# Patient Record
Sex: Male | Born: 1935 | Race: White | Hispanic: No | Marital: Married | Smoking: Former smoker
Health system: Southern US, Community
[De-identification: ages and names within clinical notes are randomized; demographics above are authoritative.]

## PROBLEM LIST (undated history)

## (undated) DIAGNOSIS — K56609 Unspecified intestinal obstruction, unspecified as to partial versus complete obstruction: Secondary | ICD-10-CM

## (undated) DIAGNOSIS — IMO0001 Reserved for inherently not codable concepts without codable children: Secondary | ICD-10-CM

## (undated) DIAGNOSIS — I10 Essential (primary) hypertension: Secondary | ICD-10-CM

## (undated) DIAGNOSIS — I2510 Atherosclerotic heart disease of native coronary artery without angina pectoris: Secondary | ICD-10-CM

## (undated) DIAGNOSIS — H9190 Unspecified hearing loss, unspecified ear: Secondary | ICD-10-CM

## (undated) DIAGNOSIS — Z87442 Personal history of urinary calculi: Secondary | ICD-10-CM

## (undated) DIAGNOSIS — M1990 Unspecified osteoarthritis, unspecified site: Secondary | ICD-10-CM

## (undated) DIAGNOSIS — I4891 Unspecified atrial fibrillation: Secondary | ICD-10-CM

## (undated) DIAGNOSIS — H269 Unspecified cataract: Secondary | ICD-10-CM

## (undated) DIAGNOSIS — C4490 Unspecified malignant neoplasm of skin, unspecified: Secondary | ICD-10-CM

## (undated) DIAGNOSIS — Z86010 Personal history of colonic polyps: Secondary | ICD-10-CM

## (undated) DIAGNOSIS — E785 Hyperlipidemia, unspecified: Secondary | ICD-10-CM

## (undated) DIAGNOSIS — E119 Type 2 diabetes mellitus without complications: Secondary | ICD-10-CM

## (undated) HISTORY — DX: Atherosclerotic heart disease of native coronary artery without angina pectoris: I25.10

## (undated) HISTORY — DX: Unspecified cataract: H26.9

## (undated) HISTORY — PX: BACK SURGERY: SHX140

## (undated) HISTORY — DX: Type 2 diabetes mellitus without complications: E11.9

## (undated) HISTORY — DX: Personal history of urinary calculi: Z87.442

## (undated) HISTORY — PX: COLONOSCOPY: SHX174

## (undated) HISTORY — DX: Unspecified hearing loss, unspecified ear: H91.90

## (undated) HISTORY — DX: Unspecified atrial fibrillation: I48.91

## (undated) HISTORY — DX: Personal history of colonic polyps: Z86.010

## (undated) HISTORY — DX: Hyperlipidemia, unspecified: E78.5

## (undated) HISTORY — DX: Essential (primary) hypertension: I10

## (undated) HISTORY — DX: Unspecified osteoarthritis, unspecified site: M19.90

## (undated) HISTORY — DX: Unspecified malignant neoplasm of skin, unspecified: C44.90

## (undated) HISTORY — PX: TOTAL HIP ARTHROPLASTY: SHX124

---

## 1979-11-17 HISTORY — PX: TYMPANIC MEMBRANE REPAIR: SHX294

## 1997-11-05 ENCOUNTER — Encounter: Payer: Self-pay | Admitting: Internal Medicine

## 1998-06-18 ENCOUNTER — Emergency Department (HOSPITAL_COMMUNITY): Admission: EM | Admit: 1998-06-18 | Discharge: 1998-06-18 | Payer: Self-pay | Admitting: *Deleted

## 1999-02-05 ENCOUNTER — Emergency Department (HOSPITAL_COMMUNITY): Admission: EM | Admit: 1999-02-05 | Discharge: 1999-02-05 | Payer: Self-pay | Admitting: *Deleted

## 1999-02-05 ENCOUNTER — Encounter: Payer: Self-pay | Admitting: *Deleted

## 1999-02-25 ENCOUNTER — Emergency Department (HOSPITAL_COMMUNITY): Admission: EM | Admit: 1999-02-25 | Discharge: 1999-02-25 | Payer: Self-pay | Admitting: Emergency Medicine

## 1999-02-28 ENCOUNTER — Inpatient Hospital Stay (HOSPITAL_COMMUNITY): Admission: RE | Admit: 1999-02-28 | Discharge: 1999-03-01 | Payer: Self-pay | Admitting: Urology

## 1999-03-01 ENCOUNTER — Encounter: Payer: Self-pay | Admitting: Urology

## 1999-11-03 ENCOUNTER — Encounter: Admission: RE | Admit: 1999-11-03 | Discharge: 2000-02-01 | Payer: Self-pay | Admitting: Internal Medicine

## 2000-07-14 ENCOUNTER — Encounter: Payer: Self-pay | Admitting: Internal Medicine

## 2000-07-15 ENCOUNTER — Other Ambulatory Visit: Admission: RE | Admit: 2000-07-15 | Discharge: 2000-07-15 | Payer: Self-pay | Admitting: Gastroenterology

## 2002-06-20 ENCOUNTER — Encounter: Admission: RE | Admit: 2002-06-20 | Discharge: 2002-06-20 | Payer: Self-pay | Admitting: Internal Medicine

## 2002-06-20 ENCOUNTER — Encounter: Payer: Self-pay | Admitting: Internal Medicine

## 2002-07-18 ENCOUNTER — Encounter: Payer: Self-pay | Admitting: Neurosurgery

## 2002-07-20 ENCOUNTER — Encounter: Payer: Self-pay | Admitting: Neurosurgery

## 2002-07-20 ENCOUNTER — Ambulatory Visit (HOSPITAL_COMMUNITY): Admission: RE | Admit: 2002-07-20 | Discharge: 2002-07-21 | Payer: Self-pay | Admitting: Neurosurgery

## 2004-06-10 ENCOUNTER — Encounter: Admission: RE | Admit: 2004-06-10 | Discharge: 2004-06-10 | Payer: Self-pay | Admitting: Orthopedic Surgery

## 2004-07-09 ENCOUNTER — Encounter: Payer: Self-pay | Admitting: Gastroenterology

## 2004-07-09 LAB — HM COLONOSCOPY

## 2004-10-20 ENCOUNTER — Ambulatory Visit: Payer: Self-pay | Admitting: Internal Medicine

## 2004-12-22 ENCOUNTER — Ambulatory Visit: Payer: Self-pay | Admitting: Internal Medicine

## 2005-07-02 ENCOUNTER — Ambulatory Visit: Payer: Self-pay | Admitting: Internal Medicine

## 2006-02-23 ENCOUNTER — Emergency Department (HOSPITAL_COMMUNITY): Admission: EM | Admit: 2006-02-23 | Discharge: 2006-02-23 | Payer: Self-pay | Admitting: Emergency Medicine

## 2006-03-17 ENCOUNTER — Encounter: Payer: Self-pay | Admitting: Urology

## 2006-04-30 ENCOUNTER — Ambulatory Visit: Payer: Self-pay | Admitting: Internal Medicine

## 2006-09-30 ENCOUNTER — Ambulatory Visit: Payer: Self-pay | Admitting: Internal Medicine

## 2006-11-05 ENCOUNTER — Ambulatory Visit: Payer: Self-pay | Admitting: Internal Medicine

## 2006-11-05 LAB — CONVERTED CEMR LAB
ALT: 20 units/L (ref 0–40)
AST: 21 units/L (ref 0–37)
Albumin: 3.9 g/dL (ref 3.5–5.2)
Alkaline Phosphatase: 46 units/L (ref 39–117)
BUN: 16 mg/dL (ref 6–23)
Basophils Absolute: 0 10*3/uL (ref 0.0–0.1)
Basophils Relative: 0.5 % (ref 0.0–1.0)
Bilirubin, Direct: 0.1 mg/dL (ref 0.0–0.3)
CO2: 27 meq/L (ref 19–32)
Calcium: 9.4 mg/dL (ref 8.4–10.5)
Chloride: 104 meq/L (ref 96–112)
Chol/HDL Ratio, serum: 3.6
Cholesterol: 144 mg/dL (ref 0–200)
Creatinine, Ser: 1 mg/dL (ref 0.4–1.5)
Eosinophil percent: 2.3 % (ref 0.0–5.0)
GFR calc non Af Amer: 79 mL/min
Glomerular Filtration Rate, Af Am: 95 mL/min/{1.73_m2}
Glucose, Bld: 120 mg/dL — ABNORMAL HIGH (ref 70–99)
HCT: 42.6 % (ref 39.0–52.0)
HDL: 40.1 mg/dL (ref >39.0)
Hemoglobin: 14.2 g/dL (ref 13.0–17.0)
Hgb A1c MFr Bld: 6.1 % — ABNORMAL HIGH (ref 4.6–6.0)
LDL Cholesterol: 88 mg/dL (ref 0–99)
Lymphocytes Relative: 33.2 % (ref 12.0–46.0)
MCHC: 33.3 g/dL (ref 30.0–36.0)
MCV: 95.1 fL (ref 78.0–100.0)
Monocytes Absolute: 0.5 10*3/uL (ref 0.2–0.7)
Monocytes Relative: 10.3 % (ref 3.0–11.0)
Neutro Abs: 2.7 10*3/uL (ref 1.4–7.7)
Neutrophils Relative %: 53.7 % (ref 43.0–77.0)
PSA: 1.17 ng/mL (ref 0.10–4.00)
Platelets: 294 10*3/uL (ref 150–400)
Potassium: 4 meq/L (ref 3.5–5.1)
RBC: 4.48 M/uL (ref 4.22–5.81)
RDW: 11.6 % (ref 11.5–14.6)
Sodium: 138 meq/L (ref 135–145)
TSH: 0.8 microintl units/mL (ref 0.35–5.50)
Total Bilirubin: 0.9 mg/dL (ref 0.3–1.2)
Total Protein: 6.9 g/dL (ref 6.0–8.3)
Triglyceride fasting, serum: 78 mg/dL (ref 0–149)
VLDL: 16 mg/dL (ref 0–40)
WBC: 5 10*3/uL (ref 4.5–10.5)

## 2007-04-15 ENCOUNTER — Inpatient Hospital Stay (HOSPITAL_COMMUNITY): Admission: RE | Admit: 2007-04-15 | Discharge: 2007-04-19 | Payer: Self-pay | Admitting: Orthopedic Surgery

## 2007-04-26 DIAGNOSIS — I10 Essential (primary) hypertension: Secondary | ICD-10-CM

## 2007-04-26 DIAGNOSIS — Z87442 Personal history of urinary calculi: Secondary | ICD-10-CM

## 2007-04-26 DIAGNOSIS — Z860100 Personal history of colon polyps, unspecified: Secondary | ICD-10-CM

## 2007-04-26 DIAGNOSIS — E119 Type 2 diabetes mellitus without complications: Secondary | ICD-10-CM

## 2007-04-26 HISTORY — DX: Personal history of colon polyps, unspecified: Z86.0100

## 2007-04-26 HISTORY — DX: Personal history of urinary calculi: Z87.442

## 2007-04-26 HISTORY — DX: Type 2 diabetes mellitus without complications: E11.9

## 2007-04-26 HISTORY — DX: Personal history of colonic polyps: Z86.010

## 2007-04-26 HISTORY — DX: Essential (primary) hypertension: I10

## 2007-05-12 ENCOUNTER — Ambulatory Visit: Payer: Self-pay | Admitting: Internal Medicine

## 2007-06-30 ENCOUNTER — Ambulatory Visit: Payer: Self-pay | Admitting: Internal Medicine

## 2007-07-01 LAB — CONVERTED CEMR LAB
BUN: 16 mg/dL (ref 6–23)
CO2: 29 meq/L (ref 19–32)
Calcium: 9.4 mg/dL (ref 8.4–10.5)
Chloride: 107 meq/L (ref 96–112)
Creatinine, Ser: 0.9 mg/dL (ref 0.4–1.5)
GFR calc Af Amer: 107 mL/min
GFR calc non Af Amer: 89 mL/min
Glucose, Bld: 120 mg/dL — ABNORMAL HIGH (ref 70–99)
Hgb A1c MFr Bld: 5.9 % (ref 4.6–6.0)
Potassium: 4.4 meq/L (ref 3.5–5.1)
Sodium: 141 meq/L (ref 135–145)

## 2007-09-14 ENCOUNTER — Ambulatory Visit: Payer: Self-pay | Admitting: Internal Medicine

## 2008-07-09 ENCOUNTER — Encounter: Payer: Self-pay | Admitting: Internal Medicine

## 2008-12-18 ENCOUNTER — Ambulatory Visit: Payer: Self-pay | Admitting: Internal Medicine

## 2008-12-18 DIAGNOSIS — H9190 Unspecified hearing loss, unspecified ear: Secondary | ICD-10-CM

## 2008-12-18 HISTORY — DX: Unspecified hearing loss, unspecified ear: H91.90

## 2008-12-19 LAB — CONVERTED CEMR LAB
ALT: 26 units/L (ref 0–53)
Alkaline Phosphatase: 47 units/L (ref 39–117)
Basophils Absolute: 0 10*3/uL (ref 0.0–0.1)
Basophils Relative: 0.6 % (ref 0.0–3.0)
Bilirubin, Direct: 0.1 mg/dL (ref 0.0–0.3)
Calcium: 9.3 mg/dL (ref 8.4–10.5)
Creatinine, Ser: 0.9 mg/dL (ref 0.4–1.5)
Eosinophils Absolute: 0.1 10*3/uL (ref 0.0–0.7)
Eosinophils Relative: 2.1 % (ref 0.0–5.0)
HCT: 43.2 % (ref 39.0–52.0)
HDL: 33.5 mg/dL — ABNORMAL LOW (ref >39.0)
LDL Cholesterol: 91 mg/dL (ref 0–99)
MCHC: 34.5 g/dL (ref 30.0–36.0)
MCV: 94.5 fL (ref 78.0–100.0)
Monocytes Absolute: 0.8 10*3/uL (ref 0.1–1.0)
Neutrophils Relative %: 57.8 % (ref 43.0–77.0)
Platelets: 275 10*3/uL (ref 150–400)
Sodium: 141 meq/L (ref 135–145)
Total Protein: 7.3 g/dL (ref 6.0–8.3)
Triglycerides: 120 mg/dL (ref 0–149)

## 2008-12-31 ENCOUNTER — Encounter: Payer: Self-pay | Admitting: Internal Medicine

## 2009-05-16 ENCOUNTER — Encounter (INDEPENDENT_AMBULATORY_CARE_PROVIDER_SITE_OTHER): Payer: Self-pay | Admitting: *Deleted

## 2009-06-13 ENCOUNTER — Ambulatory Visit: Payer: Self-pay | Admitting: Internal Medicine

## 2009-06-13 LAB — CONVERTED CEMR LAB
ALT: 21 units/L (ref 0–53)
AST: 22 units/L (ref 0–37)
Albumin: 3.8 g/dL (ref 3.5–5.2)
Alkaline Phosphatase: 42 units/L (ref 39–117)
Bilirubin, Direct: 0.2 mg/dL (ref 0.0–0.3)
Total Bilirubin: 0.9 mg/dL (ref 0.3–1.2)
Total CHOL/HDL Ratio: 4
Total Protein: 6.9 g/dL (ref 6.0–8.3)
Triglycerides: 90 mg/dL (ref 0.0–149.0)
VLDL: 18 mg/dL (ref 0.0–40.0)

## 2009-06-24 ENCOUNTER — Ambulatory Visit: Payer: Self-pay | Admitting: Gastroenterology

## 2009-07-01 ENCOUNTER — Ambulatory Visit: Payer: Self-pay | Admitting: Internal Medicine

## 2009-07-16 ENCOUNTER — Ambulatory Visit: Payer: Self-pay | Admitting: Gastroenterology

## 2009-07-16 ENCOUNTER — Encounter: Payer: Self-pay | Admitting: Gastroenterology

## 2009-07-18 ENCOUNTER — Encounter: Payer: Self-pay | Admitting: Gastroenterology

## 2009-12-25 ENCOUNTER — Ambulatory Visit: Payer: Self-pay | Admitting: Internal Medicine

## 2009-12-25 LAB — CONVERTED CEMR LAB
Albumin: 4 g/dL (ref 3.5–5.2)
Alkaline Phosphatase: 51 units/L (ref 39–117)
BUN: 18 mg/dL (ref 6–23)
CO2: 30 meq/L (ref 19–32)
Calcium: 9.3 mg/dL (ref 8.4–10.5)
Creatinine, Ser: 1 mg/dL (ref 0.4–1.5)
GFR calc non Af Amer: 77.87 mL/min (ref >60)
HDL: 45 mg/dL (ref >39.00)
Sodium: 140 meq/L (ref 135–145)
Total Bilirubin: 0.5 mg/dL (ref 0.3–1.2)
Total CHOL/HDL Ratio: 3

## 2010-01-07 ENCOUNTER — Ambulatory Visit: Payer: Self-pay | Admitting: Internal Medicine

## 2010-01-07 DIAGNOSIS — C4490 Unspecified malignant neoplasm of skin, unspecified: Secondary | ICD-10-CM | POA: Insufficient documentation

## 2010-01-07 DIAGNOSIS — D485 Neoplasm of uncertain behavior of skin: Secondary | ICD-10-CM | POA: Insufficient documentation

## 2010-01-07 HISTORY — DX: Unspecified malignant neoplasm of skin, unspecified: C44.90

## 2010-06-16 LAB — HM DIABETES FOOT EXAM: HM Diabetic Foot Exam: NORMAL

## 2010-07-01 ENCOUNTER — Ambulatory Visit: Payer: Self-pay | Admitting: Internal Medicine

## 2010-07-01 LAB — CONVERTED CEMR LAB
ALT: 22 units/L (ref 0–53)
Albumin: 4 g/dL (ref 3.5–5.2)
Alkaline Phosphatase: 43 units/L (ref 39–117)
CO2: 28 meq/L (ref 19–32)
Chloride: 107 meq/L (ref 96–112)
Cholesterol: 140 mg/dL (ref 0–200)
Creatinine, Ser: 0.9 mg/dL (ref 0.4–1.5)
Potassium: 4.2 meq/L (ref 3.5–5.1)
Total CHOL/HDL Ratio: 4

## 2010-07-08 ENCOUNTER — Ambulatory Visit: Payer: Self-pay | Admitting: Internal Medicine

## 2010-09-19 ENCOUNTER — Encounter: Payer: Self-pay | Admitting: Internal Medicine
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
## 2010-12-18 NOTE — Letter (Signed)
Summary: [facility]  [facility]   Imported By: Sherian Rein 10/15/2010 11:39:18  _____________________________________________________________________  External Attachment:    Type:   Image     Comment:   External Document
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
## 2010-12-26 ENCOUNTER — Other Ambulatory Visit (INDEPENDENT_AMBULATORY_CARE_PROVIDER_SITE_OTHER): Payer: Medicare Other | Admitting: Internal Medicine

## 2010-12-26 DIAGNOSIS — Z0000 Encounter for general adult medical examination without abnormal findings: Secondary | ICD-10-CM

## 2010-12-26 DIAGNOSIS — Z125 Encounter for screening for malignant neoplasm of prostate: Secondary | ICD-10-CM

## 2010-12-26 DIAGNOSIS — E119 Type 2 diabetes mellitus without complications: Secondary | ICD-10-CM

## 2010-12-26 DIAGNOSIS — I10 Essential (primary) hypertension: Secondary | ICD-10-CM

## 2010-12-26 LAB — CBC WITH DIFFERENTIAL/PLATELET
Eosinophils Absolute: 0.1 10*3/uL (ref 0.0–0.7)
HCT: 42.5 % (ref 39.0–52.0)
Hemoglobin: 14.8 g/dL (ref 13.0–17.0)
Lymphs Abs: 2 10*3/uL (ref 0.7–4.0)
MCHC: 34.7 g/dL (ref 30.0–36.0)
Monocytes Absolute: 0.5 10*3/uL (ref 0.1–1.0)
Monocytes Relative: 8.8 % (ref 3.0–12.0)
Neutro Abs: 3.3 10*3/uL (ref 1.4–7.7)
Neutrophils Relative %: 54.8 % (ref 43.0–77.0)
Platelets: 244 10*3/uL (ref 150.0–400.0)
RBC: 4.59 Mil/uL (ref 4.22–5.81)
RDW: 12.9 % (ref 11.5–14.6)

## 2010-12-26 LAB — POCT URINALYSIS DIPSTICK
Leukocytes, UA: NEGATIVE
Nitrite, UA: NEGATIVE
pH, UA: 6.5

## 2010-12-26 LAB — TSH: TSH: 1.32 u[IU]/mL (ref 0.35–5.50)

## 2010-12-26 LAB — HEPATIC FUNCTION PANEL
AST: 20 U/L (ref 0–37)
Alkaline Phosphatase: 42 U/L (ref 39–117)
Bilirubin, Direct: 0.1 mg/dL (ref 0.0–0.3)
Total Bilirubin: 0.7 mg/dL (ref 0.3–1.2)
Total Protein: 6.6 g/dL (ref 6.0–8.3)

## 2010-12-26 LAB — LIPID PANEL
HDL: 42 mg/dL (ref >39.00)
VLDL: 19.2 mg/dL (ref 0.0–40.0)

## 2010-12-26 LAB — BASIC METABOLIC PANEL
Calcium: 9.2 mg/dL (ref 8.4–10.5)
Creatinine, Ser: 0.9 mg/dL (ref 0.4–1.5)
GFR: 87.69 mL/min (ref >60.00)
Potassium: 4.5 mEq/L (ref 3.5–5.1)

## 2011-01-01 ENCOUNTER — Encounter: Payer: Self-pay | Admitting: Internal Medicine

## 2011-01-02 ENCOUNTER — Encounter: Payer: Self-pay | Admitting: Internal Medicine

## 2011-01-02 ENCOUNTER — Ambulatory Visit (INDEPENDENT_AMBULATORY_CARE_PROVIDER_SITE_OTHER): Payer: Medicare Other | Admitting: Internal Medicine

## 2011-01-02 DIAGNOSIS — I10 Essential (primary) hypertension: Secondary | ICD-10-CM

## 2011-01-02 DIAGNOSIS — E119 Type 2 diabetes mellitus without complications: Secondary | ICD-10-CM

## 2011-01-02 DIAGNOSIS — Z0000 Encounter for general adult medical examination without abnormal findings: Secondary | ICD-10-CM

## 2011-01-02 DIAGNOSIS — R079 Chest pain, unspecified: Secondary | ICD-10-CM | POA: Insufficient documentation

## 2011-01-02 MED ORDER — LISINOPRIL 20 MG PO TABS: 20.0000 mg | ORAL_TABLET | Freq: Every day | ORAL | Status: DC

## 2011-01-02 NOTE — Assessment & Plan Note (Signed)
Well controlled Continue same meds 

## 2011-01-02 NOTE — Progress Notes (Signed)
  Subjective:    Patient ID: [name], male    DOB: Jul 23, 1935, 76 y.o.   MRN: [id number]  HPI  cpx  Past Medical History  Diagnosis Date  . CARCINOMA, SKIN, SQUAMOUS CELL 01/07/2010  . COLONIC POLYPS, HX OF 04/26/2007  . DIABETES MELLITUS, TYPE II 04/26/2007  . HYPERTENSION 04/26/2007  . NEPHROLITHIASIS, HX OF 04/26/2007  . Unspecified hearing loss 12/18/2008   Past Surgical History  Procedure Date  . Tympanic membrane repair   . Back surgery   . Total hip arthroplasty     left on 04/15/07    reports that he quit smoking about 30 years ago. He does not have any smokeless tobacco history on file. He reports that he does not drink alcohol. His drug history not on file. family history includes Breast cancer in his sister; Cancer in his brother and sister; Heart disease in his brother and father; and Heart failure in his mother.      Review of Systems   patient denies chest pain, shortness of breath, orthopnea. Denies lower extremity edema, abdominal pain, change in appetite, change in bowel movements. Patient denies rashes, musculoskeletal complaints. No other specific complaints in a complete review of systems.      Objective:   Physical Exam  Well-developed male in no acute distress. HEENT exam atraumatic, normocephalic, extraocular muscles are intact. Conjunctivae are pink without exudate. Neck is supple without lymphadenopathy, thyromegaly, jugular venous distention. Chest is clear to auscultation without increased work of breathing. Cardiac exam S1-S2 are regular. The PMI is normal. No significant murmurs or gallops. Abdominal exam active bowel sounds, soft, nontender. No abdominal bruits. Extremities no clubbing cyanosis or edema. Peripheral pulses are normal without bruits. Neurologic exam alert and oriented without any motor or sensory deficits. Rectal exam normal tone prostate normal size without masses or asymmetry.        Assessment & Plan:

## 2011-01-02 NOTE — Assessment & Plan Note (Addendum)
New problem. Patient reports exertional chest discomfort with dyspnea. This is been ongoing for several months might be slightly worsening. He has multiple risk factors for coronary disease including age, hypertension, diet-controlled diabetes. He clearly needs further evaluation. We'll set up for a stress Myoview.  recent extensive evaluation.

## 2011-01-02 NOTE — Assessment & Plan Note (Signed)
Reasonable control on no meds No need for treatment at this time

## 2011-01-07 ENCOUNTER — Telehealth (INDEPENDENT_AMBULATORY_CARE_PROVIDER_SITE_OTHER): Payer: Self-pay | Admitting: Radiology

## 2011-01-08 ENCOUNTER — Encounter: Payer: Self-pay | Admitting: Cardiology

## 2011-01-08 ENCOUNTER — Encounter (HOSPITAL_COMMUNITY): Payer: Medicare Other

## 2011-01-08 ENCOUNTER — Ambulatory Visit (HOSPITAL_COMMUNITY): Payer: Medicare Other | Attending: Internal Medicine

## 2011-01-08 DIAGNOSIS — I4949 Other premature depolarization: Secondary | ICD-10-CM

## 2011-01-08 DIAGNOSIS — R079 Chest pain, unspecified: Secondary | ICD-10-CM | POA: Insufficient documentation

## 2011-01-08 DIAGNOSIS — R0989 Other specified symptoms and signs involving the circulatory and respiratory systems: Secondary | ICD-10-CM

## 2011-01-08 DIAGNOSIS — I491 Atrial premature depolarization: Secondary | ICD-10-CM

## 2011-01-09 ENCOUNTER — Institutional Professional Consult (permissible substitution) (INDEPENDENT_AMBULATORY_CARE_PROVIDER_SITE_OTHER): Payer: Medicare Other | Admitting: Internal Medicine

## 2011-01-09 ENCOUNTER — Encounter: Payer: Self-pay | Admitting: Internal Medicine

## 2011-01-09 ENCOUNTER — Other Ambulatory Visit: Payer: Self-pay | Admitting: Internal Medicine

## 2011-01-09 DIAGNOSIS — R079 Chest pain, unspecified: Secondary | ICD-10-CM

## 2011-01-09 DIAGNOSIS — R0789 Other chest pain: Secondary | ICD-10-CM

## 2011-01-09 LAB — PROTIME-INR
INR: 1 ratio (ref 0.8–1.0)
Prothrombin Time: 11.2 s (ref 10.2–12.4)

## 2011-01-09 LAB — CBC WITH DIFFERENTIAL/PLATELET
Basophils Absolute: 0 10*3/uL (ref 0.0–0.1)
Basophils Relative: 0.3 % (ref 0.0–3.0)
Eosinophils Absolute: 0.1 10*3/uL (ref 0.0–0.7)
Eosinophils Relative: 1.2 % (ref 0.0–5.0)
Lymphs Abs: 1.9 10*3/uL (ref 0.7–4.0)
MCHC: 35.1 g/dL (ref 30.0–36.0)
Monocytes Relative: 7.8 % (ref 3.0–12.0)
Neutrophils Relative %: 73.1 % (ref 43.0–77.0)
RDW: 12.9 % (ref 11.5–14.6)

## 2011-01-09 LAB — BASIC METABOLIC PANEL: Sodium: 139 mEq/L (ref 135–145)

## 2011-01-12 ENCOUNTER — Inpatient Hospital Stay (HOSPITAL_COMMUNITY): Payer: Medicare Other

## 2011-01-12 ENCOUNTER — Encounter: Payer: Self-pay | Admitting: Cardiovascular Disease

## 2011-01-12 ENCOUNTER — Inpatient Hospital Stay (HOSPITAL_COMMUNITY)
Admission: RE | Admit: 2011-01-12 | Discharge: 2011-01-19 | DRG: 234 | Disposition: A | Discharge status: Home / Self Care | Payer: Medicare Other | Source: Ambulatory Visit | Attending: Thoracic Surgery (Cardiothoracic Vascular Surgery) | Admitting: Thoracic Surgery (Cardiothoracic Vascular Surgery)

## 2011-01-12 DIAGNOSIS — I2510 Atherosclerotic heart disease of native coronary artery without angina pectoris: Secondary | ICD-10-CM

## 2011-01-12 DIAGNOSIS — D62 Acute posthemorrhagic anemia: Secondary | ICD-10-CM | POA: Diagnosis not present

## 2011-01-12 DIAGNOSIS — Z860100 Personal history of colon polyps, unspecified: Secondary | ICD-10-CM

## 2011-01-12 DIAGNOSIS — Z96649 Presence of unspecified artificial hip joint: Secondary | ICD-10-CM

## 2011-01-12 DIAGNOSIS — I4891 Unspecified atrial fibrillation: Secondary | ICD-10-CM | POA: Diagnosis not present

## 2011-01-12 DIAGNOSIS — Z01810 Encounter for preprocedural cardiovascular examination: Secondary | ICD-10-CM

## 2011-01-12 DIAGNOSIS — Z87891 Personal history of nicotine dependence: Secondary | ICD-10-CM

## 2011-01-12 DIAGNOSIS — E875 Hyperkalemia: Secondary | ICD-10-CM | POA: Diagnosis not present

## 2011-01-12 DIAGNOSIS — R197 Diarrhea, unspecified: Secondary | ICD-10-CM | POA: Diagnosis not present

## 2011-01-12 DIAGNOSIS — K5900 Constipation, unspecified: Secondary | ICD-10-CM | POA: Diagnosis not present

## 2011-01-12 DIAGNOSIS — I2582 Chronic total occlusion of coronary artery: Secondary | ICD-10-CM | POA: Diagnosis present

## 2011-01-12 DIAGNOSIS — K560 Paralytic ileus: Secondary | ICD-10-CM | POA: Diagnosis not present

## 2011-01-12 DIAGNOSIS — E119 Type 2 diabetes mellitus without complications: Secondary | ICD-10-CM | POA: Diagnosis present

## 2011-01-12 DIAGNOSIS — J9383 Other pneumothorax: Secondary | ICD-10-CM | POA: Diagnosis not present

## 2011-01-12 HISTORY — PX: CORONARY ARTERY BYPASS GRAFT: SHX141

## 2011-01-12 LAB — COMPREHENSIVE METABOLIC PANEL
BUN: 13 mg/dL (ref 6–23)
CO2: 27 mEq/L (ref 19–32)
Calcium: 8.8 mg/dL (ref 8.4–10.5)
Chloride: 104 mEq/L (ref 96–112)
Creatinine, Ser: 0.91 mg/dL (ref 0.4–1.5)
GFR calc non Af Amer: >60 mL/min (ref >60)
Glucose, Bld: 134 mg/dL — ABNORMAL HIGH (ref 70–99)
Total Bilirubin: 0.7 mg/dL (ref 0.3–1.2)

## 2011-01-12 LAB — URINALYSIS, ROUTINE W REFLEX MICROSCOPIC
Bilirubin Urine: NEGATIVE
Ketones, ur: NEGATIVE mg/dL
Nitrite: NEGATIVE
Protein, ur: NEGATIVE mg/dL
Urobilinogen, UA: 0.2 mg/dL (ref 0.0–1.0)
pH: 6.5 (ref 5.0–8.0)

## 2011-01-12 LAB — POCT I-STAT 3, ART BLOOD GAS (G3+)
Acid-base deficit: 1 mmol/L (ref 0.0–2.0)
O2 Saturation: 100 %
O2 Saturation: 95 %
O2 Saturation: 98 %
Patient temperature: 35.4
TCO2: 23 mmol/L (ref 0–100)
TCO2: 23 mmol/L (ref 0–100)
pCO2 arterial: 39.6 mmHg (ref 35.0–45.0)
pCO2 arterial: 38.4 mmHg (ref 35.0–45.0)
pO2, Arterial: 106 mmHg — ABNORMAL HIGH (ref 80.0–100.0)

## 2011-01-12 LAB — SURGICAL PCR SCREEN
MRSA, PCR: NEGATIVE
Staphylococcus aureus: NEGATIVE

## 2011-01-12 LAB — BLOOD GAS, ARTERIAL
O2 Saturation: 97 %
TCO2: 23.8 mmol/L (ref 0–100)
pCO2 arterial: 34.4 mmHg — ABNORMAL LOW (ref 35.0–45.0)
pH, Arterial: 7.436 (ref 7.350–7.450)
pO2, Arterial: 87.7 mmHg (ref 80.0–100.0)

## 2011-01-12 LAB — CBC
HCT: 40.3 % (ref 39.0–52.0)
MCH: 31.4 pg (ref 26.0–34.0)
MCH: 30.8 pg (ref 26.0–34.0)
MCHC: 35.2 g/dL (ref 30.0–36.0)
MCV: 89.2 fL (ref 78.0–100.0)
MCV: 88 fL (ref 78.0–100.0)
Platelets: 153 10*3/uL (ref 150–400)
RBC: 4.52 MIL/uL (ref 4.22–5.81)
RDW: 12.4 % (ref 11.5–15.5)

## 2011-01-12 LAB — LIPID PANEL
Triglycerides: 134 mg/dL (ref <150)
VLDL: 27 mg/dL (ref 0–40)

## 2011-01-12 LAB — HEMOGLOBIN A1C: Hgb A1c MFr Bld: 7.1 % — ABNORMAL HIGH (ref <5.7)

## 2011-01-12 LAB — HEMOGLOBIN AND HEMATOCRIT, BLOOD
HCT: 25.6 % — ABNORMAL LOW (ref 39.0–52.0)
Hemoglobin: 9.1 g/dL — ABNORMAL LOW (ref 13.0–17.0)

## 2011-01-12 LAB — PROTIME-INR: Prothrombin Time: 12.9 seconds (ref 11.6–15.2)

## 2011-01-12 LAB — POCT I-STAT 4, (NA,K, GLUC, HGB,HCT)
Glucose, Bld: 104 mg/dL — ABNORMAL HIGH (ref 70–99)
Glucose, Bld: 122 mg/dL — ABNORMAL HIGH (ref 70–99)
HCT: 26 % — ABNORMAL LOW (ref 39.0–52.0)
HCT: 26 % — ABNORMAL LOW (ref 39.0–52.0)
Hemoglobin: 12.9 g/dL — ABNORMAL LOW (ref 13.0–17.0)
Hemoglobin: 8.8 g/dL — ABNORMAL LOW (ref 13.0–17.0)
Potassium: 4.2 mEq/L (ref 3.5–5.1)
Potassium: 3.7 mEq/L (ref 3.5–5.1)
Sodium: 137 mEq/L (ref 135–145)
Sodium: 135 mEq/L (ref 135–145)
Sodium: 143 mEq/L (ref 135–145)

## 2011-01-12 LAB — GLUCOSE, CAPILLARY: Glucose-Capillary: 139 mg/dL — ABNORMAL HIGH (ref 70–99)

## 2011-01-12 LAB — ABO/RH: ABO/RH(D): A NEG

## 2011-01-12 LAB — APTT: aPTT: 31 seconds (ref 24–37)

## 2011-01-13 ENCOUNTER — Inpatient Hospital Stay (HOSPITAL_COMMUNITY): Payer: Medicare Other

## 2011-01-13 LAB — GLUCOSE, CAPILLARY
Glucose-Capillary: 144 mg/dL — ABNORMAL HIGH (ref 70–99)
Glucose-Capillary: 209 mg/dL — ABNORMAL HIGH (ref 70–99)
Glucose-Capillary: 187 mg/dL — ABNORMAL HIGH (ref 70–99)

## 2011-01-13 LAB — CBC
HCT: 32.1 % — ABNORMAL LOW (ref 39.0–52.0)
Hemoglobin: 11.4 g/dL — ABNORMAL LOW (ref 13.0–17.0)
MCH: 30.3 pg (ref 26.0–34.0)
MCHC: 35.4 g/dL (ref 30.0–36.0)
MCHC: 34.3 g/dL (ref 30.0–36.0)
MCV: 88.4 fL (ref 78.0–100.0)
Platelets: 176 10*3/uL (ref 150–400)
RDW: 12.3 % (ref 11.5–15.5)
RDW: 12.6 % (ref 11.5–15.5)

## 2011-01-13 LAB — BASIC METABOLIC PANEL
Calcium: 8.3 mg/dL — ABNORMAL LOW (ref 8.4–10.5)
GFR calc Af Amer: >60 mL/min (ref >60)
GFR calc non Af Amer: >60 mL/min (ref >60)
Potassium: 4.1 mEq/L (ref 3.5–5.1)
Sodium: 136 mEq/L (ref 135–145)

## 2011-01-13 LAB — CREATININE, SERUM: GFR calc non Af Amer: >60 mL/min (ref >60)

## 2011-01-13 NOTE — Progress Notes (Signed)
Summary: nuc pre-procedure  Phone Note Outgoing Call   Call placed by: Harlow Asa CNMT Call placed to: Patient Reason for Call: Confirm/change Appt Summary of Call: Left message with information on Myoview Information Sheet (see scanned document for details).      Nuclear Med Background Indications for Stress Test: Evaluation for Ischemia     Symptoms: Chest Pain with Exertion, DOE    Nuclear Pre-Procedure Cardiac Risk Factors: Family History - CAD, History of Smoking, Hypertension, NIDDM Height (in): 67 Tech Comments: Diet controlled

## 2011-01-13 NOTE — Assessment & Plan Note (Signed)
Summary: myoview/dx.chest pain/dr.swords per terri [id number]  Nuclear Med Background Indications for Stress Test: Evaluation for Ischemia     Symptoms: Chest Pain with Exertion, Chest Pressure with Exertion, DOE, Palpitations, SOB    Nuclear Pre-Procedure Cardiac Risk Factors: Family History - CAD, History of Smoking, Hypertension, NIDDM Caffeine/Decaff Intake: None NPO After: 7:30 AM Lungs: clear IV 0.9% NS with Angio Cath: 18g     IV Site: R Antecubital IV Started by: Stanton Kidney, EMT-P Chest Size (in) 46     Height (in): 67 Weight (lb): 222 BMI: 34.90 Tech Comments: The patient struggled to get through 3 minutes on the treadmill. He experienced chest pressure/pain with sob, and EKG changes.  Nuclear Med Study 1 or 2 day study:  1 day     Stress Test Type:  Stress Reading MD:  Willa Rough, MD     Referring MD:  B.Swords Resting Radionuclide:  Technetium 72m Tetrofosmin     Resting Radionuclide Dose:  10.8 mCi  Stress Radionuclide:  Technetium 4m Tetrofosmin     Stress Radionuclide Dose:  33 mCi   Stress Protocol Exercise Time (min):  3:00 min     Max HR:  136 bpm     Predicted Max HR:  147 bpm  Max Systolic BP: 151 mm Hg     Percent Max HR:  92.52 %     METS: 4.6 Rate Pressure Product:  14782    Stress Test Technologist:  Milana Na, EMT-P     Nuclear Technologist:  Domenic Polite, CNMT  Rest Procedure  Myocardial perfusion imaging was performed at rest 45 minutes following the intravenous administration of Technetium 20m Tetrofosmin.  Stress Procedure  The patient exercised for 3:00. The patient stopped due to fatigue, sob, and chest pain/pressure.  There were + significant ST-T wave changes and occ pvcs/pacs.  Technetium 30m Tetrofosmin was injected at peak exercise and myocardial perfusion imaging was performed after a brief delay.  QPS Raw Data Images:  Normal; no motion artifact; normal heart/lung ratio. Stress Images:  Mild decreased activity at the  apical cap. Rest Images:  Normal homogeneous uptake in all areas of the myocardium. Subtraction (SDS):  Reversibility at the apical cap Transient Ischemic Dilatation:  1.12  (Normal <1.22)  Lung/Heart Ratio:  .13  (Normal <0.45)  Quantitative Gated Spect Images QGS EDV:  102 ml QGS ESV:  50 ml QGS EF:  51 % QGS cine images:  Mild hypokinesis at the apex  Findings Abnormal      Overall Impression  Exercise Capacity: Limited BP Response: Normal blood pressure response. Clinical Symptoms: Chest pressure ECG Impression: Nonspecific ST changes with stress Overall Impression Comments: The patient exercised only 3 minutes. He had chest pressure and SOB. There were nonspecific ST changes. There is a small reversible defect at the aex. My impression is that this probably does represent some ischemia.

## 2011-01-13 NOTE — Letter (Signed)
Summary: Cardiac Catheterization Instructions- Main Lab  [facility], Main Office  [address]   [place], [place] [address]   Phone: [phone]  Fax: [phone]     01/09/2011 MRN: [id number]  [facility] [address] [place], [place]  [address]  [place]  Dear Mr. Parson,   You are scheduled for Cardiac Catheterization on               with Dr.            .  Please arrive at the [facility] at 530      a.m.Marland Kitchen on the day of your procedure.  1. DIET     _x___ Nothing to eat or drink after midnight except your medications with a sip of water.   3. MAKE SURE YOU TAKE YOUR ASPIRIN.  4. _____ DO NOT TAKE these medications before your procedure:         ________________________________________________________________________________      __x__ YOU MAY TAKE ALL of your remaining medications with a small amount of water.      ____ START NEW medications:     ________________________________________________________________________________      ____ Eilene Ghazi instructions:     ________________________________________________________________________________  5. Plan for one night stay - bring personal belongings (i.e. toothpaste, toothbrush, etc.)  6. Bring a current list of your medications and current insurance cards.  7. Must have a responsible person to drive you home.   8. Someone must be with yu for the first 24 hours after you arrive home.  9. Please wear clothes that are easy to get on and off and wear slip-on shoes.  *Special note: Every effort is made to have your procedure done on time.  Occasionally there are emergencies that present themselves at the hospital that may cause delays.  Please be patient if a delay does occur.  If you have any questions after you get home, please call the office at the number listed above.  Layne Benton, RN, BSN

## 2011-01-13 NOTE — Consult Note (Signed)
NAMESEAMUS, [name]           ACCOUNT NO.:  [id number]  MEDICAL RECORD NO.:  [id number]           PATIENT TYPE:  O  LOCATION:  [facility]                         FACILITY:  [facility]  PHYSICIAN:  Salvatore Decent. Cornelius Moras, M.D. DATE OF BIRTH:  10-20-1935  DATE OF CONSULTATION:  01/12/2011 DATE OF DISCHARGE:                                CONSULTATION   REQUESTING PHYSICIAN:  Verne Carrow, MD  PRIMARY CARDIOLOGIST:  Pricilla Riffle, MD, [facility]  PRIMARY CARE PHYSICIAN:  Valetta Mole. Swords, MD  REASON FOR CONSULTATION:  Critical left main coronary artery disease.  HISTORY OF PRESENT ILLNESS:  [name] is a 76 year old gentleman from [place] with no previous history of coronary artery disease, but risk factors notable for history of hypertension and a remote history of tobacco use.  The patient describes a 1-year history of progressive symptoms of exertional angina.  He denies any rest pain other than occasional rare episodes of very transient mild discomfort.  Symptoms are characterized as tightness across his chest that radiates to the neck and is associated with shortness of breath.  Symptoms are always relieved within a minute or two of rest.  He was referred for a stress test that was notable in that the patient could only walk 3 minutes before he developed chest pain.  There were no EKG changes, but Myoview scan revealed apical ischemia.  The patient was brought in for elective cardiac catheterization this morning by Dr. Clifton James.  Catheterization demonstrates critical subtotal occlusion of the left main coronary artery.  There was normal left ventricular systolic function with mild hypokinesis and ejection fraction estimated at 45%.  Cardiothoracic Surgical consultation was requested.  REVIEW OF SYSTEMS:  GENERAL:  The patient reports normal appetite.  He has not been gaining nor losing weight recently.  CARDIAC:  The patient describes classical symptoms of exertional angina  as noted.  The patient denies nocturnal angina.  The patient denies resting shortness of breath, PND, orthopnea, or lower extremity edema.  The patient has not had tachy palpitations or syncope.  RESPIRATORY:  Notable only for intermittent cough that is occasionally productive of a small amount of whitish phlegm.  The patient denies any purulent sputum production or hemoptysis.  GASTROINTESTINAL:  Notable for occasional symptoms of reflux and mild difficulty swallowing.  The patient has never had any severe difficulty swallowing.  He reports normal bowel function and denies hematochezia, hematemesis, melena.  MUSCULOSKELETAL:  Notable for mild arthritis and arthralgias that do not seem to slow him down much. NEUROLOGIC:  Negative.  The patient denies symptoms suggestive of previous TIA or stroke.  GENITOURINARY:  Negative.  HEENT:  Negative. INFECTIOUS:  Negative.  PAST MEDICAL HISTORY: 1. Hypertension. 2. Type 2 diabetes mellitus, diet controlled. 3. Nephrolithiasis. 4. Benign colon polyps.  PAST SURGICAL HISTORY: 1. Left total hip replacement. 2. Lumbar laminectomy and diskectomy. 3. Repair of ruptured tympanic membrane in the distant past.  FAMILY HISTORY:  Noncontributory.  Notable for the absence of premature coronary artery disease.  SOCIAL HISTORY:  The patient is married and lives with his wife here in [place].  He is retired.  He has a remote  history of tobacco use, but he quit smoking 30 years ago.  He does not use alcohol.  MEDICATIONS PRIOR TO ADMISSION: 1. Aspirin 81 mg daily. 2. Lisinopril 20 mg daily. 3. Triamcinolone acetonide cream apply to affected areas as needed.  DRUG ALLERGIES:  None known.  PHYSICAL EXAMINATION:  GENERAL:  The patient is well-appearing, moderately obese male, who appears his stated age in no acute distress. HEENT:  Unrevealing. NECK:  Supple.  There is no cervical nor supraclavicular lymphadenopathy.  There is no jugular venous  distention.  No carotid bruits are noted. CHEST:  Auscultation of the chest demonstrates clear breath sounds that are symmetrical anteriorly.  No wheezes, rales, or rhonchi are noted. CARDIOVASCULAR:  Regular rate and rhythm.  No murmurs, rubs, or gallops are noted.  ABDOMEN:  Soft, nondistended, nontender.  Bowel sounds present. EXTREMITIES:  Warm and well perfused.  There is no lower extremity edema.  Distal pulses are palpable, but slightly diminished in the posterior tibial position.  There is no sign of venous insufficiency. RECTAL AND GU:  Both deferred. NEUROLOGIC:  Grossly nonfocal.  DIAGNOSTIC TESTS:  Cardiac catheterization performed by Dr. Clifton James earlier today is reviewed.  This demonstrates subtotal occlusion of the left main coronary artery, which is heavily calcified.  There is right dominant coronary circulation.  There is no significant disease in the right coronary circulation.  There is mild left ventricular dysfunction with ejection fraction estimated at 45%.  IMPRESSION:  Critical left main disease with stable symptoms of exertional angina.  PLAN:  We have planned to proceed directly with coronary artery bypassgrafting later today.  I have discussed the indications, risks, and potential benefits of surgery with [name] and his family.  They understand and accept all associated risks of surgery including but not limited to risk of death, stroke, myocardial infarction, congestive heart failure, respiratory failure, renal failure, pneumonia, bleeding requiring blood transfusion, arrhythmia, infection, late recurrence of coronary artery disease.  All their questions have been addressed.     Salvatore Decent. Cornelius Moras, M.D.     CHO/MEDQ  D:  01/12/2011  T:  01/12/2011  Job:  [id number]  cc:   Pricilla Riffle, MD, [facility] Verne Carrow, MD Valetta Mole. Swords, MD  Electronically Signed by Tressie Stalker M.D. on 01/13/2011 06:46:17 AM

## 2011-01-13 NOTE — Op Note (Signed)
[name], [name]           ACCOUNT NO.:  [id number]  MEDICAL RECORD NO.:  [id number]           PATIENT TYPE:  I  LOCATION:  2308                         FACILITY:  [facility]  PHYSICIAN:  Salvatore Decent. Cornelius Moras, M.D. DATE OF BIRTH:  1935-07-20  DATE OF PROCEDURE:  01/12/2011 DATE OF DISCHARGE:                              OPERATIVE REPORT   PREOPERATIVE DIAGNOSIS:  Critical left main coronary artery disease.  POSTOPERATIVE DIAGNOSIS:  Critical left main coronary artery disease.  PROCEDURES:  Median sternotomy for coronary artery bypass grafting x2 (left internal mammary artery to distal left anterior descending coronary artery, saphenous vein graft to left circumflex coronary artery, and saphenous vein harvest from right thigh).  SURGEON:  Salvatore Decent. Cornelius Moras, MD  ASSISTANT:  Stephanie Acre Dasovich, PA  ANESTHESIA:  Burna Forts, MD  BRIEF CLINICAL NOTE:  The patient is a 76 year old gentleman with no previous history of coronary artery disease, but risk factors notable for history of hypertension and borderline type 2 diabetes mellitus. The patient presents with exertional angina.  Cardiac catheterization demonstrates critical subtotal occlusion of the left main coronary artery with mild left ventricular dysfunction.  A full consultation note has been dictated previously.  The patient is brought directly to the operating room for urgent surgical revascularization based upon critical coronary anatomy.  The patient and his family understand and accept all associated risks of surgery and desired to proceed as described.  OPERATIVE FINDINGS: 1. Mild left ventricular dysfunction with ejection fraction estimated     at 50%. 2. Good-quality left internal mammary artery and saphenous vein     conduit for grafting. 3. Small-caliber, but good-quality target vessels for grafting.  OPERATIVE PROCEDURE IN DETAIL:  The patient was brought to the operating room on the above-mentioned date  and placed in the supine position on the operating table.  General endotracheal anesthesia was induced uneventfully under the care and direction of Dr. Sharee Holster. Central monitoring was established with placement of a Swan-Ganz catheter through the right internal jugular approach.  A radial arterial line was placed.  Intravenous antibiotics were administered.  Foley catheter was placed.  Baseline transesophageal echocardiogram was performed by Dr. Jacklynn Bue.  This demonstrates mild left ventricular dysfunction with no other significant findings.  The patient's chest, abdomen, both groins, and both lower extremities were prepared and draped in a sterile manner.  Median sternotomy incision was performed and the left internal mammary artery was dissected from the chest wall and prepared for bypass grafting.  The left internal mammary artery is good-quality conduit. Simultaneously saphenous vein was obtained to the patient's right thigh using endoscopic vein harvest technique through a small incision made just above the right knee.  The saphenous vein is good-quality conduit. After the saphenous vein has been removed, the small incisions in the right extremity are closed with absorbable suture.  Following systemic heparinization, the left internal mammary artery was transected distally and noted to have excellent flow.  The pericardium was opened.  The ascending aorta was normal in appearance.  The ascending aorta and the right atrium were cannulated for cardiopulmonary bypass.  Cardiopulmonary bypass was begun and the surface  of the heart was inspected.  Distal target vessels were selected for coronary bypass grafting.  A cardioplegic cannula was placed directly in the ascending aorta.  The patient was allowed to cool passively to 32 degrees systemic temperature.  The aortic crossclamp was applied and cold blood cardioplegia was delivered in antegrade fashion through the aortic root.   Iced saline slush was applied for topical hypothermia.  The initial cardioplegic arrest and myocardial cooling were notably excellent.  Repeat doses of cardioplegia were administered intermittently throughout the crossclamp portion of the operation through the aortic root and down the subsequently placed vein graft to maintain completely flat electrocardiogram and septal myocardial temperature below 15 degrees centigrade.  The following distal coronary anastomoses were performed: 1. The obtuse marginal branch of the left circumflex coronary artery     is grafted with a saphenous vein graft in end-to-side fashion.     This vessel measured 1.5 mm in diameter and is a good-quality     target vessel for grafting. 2. The distal left anterior descending coronary artery is grafted with     a left internal mammary artery in an end-to-side fashion.  This     vessel measured 1.7 mm in diameter and is a good-quality target     vessel for grafting.  The single proximal saphenous vein anastomoses was performed directly to the ascending aorta prior to removal of the aortic crossclamp.  The left ventricular septal temperature rises rapidly with reperfusion of the left internal mammary artery graft.  The aortic crossclamp was removed after a total crossclamp time of 38 minutes.  The heart defibrillated and normal sinus rhythm resumed.  All proximal and distal coronary anastomoses were inspected for hemostasis and appropriate graft orientation.  Epicardial pacing wires were fixed to the right ventricular free wall and to the right atrial appendage.  The patient was rewarmed to 37 degrees centigrade temperature.  The patient was weaned from cardiopulmonary bypass without difficulty.  The patient's rhythm at separation from bypass is normal sinus rhythm.  No inotropic support was required.  Total cardiopulmonary bypass time of the operation was 52 minutes.  The venous and arterial cannulae are removed  uneventfully.  Protamine was administered to reverse the anticoagulation.  The mediastinum and the left pleural space were irrigated with saline solution.  Meticulous surgical hemostasis was ascertained.  The mediastinum and the left pleural space were drained with three chest tubes placed through separate stab incisions inferiorly.  The pericardium and soft tissues anterior to the aorta were reapproximated loosely.  The sternum was closed using double-strength sternal wire.  The soft tissues anterior to the sternum were closed in multiple layers and the skin was closed with running subcuticular skin closure.  The patient tolerated the procedure well and was transported to surgical intensive care unit in stable condition.  There are no intraoperative complications.  All sponge, instrument, and needle counts verified as correct at completion of the operation.  No blood products were administered.     Salvatore Decent. Cornelius Moras, M.D.     CHO/MEDQ  D:  01/12/2011  T:  01/13/2011  Job:  [id number]  cc:   Verne Carrow, MD Pricilla Riffle, MD, [facility] Bruce H. Swords, MD  Electronically Signed by Tressie Stalker M.D. on 01/13/2011 06:46:19 AM
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
## 2011-01-14 ENCOUNTER — Inpatient Hospital Stay (HOSPITAL_COMMUNITY): Payer: Medicare Other

## 2011-01-14 LAB — GLUCOSE, CAPILLARY
Glucose-Capillary: 190 mg/dL — ABNORMAL HIGH (ref 70–99)
Glucose-Capillary: 204 mg/dL — ABNORMAL HIGH (ref 70–99)

## 2011-01-14 LAB — BASIC METABOLIC PANEL
Chloride: 102 mEq/L (ref 96–112)
Creatinine, Ser: 1.11 mg/dL (ref 0.4–1.5)
GFR calc Af Amer: >60 mL/min (ref >60)
Potassium: 3.9 mEq/L (ref 3.5–5.1)
Sodium: 132 mEq/L — ABNORMAL LOW (ref 135–145)

## 2011-01-14 LAB — CBC
Hemoglobin: 10.8 g/dL — ABNORMAL LOW (ref 13.0–17.0)
MCV: 88.1 fL (ref 78.0–100.0)
Platelets: 179 10*3/uL (ref 150–400)
RBC: 3.6 MIL/uL — ABNORMAL LOW (ref 4.22–5.81)
WBC: 12.2 10*3/uL — ABNORMAL HIGH (ref 4.0–10.5)

## 2011-01-14 NOTE — Procedures (Signed)
[name], [name]           ACCOUNT NO.:  [id number]  MEDICAL RECORD NO.:  [id number]           PATIENT TYPE:  I  LOCATION:  2308                         FACILITY:  [facility]  PHYSICIAN:  Verne Carrow, MDDATE OF BIRTH:  [birth year]  DATE OF PROCEDURE:  01/12/2011 DATE OF DISCHARGE:                           CARDIAC CATHETERIZATION   PRIMARY CARE PHYSICIAN:  Valetta Mole. Swords, MD.  PRIMARY CARDIOLOGIST:  Pricilla Riffle, MD, [facility].  PROCEDURE PERFORMED: 1. Left heart catheterization. 2. Selective coronary angiography. 3. Left ventricular angiogram.  OPERATOR:  Verne Carrow, MD.  INDICATIONS:  Chest pain in a 76 year old Caucasian male with a history of hypertension and diet-controlled diabetes mellitus, who recently underwent an outpatient stress Myoview, which showed apical ischemia. The patient had chest pain with exercise.  He was seen by Dr. Tenny Craw in the office and was referred for a diagnostic left heart catheterization.  DETAILS OF PROCEDURE:  The patient was brought into the main cardiac catheterization laboratory as an outpatient.  An Freida Busman test was performed on the right wrist and was positive.  The right wrist was prepped and draped in sterile fashion.  A 1% lidocaine was used for local anesthesia.  A 5-French sheath was inserted into the right radial artery without difficulty.  A 3 mg of verapamil was given through the sheath after insertion, 5000 units of intravenous heparin was given after sheath insertion.  Standard diagnostic catheters were used to perform selective coronary angiography.  A pigtail catheter was used to perform a left ventricular angiogram.  The patient tolerated the diagnostic portion of procedure without any complications.  The sheath was removed from the right radial artery and Terumo hemostasis band was applied over the arteriotomy site.  The patient was taken to the recovery area in stable condition.  HEMODYNAMIC FINDINGS:   Central aortic pressure 109/51.  Left ventricular pressure 107/6.  Left ventricular end-diastolic pressure 9.  ANGIOGRAPHIC FINDINGS: 1. The left main coronary artery had a distal 99% stenosis with     calcification. 2. The left anterior descending is a large-caliber vessel that courses     to the apex and wraps around the apex.  There are 2 small-to-     moderate sized diagonal branches that had plaque disease.  The     ostium of the left anterior descending artery has a 99% calcified     stenosis just after the takeoff from the left main artery.  The     proximal vessel has a tubular 60% stenosis. 3. Circumflex artery has an ostial 99% stenosis.  This area is heavily     calcified.  This system is mainly composed of an obtuse marginal     branch which has a 50% mid stenosis. 4. The right coronary artery is a large dominant vessel with a 30%     calcified mid stenosis. 5. Left ventricular angiogram was performed in the RAO projection     showed ejection fraction of 45% with hypokinesis of the apex.  IMPRESSION: 1. Left main disease. 2. Severe double-vessel coronary artery disease involving the ostium     of the circumflex artery and ostium of the  left anterior descending     artery. 3. Mild left ventricular systolic dysfunction.  RECOMMENDATIONS:  This patient has disease that will be best approached with surgical revascularization.  We will admit him to the CCU and start a heparin drip 6 hours after sheath pull.  We will continue aspirin 325 mg once daily.  We will place a consultation to Cardiothoracic Surgery and get their opinion in regards to possible coronary artery bypass grafting surgery.  We will also start the patient on high-dose statin and a low dose of beta-blocker.     Verne Carrow, MD     CM/MEDQ  D:  01/12/2011  T:  01/12/2011  Job:  [id number]  cc:   Pricilla Riffle, MD, [facility] Bruce H. Swords, MD  Electronically Signed by Verne Carrow MD on  01/14/2011 10:58:02 AM

## 2011-01-14 NOTE — Op Note (Signed)
NAMETOBEN, ACUNA           ACCOUNT NO.:  [id number]  MEDICAL RECORD NO.:  [id number]           PATIENT TYPE:  I  LOCATION:  2016                         FACILITY:  [facility]  PHYSICIAN:  Burna Forts, M.D.DATE OF BIRTH:  05/29/1935  DATE OF PROCEDURE:  01/13/2011 DATE OF DISCHARGE:                              OPERATIVE REPORT   INDICATION FOR PROCEDURE:  Mr. Heckmann is a 76 year old gentleman, who was recently in the cardiac cath area this morning whereupon it was discovered he had severe three-vessel coronary artery disease and felt by his surgeon, Dr. Tressie Stalker that it was of a degree that will necessitate emergency coronary artery bypass grafting.  Subsequently, he was moved to the OR holding area, where under local anesthesia sedation, the pulmonary artery lines and radial arterial lines were put in place. He was then taken to OR for routine induction of general anesthesia after which the TEE probe is prepared and passed oropharyngeally into the stomach then slightly withdrawn for imaging of the cardiac structures.  PRE-CARDIOPULMONARY BYPASS TEE EXAM:  Left ventricle:  A mid view of the short-axis view of the left ventricular chamber shows moderate LVH. There is overall diminution of the left ventricular contractile pattern at the level of a mid papillary muscle short-axis view.  Short-axis and long-axis views are obtained with a suggestion of hypokinesis in the anterior wall and the apical wall.  The mid esophageal long-axis anterior wall view is also obtained which again shows a fairly stunned somewhat hypokinetic anterior wall going all way down into the apical area of the heart.  This is most pronounced in this long-axis view. Papillary muscles were well outlined.  No other masses were appreciated.  Mitral valve:  Mitral valve area is seen in a four-chamber view.  Mitral valve leaflets are mildly thickened, but appeared to be overall normal in their  function and coaptation.  Color Doppler reveals trivial to mild mitral regurgitant flow across this mitral valve apparatus.  There is no obstruction to the diastolic inflow.  The pulse, blood pressures did suggest there was increased left ventricular end-diastolic pressures were appreciated.  Right ventricle:  Tricuspid valve and right atrium are normal chambers and structures.  Left atrium.  This is a normal left atrial chamber.  The interatrial septum is interrogated.  The patient is placed on cardiopulmonary bypass.  Coronary artery bypass grafting was carried out.  The patient was separated and prepared for separation from cardiopulmonary bypass with the initial attempt.  POSTCARDIOPULMONARY BYPASS TEE EXAM:  (Directed exam.)  Left ventricle:  In the early bypass, there appears to be improved overall contractile pattern now seen in a short-axis view of this left ventricular chamber.  Multiple views are carried out again both short- axis and long-axis as well as the mid esophageal long-axis which illustrates as anterior wall.  The anterior wall does appear to be improved in the post bypass.  Though there does appear to be remnant hypocontractility in the apical area of the heart.  The rest of the cardiac examination was unchanged.  The mitral valve remained trivial.  Regurgitant flow remained trivial and the rest of the  cardiac examination as mentioned was as previously described.  The patient was then returned to the Cardiac Intensive Care Unit in stable condition.          ______________________________ Burna Forts, M.D.     JTM/MEDQ  D:  01/13/2011  T:  01/14/2011  Job:  [id number]  Electronically Signed by Ester Rink M.D. on 01/14/2011 06:02:43 AM

## 2011-01-15 ENCOUNTER — Inpatient Hospital Stay (HOSPITAL_COMMUNITY): Payer: Medicare Other

## 2011-01-15 LAB — PROTIME-INR
INR: 1.13 (ref 0.00–1.49)
Prothrombin Time: 14.7 seconds (ref 11.6–15.2)

## 2011-01-15 LAB — GLUCOSE, CAPILLARY: Glucose-Capillary: 174 mg/dL — ABNORMAL HIGH (ref 70–99)

## 2011-01-15 LAB — BASIC METABOLIC PANEL
CO2: 26 mEq/L (ref 19–32)
Calcium: 8.4 mg/dL (ref 8.4–10.5)
Creatinine, Ser: 0.9 mg/dL (ref 0.4–1.5)
GFR calc Af Amer: >60 mL/min (ref >60)
Glucose, Bld: 171 mg/dL — ABNORMAL HIGH (ref 70–99)

## 2011-01-16 ENCOUNTER — Inpatient Hospital Stay (HOSPITAL_COMMUNITY): Payer: Medicare Other

## 2011-01-16 LAB — COMPREHENSIVE METABOLIC PANEL
Albumin: 2.8 g/dL — ABNORMAL LOW (ref 3.5–5.2)
Alkaline Phosphatase: 47 U/L (ref 39–117)
BUN: 19 mg/dL (ref 6–23)
CO2: 27 mEq/L (ref 19–32)
Chloride: 98 mEq/L (ref 96–112)
Creatinine, Ser: 1.04 mg/dL (ref 0.4–1.5)
GFR calc non Af Amer: >60 mL/min (ref >60)
Glucose, Bld: 154 mg/dL — ABNORMAL HIGH (ref 70–99)
Potassium: 3.7 mEq/L (ref 3.5–5.1)
Total Bilirubin: 0.7 mg/dL (ref 0.3–1.2)

## 2011-01-16 LAB — CBC
HCT: 34 % — ABNORMAL LOW (ref 39.0–52.0)
MCH: 30.9 pg (ref 26.0–34.0)
MCHC: 34.7 g/dL (ref 30.0–36.0)
MCV: 89 fL (ref 78.0–100.0)
Platelets: 245 10*3/uL (ref 150–400)
RDW: 12.8 % (ref 11.5–15.5)

## 2011-01-16 LAB — CROSSMATCH
ABO/RH(D): A NEG
Antibody Screen: NEGATIVE
Unit division: 0

## 2011-01-16 LAB — GLUCOSE, CAPILLARY
Glucose-Capillary: 147 mg/dL — ABNORMAL HIGH (ref 70–99)
Glucose-Capillary: 146 mg/dL — ABNORMAL HIGH (ref 70–99)

## 2011-01-16 LAB — LIPASE, BLOOD: Lipase: 21 U/L (ref 11–59)

## 2011-01-17 LAB — PROTIME-INR
INR: 1.17 (ref 0.00–1.49)
Prothrombin Time: 15.1 seconds (ref 11.6–15.2)

## 2011-01-17 LAB — BASIC METABOLIC PANEL
BUN: 22 mg/dL (ref 6–23)
Creatinine, Ser: 1.03 mg/dL (ref 0.4–1.5)
GFR calc Af Amer: >60 mL/min (ref >60)
GFR calc non Af Amer: >60 mL/min (ref >60)
Potassium: 3.3 mEq/L — ABNORMAL LOW (ref 3.5–5.1)

## 2011-01-17 LAB — CBC
Hemoglobin: 11.6 g/dL — ABNORMAL LOW (ref 13.0–17.0)
MCHC: 34.4 g/dL (ref 30.0–36.0)
WBC: 10.8 10*3/uL — ABNORMAL HIGH (ref 4.0–10.5)

## 2011-01-17 LAB — URINALYSIS, ROUTINE W REFLEX MICROSCOPIC
Bilirubin Urine: NEGATIVE
Ketones, ur: NEGATIVE mg/dL
Nitrite: NEGATIVE
Specific Gravity, Urine: 1.021 (ref 1.005–1.030)
Urobilinogen, UA: 0.2 mg/dL (ref 0.0–1.0)

## 2011-01-17 LAB — GLUCOSE, CAPILLARY: Glucose-Capillary: 143 mg/dL — ABNORMAL HIGH (ref 70–99)

## 2011-01-18 LAB — BASIC METABOLIC PANEL
CO2: 28 mEq/L (ref 19–32)
Chloride: 102 mEq/L (ref 96–112)
GFR calc Af Amer: >60 mL/min (ref >60)
Sodium: 141 mEq/L (ref 135–145)

## 2011-01-19 LAB — BASIC METABOLIC PANEL
CO2: 29 mEq/L (ref 19–32)
Chloride: 105 mEq/L (ref 96–112)
Glucose, Bld: 150 mg/dL — ABNORMAL HIGH (ref 70–99)
Potassium: 4.1 mEq/L (ref 3.5–5.1)
Sodium: 141 mEq/L (ref 135–145)

## 2011-01-19 LAB — GLUCOSE, CAPILLARY: Glucose-Capillary: 152 mg/dL — ABNORMAL HIGH (ref 70–99)

## 2011-01-19 LAB — URINE CULTURE: Colony Count: 15000

## 2011-01-20 ENCOUNTER — Encounter: Payer: Self-pay | Admitting: Internal Medicine

## 2011-01-20 DIAGNOSIS — I4891 Unspecified atrial fibrillation: Secondary | ICD-10-CM

## 2011-01-21 ENCOUNTER — Encounter: Payer: Self-pay | Admitting: Internal Medicine

## 2011-01-21 ENCOUNTER — Encounter (INDEPENDENT_AMBULATORY_CARE_PROVIDER_SITE_OTHER): Payer: Medicare Other

## 2011-01-21 DIAGNOSIS — Z7901 Long term (current) use of anticoagulants: Secondary | ICD-10-CM

## 2011-01-21 DIAGNOSIS — I4891 Unspecified atrial fibrillation: Secondary | ICD-10-CM

## 2011-01-21 LAB — CONVERTED CEMR LAB: POC INR: 1.4

## 2011-01-22 NOTE — Discharge Summary (Addendum)
  [name], BARTNICK           ACCOUNT NO.:  [id number]  MEDICAL RECORD NO.:  [id number]           PATIENT TYPE:  LOCATION:                                 FACILITY:  PHYSICIAN:  Salvatore Decent. Cornelius Moras, M.D. DATE OF BIRTH:  Mar 07, 1935  DATE OF ADMISSION: DATE OF DISCHARGE:                              DISCHARGE SUMMARY   ADDENDUM  The patient was dictated in anticipation, discharged to home over the weekend.  On January 17, 2011, postop day #5, the patient was noted to be back in atrial fibrillation with heart rate in the 120s.  He was continued on his p.o. Lopressor, amiodarone, and Coumadin and given an extra bolus of IV amiodarone.  The patient was able to convert back to normal sinus rhythm.  The patient currently has maintained normal sinus rhythm since then.  During this time, blood pressure monitored and has remained stable.  On the morning of January 17, 2011, the patient's potassium was noted to be low at 3.3, this was replaced and following day was stable at 3.6.  The patient was continued on his Coumadin and daily PT/INR levels were obtained.  Coumadin was adjusted appropriately and most recent INR was 1.26 on postop day #6.  The patient's ileus continued to improve.  He was tolerating regular diet well.  No nausea or vomiting noted.  His abdominal distention had improved.  Abdomen was soft with good bowel sounds by postop day #6.  Bowels were functioning properly.  He did have a temperature on January 17, 2011, of 100.2 in the a.m.  Urinalysis was ordered and noted to be negative.  This was monitored.  The patient since that time has remained afebrile.  The patient was continued on diuretics for volume overload.  He continued to ambulate well with assistance.  All incisions noted to be clean, dry, and intact and healing well.  The patient is tentatively ready for discharge to home in the a.m. postop day #7, January 19, 2011, pending, he remained stable.  Please see dictated  discharge summary for followup appointments and discharge instructions as well as discharge medications.     Sol Blazing, PA   ______________________________ Salvatore Decent. Cornelius Moras, M.D.    KMD/MEDQ  D:  01/18/2011  T:  01/18/2011  Job:  [id number]  cc:   Pricilla Riffle, MD, [facility] Corinna L. Lendell Caprice, MD  Electronically Signed by Cameron Proud PA on 01/20/2011 12:44:26 PM Electronically Signed by Tressie Stalker M.D. on 01/21/2011 03:37:05 PM Electronically Signed by Tressie Stalker M.D. on 02/02/2011 12:51:56 PM

## 2011-01-27 NOTE — Consult Note (Signed)
Summary: [facility] Consultation Report  [facility] Consultation Report   Imported By: Earl Many 01/23/2011 10:23:43  _____________________________________________________________________  External Attachment:    Type:   Image     Comment:   External Document

## 2011-01-27 NOTE — Medication Information (Addendum)
Summary: new pt coumadin check per pt's dc papers/mt  Anticoagulant Therapy  Managed by: Windell Hummingbird, RN Referring MD: Dietrich Pates Supervising MD: Graciela Husbands MD, Viviann Spare Indication 1: Atrial Fibrillation INR POC 1.4 INR RANGE 2.0-3.0  Dietary changes: no    Health status changes: no    Bleeding/hemorrhagic complications: no    Recent/future hospitalizations: no    Any changes in medication regimen? no    Recent/future dental: no  Any missed doses?: no       Is patient compliant with meds? yes       Allergies: No Known Drug Allergies  Anticoagulation Management History:      The patient comes in today for his initial visit for anticoagulation therapy.  Positive risk factors for bleeding include an age of 74 years or older and presence of serious comorbidities.  The bleeding index is 'intermediate risk'.  Positive CHADS2 values include History of HTN and History of Diabetes.  Negative CHADS2 values include Age > 74 years old.  His last INR was 1.0 ratio.  Anticoagulation responsible provider: Graciela Husbands MD, Viviann Spare.  INR POC: 1.4.  Cuvette Lot#: [id number].  Exp: 11/2011.    Anticoagulation Management Assessment/Plan:      The next INR is due 01/28/2011.  Results were reviewed/authorized by Windell Hummingbird, RN.  He was notified by Windell Hummingbird, RN.         Current Anticoagulation Instructions: INR 1.4 Take 1 1/2 tablets today and tomorrow.  Then, take 1 tablet every day.   Recheck in 1 week.

## 2011-01-28 ENCOUNTER — Encounter: Payer: Self-pay | Admitting: Cardiology

## 2011-01-28 ENCOUNTER — Encounter (INDEPENDENT_AMBULATORY_CARE_PROVIDER_SITE_OTHER): Payer: Medicare Other

## 2011-01-28 DIAGNOSIS — I4891 Unspecified atrial fibrillation: Secondary | ICD-10-CM

## 2011-01-28 DIAGNOSIS — Z7901 Long term (current) use of anticoagulants: Secondary | ICD-10-CM
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
## 2011-01-30 ENCOUNTER — Other Ambulatory Visit: Payer: Self-pay | Admitting: Thoracic Surgery (Cardiothoracic Vascular Surgery)

## 2011-01-30 DIAGNOSIS — I2510 Atherosclerotic heart disease of native coronary artery without angina pectoris: Secondary | ICD-10-CM

## 2011-02-02 ENCOUNTER — Ambulatory Visit
Admission: RE | Admit: 2011-02-02 | Discharge: 2011-02-02 | Disposition: A | Discharge status: Home / Self Care | Payer: Medicare Other | Source: Ambulatory Visit | Attending: Thoracic Surgery (Cardiothoracic Vascular Surgery) | Admitting: Thoracic Surgery (Cardiothoracic Vascular Surgery)

## 2011-02-02 ENCOUNTER — Encounter (INDEPENDENT_AMBULATORY_CARE_PROVIDER_SITE_OTHER): Payer: Self-pay

## 2011-02-02 DIAGNOSIS — I2510 Atherosclerotic heart disease of native coronary artery without angina pectoris: Secondary | ICD-10-CM

## 2011-02-03 NOTE — Medication Information (Signed)
Summary: rov/pc  Anticoagulant Therapy  Managed by: Bethena Midget, RN, BSN Referring MD: Dietrich Pates Supervising MD: Riley Kill MD, Maisie Fus Indication 1: Atrial Fibrillation Lab Used: [facility] [place] Site: [address] INR POC 1.7 INR RANGE 2.0-3.0  Dietary changes: no    Health status changes: no    Bleeding/hemorrhagic complications: no    Recent/future hospitalizations: no    Any changes in medication regimen? no    Recent/future dental: no  Any missed doses?: no       Is patient compliant with meds? yes       Allergies: No Known Drug Allergies  Anticoagulation Management History:      The patient is taking warfarin and comes in today for a routine follow up visit.  Positive risk factors for bleeding include an age of 83 years or older and presence of serious comorbidities.  The bleeding index is 'intermediate risk'.  Positive CHADS2 values include History of HTN and History of Diabetes.  Negative CHADS2 values include Age > 56 years old.  His last INR was 1.0 ratio.  Anticoagulation responsible provider: Riley Kill MD, Maisie Fus.  INR POC: 1.7.  Cuvette Lot#: [id number].  Exp: 01/2012.    Anticoagulation Management Assessment/Plan:      The next INR is due 02/04/2011.  Anticoagulation instructions were given to patient.  Results were reviewed/authorized by Bethena Midget, RN, BSN.  He was notified by Bethena Midget, RN, BSN.         Prior Anticoagulation Instructions: INR 1.4 Take 1 1/2 tablets today and tomorrow.  Then, take 1 tablet every day.   Recheck in 1 week.  Current Anticoagulation Instructions: INR 1.7 Change dose to 1  tablet everyday except 1.5 tablets on Mondays, Wednesdays and Fridays. Recheck in one week.

## 2011-02-03 NOTE — Assessment & Plan Note (Signed)
OFFICE VISIT  [name], [name] DOB:  1935-11-14                                        February 02, 2011 CHART #:  [id number]  HISTORY OF PRESENT ILLNESS:  The patient comes in today for a 3-week postoperative followup.  He is status post coronary artery bypass grafting x2 on January 12, 2011.  His postoperative course was complicated by atrial fibrillation for which he was treated with amiodarone, Lopressor, and Coumadin.  He also had a postoperative ileus which had resolved at the time of his discharge.  He was ultimately able to be discharged home on January 19, 2011, in good condition.  Since his discharge home, he has continued to progress well.  He is slowly improving with mobility and denies any dizziness or shortness of breath with ambulation.  He still having some soreness in his chest, but he is down to taking the pain medication only at night.  He had his blood work checked at the [facility] on Friday and his INR at that time was 1.7.  They currently have him on 7.5 mg of Coumadin on Monday, Wednesday, and Friday and 5 mg on Tuesday, Thursday, Saturday and Sunday.  He is scheduled to see Dr. Tenny Craw in 1 week.  He overall feels he is progressing well.  His only complaint today is some lower extremity edema and a slight cough and shortness of breath.  He did go home on 5 days of Lasix but has completed his course at this point.  His weight has come down some, but he still feels like his lower extremities are more swollen than usual.  PHYSICAL EXAMINATION:  Vital Signs:  Blood pressure is 153/83, pulse is 79, respirations 20, O2 sat 96% on room air.  Chest:  His sternal incision and chest tube sites have healed well.  His sternum is stable to palpation.  Heart:  Regular rate and rhythm without murmurs, rubs or gallops.  Lungs:  Clear but slightly diminished in the bases, left greater than right.  Extremities:  His right leg GVH site has  healed well.  There is a small area just proximal to the incision itself which is firm to touch but nontender and nonerythematous which is likely to be a small hematoma.  Lower extremities:  Show some mild edema bilaterally.  LABORATORY DATA:  On chest x-ray from today, he has a slight increase in the left pleural effusion.  Otherwise stable.  ASSESSMENT/PLAN:  The patient is overall progressing well status post coronary artery bypass grafting x2.  I think he will probably require another week of diuresis.  I have given him a prescription for Lasix 40 mg daily and potassium 20 mEq daily times 1 week each.  He has also requested a prescription for oxycodone IR #30 as he still requires some at night to help him sleep.  He is maintaining sinus rhythm and is being followed by [facility].  He will see Dr. Tenny Craw next week and hopefully at that time his amiodarone could be decreased.  His blood pressure is a little elevated today that he did not take his blood pressure medications this morning.  He states that at home it has not been running this time.  We will let cardiology address this issue, the visit and hopefully will not require any further titration of  his blood pressure medications.  From surgical standpoint, at this point he can begin to increase his activity and began to drive short distances during the day provided he is not taking the pain medication.  If he continues to remain edematous or short of breath, we may need to see him back in a week or so with a repeat chest x-ray just to follow up.  Otherwise we will plan on seeing him back as needed.  Coral Ceo, P.A.  GC/MEDQ  D:  02/02/2011  T:  02/03/2011  Job:  [id number]  cc:   Dr. Tenny Craw

## 2011-02-04 ENCOUNTER — Ambulatory Visit (INDEPENDENT_AMBULATORY_CARE_PROVIDER_SITE_OTHER): Payer: Medicare Other | Admitting: *Deleted

## 2011-02-04 DIAGNOSIS — I4891 Unspecified atrial fibrillation: Secondary | ICD-10-CM

## 2011-02-04 NOTE — Patient Instructions (Signed)
INR 2.1 Continue taking 1 tablet daily, except take 1 1/2 tablets on Mondays, Wednesdays, and Fridays. Recheck in 4 weeks.

## 2011-02-09 ENCOUNTER — Ambulatory Visit (INDEPENDENT_AMBULATORY_CARE_PROVIDER_SITE_OTHER): Payer: Medicare Other | Admitting: Internal Medicine

## 2011-02-09 ENCOUNTER — Encounter: Payer: Self-pay | Admitting: Internal Medicine

## 2011-02-09 DIAGNOSIS — I2510 Atherosclerotic heart disease of native coronary artery without angina pectoris: Secondary | ICD-10-CM

## 2011-02-09 DIAGNOSIS — E785 Hyperlipidemia, unspecified: Secondary | ICD-10-CM

## 2011-02-09 DIAGNOSIS — I10 Essential (primary) hypertension: Secondary | ICD-10-CM

## 2011-02-09 DIAGNOSIS — I4891 Unspecified atrial fibrillation: Secondary | ICD-10-CM

## 2011-02-09 NOTE — Patient Instructions (Signed)
Your physician has recommended you make the following change in your medication:  Decrease Amiodarone to 1 time per day Your physician recommends that you schedule a follow-up appointment in June 2012 with Dr.Ross

## 2011-02-09 NOTE — Assessment & Plan Note (Signed)
Recovering well.  Continue on same medicines.

## 2011-02-09 NOTE — Assessment & Plan Note (Signed)
I would keep on this.  Will need to review lipids.

## 2011-02-09 NOTE — Assessment & Plan Note (Signed)
BP is adequately controlled. 

## 2011-02-09 NOTE — Assessment & Plan Note (Signed)
Decrease Amio to 200 mg per day.  Continue coumadin.  Will probably d/c in next 2 months.

## 2011-02-09 NOTE — Progress Notes (Signed)
HPI Patient is a 76 year old who I saw in clinic a few wks ago for an abnormal myoview.  He went on to have L heart cath and was found to have severe L main disease. He underwent CABG (LIMA to LAD and SVG to OM.)  RCA was without significant disease.  Post op he had atrial fibrillation  He was placed on Amio and COumadin SInce D/C he has done well.  He saw C. Cornelius Moras last wk.  Denies fever.  Breathing is better.  Still some soreness in chest wall.  Does note some numbness in bilateral pinky fingers and problems holding things.  No Known Allergies  Current Outpatient Prescriptions  Medication Sig Dispense Refill  . amiodarone (PACERONE) 200 MG tablet 200 mg 2 (two) times daily.       Marland Kitchen aspirin 325 MG tablet Take 325 mg by mouth daily.        . metoprolol tartrate (LOPRESSOR) 25 MG tablet 1 tablet Twice daily.      Marland Kitchen oxyCODONE (OXY IR/ROXICODONE) 5 MG immediate release tablet 1 tablet Every 6 hours as needed.      . simvastatin (ZOCOR) 20 MG tablet 1 tablet Daily.      Marland Kitchen triamcinolone (KENALOG) 0.5 % cream Apply topically 2 (two) times daily.        Marland Kitchen warfarin (COUMADIN) 2.5 MG tablet Take by mouth as directed.        Marland Kitchen DISCONTD: aspirin 81 MG tablet Take 325 mg by mouth daily.       Marland Kitchen DISCONTD: lisinopril (PRINIVIL,ZESTRIL) 20 MG tablet Take 1 tablet (20 mg total) by mouth daily.  90 tablet  3    Past Medical History  Diagnosis Date  . CARCINOMA, SKIN, SQUAMOUS CELL 01/07/2010  . COLONIC POLYPS, HX OF 04/26/2007  . DIABETES MELLITUS, TYPE II 04/26/2007  . HYPERTENSION 04/26/2007  . NEPHROLITHIASIS, HX OF 04/26/2007  . Unspecified hearing loss 12/18/2008    Past Surgical History  Procedure Date  . Tympanic membrane repair   . Back surgery   . Total hip arthroplasty     left on 04/15/07    Family History  Problem Relation Age of Onset  . Heart failure Mother   . Heart disease Father   . Cancer Sister   . Cancer Brother   . Heart disease Brother   . Breast cancer Sister     History     Social History  . Marital Status: Married    Spouse Name: N/A    Number of Children: N/A  . Years of Education: N/A   Occupational History  . Not on file.   Social History Main Topics  . Smoking status: Former Smoker    Quit date: 01/02/1981  . Smokeless tobacco: Not on file  . Alcohol Use: No  . Drug Use: Not on file  . Sexually Active: Not on file   Other Topics Concern  . Not on file   Social History Narrative  . No narrative on file    Review of Systems:  All systems reviewed.  They are negative to the above problem except as previously stated.  Vital Signs: BP 136/72  Pulse 65  Resp 18  Ht 5\' 9"  (1.753 m)  Wt 215 lb (97.523 kg)  BMI 31.75 kg/m2  Physical Exam  HEENT:  Normocephalic, atraumatic. EOMI, PERRLA.  Neck: JVP is normal. No thyromegaly. No bruits.  Lungs: clear to auscultation. No rales no wheezes.  Heart: Regular rate and rhythm.  Normal S1, S2. No S3.   No significant murmurs. PMI not displaced.  Abdomen:  Supple, nontender. Normal bowel sounds. No masses. No hepatomegaly.  Pacing wire sites:  Covered with bandaids.  When removied covered with ointment.  Sl. Oozing at ends.  No evid of infection.  Extremities:   Good distal pulses throughout. No lower extremity edema.  Musculoskeletal :moving all extremities.  Neuro:   alert and oriented x3.  CN II-XII grossly intact.  Strength in hands normal.  Pinprick decreased in pinky fingers bilaterally.  EKG:  NSR.  65 bpm.  First degree AV block.  T wave inversion II, III, AVF   Assessment and Plan:

## 2011-02-09 NOTE — Assessment & Plan Note (Signed)
Patient is s/p CABG.  Doing well.  I think the hand numbness, mild weakness is due to a neuropraxia.  I would follow.   Keep on same regimen except for deceasing amiodarone Have instructed him to let pacing sites dry, keep open or use loose gauze Encouraged participation in cardiac rehab.

## 2011-02-10 NOTE — Discharge Summary (Signed)
  [name], [name]           ACCOUNT NO.:  [id number]  MEDICAL RECORD NO.:  [id number]           PATIENT TYPE:  I  LOCATION:  2024                         FACILITY:  [facility]  PHYSICIAN:  Salvatore Decent. Cornelius Moras, M.D. DATE OF BIRTH:  29-Apr-1935  DATE OF ADMISSION:  01/12/2011 DATE OF DISCHARGE:  01/19/2011                              DISCHARGE SUMMARY   ADDENDUM  BRIEF HOSPITAL COURSE STAY:  The patient has remained afebrile, vital signs stable, and has remained in sinus rhythm.  There has been no change in his physical exam.  His INR, however, is slightly decreased from 1.26 to 1.12 today.  As a result, we will increase his Coumadin to 5 mg p.o. every evening or as directed by Dr. Tenny Craw' office.  Dr. Cornelius Moras has seen and evaluated the patient, and he felt the patient is surgically stable for discharge today.  The only changes to his medications: 1. As previously stated, Coumadin 5 mg p.o. every evening. 2. Mucinex 600 mg p.o. two times daily for cough. 3. Enteric-coated aspirin 81 mg p.o. daily.     Doree Fudge, PA   ______________________________ Salvatore Decent Cornelius Moras, M.D.    DZ/MEDQ  D:  01/19/2011  T:  01/20/2011  Job:  [id number]  cc:   Dr. Margurite Auerbach H. Cornelius Moras, M.D.  Electronically Signed by Doree Fudge PA on 02/09/2011 11:52:59 AM Electronically Signed by Tressie Stalker M.D. on 02/10/2011 01:56:38 PM

## 2011-02-16 ENCOUNTER — Other Ambulatory Visit: Payer: Self-pay | Admitting: Internal Medicine

## 2011-02-26 ENCOUNTER — Telehealth: Payer: Self-pay | Admitting: Internal Medicine

## 2011-02-26 NOTE — Telephone Encounter (Signed)
Pt calls today with chest soreness from coughing and sneezing.  He thinks it may be the pollen. However, he is calling requesting percocet as he had CABG in February. Advised him to take plain tylenol.  He is on Coumadin.  Also for the sneezing and coughing: plain Claritin ,loratadine, benadryl, or zyrtec.  Pt said he does not hurt from the surgery ( He said Dr. Barry Dienes has released him), his chest is sore from coughing & sneezing. He will try the above recommended OTC.  I will forward this to Dr. Waldon Merl RN

## 2011-02-27 ENCOUNTER — Telehealth: Payer: Self-pay | Admitting: *Deleted

## 2011-02-27 NOTE — Telephone Encounter (Signed)
Should try plain tylenol.  Can give very limited Percocet to help if interfering with sleep. Twenty pills only.  No rifills.

## 2011-02-27 NOTE — Telephone Encounter (Signed)
Pt calling stating is having a lot of coughing and sneezing and would like something for pain associated with this--had CABG in February and chest is sore--per dr Tenny Craw pt may have vicodin--20 tabs with no refills--called in to [facility] on [address]--nt

## 2011-03-04 ENCOUNTER — Ambulatory Visit (INDEPENDENT_AMBULATORY_CARE_PROVIDER_SITE_OTHER): Payer: Medicare Other | Admitting: *Deleted

## 2011-03-04 DIAGNOSIS — I4891 Unspecified atrial fibrillation: Secondary | ICD-10-CM

## 2011-03-04 LAB — POCT INR: INR: 3.3

## 2011-03-04 NOTE — Telephone Encounter (Signed)
LMOM for call back to see if he still needs pain medication.

## 2011-03-06 NOTE — Telephone Encounter (Signed)
Dr.Ross has advised me that she saw this patient on Monday ( 4/16 ) when he came in with his wife for her visit and she gave him a short supply of Percocet.

## 2011-03-11 ENCOUNTER — Other Ambulatory Visit: Payer: Self-pay | Admitting: *Deleted

## 2011-03-11 ENCOUNTER — Telehealth: Payer: Self-pay | Admitting: Internal Medicine

## 2011-03-11 DIAGNOSIS — E782 Mixed hyperlipidemia: Secondary | ICD-10-CM

## 2011-03-11 MED ORDER — SIMVASTATIN 20 MG PO TABS: 20.0000 mg | ORAL_TABLET | Freq: Every day | ORAL | Status: DC

## 2011-03-11 NOTE — Telephone Encounter (Signed)
Pt states he had heart surgery in feb.  Pt states since then he has a cough and congestion chest soreness. Pt would like to speak with a nurse.

## 2011-03-11 NOTE — Telephone Encounter (Signed)
Called patient back. He reports that he has had a cough with yellow sputum production for at least 1 week  and feeling worse today. Advised him to reschedule his cardiac rehab orientation session for tomorrow and to call his primary care doctor on 4/26 to be seen and evaluated ASAP. Patient verbalized understanding.

## 2011-03-12 ENCOUNTER — Encounter: Payer: Self-pay | Admitting: Internal Medicine

## 2011-03-12 ENCOUNTER — Ambulatory Visit (INDEPENDENT_AMBULATORY_CARE_PROVIDER_SITE_OTHER)
Admission: RE | Admit: 2011-03-12 | Discharge: 2011-03-12 | Disposition: A | Discharge status: Home / Self Care | Payer: Medicare Other | Source: Ambulatory Visit | Attending: Internal Medicine | Admitting: Internal Medicine

## 2011-03-12 ENCOUNTER — Ambulatory Visit (INDEPENDENT_AMBULATORY_CARE_PROVIDER_SITE_OTHER): Payer: Medicare Other | Admitting: Internal Medicine

## 2011-03-12 VITALS — BP 120/78 | HR 78 | Wt 219.0 lb

## 2011-03-12 DIAGNOSIS — R079 Chest pain, unspecified: Secondary | ICD-10-CM

## 2011-03-12 DIAGNOSIS — R05 Cough: Secondary | ICD-10-CM

## 2011-03-12 DIAGNOSIS — R059 Cough, unspecified: Secondary | ICD-10-CM | POA: Insufficient documentation

## 2011-03-12 DIAGNOSIS — R0789 Other chest pain: Secondary | ICD-10-CM | POA: Insufficient documentation

## 2011-03-12 MED ORDER — DOXYCYCLINE HYCLATE 100 MG PO TABS: 100.0000 mg | ORAL_TABLET | Freq: Two times a day (BID) | ORAL | Status: AC

## 2011-03-12 MED ORDER — HYDROCODONE-ACETAMINOPHEN 5-500 MG PO TABS: 1.0000 | ORAL_TABLET | ORAL | Status: DC | PRN

## 2011-03-12 NOTE — Progress Notes (Signed)
  Subjective:    Patient ID: [name], male    DOB: 1935/08/05, 76 y.o.   MRN: [id number]  HPI Pt presents to clinic for evaluation of cough. Recently underwent two-vessel CABG variant of February. States since the time of the operation has had increase in cough especially in the morning. Cough is productive for yellow sputum without hemoptysis. Notes intermittent  chest congestion. Does have continued incisional pain at the sternum. Was taking Lortab but ran out of medication. States the Lortab also helped his cough. Denied associated constipation. Review chest x-ray dated March 16 demonstrating small effusions only. No fevers or chills. No other exacerbating or alleviating factors.  Reviewed past medical history, medications and allergies.    Review of Systems see history of present illness     Objective:   Physical Exam  Physical Exam  [nursing notereviewed. Constitutional:  appears well-developed and well-nourished. No distress.  HENT:  Head: Normocephalic and atraumatic.  Right Ear: Tympanic membrane, external ear and ear canal normal.  Left Ear: Tympanic membrane, external ear and ear canal normal.  Nose: Nose normal.  Mouth/Throat: Oropharynx is clear and moist. No oropharyngeal exudate.  Eyes: Conjunctivae are normal. No scleral icterus.  Neck: Neck supple.  Cardiovascular: Irregularly irregular..  Exam reveals no gallop and no friction rub.   No murmur heard. Pulmonary/Chest: Effort normal and breath sounds normal. No respiratory distress.  no wheezes.  no rales.  Lymphadenopathy:     no cervical adenopathy.  Neurological:  alert.  Skin: Skin is warm and dry.  not diaphoretic. Sternal scar noted. Well-healed. No erythema tenderness or drainage. There is associated tenderness along the sternal bone and ribs. No bony abnormality.         Assessment & Plan:

## 2011-03-12 NOTE — Assessment & Plan Note (Signed)
Status post coronary artery bypass surgery recently. Incision appears well-healed. Refill Lortab . Caution regarding possible sedating effect, constipation addiction and/or tolerance.

## 2011-03-12 NOTE — Assessment & Plan Note (Signed)
Concern for possible infectious etiology. Begin antibiotics with doxycycline x10 days. Notify Coumadin clinic of antibiotic use. Obtain chest x-ray rule out infiltrate.Followup if no improvement or worsening.

## 2011-03-13 ENCOUNTER — Telehealth: Payer: Self-pay

## 2011-03-13 NOTE — Telephone Encounter (Signed)
Message copied by Kyung Rudd on Fri Mar 13, 2011  2:13 PM ------      Message from: Letitia Libra, Maisie Fus      Created: Thu Mar 12, 2011  5:04 PM       cxr shows no obvious pneumonia. Actually cxr looks better than last one. Take abx as prescribed

## 2011-03-13 NOTE — Telephone Encounter (Signed)
Pt aware.

## 2011-03-16 ENCOUNTER — Ambulatory Visit (HOSPITAL_COMMUNITY): Payer: Medicare Other

## 2011-03-17 ENCOUNTER — Ambulatory Visit (INDEPENDENT_AMBULATORY_CARE_PROVIDER_SITE_OTHER): Payer: Medicare Other | Admitting: *Deleted

## 2011-03-17 DIAGNOSIS — I4891 Unspecified atrial fibrillation: Secondary | ICD-10-CM

## 2011-03-17 LAB — POCT INR: INR: 3.6

## 2011-03-18 ENCOUNTER — Ambulatory Visit (HOSPITAL_COMMUNITY): Payer: Medicare Other

## 2011-03-20 ENCOUNTER — Telehealth: Payer: Self-pay | Admitting: Internal Medicine

## 2011-03-20 ENCOUNTER — Ambulatory Visit (HOSPITAL_COMMUNITY): Payer: Medicare Other

## 2011-03-20 NOTE — Telephone Encounter (Signed)
LOV,12 faxed to [facility]  @  [phone] 03/20/11/km °

## 2011-03-23 ENCOUNTER — Encounter (HOSPITAL_COMMUNITY): Payer: Medicare Other | Attending: Internal Medicine

## 2011-03-23 ENCOUNTER — Other Ambulatory Visit: Payer: Self-pay | Admitting: Internal Medicine

## 2011-03-23 ENCOUNTER — Telehealth: Payer: Self-pay | Admitting: Internal Medicine

## 2011-03-23 ENCOUNTER — Ambulatory Visit (HOSPITAL_COMMUNITY): Payer: Medicare Other

## 2011-03-23 DIAGNOSIS — Z951 Presence of aortocoronary bypass graft: Secondary | ICD-10-CM | POA: Insufficient documentation

## 2011-03-23 DIAGNOSIS — Z5189 Encounter for other specified aftercare: Secondary | ICD-10-CM | POA: Insufficient documentation

## 2011-03-23 DIAGNOSIS — E119 Type 2 diabetes mellitus without complications: Secondary | ICD-10-CM | POA: Insufficient documentation

## 2011-03-23 DIAGNOSIS — I2582 Chronic total occlusion of coronary artery: Secondary | ICD-10-CM | POA: Insufficient documentation

## 2011-03-23 DIAGNOSIS — Z96649 Presence of unspecified artificial hip joint: Secondary | ICD-10-CM | POA: Insufficient documentation

## 2011-03-23 DIAGNOSIS — I4891 Unspecified atrial fibrillation: Secondary | ICD-10-CM | POA: Insufficient documentation

## 2011-03-23 DIAGNOSIS — Z87891 Personal history of nicotine dependence: Secondary | ICD-10-CM | POA: Insufficient documentation

## 2011-03-23 DIAGNOSIS — I2510 Atherosclerotic heart disease of native coronary artery without angina pectoris: Secondary | ICD-10-CM | POA: Insufficient documentation

## 2011-03-23 NOTE — Telephone Encounter (Signed)
Pt needs metoprolol to be called [facility] # [phone]

## 2011-03-24 ENCOUNTER — Other Ambulatory Visit: Payer: Self-pay | Admitting: *Deleted

## 2011-03-24 MED ORDER — METOPROLOL TARTRATE 25 MG PO TABS: 25.0000 mg | ORAL_TABLET | Freq: Two times a day (BID) | ORAL | Status: DC

## 2011-03-25 ENCOUNTER — Ambulatory Visit (HOSPITAL_COMMUNITY): Payer: Medicare Other

## 2011-03-25 ENCOUNTER — Encounter (HOSPITAL_COMMUNITY): Payer: Medicare Other

## 2011-03-25 ENCOUNTER — Encounter: Payer: Medicare Other | Admitting: *Deleted

## 2011-03-27 ENCOUNTER — Ambulatory Visit (HOSPITAL_COMMUNITY): Payer: Medicare Other

## 2011-03-27 ENCOUNTER — Encounter (HOSPITAL_COMMUNITY): Payer: Medicare Other

## 2011-03-27 ENCOUNTER — Ambulatory Visit (INDEPENDENT_AMBULATORY_CARE_PROVIDER_SITE_OTHER): Payer: Medicare Other | Admitting: *Deleted

## 2011-03-27 DIAGNOSIS — I4891 Unspecified atrial fibrillation: Secondary | ICD-10-CM

## 2011-03-30 ENCOUNTER — Encounter (HOSPITAL_COMMUNITY): Payer: Medicare Other

## 2011-03-30 ENCOUNTER — Ambulatory Visit (HOSPITAL_COMMUNITY): Payer: Medicare Other

## 2011-03-31 NOTE — Discharge Summary (Signed)
[name], [name]           ACCOUNT NO.:  [id number]   MEDICAL RECORD NO.:  [id number]          PATIENT TYPE:  INP   LOCATION:  1615                         FACILITY:  [facility]   PHYSICIAN:  Almedia Balls. Ranell Patrick, M.D. DATE OF BIRTH:  1935/10/10   DATE OF ADMISSION:  04/15/2007  DATE OF DISCHARGE:  04/19/2007                               DISCHARGE SUMMARY   ADMISSION DIAGNOSES:  1. Left hip end-stage osteoarthritis.  2. History of hypertension.   DISCHARGE DIAGNOSES:  1. Left hip end-stage osteoarthritis status post total hip      arthroplasty.  2. History of hypertension.   BRIEF HISTORY:  The patient is a 76 year old male with worsening left  hip pain that has been refractory to even conservative treatment.  Patient has elected to have left total hip arthroplasty by Dr. Malon Kindle.   PROCEDURE:  The patient had a left total hip arthroplasty on Apr 15, 2007, by Dr. Malon Kindle, assistant was Standley Dakins PA-C.   ESTIMATED BLOOD LOSS:  500 mL.   FLUID REPLACEMENT:  2300 mL, no complications.   Perioperative antibiotics were given.   HOSPITAL COURSE:  The patient was admitted on Apr 15, 2007, for the  above-stated procedure after which he tolerated well.  He was  transferred to the post-anesthesia care unit.  After adequate time  there, he was transferred up to [facility].  On postop day #1, the patient  complained of moderate pain to the left hip and inability to rest.  His  wound was healing well.  On postop day #2, the patient states he was  still unable to rest from the previous night.  Was able to work with  physical therapy quite well, but still had some moderate pain in that  left hip, but was neurovascularly intact.  No signs of erythema or  drainage from the left hip incision.  On postop day #3 and #4, the  patient was able to continue with physical therapy work with them very  well and was able to meet all of his goals before discharge from  the  hospital.   The patient's labs were all within acceptable normal limits before  discharge from the hospital with only a mild drop in his H&H, down to  9.8 and 28.3 on postop day #3, but patient was asymptomatic.   DISCHARGE PLAN:  The patient will be discharged home on April 19, 2007.   CONDITION ON DISCHARGE:  Stable.   DISCHARGE DIET:  Regular.   ALLERGIES:  The patient has no known drug allergies.   DISCHARGE MEDICATIONS:  1. Lisinopril 20 mg p.o. daily.  2. Robaxin 500 mg p.o. q.6h.  3. Percocet 5/325 1-2 tablets q.4-6h. p.r.n. pain.  4. Ferrous sulfate 325 mg p.o. t.i.d.   FOLLOWUP:  The patient will follow back up with Dr. Malon Kindle in 7-  10 days.      Thomas B. Durwin Nora, P.A.      Almedia Balls. Ranell Patrick, M.D.  Electronically Signed    TBD/MEDQ  D:  04/19/2007  T:  04/19/2007  Job:  [id number]

## 2011-03-31 NOTE — H&P (Signed)
[name], [name]           ACCOUNT NO.:  [id number]   MEDICAL RECORD NO.:  [id number]          PATIENT TYPE:  INP   LOCATION:  NA                           FACILITY:  [facility]   PHYSICIAN:  Almedia Balls. Ranell Patrick, M.D. DATE OF BIRTH:  04/05/1935   DATE OF ADMISSION:  DATE OF DISCHARGE:                              HISTORY & PHYSICAL   CHIEF COMPLAINTS:  Left hip pain.   HISTORY OF PRESENT ILLNESS:  [age]-year-old male with worsening left  hip pain that is refractory to conservative treatment.  The patient has  elected to have a left total hip arthroplasty by Dr. Malon Kindle.   PAST MEDICAL HISTORY:  For hypertension and orthopnea only.   FAMILY MEDICAL HISTORY:  Cancer.   SOCIAL HISTORY:  He is a patient Dr. Cato Mulligan.  He is married and retired,  does not smoke or use alcohol.   DRUG ALLERGIES:  1. Lisinopril 20 mg p.o. daily.  2. Aspirin 81 mg p.o. daily.   REVIEW OF SYSTEMS:  Pain with ambulation and some decreased sensation  over the lateral aspect of bilateral calves that has been going on for  over 20 years.   PHYSICAL EXAM:  VITAL SIGNS:  Pulse 80, respirations 18, blood pressure  138/73.  GENERAL APPEARANCE:  The patient is a 72-year healthy-appearing male in  no acute distress, pleasant mood and affect, alert and oriented x3.  NECK:  Full range of motion without any difficulty.  No tenderness to  palpation.  HEENT:  Cranial nerves II-XII are grossly intact.  CHEST:  Active breath sounds bilaterally.  No wheezes, rhonchi or rales.  HEART:  Shows regular rate and rhythm, no murmur.  ABDOMEN:  Nontender and nondistended with active bowel sounds.  EXTREMITIES:  Shows moderate tenderness to the left hip with internal  and external rotation.  He also has a very antalgic gait.  NEUROVASCULAR:  He is intact distally and capillary refill is less than  2 seconds.   Radiographs show left hip osteoarthritis, which is end-stage.   IMPRESSION:  End-stage osteoarthritis,  left hip.   PLAN OF ACTION:  Left total hip arthroplasty by Dr. Malon Kindle.      Thomas B. Durwin Nora, P.A.      Almedia Balls. Ranell Patrick, M.D.  Electronically Signed    TBD/MEDQ  D:  04/12/2007  T:  04/13/2007  Job:  [id number]

## 2011-03-31 NOTE — Op Note (Signed)
[name], [name]           ACCOUNT NO.:  [id number]   MEDICAL RECORD NO.:  [id number]          PATIENT TYPE:  INP   LOCATION:  1615                         [facility]ACILITY:  [facility]   PHYSICIAN:  Almedia Balls. Ranell Patrick, M.D. DATE OF BIRTH:  Dec 15, 1934   DATE OF PROCEDURE:  04/15/2007  DATE OF DISCHARGE:                               OPERATIVE REPORT   PREOPERATIVE DIAGNOSIS:  Left hip end-stage osteoarthritis.   POSTOPERATIVE DIAGNOSIS:  Left hip end-stage osteoarthritis.   PROCEDURE PERFORMED:  Left total hip replacement using DePuy SROM  prosthesis with metal-on-metal articulation.   ATTENDING SURGEON:  Malon Kindle, M.D.   ASSISTANT:  Modesto Charon, P.A.C.   General anesthesia was used.   ESTIMATED BLOOD LOSS:  500 mL.   FLUID REPLACEMENT:  2300 mL Crystalloid.   URINE OUTPUT:  300 mL.   INSTRUMENT COUNT:  Correct.   There were no complications.   Preoperative antibiotics given.   INDICATIONS:  The patient is a 76 year old male with a history of  worsening left hip pain and groin pain.  The patient has been evaluated  thoroughly by orthopedics and noted to have end-stage osteoarthritis of  his left hip.  Due to progressive pain and limitations in his function,  the patient now presents for total hip arthroplasty to eliminate pain  and restore function.  Informed consent is obtained.   DESCRIPTION OF PROCEDURE:  After an adequate level of anesthesia  achieved, the patient positioned in the right lateral decubitus position  with the left hip up.  After sterile prep and drape of the left hip, we  entered the hip through a posterior Kocher Langenbach incision.  Dissection was carried sharply down through the subcutaneous tissues.  The tensor fascia lata was identified and split as well as the gluteus  maximus.  We then identified gluteus medius, retracted that with the  Hibbs retractor, divided the piriformis tendon as well as the remainder  of the short external  rotators.  With those divided we could easily  expose the hip and dislocate it.  Once we did that, we made our neck cut  using a trial prosthesis, referencing off the greater trochanteric  height as well as the center of the femoral head.  We then went ahead  and made our neck cut with the oscillating saw.  We then retracted the  femur anteriorly, placed retractors around the acetabulum, removed all  soft tissue from the acetabulum, also removed some obvious osteophytes  posteriorly and superiorly and anteriorly.  We next found the true floor  of the acetabulum, curetting out the fovea and then went ahead and  reamed, first medializing to stay down on the teardrop and then  sequentially reaming up to a size 53 diameter reamer which can  accommodate a 54 cup.  Felt like due to where we were with bone  posteriorly in the posterior column and the posterior wall that we  should not go any further with our reaming and a 53 was as far as we  could get.  We then impacted the 54 cup, as a pinnacle cup, with a 36  inner diameter and a sector cup with 3 holes into the appropriate  position.  We were happy with the horizontal coverage in anteversion,  checking that with the Charnley guide.  Once we had done that, we went  ahead and reapproached the femur, initially reaming and removing all  lateral sided bone under the greater trochanter.  We reamed up to a size  15.5 to accept a 20 x 15 stem and we then milled the metaphysis, which  was up to a size F, and then went up to a size large for the sleeve  size.  Once we had the milling in the proximal femur complete we  impacted the trial sleeve, which was a 20S large sleeve, and that fit  the metaphysis nicely with about 20 degrees of retroversion present.  Next, we went ahead and placed our trial prosthesis with a 36+0 ball,  reduced the hip, we were happy with range of motion.  There was some  mild instability noted with forward flexion to 90 degrees  and a little  bit of adduction and internal rotation.  That is when we had removed all  excess bone anteriorly to decompress the anterior area.  We did not feel  it necessary to remove the capsule and it did not appear to be  significantly thickened or pathologic.  Once we trialed, we trialed in  with 20 degrees more of anteversion on the femoral side and noted there  would be excellent stability.  We then went ahead and placed our real  metal-on-metal liner, which was 54 outer diameter, 36 inner diameter  after placing a manhole cover, and we placed 2 screws into the ileum as  well.  We then went ahead and placed our real 20 x 28F large sleeve and  impacted that into place.  Once we did that we went ahead and trialed  again with our 20 x 15 36+8 stem with 20 degrees of anteversion and our  36+0 head ball.  We felt like there was still a little bit of leverage  going on, thus we elected to proceed with a 36+3, give [place] a little bit  more offset, a little more length, and that gave [place] excellent stability.  Thus, we removed the trial implants, inserted the 20 x 15 36+8 stem with  20 degrees of anteversion and trialed again with a +3 head, which was  perfect.  We were happy with our soft tissue balance and our length and  our stability.  Next, we went ahead and impacted the 36+0 head ball on  the trunnion and reduced the hip.  We inspected the socket one more time  to make sure there were no loose debris in the joint and then we  repaired the piriformis back to gluteus medius and also had repaired the  capsule to the gluteus minus with some Ethibond suture.  At this stage,  after thoroughly irrigating we noted there to be minimal bleeding and  this closed without a drain.  We closed the tensor fascia lata with  interrupted #1 Vicryl suture, figure-of-eight followed by #2-0 Vicryl  subcutaneous and #4-0 running Monocryl for skin.  Steri-Strips were applied followed by a sterile dressing.   Patient tolerated surgery well.      Almedia Balls. Ranell Patrick, M.D.  Electronically Signed     SRN/MEDQ  D:  04/15/2007  T:  04/15/2007  Job:  [id number]

## 2011-04-01 ENCOUNTER — Encounter (HOSPITAL_COMMUNITY): Payer: Medicare Other

## 2011-04-01 ENCOUNTER — Ambulatory Visit (HOSPITAL_COMMUNITY): Payer: Medicare Other

## 2011-04-03 ENCOUNTER — Ambulatory Visit (HOSPITAL_COMMUNITY): Payer: Medicare Other

## 2011-04-03 ENCOUNTER — Encounter (HOSPITAL_COMMUNITY): Payer: Medicare Other

## 2011-04-03 NOTE — Op Note (Signed)
NAME:  QUILL, GRINDER                     ACCOUNT NO.:  [id number]   MEDICAL RECORD NO.:  [id number]                   PATIENT TYPE:  INP   LOCATION:  3172                                 FACILITY:  [facility]   PHYSICIAN:  Danae Orleans. Venetia Maxon, M.D.               DATE OF BIRTH:  Jun 23, 1935   DATE OF PROCEDURE:  07/20/2002  DATE OF DISCHARGE:                                 OPERATIVE REPORT   PREOPERATIVE DIAGNOSIS:  Herniated lumbar disk, spondylosis, degenerative  disk disease, and radiculopathy, L5-S1 left.   POSTOPERATIVE DIAGNOSIS:  Herniated lumbar disk, spondylosis, degenerative  disk disease, and radiculopathy, L5-S1 left.   PROCEDURE:  Hemisemilaminotomy, L5-S1 left, with microdiskectomy and  microdissection.   SURGEON:  Danae Orleans. Venetia Maxon, M.D.   ASSISTANT:  Stefani Dama, M.D.   ANESTHESIA:  General endotracheal anesthesia.   ESTIMATED BLOOD LOSS:  Minimal.   COMPLICATIONS:  None.   DISPOSITION:  To recovery.   INDICATIONS:  The patient is a 76 year old man with a herniated lumbar disk  at L5-S1 on the left.  He had a left S1 radiculopathy, which was refractory  to conservative treatment.  It was elected to take him to surgery for  microdiskectomy.   DESCRIPTION OF PROCEDURE:  The patient was brought to the operating room.  Following the satisfactory and uncomplicated induction of general  endotracheal anesthesia and placement of intravenous lines, the patient was  placed in a prone position on the Wilson frame.  His low back was then  shaved, prepped, and draped in the usual sterile fashion.  The area of  planned incision was infiltrated with 0.25% Marcaine and 0.5% lidocaine and  1:200,000 epinephrine.  Incision was made in the midline overlying the L5-S1  interspace, carried through subcutaneous fat to the lumbodorsal fascia,  which was incised on the left side of midline.  Subperiosteal dissection was  performed, exposing the L5-S1 interspace.  Intraoperative  x-ray confirmed  the correct level.  Using the Anspach drill and the AM8 equivalent bur, a  hemisemilaminotomy of L5 was performed, and this was completed with Kerrison  rongeurs and a foraminotomy was performed overlying the S1 nerve root.  The  ligamentum flavum was removed in piecemeal fashion, and the common dural  tube and S1 nerve root were identified and decompressed.  The microscope was  then brought into the field and using microscopic visualization, the lateral  recess was decompressed and the S1 nerve root and common dural tube were  retracted medially, exposing a knuckle of herniated disk material which was  thinly contained by overlying ligamentous tissue, which was directly beneath  the S1 nerve root.  The fragments of disk material were removed.  They  extended into the interspace, and it was consequently elected to further  open the annulus and remove the additional herniated disk material.  The end  plates were stripped of residual disk material, as were the lateral recess  and more medial extent of the interspace.  The interspace was irrigated with  bacitracin and saline.  There was no evidence of residual loose disk  material.  The hemostasis was then assured with Gelfoam soaked in thrombin.  A coronary dilator was easily inserted both under the medial aspect of the  canal and out the neural foramen without any encumbrance.  The microscope  was then taken out of the field.  The wound was copiously irrigated with  bacitracin and saline.  The lumbodorsal fascia was closed with 0 Vicryl  suture, subcutaneous tissue was reapproximated with 2-0 Vicryl interrupted  inverted suture, skin edges reapproximated with interrupted 3-0 Vicryl  subcuticular stitches.  The wound was dressed with Dermabond.  The patient  was extubated in the operating room and taken to the recovery room in stable  and satisfactory condition, having tolerated his operation well.  Counts  were correct at  the end of the case.                                                Danae Orleans. Venetia Maxon, M.D.    JDS/MEDQ  D:  07/20/2002  T:  07/20/2002  Job:  [id number]

## 2011-04-06 ENCOUNTER — Ambulatory Visit (HOSPITAL_COMMUNITY): Payer: Medicare Other

## 2011-04-06 ENCOUNTER — Encounter (HOSPITAL_COMMUNITY): Payer: Medicare Other

## 2011-04-08 ENCOUNTER — Encounter (HOSPITAL_COMMUNITY): Payer: Medicare Other

## 2011-04-08 ENCOUNTER — Ambulatory Visit (HOSPITAL_COMMUNITY): Payer: Medicare Other

## 2011-04-10 ENCOUNTER — Encounter (HOSPITAL_COMMUNITY): Payer: Medicare Other

## 2011-04-10 ENCOUNTER — Ambulatory Visit (HOSPITAL_COMMUNITY): Payer: Medicare Other

## 2011-04-13 ENCOUNTER — Ambulatory Visit (HOSPITAL_COMMUNITY): Payer: Medicare Other

## 2011-04-13 ENCOUNTER — Encounter (HOSPITAL_COMMUNITY): Payer: Medicare Other

## 2011-04-15 ENCOUNTER — Ambulatory Visit (HOSPITAL_COMMUNITY): Payer: Medicare Other

## 2011-04-15 ENCOUNTER — Encounter (HOSPITAL_COMMUNITY): Payer: Medicare Other

## 2011-04-17 ENCOUNTER — Ambulatory Visit (INDEPENDENT_AMBULATORY_CARE_PROVIDER_SITE_OTHER): Payer: Medicare Other | Admitting: *Deleted

## 2011-04-17 ENCOUNTER — Encounter (HOSPITAL_COMMUNITY): Payer: Medicare Other | Attending: Internal Medicine

## 2011-04-17 ENCOUNTER — Ambulatory Visit (HOSPITAL_COMMUNITY): Payer: Medicare Other

## 2011-04-17 DIAGNOSIS — I4891 Unspecified atrial fibrillation: Secondary | ICD-10-CM

## 2011-04-17 DIAGNOSIS — Z96649 Presence of unspecified artificial hip joint: Secondary | ICD-10-CM | POA: Insufficient documentation

## 2011-04-17 DIAGNOSIS — I2510 Atherosclerotic heart disease of native coronary artery without angina pectoris: Secondary | ICD-10-CM | POA: Insufficient documentation

## 2011-04-17 DIAGNOSIS — I2582 Chronic total occlusion of coronary artery: Secondary | ICD-10-CM | POA: Insufficient documentation

## 2011-04-17 DIAGNOSIS — Z5189 Encounter for other specified aftercare: Secondary | ICD-10-CM | POA: Insufficient documentation

## 2011-04-17 DIAGNOSIS — E119 Type 2 diabetes mellitus without complications: Secondary | ICD-10-CM | POA: Insufficient documentation

## 2011-04-17 DIAGNOSIS — Z951 Presence of aortocoronary bypass graft: Secondary | ICD-10-CM | POA: Insufficient documentation

## 2011-04-17 DIAGNOSIS — Z87891 Personal history of nicotine dependence: Secondary | ICD-10-CM | POA: Insufficient documentation

## 2011-04-17 LAB — POCT INR: INR: 2.4

## 2011-04-20 ENCOUNTER — Encounter (HOSPITAL_COMMUNITY): Payer: Medicare Other

## 2011-04-20 ENCOUNTER — Ambulatory Visit (HOSPITAL_COMMUNITY): Payer: Medicare Other

## 2011-04-22 ENCOUNTER — Encounter (HOSPITAL_COMMUNITY): Payer: Medicare Other

## 2011-04-22 ENCOUNTER — Ambulatory Visit (HOSPITAL_COMMUNITY): Payer: Medicare Other

## 2011-04-24 ENCOUNTER — Ambulatory Visit (HOSPITAL_COMMUNITY): Payer: Medicare Other

## 2011-04-24 ENCOUNTER — Encounter (HOSPITAL_COMMUNITY): Payer: Medicare Other

## 2011-04-27 ENCOUNTER — Encounter (HOSPITAL_COMMUNITY): Payer: Medicare Other

## 2011-04-27 ENCOUNTER — Ambulatory Visit (HOSPITAL_COMMUNITY): Payer: Medicare Other

## 2011-04-28 ENCOUNTER — Telehealth: Payer: Self-pay | Admitting: Internal Medicine

## 2011-04-28 NOTE — Telephone Encounter (Signed)
Amiodarone 200 mg refill uses [facility] [address], prescribed by dr Barry Dienes originally, pt wants dr Tenny Craw to take over refills

## 2011-04-29 ENCOUNTER — Ambulatory Visit (HOSPITAL_COMMUNITY): Payer: Medicare Other

## 2011-04-29 ENCOUNTER — Encounter (HOSPITAL_COMMUNITY): Payer: Medicare Other

## 2011-04-30 MED ORDER — AMIODARONE HCL 200 MG PO TABS: 200.0000 mg | ORAL_TABLET | Freq: Every day | ORAL | Status: DC

## 2011-04-30 NOTE — Telephone Encounter (Signed)
Last note states Your physician has recommended you make the following change in your medication:  Decrease Amiodarone to 1 time per day will refill this medication 1 year supply

## 2011-05-01 ENCOUNTER — Ambulatory Visit (HOSPITAL_COMMUNITY): Payer: Medicare Other

## 2011-05-01 ENCOUNTER — Encounter (HOSPITAL_COMMUNITY): Payer: Medicare Other

## 2011-05-04 ENCOUNTER — Ambulatory Visit (HOSPITAL_COMMUNITY): Payer: Medicare Other

## 2011-05-04 ENCOUNTER — Telehealth: Payer: Self-pay | Admitting: Internal Medicine

## 2011-05-04 ENCOUNTER — Encounter (HOSPITAL_COMMUNITY): Payer: Medicare Other

## 2011-05-04 MED ORDER — WARFARIN SODIUM 5 MG PO TABS: ORAL_TABLET | ORAL | Status: DC

## 2011-05-04 NOTE — Telephone Encounter (Signed)
[facility] [address] [phone].

## 2011-05-06 ENCOUNTER — Encounter (HOSPITAL_COMMUNITY): Payer: Medicare Other

## 2011-05-06 ENCOUNTER — Ambulatory Visit (HOSPITAL_COMMUNITY): Payer: Medicare Other

## 2011-05-07 ENCOUNTER — Encounter: Payer: Self-pay | Admitting: Internal Medicine

## 2011-05-07 ENCOUNTER — Ambulatory Visit (INDEPENDENT_AMBULATORY_CARE_PROVIDER_SITE_OTHER): Payer: Medicare Other | Admitting: Internal Medicine

## 2011-05-07 VITALS — BP 132/80 | HR 67 | Resp 18 | Ht 68.0 in | Wt 224.0 lb

## 2011-05-07 DIAGNOSIS — I10 Essential (primary) hypertension: Secondary | ICD-10-CM

## 2011-05-07 DIAGNOSIS — I2510 Atherosclerotic heart disease of native coronary artery without angina pectoris: Secondary | ICD-10-CM

## 2011-05-07 DIAGNOSIS — E785 Hyperlipidemia, unspecified: Secondary | ICD-10-CM

## 2011-05-07 DIAGNOSIS — I4891 Unspecified atrial fibrillation: Secondary | ICD-10-CM

## 2011-05-07 NOTE — Assessment & Plan Note (Signed)
Remains in SR.  I would stop Amiodarone.  In two months  Check EKG;  If no recurrance recomm D/C coumadin  Resume full dose ECASA.

## 2011-05-07 NOTE — Patient Instructions (Signed)
STOP AMIODARONE  Fasting Lipid and Ast on day of next Coumadin Clinic visit.  Nurse visit in 8 weeks for EKG Dx atrial fib  Your physician wants you to follow-up in: November 2012 with Dr.Ross  You will receive a reminder letter in the mail two months in advance. If you don't receive a letter, please call our office to schedule the follow-up appointment.

## 2011-05-07 NOTE — Progress Notes (Signed)
HPI Patient is a 76 year old who I saw in clinic a few wks ago for an abnormal myoview. He went on to have L heart cath and was found to have severe L main disease.  He underwent CABG (LIMA to LAD and SVG to OM.) RCA was without significant disease. Post op he had atrial fibrillation He was placed on Amio and COumadin  I saw him in clinic in March. Since seen he has done well.  Chest wall pain is improving signif  Still has some numbness in his finger tips that he had since surgery (felt neuropraxia)  It is improving. No palpitatitons Wants to sto[ rehab as he has to help out with his wife who just had back surgery. No Known Allergies  Current Outpatient Prescriptions  Medication Sig Dispense Refill  . amiodarone (PACERONE) 200 MG tablet Take 1 tablet (200 mg total) by mouth daily.  30 tablet  12  . aspirin 325 MG tablet Take 325 mg by mouth daily.        . metoprolol tartrate (LOPRESSOR) 25 MG tablet Take 1 tablet (25 mg total) by mouth 2 (two) times daily.  60 tablet  10  . simvastatin (ZOCOR) 20 MG tablet Take 1 tablet (20 mg total) by mouth at bedtime.  30 tablet  6  . warfarin (COUMADIN) 5 MG tablet Take as directed by Anticoagulation clinic  40 tablet  3  . DISCONTD: HYDROcodone-acetaminophen (VICODIN) 5-500 MG per tablet Take 1 tablet by mouth every 4 (four) hours as needed for pain.  30 tablet  0  . DISCONTD: lisinopril (PRINIVIL,ZESTRIL) 20 MG tablet TAKE 1 TABLET BY MOUTH ONCE A DAY OR AS DIRECTED  90 tablet  1  . DISCONTD: triamcinolone (KENALOG) 0.5 % cream Apply topically 2 (two) times daily.          Past Medical History  Diagnosis Date  . CARCINOMA, SKIN, SQUAMOUS CELL 01/07/2010  . COLONIC POLYPS, HX OF 04/26/2007  . DIABETES MELLITUS, TYPE II 04/26/2007  . HYPERTENSION 04/26/2007  . NEPHROLITHIASIS, HX OF 04/26/2007  . Unspecified hearing loss 12/18/2008  . Coronary artery disease   . Hyperlipidemia   . Atrial fibrillation     Past Surgical History  Procedure Date  .  Tympanic membrane repair   . Back surgery   . Total hip arthroplasty     left on 04/15/07  . Coronary artery bypass graft     Family History  Problem Relation Age of Onset  . Heart failure Mother   . Heart disease Father   . Cancer Sister   . Cancer Brother   . Heart disease Brother   . Breast cancer Sister     History   Social History  . Marital Status: Married    Spouse Name: N/A    Number of Children: N/A  . Years of Education: N/A   Occupational History  . Not on file.   Social History Main Topics  . Smoking status: Former Smoker    Quit date: 01/02/1981  . Smokeless tobacco: Not on file  . Alcohol Use: No  . Drug Use: Not on file  . Sexually Active: Not on file   Other Topics Concern  . Not on file   Social History Narrative  . No narrative on file    Review of Systems:  All systems reviewed.  They are negative to the above problem except as previously stated.  Vital Signs: BP 132/80  Pulse 67  Resp 18  Ht 5\' 8"  (1.727 m)  Wt 224 lb (101.606 kg)  BMI 34.06 kg/m2  Physical Exam Patient is in NAD.  HEENT:  Normocephalic, atraumatic. EOMI, PERRLA.  Neck: JVP is normal. No thyromegaly. No bruits.  Lungs: clear to auscultation. No rales no wheezes.  Heart: Regular rate and rhythm. Normal S1, S2. No S3.   No significant murmurs. PMI not displaced.  Abdomen:  Supple, nontender. Normal bowel sounds. No masses. No hepatomegaly.  Extremities:   Good distal pulses throughout. No lower extremity edema.  Musculoskeletal :moving all extremities.  Neuro:   alert and oriented x3.  CN II-XII grossly int EKG:  Nsr.  62 bpm.  First degree Av block Assessment and Plan:

## 2011-05-07 NOTE — Assessment & Plan Note (Signed)
Will need to be followed. 

## 2011-05-07 NOTE — Assessment & Plan Note (Signed)
Good control

## 2011-05-07 NOTE — Assessment & Plan Note (Signed)
Doing very well post cabg.  Continue.meds.

## 2011-05-08 ENCOUNTER — Encounter (HOSPITAL_COMMUNITY): Payer: Medicare Other

## 2011-05-08 ENCOUNTER — Ambulatory Visit (HOSPITAL_COMMUNITY): Payer: Medicare Other

## 2011-05-11 ENCOUNTER — Ambulatory Visit (HOSPITAL_COMMUNITY): Payer: Medicare Other

## 2011-05-11 ENCOUNTER — Encounter (HOSPITAL_COMMUNITY): Payer: Medicare Other

## 2011-05-13 ENCOUNTER — Encounter (HOSPITAL_COMMUNITY): Payer: Medicare Other

## 2011-05-13 ENCOUNTER — Ambulatory Visit (HOSPITAL_COMMUNITY): Payer: Medicare Other

## 2011-05-15 ENCOUNTER — Encounter (HOSPITAL_COMMUNITY): Payer: Medicare Other

## 2011-05-15 ENCOUNTER — Encounter: Payer: Medicare Other | Admitting: *Deleted

## 2011-05-15 ENCOUNTER — Ambulatory Visit (HOSPITAL_COMMUNITY): Payer: Medicare Other

## 2011-05-15 ENCOUNTER — Other Ambulatory Visit (INDEPENDENT_AMBULATORY_CARE_PROVIDER_SITE_OTHER): Payer: Medicare Other | Admitting: *Deleted

## 2011-05-15 ENCOUNTER — Ambulatory Visit (INDEPENDENT_AMBULATORY_CARE_PROVIDER_SITE_OTHER): Payer: Medicare Other | Admitting: *Deleted

## 2011-05-15 DIAGNOSIS — I4891 Unspecified atrial fibrillation: Secondary | ICD-10-CM

## 2011-05-15 DIAGNOSIS — I10 Essential (primary) hypertension: Secondary | ICD-10-CM

## 2011-05-15 LAB — LIPID PANEL
HDL: 49.1 mg/dL (ref >39.00)
Total CHOL/HDL Ratio: 3
Triglycerides: 72 mg/dL (ref 0.0–149.0)
VLDL: 14.4 mg/dL (ref 0.0–40.0)

## 2011-05-15 LAB — AST: AST: 27 U/L (ref 0–37)

## 2011-05-18 ENCOUNTER — Encounter (HOSPITAL_COMMUNITY): Payer: Medicare Other

## 2011-05-18 ENCOUNTER — Ambulatory Visit (HOSPITAL_COMMUNITY): Payer: Medicare Other

## 2011-05-20 ENCOUNTER — Encounter (HOSPITAL_COMMUNITY): Payer: Medicare Other

## 2011-05-20 ENCOUNTER — Ambulatory Visit (HOSPITAL_COMMUNITY): Payer: Medicare Other

## 2011-05-22 ENCOUNTER — Encounter (HOSPITAL_COMMUNITY): Payer: Medicare Other

## 2011-05-22 ENCOUNTER — Ambulatory Visit (HOSPITAL_COMMUNITY): Payer: Medicare Other

## 2011-05-25 ENCOUNTER — Ambulatory Visit (HOSPITAL_COMMUNITY): Payer: Medicare Other

## 2011-05-25 ENCOUNTER — Encounter (HOSPITAL_COMMUNITY): Payer: Medicare Other

## 2011-05-27 ENCOUNTER — Encounter (HOSPITAL_COMMUNITY): Payer: Medicare Other

## 2011-05-27 ENCOUNTER — Ambulatory Visit (HOSPITAL_COMMUNITY): Payer: Medicare Other

## 2011-05-29 ENCOUNTER — Ambulatory Visit (INDEPENDENT_AMBULATORY_CARE_PROVIDER_SITE_OTHER): Payer: Medicare Other | Admitting: *Deleted

## 2011-05-29 ENCOUNTER — Encounter (HOSPITAL_COMMUNITY): Payer: Medicare Other

## 2011-05-29 ENCOUNTER — Ambulatory Visit (HOSPITAL_COMMUNITY): Payer: Medicare Other

## 2011-05-29 DIAGNOSIS — I4891 Unspecified atrial fibrillation: Secondary | ICD-10-CM

## 2011-06-01 ENCOUNTER — Ambulatory Visit (HOSPITAL_COMMUNITY): Payer: Medicare Other

## 2011-06-01 ENCOUNTER — Encounter (HOSPITAL_COMMUNITY): Payer: Medicare Other

## 2011-06-03 ENCOUNTER — Encounter (HOSPITAL_COMMUNITY): Payer: Medicare Other

## 2011-06-03 ENCOUNTER — Ambulatory Visit (HOSPITAL_COMMUNITY): Payer: Medicare Other

## 2011-06-05 ENCOUNTER — Encounter (HOSPITAL_COMMUNITY): Payer: Medicare Other

## 2011-06-05 ENCOUNTER — Ambulatory Visit (HOSPITAL_COMMUNITY): Payer: Medicare Other

## 2011-06-08 ENCOUNTER — Encounter (HOSPITAL_COMMUNITY): Payer: Medicare Other

## 2011-06-08 ENCOUNTER — Ambulatory Visit (HOSPITAL_COMMUNITY): Payer: Medicare Other

## 2011-06-10 ENCOUNTER — Ambulatory Visit (HOSPITAL_COMMUNITY): Payer: Medicare Other

## 2011-06-10 ENCOUNTER — Encounter (HOSPITAL_COMMUNITY): Payer: Medicare Other

## 2011-06-12 ENCOUNTER — Ambulatory Visit (HOSPITAL_COMMUNITY): Payer: Medicare Other

## 2011-06-12 ENCOUNTER — Encounter (HOSPITAL_COMMUNITY): Payer: Medicare Other

## 2011-06-15 ENCOUNTER — Encounter (HOSPITAL_COMMUNITY): Payer: Medicare Other

## 2011-06-15 ENCOUNTER — Ambulatory Visit (HOSPITAL_COMMUNITY): Payer: Medicare Other

## 2011-06-17 ENCOUNTER — Encounter (HOSPITAL_COMMUNITY): Payer: Medicare Other

## 2011-06-17 ENCOUNTER — Ambulatory Visit (HOSPITAL_COMMUNITY): Payer: Medicare Other

## 2011-06-18 ENCOUNTER — Ambulatory Visit (INDEPENDENT_AMBULATORY_CARE_PROVIDER_SITE_OTHER): Payer: Medicare Other | Admitting: *Deleted

## 2011-06-18 DIAGNOSIS — I4891 Unspecified atrial fibrillation: Secondary | ICD-10-CM

## 2011-06-19 ENCOUNTER — Encounter: Payer: Medicare Other | Admitting: *Deleted

## 2011-06-19 ENCOUNTER — Ambulatory Visit (HOSPITAL_COMMUNITY): Payer: Medicare Other

## 2011-06-19 ENCOUNTER — Encounter (HOSPITAL_COMMUNITY): Payer: Medicare Other

## 2011-06-22 ENCOUNTER — Ambulatory Visit (HOSPITAL_COMMUNITY): Payer: Medicare Other

## 2011-06-22 ENCOUNTER — Encounter (HOSPITAL_COMMUNITY): Payer: Medicare Other

## 2011-06-24 ENCOUNTER — Encounter (HOSPITAL_COMMUNITY): Payer: Medicare Other

## 2011-06-24 ENCOUNTER — Ambulatory Visit (HOSPITAL_COMMUNITY): Payer: Medicare Other

## 2011-06-26 ENCOUNTER — Ambulatory Visit (HOSPITAL_COMMUNITY): Payer: Medicare Other

## 2011-06-26 ENCOUNTER — Encounter (HOSPITAL_COMMUNITY): Payer: Medicare Other

## 2011-06-30 ENCOUNTER — Ambulatory Visit (INDEPENDENT_AMBULATORY_CARE_PROVIDER_SITE_OTHER): Payer: Medicare Other

## 2011-06-30 DIAGNOSIS — I4891 Unspecified atrial fibrillation: Secondary | ICD-10-CM

## 2011-07-03 ENCOUNTER — Ambulatory Visit: Payer: Medicare Other | Admitting: Internal Medicine

## 2011-07-07 ENCOUNTER — Encounter: Payer: Self-pay | Admitting: Cardiology

## 2011-07-13 ENCOUNTER — Ambulatory Visit (INDEPENDENT_AMBULATORY_CARE_PROVIDER_SITE_OTHER): Payer: Medicare Other | Admitting: Internal Medicine

## 2011-07-13 ENCOUNTER — Encounter: Payer: Self-pay | Admitting: Internal Medicine

## 2011-07-13 DIAGNOSIS — E119 Type 2 diabetes mellitus without complications: Secondary | ICD-10-CM

## 2011-07-13 DIAGNOSIS — E785 Hyperlipidemia, unspecified: Secondary | ICD-10-CM

## 2011-07-13 DIAGNOSIS — I4891 Unspecified atrial fibrillation: Secondary | ICD-10-CM

## 2011-07-13 LAB — HEMOGLOBIN A1C: Hgb A1c MFr Bld: 6.9 % — ABNORMAL HIGH (ref 4.6–6.5)

## 2011-07-13 LAB — HEPATIC FUNCTION PANEL
AST: 24 U/L (ref 0–37)
Albumin: 4.1 g/dL (ref 3.5–5.2)
Alkaline Phosphatase: 46 U/L (ref 39–117)
Total Protein: 7.2 g/dL (ref 6.0–8.3)

## 2011-07-13 LAB — BASIC METABOLIC PANEL
BUN: 20 mg/dL (ref 6–23)
CO2: 26 mEq/L (ref 19–32)
Calcium: 9.3 mg/dL (ref 8.4–10.5)
GFR: 77.54 mL/min (ref >60.00)
Glucose, Bld: 140 mg/dL — ABNORMAL HIGH (ref 70–99)
Potassium: 4.7 mEq/L (ref 3.5–5.1)
Sodium: 142 mEq/L (ref 135–145)

## 2011-07-13 LAB — LIPID PANEL: Cholesterol: 103 mg/dL (ref 0–200)

## 2011-07-13 NOTE — Patient Instructions (Signed)
Call your insurance company and see if they will cover shingles vaccine. If they will, call us and we will give it to you  

## 2011-07-13 NOTE — Progress Notes (Signed)
  Subjective:    Patient ID: [name], male    DOB: 01/10/1935, 76 y.o.   MRN: [id number]  HPI   patient comes in for followup of multiple medical problems including type 2 diabetes, hyperlipidemia, hypertension. The patient does not check blood sugar or blood pressure at home. The patetient does not follow an exercise or diet program. The patient denies any polyuria, polydipsia.  In the past the patient has gone to diabetic treatment center. The patient is tolerating medications  Without difficulty. The patient does admit to medication compliance.   No home CBGs  CAD---no sxs  Past Medical History  Diagnosis Date  . CARCINOMA, SKIN, SQUAMOUS CELL 01/07/2010  . COLONIC POLYPS, HX OF 04/26/2007  . DIABETES MELLITUS, TYPE II 04/26/2007  . HYPERTENSION 04/26/2007  . NEPHROLITHIASIS, HX OF 04/26/2007  . Unspecified hearing loss 12/18/2008  . Coronary artery disease   . Hyperlipidemia   . Atrial fibrillation    Past Surgical History  Procedure Date  . Tympanic membrane repair   . Back surgery   . Total hip arthroplasty     left on 04/15/07  . Coronary artery bypass graft     reports that he quit smoking about 30 years ago. He does not have any smokeless tobacco history on file. He reports that he does not drink alcohol. His drug history not on file. family history includes Breast cancer in his sister; Cancer in his brother and sister; Heart disease in his brother and father; and Heart failure in his mother. No Known Allergies    Review of Systems  patient denies chest pain, shortness of breath, orthopnea. Denies lower extremity edema, abdominal pain, change in appetite, change in bowel movements. Patient denies rashes, musculoskeletal complaints. No other specific complaints in a complete review of systems.      Objective:   Physical Exam   well-developed well-nourished male in no acute distress. HEENT exam atraumatic, normocephalic, neck supple without jugular venous  distention. Chest clear to auscultation cardiac exam S1-S2 are regular. Abdominal exam overweight with bowel sounds, soft and nontender. Extremities no edema. Neurologic exam is alert with a normal gait.       Assessment & Plan:

## 2011-07-13 NOTE — Assessment & Plan Note (Signed)
Monthly protimes Followed by cardiology His understanding is that Afib started after surgery. No known recurrence. May be able to dc warfarin

## 2011-07-13 NOTE — Assessment & Plan Note (Signed)
Will check labs today

## 2011-07-13 NOTE — Assessment & Plan Note (Signed)
Lab Results  Component Value Date   HGBA1C  Value: 7.1 (NOTE)                                                                       According to the ADA Clinical Practice Recommendations for 2011, when HbA1c is used as a screening test:   >=6.5%   Diagnostic of Diabetes Mellitus           (if abnormal result  is confirmed)  5.7-6.4%   Increased risk of developing Diabetes Mellitus  References:Diagnosis and Classification of Diabetes Mellitus,Diabetes Care,2011,34(Suppl 1):S62-S69 and Standards of Medical Care in         Diabetes - 2011,Diabetes Care,2011,34  (Suppl 1):S11-S61.* 01/12/2011   CREATININE 1.14 01/19/2011   CHOL 125 05/15/2011   HDL 49.10 05/15/2011   TRIG 72.0 05/15/2011    Last eye exam and foot exam:    Component Value Date/Time   HMDIABFOOTEX normal 06/16/2010    Assessment: Diabetes control: controlled Progress toward goals: at goal Barriers to meeting goals: no barriers identified  Plan: Diabetes treatment: diet and exercise Refer to: none Instruction/counseling given: reminded to get eye exam, discussed foot care, discussed the need for weight loss and discussed diet

## 2011-07-23 ENCOUNTER — Ambulatory Visit (INDEPENDENT_AMBULATORY_CARE_PROVIDER_SITE_OTHER): Payer: Medicare Other | Admitting: *Deleted

## 2011-07-23 DIAGNOSIS — I4891 Unspecified atrial fibrillation: Secondary | ICD-10-CM

## 2011-07-23 LAB — POCT INR: INR: 1.9

## 2011-07-24 ENCOUNTER — Ambulatory Visit (INDEPENDENT_AMBULATORY_CARE_PROVIDER_SITE_OTHER): Payer: Medicare Other | Admitting: Internal Medicine

## 2011-07-24 DIAGNOSIS — Z23 Encounter for immunization: Secondary | ICD-10-CM

## 2011-07-24 DIAGNOSIS — Z2911 Encounter for prophylactic immunotherapy for respiratory syncytial virus (RSV): Secondary | ICD-10-CM

## 2011-07-30 ENCOUNTER — Other Ambulatory Visit: Payer: Self-pay | Admitting: Internal Medicine

## 2011-08-20 ENCOUNTER — Telehealth: Payer: Self-pay | Admitting: Pharmacist

## 2011-08-20 ENCOUNTER — Ambulatory Visit (INDEPENDENT_AMBULATORY_CARE_PROVIDER_SITE_OTHER): Payer: Medicare Other | Admitting: *Deleted

## 2011-08-20 DIAGNOSIS — I4891 Unspecified atrial fibrillation: Secondary | ICD-10-CM

## 2011-08-20 LAB — POCT INR: INR: 2

## 2011-08-20 NOTE — Telephone Encounter (Signed)
Message copied by Velda Shell on Thu Aug 20, 2011  4:36 PM ------      Message from: Dietrich Pates V      Created: Thu Aug 20, 2011  1:38 PM       Yes.  Thank you.      ----- Message -----         From: Mariane Masters, PHARMD         Sent: 08/20/2011  10:54 AM           To: Pricilla Riffle, MD            Pt having hip injection on 10/11 by Dr. Ethelene Hal.  Needs to hold Coumadin starting 10/5.  Is this okay?  Thanks, Kennon Rounds

## 2011-08-20 NOTE — Telephone Encounter (Signed)
Pt aware okay to hold Coumadin.   

## 2011-09-03 ENCOUNTER — Ambulatory Visit (INDEPENDENT_AMBULATORY_CARE_PROVIDER_SITE_OTHER): Payer: Medicare Other | Admitting: *Deleted

## 2011-09-03 DIAGNOSIS — I4891 Unspecified atrial fibrillation: Secondary | ICD-10-CM

## 2011-09-03 LAB — PROTIME-INR
INR: 1.3
Prothrombin Time: 16.3 — ABNORMAL HIGH
Prothrombin Time: 17.6 — ABNORMAL HIGH

## 2011-09-03 LAB — CBC
HCT: 28.3 — ABNORMAL LOW
MCHC: 34.7
MCV: 90.9
Platelets: 203
Platelets: 216
RDW: 12.6
RDW: 12.7

## 2011-09-03 LAB — BASIC METABOLIC PANEL
BUN: 7
BUN: 8
CO2: 26
Calcium: 8.3 — ABNORMAL LOW
Chloride: 106
Creatinine, Ser: 0.75
GFR calc non Af Amer: >60
Glucose, Bld: 122 — ABNORMAL HIGH

## 2011-09-03 LAB — POCT INR: INR: 1.3

## 2011-09-10 ENCOUNTER — Ambulatory Visit (INDEPENDENT_AMBULATORY_CARE_PROVIDER_SITE_OTHER): Payer: Medicare Other | Admitting: *Deleted

## 2011-09-10 DIAGNOSIS — I4891 Unspecified atrial fibrillation: Secondary | ICD-10-CM

## 2011-10-07 ENCOUNTER — Ambulatory Visit (INDEPENDENT_AMBULATORY_CARE_PROVIDER_SITE_OTHER): Payer: Medicare Other | Admitting: *Deleted

## 2011-10-07 DIAGNOSIS — I4891 Unspecified atrial fibrillation: Secondary | ICD-10-CM

## 2011-10-28 ENCOUNTER — Telehealth: Payer: Self-pay | Admitting: Internal Medicine

## 2011-10-28 NOTE — Telephone Encounter (Signed)
Simvastatin 20mg  refill needed, uses [facility] [phone]

## 2011-10-29 MED ORDER — SIMVASTATIN 20 MG PO TABS: 20.0000 mg | ORAL_TABLET | Freq: Every day | ORAL | Status: DC

## 2011-11-02 ENCOUNTER — Telehealth: Payer: Self-pay | Admitting: Internal Medicine

## 2011-11-02 NOTE — Telephone Encounter (Signed)
Pt dropped off insurance papers last week and was calling to check the status of paper work. Please contact pt.

## 2011-11-03 NOTE — Telephone Encounter (Signed)
done

## 2011-11-05 ENCOUNTER — Ambulatory Visit (INDEPENDENT_AMBULATORY_CARE_PROVIDER_SITE_OTHER): Payer: Medicare Other | Admitting: *Deleted

## 2011-11-05 DIAGNOSIS — I4891 Unspecified atrial fibrillation: Secondary | ICD-10-CM

## 2011-11-05 LAB — POCT INR: INR: 2.1

## 2011-11-17 LAB — HM DIABETES EYE EXAM

## 2011-11-20 ENCOUNTER — Other Ambulatory Visit: Payer: Self-pay | Admitting: Internal Medicine

## 2011-12-17 ENCOUNTER — Ambulatory Visit (INDEPENDENT_AMBULATORY_CARE_PROVIDER_SITE_OTHER): Payer: Medicare Other

## 2011-12-17 DIAGNOSIS — I4891 Unspecified atrial fibrillation: Secondary | ICD-10-CM

## 2012-01-07 ENCOUNTER — Other Ambulatory Visit: Payer: Self-pay | Admitting: *Deleted

## 2012-01-07 ENCOUNTER — Ambulatory Visit (INDEPENDENT_AMBULATORY_CARE_PROVIDER_SITE_OTHER): Payer: Medicare Other | Admitting: *Deleted

## 2012-01-07 DIAGNOSIS — I4891 Unspecified atrial fibrillation: Secondary | ICD-10-CM

## 2012-01-07 DIAGNOSIS — Z7901 Long term (current) use of anticoagulants: Secondary | ICD-10-CM

## 2012-01-07 LAB — POCT INR: INR: 2

## 2012-02-04 ENCOUNTER — Ambulatory Visit (INDEPENDENT_AMBULATORY_CARE_PROVIDER_SITE_OTHER): Payer: Medicare Other | Admitting: *Deleted

## 2012-02-04 DIAGNOSIS — I4891 Unspecified atrial fibrillation: Secondary | ICD-10-CM

## 2012-02-04 DIAGNOSIS — Z7901 Long term (current) use of anticoagulants: Secondary | ICD-10-CM

## 2012-02-04 LAB — POCT INR: INR: 2.1

## 2012-02-22 ENCOUNTER — Other Ambulatory Visit: Payer: Self-pay

## 2012-02-22 MED ORDER — METOPROLOL TARTRATE 25 MG PO TABS: 25.0000 mg | ORAL_TABLET | Freq: Two times a day (BID) | ORAL | Status: DC

## 2012-02-23 ENCOUNTER — Other Ambulatory Visit: Payer: Self-pay

## 2012-02-23 MED ORDER — METOPROLOL TARTRATE 25 MG PO TABS: 25.0000 mg | ORAL_TABLET | Freq: Two times a day (BID) | ORAL | Status: DC

## 2012-02-23 NOTE — Telephone Encounter (Signed)
.   Requested Prescriptions   Signed Prescriptions Disp Refills  . metoprolol tartrate (LOPRESSOR) 25 MG tablet 30 tablet 1    Sig: Take 1 tablet (25 mg total) by mouth 2 (two) times daily.    Authorizing Provider: Pricilla Riffle    Ordering User: Lacie Scotts

## 2012-03-02 ENCOUNTER — Other Ambulatory Visit: Payer: Self-pay | Admitting: Internal Medicine

## 2012-03-02 MED ORDER — METOPROLOL TARTRATE 25 MG PO TABS: 25.0000 mg | ORAL_TABLET | Freq: Two times a day (BID) | ORAL | Status: DC

## 2012-03-03 ENCOUNTER — Ambulatory Visit (INDEPENDENT_AMBULATORY_CARE_PROVIDER_SITE_OTHER): Payer: Medicare Other | Admitting: *Deleted

## 2012-03-03 DIAGNOSIS — Z7901 Long term (current) use of anticoagulants: Secondary | ICD-10-CM

## 2012-03-03 DIAGNOSIS — I4891 Unspecified atrial fibrillation: Secondary | ICD-10-CM

## 2012-03-03 LAB — POCT INR: INR: 2.3

## 2012-03-17 ENCOUNTER — Ambulatory Visit: Payer: Medicare Other | Admitting: Internal Medicine

## 2012-03-17 ENCOUNTER — Ambulatory Visit (INDEPENDENT_AMBULATORY_CARE_PROVIDER_SITE_OTHER): Payer: Medicare Other | Admitting: Physician Assistant

## 2012-03-17 ENCOUNTER — Encounter: Payer: Self-pay | Admitting: Physician Assistant

## 2012-03-17 VITALS — BP 136/70 | HR 75 | Ht 69.0 in | Wt 226.0 lb

## 2012-03-17 DIAGNOSIS — I10 Essential (primary) hypertension: Secondary | ICD-10-CM

## 2012-03-17 DIAGNOSIS — E785 Hyperlipidemia, unspecified: Secondary | ICD-10-CM

## 2012-03-17 DIAGNOSIS — I4891 Unspecified atrial fibrillation: Secondary | ICD-10-CM

## 2012-03-17 DIAGNOSIS — R0602 Shortness of breath: Secondary | ICD-10-CM

## 2012-03-17 DIAGNOSIS — I2510 Atherosclerotic heart disease of native coronary artery without angina pectoris: Secondary | ICD-10-CM

## 2012-03-17 LAB — BRAIN NATRIURETIC PEPTIDE: Pro B Natriuretic peptide (BNP): 85 pg/mL (ref 0.0–100.0)

## 2012-03-17 NOTE — Patient Instructions (Signed)
Your physician has requested that you have an echocardiogram ON 03/22/12 @ 11:30 AM. Echocardiography is a painless test that uses sound waves to create images of your heart. It provides your doctor with information about the size and shape of your heart and how well your heart's chambers and valves are working. This procedure takes approximately one hour. There are no restrictions for this procedure.  Your physician recommends that you return for lab work in: TODAY BMET, BNP  STOP COUMADIN AS OF TODAY 03/17/12 PER SCOTT WEAVER, [facility]  Your physician wants you to follow-up in: 6 MONTHS WITH DR. Tenny Craw. You will receive a reminder letter in the mail two months in advance. If you don't receive a letter, please call our office to schedule the follow-up appointment.

## 2012-03-17 NOTE — Progress Notes (Signed)
[address] [place], [place]  [address] Phone: [phone] Fax:  [phone]  Date:  03/17/2012   Name:  [name]       DOB:  [birth year] MRN:  [id number]  PCP:  Judie Petit, MD, MD  Primary Cardiologist:  Dr. Dietrich Pates  Primary Electrophysiologist:  None    History of Present Illness: [name] is a 77 y.o. male who returns for follow up.    He has a history of CAD, status post CABG 01/2011, diabetes, hypertension, nephrolithiasis, hyperlipidemia, postoperative atrial fibrillation.  LHC 01/12/11 prior to his CABG demonstrated left main and severe double vessel CAD.  EF 45%.  He had no significant disease in the RCA.  Grafts at the time of his CABG: LIMA-distal LAD, SVG-circumflex.  He remained on Coumadin and amiodarone for some time postoperatively b/c of postoperative atrial fibrillation.  Last seen by Dr. Tenny Craw 04/2011.  Doing well.  Notes dyspnea with moderate exertion.  Probably class 2.  No orthopnea, PND or edema.  No chest pain.  No syncope.  No palps.  Still on coumadin.  Now off amiodarone.    Past Medical History  Diagnosis Date  . CARCINOMA, SKIN, SQUAMOUS CELL 01/07/2010  . COLONIC POLYPS, HX OF 04/26/2007  . DIABETES MELLITUS, TYPE II 04/26/2007  . HYPERTENSION 04/26/2007  . NEPHROLITHIASIS, HX OF 04/26/2007  . Unspecified hearing loss 12/18/2008  . Coronary artery disease     s/p CABG 3/12: L-LAD, S-CFX (Dr. Cornelius Moras); EF 45% at cath prior to CABG  . Hyperlipidemia   . Atrial fibrillation     post op; amiodarone and coumadin continued for 3 mos post op    Current Outpatient Prescriptions  Medication Sig Dispense Refill  . amiodarone (PACERONE) 200 MG tablet TAKE 1 TABLET BY MOUTH TWICE A DAY WITH MEALS  60 tablet  6  . aspirin 325 MG tablet Take 325 mg by mouth daily.        . metoprolol tartrate (LOPRESSOR) 25 MG tablet Take 1 tablet (25 mg total) by mouth 2 (two) times daily.  60 tablet  1  . simvastatin (ZOCOR) 20 MG  tablet Take 1 tablet (20 mg total) by mouth at bedtime.  30 tablet  6  . warfarin (COUMADIN) 5 MG tablet TAKE 1 TABLET BY MOUTH EVERY EVENING  60 tablet  2    Allergies: No Known Allergies  History  Substance Use Topics  . Smoking status: Former Smoker    Quit date: 01/02/1981  . Smokeless tobacco: Not on file  . Alcohol Use: No     ROS:  Please see the history of present illness.    All other systems reviewed and negative.   PHYSICAL EXAM: VS:  BP 136/70  Pulse 75  Ht 5\' 9"  (1.753 m)  Wt 226 lb (102.513 kg)  BMI 33.37 kg/m2 Well nourished, well developed, in no acute distress HEENT: normal Neck: no JVD Cardiac:  normal S1, S2; RRR; no murmur Lungs:  Dry crackles at bases bilaterally, no wheezing, rhonchi or rales Abd: soft, nontender, no hepatomegaly Ext: trace to 1+ bilateral LE edema Skin: warm and dry Neuro:  CNs 2-12 intact, no focal abnormalities noted  EKG:  NSR, HR 75, septal Q waves, 1st degree AVB (210 ms), no change from prior   ASSESSMENT AND PLAN:  1. Coronary Artery Disease, s/p CABG 01/2101 Stable. No angina.  Continue ASA 325 mg QD.  Follow up with Dr. Dietrich Pates in 6  mos.   2. Post-Operative Atrial Fibrillation  In NSR.  Now off Amiodarone for 11 mos.  D/w Dr. Tenny Craw.  Ok to stop coumadin.  Continue ASA.  3. Hypertension Controlled.  Continue current therapy.   4. Hyperlipidemia Lab Results  Component Value Date   CHOL 103 07/13/2011   HDL 49.70 07/13/2011   LDLCALC 37 07/13/2011   TRIG 81.0 07/13/2011   CHOLHDL 2 07/13/2011    Lab Results  Component Value Date   ALT 20 07/13/2011   AST 24 07/13/2011   ALKPHOS 46 07/13/2011   BILITOT 0.7 07/13/2011    Controlled.  Continue current therapy.   5. Dyspnea  Chronic.  He did have a low EF prior to CABG and LE edema on exam.  Check bmet and bnp.  Check echo to follow up on EF. If BNP up, start low dose Lasix and follow up in one month.  If EF still down, will need to try to start ACE, etc.      Signed, Tereso Newcomer, PA-C  2:14 PM 03/17/2012

## 2012-03-18 ENCOUNTER — Telehealth: Payer: Self-pay | Admitting: *Deleted

## 2012-03-18 LAB — BASIC METABOLIC PANEL
Chloride: 104 mEq/L (ref 96–112)
Creatinine, Ser: 1.5 mg/dL (ref 0.4–1.5)
GFR: 49.23 mL/min — ABNORMAL LOW (ref >60.00)

## 2012-03-18 NOTE — Telephone Encounter (Signed)
Message copied by Tarri Fuller on Fri Mar 18, 2012  4:51 PM ------      Message from: [place], [place] T      Created: Fri Mar 18, 2012  4:35 PM       Please notify patient that the lab results are ok.      Tereso Newcomer, PA-C  4:35 PM 03/18/2012

## 2012-03-18 NOTE — Telephone Encounter (Signed)
wife notified of normal labs for pt, said she will tell pt

## 2012-03-22 ENCOUNTER — Telehealth: Payer: Self-pay | Admitting: *Deleted

## 2012-03-22 ENCOUNTER — Ambulatory Visit (HOSPITAL_COMMUNITY): Payer: Medicare Other | Attending: Cardiology

## 2012-03-22 ENCOUNTER — Other Ambulatory Visit: Payer: Self-pay

## 2012-03-22 DIAGNOSIS — I10 Essential (primary) hypertension: Secondary | ICD-10-CM | POA: Insufficient documentation

## 2012-03-22 DIAGNOSIS — I4891 Unspecified atrial fibrillation: Secondary | ICD-10-CM | POA: Insufficient documentation

## 2012-03-22 DIAGNOSIS — I079 Rheumatic tricuspid valve disease, unspecified: Secondary | ICD-10-CM | POA: Insufficient documentation

## 2012-03-22 DIAGNOSIS — R0602 Shortness of breath: Secondary | ICD-10-CM

## 2012-03-22 DIAGNOSIS — E785 Hyperlipidemia, unspecified: Secondary | ICD-10-CM | POA: Insufficient documentation

## 2012-03-22 DIAGNOSIS — I2510 Atherosclerotic heart disease of native coronary artery without angina pectoris: Secondary | ICD-10-CM | POA: Insufficient documentation

## 2012-03-22 DIAGNOSIS — E119 Type 2 diabetes mellitus without complications: Secondary | ICD-10-CM | POA: Insufficient documentation

## 2012-03-22 NOTE — Telephone Encounter (Signed)
Message copied by Tarri Fuller on Tue Mar 22, 2012  4:20 PM ------      Message from: [place], [place] T      Created: Tue Mar 22, 2012  4:14 PM       EF 50%.      Continue current rx.      Tereso Newcomer, PA-C  4:14 PM 03/22/2012

## 2012-03-22 NOTE — Telephone Encounter (Signed)
s/w pt's wife and she is aware of echo results today, said she will tell pt echo good

## 2012-03-23 ENCOUNTER — Encounter (HOSPITAL_COMMUNITY): Payer: Self-pay | Admitting: Physician Assistant

## 2012-04-25 ENCOUNTER — Other Ambulatory Visit: Payer: Self-pay

## 2012-04-25 MED ORDER — METOPROLOL TARTRATE 25 MG PO TABS: 25.0000 mg | ORAL_TABLET | Freq: Two times a day (BID) | ORAL | Status: DC

## 2012-06-07 ENCOUNTER — Ambulatory Visit: Payer: Self-pay | Admitting: Pharmacist

## 2012-06-07 DIAGNOSIS — I4891 Unspecified atrial fibrillation: Secondary | ICD-10-CM

## 2012-06-27 ENCOUNTER — Ambulatory Visit (INDEPENDENT_AMBULATORY_CARE_PROVIDER_SITE_OTHER): Payer: Medicare Other | Admitting: Internal Medicine

## 2012-06-27 ENCOUNTER — Encounter: Payer: Self-pay | Admitting: Internal Medicine

## 2012-06-27 VITALS — BP 138/80 | Temp 98.5°F | Wt 234.0 lb

## 2012-06-27 DIAGNOSIS — E785 Hyperlipidemia, unspecified: Secondary | ICD-10-CM

## 2012-06-27 DIAGNOSIS — I10 Essential (primary) hypertension: Secondary | ICD-10-CM

## 2012-06-27 DIAGNOSIS — I2510 Atherosclerotic heart disease of native coronary artery without angina pectoris: Secondary | ICD-10-CM

## 2012-06-27 DIAGNOSIS — E119 Type 2 diabetes mellitus without complications: Secondary | ICD-10-CM

## 2012-06-27 LAB — LIPID PANEL
HDL: 49.9 mg/dL (ref >39.00)
LDL Cholesterol: 43 mg/dL (ref 0–99)
VLDL: 13 mg/dL (ref 0.0–40.0)

## 2012-06-27 LAB — CBC WITH DIFFERENTIAL/PLATELET
Basophils Relative: 0.7 % (ref 0.0–3.0)
Eosinophils Relative: 3.4 % (ref 0.0–5.0)
HCT: 42.2 % (ref 39.0–52.0)
Hemoglobin: 14.1 g/dL (ref 13.0–17.0)
Lymphocytes Relative: 26.6 % (ref 12.0–46.0)
Lymphs Abs: 1.5 10*3/uL (ref 0.7–4.0)
Monocytes Relative: 11.2 % (ref 3.0–12.0)
Neutro Abs: 3.2 10*3/uL (ref 1.4–7.7)
RBC: 4.5 Mil/uL (ref 4.22–5.81)
WBC: 5.6 10*3/uL (ref 4.5–10.5)

## 2012-06-27 LAB — HEPATIC FUNCTION PANEL
Albumin: 3.9 g/dL (ref 3.5–5.2)
Alkaline Phosphatase: 40 U/L (ref 39–117)
Total Protein: 6.6 g/dL (ref 6.0–8.3)

## 2012-06-27 LAB — BASIC METABOLIC PANEL
BUN: 18 mg/dL (ref 6–23)
Calcium: 8.9 mg/dL (ref 8.4–10.5)
Creatinine, Ser: 1 mg/dL (ref 0.4–1.5)
GFR: 77.34 mL/min (ref >60.00)
Glucose, Bld: 153 mg/dL — ABNORMAL HIGH (ref 70–99)
Sodium: 139 mEq/L (ref 135–145)

## 2012-06-27 LAB — TSH: TSH: 1.12 u[IU]/mL (ref 0.35–5.50)

## 2012-06-27 NOTE — Assessment & Plan Note (Signed)
On meds  recheck today

## 2012-06-27 NOTE — Progress Notes (Signed)
Patient ID: SAXTON CHAIN, male   DOB: [birth year], 77 y.o.   MRN: [id number]  patient comes in for followup of multiple medical problems including type 2 diabetes, hyperlipidemia, hypertension. The patient does not check blood sugar or blood pressure at home. The patetient does not follow an exercise or diet program. The patient denies any polyuria, polydipsia.  In the past the patient has gone to diabetic treatment center. The patient is tolerating medications  Without difficulty. The patient does admit to medication compliance (not on any DM meds).   Also has noted progressive LE edema- he has seen CV- had Echo.  Past Medical History  Diagnosis Date  . CARCINOMA, SKIN, SQUAMOUS CELL 01/07/2010  . COLONIC POLYPS, HX OF 04/26/2007  . DIABETES MELLITUS, TYPE II 04/26/2007  . HYPERTENSION 04/26/2007  . NEPHROLITHIASIS, HX OF 04/26/2007  . Unspecified hearing loss 12/18/2008  . Coronary artery disease     s/p CABG 3/12: L-LAD, S-CFX (Dr. Cornelius Moras); EF 45% at cath prior to CABG  . Hyperlipidemia   . Atrial fibrillation     post op; amiodarone and coumadin continued for 3 mos post op    History   Social History  . Marital Status: Married    Spouse Name: N/A    Number of Children: N/A  . Years of Education: N/A   Occupational History  . Not on file.   Social History Main Topics  . Smoking status: Former Smoker    Quit date: 01/02/1981  . Smokeless tobacco: Never Used  . Alcohol Use: No  . Drug Use: Not on file  . Sexually Active: Not on file   Other Topics Concern  . Not on file   Social History Narrative  . No narrative on file    Past Surgical History  Procedure Date  . Tympanic membrane repair   . Back surgery   . Total hip arthroplasty     left on 04/15/07  . Coronary artery bypass graft     Family History  Problem Relation Age of Onset  . Heart failure Mother   . Heart disease Father   . Cancer Sister   . Cancer Brother   . Heart disease Brother   . Breast cancer  Sister     No Known Allergies  Current Outpatient Prescriptions on File Prior to Visit  Medication Sig Dispense Refill  . aspirin 325 MG tablet Take 325 mg by mouth daily.        . metoprolol tartrate (LOPRESSOR) 25 MG tablet Take 1 tablet (25 mg total) by mouth 2 (two) times daily.  60 tablet  1  . simvastatin (ZOCOR) 20 MG tablet Take 1 tablet (20 mg total) by mouth at bedtime.  30 tablet  6     patient denies chest pain, shortness of breath, orthopnea. Denies lower extremity edema, abdominal pain, change in appetite, change in bowel movements. Patient denies rashes, musculoskeletal complaints. No other specific complaints in a complete review of systems.   BP 152/90  Temp 98.5 F (36.9 C) (Oral)  Wt 234 lb (106.142 kg)  well-developed well-nourished male in no acute distress. HEENT exam atraumatic, normocephalic, neck supple without jugular venous distention. Chest clear to auscultation cardiac exam S1-S2 are regular. Abdominal exam overweight with bowel sounds, soft and nontender. Extremities 1+ edema. Neurologic exam is alert with a normal gait.

## 2012-06-27 NOTE — Assessment & Plan Note (Signed)
Fair control He needs to lose weight. Initial goal weight of less than 200 pounds.

## 2012-06-27 NOTE — Assessment & Plan Note (Signed)
No sxs Has bilateral LE edema- ? Cause.  He is on [facility]-- prescribed medication 2 weeks ago. He had significant swelling prior to that

## 2012-06-27 NOTE — Assessment & Plan Note (Signed)
Needs f/u labs- check today 

## 2012-07-04 ENCOUNTER — Other Ambulatory Visit: Payer: Self-pay | Admitting: *Deleted

## 2012-07-04 MED ORDER — METOPROLOL TARTRATE 25 MG PO TABS: 25.0000 mg | ORAL_TABLET | Freq: Two times a day (BID) | ORAL | Status: DC

## 2012-07-06 ENCOUNTER — Telehealth: Payer: Self-pay | Admitting: Internal Medicine

## 2012-07-06 NOTE — Telephone Encounter (Signed)
Patient would like a call back with advise concerning the swelling in his legs. Please advise.

## 2012-07-07 NOTE — Telephone Encounter (Signed)
Pt was in on 8/12 to see Dr Cato Mulligan, labs were normal.  Pt wants to know what to do about the edema

## 2012-07-08 NOTE — Telephone Encounter (Signed)
Pt aware, order faxed to [facility].

## 2012-07-08 NOTE — Telephone Encounter (Signed)
Most likely cause is "leaky" veins Trial compression stockings- knee high, 20 mmHg

## 2012-08-09 ENCOUNTER — Other Ambulatory Visit: Payer: Self-pay | Admitting: Internal Medicine

## 2012-09-16 ENCOUNTER — Ambulatory Visit (INDEPENDENT_AMBULATORY_CARE_PROVIDER_SITE_OTHER): Payer: Medicare Other | Admitting: Internal Medicine

## 2012-09-16 ENCOUNTER — Encounter: Payer: Self-pay | Admitting: Internal Medicine

## 2012-09-16 VITALS — BP 136/76 | HR 64 | Ht 68.0 in | Wt 235.0 lb

## 2012-09-16 DIAGNOSIS — I4891 Unspecified atrial fibrillation: Secondary | ICD-10-CM

## 2012-09-16 NOTE — Progress Notes (Signed)
HPI He has a history of CAD, status post CABG 01/2011, diabetes, hypertension, nephrolithiasis, hyperlipidemia, postoperative atrial fibrillation. LHC 01/12/11 prior to his CABG demonstrated left main and severe double vessel CAD. EF 45%. He had no significant disease in the RCA. Grafts at the time of his CABG: LIMA-distal LAD, SVG-circumflex. He remained on Coumadin and amiodarone for some time postoperatively b/c of postoperative atrial fibrillation. Last seen by S weaver in May 2013 SInce seen he denies CP  No dizziness.  Does note some fatigue.  Says he does get SOB with exertion but that it is unchanged from when he was last here  No Known Allergies  Current Outpatient Prescriptions  Medication Sig Dispense Refill  . aspirin 325 MG tablet Take 325 mg by mouth daily.        . metoprolol tartrate (LOPRESSOR) 25 MG tablet Take 1 tablet (25 mg total) by mouth 2 (two) times daily.  60 tablet  3  . simvastatin (ZOCOR) 20 MG tablet Take 1 tablet (20 mg total) by mouth at bedtime.  30 tablet  6  . DISCONTD: simvastatin (ZOCOR) 20 MG tablet TAKE 1 TABLET BY MOUTH EVERY DAY  30 tablet  2    Past Medical History  Diagnosis Date  . CARCINOMA, SKIN, SQUAMOUS CELL 01/07/2010  . COLONIC POLYPS, HX OF 04/26/2007  . DIABETES MELLITUS, TYPE II 04/26/2007  . HYPERTENSION 04/26/2007  . NEPHROLITHIASIS, HX OF 04/26/2007  . Unspecified hearing loss 12/18/2008  . Coronary artery disease     s/p CABG 3/12: L-LAD, S-CFX (Dr. Cornelius Moras); EF 45% at cath prior to CABG  . Hyperlipidemia   . Atrial fibrillation     post op; amiodarone and coumadin continued for 3 mos post op    Past Surgical History  Procedure Date  . Tympanic membrane repair   . Back surgery   . Total hip arthroplasty     left on 04/15/07  . Coronary artery bypass graft     Family History  Problem Relation Age of Onset  . Heart failure Mother   . Heart disease Father   . Cancer Sister   . Cancer Brother   . Heart disease Brother   . Breast  cancer Sister     History   Social History  . Marital Status: Married    Spouse Name: N/A    Number of Children: N/A  . Years of Education: N/A   Occupational History  . Not on file.   Social History Main Topics  . Smoking status: Former Smoker    Quit date: 01/02/1981  . Smokeless tobacco: Never Used  . Alcohol Use: No  . Drug Use: Not on file  . Sexually Active: Not on file   Other Topics Concern  . Not on file   Social History Narrative  . No narrative on file    Review of Systems:  All systems reviewed.  They are negative to the above problem except as previously stated.  Vital Signs: BP 136/76  Pulse 64  Ht 5\' 8"  (1.727 m)  Wt 235 lb (106.595 kg)  BMI 35.73 kg/m2  Physical Exam Patient is in NAD HEENT:  Normocephalic, atraumatic. EOMI, PERRLA.  Neck: JVP is normal.  No bruits.  Lungs: clear to auscultation. No rales no wheezes.  Heart: Regular rate and rhythm. Normal S1, S2. No S3.   No significant murmurs. PMI not displaced.  Abdomen:  Supple, nontender. Normal bowel sounds. No masses. No hepatomegaly.  Extremities:   Good distal  pulses throughout. No lower extremity edema.  Musculoskeletal :moving all extremities.  Neuro:   alert and oriented x3.  CN II-XII grossly intact.  EKG:  SR  62  First degree AV block.  IWMI.  Anterior MI Assessment and Plan: .1.  CAD  No symptoms of angina.  Keep on same regimen.  HIs fatigue may be from b blockade.  I recomm that he take metoprolol 1x per day to see if he sees a difference in energy level.  If not, resume bid.  2.  HL  Keep on statin.  LDL in Aug was 43, HDL 50.  Stay active.

## 2012-09-16 NOTE — Patient Instructions (Signed)
Your physician wants you to follow-up in: 9 months You will receive a reminder letter in the mail two months in advance. If you don't receive a letter, please call our office to schedule the follow-up appointment.  

## 2012-10-26 ENCOUNTER — Other Ambulatory Visit: Payer: Self-pay | Admitting: *Deleted

## 2012-10-26 MED ORDER — METOPROLOL TARTRATE 25 MG PO TABS: 25.0000 mg | ORAL_TABLET | Freq: Two times a day (BID) | ORAL | Status: DC

## 2012-10-27 ENCOUNTER — Other Ambulatory Visit: Payer: Self-pay

## 2012-10-27 MED ORDER — METOPROLOL TARTRATE 25 MG PO TABS: ORAL_TABLET | ORAL | Status: DC

## 2012-11-17 ENCOUNTER — Other Ambulatory Visit: Payer: Self-pay | Admitting: Internal Medicine

## 2012-12-28 ENCOUNTER — Ambulatory Visit: Payer: Medicare Other | Admitting: Internal Medicine

## 2013-01-03 ENCOUNTER — Ambulatory Visit (INDEPENDENT_AMBULATORY_CARE_PROVIDER_SITE_OTHER): Payer: Medicare Other | Admitting: Internal Medicine

## 2013-01-03 ENCOUNTER — Encounter: Payer: Self-pay | Admitting: Internal Medicine

## 2013-01-03 VITALS — BP 144/82 | HR 84 | Temp 98.4°F | Wt 233.0 lb

## 2013-01-03 DIAGNOSIS — E119 Type 2 diabetes mellitus without complications: Secondary | ICD-10-CM

## 2013-01-03 DIAGNOSIS — R5381 Other malaise: Secondary | ICD-10-CM

## 2013-01-03 DIAGNOSIS — I10 Essential (primary) hypertension: Secondary | ICD-10-CM

## 2013-01-03 DIAGNOSIS — E785 Hyperlipidemia, unspecified: Secondary | ICD-10-CM

## 2013-01-03 DIAGNOSIS — I2510 Atherosclerotic heart disease of native coronary artery without angina pectoris: Secondary | ICD-10-CM

## 2013-01-03 DIAGNOSIS — R5383 Other fatigue: Secondary | ICD-10-CM

## 2013-01-03 LAB — CBC WITH DIFFERENTIAL/PLATELET
Basophils Absolute: 0 10*3/uL (ref 0.0–0.1)
Basophils Relative: 0.5 % (ref 0.0–3.0)
Eosinophils Absolute: 0.2 10*3/uL (ref 0.0–0.7)
Hemoglobin: 13.6 g/dL (ref 13.0–17.0)
Lymphocytes Relative: 30.1 % (ref 12.0–46.0)
MCHC: 34.6 g/dL (ref 30.0–36.0)
MCV: 89.5 fl (ref 78.0–100.0)
Monocytes Absolute: 0.7 10*3/uL (ref 0.1–1.0)
Neutro Abs: 2.9 10*3/uL (ref 1.4–7.7)
Neutrophils Relative %: 53.3 % (ref 43.0–77.0)
RDW: 12.4 % (ref 11.5–14.6)

## 2013-01-03 LAB — BASIC METABOLIC PANEL
CO2: 27 mEq/L (ref 19–32)
Calcium: 9.5 mg/dL (ref 8.4–10.5)
Creatinine, Ser: 0.9 mg/dL (ref 0.4–1.5)
GFR: 86.11 mL/min (ref >60.00)
Glucose, Bld: 267 mg/dL — ABNORMAL HIGH (ref 70–99)
Sodium: 139 mEq/L (ref 135–145)

## 2013-01-03 LAB — LIPID PANEL
Cholesterol: 77 mg/dL (ref 0–200)
LDL Cholesterol: 24 mg/dL (ref 0–99)
Triglycerides: 93 mg/dL (ref 0.0–149.0)
VLDL: 18.6 mg/dL (ref 0.0–40.0)

## 2013-01-03 LAB — HEPATIC FUNCTION PANEL
Alkaline Phosphatase: 46 U/L (ref 39–117)
Bilirubin, Direct: 0.1 mg/dL (ref 0.0–0.3)
Total Bilirubin: 0.7 mg/dL (ref 0.3–1.2)

## 2013-01-03 LAB — HEMOGLOBIN A1C: Hgb A1c MFr Bld: 9.5 % — ABNORMAL HIGH (ref 4.6–6.5)

## 2013-01-03 NOTE — Progress Notes (Signed)
Patient ID: [name], male   DOB: 12-07-1934, 78 y.o.   MRN: [id number]  patient comes in for followup of multiple medical problems including type 2 diabetes, hyperlipidemia, hypertension. The patient does not check blood sugar or blood pressure at home. The patetient does not follow an exercise or diet program. The patient denies any polyuria, polydipsia.  In the past the patient has gone to diabetic treatment center. The patient is tolerating medications  Without difficulty. The patient does admit to medication compliance (has not required any DM meds)  He admits to increased fatigue- Denies CP, PND.  Past Medical History  Diagnosis Date  . CARCINOMA, SKIN, SQUAMOUS CELL 01/07/2010  . COLONIC POLYPS, HX OF 04/26/2007  . DIABETES MELLITUS, TYPE II 04/26/2007  . HYPERTENSION 04/26/2007  . NEPHROLITHIASIS, HX OF 04/26/2007  . Unspecified hearing loss 12/18/2008  . Coronary artery disease     s/p CABG 3/12: L-LAD, S-CFX (Dr. Cornelius Moras); EF 45% at cath prior to CABG  . Hyperlipidemia   . Atrial fibrillation     post op; amiodarone and coumadin continued for 3 mos post op    History   Social History  . Marital Status: Married    Spouse Name: N/A    Number of Children: N/A  . Years of Education: N/A   Occupational History  . Not on file.   Social History Main Topics  . Smoking status: Former Smoker    Quit date: 01/02/1981  . Smokeless tobacco: Never Used  . Alcohol Use: No  . Drug Use: Not on file  . Sexually Active: Not on file   Other Topics Concern  . Not on file   Social History Narrative  . No narrative on file    Past Surgical History  Procedure Laterality Date  . Tympanic membrane repair    . Back surgery    . Total hip arthroplasty      left on 04/15/07  . Coronary artery bypass graft      Family History  Problem Relation Age of Onset  . Heart failure Mother   . Heart disease Father   . Cancer Sister   . Cancer Brother   . Heart disease Brother   .  Breast cancer Sister     No Known Allergies  Current Outpatient Prescriptions on File Prior to Visit  Medication Sig Dispense Refill  . aspirin 325 MG tablet Take 325 mg by mouth daily.        . simvastatin (ZOCOR) 20 MG tablet Take 1 tablet (20 mg total) by mouth at bedtime.  30 tablet  6   No current facility-administered medications on file prior to visit.     patient denies chest pain, shortness of breath, orthopnea. Denies lower extremity edema, abdominal pain, change in appetite, change in bowel movements. Patient denies rashes, musculoskeletal complaints. No other specific complaints in a complete review of systems.   BP 144/82  Pulse 84  Temp(Src) 98.4 F (36.9 C) (Oral)  Wt 233 lb (105.688 kg)  BMI 35.44 kg/m2  Well-developed well-nourished male in no acute distress. HEENT exam atraumatic, normocephalic, extraocular muscles are intact. Neck is supple. No jugular venous distention no thyromegaly. Chest clear to auscultation without increased work of breathing. Cardiac exam S1 and S2 are regular. Abdominal exam active bowel sounds, soft, nontender. Extremities no edema. Neurologic exam she is alert without any motor sensory deficits. Gait is normal.

## 2013-01-04 NOTE — Assessment & Plan Note (Signed)
Check lipid panel and continue meds

## 2013-01-04 NOTE — Assessment & Plan Note (Signed)
BP Readings from Last 3 Encounters:  01/03/13 144/82  09/16/12 136/76  06/27/12 138/80   Well continue to follow

## 2013-01-04 NOTE — Assessment & Plan Note (Signed)
i'm concerned about worsening dm as cause of his sxs Check labs today

## 2013-01-05 ENCOUNTER — Other Ambulatory Visit: Payer: Self-pay | Admitting: *Deleted

## 2013-01-05 MED ORDER — METFORMIN HCL 500 MG PO TABS: 500.0000 mg | ORAL_TABLET | Freq: Two times a day (BID) | ORAL | Status: DC

## 2013-01-30 ENCOUNTER — Encounter: Payer: Medicare Other | Attending: Internal Medicine | Admitting: *Deleted

## 2013-01-30 ENCOUNTER — Encounter: Payer: Self-pay | Admitting: *Deleted

## 2013-01-30 VITALS — Ht 69.0 in | Wt 229.5 lb

## 2013-01-30 DIAGNOSIS — E119 Type 2 diabetes mellitus without complications: Secondary | ICD-10-CM

## 2013-01-30 DIAGNOSIS — Z713 Dietary counseling and surveillance: Secondary | ICD-10-CM | POA: Insufficient documentation

## 2013-01-30 NOTE — Patient Instructions (Signed)
Plan:  Aim for 3 Carb Choices per meal (45 grams) +/- 1 either way  Aim for 0-2 Carbs per snack if hungry  Consider reading food labels for Total Carbohydrate of foods Consider  increasing your activity level by walking or consider swimming for 15-30 minutes daily as tolerated Consider asking MD for Rx for meter and strips that your insurance company covers so they will cover cost better Consider checking BG at alternate times per day as directed by MD and keep log record at least 1 week before MD appointments Consider taking medication Metformin as directed by MD

## 2013-01-30 NOTE — Progress Notes (Signed)
  Medical Nutrition Therapy:  Appt start time: 0930 end time:  1030.  Assessment:  Primary concerns today: patient here for diabetes education. Lives with disabled wife and daughter, he shops for food and most of meal preparation. Retired from [employer]. In better weather, enjoys yard work. Cares for his wife who is disabled from strokes, heart disease and fibromyalgia and seizures. He states he attended diabetes class at [facility] when he was first diagnosed in 2001 and he verbalizes understanding of much of what was taught at that time. He states he has gotten lazy with his activity level and eating more fried foods which he has since stopped. Weight loss of 4 pounds since 01/13/2013 nonted  MEDICATIONS: see list   DIETARY INTAKE:  Usual eating pattern includes 3 meals and 2-3 snacks per day.  Everyday foods include good variety of all food groups.  Avoided foods include fried foods, regular sodas now.    24-hr recall:  B ( AM): unsweetened cereal with fresh fruit and 2% milk OR boiled eggs x 2 occasionally with toast with butter and jelly, coffee with creamer Snk ( AM): maybe a banana   L ( PM): salad OR left overs, diet Cheerwine Snk ( PM): tries to have fresh fruit D ( PM): meat, starch and vegetable type meal, diet Cheerwine Snk ( PM): tries not to, or maybe a piece of cheese, cookies during the night Beverages: diet Cheerwine, coffee  Usual physical activity: limited lately due to bad weather. He does walk the mall in the AM with a friend. Interested in checking with [employer]  Estimated energy needs: 1500 calories 170 g carbohydrates 112 g protein 42 g fat  Progress Towards Goal(s):  In progress.   Nutritional Diagnosis:  NB-1.1 Food and nutrition-related knowledge deficit As related to diabetes management.  As evidenced by increase in his A1c to 9.5% on 01/13/2013.    Intervention:  Nutrition counseling and diabetes education initiated. Discussed basic physiology of diabetes,  SMBG and rationale of checking BG at alternate times of day, A1c, Carb Counting and reading food labels, and benefits of increased activity. He verbalizes a lot of information that he learned 12 years ago when he attended his first diabetes class. I suggested he speak with his MD to get Rx for his own meter instead of using his wife's and offered a BG Log Sheet to record BG at least the week prior to any MD appointments. Also suggested he check into pool exercises at option for increasing his activity level.  Plan:  Aim for 3 Carb Choices per meal (45 grams) +/- 1 either way  Aim for 0-2 Carbs per snack if hungry  Consider reading food labels for Total Carbohydrate of foods Consider  increasing your activity level by walking or consider swimming for 15-30 minutes daily as tolerated Consider asking MD for Rx for meter and strips that your insurance company covers so they will cover cost better Consider checking BG at alternate times per day as directed by MD and keep log record at least 1 week before MD appointments Consider taking medication Metformin as directed by MD.  Handouts given during visit include: Living Well with Diabetes Carb Counting and Food Label handouts Meal Plan Card  BG Log Sheet  Monitoring/Evaluation:  Dietary intake, exercise, SMBG, and body weight prn.

## 2013-02-10 ENCOUNTER — Other Ambulatory Visit: Payer: Self-pay | Admitting: Internal Medicine

## 2013-02-11 ENCOUNTER — Other Ambulatory Visit: Payer: Self-pay | Admitting: Internal Medicine

## 2013-05-11 ENCOUNTER — Other Ambulatory Visit: Payer: Self-pay | Admitting: Internal Medicine

## 2013-07-02 ENCOUNTER — Other Ambulatory Visit: Payer: Self-pay | Admitting: Internal Medicine

## 2013-08-13 ENCOUNTER — Other Ambulatory Visit: Payer: Self-pay | Admitting: Internal Medicine

## 2013-09-09 ENCOUNTER — Encounter: Payer: Self-pay | Admitting: Cardiology

## 2013-09-25 ENCOUNTER — Ambulatory Visit (INDEPENDENT_AMBULATORY_CARE_PROVIDER_SITE_OTHER): Payer: Medicare Other | Admitting: Internal Medicine

## 2013-09-25 VITALS — BP 140/98 | HR 66 | Ht 68.0 in | Wt 230.0 lb

## 2013-09-25 DIAGNOSIS — I25810 Atherosclerosis of coronary artery bypass graft(s) without angina pectoris: Secondary | ICD-10-CM

## 2013-09-25 DIAGNOSIS — E785 Hyperlipidemia, unspecified: Secondary | ICD-10-CM

## 2013-09-25 DIAGNOSIS — I4891 Unspecified atrial fibrillation: Secondary | ICD-10-CM

## 2013-09-25 DIAGNOSIS — I10 Essential (primary) hypertension: Secondary | ICD-10-CM

## 2013-09-25 MED ORDER — AMLODIPINE BESYLATE 5 MG PO TABS: 5.0000 mg | ORAL_TABLET | Freq: Every day | ORAL | Status: DC

## 2013-09-25 NOTE — Patient Instructions (Signed)
Your physician recommends that you schedule a follow-up appointment in:  6-8 weeks with Dr. Tenny Craw  Your physician recommends that you continue on your current medications as directed. Please refer to the Current Medication list given to you today.

## 2013-09-25 NOTE — Progress Notes (Addendum)
HPI He has a history of CAD, status post CABG 01/2011, diabetes, hypertension, nephrolithiasis, hyperlipidemia, postoperative atrial fibrillation. LHC 01/12/11 prior to his CABG demonstrated left main and severe double vessel CAD. EF 45%. He had no significant disease in the RCA. Grafts at the time of his CABG: LIMA-distal LAD, SVG-circumflex. He remained on Coumadin and amiodarone for some time postoperatively b/c of postoperative atrial fibrillation. Last seen 1 year ago SInce seen he denies CP  No dizziness.  Does note some fatigue, SOB with activity  Unchanged   No Known Allergies  Current Outpatient Prescriptions  Medication Sig Dispense Refill  . aspirin 325 MG tablet Take 325 mg by mouth daily.        . metFORMIN (GLUCOPHAGE) 500 MG tablet TAKE 1 TABLET BY MOUTH TWICE A DAY WITH A MEAL  60 tablet  5  . metoprolol tartrate (LOPRESSOR) 25 MG tablet TAKE 1 TABLET BY MOUTH TWICE A DAY  60 tablet  6  . simvastatin (ZOCOR) 20 MG tablet TAKE 1 TABLET BY MOUTH EVERY DAY  30 tablet  2   No current facility-administered medications for this visit.    Past Medical History  Diagnosis Date  . CARCINOMA, SKIN, SQUAMOUS CELL 01/07/2010  . COLONIC POLYPS, HX OF 04/26/2007  . DIABETES MELLITUS, TYPE II 04/26/2007  . HYPERTENSION 04/26/2007  . NEPHROLITHIASIS, HX OF 04/26/2007  . Unspecified hearing loss 12/18/2008  . Coronary artery disease     s/p CABG 3/12: L-LAD, S-CFX (Dr. Cornelius Moras); EF 45% at cath prior to CABG  . Hyperlipidemia   . Atrial fibrillation     post op; amiodarone and coumadin continued for 3 mos post op    Past Surgical History  Procedure Laterality Date  . Tympanic membrane repair    . Back surgery    . Total hip arthroplasty      left on 04/15/07  . Coronary artery bypass graft      Family History  Problem Relation Age of Onset  . Heart failure Mother   . Heart disease Father   . Cancer Sister   . Cancer Brother   . Heart disease Brother   . Breast cancer Sister      History   Social History  . Marital Status: Married    Spouse Name: N/A    Number of Children: N/A  . Years of Education: N/A   Occupational History  . Not on file.   Social History Main Topics  . Smoking status: Former Smoker    Quit date: 01/02/1981  . Smokeless tobacco: Never Used  . Alcohol Use: No  . Drug Use: Not on file  . Sexual Activity: Not on file   Other Topics Concern  . Not on file   Social History Narrative  . No narrative on file    Review of Systems:  All systems reviewed.  They are negative to the above problem except as previously stated.  Vital Signs: BP 140/98  Pulse 66  Ht 5\' 8"  (1.727 m)  Wt 230 lb (104.327 kg)  BMI 34.98 kg/m2 BP on my check 165/90 Physical Exam Patient is an obese 78 yo  in NAD HEENT:  Normocephalic, atraumatic. EOMI, PERRLA.  Neck: JVP is normal.  No bruits.  Lungs: clear to auscultation. No rales no wheezes.  Heart: Regular rate and rhythm. Normal S1, S2. No S3.   No significant murmurs. PMI not displaced.  Abdomen:  Supple, nontender. Normal bowel sounds. No masses. No hepatomegaly.  Extremities:  Good distal pulses throughout. No lower extremity edema.  Musculoskeletal :moving all extremities.  Neuro:   alert and oriented x3.  CN II-XII grossly intact.  EKG:  SR  66  First degree AV block. Occasional PVC  Anteroseptal MI Assessment and Plan: .1.  CAD   I am not convinced of active angina.  Keep on same meds   2.  HL  Keep on statin.   3  HTN  BP is high today  Would add amlodipine 5 mg to regimen  F/U in 2 months   3.  Obesity  Discussed wt loss  He agrees he has to lose wt.  I think he would have more energy.  Breathing would be better.    Stay active.

## 2013-09-26 NOTE — Addendum Note (Signed)
Addended by: Pricilla Riffle on: 09/26/2013 04:18 PM   Modules accepted: Level of Service

## 2013-10-06 ENCOUNTER — Telehealth: Payer: Self-pay | Admitting: Internal Medicine

## 2013-10-06 MED ORDER — GLUCOSE BLOOD VI STRP: 1.0000 | ORAL_STRIP | Freq: Every day | Status: DC

## 2013-10-06 MED ORDER — EMBRACE BLOOD GLUCOSE MONITOR DEVI: 1.0000 | Freq: Every day | Status: DC

## 2013-10-06 NOTE — Telephone Encounter (Signed)
Pt has been using his wife dm meter. Pt would like a free meter to use

## 2013-10-06 NOTE — Telephone Encounter (Signed)
rx sent in electronically 

## 2013-11-04 ENCOUNTER — Other Ambulatory Visit: Payer: Self-pay | Admitting: Internal Medicine

## 2013-11-06 ENCOUNTER — Ambulatory Visit (INDEPENDENT_AMBULATORY_CARE_PROVIDER_SITE_OTHER): Payer: Medicare Other | Admitting: Internal Medicine

## 2013-11-06 ENCOUNTER — Encounter: Payer: Self-pay | Admitting: Internal Medicine

## 2013-11-06 VITALS — BP 135/76 | HR 68 | Ht 68.0 in | Wt 229.0 lb

## 2013-11-06 DIAGNOSIS — I2510 Atherosclerotic heart disease of native coronary artery without angina pectoris: Secondary | ICD-10-CM

## 2013-11-06 DIAGNOSIS — E785 Hyperlipidemia, unspecified: Secondary | ICD-10-CM

## 2013-11-06 DIAGNOSIS — I10 Essential (primary) hypertension: Secondary | ICD-10-CM

## 2013-11-06 NOTE — Patient Instructions (Signed)
Your physician wants you to follow-up in: 6 MONTHS WITH DR ROSS You will receive a reminder letter in the mail two months in advance. If you don't receive a letter, please call our office to schedule the follow-up appointment.  

## 2013-11-06 NOTE — Progress Notes (Signed)
HPI He has a history of CAD, status post CABG 01/2011, diabetes, hypertension, nephrolithiasis, hyperlipidemia, postoperative atrial fibrillation. LHC 01/12/11 prior to his CABG demonstrated left main and severe double vessel CAD. EF 45%. He had no significant disease in the RCA. Grafts at the time of his CABG: LIMA-distal LAD, SVG-circumflex. He remained on Coumadin and amiodarone for some time postoperatively b/c of postoperative atrial fibrillation.   I saw him earlier this fall  His BP was up and I added amlodipine to his regimen He has tolerated  Denies edema.  NO dizziness.  No CP No Known Allergies  Current Outpatient Prescriptions  Medication Sig Dispense Refill  . amLODipine (NORVASC) 5 MG tablet Take 1 tablet (5 mg total) by mouth daily.  30 tablet  11  . aspirin 325 MG tablet Take 325 mg by mouth daily.        . Blood Glucose Monitoring Suppl (EMBRACE BLOOD GLUCOSE MONITOR) DEVI 1 each by Does not apply route daily.  1 Device  0  . glucose blood (EMBRACE BLOOD GLUCOSE TEST) test strip 1 each by Other route daily. Use as instructed  100 each  11  . metFORMIN (GLUCOPHAGE) 500 MG tablet TAKE 1 TABLET BY MOUTH TWICE A DAY WITH A MEAL  60 tablet  5  . metoprolol tartrate (LOPRESSOR) 25 MG tablet TAKE 1 TABLET BY MOUTH TWICE A DAY  60 tablet  6  . simvastatin (ZOCOR) 20 MG tablet TAKE 1 TABLET BY MOUTH EVERY DAY  30 tablet  2   No current facility-administered medications for this visit.    Past Medical History  Diagnosis Date  . CARCINOMA, SKIN, SQUAMOUS CELL 01/07/2010  . COLONIC POLYPS, HX OF 04/26/2007  . DIABETES MELLITUS, TYPE II 04/26/2007  . HYPERTENSION 04/26/2007  . NEPHROLITHIASIS, HX OF 04/26/2007  . Unspecified hearing loss 12/18/2008  . Coronary artery disease     s/p CABG 3/12: L-LAD, S-CFX (Dr. Cornelius Moras); EF 45% at cath prior to CABG  . Hyperlipidemia   . Atrial fibrillation     post op; amiodarone and coumadin continued for 3 mos post op    Past Surgical History   Procedure Laterality Date  . Tympanic membrane repair    . Back surgery    . Total hip arthroplasty      left on 04/15/07  . Coronary artery bypass graft      Family History  Problem Relation Age of Onset  . Heart failure Mother   . Heart disease Father   . Cancer Sister   . Cancer Brother   . Heart disease Brother   . Breast cancer Sister     History   Social History  . Marital Status: Married    Spouse Name: N/A    Number of Children: N/A  . Years of Education: N/A   Occupational History  . Not on file.   Social History Main Topics  . Smoking status: Former Smoker    Quit date: 01/02/1981  . Smokeless tobacco: Never Used  . Alcohol Use: No  . Drug Use: Not on file  . Sexual Activity: Not on file   Other Topics Concern  . Not on file   Social History Narrative  . No narrative on file    Review of Systems:  All systems reviewed.  They are negative to the above problem except as previously stated.  Vital Signs: BP 135/76  Pulse 68  Ht 5\' 8"  (1.727 m)  Wt 229 lb (103.874 kg)  BMI 34.83 kg/m2 BP on my check 165/90 Physical Exam Patient is an obese 78 yo  in NAD HEENT:  Normocephalic, atraumatic. EOMI, PERRLA.  Neck: JVP is normal.  No bruits.  Lungs: clear to auscultation. No rales no wheezes.  Heart: Regular rate and rhythm. Normal S1, S2. No S3.   No significant murmurs. PMI not displaced.  Abdomen:  Supple, nontender. Normal bowel sounds. No masses. No hepatomegaly.  Extremities:   Good distal pulses throughout. No lower extremity edema.  Musculoskeletal :moving all extremities.  Neuro:   alert and oriented x3.  CN II-XII grossly intact.  EKG:  SR  66  First degree AV block. Occasional PVC  Anteroseptal MI Assessment and Plan: 1.  HTN  Better  Keep on same regimen .2.  CAD   I am not convinced of active angina.  Keep on same meds   3.  HL  Keep on statin.   4.  Obesity  Discussed wt loss.   Stay active.

## 2014-04-05 NOTE — Progress Notes (Signed)
HPI He has a history of CAD, status post CABG 01/2011, diabetes, hypertension, nephrolithiasis, hyperlipidemia, postoperative atrial fibrillation. LHC 01/12/11 prior to his CABG demonstrated left main and severe double vessel CAD. EF 45%. He had no significant disease in the RCA. Grafts at the time of his CABG: LIMA-distal LAD, SVG-circumflex. He remained on Coumadin and amiodarone for some time postoperatively b/c of postoperative atrial fibrillation.  THe patient was last in clinic in December Since seen he says his breathing is OK  No CP  NO dizzienss Increased stress with wifes health issues.  Daughter had cath this week  Sister died earlier this wk at 70.   No Known Allergies  Current Outpatient Prescriptions  Medication Sig Dispense Refill  . amLODipine (NORVASC) 5 MG tablet Take 1 tablet (5 mg total) by mouth daily.  30 tablet  11  . aspirin 325 MG tablet Take 325 mg by mouth daily.        . metFORMIN (GLUCOPHAGE) 500 MG tablet TAKE 1 TABLET BY MOUTH TWICE A DAY WITH A MEAL  60 tablet  5  . metoprolol tartrate (LOPRESSOR) 25 MG tablet TAKE 1 TABLET BY MOUTH TWICE A DAY  60 tablet  6  . simvastatin (ZOCOR) 20 MG tablet TAKE 1 TABLET BY MOUTH EVERY DAY  30 tablet  2   No current facility-administered medications for this visit.    Past Medical History  Diagnosis Date  . CARCINOMA, SKIN, SQUAMOUS CELL 01/07/2010  . COLONIC POLYPS, HX OF 04/26/2007  . DIABETES MELLITUS, TYPE II 04/26/2007  . HYPERTENSION 04/26/2007  . NEPHROLITHIASIS, HX OF 04/26/2007  . Unspecified hearing loss 12/18/2008  . Coronary artery disease     s/p CABG 3/12: L-LAD, S-CFX (Dr. Roxy Manns); EF 45% at cath prior to CABG  . Hyperlipidemia   . Atrial fibrillation     post op; amiodarone and coumadin continued for 3 mos post op    Past Surgical History  Procedure Laterality Date  . Tympanic membrane repair    . Back surgery    . Total hip arthroplasty      left on 04/15/07  . Coronary artery bypass graft      Family  History  Problem Relation Age of Onset  . Heart failure Mother   . Heart disease Father   . Cancer Sister   . Cancer Brother   . Heart disease Brother   . Breast cancer Sister     History   Social History  . Marital Status: Married    Spouse Name: N/A    Number of Children: N/A  . Years of Education: N/A   Occupational History  . Not on file.   Social History Main Topics  . Smoking status: Former Smoker    Quit date: 01/02/1981  . Smokeless tobacco: Never Used  . Alcohol Use: No  . Drug Use: Not on file  . Sexual Activity: Not on file   Other Topics Concern  . Not on file   Social History Narrative  . No narrative on file    Review of Systems:  All systems reviewed.  They are negative to the above problem except as previously stated.  Vital Signs: BP 142/77  Pulse 59  Ht 5\' 8"  (1.727 m)  Wt 232 lb (105.235 kg)  BMI 35.28 kg/m2 \ Physical Exam Patient is an obese 79 yo  in NAD HEENT:  Normocephalic, atraumatic. EOMI, PERRLA.  Neck: JVP is normal.  No bruits.  Lungs: clear to auscultation. No  rales no wheezes.  Heart: Regular rate and rhythm. Normal S1, S2. No S3.   No significant murmurs. PMI not displaced.  Abdomen:  Supple, nontender. Normal bowel sounds. No masses. No hepatomegaly.  Extremities:   Good distal pulses throughout. No lower extremity edema.  Musculoskeletal :moving all extremities.  Neuro:   alert and oriented x3.  CN II-XII grossly intact.   Assessment and Plan: 1.  HTN  Adequate control   .2.  CAD   I am not convinced of active angina.  Keep on same meds   3.  HL  Keep on statin.  Check fasting labs   4.  DM  Check BMET and HgbA1C  4.  Obesity  Discussed wt loss and exercise  LOoking for stationary bike     Stay active.

## 2014-04-06 ENCOUNTER — Encounter: Payer: Self-pay | Admitting: Internal Medicine

## 2014-04-06 ENCOUNTER — Ambulatory Visit (INDEPENDENT_AMBULATORY_CARE_PROVIDER_SITE_OTHER): Payer: Medicare Other | Admitting: Internal Medicine

## 2014-04-06 VITALS — BP 142/77 | HR 59 | Ht 68.0 in | Wt 232.0 lb

## 2014-04-06 DIAGNOSIS — I2510 Atherosclerotic heart disease of native coronary artery without angina pectoris: Secondary | ICD-10-CM

## 2014-04-06 MED ORDER — SIMVASTATIN 20 MG PO TABS: 20.0000 mg | ORAL_TABLET | Freq: Every day | ORAL | Status: DC

## 2014-04-06 NOTE — Patient Instructions (Addendum)
[facility] AT [facility]; BMET, CBC W/DIFF, LIPIDS, LFT, HGB A1 C  Your physician wants you to follow-up in: [place]. You will receive a reminder letter in the mail two months in advance. If you don't receive a letter, please call our office to schedule the follow-up appointment.

## 2014-04-11 ENCOUNTER — Other Ambulatory Visit (INDEPENDENT_AMBULATORY_CARE_PROVIDER_SITE_OTHER): Payer: Medicare Other

## 2014-04-11 DIAGNOSIS — I2510 Atherosclerotic heart disease of native coronary artery without angina pectoris: Secondary | ICD-10-CM

## 2014-04-11 LAB — HEPATIC FUNCTION PANEL
ALBUMIN: 3.8 g/dL (ref 3.5–5.2)
ALT: 24 U/L (ref 0–53)
AST: 24 U/L (ref 0–37)
Alkaline Phosphatase: 41 U/L (ref 39–117)
Bilirubin, Direct: 0.1 mg/dL (ref 0.0–0.3)
Total Bilirubin: 0.8 mg/dL (ref 0.2–1.2)
Total Protein: 6.7 g/dL (ref 6.0–8.3)

## 2014-04-11 LAB — LIPID PANEL
Cholesterol: 95 mg/dL (ref 0–200)
HDL: 38.3 mg/dL — AB (ref >39.00)
LDL Cholesterol: 41 mg/dL (ref 0–99)
TRIGLYCERIDES: 78 mg/dL (ref 0.0–149.0)
Total CHOL/HDL Ratio: 2
VLDL: 15.6 mg/dL (ref 0.0–40.0)

## 2014-04-11 LAB — CBC WITH DIFFERENTIAL/PLATELET
BASOS ABS: 0 10*3/uL (ref 0.0–0.1)
Basophils Relative: 0.6 % (ref 0.0–3.0)
EOS ABS: 0.2 10*3/uL (ref 0.0–0.7)
Eosinophils Relative: 2.9 % (ref 0.0–5.0)
HCT: 41.3 % (ref 39.0–52.0)
Hemoglobin: 14 g/dL (ref 13.0–17.0)
LYMPHS PCT: 34.8 % (ref 12.0–46.0)
Lymphs Abs: 2.5 10*3/uL (ref 0.7–4.0)
MCHC: 33.9 g/dL (ref 30.0–36.0)
MCV: 92.8 fl (ref 78.0–100.0)
Monocytes Absolute: 0.7 10*3/uL (ref 0.1–1.0)
Monocytes Relative: 9.7 % (ref 3.0–12.0)
NEUTROS PCT: 52 % (ref 43.0–77.0)
Neutro Abs: 3.7 10*3/uL (ref 1.4–7.7)
Platelets: 246 10*3/uL (ref 150.0–400.0)
RBC: 4.45 Mil/uL (ref 4.22–5.81)
RDW: 12.8 % (ref 11.5–15.5)
WBC: 7.1 10*3/uL (ref 4.0–10.5)

## 2014-04-11 LAB — BASIC METABOLIC PANEL
BUN: 19 mg/dL (ref 6–23)
CALCIUM: 9.1 mg/dL (ref 8.4–10.5)
CO2: 26 mEq/L (ref 19–32)
CREATININE: 1 mg/dL (ref 0.4–1.5)
Chloride: 107 mEq/L (ref 96–112)
GFR: 81.66 mL/min (ref >60.00)
Glucose, Bld: 175 mg/dL — ABNORMAL HIGH (ref 70–99)
Potassium: 4.1 mEq/L (ref 3.5–5.1)
Sodium: 140 mEq/L (ref 135–145)

## 2014-04-11 LAB — HEMOGLOBIN A1C: Hgb A1c MFr Bld: 8 % — ABNORMAL HIGH (ref 4.6–6.5)

## 2014-04-24 ENCOUNTER — Encounter: Payer: Self-pay | Admitting: Gastroenterology

## 2014-06-18 ENCOUNTER — Other Ambulatory Visit: Payer: Self-pay | Admitting: *Deleted

## 2014-06-18 MED ORDER — AMLODIPINE BESYLATE 5 MG PO TABS: 5.0000 mg | ORAL_TABLET | Freq: Every day | ORAL | Status: DC

## 2014-08-10 ENCOUNTER — Telehealth: Payer: Self-pay | Admitting: *Deleted

## 2014-08-10 NOTE — Telephone Encounter (Signed)
Patient requests printed rx for metoprolol so that he can take it to the [place]. He would like a call when this is ready to be picked up. Thanks, MI

## 2014-08-15 ENCOUNTER — Other Ambulatory Visit: Payer: Self-pay

## 2014-08-15 MED ORDER — METOPROLOL TARTRATE 25 MG PO TABS: ORAL_TABLET | ORAL | Status: DC

## 2014-08-15 NOTE — Telephone Encounter (Signed)
Printed out RX for Metoprolol will get Dr Harrington Challenger to sign 10.1.15 and I will place up front for the patient to pick up

## 2014-10-17 ENCOUNTER — Ambulatory Visit (INDEPENDENT_AMBULATORY_CARE_PROVIDER_SITE_OTHER): Payer: Medicare Other | Admitting: Internal Medicine

## 2014-10-17 ENCOUNTER — Encounter: Payer: Self-pay | Admitting: Internal Medicine

## 2014-10-17 ENCOUNTER — Other Ambulatory Visit (INDEPENDENT_AMBULATORY_CARE_PROVIDER_SITE_OTHER): Payer: Medicare Other

## 2014-10-17 ENCOUNTER — Ambulatory Visit (INDEPENDENT_AMBULATORY_CARE_PROVIDER_SITE_OTHER)
Admission: RE | Admit: 2014-10-17 | Discharge: 2014-10-17 | Disposition: A | Discharge status: Home / Self Care | Payer: Medicare Other | Source: Ambulatory Visit | Attending: Internal Medicine | Admitting: Internal Medicine

## 2014-10-17 VITALS — BP 150/80 | HR 73 | Temp 98.5°F | Wt 228.0 lb

## 2014-10-17 DIAGNOSIS — E119 Type 2 diabetes mellitus without complications: Secondary | ICD-10-CM

## 2014-10-17 DIAGNOSIS — R05 Cough: Secondary | ICD-10-CM

## 2014-10-17 DIAGNOSIS — R059 Cough, unspecified: Secondary | ICD-10-CM

## 2014-10-17 DIAGNOSIS — E785 Hyperlipidemia, unspecified: Secondary | ICD-10-CM

## 2014-10-17 DIAGNOSIS — I2510 Atherosclerotic heart disease of native coronary artery without angina pectoris: Secondary | ICD-10-CM

## 2014-10-17 LAB — BASIC METABOLIC PANEL
BUN: 21 mg/dL (ref 6–23)
CALCIUM: 9.1 mg/dL (ref 8.4–10.5)
CO2: 25 mEq/L (ref 19–32)
Chloride: 101 mEq/L (ref 96–112)
Creatinine, Ser: 1 mg/dL (ref 0.4–1.5)
GFR: 76.86 mL/min (ref >60.00)
GLUCOSE: 234 mg/dL — AB (ref 70–99)
Potassium: 4 mEq/L (ref 3.5–5.1)
SODIUM: 132 meq/L — AB (ref 135–145)

## 2014-10-17 LAB — HEMOGLOBIN A1C: Hgb A1c MFr Bld: 7.9 % — ABNORMAL HIGH (ref 4.6–6.5)

## 2014-10-17 LAB — GLUCOSE, POCT (MANUAL RESULT ENTRY): POC Glucose: 309 mg/dl — AB (ref 70–99)

## 2014-10-17 MED ORDER — METFORMIN HCL 500 MG PO TABS: 500.0000 mg | ORAL_TABLET | Freq: Three times a day (TID) | ORAL | Status: DC

## 2014-10-17 NOTE — Progress Notes (Signed)
Pre visit review using our clinic review tool, if applicable. No additional management support is needed unless otherwise documented below in the visit note. 

## 2014-10-17 NOTE — Assessment & Plan Note (Signed)
Continue with current prescription therapy as reflected on the Med list.  

## 2014-10-17 NOTE — Progress Notes (Signed)
   Subjective:     HPI  New pt  Former pt of Dr Leanne Chang  The patient presents for a follow-up of  chronic hypertension, CAD, chronic dyslipidemia, type 2 diabetes controlled with medicines  C/o cough x 1 mo   BP Readings from Last 3 Encounters:  10/17/14 150/80  04/06/14 142/77  11/06/13 135/76   Wt Readings from Last 3 Encounters:  10/17/14 228 lb (103.42 kg)  04/06/14 232 lb (105.235 kg)  11/06/13 229 lb (103.874 kg)        Review of Systems  Constitutional: Negative for appetite change, fatigue and unexpected weight change.  HENT: Positive for postnasal drip. Negative for congestion, nosebleeds, sneezing, sore throat and trouble swallowing.   Eyes: Negative for itching and visual disturbance.  Respiratory: Positive for cough.   Cardiovascular: Negative for chest pain, palpitations and leg swelling.  Gastrointestinal: Negative for nausea, diarrhea, blood in stool and abdominal distention.  Genitourinary: Negative for frequency and hematuria.  Musculoskeletal: Negative for back pain, joint swelling, gait problem and neck pain.  Skin: Negative for rash.  Neurological: Negative for dizziness, tremors, speech difficulty and weakness.  Psychiatric/Behavioral: Negative for sleep disturbance, dysphoric mood and agitation. The patient is not nervous/anxious.        Objective:   Physical Exam  Constitutional: He is oriented to person, place, and time. He appears well-developed. No distress.  Obese NAD  HENT:  Mouth/Throat: Oropharynx is clear and moist.  Eyes: Conjunctivae are normal. Pupils are equal, round, and reactive to light.  Neck: Normal range of motion. No JVD present. No thyromegaly present.  Cardiovascular: Normal rate, regular rhythm, normal heart sounds and intact distal pulses.  Exam reveals no gallop and no friction rub.   No murmur heard. Pulmonary/Chest: Effort normal and breath sounds normal. No respiratory distress. He has no wheezes. He has no  rales. He exhibits no tenderness.  Abdominal: Soft. Bowel sounds are normal. He exhibits no distension and no mass. There is no tenderness. There is no rebound and no guarding.  Musculoskeletal: Normal range of motion. He exhibits no edema or tenderness.  Lymphadenopathy:    He has no cervical adenopathy.  Neurological: He is alert and oriented to person, place, and time. He has normal reflexes. No cranial nerve deficit. He exhibits normal muscle tone. He displays a negative Romberg sign. Coordination and gait normal.  No meningeal signs  Skin: Skin is warm and dry. No rash noted.  Psychiatric: He has a normal mood and affect. His behavior is normal. Judgment and thought content normal.    Lab Results  Component Value Date   WBC 7.1 04/11/2014   HGB 14.0 04/11/2014   HCT 41.3 04/11/2014   PLT 246.0 04/11/2014   GLUCOSE 175* 04/11/2014   CHOL 95 04/11/2014   TRIG 78.0 04/11/2014   HDL 38.30* 04/11/2014   LDLCALC 41 04/11/2014   ALT 24 04/11/2014   AST 24 04/11/2014   NA 140 04/11/2014   K 4.1 04/11/2014   CL 107 04/11/2014   CREATININE 1.0 04/11/2014   BUN 19 04/11/2014   CO2 26 04/11/2014   TSH 1.41 01/03/2013   PSA 1.55 12/26/2010   INR 2.3 03/03/2012   HGBA1C 8.0* 04/11/2014         Assessment & Plan:

## 2014-10-17 NOTE — Assessment & Plan Note (Signed)
CXR ?etiology 

## 2014-11-27 ENCOUNTER — Encounter: Payer: Self-pay | Admitting: Gastroenterology

## 2014-12-17 ENCOUNTER — Other Ambulatory Visit: Payer: Self-pay | Admitting: *Deleted

## 2014-12-17 MED ORDER — SIMVASTATIN 20 MG PO TABS: 10.0000 mg | ORAL_TABLET | Freq: Every day | ORAL | Status: DC

## 2014-12-20 ENCOUNTER — Ambulatory Visit (AMBULATORY_SURGERY_CENTER): Payer: Self-pay | Admitting: *Deleted

## 2014-12-20 VITALS — Ht 68.0 in | Wt 228.0 lb

## 2014-12-20 DIAGNOSIS — Z86010 Personal history of colonic polyps: Secondary | ICD-10-CM

## 2014-12-20 MED ORDER — MOVIPREP 100 G PO SOLR: 1.0000 | Freq: Once | ORAL | Status: DC

## 2014-12-20 NOTE — Progress Notes (Signed)
No egg or soy allergy No diet pills No home 02 use\ No issues with past sedation, never told hard to intubate No e mail for emmi, no computer

## 2014-12-24 ENCOUNTER — Telehealth: Payer: Self-pay | Admitting: Gastroenterology

## 2014-12-24 NOTE — Telephone Encounter (Signed)
Patient states his moviprep was $80, he wants cheaper prep. Miralax instructions mailed to patient and explained to him. He will call us back if needed.

## 2015-01-03 ENCOUNTER — Ambulatory Visit (AMBULATORY_SURGERY_CENTER): Payer: Medicare Other | Admitting: Gastroenterology

## 2015-01-03 ENCOUNTER — Encounter: Payer: Self-pay | Admitting: Gastroenterology

## 2015-01-03 VITALS — BP 103/55 | HR 61 | Temp 97.6°F | Resp 19 | Ht 68.0 in | Wt 228.0 lb

## 2015-01-03 DIAGNOSIS — K621 Rectal polyp: Secondary | ICD-10-CM

## 2015-01-03 DIAGNOSIS — Z86010 Personal history of colonic polyps: Secondary | ICD-10-CM

## 2015-01-03 DIAGNOSIS — D128 Benign neoplasm of rectum: Secondary | ICD-10-CM

## 2015-01-03 LAB — GLUCOSE, CAPILLARY
GLUCOSE-CAPILLARY: 131 mg/dL — AB (ref 70–99)
Glucose-Capillary: 117 mg/dL — ABNORMAL HIGH (ref 70–99)

## 2015-01-03 MED ORDER — SODIUM CHLORIDE 0.9 % IV SOLN: 500.0000 mL | INTRAVENOUS | Status: DC

## 2015-01-03 NOTE — Progress Notes (Signed)
A/ox3 pleased with MAC, report to April RN 

## 2015-01-03 NOTE — Op Note (Signed)
[facility]  [address] [place], [address]   COLONOSCOPY PROCEDURE REPORT  PATIENT: John, [name]  MR#: [id number] BIRTHDATE: Sep 30, 1935 , 28  yrs. old GENDER: male ENDOSCOPIST: Ladene Artist, MD, [facility] PROCEDURE DATE:  01/03/2015 PROCEDURE:   Colonoscopy with snare polypectomy First Screening Colonoscopy - Avg.  risk and is 50 yrs.  old or older - No.  Prior Negative Screening - Now for repeat screening. N/A  History of Adenoma - Now for follow-up colonoscopy & has been > or = to 3 yrs.  Yes hx of adenoma.  Has been 3 or more years since last colonoscopy.  Polyps Removed Today? Yes. ASA CLASS:   Class II INDICATIONS:follow up of adenomatous colonic polyp(s). MEDICATIONS: Monitored anesthesia care and Propofol 170 mg IV DESCRIPTION OF PROCEDURE:   After the risks benefits and alternatives of the procedure were thoroughly explained, informed consent was obtained.  The digital rectal exam revealed no abnormalities of the rectum.   The [id number]  endoscope was introduced through the anus and advanced to the cecum, which was identified by both the appendix and ileocecal valve. No adverse events experienced.   The quality of the prep was good, using MoviPrep  The instrument was then slowly withdrawn as the colon was fully examined.    COLON FINDINGS: A sessile polyp measuring 5 mm in size was found in the rectum.  A polypectomy was performed with a cold snare.  The resection was complete, the polyp tissue was completely retrieved and sent to histology.   There was mild diverticulosis noted in the sigmoid colon.   The examination was otherwise normal.  Retroflexed views revealed no abnormalities. The time to cecum=1 minutes 44 seconds.  Withdrawal time=9 minutes 52 seconds.  The scope was withdrawn and the procedure completed. COMPLICATIONS: There were no immediate complications.  ENDOSCOPIC IMPRESSION: 1.   Sessile polyp in the rectum;  polypectomy performed with a cold snare 2.   Mild diverticulosis in the sigmoid colon  RECOMMENDATIONS: 1.  Await pathology results 2.  High fiber diet with liberal fluid intake. 3.  Given your age, you will not need another colonoscopy for colon cancer screening or polyp surveillance.  These types of tests usually stop around the age 15.  eSigned:  Ladene Artist, MD, [facility] 01/03/2015 9:32 AM

## 2015-01-03 NOTE — Progress Notes (Signed)
Called to room to assist during endoscopic procedure.  Patient ID and intended procedure confirmed with present staff. Received instructions for my participation in the procedure from the performing physician.  

## 2015-01-03 NOTE — Patient Instructions (Addendum)
YOU HAD AN ENDOSCOPIC PROCEDURE TODAY AT THE [facility]: Refer to the procedure report that was given to you for any specific questions about what was found during the examination.  If the procedure report does not answer your questions, please call your gastroenterologist to clarify.  If you requested that your care partner not be given the details of your procedure findings, then the procedure report has been included in a sealed envelope for you to review at your convenience later.  YOU SHOULD EXPECT: Some feelings of bloating in the abdomen. Passage of more gas than usual.  Walking can help get rid of the air that was put into your GI tract during the procedure and reduce the bloating. If you had a lower endoscopy (such as a colonoscopy or flexible sigmoidoscopy) you may notice spotting of blood in your stool or on the toilet paper. If you underwent a bowel prep for your procedure, then you may not have a normal bowel movement for a few days.  DIET: Your first meal following the procedure should be a light meal and then it is ok to progress to your normal diet.  A half-sandwich or bowl of soup is an example of a good first meal.  Heavy or fried foods are harder to digest and may make you feel nauseous or bloated.  Likewise meals heavy in dairy and vegetables can cause extra gas to form and this can also increase the bloating.  Drink plenty of fluids but you should avoid alcoholic beverages for 24 hours.  ACTIVITY: Your care partner should take you home directly after the procedure.  You should plan to take it easy, moving slowly for the rest of the day.  You can resume normal activity the day after the procedure however you should NOT DRIVE or use heavy machinery for 24 hours (because of the sedation medicines used during the test).    SYMPTOMS TO REPORT IMMEDIATELY: A gastroenterologist can be reached at any hour.  During normal business hours, 8:30 AM to 5:00 PM Monday through Friday,  call [phone].  After hours and on weekends, please call the GI answering service at [phone] who will take a message and have the physician on call contact you.   Following lower endoscopy (colonoscopy or flexible sigmoidoscopy):  Excessive amounts of blood in the stool  Significant tenderness or worsening of abdominal pains  Swelling of the abdomen that is new, acute  Fever of 100F or higher  FOLLOW UP: If any biopsies were taken you will be contacted by phone or by letter within the next 1-3 weeks.  Call your gastroenterologist if you have not heard about the biopsies in 3 weeks.  Our staff will call the home number listed on your records the next business day following your procedure to check on you and address any questions or concerns that you may have at that time regarding the information given to you following your procedure. This is a courtesy call and so if there is no answer at the home number and we have not heard from you through the emergency physician on call, we will assume that you have returned to your regular daily activities without incident.  SIGNATURES/CONFIDENTIALITY: You and/or your care partner have signed paperwork which will be entered into your electronic medical record.  These signatures attest to the fact that that the information above on your After Visit Summary has been reviewed and is understood.  Full responsibility of the confidentiality of this   discharge information lies with you and/or your care-partner.  Polyps, diverticulosis, high fiber diet-handouts given

## 2015-01-04 ENCOUNTER — Telehealth: Payer: Self-pay | Admitting: *Deleted

## 2015-01-04 NOTE — Telephone Encounter (Signed)
  Follow up Call-  Call back number 01/03/2015  Post procedure Call Back phone  # [phone]  Permission to leave phone message Yes     Patient questions:  Do you have a fever, pain , or abdominal swelling? No. Pain Score  0 *  Have you tolerated food without any problems? Yes.    Have you been able to return to your normal activities? Yes.    Do you have any questions about your discharge instructions: Diet   No. Medications  No. Follow up visit  No.  Do you have questions or concerns about your Care? No.  Actions: * If pain score is 4 or above: No action needed, pain <4.

## 2015-01-08 ENCOUNTER — Encounter: Payer: Self-pay | Admitting: Gastroenterology

## 2015-01-16 ENCOUNTER — Encounter: Payer: Self-pay | Admitting: Internal Medicine

## 2015-01-16 ENCOUNTER — Other Ambulatory Visit: Payer: Medicare Other

## 2015-01-16 ENCOUNTER — Other Ambulatory Visit (INDEPENDENT_AMBULATORY_CARE_PROVIDER_SITE_OTHER): Payer: Medicare Other

## 2015-01-16 ENCOUNTER — Ambulatory Visit (INDEPENDENT_AMBULATORY_CARE_PROVIDER_SITE_OTHER): Payer: Medicare Other | Admitting: Internal Medicine

## 2015-01-16 VITALS — BP 138/70 | HR 70 | Temp 98.6°F | Wt 227.0 lb

## 2015-01-16 DIAGNOSIS — E1122 Type 2 diabetes mellitus with diabetic chronic kidney disease: Secondary | ICD-10-CM

## 2015-01-16 DIAGNOSIS — I2510 Atherosclerotic heart disease of native coronary artery without angina pectoris: Secondary | ICD-10-CM

## 2015-01-16 DIAGNOSIS — I10 Essential (primary) hypertension: Secondary | ICD-10-CM

## 2015-01-16 DIAGNOSIS — N189 Chronic kidney disease, unspecified: Secondary | ICD-10-CM

## 2015-01-16 LAB — BASIC METABOLIC PANEL
BUN: 19 mg/dL (ref 6–23)
CHLORIDE: 103 meq/L (ref 96–112)
CO2: 28 meq/L (ref 19–32)
Calcium: 9.1 mg/dL (ref 8.4–10.5)
Creatinine, Ser: 0.98 mg/dL (ref 0.40–1.50)
GFR: 78.63 mL/min (ref >60.00)
GLUCOSE: 154 mg/dL — AB (ref 70–99)
POTASSIUM: 4 meq/L (ref 3.5–5.1)
Sodium: 137 mEq/L (ref 135–145)

## 2015-01-16 LAB — HEMOGLOBIN A1C: Hgb A1c MFr Bld: 7.3 % — ABNORMAL HIGH (ref 4.6–6.5)

## 2015-01-16 NOTE — Assessment & Plan Note (Addendum)
On ASA, Metoprolol, Simvastatin and Norvasc - cont Rx

## 2015-01-16 NOTE — Assessment & Plan Note (Signed)
Cont Metoprolol, Simvastatin and Norvasc

## 2015-01-16 NOTE — Assessment & Plan Note (Signed)
Cont w/Metformin 

## 2015-01-16 NOTE — Progress Notes (Signed)
Pre visit review using our clinic review tool, if applicable. No additional management support is needed unless otherwise documented below in the visit note. 

## 2015-01-17 ENCOUNTER — Encounter: Payer: Self-pay | Admitting: Internal Medicine

## 2015-01-17 ENCOUNTER — Ambulatory Visit (INDEPENDENT_AMBULATORY_CARE_PROVIDER_SITE_OTHER): Payer: Medicare Other | Admitting: Internal Medicine

## 2015-01-17 VITALS — BP 130/70 | HR 59 | Ht 68.0 in | Wt 228.1 lb

## 2015-01-17 DIAGNOSIS — I10 Essential (primary) hypertension: Secondary | ICD-10-CM

## 2015-01-17 DIAGNOSIS — E785 Hyperlipidemia, unspecified: Secondary | ICD-10-CM

## 2015-01-17 DIAGNOSIS — I4891 Unspecified atrial fibrillation: Secondary | ICD-10-CM

## 2015-01-17 NOTE — Progress Notes (Signed)
Cardiology Office Note   Date:  01/17/2015   ID:  [name], DOB February 21, 1935, MRN [id number]  PCP:  Walker Kehr, MD  Cardiologist:   [name], MD   No chief complaint on file.     History of Present Illness: [name] is a 80 y.o. male with a history of CAD, status post CABG 01/2011, diabetes, hypertension, nephrolithiasis, hyperlipidemia, postoperative atrial fibrillation. LHC 01/12/11 prior to his CABG demonstrated left main and severe double vessel CAD. EF 45%. He had no significant disease in the RCA. Grafts at the time of his CABG: LIMA-distal LAD, SVG-circumflex. He remained on Coumadin and amiodarone for some time postoperatively b/c of postoperative atrial fibrillation.  THe patient was last in clinic in may 2015 He says since seen his breathing has been OK  No CP  No dizziness      Current Outpatient Prescriptions  Medication Sig Dispense Refill  . amLODipine (NORVASC) 5 MG tablet Take 1 tablet (5 mg total) by mouth daily. 30 tablet 6  . aspirin 325 MG tablet Take 325 mg by mouth daily.      . cholecalciferol (VITAMIN D) 1000 UNITS tablet Take 1,000 Units by mouth daily.    . metFORMIN (GLUCOPHAGE) 500 MG tablet Take 1 tablet (500 mg total) by mouth 3 (three) times daily. 90 tablet 11  . metoprolol tartrate (LOPRESSOR) 25 MG tablet TAKE 1 TABLET BY MOUTH TWICE A DAY (Patient taking differently: Take 25 mg by mouth daily. TAKE 1 TABLET BY MOUTH TWICE A DAY) 60 tablet 6  . simvastatin (ZOCOR) 20 MG tablet Take 0.5 tablets (10 mg total) by mouth at bedtime. 45 tablet 0   No current facility-administered medications for this visit.    Allergies:   Review of patient's allergies indicates no known allergies.   Past Medical History  Diagnosis Date  . CARCINOMA, SKIN, SQUAMOUS CELL 01/07/2010  . COLONIC POLYPS, HX OF 04/26/2007  . DIABETES MELLITUS, TYPE II 04/26/2007  . HYPERTENSION 04/26/2007  . NEPHROLITHIASIS, HX OF 04/26/2007  . Unspecified hearing  loss 12/18/2008  . Coronary artery disease     s/p CABG 3/12: L-LAD, S-CFX (Dr. Roxy Manns); EF 45% at cath prior to CABG  . Hyperlipidemia   . Atrial fibrillation     post op; amiodarone and coumadin continued for 3 mos post op  . Arthritis   . Cataract     beginning of cataracts    Past Surgical History  Procedure Laterality Date  . Tympanic membrane repair    . Back surgery    . Total hip arthroplasty      left on 04/15/07  . Coronary artery bypass graft    . Colonoscopy       Social History:  The patient  reports that he quit smoking about 34 years ago. He has never used smokeless tobacco. He reports that he does not drink alcohol or use illicit drugs.   Family History:  The patient's family history includes Breast cancer in his sister; Cancer in his brother and sister; Heart disease in his brother and father; Heart failure in his mother. There is no history of Colon cancer.    ROS:  Please see the history of present illness. All other systems are reviewed and  Negative to the above problem except as noted.    PHYSICAL EXAM: VS:  BP 130/70 mmHg  Pulse 59  Ht 5\' 8"  (1.727 m)  Wt 228 lb 1.9 oz (103.475 kg)  BMI 34.69  kg/m2  GEN: Well nourished, well developed, in no acute distress HEENT: normal Neck: no JVD, carotid bruits, or masses Cardiac: RRR; no murmurs, rubs, or gallops,no edema  Respiratory:  clear to auscultation bilaterally, normal work of breathing GI: soft, nontender, nondistended, + BS  No hepatomegaly  MS: no deformity Moving all extremities   Skin: warm and dry, no rash Neuro:  Strength and sensation are intact Psych: euthymic mood, full affect   EKG:  EKG is ordered today.  SB 59 bpm  First degree AV block  PR 268 msec  ANterolateral MI     Lipid Panel    Component Value Date/Time   CHOL 95 04/11/2014 0806   TRIG 78.0 04/11/2014 0806   TRIG 78 11/05/2006 1123   HDL 38.30* 04/11/2014 0806   CHOLHDL 2 04/11/2014 0806   CHOLHDL 3.6 CALC 11/05/2006 1123     VLDL 15.6 04/11/2014 0806   LDLCALC 41 04/11/2014 0806      Wt Readings from Last 3 Encounters:  01/17/15 228 lb 1.9 oz (103.475 kg)  01/16/15 227 lb (102.967 kg)  01/03/15 228 lb (103.42 kg)      ASSESSMENT AND PLAN:  1.  CAD  No symptoms to sugg angina  Keep on same regimen  2.  HTN  Good control    3.  HL  Will set up for lipids in July  Encouraged him t oincrease his activity  Try to lose wt     Current medicines are reviewed at length with the patient today.  The patient does not have concerns regarding medicines.  The following changes have been made: No Change  Labs/ tests ordered today include:  Lipids in July No orders of the defined types were placed in this encounter.     Disposition:   FU with  Me in 1 year  Signed, [name], MD  01/17/2015 2:18 PM    [address], [place], [place]  [address] Phone: [phone]; Fax: [phone]

## 2015-01-17 NOTE — Patient Instructions (Signed)
Your physician recommends that you continue on your current medications as directed. Please refer to the Current Medication list given to you today.  Your physician recommends that you return for lab work in: July at your PCP appointment (LIPIDS)  Your physician wants you to follow-up in: 1 year with Dr. Harrington Challenger.  You will receive a reminder letter in the mail two months in advance. If you don't receive a letter, please call our office to schedule the follow-up appointment.

## 2015-01-18 ENCOUNTER — Telehealth: Payer: Self-pay | Admitting: Internal Medicine

## 2015-01-18 NOTE — Telephone Encounter (Signed)
emmi mailed  °

## 2015-02-25 ENCOUNTER — Telehealth: Payer: Self-pay | Admitting: *Deleted

## 2015-02-25 ENCOUNTER — Other Ambulatory Visit: Payer: Self-pay | Admitting: *Deleted

## 2015-02-25 MED ORDER — METOPROLOL TARTRATE 25 MG PO TABS: 25.0000 mg | ORAL_TABLET | Freq: Every day | ORAL | Status: DC

## 2015-02-25 NOTE — Telephone Encounter (Signed)
Patient called to request a refill on metoprolol. His chart indicates that he takes 25mg  bid, but he stated that Dr Harrington Challenger reduced his dose to 25mg  qd last year. I do not see this in his chart, but he tells me that he has been taking 25mg  qd for at least a year with no problems. Please advise. Thanks, MI

## 2015-02-25 NOTE — Telephone Encounter (Signed)
Take 25 mg po 1x per day

## 2015-02-25 NOTE — Telephone Encounter (Signed)
Updated rx sent in for patient.

## 2015-03-01 ENCOUNTER — Other Ambulatory Visit: Payer: Self-pay | Admitting: Internal Medicine

## 2015-03-14 ENCOUNTER — Other Ambulatory Visit: Payer: Self-pay | Admitting: Internal Medicine

## 2015-03-16 DIAGNOSIS — M47817 Spondylosis without myelopathy or radiculopathy, lumbosacral region: Secondary | ICD-10-CM | POA: Diagnosis not present

## 2015-03-16 DIAGNOSIS — M4806 Spinal stenosis, lumbar region: Secondary | ICD-10-CM | POA: Diagnosis not present

## 2015-03-25 ENCOUNTER — Other Ambulatory Visit: Payer: Self-pay | Admitting: *Deleted

## 2015-03-25 MED ORDER — AMLODIPINE BESYLATE 5 MG PO TABS: 5.0000 mg | ORAL_TABLET | Freq: Every day | ORAL | Status: DC

## 2015-03-30 DIAGNOSIS — M47817 Spondylosis without myelopathy or radiculopathy, lumbosacral region: Secondary | ICD-10-CM | POA: Diagnosis not present

## 2015-04-10 DIAGNOSIS — M4806 Spinal stenosis, lumbar region: Secondary | ICD-10-CM | POA: Diagnosis not present

## 2015-04-18 DIAGNOSIS — M5416 Radiculopathy, lumbar region: Secondary | ICD-10-CM | POA: Diagnosis not present

## 2015-04-18 DIAGNOSIS — M4727 Other spondylosis with radiculopathy, lumbosacral region: Secondary | ICD-10-CM | POA: Diagnosis not present

## 2015-04-18 DIAGNOSIS — M4806 Spinal stenosis, lumbar region: Secondary | ICD-10-CM | POA: Diagnosis not present

## 2015-04-19 ENCOUNTER — Telehealth: Payer: Self-pay | Admitting: Internal Medicine

## 2015-04-19 NOTE — Telephone Encounter (Signed)
Walk in pt form-[facility] Clearance-dropped off gave to Message Nurse

## 2015-04-23 ENCOUNTER — Telehealth: Payer: Self-pay | Admitting: Internal Medicine

## 2015-04-23 NOTE — Telephone Encounter (Signed)
Dr.Ross signed [employer], I called patient to make him aware and he stated just to fax Clearance he did not need to pick it up Clearance Faxed to [phone]

## 2015-04-25 ENCOUNTER — Encounter: Payer: Self-pay | Admitting: Gastroenterology

## 2015-05-07 ENCOUNTER — Encounter (HOSPITAL_COMMUNITY): Payer: Self-pay | Admitting: *Deleted

## 2015-05-07 DIAGNOSIS — M5416 Radiculopathy, lumbar region: Secondary | ICD-10-CM | POA: Diagnosis not present

## 2015-05-07 DIAGNOSIS — M4806 Spinal stenosis, lumbar region: Secondary | ICD-10-CM | POA: Diagnosis not present

## 2015-05-07 MED ORDER — CEFAZOLIN SODIUM-DEXTROSE 2-3 GM-% IV SOLR
2.0000 g | INTRAVENOUS | Status: AC
  Administered 2015-05-08: 2 g via INTRAVENOUS
  Filled 2015-05-07: qty 50

## 2015-05-07 NOTE — Progress Notes (Signed)
   05/07/15 1511  OBSTRUCTIVE SLEEP APNEA  Have you ever been diagnosed with sleep apnea through a sleep study? No  Do you snore loudly (loud enough to be heard through closed doors)?  1  Do you often feel tired, fatigued, or sleepy during the daytime? 1  Has anyone observed you stop breathing during your sleep? 0  Do you have, or are you being treated for high blood pressure? 1  Age over 78 years old? 1  Neck circumference greater than 40 cm/16 inches? 1 (17.5)  Gender: 1

## 2015-05-08 ENCOUNTER — Encounter (HOSPITAL_COMMUNITY)
Admission: RE | Disposition: A | Discharge status: Home / Self Care | Payer: Self-pay | Source: Ambulatory Visit | Attending: Orthopedic Surgery

## 2015-05-08 ENCOUNTER — Inpatient Hospital Stay (HOSPITAL_COMMUNITY): Payer: Medicare Other

## 2015-05-08 ENCOUNTER — Inpatient Hospital Stay (HOSPITAL_COMMUNITY)
Admission: RE | Admit: 2015-05-08 | Discharge: 2015-05-09 | DRG: 517 | Disposition: A | Discharge status: Home / Self Care | Payer: Medicare Other | Source: Ambulatory Visit | Attending: Orthopedic Surgery | Admitting: Orthopedic Surgery

## 2015-05-08 ENCOUNTER — Inpatient Hospital Stay (HOSPITAL_COMMUNITY): Payer: Medicare Other | Admitting: Anesthesiology

## 2015-05-08 ENCOUNTER — Encounter (HOSPITAL_COMMUNITY): Payer: Self-pay | Admitting: *Deleted

## 2015-05-08 DIAGNOSIS — Z85828 Personal history of other malignant neoplasm of skin: Secondary | ICD-10-CM

## 2015-05-08 DIAGNOSIS — Z79899 Other long term (current) drug therapy: Secondary | ICD-10-CM | POA: Diagnosis not present

## 2015-05-08 DIAGNOSIS — M4806 Spinal stenosis, lumbar region: Secondary | ICD-10-CM | POA: Diagnosis not present

## 2015-05-08 DIAGNOSIS — Z981 Arthrodesis status: Secondary | ICD-10-CM | POA: Diagnosis not present

## 2015-05-08 DIAGNOSIS — I10 Essential (primary) hypertension: Secondary | ICD-10-CM | POA: Diagnosis present

## 2015-05-08 DIAGNOSIS — I2510 Atherosclerotic heart disease of native coronary artery without angina pectoris: Secondary | ICD-10-CM | POA: Diagnosis not present

## 2015-05-08 DIAGNOSIS — E119 Type 2 diabetes mellitus without complications: Secondary | ICD-10-CM | POA: Diagnosis present

## 2015-05-08 DIAGNOSIS — Z951 Presence of aortocoronary bypass graft: Secondary | ICD-10-CM | POA: Diagnosis not present

## 2015-05-08 DIAGNOSIS — M549 Dorsalgia, unspecified: Secondary | ICD-10-CM | POA: Diagnosis present

## 2015-05-08 DIAGNOSIS — H9190 Unspecified hearing loss, unspecified ear: Secondary | ICD-10-CM | POA: Diagnosis not present

## 2015-05-08 DIAGNOSIS — Z7982 Long term (current) use of aspirin: Secondary | ICD-10-CM | POA: Diagnosis not present

## 2015-05-08 DIAGNOSIS — E785 Hyperlipidemia, unspecified: Secondary | ICD-10-CM | POA: Diagnosis not present

## 2015-05-08 DIAGNOSIS — M1990 Unspecified osteoarthritis, unspecified site: Secondary | ICD-10-CM | POA: Diagnosis not present

## 2015-05-08 DIAGNOSIS — Z4889 Encounter for other specified surgical aftercare: Secondary | ICD-10-CM | POA: Diagnosis not present

## 2015-05-08 DIAGNOSIS — Z87891 Personal history of nicotine dependence: Secondary | ICD-10-CM

## 2015-05-08 DIAGNOSIS — I4891 Unspecified atrial fibrillation: Secondary | ICD-10-CM | POA: Diagnosis not present

## 2015-05-08 DIAGNOSIS — Z419 Encounter for procedure for purposes other than remedying health state, unspecified: Secondary | ICD-10-CM

## 2015-05-08 DIAGNOSIS — Z9889 Other specified postprocedural states: Secondary | ICD-10-CM | POA: Diagnosis not present

## 2015-05-08 HISTORY — DX: Reserved for inherently not codable concepts without codable children: IMO0001

## 2015-05-08 HISTORY — PX: LUMBAR LAMINECTOMY/DECOMPRESSION MICRODISCECTOMY: SHX5026

## 2015-05-08 LAB — CBC
HEMATOCRIT: 42.6 % (ref 39.0–52.0)
HEMOGLOBIN: 15 g/dL (ref 13.0–17.0)
MCH: 31.1 pg (ref 26.0–34.0)
MCHC: 35.2 g/dL (ref 30.0–36.0)
MCV: 88.2 fL (ref 78.0–100.0)
PLATELETS: 261 10*3/uL (ref 150–400)
RBC: 4.83 MIL/uL (ref 4.22–5.81)
RDW: 12.7 % (ref 11.5–15.5)
WBC: 8.5 10*3/uL (ref 4.0–10.5)

## 2015-05-08 LAB — BASIC METABOLIC PANEL
Anion gap: 9 (ref 5–15)
BUN: 15 mg/dL (ref 6–20)
CALCIUM: 9.9 mg/dL (ref 8.9–10.3)
CHLORIDE: 103 mmol/L (ref 101–111)
CO2: 27 mmol/L (ref 22–32)
CREATININE: 1.06 mg/dL (ref 0.61–1.24)
Glucose, Bld: 164 mg/dL — ABNORMAL HIGH (ref 65–99)
Potassium: 4.1 mmol/L (ref 3.5–5.1)
Sodium: 139 mmol/L (ref 135–145)

## 2015-05-08 LAB — GLUCOSE, CAPILLARY
Glucose-Capillary: 170 mg/dL — ABNORMAL HIGH (ref 65–99)
Glucose-Capillary: 168 mg/dL — ABNORMAL HIGH (ref 65–99)
Glucose-Capillary: 261 mg/dL — ABNORMAL HIGH (ref 65–99)

## 2015-05-08 LAB — SURGICAL PCR SCREEN
MRSA, PCR: NEGATIVE
Staphylococcus aureus: NEGATIVE

## 2015-05-08 SURGERY — LUMBAR LAMINECTOMY/DECOMPRESSION MICRODISCECTOMY 1 LEVEL
Anesthesia: General | Site: Spine Lumbar

## 2015-05-08 MED ORDER — NEOSTIGMINE METHYLSULFATE 10 MG/10ML IV SOLN
INTRAVENOUS | Status: AC
  Filled 2015-05-08: qty 1

## 2015-05-08 MED ORDER — LIDOCAINE HCL (CARDIAC) 20 MG/ML IV SOLN
INTRAVENOUS | Status: DC | PRN
  Administered 2015-05-08: 60 mg via INTRAVENOUS

## 2015-05-08 MED ORDER — SUCCINYLCHOLINE CHLORIDE 20 MG/ML IJ SOLN
INTRAMUSCULAR | Status: DC | PRN
  Administered 2015-05-08: 120 mg via INTRAVENOUS

## 2015-05-08 MED ORDER — SODIUM CHLORIDE 0.9 % IJ SOLN: 3.0000 mL | INTRAMUSCULAR | Status: DC | PRN

## 2015-05-08 MED ORDER — INSULIN ASPART 100 UNIT/ML ~~LOC~~ SOLN
0.0000 [IU] | Freq: Every day | SUBCUTANEOUS | Status: DC
  Administered 2015-05-08: 3 [IU] via SUBCUTANEOUS

## 2015-05-08 MED ORDER — BUPIVACAINE-EPINEPHRINE (PF) 0.25% -1:200000 IJ SOLN
INTRAMUSCULAR | Status: AC
  Filled 2015-05-08: qty 30

## 2015-05-08 MED ORDER — INSULIN ASPART 100 UNIT/ML ~~LOC~~ SOLN
0.0000 [IU] | Freq: Three times a day (TID) | SUBCUTANEOUS | Status: DC
  Administered 2015-05-09: 2 [IU] via SUBCUTANEOUS
  Administered 2015-05-09 (×2): 3 [IU] via SUBCUTANEOUS

## 2015-05-08 MED ORDER — MENTHOL 3 MG MT LOZG: 1.0000 | LOZENGE | OROMUCOSAL | Status: DC | PRN

## 2015-05-08 MED ORDER — PROMETHAZINE HCL 25 MG/ML IJ SOLN: 6.2500 mg | INTRAMUSCULAR | Status: DC | PRN

## 2015-05-08 MED ORDER — METOPROLOL TARTRATE 25 MG PO TABS
25.0000 mg | ORAL_TABLET | Freq: Every day | ORAL | Status: DC
  Administered 2015-05-09: 25 mg via ORAL
  Filled 2015-05-08 (×2): qty 1

## 2015-05-08 MED ORDER — THROMBIN 20000 UNITS EX SOLR
CUTANEOUS | Status: DC | PRN
  Administered 2015-05-08: 20 mL via TOPICAL

## 2015-05-08 MED ORDER — NEOSTIGMINE METHYLSULFATE 10 MG/10ML IV SOLN
INTRAVENOUS | Status: DC | PRN
  Administered 2015-05-08: 3 mg via INTRAVENOUS

## 2015-05-08 MED ORDER — HYDROMORPHONE HCL 1 MG/ML IJ SOLN
INTRAMUSCULAR | Status: AC
  Filled 2015-05-08: qty 1

## 2015-05-08 MED ORDER — MUPIROCIN 2 % EX OINT
1.0000 "application " | TOPICAL_OINTMENT | Freq: Once | CUTANEOUS | Status: AC
  Administered 2015-05-08: 1 via TOPICAL
  Filled 2015-05-08: qty 22

## 2015-05-08 MED ORDER — FENTANYL CITRATE (PF) 250 MCG/5ML IJ SOLN
INTRAMUSCULAR | Status: AC
  Filled 2015-05-08: qty 5

## 2015-05-08 MED ORDER — ACETAMINOPHEN 10 MG/ML IV SOLN
INTRAVENOUS | Status: DC | PRN
  Administered 2015-05-08: 1000 mg via INTRAVENOUS

## 2015-05-08 MED ORDER — METFORMIN HCL 500 MG PO TABS
500.0000 mg | ORAL_TABLET | Freq: Three times a day (TID) | ORAL | Status: DC
  Administered 2015-05-08 – 2015-05-09 (×4): 500 mg via ORAL
  Filled 2015-05-08 (×5): qty 1

## 2015-05-08 MED ORDER — MEPERIDINE HCL 25 MG/ML IJ SOLN: 6.2500 mg | INTRAMUSCULAR | Status: DC | PRN

## 2015-05-08 MED ORDER — MORPHINE SULFATE 2 MG/ML IJ SOLN: 1.0000 mg | INTRAMUSCULAR | Status: DC | PRN

## 2015-05-08 MED ORDER — PHENYLEPHRINE HCL 10 MG/ML IJ SOLN
10.0000 mg | INTRAVENOUS | Status: DC | PRN
  Administered 2015-05-08: 10 ug/min via INTRAVENOUS

## 2015-05-08 MED ORDER — THROMBIN 20000 UNITS EX SOLR
CUTANEOUS | Status: AC
  Filled 2015-05-08: qty 20000

## 2015-05-08 MED ORDER — KETOROLAC TROMETHAMINE 30 MG/ML IJ SOLN
30.0000 mg | Freq: Once | INTRAMUSCULAR | Status: DC | PRN
Start: 2015-05-08 — End: 2015-05-08

## 2015-05-08 MED ORDER — HEMOSTATIC AGENTS (NO CHARGE) OPTIME
TOPICAL | Status: DC | PRN
  Administered 2015-05-08: 1 via TOPICAL

## 2015-05-08 MED ORDER — AMLODIPINE BESYLATE 5 MG PO TABS
5.0000 mg | ORAL_TABLET | Freq: Every day | ORAL | Status: DC
  Administered 2015-05-08 – 2015-05-09 (×2): 5 mg via ORAL
  Filled 2015-05-08 (×2): qty 1

## 2015-05-08 MED ORDER — LIDOCAINE HCL (CARDIAC) 20 MG/ML IV SOLN
INTRAVENOUS | Status: AC
  Filled 2015-05-08: qty 5

## 2015-05-08 MED ORDER — ONDANSETRON HCL 4 MG/2ML IJ SOLN: 4.0000 mg | INTRAMUSCULAR | Status: DC | PRN

## 2015-05-08 MED ORDER — METHOCARBAMOL 1000 MG/10ML IJ SOLN
500.0000 mg | Freq: Four times a day (QID) | INTRAVENOUS | Status: DC | PRN
  Filled 2015-05-08: qty 5

## 2015-05-08 MED ORDER — FENTANYL CITRATE (PF) 100 MCG/2ML IJ SOLN
INTRAMUSCULAR | Status: DC | PRN
  Administered 2015-05-08: 50 ug via INTRAVENOUS

## 2015-05-08 MED ORDER — CEFAZOLIN SODIUM 1-5 GM-% IV SOLN
1.0000 g | Freq: Three times a day (TID) | INTRAVENOUS | Status: AC
  Administered 2015-05-08 – 2015-05-09 (×2): 1 g via INTRAVENOUS
  Filled 2015-05-08 (×2): qty 50

## 2015-05-08 MED ORDER — ACETAMINOPHEN 10 MG/ML IV SOLN
1000.0000 mg | Freq: Four times a day (QID) | INTRAVENOUS | Status: AC
  Administered 2015-05-08 – 2015-05-09 (×3): 1000 mg via INTRAVENOUS
  Filled 2015-05-08 (×3): qty 100

## 2015-05-08 MED ORDER — PHENOL 1.4 % MT LIQD: 1.0000 | OROMUCOSAL | Status: DC | PRN

## 2015-05-08 MED ORDER — GLYCOPYRROLATE 0.2 MG/ML IJ SOLN
INTRAMUSCULAR | Status: DC | PRN
  Administered 2015-05-08: .4 mg via INTRAVENOUS

## 2015-05-08 MED ORDER — DEXAMETHASONE SODIUM PHOSPHATE 4 MG/ML IJ SOLN
INTRAMUSCULAR | Status: AC
  Filled 2015-05-08: qty 1

## 2015-05-08 MED ORDER — LACTATED RINGERS IV SOLN: INTRAVENOUS | Status: DC

## 2015-05-08 MED ORDER — HYDROMORPHONE HCL 1 MG/ML IJ SOLN
0.2500 mg | INTRAMUSCULAR | Status: DC | PRN
  Administered 2015-05-08 (×4): 0.5 mg via INTRAVENOUS

## 2015-05-08 MED ORDER — INSULIN ASPART 100 UNIT/ML ~~LOC~~ SOLN
0.0000 [IU] | SUBCUTANEOUS | Status: DC
  Administered 2015-05-08: 3 [IU] via SUBCUTANEOUS

## 2015-05-08 MED ORDER — SODIUM CHLORIDE 0.9 % IJ SOLN: 3.0000 mL | Freq: Two times a day (BID) | INTRAMUSCULAR | Status: DC

## 2015-05-08 MED ORDER — LACTATED RINGERS IV SOLN
INTRAVENOUS | Status: DC | PRN
  Administered 2015-05-08 (×2): via INTRAVENOUS

## 2015-05-08 MED ORDER — GLYCOPYRROLATE 0.2 MG/ML IJ SOLN
INTRAMUSCULAR | Status: AC
  Filled 2015-05-08: qty 2

## 2015-05-08 MED ORDER — METHOCARBAMOL 500 MG PO TABS
500.0000 mg | ORAL_TABLET | Freq: Four times a day (QID) | ORAL | Status: DC | PRN
  Administered 2015-05-08 – 2015-05-09 (×2): 500 mg via ORAL
  Filled 2015-05-08 (×3): qty 1

## 2015-05-08 MED ORDER — ONDANSETRON HCL 4 MG/2ML IJ SOLN
INTRAMUSCULAR | Status: AC
  Filled 2015-05-08: qty 2

## 2015-05-08 MED ORDER — BUPIVACAINE-EPINEPHRINE 0.25% -1:200000 IJ SOLN
INTRAMUSCULAR | Status: DC | PRN
  Administered 2015-05-08: 10 mL

## 2015-05-08 MED ORDER — ROCURONIUM BROMIDE 100 MG/10ML IV SOLN
INTRAVENOUS | Status: DC | PRN
  Administered 2015-05-08: 20 mg via INTRAVENOUS

## 2015-05-08 MED ORDER — DEXAMETHASONE SODIUM PHOSPHATE 10 MG/ML IJ SOLN
INTRAMUSCULAR | Status: DC | PRN
  Administered 2015-05-08: 4 mg via INTRAVENOUS

## 2015-05-08 MED ORDER — LACTATED RINGERS IV SOLN
Freq: Once | INTRAVENOUS | Status: AC
  Administered 2015-05-08: 11:00:00 via INTRAVENOUS

## 2015-05-08 MED ORDER — OXYCODONE HCL 5 MG PO TABS
10.0000 mg | ORAL_TABLET | ORAL | Status: DC | PRN
  Administered 2015-05-08 – 2015-05-09 (×6): 10 mg via ORAL
  Filled 2015-05-08 (×6): qty 2

## 2015-05-08 MED ORDER — PROPOFOL 10 MG/ML IV BOLUS
INTRAVENOUS | Status: AC
  Filled 2015-05-08: qty 20

## 2015-05-08 MED ORDER — ONDANSETRON HCL 4 MG/2ML IJ SOLN
INTRAMUSCULAR | Status: DC | PRN
  Administered 2015-05-08: 4 mg via INTRAVENOUS

## 2015-05-08 MED ORDER — PROPOFOL 10 MG/ML IV BOLUS
INTRAVENOUS | Status: DC | PRN
  Administered 2015-05-08: 150 mg via INTRAVENOUS

## 2015-05-08 MED ORDER — ACETAMINOPHEN 10 MG/ML IV SOLN
INTRAVENOUS | Status: AC
  Filled 2015-05-08: qty 100

## 2015-05-08 SURGICAL SUPPLY — 67 items
BUR EGG ELITE 4.0 (BURR) IMPLANT
BUR EGG ELITE 4.0MM (BURR)
BUR MATCHSTICK NEURO 3.0 LAGG (BURR) IMPLANT
CANISTER SUCTION 2500CC (MISCELLANEOUS) ×3 IMPLANT
CLOSURE STERI-STRIP 1/2X4 (GAUZE/BANDAGES/DRESSINGS) ×1
CLSR STERI-STRIP ANTIMIC 1/2X4 (GAUZE/BANDAGES/DRESSINGS) ×2 IMPLANT
CORDS BIPOLAR (ELECTRODE) ×1 IMPLANT
COVER SURGICAL LIGHT HANDLE (MISCELLANEOUS) ×3 IMPLANT
DRAIN CHANNEL 15F RND FF W/TCR (WOUND CARE) IMPLANT
DRAPE POUCH INSTRU U-SHP 10X18 (DRAPES) ×3 IMPLANT
DRAPE SURG 17X23 STRL (DRAPES) ×3 IMPLANT
DRAPE U-SHAPE 47X51 STRL (DRAPES) ×3 IMPLANT
DRSG MEPILEX BORDER 4X8 (GAUZE/BANDAGES/DRESSINGS) ×3 IMPLANT
DURAPREP 26ML APPLICATOR (WOUND CARE) ×3 IMPLANT
ELECT BLADE 4.0 EZ CLEAN MEGAD (MISCELLANEOUS)
ELECT CAUTERY BLADE 6.4 (BLADE) ×3 IMPLANT
ELECT PENCIL ROCKER SW 15FT (MISCELLANEOUS) ×3 IMPLANT
ELECT REM PT RETURN 9FT ADLT (ELECTROSURGICAL) ×3
ELECTRODE BLDE 4.0 EZ CLN MEGD (MISCELLANEOUS) IMPLANT
ELECTRODE REM PT RTRN 9FT ADLT (ELECTROSURGICAL) ×1 IMPLANT
EVACUATOR SILICONE 100CC (DRAIN) IMPLANT
GLOVE BIO SURGEON STRL SZ7 (GLOVE) ×2 IMPLANT
GLOVE BIOGEL M STER SZ 6 (GLOVE) ×2 IMPLANT
GLOVE BIOGEL PI IND STRL 6.5 (GLOVE) IMPLANT
GLOVE BIOGEL PI IND STRL 8 (GLOVE) ×1 IMPLANT
GLOVE BIOGEL PI IND STRL 8.5 (GLOVE) ×1 IMPLANT
GLOVE BIOGEL PI INDICATOR 6.5 (GLOVE) ×2
GLOVE BIOGEL PI INDICATOR 8 (GLOVE) ×6
GLOVE BIOGEL PI INDICATOR 8.5 (GLOVE) ×2
GLOVE ORTHO TXT STRL SZ7.5 (GLOVE) ×9 IMPLANT
GLOVE SS BIOGEL STRL SZ 8.5 (GLOVE) ×1 IMPLANT
GLOVE SUPERSENSE BIOGEL SZ 8.5 (GLOVE) ×4
GLOVE SURG SS PI 6.0 STRL IVOR (GLOVE) ×2 IMPLANT
GOWN STRL REUS W/TWL 2XL LVL3 (GOWN DISPOSABLE) ×8 IMPLANT
GRAFT DURAGEN MATRIX 1WX1L (Tissue) ×2 IMPLANT
KIT BASIN OR (CUSTOM PROCEDURE TRAY) ×3 IMPLANT
KIT ROOM TURNOVER OR (KITS) ×3 IMPLANT
NDL SPNL 18GX3.5 QUINCKE PK (NEEDLE) ×2 IMPLANT
NEEDLE 22X1 1/2 (OR ONLY) (NEEDLE) ×3 IMPLANT
NEEDLE SPNL 18GX3.5 QUINCKE PK (NEEDLE) ×6 IMPLANT
NERVE PROTECTOR NEURAWRAP 3MM (Tissue) IMPLANT
NS IRRIG 1000ML POUR BTL (IV SOLUTION) ×3 IMPLANT
PACK LAMINECTOMY ORTHO (CUSTOM PROCEDURE TRAY) ×3 IMPLANT
PACK UNIVERSAL I (CUSTOM PROCEDURE TRAY) ×3 IMPLANT
PAD ARMBOARD 7.5X6 YLW CONV (MISCELLANEOUS) ×10 IMPLANT
PATTIES SURGICAL .5 X.5 (GAUZE/BANDAGES/DRESSINGS) IMPLANT
PATTIES SURGICAL .5 X1 (DISPOSABLE) ×3 IMPLANT
PROTECTOR NRV 4X32 PEEL NERWRP (Tissue) IMPLANT
PRTC NRV 4X32 PEEL NEURAWRAP (Tissue) IMPLANT
SPONGE SURGIFOAM ABS GEL 100 (HEMOSTASIS) IMPLANT
SURGIFLO TRUKIT (HEMOSTASIS) ×2 IMPLANT
SUT BONE WAX W31G (SUTURE) ×3 IMPLANT
SUT MON AB 3-0 SH 27 (SUTURE) ×3
SUT MON AB 3-0 SH27 (SUTURE) ×1 IMPLANT
SUT VIC AB 0 CT1 27 (SUTURE) ×3
SUT VIC AB 0 CT1 27XBRD ANBCTR (SUTURE) ×1 IMPLANT
SUT VIC AB 1 CT1 18XCR BRD 8 (SUTURE) ×1 IMPLANT
SUT VIC AB 1 CT1 8-18 (SUTURE) ×3
SUT VIC AB 1 CTX 36 (SUTURE) ×6
SUT VIC AB 1 CTX36XBRD ANBCTR (SUTURE) ×2 IMPLANT
SUT VIC AB 2-0 CT1 18 (SUTURE) ×3 IMPLANT
SYR BULB IRRIGATION 50ML (SYRINGE) ×3 IMPLANT
SYR CONTROL 10ML LL (SYRINGE) ×3 IMPLANT
TOWEL OR 17X24 6PK STRL BLUE (TOWEL DISPOSABLE) ×3 IMPLANT
TOWEL OR 17X26 10 PK STRL BLUE (TOWEL DISPOSABLE) ×3 IMPLANT
WATER STERILE IRR 1000ML POUR (IV SOLUTION) ×3 IMPLANT
YANKAUER SUCT BULB TIP NO VENT (SUCTIONS) ×3 IMPLANT

## 2015-05-08 NOTE — Anesthesia Preprocedure Evaluation (Signed)
Anesthesia Evaluation  Patient identified by MRN, date of birth, ID band Patient awake    Reviewed: Allergy & Precautions, NPO status , Patient's Chart, lab work & pertinent test results, reviewed documented beta blocker date and time   Airway Mallampati: II  TM Distance: >3 FB Neck ROM: Full    Dental no notable dental hx.    Pulmonary shortness of breath, former smoker,  breath sounds clear to auscultation  Pulmonary exam normal       Cardiovascular hypertension, Pt. on medications and Pt. on home beta blockers + CAD Normal cardiovascular examRhythm:Regular Rate:Normal     Neuro/Psych negative neurological ROS  negative psych ROS   GI/Hepatic negative GI ROS, Neg liver ROS,   Endo/Other  diabetes, Type 2, Oral Hypoglycemic Agents  Renal/GU negative Renal ROS     Musculoskeletal  (+) Arthritis -,   Abdominal   Peds  Hematology negative hematology ROS (+)   Anesthesia Other Findings   Reproductive/Obstetrics negative OB ROS                             Anesthesia Physical Anesthesia Plan  ASA: II  Anesthesia Plan: General   Post-op Pain Management:    Induction: Intravenous  Airway Management Planned: Oral ETT  Additional Equipment:   Intra-op Plan:   Post-operative Plan: Extubation in OR  Informed Consent: I have reviewed the patients History and Physical, chart, labs and discussed the procedure including the risks, benefits and alternatives for the proposed anesthesia with the patient or authorized representative who has indicated his/her understanding and acceptance.   Dental advisory given  Plan Discussed with: CRNA  Anesthesia Plan Comments:         Anesthesia Quick Evaluation

## 2015-05-08 NOTE — Anesthesia Postprocedure Evaluation (Signed)
  Anesthesia Post-op Note  Patient: [name]  Procedure(s) Performed: Procedure(s): LUMBAR LAMINECTOMY/DECOMPRESSION MICRODISCECTOMY 1 LEVEL Lumbar four five (N/A)  Patient Location: PACU  Anesthesia Type:General  Level of Consciousness: awake, alert , oriented and patient cooperative  Airway and Oxygen Therapy: Patient Spontanous Breathing and Patient connected to nasal cannula oxygen  Post-op Pain: none  Post-op Assessment: Post-op Vital signs reviewed, Patient's Cardiovascular Status Stable, Respiratory Function Stable, Patent Airway, No signs of Nausea or vomiting and Pain level controlled              Post-op Vital Signs: Reviewed and stable  Last Vitals:  Filed Vitals:   05/08/15 1545  BP: 139/57  Pulse: 63  Temp:   Resp: 19    Complications: No apparent anesthesia complications

## 2015-05-08 NOTE — H&P (Signed)
History of Present Illness  The patient is a 80 year old male who presents to the practice today for a transition into care. The patient is transitioning into care from another physician (Dr Suella Broad who has been treating for back pain and referred pt to Dr Rolena Infante) .  Additional reasons for visit:  Follow-up back is described as the following: The patient is being followed for their back pain. They are now 3 month(s) out from when symptoms began (In March after raking leaves). Symptoms reported today include: pain (to the left), aching, throbbing, catching, pain with weightbearing, weakness ("when the pain hits"), leg pain (left leg, posteriorly to the mid thigh), pain with lying, pain with sitting and pain with standing, while the patient does not report symptoms of: popping, grinding, giving way, instability, numbness or pain with lifting. The patient states that they are doing poorly. Current treatment includes: NSAIDs (ibuprofen) 600mg  bid. The following medication has been used for pain control: antiinflammatory medication which is helpful. The patient reports their current pain level to be 4-5 / 10 (right now). The patient presents today following MRI (that was done at [facility]). The patient has reported improvement of their symptoms with: Cortisone injections (Dr Nelva Bush has injected the back "it did not help"). The last injection was several years ago. The pt reports a long hx of LBP. A few months ago he started to have LLE pain that radiates posterior to above his knee. He finds walking can be aggravating and alleving. He finds rest and laying down alleviating. He reports he has some amount of pain constantly.  Allergies No Known Drug Allergies09/24/2012  Family History Heart Disease father Cancer Brother, Sister. sister and brother Congestive Heart Failure Mother. mother Diabetes Mellitus Mother. Hypertension Mother. mother and father  Social History  Tobacco use Former smoker.  03/16/2015: smoke(d) 2 pack(s) per day uses less than 1/2 can(s) smokeless per week former smoker; smoke(d) 2 pack(s) per day Tobacco / smoke exposure 03/16/2015: no no Pain Contract no Illicit drug use no Copy of Drug/Alcohol Rehab (Previously) no Alcohol use former drinker Drug/Alcohol Rehab (Currently) no Children 2 Current work status retired Games developer rarely; does other Former drinker 03/16/2015: In the past drank Living situation live with spouse Marital status married No history of drug/alcohol rehab Not under pain contract Number of flights of stairs before winded 1  Medication History Metoprolol Tartrate (25MG  Tablet, Oral) Active. (qd) Simvastatin (20MG  Tablet, Oral) Active. (qd) Aspirin (325MG  Tablet, Oral) Active. (qd) MetFORMIN HCl (500MG  Tablet, Oral) Active. (tid) AmLODIPine Besylate (10MG  Tablet, Oral) Active. (qd) Vitamin D (1000UNIT Capsule, Oral) Active. (bid) Medications Reconciled   Physical Exam  General General Appearance-Not in acute distress. Orientation-Oriented X3. Build & Nutrition-Well nourished and Well developed.  Integumentary General Characteristics Surgical Scars - no surgical scar evidence of previous lumbar surgery. Lumbar Spine-Skin examination of the lumbar spine is without deformity, skin lesions, lacerations or abrasions.  Abdomen Palpation/Percussion Palpation and Percussion of the abdomen reveal - Soft, Non Tender and No Rebound tenderness.  Peripheral Vascular Lower Extremity Palpation - Posterior tibial pulse - Bilateral - 2+. Dorsalis pedis pulse - Bilateral - 2+.  Neurologic Sensation Lower Extremity - Bilateral - sensation is intact in the lower extremity. Reflexes Patellar Reflex - Bilateral - 2+. Achilles Reflex - Bilateral - 2+. Clonus - Bilateral - clonus not present. Hoffman's Sign - Bilateral - Hoffman's sign not present. Testing Seated Straight Leg Raise - Bilateral -  Seated straight leg raise negative.  Musculoskeletal  Spine/Ribs/Pelvis  Lumbosacral Spine: Inspection and Palpation - Tenderness - no soft tissue tenderness to palpation and no bony tenderness to palpation, bony and soft tissue palpation of the lumbar spine and SI joint does not recreate their typical pain. Strength and Tone: Strength - Hip Flexion - Bilateral - 5/5. Knee Extension - Bilateral - 5/5. Knee Flexion - Bilateral - 5/5. Ankle Dorsiflexion - Bilateral - 5/5. Ankle Plantarflexion - Bilateral - 5/5. Heel walk - Bilateral - able to heel walk with moderate difficulty. Toe Walk - Bilateral - able to walk on toes with moderate difficulty. Heel-Toe Walk - Bilateral - able to heel-toe walk without difficulty. ROM - Flexion - moderately decreased range of motion and painful. Extension - moderately decreased range of motion. Left Lateral Bending - moderately decreased range of motion. Right Lateral Bending - moderately decreased range of motion. Pain - flexion is more painful than extension. Lumbosacral Spine - Waddell's Signs - no Waddell's signs present. Lower Extremity Range of Motion - No true hip, knee or ankle pain with range of motion. Gait and Station - [employer] - no assistive devices.  Assessment & Plan Posterior Lumbar Decompression/disectomy: Risks of surgery include infection, bleeding, nerve damage, death, stroke, paralysis, failure to heal, need for further surgery, ongoing or worse pain, need for further surgery, CSF leak, loss of bowel or bladder, and recurrent disc herniation or Stenosis which would necessitate need for further surgery. Goal Of Surgery:Discussed that goal of surgery is to reduce pain and improve function and quality of life. Patient is aware that despite all appropriate treatment that there pain and function could be the same, worse, or different.  At this point in time, Teddrick has severe spinal stenosis at L4-5. There is no significant collapse of the disc  space on his MRI, but his x-rays do show advanced degenerative disc disease. There is no scoliosis. He is having classic neurogenic claudication with extension related back pain that radiates into both lower extremities. No EHL, tibialis anterior, gastrocnemius weakness. Compartments are soft and nontender. 1+ dorsalis pedis, posterior tibialis pulses. At this point in time, given the severity of his stenosis, I do not see the advantage of injection therapy. His quality of life has suffered significantly. He has severe pain and loss in function. We have talked about a straightforward lumbar decompression, which I think is the treatment of choice. This would be an L4-5 laminectomy for decompression of his spinal stenosis. All of his and his wife's questions were addressed. The risks were reviewed, which include infection, bleeding, nerve damage, death, stroke, paralysis, failure to heal and need for further surgery, ongoing or worse pain, loss of bowel or bladder control, recurrent spinal stenosis, leakage of spinal fluid. We will go ahead and get clearance from his cardiologist, Dr. Harrington Challenger, and his primary care physician, Dr. Alain Marion, and move forward with surgery in a timely basis.

## 2015-05-08 NOTE — Plan of Care (Signed)
Problem: Consults Goal: Diagnosis - Spinal Surgery Outcome: Completed/Met Date Met:  05/08/15 Lumbar Laminectomy (Complex)

## 2015-05-08 NOTE — Brief Op Note (Signed)
05/08/2015  2:56 PM  PATIENT:  [name]  80 y.o. male  PRE-OPERATIVE DIAGNOSIS:  lumbar spinal stenosis  POST-OPERATIVE DIAGNOSIS:  lumbar spinal stenosis   PROCEDURE:  Procedure(s): LUMBAR LAMINECTOMY/DECOMPRESSION MICRODISCECTOMY 1 LEVEL Lumbar four five (N/A)  SURGEON:  Surgeon(s) and Role:    * Melina Schools, MD - Primary  PHYSICIAN ASSISTANT:   ASSISTANTS: Carman Mayo   ANESTHESIA:   general  EBL:  Total I/O In: 1200 [I.V.:1200] Out: 150 [Blood:150]  BLOOD ADMINISTERED:none  DRAINS: none   LOCAL MEDICATIONS USED:  MARCAINE     SPECIMEN:  No Specimen  DISPOSITION OF SPECIMEN:  N/A  COUNTS:  YES  TOURNIQUET:  * No tourniquets in log *  DICTATION: .Other Dictation: Dictation Number [id number]  PLAN OF CARE: Admit to inpatient   PATIENT DISPOSITION:  PACU - hemodynamically stable.

## 2015-05-08 NOTE — Transfer of Care (Signed)
Immediate Anesthesia Transfer of Care Note  Patient: [name]  Procedure(s) Performed: Procedure(s): LUMBAR LAMINECTOMY/DECOMPRESSION MICRODISCECTOMY 1 LEVEL Lumbar four five (N/A)  Patient Location: PACU  Anesthesia Type:General  Level of Consciousness: awake, alert  and patient cooperative  Airway & Oxygen Therapy: Patient Spontanous Breathing and Patient connected to nasal cannula oxygen  Post-op Assessment: Report given to RN, Post -op Vital signs reviewed and stable and Patient moving all extremities  Post vital signs: Reviewed and stable  Last Vitals:  Filed Vitals:   05/08/15 1030  BP: 172/57  Pulse: 71  Temp: 36.8 C  Resp: 17    Complications: No apparent anesthesia complications

## 2015-05-08 NOTE — Anesthesia Procedure Notes (Signed)
Procedure Name: Intubation Date/Time: 05/08/2015 1:31 PM Performed by: Izora Gala Pre-anesthesia Checklist: Patient identified, Emergency Drugs available, Suction available and Patient being monitored Patient Re-evaluated:Patient Re-evaluated prior to inductionOxygen Delivery Method: Circle system utilized Preoxygenation: Pre-oxygenation with 100% oxygen Intubation Type: IV induction Ventilation: Mask ventilation without difficulty Laryngoscope Size: Glidescope (One attempt with Miller 3) Grade View: Grade I Tube size: 7.5 mm Number of attempts: 2 Airway Equipment and Method: Stylet and Video-laryngoscopy Placement Confirmation: ETT inserted through vocal cords under direct vision,  positive ETCO2 and breath sounds checked- equal and bilateral Secured at: 23 cm Tube secured with: Tape Dental Injury: Teeth and Oropharynx as per pre-operative assessment  Difficulty Due To: Difficulty was unanticipated, Difficult Airway- due to anterior larynx and Difficult Airway- due to dentition Future Recommendations: Recommend- induction with short-acting agent, and alternative techniques readily available

## 2015-05-09 ENCOUNTER — Encounter (HOSPITAL_COMMUNITY): Payer: Self-pay | Admitting: Orthopedic Surgery

## 2015-05-09 LAB — GLUCOSE, CAPILLARY
GLUCOSE-CAPILLARY: 197 mg/dL — AB (ref 65–99)
GLUCOSE-CAPILLARY: 146 mg/dL — AB (ref 65–99)
Glucose-Capillary: 183 mg/dL — ABNORMAL HIGH (ref 65–99)

## 2015-05-09 MED ORDER — POLYETHYLENE GLYCOL 3350 17 GM/SCOOP PO POWD: 17.0000 g | Freq: Every day | ORAL | Status: DC

## 2015-05-09 MED ORDER — DOCUSATE SODIUM 100 MG PO CAPS
100.0000 mg | ORAL_CAPSULE | Freq: Three times a day (TID) | ORAL | Status: DC | PRN
Start: 2015-05-09 — End: 2017-04-07

## 2015-05-09 MED ORDER — ONDANSETRON HCL 4 MG PO TABS: 4.0000 mg | ORAL_TABLET | Freq: Three times a day (TID) | ORAL | Status: DC | PRN

## 2015-05-09 MED ORDER — METHOCARBAMOL 500 MG PO TABS: 500.0000 mg | ORAL_TABLET | Freq: Three times a day (TID) | ORAL | Status: DC | PRN

## 2015-05-09 MED ORDER — HYDROCODONE-ACETAMINOPHEN 10-325 MG PO TABS: 1.0000 | ORAL_TABLET | Freq: Four times a day (QID) | ORAL | Status: DC | PRN

## 2015-05-09 MED ORDER — FLEET ENEMA 7-19 GM/118ML RE ENEM
1.0000 | ENEMA | Freq: Once | RECTAL | Status: AC
  Administered 2015-05-09: 1 via RECTAL
  Filled 2015-05-09: qty 1

## 2015-05-09 NOTE — Progress Notes (Signed)
    Subjective: Procedure(s) (LRB): LUMBAR LAMINECTOMY/DECOMPRESSION MICRODISCECTOMY 1 LEVEL Lumbar four five (N/A) 1 Day Post-Op  Patient reports pain as 2 on 0-10 scale.  Reports decreased leg pain reports incisional back pain   Positive void Negative bowel movement Negative flatus Positive chest pain or shortness of breath  Objective: Vital signs in last 24 hours: Temp:  [97.7 F (36.5 C)-98.1 F (36.7 C)] 98.1 F (36.7 C) (06/23 0751) Pulse Rate:  [63-84] 81 (06/23 1224) Resp:  [10-20] 18 (06/23 1224) BP: (110-155)/(52-115) 155/71 mmHg (06/23 1224) SpO2:  [93 %-100 %] 100 % (06/23 1224)  Intake/Output from previous day: 06/22 0701 - 06/23 0700 In: 2051 [P.O.:600; I.V.:1350; IV Piggyback:100] Out: 1100 [Urine:950; Blood:150]  Labs:  Recent Labs  05/08/15 1020  WBC 8.5  RBC 4.83  HCT 42.6  PLT 261    Recent Labs  05/08/15 1020  NA 139  K 4.1  CL 103  CO2 27  BUN 15  CREATININE 1.06  GLUCOSE 164*  CALCIUM 9.9   No results for input(s): LABPT, INR in the last 72 hours.  Physical Exam: Neurologically intact ABD soft Intact pulses distally Incision: dressing C/D/I and no drainage Compartment soft  Assessment/Plan: Patient stable  xrays n/a Continue mobilization with physical therapy Continue care  Advance diet Up with therapy  Plan on d/c to home if positive flatus/BM   Melina Schools, MD [place] [phone]

## 2015-05-09 NOTE — Progress Notes (Signed)
Pt given D/C instructions with Rx's, verbal understanding was provided. Pt's incision is clean and dry with no sign of infection. Pt's IV was removed prior to D/C. Pt D/C'd home via wheelchair @ 1830 per MD order. Pt is stable @ D/C and has no other needs at this time. Holli Humbles, RN

## 2015-05-09 NOTE — Evaluation (Signed)
Physical Therapy Evaluation Patient Details Name: [name] MRN: [id number] DOB: 1935-07-30 Today's Date: 05/09/2015   History of Present Illness  Pt is a 80 y/o male admitted s/p L4-5 lumbar laminectomy/decompression on 05/09/15.  Clinical Impression  Pt admitted with above diagnosis. Pt currently with functional limitations due to the deficits listed below (see PT Problem List). At the time of PT eval pt was able to perform transfers and ambulation with mod I to supervision for safety. Would prefer that pt have SPC and/or supervision for ambulation initially for safety. Pt will benefit from skilled PT to increase their independence and safety with mobility to allow discharge to the venue listed below.       Follow Up Recommendations Outpatient PT;Supervision for mobility/OOB (When appropriate per post-op protocol. )    Equipment Recommendations  None recommended by PT    Recommendations for Other Services       Precautions / Restrictions Precautions Precautions: Back Precaution Booklet Issued: Yes (comment) Precaution Comments: Reviewed back precautions. Pt able to recall 2/3 without cueing.  Required Braces or Orthoses: Spinal Brace Spinal Brace: Lumbar corset Restrictions Weight Bearing Restrictions: No (Simultaneous filing. User may not have seen previous data.)      Mobility  Bed Mobility               General bed mobility comments: Pt up in chair. Reinforced education on log rolling techniques.  Transfers Overall transfer level: Modified independent Equipment used: None             General transfer comment: No physical assist required and no unsteadiness noted.  Ambulation/Gait Ambulation/Gait assistance: Supervision Ambulation Distance (Feet): 250 Feet Assistive device: None Gait Pattern/deviations: Step-through pattern;Decreased stride length;Trunk flexed Gait velocity: Decreased Gait velocity interpretation: Below normal speed for  age/gender General Gait Details: Pt holding on to railing in hall on the way down the hall, and without UE support on the way back to the room. Pt appears guarded and is generally walking slow, however no physical assistance is required.   Stairs            Wheelchair Mobility    Modified Rankin (Stroke Patients Only)       Balance Overall balance assessment: No apparent balance deficits (not formally assessed)                                           Pertinent Vitals/Pain Pain Assessment: 0-10 Pain Score: 5  Pain Location: Incisional pain while walking.  Pain Descriptors / Indicators: Operative site guarding Pain Intervention(s): Limited activity within patient's tolerance;Monitored during session;Repositioned    Home Living Family/patient expects to be discharged to:: Private residence Living Arrangements: Spouse/significant other Available Help at Discharge: Available 24 hours/day Type of Home: House Home Access: Stairs to enter   [employer] of Steps: 1 Home Layout: One level Home Equipment: Environmental consultant - 2 wheels;Cane - single point;Bedside commode;Shower seat;Grab bars - toilet;Grab bars - tub/shower;Hand held shower head;Wheelchair - manual      Prior Function Level of Independence: Independent               Hand Dominance   Dominant Hand: Right    Extremity/Trunk Assessment   Upper Extremity Assessment: Defer to OT evaluation           Lower Extremity Assessment: Generalized weakness      Cervical / Trunk Assessment:  Normal  Communication   Communication: No difficulties  Cognition Arousal/Alertness: Awake/alert Behavior During Therapy: WFL for tasks assessed/performed Overall Cognitive Status: Within Functional Limits for tasks assessed                      General Comments      Exercises        Assessment/Plan    PT Assessment Patient needs continued PT services  PT Diagnosis Difficulty  walking;Generalized weakness   PT Problem List Decreased strength;Decreased range of motion;Decreased activity tolerance;Decreased balance;Decreased mobility;Decreased knowledge of use of DME;Decreased knowledge of precautions;Decreased safety awareness;Pain  PT Treatment Interventions DME instruction;Gait training;Stair training;Functional mobility training;Therapeutic activities;Therapeutic exercise;Neuromuscular re-education;Patient/family education   PT Goals (Current goals can be found in the Care Plan section) Acute Rehab PT Goals Patient Stated Goal: to go home PT Goal Formulation: With patient Time For Goal Achievement: 05/16/15 Potential to Achieve Goals: Good    Frequency Min 5X/week   Barriers to discharge        Co-evaluation               End of Session Equipment Utilized During Treatment: Back brace Activity Tolerance: Patient tolerated treatment well Patient left: in chair;with call bell/phone within reach Nurse Communication: Mobility status         Time: 9774-1423 PT Time Calculation (min) (ACUTE ONLY): 13 min   Charges:   PT Evaluation $Initial PT Evaluation Tier I: 1 Procedure     PT G CodesRolinda Roan 2015-05-30, 9:58 AM   Rolinda Roan, PT, DPT Acute Rehabilitation Services Pager: [phone]

## 2015-05-09 NOTE — Op Note (Signed)
[name], [name]           ACCOUNT NO.:  [id number]  MEDICAL RECORD NO.:  [id number]  LOCATION:  3C07C                        FACILITY:  [facility]  PHYSICIAN:  Juniel Groene D. Rolena Infante, M.D. DATE OF BIRTH:  [birth year]  DATE OF PROCEDURE:  05/08/2015 DATE OF DISCHARGE:                              OPERATIVE REPORT   PREOPERATIVE DIAGNOSIS:  Lumbar spinal stenosis, L4-5.  POSTOPERATIVE DIAGNOSIS:  Lumbar spinal stenosis, L4-5.  OPERATIVE PROCEDURE:  Lumbar decompression, L4-5.  COMPLICATIONS:  None.  CONDITION:  Stable.  INTRAOPERATIVE FINDINGS:  No significant disk herniation was noted. Severe thickening of the ligamentum flavum causing marked compression of the thecal sac.  HISTORY:  This is a very pleasant 80 year old gentleman who has been having severe back, buttock, and bilateral leg pain, left side more significant than right.  Leg pain consistent with neurogenic claudication.  Attempts at conservative management failed to alleviate his symptoms, so we elected to proceed with surgery.  All appropriate risks, benefits, and alternatives were discussed with the patient and consent was obtained.  OPERATIVE NOTE:  The patient was brought to the operating room, placed supine on the operating table.  After successful induction of general anesthesia and endotracheal intubation, TEDs, SCDs, and Foley were inserted.  The patient was turned prone onto the Wilson frame.  All bony prominences were well padded and the back was prepped and draped in a standard fashion.  Time-out was taken confirming patient, procedure, and all other pertinent important data.  Once this was done, the 2 needles were placed in the back and x-ray was taken to confirm incision site. The patient had a previous L5-S1 microdiscectomy and so I elected to make an incision starting at the level above this and re-incising his previous incision for retraction purposes.  Sharp dissection was carried out down to the  deep fascia.  Deep fascia was sharply incised and I stripped the paraspinal muscles to expose the L4 spinous process as well as the L5 spinous process.  Care was taken not to go overly aggressive with the approach because of the previous laminotomy site.  Once I had the L4-5 level exposed, I placed a Penfield 4 underneath the lamina of L4 and took an intraoperative x-ray confirming I was at the appropriate level.  I then used a double-action Leksell rongeur to remove the L4 spinous process and the superior portion of the L5 spinous process. There was marked thickening of the ligamentum flavum and so I debrided this with a pituitary rongeur.  Once the majority of it was debrided, right I now had more near normal anatomy.  I used a 3-0 Karlin curette to develop a plane underneath the L4 lamina.  I then used my 3 and 2 mm Kerrison to perform a very generous laminotomy of L2.  I also resected the medial portion of the inferior facet of L4.  Once this was done bilaterally, I then used Penfield 4 to dissect through the central raphe of the ligamentum flavum and create a plane between the thecal sac and the ligamentum flavum.  I used my 3-mm Kerrison punch to remove all the ligamentum flavum in the central region.  I then worked my way into the lateral recess  removing osteophyte and the medial osteophyte from the superior L5 facet complex.  This allowed me to decompress the lateral recess.  I then identified the L5 pedicle and made sure I decompressed out to the medial border.  I then traced the L5 nerve root into the foramen and performed a foraminotomy at this level as well.  I then trimmed down the leading edge of the L5 spinous process to completely decompress the area.  I then evaluated the disk space itself.  There was no significant herniation and neural compression.  As such, I elected not to further compromise the area by performing a diskectomy.  At this point, I could now take my  [facility], passed it superiorly, although elevated at the level of the L4 pedicle inferiorly __________ and the L5 pedicle and into the foramina of 5, and I could pass it underneath the thecal sac circumferentially.  Once I was able to do this bilaterally, I knew my decompression was adequate.  I then noted that after irrigating the thecal sac, it became thinned where the pressure was maximum, although there was no CSF leak.  I did put the Neuragen patch over that area just to strengthen it, so I would not run into a leak postoperatively.  Hemostasis was obtained using bipolar electrocautery and FloSeal.  I irrigated the wound copiously with normal saline again to make sure there was no CSF leak and I had adequate decompression.  I removed the retractors and then closed the wound in a layered fashion.  This was accomplished with #1 Vicryl sutures for the deep fascia, 2-0 Vicryl suture for superficial, and 3-0 Monocryl for the skin.  Steri-Strips and dry dressing were applied.  First assistant was [name] __________, my PA.     [name] D. Rolena Infante, M.D.     DDB/MEDQ  D:  05/08/2015  T:  05/09/2015  Job:  [id number]

## 2015-05-09 NOTE — Progress Notes (Signed)
Occupational Therapy Evaluation Patient Details Name: [name] MRN: [id number] DOB: [birth year] Today's Date: 05/09/2015    History of Present Illness LUMBAR LAMINECTOMY/DECOMPRESSION MICRODISCECTOMY 1 LEVEL Lumbar four five (N/A)   Clinical Impression   Completed all education regarding back precautions and ADL with use of DME and AE. Pt ready to D/C home with intermittent S. OT signing off.     Follow Up Recommendations  No OT follow up;Supervision - Intermittent    Equipment Recommendations  None recommended by OT    Recommendations for Other Services       Precautions / Restrictions Precautions Precautions: Back Precaution Booklet Issued: Yes (comment) Required Braces or Orthoses: Spinal Brace Spinal Brace: Lumbar corset Restrictions Weight Bearing Restrictions: No      Mobility Bed Mobility               General bed mobility comments: Pt up in chair. Educated on log rolling techniques.  Transfers Overall transfer level: Modified independent                    Balance Overall balance assessment: No apparent balance deficits (not formally assessed)                                          ADL Overall ADL's : Needs assistance/impaired                                     Functional mobility during ADLs: Modified independent General ADL Comments: Reviewed compensatory techniques for ADL regarding back precautions. Pt plans to hae wife assist as needed. Has all DME. Recommended pt use showerseat initially to reduce risk of falls. Discussed home safety and reducing risk of falls.      Vision     Perception     Praxis      Pertinent Vitals/Pain Pain Assessment: 0-10 Pain Score: 3  Pain Location: back Pain Descriptors / Indicators: Aching Pain Intervention(s): Limited activity within patient's tolerance;Monitored during session     Hand Dominance Right   Extremity/Trunk Assessment Upper  Extremity Assessment Upper Extremity Assessment: Overall WFL for tasks assessed   Lower Extremity Assessment Lower Extremity Assessment: Defer to PT evaluation   Cervical / Trunk Assessment Cervical / Trunk Assessment: Normal   Communication Communication Communication: No difficulties   Cognition Arousal/Alertness: Awake/alert Behavior During Therapy: WFL for tasks assessed/performed Overall Cognitive Status: Within Functional Limits for tasks assessed                     General Comments       Exercises       Shoulder Instructions      Home Living Family/patient expects to be discharged to:: Private residence Living Arrangements: Spouse/significant other Available Help at Discharge: Available 24 hours/day Type of Home: House Home Access: Stairs to enter [employer] of Steps: 1   Home Layout: One level     Bathroom Shower/Tub: Tub/shower unit Shower/tub characteristics: Architectural technologist: Standard Bathroom Accessibility: Yes How Accessible: Accessible via walker Home Equipment: [place] - 2 wheels;Cane - single point;Bedside commode;Shower seat;Grab bars - toilet;Grab bars - tub/shower;Hand held shower head;Wheelchair - manual          Prior Functioning/Environment Level of Independence: Independent  OT Diagnosis: Generalized weakness;Acute pain   OT Problem List: Decreased strength;Decreased activity tolerance;Pain;Decreased knowledge of use of DME or AE;Decreased knowledge of precautions   OT Treatment/Interventions:      OT Goals(Current goals can be found in the care plan section) Acute Rehab OT Goals Patient Stated Goal: to go ome OT Goal Formulation: All assessment and education complete, DC therapy  OT Frequency:     Barriers to D/C:            Co-evaluation              End of Session Equipment Utilized During Treatment: Back brace Nurse Communication: Mobility status  Activity Tolerance:  Patient tolerated treatment well Patient left: in chair;with call bell/phone within reach   Time: 0845-0900 OT Time Calculation (min): 15 min Charges:  OT General Charges $OT Visit: 1 Procedure OT Evaluation $Initial OT Evaluation Tier I: 1 Procedure G-Codes:    Casara Perrier,HILLARY 05-21-15, 9:41 AM   Maurie Boettcher, OTR/L  [phone] 2015/05/21

## 2015-05-09 NOTE — Discharge Summary (Signed)
Patient ID: [name] MRN: [id number] DOB/AGE: 05-31-1935 80 y.o.  Admit date: 05/08/2015 Discharge date: 05/09/2015  Admission Diagnoses:  Active Problems:   Back pain   Discharge Diagnoses:  Active Problems:   Back pain  status post Procedure(s): LUMBAR LAMINECTOMY/DECOMPRESSION MICRODISCECTOMY 1 LEVEL Lumbar four five  Past Medical History  Diagnosis Date  . CARCINOMA, SKIN, SQUAMOUS CELL 01/07/2010  . COLONIC POLYPS, HX OF 04/26/2007  . DIABETES MELLITUS, TYPE II 04/26/2007  . HYPERTENSION 04/26/2007  . NEPHROLITHIASIS, HX OF 04/26/2007  . Unspecified hearing loss 12/18/2008  . Coronary artery disease     s/p CABG 3/12: L-LAD, S-CFX (Dr. Roxy Manns); EF 45% at cath prior to CABG  . Hyperlipidemia   . Atrial fibrillation     post op; amiodarone and coumadin continued for 3 mos post op  . Arthritis   . Cataract     beginning of cataracts  . Shortness of breath dyspnea     with exertion    Surgeries: Procedure(s): LUMBAR LAMINECTOMY/DECOMPRESSION MICRODISCECTOMY 1 LEVEL Lumbar four five on 05/08/2015   Consultants:    Discharged Condition: Improved  Hospital Course: [name] is an 80 y.o. male who was admitted 05/08/2015 for operative treatment of spinal stenosis. Patient failed conservative treatments (please see the history and physical for the specifics) and had severe unremitting pain that affects sleep, daily activities and work/hobbies. After pre-op clearance, the patient was taken to the operating room on 05/08/2015 and underwent  Procedure(s): LUMBAR LAMINECTOMY/DECOMPRESSION MICRODISCECTOMY 1 LEVEL Lumbar four five.    Patient was given perioperative antibiotics: Anti-infectives    Start     Dose/Rate Route Frequency Ordered Stop   05/08/15 2100  ceFAZolin (ANCEF) IVPB 1 g/50 mL premix     1 g 100 mL/hr over 30 Minutes Intravenous Every 8 hours 05/08/15 1756 05/09/15 0438   05/08/15 1200  ceFAZolin (ANCEF) IVPB 2 g/50 mL premix     2 g 100  mL/hr over 30 Minutes Intravenous To [facility] 05/07/15 1349 05/08/15 1300       Patient was given sequential compression devices and early ambulation to prevent DVT.   Patient benefited maximally from hospital stay and there were no complications. At the time of discharge, the patient was urinating/moving their bowels without difficulty, tolerating a regular diet, pain is controlled with oral pain medications and they have been cleared by PT/OT.   Recent vital signs: Patient Vitals for the past 24 hrs:  BP Temp Temp src Pulse Resp SpO2  05/09/15 1224 (!) 155/71 mmHg - - 81 18 100 %  05/09/15 0751 136/74 mmHg 98.1 F (36.7 C) - 83 18 96 %  05/09/15 0400 114/62 mmHg 97.8 F (36.6 C) Oral 82 20 97 %  05/08/15 2357 (!) 137/52 mmHg 97.9 F (36.6 C) Oral 84 18 94 %  05/08/15 2000 (!) 110/56 mmHg 97.7 F (36.5 C) Oral 79 20 93 %  05/08/15 1744 (!) 154/74 mmHg 97.8 F (36.6 C) - 70 20 97 %  05/08/15 1715 - - - 67 - 97 %  05/08/15 1700 - 97.9 F (36.6 C) - 74 - 96 %  05/08/15 1645 - - - 83 - 98 %  05/08/15 1630 125/64 mmHg - - 67 - 97 %  05/08/15 1615 (!) 130/59 mmHg - - 69 - 93 %  05/08/15 1600 138/61 mmHg - - 68 - 94 %  05/08/15 1545 (!) 139/57 mmHg - - 63 19 94 %  05/08/15 1530 135/63  mmHg - - 66 10 93 %  2015/06/03 1515 (!) 148/64 mmHg - - 72 16 95 %     Recent laboratory studies:  Recent Labs  06/03/2015 1020  WBC 8.5  HGB 15.0  HCT 42.6  PLT 261  NA 139  K 4.1  CL 103  CO2 27  BUN 15  CREATININE 1.06  GLUCOSE 164*  CALCIUM 9.9     Discharge Medications:     Medication List    STOP taking these medications        aspirin 325 MG tablet     ibuprofen 200 MG tablet  Commonly known as:  ADVIL,MOTRIN      TAKE these medications        amLODipine 5 MG tablet  Commonly known as:  NORVASC  Take 1 tablet (5 mg total) by mouth daily.     cholecalciferol 1000 UNITS tablet  Commonly known as:  VITAMIN D  Take 1,000 Units by mouth 2 (two) times daily.       docusate sodium 100 MG capsule  Commonly known as:  COLACE  Take 1 capsule (100 mg total) by mouth 3 (three) times daily as needed for mild constipation.     HYDROcodone-acetaminophen 10-325 MG per tablet  Commonly known as:  NORCO  Take 1 tablet by mouth every 6 (six) hours as needed.     metFORMIN 500 MG tablet  Commonly known as:  GLUCOPHAGE  Take 1 tablet (500 mg total) by mouth 3 (three) times daily.     methocarbamol 500 MG tablet  Commonly known as:  ROBAXIN  Take 1 tablet (500 mg total) by mouth 3 (three) times daily as needed for muscle spasms.     metoprolol tartrate 25 MG tablet  Commonly known as:  LOPRESSOR  Take 1 tablet (25 mg total) by mouth daily.     ondansetron 4 MG tablet  Commonly known as:  ZOFRAN  Take 1 tablet (4 mg total) by mouth every 8 (eight) hours as needed for nausea or vomiting.     polyethylene glycol powder powder  Commonly known as:  GLYCOLAX  Take 17 g by mouth daily.     simvastatin 20 MG tablet  Commonly known as:  ZOCOR  TAKE 1/2 TABLET BY MOUTH EVERY DAY AT BEDTIME     sodium chloride 0.65 % Soln nasal spray  Commonly known as:  OCEAN  Place 1 spray into both nostrils as needed for congestion.        Diagnostic Studies: Dg Lumbar Spine 1 View  Jun 03, 2015   CLINICAL DATA:  L4-5 laminectomy  EXAM: LUMBAR SPINE - 1 VIEW  COMPARISON:  None  FINDINGS: Intraoperative localization film demonstrates needles adjacent to the posterior elements of L4 as well as posterior to the L5-S1 disc space.  IMPRESSION: Intraoperative localization at L4 and L5-S1   Electronically Signed   By: Inez Catalina M.D.   On: 2015-06-03 16:40   Dg Spine Portable 1 View  2015-06-03   CLINICAL DATA:  Subsequent encounter for lumbar spine surgery L4-5  EXAM: PORTABLE SPINE - 1 VIEW  COMPARISON:  None.  FINDINGS: Caudal most lumbar type vertebral body is labeled L5. This places the last full lobe and disc space at L5-S1. Soft tissue retractors are seen posteriorly in  the lower back, in the region the L5 spinous process. Surgical probe tip overlies the L4-5 facet space.  IMPRESSION: Intraoperative localization with surgical probe tip overlying the L4-5 facets.   Electronically  Signed   By: Misty Stanley M.D.   On: 05/08/2015 13:46          Follow-up Information    Follow up with Melina Schools D, MD. Schedule an appointment as soon as possible for a visit in 2 weeks.   Specialty:  Orthopedic Surgery   Why:  If symptoms worsen, For suture removal, For wound re-check   Contact information:   [address] [place] [address] [phone]       Discharge Plan:  discharge to home   Disposition: hospital course uneventful.  Ambulating without radicular arm pain.  Only complaint is constipation.  Will d/c to home after flatus or BM with enema.  F/u in 2 weeks    Signed: Melina Schools D for Dr. Melina Schools [facility] [phone] 05/09/2015, 3:11 PM

## 2015-05-09 NOTE — Care Management (Signed)
Utilization review completed by Iridessa Harrow N. Antwoine Zorn, RN BSN 

## 2015-05-10 ENCOUNTER — Telehealth: Payer: Self-pay

## 2015-05-10 NOTE — Telephone Encounter (Signed)
ON TCM list. Pt to follow up with neurosurgeon. Surgery for  Procedure [Code]   LUMBAR LAMINECTOMY/DECOMPRESSION MICRODISCECTOMY 1 LEVEL Lumbar four five (N/A Spine Lumbar)       Diagnosis [Codes]   lumbar spinal stenosis

## 2015-05-21 ENCOUNTER — Encounter: Payer: Self-pay | Admitting: Internal Medicine

## 2015-05-21 ENCOUNTER — Ambulatory Visit (INDEPENDENT_AMBULATORY_CARE_PROVIDER_SITE_OTHER): Payer: Medicare Other | Admitting: Internal Medicine

## 2015-05-21 VITALS — BP 122/68 | HR 69 | Wt 228.0 lb

## 2015-05-21 DIAGNOSIS — I2510 Atherosclerotic heart disease of native coronary artery without angina pectoris: Secondary | ICD-10-CM

## 2015-05-21 DIAGNOSIS — E785 Hyperlipidemia, unspecified: Secondary | ICD-10-CM | POA: Diagnosis not present

## 2015-05-21 DIAGNOSIS — I10 Essential (primary) hypertension: Secondary | ICD-10-CM | POA: Diagnosis not present

## 2015-05-21 DIAGNOSIS — E1159 Type 2 diabetes mellitus with other circulatory complications: Secondary | ICD-10-CM | POA: Diagnosis not present

## 2015-05-21 NOTE — Assessment & Plan Note (Signed)
On ASA, Metoprolol, Simvastatin and Norvasc

## 2015-05-21 NOTE — Progress Notes (Signed)
Pre visit review using our clinic review tool, if applicable. No additional management support is needed unless otherwise documented below in the visit note. 

## 2015-05-21 NOTE — Assessment & Plan Note (Signed)
BMET, A1c q 3 mo

## 2015-05-21 NOTE — Assessment & Plan Note (Signed)
On Simvastatin 

## 2015-05-21 NOTE — Progress Notes (Signed)
Subjective:  Patient ID: [name], male    DOB: 02-26-1935  Age: 80 y.o. MRN: [id number]  CC: No chief complaint on file.   HPI [name] presents for DM, HTN, OA. His recent surgery went well...  Outpatient Prescriptions Prior to Visit  Medication Sig Dispense Refill  . amLODipine (NORVASC) 5 MG tablet Take 1 tablet (5 mg total) by mouth daily. 30 tablet 10  . cholecalciferol (VITAMIN D) 1000 UNITS tablet Take 1,000 Units by mouth 2 (two) times daily.     Marland Kitchen docusate sodium (COLACE) 100 MG capsule Take 1 capsule (100 mg total) by mouth 3 (three) times daily as needed for mild constipation. 30 capsule 0  . HYDROcodone-acetaminophen (NORCO) 10-325 MG per tablet Take 1 tablet by mouth every 6 (six) hours as needed. 60 tablet 0  . metFORMIN (GLUCOPHAGE) 500 MG tablet Take 1 tablet (500 mg total) by mouth 3 (three) times daily. 90 tablet 11  . methocarbamol (ROBAXIN) 500 MG tablet Take 1 tablet (500 mg total) by mouth 3 (three) times daily as needed for muscle spasms. 60 tablet 0  . metoprolol tartrate (LOPRESSOR) 25 MG tablet Take 1 tablet (25 mg total) by mouth daily. 30 tablet 10  . ondansetron (ZOFRAN) 4 MG tablet Take 1 tablet (4 mg total) by mouth every 8 (eight) hours as needed for nausea or vomiting. 20 tablet 0  . polyethylene glycol powder (GLYCOLAX) powder Take 17 g by mouth daily. 255 g 1  . simvastatin (ZOCOR) 20 MG tablet TAKE 1/2 TABLET BY MOUTH EVERY DAY AT BEDTIME 45 tablet 0  . sodium chloride (OCEAN) 0.65 % SOLN nasal spray Place 1 spray into both nostrils as needed for congestion.     No facility-administered medications prior to visit.    ROS Review of Systems  Constitutional: Positive for fatigue. Negative for appetite change and unexpected weight change.  HENT: Negative for congestion, nosebleeds, sneezing, sore throat and trouble swallowing.   Eyes: Negative for itching and visual disturbance.  Respiratory: Negative for cough and chest  tightness.   Cardiovascular: Negative for chest pain, palpitations and leg swelling.  Gastrointestinal: Negative for nausea, diarrhea, blood in stool and abdominal distention.  Genitourinary: Negative for frequency and hematuria.  Musculoskeletal: Negative for back pain, joint swelling, gait problem and neck pain.  Skin: Negative for rash.  Neurological: Negative for dizziness, tremors, speech difficulty and weakness.  Psychiatric/Behavioral: Negative for suicidal ideas, confusion, sleep disturbance, dysphoric mood and agitation. The patient is not nervous/anxious.     Objective:  BP 122/68 mmHg  Pulse 69  Wt 228 lb (103.42 kg)  SpO2 96%  BP Readings from Last 3 Encounters:  05/21/15 122/68  05/09/15 136/51  01/17/15 130/70    Wt Readings from Last 3 Encounters:  05/21/15 228 lb (103.42 kg)  05/08/15 220 lb (99.791 kg)  01/17/15 228 lb 1.9 oz (103.475 kg)    Physical Exam  Constitutional: He is oriented to person, place, and time. He appears well-developed. No distress.  NAD Obese  HENT:  Mouth/Throat: Oropharynx is clear and moist.  Eyes: Conjunctivae are normal. Pupils are equal, round, and reactive to light.  Neck: Normal range of motion. No JVD present. No thyromegaly present.  Cardiovascular: Normal rate, regular rhythm, normal heart sounds and intact distal pulses.  Exam reveals no gallop and no friction rub.   No murmur heard. Pulmonary/Chest: Effort normal and breath sounds normal. No respiratory distress. He has no wheezes. He has no rales. He  exhibits no tenderness.  Abdominal: Soft. Bowel sounds are normal. He exhibits no distension and no mass. There is no tenderness. There is no rebound and no guarding.  Musculoskeletal: Normal range of motion. He exhibits no edema or tenderness.  Lymphadenopathy:    He has no cervical adenopathy.  Neurological: He is alert and oriented to person, place, and time. He has normal reflexes. No cranial nerve deficit. He exhibits  normal muscle tone. He displays a negative Romberg sign. Coordination and gait normal.  Skin: Skin is warm and dry. No rash noted.  Psychiatric: He has a normal mood and affect. His behavior is normal. Judgment and thought content normal.    Lab Results  Component Value Date   WBC 8.5 05/08/2015   HGB 15.0 05/08/2015   HCT 42.6 05/08/2015   PLT 261 05/08/2015   GLUCOSE 164* 05/08/2015   CHOL 95 04/11/2014   TRIG 78.0 04/11/2014   HDL 38.30* 04/11/2014   LDLCALC 41 04/11/2014   ALT 24 04/11/2014   AST 24 04/11/2014   NA 139 05/08/2015   K 4.1 05/08/2015   CL 103 05/08/2015   CREATININE 1.06 05/08/2015   BUN 15 05/08/2015   CO2 27 05/08/2015   TSH 1.41 01/03/2013   PSA 1.55 12/26/2010   INR 2.3 03/03/2012   HGBA1C 7.3* 01/16/2015    Dg Lumbar Spine 1 View  05/08/2015   CLINICAL DATA:  L4-5 laminectomy  EXAM: LUMBAR SPINE - 1 VIEW  COMPARISON:  None  FINDINGS: Intraoperative localization film demonstrates needles adjacent to the posterior elements of L4 as well as posterior to the L5-S1 disc space.  IMPRESSION: Intraoperative localization at L4 and L5-S1   Electronically Signed   By: Inez Catalina M.D.   On: 05/08/2015 16:40   Dg Spine Portable 1 View  05/08/2015   CLINICAL DATA:  Subsequent encounter for lumbar spine surgery L4-5  EXAM: PORTABLE SPINE - 1 VIEW  COMPARISON:  None.  FINDINGS: Caudal most lumbar type vertebral body is labeled L5. This places the last full lobe and disc space at L5-S1. Soft tissue retractors are seen posteriorly in the lower back, in the region the L5 spinous process. Surgical probe tip overlies the L4-5 facet space.  IMPRESSION: Intraoperative localization with surgical probe tip overlying the L4-5 facets.   Electronically Signed   By: Misty Stanley M.D.   On: 05/08/2015 13:46    Assessment & Plan:   There are no diagnoses linked to this encounter. I am having [name] maintain his metFORMIN, cholecalciferol, metoprolol tartrate, simvastatin,  amLODipine, sodium chloride, ondansetron, methocarbamol, polyethylene glycol powder, docusate sodium, and HYDROcodone-acetaminophen.  No orders of the defined types were placed in this encounter.     Follow-up: No Follow-up on file.  Walker Kehr, MD

## 2015-06-08 ENCOUNTER — Other Ambulatory Visit: Payer: Self-pay | Admitting: Internal Medicine

## 2015-06-10 ENCOUNTER — Other Ambulatory Visit: Payer: Self-pay

## 2015-06-10 MED ORDER — SIMVASTATIN 20 MG PO TABS: 20.0000 mg | ORAL_TABLET | Freq: Every day | ORAL | Status: DC

## 2015-06-11 ENCOUNTER — Other Ambulatory Visit: Payer: Self-pay | Admitting: Internal Medicine

## 2015-06-12 ENCOUNTER — Other Ambulatory Visit: Payer: Self-pay

## 2015-06-12 MED ORDER — SIMVASTATIN 20 MG PO TABS: 20.0000 mg | ORAL_TABLET | Freq: Every day | ORAL | Status: DC

## 2015-08-19 ENCOUNTER — Other Ambulatory Visit (INDEPENDENT_AMBULATORY_CARE_PROVIDER_SITE_OTHER): Payer: Medicare Other

## 2015-08-19 DIAGNOSIS — E1159 Type 2 diabetes mellitus with other circulatory complications: Secondary | ICD-10-CM | POA: Diagnosis not present

## 2015-08-19 LAB — BASIC METABOLIC PANEL
BUN: 15 mg/dL (ref 6–23)
CALCIUM: 9.1 mg/dL (ref 8.4–10.5)
CO2: 27 meq/L (ref 19–32)
Chloride: 106 mEq/L (ref 96–112)
Creatinine, Ser: 0.94 mg/dL (ref 0.40–1.50)
GFR: 82.37 mL/min (ref >60.00)
GLUCOSE: 159 mg/dL — AB (ref 70–99)
Potassium: 3.8 mEq/L (ref 3.5–5.1)
SODIUM: 141 meq/L (ref 135–145)

## 2015-08-19 LAB — HEMOGLOBIN A1C: Hgb A1c MFr Bld: 6.9 % — ABNORMAL HIGH (ref 4.6–6.5)

## 2015-08-20 ENCOUNTER — Encounter: Payer: Self-pay | Admitting: Internal Medicine

## 2015-08-20 ENCOUNTER — Ambulatory Visit (INDEPENDENT_AMBULATORY_CARE_PROVIDER_SITE_OTHER): Payer: Medicare Other | Admitting: Internal Medicine

## 2015-08-20 VITALS — BP 122/64 | HR 72 | Wt 228.0 lb

## 2015-08-20 DIAGNOSIS — I10 Essential (primary) hypertension: Secondary | ICD-10-CM

## 2015-08-20 DIAGNOSIS — E1159 Type 2 diabetes mellitus with other circulatory complications: Secondary | ICD-10-CM | POA: Diagnosis not present

## 2015-08-20 DIAGNOSIS — Z23 Encounter for immunization: Secondary | ICD-10-CM | POA: Diagnosis not present

## 2015-08-20 DIAGNOSIS — E785 Hyperlipidemia, unspecified: Secondary | ICD-10-CM

## 2015-08-20 DIAGNOSIS — I2510 Atherosclerotic heart disease of native coronary artery without angina pectoris: Secondary | ICD-10-CM | POA: Diagnosis not present

## 2015-08-20 NOTE — Assessment & Plan Note (Signed)
On ASA, Metoprolol, Simvastatin and Norvasc 

## 2015-08-20 NOTE — Assessment & Plan Note (Signed)
On Metformin - better  BMET, A1c q 3 mo

## 2015-08-20 NOTE — Progress Notes (Signed)
Subjective:  Patient ID: [name], male    DOB: 24-Jun-1935  Age: 80 y.o. MRN: [id number]  CC: No chief complaint on file.   HPI [name] presents for DM, HTN, OA f/up  Outpatient Prescriptions Prior to Visit  Medication Sig Dispense Refill  . amLODipine (NORVASC) 5 MG tablet Take 1 tablet (5 mg total) by mouth daily. 30 tablet 10  . cholecalciferol (VITAMIN D) 1000 UNITS tablet Take 1,000 Units by mouth 2 (two) times daily.     Marland Kitchen docusate sodium (COLACE) 100 MG capsule Take 1 capsule (100 mg total) by mouth 3 (three) times daily as needed for mild constipation. 30 capsule 0  . metFORMIN (GLUCOPHAGE) 500 MG tablet Take 1 tablet (500 mg total) by mouth 3 (three) times daily. 90 tablet 11  . metoprolol tartrate (LOPRESSOR) 25 MG tablet Take 1 tablet (25 mg total) by mouth daily. 30 tablet 10  . ondansetron (ZOFRAN) 4 MG tablet Take 1 tablet (4 mg total) by mouth every 8 (eight) hours as needed for nausea or vomiting. 20 tablet 0  . polyethylene glycol powder (GLYCOLAX) powder Take 17 g by mouth daily. 255 g 1  . simvastatin (ZOCOR) 20 MG tablet Take 1 tablet (20 mg total) by mouth daily at 6 PM. 30 tablet 6  . sodium chloride (OCEAN) 0.65 % SOLN nasal spray Place 1 spray into both nostrils as needed for congestion.    Marland Kitchen HYDROcodone-acetaminophen (NORCO) 10-325 MG per tablet Take 1 tablet by mouth every 6 (six) hours as needed. (Patient not taking: Reported on 08/20/2015) 60 tablet 0  . methocarbamol (ROBAXIN) 500 MG tablet Take 1 tablet (500 mg total) by mouth 3 (three) times daily as needed for muscle spasms. (Patient not taking: Reported on 08/20/2015) 60 tablet 0   No facility-administered medications prior to visit.    ROS Review of Systems  Constitutional: Negative for appetite change, fatigue and unexpected weight change.  HENT: Negative for congestion, nosebleeds, sneezing, sore throat and trouble swallowing.   Eyes: Negative for itching and visual disturbance.   Respiratory: Negative for cough.   Cardiovascular: Negative for chest pain, palpitations and leg swelling.  Gastrointestinal: Negative for nausea, diarrhea, blood in stool and abdominal distention.  Genitourinary: Negative for frequency and hematuria.  Musculoskeletal: Positive for back pain. Negative for joint swelling, gait problem and neck pain.  Skin: Negative for rash.  Neurological: Negative for dizziness, tremors, speech difficulty and weakness.  Psychiatric/Behavioral: Negative for sleep disturbance, dysphoric mood and agitation. The patient is not nervous/anxious.     Objective:  BP 122/64 mmHg  Pulse 72  Wt 228 lb (103.42 kg)  SpO2 94%  BP Readings from Last 3 Encounters:  08/20/15 122/64  05/21/15 122/68  05/09/15 136/51    Wt Readings from Last 3 Encounters:  08/20/15 228 lb (103.42 kg)  05/21/15 228 lb (103.42 kg)  05/08/15 220 lb (99.791 kg)    Physical Exam  Constitutional: He is oriented to person, place, and time. He appears well-developed. No distress.  NAD  HENT:  Mouth/Throat: Oropharynx is clear and moist.  Eyes: Conjunctivae are normal. Pupils are equal, round, and reactive to light.  Neck: Normal range of motion. No JVD present. No thyromegaly present.  Cardiovascular: Normal rate, regular rhythm, normal heart sounds and intact distal pulses.  Exam reveals no gallop and no friction rub.   No murmur heard. Pulmonary/Chest: Effort normal and breath sounds normal. No respiratory distress. He has no wheezes. He has no  rales. He exhibits no tenderness.  Abdominal: Soft. Bowel sounds are normal. He exhibits no distension and no mass. There is no tenderness. There is no rebound and no guarding.  Musculoskeletal: Normal range of motion. He exhibits no edema or tenderness.  Lymphadenopathy:    He has no cervical adenopathy.  Neurological: He is alert and oriented to person, place, and time. He has normal reflexes. No cranial nerve deficit. He exhibits normal  muscle tone. He displays a negative Romberg sign. Coordination and gait normal.  Skin: Skin is warm and dry. No rash noted.  Psychiatric: He has a normal mood and affect. His behavior is normal. Judgment and thought content normal.  Obese  Lab Results  Component Value Date   WBC 8.5 05/08/2015   HGB 15.0 05/08/2015   HCT 42.6 05/08/2015   PLT 261 05/08/2015   GLUCOSE 159* 08/19/2015   CHOL 95 04/11/2014   TRIG 78.0 04/11/2014   HDL 38.30* 04/11/2014   LDLCALC 41 04/11/2014   ALT 24 04/11/2014   AST 24 04/11/2014   NA 141 08/19/2015   K 3.8 08/19/2015   CL 106 08/19/2015   CREATININE 0.94 08/19/2015   BUN 15 08/19/2015   CO2 27 08/19/2015   TSH 1.41 01/03/2013   PSA 1.55 12/26/2010   INR 2.3 03/03/2012   HGBA1C 6.9* 08/19/2015    Dg Lumbar Spine 1 View  05/08/2015   CLINICAL DATA:  L4-5 laminectomy  EXAM: LUMBAR SPINE - 1 VIEW  COMPARISON:  None  FINDINGS: Intraoperative localization film demonstrates needles adjacent to the posterior elements of L4 as well as posterior to the L5-S1 disc space.  IMPRESSION: Intraoperative localization at L4 and L5-S1   Electronically Signed   By: Inez Catalina M.D.   On: 05/08/2015 16:40   Dg Spine Portable 1 View  05/08/2015   CLINICAL DATA:  Subsequent encounter for lumbar spine surgery L4-5  EXAM: PORTABLE SPINE - 1 VIEW  COMPARISON:  None.  FINDINGS: Caudal most lumbar type vertebral body is labeled L5. This places the last full lobe and disc space at L5-S1. Soft tissue retractors are seen posteriorly in the lower back, in the region the L5 spinous process. Surgical probe tip overlies the L4-5 facet space.  IMPRESSION: Intraoperative localization with surgical probe tip overlying the L4-5 facets.   Electronically Signed   By: Misty Stanley M.D.   On: 05/08/2015 13:46    Assessment & Plan:   Diagnoses and all orders for this visit:  Essential hypertension  Coronary artery disease involving native coronary artery of native heart without  angina pectoris  Type 2 diabetes mellitus with other circulatory complication (HCC)  Dyslipidemia  Need for influenza vaccination -     Flu Vaccine QUAD 36+ mos IM  I am having [name] maintain his metFORMIN, cholecalciferol, metoprolol tartrate, amLODipine, sodium chloride, ondansetron, methocarbamol, polyethylene glycol powder, docusate sodium, HYDROcodone-acetaminophen, and simvastatin.  No orders of the defined types were placed in this encounter.     Follow-up: Return in about 4 months (around 12/21/2015) for a follow-up visit.  Walker Kehr, MD

## 2015-08-20 NOTE — Progress Notes (Signed)
Pre visit review using our clinic review tool, if applicable. No additional management support is needed unless otherwise documented below in the visit note. 

## 2015-08-20 NOTE — Assessment & Plan Note (Signed)
On Simvastatin 

## 2015-09-12 DIAGNOSIS — M7071 Other bursitis of hip, right hip: Secondary | ICD-10-CM | POA: Diagnosis not present

## 2015-09-12 DIAGNOSIS — M4806 Spinal stenosis, lumbar region: Secondary | ICD-10-CM | POA: Diagnosis not present

## 2015-09-12 DIAGNOSIS — M25551 Pain in right hip: Secondary | ICD-10-CM | POA: Diagnosis not present

## 2015-09-24 DIAGNOSIS — Z8679 Personal history of other diseases of the circulatory system: Secondary | ICD-10-CM | POA: Diagnosis not present

## 2015-09-24 DIAGNOSIS — H25013 Cortical age-related cataract, bilateral: Secondary | ICD-10-CM | POA: Diagnosis not present

## 2015-09-24 DIAGNOSIS — H353130 Nonexudative age-related macular degeneration, bilateral, stage unspecified: Secondary | ICD-10-CM | POA: Diagnosis not present

## 2015-09-24 DIAGNOSIS — E119 Type 2 diabetes mellitus without complications: Secondary | ICD-10-CM | POA: Diagnosis not present

## 2015-09-24 DIAGNOSIS — H2513 Age-related nuclear cataract, bilateral: Secondary | ICD-10-CM | POA: Diagnosis not present

## 2015-09-24 LAB — HM DIABETES EYE EXAM

## 2015-10-17 DIAGNOSIS — K56609 Unspecified intestinal obstruction, unspecified as to partial versus complete obstruction: Secondary | ICD-10-CM

## 2015-10-17 HISTORY — DX: Unspecified intestinal obstruction, unspecified as to partial versus complete obstruction: K56.609

## 2015-10-21 DIAGNOSIS — Z9889 Other specified postprocedural states: Secondary | ICD-10-CM | POA: Diagnosis not present

## 2015-10-21 DIAGNOSIS — Z4789 Encounter for other orthopedic aftercare: Secondary | ICD-10-CM | POA: Diagnosis not present

## 2015-11-05 ENCOUNTER — Encounter (HOSPITAL_BASED_OUTPATIENT_CLINIC_OR_DEPARTMENT_OTHER): Payer: Self-pay | Admitting: *Deleted

## 2015-11-05 ENCOUNTER — Inpatient Hospital Stay (HOSPITAL_BASED_OUTPATIENT_CLINIC_OR_DEPARTMENT_OTHER)
Admission: EM | Admit: 2015-11-05 | Discharge: 2015-11-08 | DRG: 390 | Disposition: A | Discharge status: Home / Self Care | Payer: Medicare Other | Attending: Internal Medicine | Admitting: Internal Medicine

## 2015-11-05 DIAGNOSIS — Z96642 Presence of left artificial hip joint: Secondary | ICD-10-CM | POA: Diagnosis not present

## 2015-11-05 DIAGNOSIS — I4891 Unspecified atrial fibrillation: Secondary | ICD-10-CM | POA: Diagnosis present

## 2015-11-05 DIAGNOSIS — H9190 Unspecified hearing loss, unspecified ear: Secondary | ICD-10-CM | POA: Diagnosis not present

## 2015-11-05 DIAGNOSIS — I480 Paroxysmal atrial fibrillation: Secondary | ICD-10-CM | POA: Diagnosis present

## 2015-11-05 DIAGNOSIS — Z79899 Other long term (current) drug therapy: Secondary | ICD-10-CM | POA: Diagnosis not present

## 2015-11-05 DIAGNOSIS — Z87891 Personal history of nicotine dependence: Secondary | ICD-10-CM

## 2015-11-05 DIAGNOSIS — I10 Essential (primary) hypertension: Secondary | ICD-10-CM | POA: Diagnosis not present

## 2015-11-05 DIAGNOSIS — E118 Type 2 diabetes mellitus with unspecified complications: Secondary | ICD-10-CM | POA: Diagnosis not present

## 2015-11-05 DIAGNOSIS — Z951 Presence of aortocoronary bypass graft: Secondary | ICD-10-CM | POA: Diagnosis not present

## 2015-11-05 DIAGNOSIS — K56609 Unspecified intestinal obstruction, unspecified as to partial versus complete obstruction: Secondary | ICD-10-CM

## 2015-11-05 DIAGNOSIS — Z7984 Long term (current) use of oral hypoglycemic drugs: Secondary | ICD-10-CM

## 2015-11-05 DIAGNOSIS — H269 Unspecified cataract: Secondary | ICD-10-CM | POA: Diagnosis present

## 2015-11-05 DIAGNOSIS — I152 Hypertension secondary to endocrine disorders: Secondary | ICD-10-CM | POA: Diagnosis present

## 2015-11-05 DIAGNOSIS — I2510 Atherosclerotic heart disease of native coronary artery without angina pectoris: Secondary | ICD-10-CM | POA: Diagnosis present

## 2015-11-05 DIAGNOSIS — Z85828 Personal history of other malignant neoplasm of skin: Secondary | ICD-10-CM

## 2015-11-05 DIAGNOSIS — R1033 Periumbilical pain: Secondary | ICD-10-CM | POA: Diagnosis not present

## 2015-11-05 DIAGNOSIS — Z7982 Long term (current) use of aspirin: Secondary | ICD-10-CM | POA: Diagnosis not present

## 2015-11-05 DIAGNOSIS — E119 Type 2 diabetes mellitus without complications: Secondary | ICD-10-CM | POA: Diagnosis not present

## 2015-11-05 DIAGNOSIS — K5669 Other intestinal obstruction: Secondary | ICD-10-CM | POA: Diagnosis not present

## 2015-11-05 DIAGNOSIS — E785 Hyperlipidemia, unspecified: Secondary | ICD-10-CM | POA: Diagnosis not present

## 2015-11-05 DIAGNOSIS — K5900 Constipation, unspecified: Secondary | ICD-10-CM | POA: Diagnosis present

## 2015-11-05 DIAGNOSIS — K5660 Unspecified intestinal obstruction: Secondary | ICD-10-CM | POA: Diagnosis not present

## 2015-11-05 DIAGNOSIS — R1013 Epigastric pain: Secondary | ICD-10-CM | POA: Diagnosis not present

## 2015-11-05 DIAGNOSIS — M1990 Unspecified osteoarthritis, unspecified site: Secondary | ICD-10-CM | POA: Diagnosis present

## 2015-11-05 HISTORY — DX: Unspecified intestinal obstruction, unspecified as to partial versus complete obstruction: K56.609

## 2015-11-05 LAB — URINALYSIS, ROUTINE W REFLEX MICROSCOPIC
BILIRUBIN URINE: NEGATIVE
Glucose, UA: NEGATIVE mg/dL
HGB URINE DIPSTICK: NEGATIVE
KETONES UR: 15 mg/dL — AB
Leukocytes, UA: NEGATIVE
NITRITE: NEGATIVE
PROTEIN: NEGATIVE mg/dL
SPECIFIC GRAVITY, URINE: 1.018 (ref 1.005–1.030)
pH: 7 (ref 5.0–8.0)

## 2015-11-05 LAB — CBC WITH DIFFERENTIAL/PLATELET
BASOS PCT: 0 %
Basophils Absolute: 0 10*3/uL (ref 0.0–0.1)
EOS ABS: 0.1 10*3/uL (ref 0.0–0.7)
EOS PCT: 1 %
HCT: 42.9 % (ref 39.0–52.0)
HEMOGLOBIN: 14.4 g/dL (ref 13.0–17.0)
Lymphocytes Relative: 11 %
Lymphs Abs: 1.3 10*3/uL (ref 0.7–4.0)
MCH: 30.9 pg (ref 26.0–34.0)
MCHC: 33.6 g/dL (ref 30.0–36.0)
MCV: 92.1 fL (ref 78.0–100.0)
Monocytes Absolute: 0.7 10*3/uL (ref 0.1–1.0)
Monocytes Relative: 5 %
NEUTROS PCT: 83 %
Neutro Abs: 10.5 10*3/uL — ABNORMAL HIGH (ref 1.7–7.7)
PLATELETS: 246 10*3/uL (ref 150–400)
RBC: 4.66 MIL/uL (ref 4.22–5.81)
RDW: 12.6 % (ref 11.5–15.5)
WBC: 12.6 10*3/uL — AB (ref 4.0–10.5)

## 2015-11-05 LAB — BASIC METABOLIC PANEL
Anion gap: 8 (ref 5–15)
BUN: 18 mg/dL (ref 6–20)
CHLORIDE: 101 mmol/L (ref 101–111)
CO2: 28 mmol/L (ref 22–32)
CREATININE: 0.93 mg/dL (ref 0.61–1.24)
Calcium: 9.2 mg/dL (ref 8.9–10.3)
Glucose, Bld: 195 mg/dL — ABNORMAL HIGH (ref 65–99)
Potassium: 3.9 mmol/L (ref 3.5–5.1)
SODIUM: 137 mmol/L (ref 135–145)

## 2015-11-05 NOTE — ED Notes (Signed)
C/o abd pain mid upper onset this pm  Vomited x 1

## 2015-11-05 NOTE — ED Notes (Signed)
Abdominal pain. Constipation 3 days ago. He vomited before coming here and pain improved but returned. He has been taking a lot of Ibuprofen over the past 2 weeks for rib pain after a fall. He thinks he has an ulcer. No relief with Maalox.

## 2015-11-06 ENCOUNTER — Emergency Department (HOSPITAL_BASED_OUTPATIENT_CLINIC_OR_DEPARTMENT_OTHER): Payer: Medicare Other

## 2015-11-06 ENCOUNTER — Encounter (HOSPITAL_BASED_OUTPATIENT_CLINIC_OR_DEPARTMENT_OTHER): Payer: Self-pay | Admitting: Radiology

## 2015-11-06 ENCOUNTER — Observation Stay (HOSPITAL_COMMUNITY): Payer: Medicare Other

## 2015-11-06 DIAGNOSIS — H269 Unspecified cataract: Secondary | ICD-10-CM | POA: Diagnosis present

## 2015-11-06 DIAGNOSIS — E119 Type 2 diabetes mellitus without complications: Secondary | ICD-10-CM | POA: Diagnosis present

## 2015-11-06 DIAGNOSIS — K5660 Unspecified intestinal obstruction: Secondary | ICD-10-CM | POA: Diagnosis not present

## 2015-11-06 DIAGNOSIS — Z951 Presence of aortocoronary bypass graft: Secondary | ICD-10-CM | POA: Diagnosis not present

## 2015-11-06 DIAGNOSIS — I10 Essential (primary) hypertension: Secondary | ICD-10-CM

## 2015-11-06 DIAGNOSIS — Z7982 Long term (current) use of aspirin: Secondary | ICD-10-CM | POA: Diagnosis not present

## 2015-11-06 DIAGNOSIS — Z85828 Personal history of other malignant neoplasm of skin: Secondary | ICD-10-CM | POA: Diagnosis not present

## 2015-11-06 DIAGNOSIS — R1033 Periumbilical pain: Secondary | ICD-10-CM | POA: Diagnosis present

## 2015-11-06 DIAGNOSIS — M1990 Unspecified osteoarthritis, unspecified site: Secondary | ICD-10-CM | POA: Diagnosis present

## 2015-11-06 DIAGNOSIS — R1013 Epigastric pain: Secondary | ICD-10-CM | POA: Diagnosis not present

## 2015-11-06 DIAGNOSIS — I4891 Unspecified atrial fibrillation: Secondary | ICD-10-CM | POA: Diagnosis not present

## 2015-11-06 DIAGNOSIS — H9190 Unspecified hearing loss, unspecified ear: Secondary | ICD-10-CM | POA: Diagnosis present

## 2015-11-06 DIAGNOSIS — E118 Type 2 diabetes mellitus with unspecified complications: Secondary | ICD-10-CM | POA: Diagnosis not present

## 2015-11-06 DIAGNOSIS — Z7984 Long term (current) use of oral hypoglycemic drugs: Secondary | ICD-10-CM | POA: Diagnosis not present

## 2015-11-06 DIAGNOSIS — K5900 Constipation, unspecified: Secondary | ICD-10-CM | POA: Diagnosis present

## 2015-11-06 DIAGNOSIS — I2510 Atherosclerotic heart disease of native coronary artery without angina pectoris: Secondary | ICD-10-CM | POA: Diagnosis present

## 2015-11-06 DIAGNOSIS — K56609 Unspecified intestinal obstruction, unspecified as to partial versus complete obstruction: Secondary | ICD-10-CM | POA: Diagnosis present

## 2015-11-06 DIAGNOSIS — K5669 Other intestinal obstruction: Secondary | ICD-10-CM

## 2015-11-06 DIAGNOSIS — Z87891 Personal history of nicotine dependence: Secondary | ICD-10-CM | POA: Diagnosis not present

## 2015-11-06 DIAGNOSIS — Z96642 Presence of left artificial hip joint: Secondary | ICD-10-CM | POA: Diagnosis present

## 2015-11-06 DIAGNOSIS — E785 Hyperlipidemia, unspecified: Secondary | ICD-10-CM | POA: Diagnosis present

## 2015-11-06 DIAGNOSIS — Z79899 Other long term (current) drug therapy: Secondary | ICD-10-CM | POA: Diagnosis not present

## 2015-11-06 LAB — GLUCOSE, CAPILLARY
GLUCOSE-CAPILLARY: 161 mg/dL — AB (ref 65–99)
Glucose-Capillary: 108 mg/dL — ABNORMAL HIGH (ref 65–99)
Glucose-Capillary: 122 mg/dL — ABNORMAL HIGH (ref 65–99)
Glucose-Capillary: 114 mg/dL — ABNORMAL HIGH (ref 65–99)

## 2015-11-06 LAB — LIPASE, BLOOD: Lipase: 32 U/L (ref 11–51)

## 2015-11-06 MED ORDER — SODIUM CHLORIDE 0.9 % IV SOLN
INTRAVENOUS | Status: DC
  Administered 2015-11-06 – 2015-11-07 (×3): via INTRAVENOUS

## 2015-11-06 MED ORDER — HYDRALAZINE HCL 20 MG/ML IJ SOLN: 10.0000 mg | Freq: Four times a day (QID) | INTRAMUSCULAR | Status: DC | PRN

## 2015-11-06 MED ORDER — HYDROMORPHONE HCL 1 MG/ML IJ SOLN: 1.0000 mg | INTRAMUSCULAR | Status: DC | PRN

## 2015-11-06 MED ORDER — ENOXAPARIN SODIUM 40 MG/0.4ML ~~LOC~~ SOLN
40.0000 mg | SUBCUTANEOUS | Status: DC
Start: 2015-11-06 — End: 2015-11-08
  Administered 2015-11-06 – 2015-11-07 (×2): 40 mg via SUBCUTANEOUS
  Filled 2015-11-06 (×2): qty 0.4

## 2015-11-06 MED ORDER — SALINE SPRAY 0.65 % NA SOLN
1.0000 | NASAL | Status: DC | PRN
  Filled 2015-11-06: qty 44

## 2015-11-06 MED ORDER — FENTANYL CITRATE (PF) 100 MCG/2ML IJ SOLN: 50.0000 ug | INTRAMUSCULAR | Status: DC | PRN

## 2015-11-06 MED ORDER — SODIUM CHLORIDE 0.9 % IV SOLN
INTRAVENOUS | Status: DC
  Administered 2015-11-06: 1000 mL via INTRAVENOUS
  Administered 2015-11-06: 06:00:00 via INTRAVENOUS

## 2015-11-06 MED ORDER — FENTANYL CITRATE (PF) 100 MCG/2ML IJ SOLN
100.0000 ug | Freq: Once | INTRAMUSCULAR | Status: AC
  Administered 2015-11-06: 100 ug via INTRAVENOUS
  Filled 2015-11-06: qty 2

## 2015-11-06 MED ORDER — ONDANSETRON HCL 4 MG/2ML IJ SOLN
4.0000 mg | Freq: Once | INTRAMUSCULAR | Status: AC
  Administered 2015-11-06: 4 mg via INTRAVENOUS
  Filled 2015-11-06: qty 2

## 2015-11-06 MED ORDER — PANTOPRAZOLE SODIUM 40 MG IV SOLR
40.0000 mg | Freq: Once | INTRAVENOUS | Status: AC
  Administered 2015-11-06: 40 mg via INTRAVENOUS
  Filled 2015-11-06: qty 40

## 2015-11-06 MED ORDER — IOHEXOL 300 MG/ML  SOLN
100.0000 mL | Freq: Once | INTRAMUSCULAR | Status: AC | PRN
  Administered 2015-11-06: 100 mL via INTRAVENOUS

## 2015-11-06 MED ORDER — SUCRALFATE 1 G PO TABS
1.0000 g | ORAL_TABLET | Freq: Once | ORAL | Status: AC
  Administered 2015-11-06: 1 g via ORAL
  Filled 2015-11-06: qty 1

## 2015-11-06 MED ORDER — SODIUM CHLORIDE 0.9 % IV SOLN: INTRAVENOUS | Status: DC

## 2015-11-06 MED ORDER — ONDANSETRON HCL 4 MG PO TABS: 4.0000 mg | ORAL_TABLET | Freq: Four times a day (QID) | ORAL | Status: DC | PRN

## 2015-11-06 MED ORDER — ONDANSETRON HCL 4 MG/2ML IJ SOLN: 4.0000 mg | Freq: Four times a day (QID) | INTRAMUSCULAR | Status: DC | PRN

## 2015-11-06 MED ORDER — BISACODYL 10 MG RE SUPP: 10.0000 mg | Freq: Every day | RECTAL | Status: DC | PRN

## 2015-11-06 MED ORDER — FENTANYL CITRATE (PF) 100 MCG/2ML IJ SOLN
50.0000 ug | Freq: Once | INTRAMUSCULAR | Status: AC
  Administered 2015-11-06: 50 ug via INTRAVENOUS
  Filled 2015-11-06: qty 2

## 2015-11-06 MED ORDER — INSULIN ASPART 100 UNIT/ML ~~LOC~~ SOLN
0.0000 [IU] | Freq: Three times a day (TID) | SUBCUTANEOUS | Status: DC
  Administered 2015-11-06: 2 [IU] via SUBCUTANEOUS
  Administered 2015-11-06 – 2015-11-07 (×3): 1 [IU] via SUBCUTANEOUS

## 2015-11-06 MED ORDER — ONDANSETRON HCL 4 MG/2ML IJ SOLN: 4.0000 mg | Freq: Three times a day (TID) | INTRAMUSCULAR | Status: DC | PRN

## 2015-11-06 MED ORDER — IOHEXOL 300 MG/ML  SOLN
25.0000 mL | Freq: Once | INTRAMUSCULAR | Status: AC | PRN
  Administered 2015-11-06: 25 mL via ORAL

## 2015-11-06 MED ORDER — ACETAMINOPHEN 650 MG RE SUPP: 650.0000 mg | Freq: Four times a day (QID) | RECTAL | Status: DC | PRN

## 2015-11-06 MED ORDER — ACETAMINOPHEN 325 MG PO TABS: 650.0000 mg | ORAL_TABLET | Freq: Four times a day (QID) | ORAL | Status: DC | PRN

## 2015-11-06 NOTE — Progress Notes (Signed)
Pt transfer from [facility] to 6N16. Pt a/o x4. VSS. Oriented to room. Paged admitting MD

## 2015-11-06 NOTE — H&P (Signed)
[facility] History and Physical  [name] [id number] DOB: [birth year] DOA: 11/05/2015  Referring physician: Emergency Department PCP: Walker Kehr, MD   CHIEF COMPLAINT:    abdominal pain, bowel obstruction      HPI: [name] is a 80 y.o. male who developed acute periumbilical abdominal pain and vomiting yesterday after eating. No alleviating factors. He had no associated diarrhea.  In fact patient has been having constipation recently. He is taking stool softeners  ED COURSE:           Labs:   WBC 12.6 Glucose 195  Urinalysis:    Cloudy, 15 ketones, negative leukocytes                          Medications  0.9 %  sodium chloride infusion ( Intravenous New Bag/Given 11/06/15 0626)  0.9 %  sodium chloride infusion (not administered)  ondansetron (ZOFRAN) injection 4 mg (not administered)  fentaNYL (SUBLIMAZE) injection 50 mcg (not administered)  sucralfate (CARAFATE) tablet 1 g (1 g Oral Given 11/06/15 0023)  ondansetron (ZOFRAN) injection 4 mg (4 mg Intravenous Given 11/06/15 0123)  fentaNYL (SUBLIMAZE) injection 50 mcg (50 mcg Intravenous Given 11/06/15 0123)  iohexol (OMNIPAQUE) 300 MG/ML solution 25 mL (25 mLs Oral Contrast Given 11/06/15 0156)  pantoprazole (PROTONIX) injection 40 mg (40 mg Intravenous Given 11/06/15 0256)  fentaNYL (SUBLIMAZE) injection 50 mcg (50 mcg Intravenous Given 11/06/15 0255)  iohexol (OMNIPAQUE) 300 MG/ML solution 100 mL (100 mLs Intravenous Contrast Given 11/06/15 0237)  fentaNYL (SUBLIMAZE) injection 100 mcg (100 mcg Intravenous Given 11/06/15 0337)    Review of Systems  Constitutional: Negative.   HENT: Negative.   Eyes: Negative.   Respiratory: Negative.   Cardiovascular: Negative.   Gastrointestinal: Positive for nausea, vomiting, abdominal pain and constipation.  Genitourinary: Negative.   Musculoskeletal: Negative.   Skin: Negative.   Neurological: Negative.   Endo/Heme/Allergies: Negative.     Psychiatric/Behavioral: Negative.     Past Medical History  Diagnosis Date  . COLONIC POLYPS, HX OF 04/26/2007  . DIABETES MELLITUS, TYPE II 04/26/2007  . HYPERTENSION 04/26/2007  . NEPHROLITHIASIS, HX OF 04/26/2007  . Unspecified hearing loss 12/18/2008  . Coronary artery disease     s/p CABG 3/12: L-LAD, S-CFX (Dr. Roxy Manns); EF 45% at cath prior to CABG  . Hyperlipidemia   . Atrial fibrillation ([place])     post op; amiodarone and coumadin continued for 3 mos post op  . Arthritis   . Cataract     beginning of cataracts  . Shortness of breath dyspnea     with exertion  . CARCINOMA, SKIN, SQUAMOUS CELL 01/07/2010   Past Surgical History  Procedure Laterality Date  . Tympanic membrane repair  1981  . Back surgery      disectomy  . Total hip arthroplasty Left     left on 04/15/07  . Colonoscopy    . Coronary artery bypass graft  01/12/2011  . Lumbar laminectomy/decompression microdiscectomy N/A 05/08/2015    Procedure: LUMBAR LAMINECTOMY/DECOMPRESSION MICRODISCECTOMY 1 LEVEL Lumbar four five;  Surgeon: Melina Schools, MD;  Location: [facility];  Service: Orthopedics;  Laterality: N/A;    SOCIAL HISTORY:  reports that he quit smoking about 34 years ago. He has never used smokeless tobacco. He reports that he does not drink alcohol or use illicit drugs. Lives: at home with wife   Assistive devices:   None needed for ambulation.   No Known Allergies  Family History  Problem Relation Age of Onset  . Heart failure Mother   . Heart disease Father   . Cancer Sister   . Cancer Brother   . Heart disease Brother   . Breast cancer Sister   . Colon cancer Neg Hx     Prior to Admission medications   Medication Sig Start Date End Date Taking? Authorizing Provider  aspirin 325 MG tablet Take 325 mg by mouth daily.   Yes Historical Provider, MD  amLODipine (NORVASC) 5 MG tablet Take 1 tablet (5 mg total) by mouth daily. 03/25/15   Fay Records, MD  cholecalciferol (VITAMIN D) 1000 UNITS tablet Take  1,000 Units by mouth 2 (two) times daily.     Historical Provider, MD  docusate sodium (COLACE) 100 MG capsule Take 1 capsule (100 mg total) by mouth 3 (three) times daily as needed for mild constipation. 05/09/15   Melina Schools, MD  HYDROcodone-acetaminophen (NORCO) 10-325 MG per tablet Take 1 tablet by mouth every 6 (six) hours as needed. Patient not taking: Reported on 08/20/2015 05/09/15   Melina Schools, MD  metFORMIN (GLUCOPHAGE) 500 MG tablet Take 1 tablet (500 mg total) by mouth 3 (three) times daily. 10/17/14   Aleksei [name] V, MD  methocarbamol (ROBAXIN) 500 MG tablet Take 1 tablet (500 mg total) by mouth 3 (three) times daily as needed for muscle spasms. Patient not taking: Reported on 08/20/2015 05/09/15   Melina Schools, MD  metoprolol tartrate (LOPRESSOR) 25 MG tablet Take 1 tablet (25 mg total) by mouth daily. 03/01/15   Fay Records, MD  ondansetron (ZOFRAN) 4 MG tablet Take 1 tablet (4 mg total) by mouth every 8 (eight) hours as needed for nausea or vomiting. 05/09/15   Melina Schools, MD  polyethylene glycol powder (GLYCOLAX) powder Take 17 g by mouth daily. 05/09/15   Melina Schools, MD  simvastatin (ZOCOR) 20 MG tablet Take 1 tablet (20 mg total) by mouth daily at 6 PM. 06/12/15   Fay Records, MD  sodium chloride (OCEAN) 0.65 % SOLN nasal spray Place 1 spray into both nostrils as needed for congestion.    Historical Provider, MD   PHYSICAL EXAM: Filed Vitals:   11/06/15 0500 11/06/15 0515 11/06/15 0530 11/06/15 0637  BP: 130/78 123/76 122/72 146/91  Pulse: 90 92 91 94  Temp:    98.2 F (36.8 C)  TempSrc:    Oral  Resp:    18  Height:      Weight:      SpO2: 89% 91% 93% 96%    Wt Readings from Last 3 Encounters:  11/05/15 99.791 kg (220 lb)  08/20/15 103.42 kg (228 lb)  05/21/15 103.42 kg (228 lb)    General:  Pleasant white male. Appears calm and comfortable Eyes: PER, normal lids, irises & conjunctiva ENT: grossly normal hearing, lips & tongue Neck: no LAD, no  masses Cardiovascular: RRR, no murmurs. No LE edema.  Respiratory: Respirations even and unlabored. Normal respiratory effort. Lungs CTA bilaterally, no wheezes / rales .   Abdomen: soft, non-distended, minimal periumbilical tenderness, active bowel sounds. No obvious masses.  Skin: no rash seen on limited exam Musculoskeletal: grossly normal tone BUE/BLE Psychiatric: grossly normal mood and affect, speech fluent and appropriate Neurologic: grossly non-focal.         LABS ON ADMISSION:    Basic Metabolic Panel:  Recent Labs Lab 11/05/15 2325  NA 137  K 3.9  CL 101  CO2 28  GLUCOSE 195*  BUN 18  CREATININE 0.93  CALCIUM 9.2    Recent Labs Lab 11/05/15 2325  LIPASE 32    CBC:  Recent Labs Lab 11/05/15 2325  WBC 12.6*  NEUTROABS 10.5*  HGB 14.4  HCT 42.9  MCV 92.1  PLT 246    CREATININE: 0.93 (11/05/15 2325) Estimated creatinine clearance - 75 mL/min  Radiological Exams on Admission: Ct Abdomen Pelvis W Contrast  11/06/2015  CLINICAL DATA:  Epigastric and mid abdominal pain. EXAM: CT ABDOMEN AND PELVIS WITH CONTRAST TECHNIQUE: Multidetector CT imaging of the abdomen and pelvis was performed using the standard protocol following bolus administration of intravenous contrast. CONTRAST:  127mL OMNIPAQUE IOHEXOL 300 MG/ML  SOLN COMPARISON:  None available FINDINGS: Lower chest and abdominal wall:  Extensive coronary atherosclerosis. Poor esophageal clearance or gastroesophageal reflux Hepatobiliary: No focal liver abnormality.No evidence of biliary obstruction or stone. Pancreas: Unremarkable. Spleen: Unremarkable. Adrenals/Urinary Tract: Negative adrenals. No hydronephrosis or stone. Borderline bladder wall thickening. Reproductive:Negative for age Stomach/Bowel: Proximal small bowel is full of fluid and gas with focal transition point in the superficial right abdomen, best seen on coronal reformats image 25. There is no evidence of hernia, mass lesion, volvulus, or  previous surgery in this region to explain the narrowing. Mild reactive appearing interloop fluid and mesenteric fat congestion. No evidence of bowel perforation. Vascular/Lymphatic: No acute vascular abnormality. No mass or adenopathy. Peritoneal: No ascites or pneumoperitoneum. Musculoskeletal: Multilevel disc and facet degeneration with L4-5 laminectomy defect. Left hip arthroplasty. No acute finding. IMPRESSION: Partial or early small bowel obstruction with transition point in the right abdomen. No underlying cause identified. Electronically Signed   By: Monte Fantasia M.D.   On: 11/06/2015 03:19    ASSESSMENT / PLAN   Partial small bowel obstruction. Etiology unclear, no history of abdominal surgeries. Infectious etiology possible. Exam not overly impressive, he has bowel sounds, no significant distention. Surgery following.  -bowel rest -hopefully clears by tomorrow -If worsens place NGT   Diabetes mellitus, type 2  -hold home oral diabetic meds -monitor CBGs and start SSI   Essential hypertension.  -hold home PO meds -PRN IV Hydralazine   -CAD / CABG 2012    CONSULTANTS:   Surgery  Code Status:  Full DVT Prophylaxis: Lovenox Heparin, already anti-coagulated Family Communication:  Patient alert, oriented and understands plan of care.   Disposition Plan: Discharge to home in 24-48 hours   Time spent: 60 minutes Tye Savoy  NP [facility] Pager [phone]

## 2015-11-06 NOTE — ED Provider Notes (Signed)
CSN: [id number]     Arrival date & time 11/05/15  2305 History   First MD Initiated Contact with Patient 11/06/15 0107     Chief Complaint  Patient presents with  . Abdominal Pain     (Consider location/radiation/quality/duration/timing/severity/associated sxs/prior Treatment) HPI  This is a 80 year old male with abdominal pain that began yesterday evening about 6 PM. The onset was gradual. The pain is located in the periumbilical region. He is having difficulty characterizing it but it is moderate to severe in intensity. There is some waxing and waning of it. It is somewhat worse with palpation but not with movement. He has had nausea and one episode of vomiting. He denies diarrhea. He has had constipation. He denies fever, chills, dysuria or hematuria.   He has been taking ibuprofen frequently for rib pain the last 2 weeks and is concerned he may have an ulcer. He had no relief with Maalox. He had no relief with Carafate given here an hour ago.  Past Medical History  Diagnosis Date  . COLONIC POLYPS, HX OF 04/26/2007  . DIABETES MELLITUS, TYPE II 04/26/2007  . HYPERTENSION 04/26/2007  . NEPHROLITHIASIS, HX OF 04/26/2007  . Unspecified hearing loss 12/18/2008  . Coronary artery disease     s/p CABG 3/12: L-LAD, S-CFX (Dr. Roxy Manns); EF 45% at cath prior to CABG  . Hyperlipidemia   . Atrial fibrillation ([place])     post op; amiodarone and coumadin continued for 3 mos post op  . Arthritis   . Cataract     beginning of cataracts  . Shortness of breath dyspnea     with exertion  . CARCINOMA, SKIN, SQUAMOUS CELL 01/07/2010   Past Surgical History  Procedure Laterality Date  . Tympanic membrane repair  1981  . Back surgery      disectomy  . Total hip arthroplasty Left     left on 04/15/07  . Colonoscopy    . Coronary artery bypass graft  01/12/2011  . Lumbar laminectomy/decompression microdiscectomy N/A 05/08/2015    Procedure: LUMBAR LAMINECTOMY/DECOMPRESSION MICRODISCECTOMY 1 LEVEL Lumbar  four five;  Surgeon: Melina Schools, MD;  Location: [facility];  Service: Orthopedics;  Laterality: N/A;   Family History  Problem Relation Age of Onset  . Heart failure Mother   . Heart disease Father   . Cancer Sister   . Cancer Brother   . Heart disease Brother   . Breast cancer Sister   . Colon cancer Neg Hx    Social History  Substance Use Topics  . Smoking status: Former Smoker    Quit date: 01/02/1981  . Smokeless tobacco: Never Used  . Alcohol Use: No    Review of Systems  All other systems reviewed and are negative.   Allergies  Review of patient's allergies indicates no known allergies.  Home Medications   Prior to Admission medications   Medication Sig Start Date End Date Taking? Authorizing Provider  aspirin 325 MG tablet Take 325 mg by mouth daily.   Yes Historical Provider, MD  amLODipine (NORVASC) 5 MG tablet Take 1 tablet (5 mg total) by mouth daily. 03/25/15   Fay Records, MD  cholecalciferol (VITAMIN D) 1000 UNITS tablet Take 1,000 Units by mouth 2 (two) times daily.     Historical Provider, MD  docusate sodium (COLACE) 100 MG capsule Take 1 capsule (100 mg total) by mouth 3 (three) times daily as needed for mild constipation. 05/09/15   Melina Schools, MD  HYDROcodone-acetaminophen ([place]) 10-325 MG per  tablet Take 1 tablet by mouth every 6 (six) hours as needed. Patient not taking: Reported on 08/20/2015 05/09/15   Melina Schools, MD  metFORMIN (GLUCOPHAGE) 500 MG tablet Take 1 tablet (500 mg total) by mouth 3 (three) times daily. 10/17/14   Aleksei [name] V, MD  methocarbamol (ROBAXIN) 500 MG tablet Take 1 tablet (500 mg total) by mouth 3 (three) times daily as needed for muscle spasms. Patient not taking: Reported on 08/20/2015 05/09/15   Melina Schools, MD  metoprolol tartrate (LOPRESSOR) 25 MG tablet Take 1 tablet (25 mg total) by mouth daily. 03/01/15   Fay Records, MD  ondansetron (ZOFRAN) 4 MG tablet Take 1 tablet (4 mg total) by mouth every 8 (eight) hours as  needed for nausea or vomiting. 05/09/15   Melina Schools, MD  polyethylene glycol powder (GLYCOLAX) powder Take 17 g by mouth daily. 05/09/15   Melina Schools, MD  simvastatin (ZOCOR) 20 MG tablet Take 1 tablet (20 mg total) by mouth daily at 6 PM. 06/12/15   Fay Records, MD  sodium chloride (OCEAN) 0.65 % SOLN nasal spray Place 1 spray into both nostrils as needed for congestion.    Historical Provider, MD   BP 168/80 mmHg  Pulse 84  Temp(Src) 98.2 F (36.8 C) (Oral)  Resp 18  Ht 5\' 8"  (1.727 m)  Wt 220 lb (99.791 kg)  BMI 33.46 kg/m2  SpO2 97%   Physical Exam  General: Well-developed, well-nourished male in no acute distress; appearance consistent with age of record HENT: normocephalic; atraumatic Eyes: pupils equal, round and reactive to light; extraocular muscles intact Neck: supple Heart: regular rate and rhythm Lungs: clear to auscultation bilaterally Abdomen: soft; nondistended; periumbilical tenderness on deep palpation; no masses or hepatosplenomegaly; bowel sounds present Extremities: No deformity; full range of motion; pulses normal Neurologic: Awake, alert and oriented; motor function intact in all extremities and symmetric; no facial droop Skin: Warm and dry Psychiatric: Flat affect    ED Course  Procedures (including critical care time)   MDM   Nursing notes and vitals signs, including pulse oximetry, reviewed.  Summary of this visit's results, reviewed by myself:  Labs:  Results for orders placed or performed during the hospital encounter of 11/05/15 (from the past 24 hour(s))  CBC with Differential     Status: Abnormal   Collection Time: 11/05/15 11:25 PM  Result Value Ref Range   WBC 12.6 (H) 4.0 - 10.5 K/uL   RBC 4.66 4.22 - 5.81 MIL/uL   Hemoglobin 14.4 13.0 - 17.0 g/dL   HCT 42.9 39.0 - 52.0 %   MCV 92.1 78.0 - 100.0 fL   MCH 30.9 26.0 - 34.0 pg   MCHC 33.6 30.0 - 36.0 g/dL   RDW 12.6 11.5 - 15.5 %   Platelets 246 150 - 400 K/uL   Neutrophils  Relative % 83 %   Neutro Abs 10.5 (H) 1.7 - 7.7 K/uL   Lymphocytes Relative 11 %   Lymphs Abs 1.3 0.7 - 4.0 K/uL   Monocytes Relative 5 %   Monocytes Absolute 0.7 0.1 - 1.0 K/uL   Eosinophils Relative 1 %   Eosinophils Absolute 0.1 0.0 - 0.7 K/uL   Basophils Relative 0 %   Basophils Absolute 0.0 0.0 - 0.1 K/uL  Basic metabolic panel     Status: Abnormal   Collection Time: 11/05/15 11:25 PM  Result Value Ref Range   Sodium 137 135 - 145 mmol/L   Potassium 3.9 3.5 - 5.1 mmol/L  Chloride 101 101 - 111 mmol/L   CO2 28 22 - 32 mmol/L   Glucose, Bld 195 (H) 65 - 99 mg/dL   BUN 18 6 - 20 mg/dL   Creatinine, Ser 0.93 0.61 - 1.24 mg/dL   Calcium 9.2 8.9 - 10.3 mg/dL   GFR calc non Af Amer >60 >60 mL/min   GFR calc Af Amer >60 >60 mL/min   Anion gap 8 5 - 15  Lipase, blood     Status: None   Collection Time: 11/05/15 11:25 PM  Result Value Ref Range   Lipase 32 11 - 51 U/L  Urinalysis, Routine w reflex microscopic (not at [facility])     Status: Abnormal   Collection Time: 11/05/15 11:32 PM  Result Value Ref Range   Color, Urine YELLOW YELLOW   APPearance CLOUDY (A) CLEAR   Specific Gravity, Urine 1.018 1.005 - 1.030   pH 7.0 5.0 - 8.0   Glucose, UA NEGATIVE NEGATIVE mg/dL   Hgb urine dipstick NEGATIVE NEGATIVE   Bilirubin Urine NEGATIVE NEGATIVE   Ketones, ur 15 (A) NEGATIVE mg/dL   Protein, ur NEGATIVE NEGATIVE mg/dL   Nitrite NEGATIVE NEGATIVE   Leukocytes, UA NEGATIVE NEGATIVE    Imaging Studies: Ct Abdomen Pelvis W Contrast  11/06/2015  CLINICAL DATA:  Epigastric and mid abdominal pain. EXAM: CT ABDOMEN AND PELVIS WITH CONTRAST TECHNIQUE: Multidetector CT imaging of the abdomen and pelvis was performed using the standard protocol following bolus administration of intravenous contrast. CONTRAST:  152mL OMNIPAQUE IOHEXOL 300 MG/ML  SOLN COMPARISON:  None available FINDINGS: Lower chest and abdominal wall:  Extensive coronary atherosclerosis. Poor esophageal clearance or  gastroesophageal reflux Hepatobiliary: No focal liver abnormality.No evidence of biliary obstruction or stone. Pancreas: Unremarkable. Spleen: Unremarkable. Adrenals/Urinary Tract: Negative adrenals. No hydronephrosis or stone. Borderline bladder wall thickening. Reproductive:Negative for age Stomach/Bowel: Proximal small bowel is full of fluid and gas with focal transition point in the superficial right abdomen, best seen on coronal reformats image 25. There is no evidence of hernia, mass lesion, volvulus, or previous surgery in this region to explain the narrowing. Mild reactive appearing interloop fluid and mesenteric fat congestion. No evidence of bowel perforation. Vascular/Lymphatic: No acute vascular abnormality. No mass or adenopathy. Peritoneal: No ascites or pneumoperitoneum. Musculoskeletal: Multilevel disc and facet degeneration with L4-5 laminectomy defect. Left hip arthroplasty. No acute finding. IMPRESSION: Partial or early small bowel obstruction with transition point in the right abdomen. No underlying cause identified. Electronically Signed   By: Monte Fantasia M.D.   On: 11/06/2015 03:19       Shanon Rosser, MD 11/06/15 [phone]

## 2015-11-06 NOTE — Progress Notes (Signed)
Patient is a 80 year old male with a history of abdominal pain which started yesterday.  Patient was seen at  [facility]  Emergency department where a CT scan was obtained showing possible early /partial small bowel obstruction.  ED physician Dr. Marisue Humble contacted  Dr. Brantley Stage of general surgery regarding patient.  Vitals within normal limits. Initial blood work unremarkable except for mildly elevated blood glucose level of 195. Accepting transfer to [facility] observation  to [facility] to a MedSurg bed. General surgery to follow.

## 2015-11-06 NOTE — Consult Note (Signed)
Reason for Consult:SBO Referring Physician: Orvilla Cornwall MD  [name] is an 80 y.o. male.  HPI: Pt sent from  [facility] ED for abdominal pain.  1 day hx of epigastric abdominal pain.  2 episodes of vomiting.  Had BM yesterday.  No previous surgery.  Pain better now.  CT shows pSBO.    Past Medical History  Diagnosis Date  . COLONIC POLYPS, HX OF 04/26/2007  . DIABETES MELLITUS, TYPE II 04/26/2007  . HYPERTENSION 04/26/2007  . NEPHROLITHIASIS, HX OF 04/26/2007  . Unspecified hearing loss 12/18/2008  . Coronary artery disease     s/p CABG 3/12: L-LAD, S-CFX (Dr. Roxy Manns); EF 45% at cath prior to CABG  . Hyperlipidemia   . Atrial fibrillation ([place])     post op; amiodarone and coumadin continued for 3 mos post op  . Arthritis   . Cataract     beginning of cataracts  . Shortness of breath dyspnea     with exertion  . CARCINOMA, SKIN, SQUAMOUS CELL 01/07/2010    Past Surgical History  Procedure Laterality Date  . Tympanic membrane repair  1981  . Back surgery      disectomy  . Total hip arthroplasty Left     left on 04/15/07  . Colonoscopy    . Coronary artery bypass graft  01/12/2011  . Lumbar laminectomy/decompression microdiscectomy N/A 05/08/2015    Procedure: LUMBAR LAMINECTOMY/DECOMPRESSION MICRODISCECTOMY 1 LEVEL Lumbar four five;  Surgeon: Melina Schools, MD;  Location: [place];  Service: Orthopedics;  Laterality: N/A;    Family History  Problem Relation Age of Onset  . Heart failure Mother   . Heart disease Father   . Cancer Sister   . Cancer Brother   . Heart disease Brother   . Breast cancer Sister   . Colon cancer Neg Hx     Social History:  reports that he quit smoking about 34 years ago. He has never used smokeless tobacco. He reports that he does not drink alcohol or use illicit drugs.  Allergies: No Known Allergies  Medications: I have reviewed the patient's current medications.  Results for orders placed or performed during the hospital encounter of 11/05/15  (from the past 48 hour(s))  CBC with Differential     Status: Abnormal   Collection Time: 11/05/15 11:25 PM  Result Value Ref Range   WBC 12.6 (H) 4.0 - 10.5 K/uL   RBC 4.66 4.22 - 5.81 MIL/uL   Hemoglobin 14.4 13.0 - 17.0 g/dL   HCT 42.9 39.0 - 52.0 %   MCV 92.1 78.0 - 100.0 fL   MCH 30.9 26.0 - 34.0 pg   MCHC 33.6 30.0 - 36.0 g/dL   RDW 12.6 11.5 - 15.5 %   Platelets 246 150 - 400 K/uL   Neutrophils Relative % 83 %   Neutro Abs 10.5 (H) 1.7 - 7.7 K/uL   Lymphocytes Relative 11 %   Lymphs Abs 1.3 0.7 - 4.0 K/uL   Monocytes Relative 5 %   Monocytes Absolute 0.7 0.1 - 1.0 K/uL   Eosinophils Relative 1 %   Eosinophils Absolute 0.1 0.0 - 0.7 K/uL   Basophils Relative 0 %   Basophils Absolute 0.0 0.0 - 0.1 K/uL  Basic metabolic panel     Status: Abnormal   Collection Time: 11/05/15 11:25 PM  Result Value Ref Range   Sodium 137 135 - 145 mmol/L   Potassium 3.9 3.5 - 5.1 mmol/L   Chloride 101 101 - 111 mmol/L  CO2 28 22 - 32 mmol/L   Glucose, Bld 195 (H) 65 - 99 mg/dL   BUN 18 6 - 20 mg/dL   Creatinine, Ser 0.93 0.61 - 1.24 mg/dL   Calcium 9.2 8.9 - 10.3 mg/dL   GFR calc non Af Amer >60 >60 mL/min   GFR calc Af Amer >60 >60 mL/min    Comment: (NOTE) The eGFR has been calculated using the CKD EPI equation. This calculation has not been validated in all clinical situations. eGFR's persistently <60 mL/min signify possible Chronic Kidney Disease.    Anion gap 8 5 - 15  Lipase, blood     Status: None   Collection Time: 11/05/15 11:25 PM  Result Value Ref Range   Lipase 32 11 - 51 U/L  Urinalysis, Routine w reflex microscopic (not at [facility])     Status: Abnormal   Collection Time: 11/05/15 11:32 PM  Result Value Ref Range   Color, Urine YELLOW YELLOW   APPearance CLOUDY (A) CLEAR   Specific Gravity, Urine 1.018 1.005 - 1.030   pH 7.0 5.0 - 8.0   Glucose, UA NEGATIVE NEGATIVE mg/dL   Hgb urine dipstick NEGATIVE NEGATIVE   Bilirubin Urine NEGATIVE NEGATIVE   Ketones, ur 15  (A) NEGATIVE mg/dL   Protein, ur NEGATIVE NEGATIVE mg/dL   Nitrite NEGATIVE NEGATIVE   Leukocytes, UA NEGATIVE NEGATIVE    Comment: MICROSCOPIC NOT DONE ON URINES WITH NEGATIVE PROTEIN, BLOOD, LEUKOCYTES, NITRITE, OR GLUCOSE <1000 mg/dL.    Ct Abdomen Pelvis W Contrast  11/06/2015  CLINICAL DATA:  Epigastric and mid abdominal pain. EXAM: CT ABDOMEN AND PELVIS WITH CONTRAST TECHNIQUE: Multidetector CT imaging of the abdomen and pelvis was performed using the standard protocol following bolus administration of intravenous contrast. CONTRAST:  122m OMNIPAQUE IOHEXOL 300 MG/ML  SOLN COMPARISON:  None available FINDINGS: Lower chest and abdominal wall:  Extensive coronary atherosclerosis. Poor esophageal clearance or gastroesophageal reflux Hepatobiliary: No focal liver abnormality.No evidence of biliary obstruction or stone. Pancreas: Unremarkable. Spleen: Unremarkable. Adrenals/Urinary Tract: Negative adrenals. No hydronephrosis or stone. Borderline bladder wall thickening. Reproductive:Negative for age Stomach/Bowel: Proximal small bowel is full of fluid and gas with focal transition point in the superficial right abdomen, best seen on coronal reformats image 25. There is no evidence of hernia, mass lesion, volvulus, or previous surgery in this region to explain the narrowing. Mild reactive appearing interloop fluid and mesenteric fat congestion. No evidence of bowel perforation. Vascular/Lymphatic: No acute vascular abnormality. No mass or adenopathy. Peritoneal: No ascites or pneumoperitoneum. Musculoskeletal: Multilevel disc and facet degeneration with L4-5 laminectomy defect. Left hip arthroplasty. No acute finding. IMPRESSION: Partial or early small bowel obstruction with transition point in the right abdomen. No underlying cause identified. Electronically Signed   By: JMonte FantasiaM.D.   On: 11/06/2015 03:19    Review of Systems  Constitutional: Negative for chills and weight loss.  HENT:  Negative.   Eyes: Negative.   Respiratory: Negative for cough and wheezing.   Cardiovascular: Negative for chest pain.  Gastrointestinal: Positive for nausea, vomiting, abdominal pain and constipation.  Genitourinary: Negative.   Psychiatric/Behavioral: Negative.    Blood pressure 146/91, pulse 94, temperature 98.2 F (36.8 C), temperature source Oral, resp. rate 18, height '5\' 8"'  (1.727 m), weight 99.791 kg (220 lb), SpO2 96 %. Physical Exam  Constitutional: He appears well-developed and well-nourished.  HENT:  Head: Normocephalic and atraumatic.  Eyes: Pupils are equal, round, and reactive to light. No scleral icterus.  Neck: Normal range of  motion.  Cardiovascular: Normal rate.   Respiratory: Effort normal.  GI: Soft. He exhibits distension. He exhibits no mass. There is tenderness. There is no rebound and no guarding.  Musculoskeletal: Normal range of motion.  Neurological: He is alert.  Skin: Skin is warm and dry.    Assessment/Plan: pSBO NPO No NG for now  Recheck plain films of abdomen and  Follow   Immanuel Fedak A. 11/06/2015, 7:25 AM

## 2015-11-07 ENCOUNTER — Inpatient Hospital Stay (HOSPITAL_COMMUNITY): Payer: Medicare Other

## 2015-11-07 LAB — BASIC METABOLIC PANEL
Anion gap: 9 (ref 5–15)
BUN: 9 mg/dL (ref 6–20)
CALCIUM: 8.5 mg/dL — AB (ref 8.9–10.3)
CO2: 24 mmol/L (ref 22–32)
CREATININE: 1 mg/dL (ref 0.61–1.24)
Chloride: 105 mmol/L (ref 101–111)
GFR calc Af Amer: >60 mL/min (ref >60)
GFR calc non Af Amer: >60 mL/min (ref >60)
GLUCOSE: 149 mg/dL — AB (ref 65–99)
Potassium: 3.6 mmol/L (ref 3.5–5.1)
Sodium: 138 mmol/L (ref 135–145)

## 2015-11-07 LAB — CBC
HCT: 38.8 % — ABNORMAL LOW (ref 39.0–52.0)
HEMOGLOBIN: 12.9 g/dL — AB (ref 13.0–17.0)
MCH: 31.3 pg (ref 26.0–34.0)
MCHC: 33.2 g/dL (ref 30.0–36.0)
MCV: 94.2 fL (ref 78.0–100.0)
PLATELETS: 199 10*3/uL (ref 150–400)
RBC: 4.12 MIL/uL — ABNORMAL LOW (ref 4.22–5.81)
RDW: 12.8 % (ref 11.5–15.5)
WBC: 6.3 10*3/uL (ref 4.0–10.5)

## 2015-11-07 LAB — GLUCOSE, CAPILLARY
GLUCOSE-CAPILLARY: 135 mg/dL — AB (ref 65–99)
GLUCOSE-CAPILLARY: 111 mg/dL — AB (ref 65–99)
Glucose-Capillary: 126 mg/dL — ABNORMAL HIGH (ref 65–99)
Glucose-Capillary: 95 mg/dL (ref 65–99)

## 2015-11-07 MED ORDER — DIATRIZOATE MEGLUMINE & SODIUM 66-10 % PO SOLN
90.0000 mL | Freq: Once | ORAL | Status: AC
  Administered 2015-11-07: 90 mL via ORAL
  Filled 2015-11-07: qty 90

## 2015-11-07 NOTE — Progress Notes (Signed)
[facility] PROGRESS NOTE  [name] [id number] DOB: 1934/12/18 DOA: 11/05/2015 PCP: Walker Kehr, MD  Assessment/Plan: 1. Partial Small Bowel Obstruction. -Patient presenting with complaints of abdominal pain associated with nausea and vomiting. CT scan in the emergency department showed findings suggestive of partial small bowel obstruction. -He was made nothing by mouth. Surgery consulted. -This morning he did not have further episodes of nausea vomiting. NG tube not placed. -Abdominal x-ray done this morning showing no evidence of bowel obstruction.   2.  Type 2 diabetes mellitus - blood sugars controlled  -Continue sliding scale coverage.   3.   Hypertension  -Blood pressures improving, continue as needed hydralazine   Code Status: FULL code  Family Communication Family members not present at bedside  Disposition Plan: Patient admitted for small bowel obstruction yesterday. Anticipate advancement of diet per surgery.    Consultants:  General surgery    HPI/Subjective: [name] is a pleasant 80 year old gentleman with a past medical history of hypertension and diabetes mellitus who was admitted to the medicine service on 11/06/2015 19 with complaints of periumbilical pain social with nausea vomiting. He was worked up with a CT scan of abdomen and pelvis with IV contrast that showed partial or early small bowel obstruction. General surgery was consulted. He was made nothing by mouth, started on IV fluids.   Objective: Filed Vitals:   11/07/15 0525 11/07/15 1350  BP: 139/72 138/82  Pulse: 71 76  Temp: 98.6 F (37 C)   Resp: 17 16    Intake/Output Summary (Last 24 hours) at 11/07/15 1636 Last data filed at 11/07/15 1249  Gross per 24 hour  Intake 1682.5 ml  Output    450 ml  Net 1232.5 ml   Filed Weights   11/05/15 2312  Weight: 99.791 kg (220 lb)    Exam:   General:  patient is awake alert, no acute distress sitting comfortably    Cardiovascular: Regular rate and rhythm normal S1-S2 no murmurs rubs gallops   Respiratory: clear to auscultation bilaterally   Abdomen: His abdomen was soft, there is some pain to palpation over the periumbilical region. It was not distended, had hypoactive bowel sounds.   Musculoskeletal: No edema    Data Reviewed: Basic Metabolic Panel:  Recent Labs Lab 11/05/15 2325 11/07/15 0525  NA 137 138  K 3.9 3.6  CL 101 105  CO2 28 24  GLUCOSE 195* 149*  BUN 18 9  CREATININE 0.93 1.00  CALCIUM 9.2 8.5*   Liver Function Tests: No results for input(s): AST, ALT, ALKPHOS, BILITOT, PROT, ALBUMIN in the last 168 hours.  Recent Labs Lab 11/05/15 2325  LIPASE 32   No results for input(s): AMMONIA in the last 168 hours. CBC:  Recent Labs Lab 11/05/15 2325 11/07/15 0525  WBC 12.6* 6.3  NEUTROABS 10.5*  --   HGB 14.4 12.9*  HCT 42.9 38.8*  MCV 92.1 94.2  PLT 246 199   Cardiac Enzymes: No results for input(s): CKTOTAL, CKMB, CKMBINDEX, TROPONINI in the last 168 hours. BNP (last 3 results) No results for input(s): BNP in the last 8760 hours.  ProBNP (last 3 results) No results for input(s): PROBNP in the last 8760 hours.  CBG:  Recent Labs Lab 11/06/15 1146 11/06/15 1658 11/06/15 2118 11/07/15 0722 11/07/15 1123  GLUCAP 108* 122* 114* 135* 126*    No results found for this or any previous visit (from the past 240 hour(s)).   Studies: Dg Abd 1 View  11/06/2015  CLINICAL  DATA:  Small bowel obstruction. EXAM: ABDOMEN - 1 VIEW COMPARISON:  CT abdomen pelvis 11/06/2015. FINDINGS: There is mild gaseous distension of small bowel in the central abdomen. Oral contrast and scattered gas in nondistended colon. No unexpected radiopaque calculi. Contrast is seen in the bladder. Left hip arthroplasty. IMPRESSION: Mild residual small bowel dilatation, indicative of a partial or resolving small bowel obstruction. Electronically Signed   By: Lorin Picket M.D.   On:  11/06/2015 08:23   Ct Abdomen Pelvis W Contrast  11/06/2015  CLINICAL DATA:  Epigastric and mid abdominal pain. EXAM: CT ABDOMEN AND PELVIS WITH CONTRAST TECHNIQUE: Multidetector CT imaging of the abdomen and pelvis was performed using the standard protocol following bolus administration of intravenous contrast. CONTRAST:  182mL OMNIPAQUE IOHEXOL 300 MG/ML  SOLN COMPARISON:  None available FINDINGS: Lower chest and abdominal wall:  Extensive coronary atherosclerosis. Poor esophageal clearance or gastroesophageal reflux Hepatobiliary: No focal liver abnormality.No evidence of biliary obstruction or stone. Pancreas: Unremarkable. Spleen: Unremarkable. Adrenals/Urinary Tract: Negative adrenals. No hydronephrosis or stone. Borderline bladder wall thickening. Reproductive:Negative for age Stomach/Bowel: Proximal small bowel is full of fluid and gas with focal transition point in the superficial right abdomen, best seen on coronal reformats image 25. There is no evidence of hernia, mass lesion, volvulus, or previous surgery in this region to explain the narrowing. Mild reactive appearing interloop fluid and mesenteric fat congestion. No evidence of bowel perforation. Vascular/Lymphatic: No acute vascular abnormality. No mass or adenopathy. Peritoneal: No ascites or pneumoperitoneum. Musculoskeletal: Multilevel disc and facet degeneration with L4-5 laminectomy defect. Left hip arthroplasty. No acute finding. IMPRESSION: Partial or early small bowel obstruction with transition point in the right abdomen. No underlying cause identified. Electronically Signed   By: Monte Fantasia M.D.   On: 11/06/2015 03:19   Dg Abd Portable 1v  11/07/2015  CLINICAL DATA:  Small bowel obstruction. EXAM: PORTABLE ABDOMEN - 1 VIEW COMPARISON:  11/06/2015 FINDINGS: Mildly prominent but nondilated loops of small bowel in the mid abdomen. Residual contrast in the transverse/left colon. Mild degenerative changes of the lower lumbar spine.  Left hip arthroplasty. IMPRESSION: No evidence of bowel obstruction. Residual contrast in the transverse/left colon. Electronically Signed   By: Julian Hy M.D.   On: 11/07/2015 11:30    Scheduled Meds: . enoxaparin (LOVENOX) injection  40 mg Subcutaneous Q24H  . insulin aspart  0-9 Units Subcutaneous TID WC   Continuous Infusions: . sodium chloride 75 mL/hr at 11/07/15 [id number]    Active Problems:   Diabetes mellitus, type 2 ([place])   Essential hypertension   Afib ([place])   CAD (coronary artery disease), native coronary artery   Small bowel obstruction ([place])   Diabetes mellitus with complication ([place])    Time spent: 25 min    Kelvin Cellar  [facility] Pager [phone]. If 7PM-7AM, please contact night-coverage at [email or web address], password [facility] 11/07/2015, 4:36 PM  LOS: 1 day

## 2015-11-07 NOTE — Progress Notes (Signed)
[facility] Progress Note     Subjective: Pt not doing much better.  Still bloated and tender in mid abdomen.  No N/V, doesn't want NG.  Ambulating well through halls, only minimal flatus.  No BM since 12/20.    Objective: Vital signs in last 24 hours: Temp:  [98.2 F (36.8 C)-98.6 F (37 C)] 98.6 F (37 C) (12/22 0525) Pulse Rate:  [69-73] 71 (12/22 0525) Resp:  [17-18] 17 (12/22 0525) BP: (139-162)/(65-72) 139/72 mmHg (12/22 0525) SpO2:  [95 %-99 %] 95 % (12/22 0525) Last BM Date: 11/05/15  Intake/Output from previous day: 12/21 0701 - 12/22 0700 In: 1038.8 [P.O.:60; I.V.:978.8] Out: 950 [Urine:950] Intake/Output this shift: Total I/O In: 726.3 [I.V.:726.3] Out: -   PE: Gen:  Alert, NAD, pleasant Abd: Obese, soft, distended, tender in central abdomen, only very few BS, no abdominal scars noted, no hernias palpated   Lab Results:   Recent Labs  11/05/15 2325 11/07/15 0525  WBC 12.6* 6.3  HGB 14.4 12.9*  HCT 42.9 38.8*  PLT 246 199   BMET  Recent Labs  11/05/15 2325 11/07/15 0525  NA 137 138  K 3.9 3.6  CL 101 105  CO2 28 24  GLUCOSE 195* 149*  BUN 18 9  CREATININE 0.93 1.00  CALCIUM 9.2 8.5*   PT/INR No results for input(s): LABPROT, INR in the last 72 hours. CMP     Component Value Date/Time   NA 138 11/07/2015 0525   K 3.6 11/07/2015 0525   CL 105 11/07/2015 0525   CO2 24 11/07/2015 0525   GLUCOSE 149* 11/07/2015 0525   GLUCOSE 120* 11/05/2006 1123   BUN 9 11/07/2015 0525   CREATININE 1.00 11/07/2015 0525   CALCIUM 8.5* 11/07/2015 0525   PROT 6.7 04/11/2014 0806   ALBUMIN 3.8 04/11/2014 0806   AST 24 04/11/2014 0806   ALT 24 04/11/2014 0806   ALKPHOS 41 04/11/2014 0806   BILITOT 0.8 04/11/2014 0806   GFRNONAA >60 11/07/2015 0525   GFRAA >60 11/07/2015 0525   Lipase     Component Value Date/Time   LIPASE 32 11/05/2015 2325       Studies/Results: Dg Abd 1 View  11/06/2015  CLINICAL DATA:  Small bowel  obstruction. EXAM: ABDOMEN - 1 VIEW COMPARISON:  CT abdomen pelvis 11/06/2015. FINDINGS: There is mild gaseous distension of small bowel in the central abdomen. Oral contrast and scattered gas in nondistended colon. No unexpected radiopaque calculi. Contrast is seen in the bladder. Left hip arthroplasty. IMPRESSION: Mild residual small bowel dilatation, indicative of a partial or resolving small bowel obstruction. Electronically Signed   By: Lorin Picket M.D.   On: 11/06/2015 08:23   Ct Abdomen Pelvis W Contrast  11/06/2015  CLINICAL DATA:  Epigastric and mid abdominal pain. EXAM: CT ABDOMEN AND PELVIS WITH CONTRAST TECHNIQUE: Multidetector CT imaging of the abdomen and pelvis was performed using the standard protocol following bolus administration of intravenous contrast. CONTRAST:  175mL OMNIPAQUE IOHEXOL 300 MG/ML  SOLN COMPARISON:  None available FINDINGS: Lower chest and abdominal wall:  Extensive coronary atherosclerosis. Poor esophageal clearance or gastroesophageal reflux Hepatobiliary: No focal liver abnormality.No evidence of biliary obstruction or stone. Pancreas: Unremarkable. Spleen: Unremarkable. Adrenals/Urinary Tract: Negative adrenals. No hydronephrosis or stone. Borderline bladder wall thickening. Reproductive:Negative for age Stomach/Bowel: Proximal small bowel is full of fluid and gas with focal transition point in the superficial right abdomen, best seen on coronal reformats image 25. There is no evidence of hernia, mass lesion, volvulus,  or previous surgery in this region to explain the narrowing. Mild reactive appearing interloop fluid and mesenteric fat congestion. No evidence of bowel perforation. Vascular/Lymphatic: No acute vascular abnormality. No mass or adenopathy. Peritoneal: No ascites or pneumoperitoneum. Musculoskeletal: Multilevel disc and facet degeneration with L4-5 laminectomy defect. Left hip arthroplasty. No acute finding. IMPRESSION: Partial or early small bowel  obstruction with transition point in the right abdomen. No underlying cause identified. Electronically Signed   By: Monte Fantasia M.D.   On: 11/06/2015 03:19    Anti-infectives: Anti-infectives    None       Assessment/Plan PSBO, no h/o surgery -Patient doesn't want NG and no N/V, but pain/distension hasn't resolved.  Will try SB protocol orally without NG.  If contrast in colon after 8 hours then advance diet to clears, and can likely discharge home on fulls -IVF, pain control, antiemetics -Otherwise NPO -Ambulate and IS -SCD's and lovenox -Dulcolax suppository    LOS: 1 day    Nat Christen 11/07/2015, 9:05 AM Pager: [phone]

## 2015-11-08 LAB — BASIC METABOLIC PANEL
ANION GAP: 8 (ref 5–15)
BUN: 7 mg/dL (ref 6–20)
CO2: 26 mmol/L (ref 22–32)
Calcium: 8.6 mg/dL — ABNORMAL LOW (ref 8.9–10.3)
Chloride: 105 mmol/L (ref 101–111)
Creatinine, Ser: 0.84 mg/dL (ref 0.61–1.24)
GFR calc Af Amer: >60 mL/min (ref >60)
GFR calc non Af Amer: >60 mL/min (ref >60)
GLUCOSE: 105 mg/dL — AB (ref 65–99)
POTASSIUM: 3.5 mmol/L (ref 3.5–5.1)
Sodium: 139 mmol/L (ref 135–145)

## 2015-11-08 LAB — CBC
HEMATOCRIT: 39.3 % (ref 39.0–52.0)
HEMOGLOBIN: 13.3 g/dL (ref 13.0–17.0)
MCH: 31.5 pg (ref 26.0–34.0)
MCHC: 33.8 g/dL (ref 30.0–36.0)
MCV: 93.1 fL (ref 78.0–100.0)
Platelets: 195 10*3/uL (ref 150–400)
RBC: 4.22 MIL/uL (ref 4.22–5.81)
RDW: 12.6 % (ref 11.5–15.5)
WBC: 6.8 10*3/uL (ref 4.0–10.5)

## 2015-11-08 LAB — GLUCOSE, CAPILLARY
GLUCOSE-CAPILLARY: 117 mg/dL — AB (ref 65–99)
Glucose-Capillary: 107 mg/dL — ABNORMAL HIGH (ref 65–99)

## 2015-11-08 NOTE — Progress Notes (Signed)
[facility] Progress Note     Subjective: Pt doing great.  No N/V, minimal abdominal pain/distension.  Had 3 good BM's.  Ambulating well.  Wants to go home later today if tolerating diet.    Objective: Vital signs in last 24 hours: Temp:  [98.4 F (36.9 C)-98.8 F (37.1 C)] 98.4 F (36.9 C) (12/23 0646) Pulse Rate:  [72-79] 79 (12/23 0646) Resp:  [16-20] 20 (12/23 0646) BP: (138-169)/(81-96) 169/96 mmHg (12/23 0646) SpO2:  [95 %-97 %] 96 % (12/23 0646) Last BM Date: 11/07/15  Intake/Output from previous day: 12/22 0701 - 12/23 0700 In: 2400 [I.V.:2400] Out: 450 [Urine:450] Intake/Output this shift:    PE: Gen:  Alert, NAD, pleasant Abd: Obese, soft, minimal distension, NT, +BS, no HSM, no abdominal scars noted   Lab Results:   Recent Labs  11/07/15 0525 11/08/15 0605  WBC 6.3 6.8  HGB 12.9* 13.3  HCT 38.8* 39.3  PLT 199 195   BMET  Recent Labs  11/07/15 0525 11/08/15 0605  NA 138 139  K 3.6 3.5  CL 105 105  CO2 24 26  GLUCOSE 149* 105*  BUN 9 7  CREATININE 1.00 0.84  CALCIUM 8.5* 8.6*   PT/INR No results for input(s): LABPROT, INR in the last 72 hours. CMP     Component Value Date/Time   NA 139 11/08/2015 0605   K 3.5 11/08/2015 0605   CL 105 11/08/2015 0605   CO2 26 11/08/2015 0605   GLUCOSE 105* 11/08/2015 0605   GLUCOSE 120* 11/05/2006 1123   BUN 7 11/08/2015 0605   CREATININE 0.84 11/08/2015 0605   CALCIUM 8.6* 11/08/2015 0605   PROT 6.7 04/11/2014 0806   ALBUMIN 3.8 04/11/2014 0806   AST 24 04/11/2014 0806   ALT 24 04/11/2014 0806   ALKPHOS 41 04/11/2014 0806   BILITOT 0.8 04/11/2014 0806   GFRNONAA >60 11/08/2015 0605   GFRAA >60 11/08/2015 0605   Lipase     Component Value Date/Time   LIPASE 32 11/05/2015 2325       Studies/Results: Dg Abd 1 View  11/06/2015  CLINICAL DATA:  Small bowel obstruction. EXAM: ABDOMEN - 1 VIEW COMPARISON:  CT abdomen pelvis 11/06/2015. FINDINGS: There is mild gaseous distension  of small bowel in the central abdomen. Oral contrast and scattered gas in nondistended colon. No unexpected radiopaque calculi. Contrast is seen in the bladder. Left hip arthroplasty. IMPRESSION: Mild residual small bowel dilatation, indicative of a partial or resolving small bowel obstruction. Electronically Signed   By: Lorin Picket M.D.   On: 11/06/2015 08:23   Dg Abd Portable 1v  11/07/2015  CLINICAL DATA:  Small bowel obstruction protocol. EXAM: PORTABLE ABDOMEN - 1 VIEW COMPARISON:  11/07/2015 at 10:13 a.m. FINDINGS: Contrast is seen within a nondilated colon. There is no small bowel contrast. There is also no significant small bowel dilation. Findings are similar to the earlier study. IMPRESSION: No evidence of bowel obstruction. Contrast is seen throughout a nondistended colon. Electronically Signed   By: Lajean Manes M.D.   On: 11/07/2015 20:35   Dg Abd Portable 1v  11/07/2015  CLINICAL DATA:  Small bowel obstruction. EXAM: PORTABLE ABDOMEN - 1 VIEW COMPARISON:  11/06/2015 FINDINGS: Mildly prominent but nondilated loops of small bowel in the mid abdomen. Residual contrast in the transverse/left colon. Mild degenerative changes of the lower lumbar spine. Left hip arthroplasty. IMPRESSION: No evidence of bowel obstruction. Residual contrast in the transverse/left colon. Electronically Signed   By: Julian Hy  M.D.   On: 11/07/2015 11:30    Anti-infectives: Anti-infectives    None       Assessment/Plan PSBO, no h/o surgery -Xrays shows no further bowel obstruction, start clears, advance to soft if tolerating -IVF, pain control, antiemetics -Ambulate and IS -SCD's and lovenox -D/c home later this afternoon if tolerating soft diet -Discussed soft/puree diet at home for 1 week.  May need stool softeners.  Stay hydrated.  Ambulate frequently.  Eat smaller more frequent meals.    LOS: 2 days    [name] 11/08/2015, 7:33 AM Pager: [phone]

## 2015-11-08 NOTE — Discharge Summary (Signed)
Physician Discharge Summary  [name] [id number] DOB: 08/17/1935 DOA: 11/05/2015  PCP: Walker Kehr, MD  Admit date: 11/05/2015 Discharge date: 11/08/2015  Time spent: 35 minutes  Recommendations for Outpatient Follow-up:  1. Please follow-up on abdominal symptoms, patient was admitted for small bowel obstruction   Discharge Diagnoses:  Active Problems:   Diabetes mellitus, type 2 ([place])   Essential hypertension   Afib (HCC)   CAD (coronary artery disease), native coronary artery   Small bowel obstruction (HCC)   Diabetes mellitus with complication [facility])   Discharge Condition: Stable  Diet recommendation: Soft diet  Filed Weights   11/05/15 2312  Weight: 99.791 kg (220 lb)    History of present illness:  [name] is a 80 y.o. male who developed acute periumbilical abdominal pain and vomiting yesterday after eating. No alleviating factors. He had no associated diarrhea. In fact patient has been having constipation recently. He is taking stool softeners  Hospital Course:  [name] is a pleasant 80 year old gentleman with a past medical history of hypertension and diabetes mellitus who was admitted to the medicine service on 11/06/2015 19 with complaints of periumbilical pain social with nausea vomiting. He was worked up with a CT scan of abdomen and pelvis with IV contrast that showed partial or early small bowel obstruction. General surgery was consulted. He was made nothing by mouth, started on IV fluids.    Partial Small Bowel Obstruction. -Patient presenting with complaints of abdominal pain associated with nausea and vomiting. CT scan in the emergency department showed findings suggestive of partial small bowel obstruction. -He was made nothing by mouth. Surgery consulted. -This morning he did not have further episodes of nausea vomiting. NG tube not placed. -Abdominal x-ray done on 11/07/2015 showing no evidence of bowel obstruction.   -Patient tolerated advancement of diet, was discharged in stable condition on 11/08/2015  2. Type 2 diabetes mellitus -Blood sugars controlled  -Discharged on metformin 500 mg by mouth 3 times a day  3. Hypertension  -Was discharged on amlodipine 5 mg by mouth daily and metoprolol 25 mg by mouth daily. Please follow-up on blood pressures.  Consultations:  General surgery  Discharge Exam: Filed Vitals:   11/07/15 2217 11/08/15 0646  BP: 155/81 169/96  Pulse: 72 79  Temp: 98.8 F (37.1 C) 98.4 F (36.9 C)  Resp: 19 20     General: patient is awake alert, no acute distress sitting comfortably , is ready to go home today.  Cardiovascular: Regular rate and rhythm normal S1-S2 no murmurs rubs gallops   Respiratory: clear to auscultation bilaterally   Abdomen: His abdomen was soft, nontender nondistended  Musculoskeletal: No edema  Discharge Instructions   Discharge Instructions    Call MD for:  difficulty breathing, headache or visual disturbances    Complete by:  As directed      Call MD for:  extreme fatigue    Complete by:  As directed      Call MD for:  hives    Complete by:  As directed      Call MD for:  persistant dizziness or light-headedness    Complete by:  As directed      Call MD for:  persistant nausea and vomiting    Complete by:  As directed      Call MD for:  redness, tenderness, or signs of infection (pain, swelling, redness, odor or green/yellow discharge around incision site)    Complete by:  As directed  Call MD for:  severe uncontrolled pain    Complete by:  As directed      Call MD for:  temperature >100.4    Complete by:  As directed      Call MD for:    Complete by:  As directed      Diet - low sodium heart healthy    Complete by:  As directed      Increase activity slowly    Complete by:  As directed           Current Discharge Medication List    CONTINUE these medications which have NOT CHANGED   Details  amLODipine  (NORVASC) 5 MG tablet Take 1 tablet (5 mg total) by mouth daily. Qty: 30 tablet, Refills: 10    aspirin 325 MG tablet Take 325 mg by mouth daily.    cholecalciferol (VITAMIN D) 1000 UNITS tablet Take 1,000 Units by mouth 2 (two) times daily.     metFORMIN (GLUCOPHAGE) 500 MG tablet Take 1 tablet (500 mg total) by mouth 3 (three) times daily. Qty: 90 tablet, Refills: 11    metoprolol tartrate (LOPRESSOR) 25 MG tablet Take 1 tablet (25 mg total) by mouth daily. Qty: 30 tablet, Refills: 10    simvastatin (ZOCOR) 20 MG tablet Take 1 tablet (20 mg total) by mouth daily at 6 PM. Qty: 30 tablet, Refills: 6    docusate sodium (COLACE) 100 MG capsule Take 1 capsule (100 mg total) by mouth 3 (three) times daily as needed for mild constipation. Qty: 30 capsule, Refills: 0    HYDROcodone-acetaminophen (NORCO) 10-325 MG per tablet Take 1 tablet by mouth every 6 (six) hours as needed. Qty: 60 tablet, Refills: 0    methocarbamol (ROBAXIN) 500 MG tablet Take 1 tablet (500 mg total) by mouth 3 (three) times daily as needed for muscle spasms. Qty: 60 tablet, Refills: 0    ondansetron (ZOFRAN) 4 MG tablet Take 1 tablet (4 mg total) by mouth every 8 (eight) hours as needed for nausea or vomiting. Qty: 20 tablet, Refills: 0    polyethylene glycol powder (GLYCOLAX) powder Take 17 g by mouth daily. Qty: 255 g, Refills: 1    sodium chloride (OCEAN) 0.65 % SOLN nasal spray Place 1 spray into both nostrils as needed for congestion.       No Known Allergies Follow-up Information    Follow up with Walker Kehr, MD In 2 weeks.   Specialty:  Internal Medicine   Contact information:   [place] [address] [phone]        The results of significant diagnostics from this hospitalization (including imaging, microbiology, ancillary and laboratory) are listed below for reference.    Significant Diagnostic Studies: Dg Abd 1 View  11/06/2015  CLINICAL DATA:  Small bowel  obstruction. EXAM: ABDOMEN - 1 VIEW COMPARISON:  CT abdomen pelvis 11/06/2015. FINDINGS: There is mild gaseous distension of small bowel in the central abdomen. Oral contrast and scattered gas in nondistended colon. No unexpected radiopaque calculi. Contrast is seen in the bladder. Left hip arthroplasty. IMPRESSION: Mild residual small bowel dilatation, indicative of a partial or resolving small bowel obstruction. Electronically Signed   By: Lorin Picket M.D.   On: 11/06/2015 08:23   Ct Abdomen Pelvis W Contrast  11/06/2015  CLINICAL DATA:  Epigastric and mid abdominal pain. EXAM: CT ABDOMEN AND PELVIS WITH CONTRAST TECHNIQUE: Multidetector CT imaging of the abdomen and pelvis was performed using the standard protocol following bolus  administration of intravenous contrast. CONTRAST:  146mL OMNIPAQUE IOHEXOL 300 MG/ML  SOLN COMPARISON:  None available FINDINGS: Lower chest and abdominal wall:  Extensive coronary atherosclerosis. Poor esophageal clearance or gastroesophageal reflux Hepatobiliary: No focal liver abnormality.No evidence of biliary obstruction or stone. Pancreas: Unremarkable. Spleen: Unremarkable. Adrenals/Urinary Tract: Negative adrenals. No hydronephrosis or stone. Borderline bladder wall thickening. Reproductive:Negative for age Stomach/Bowel: Proximal small bowel is full of fluid and gas with focal transition point in the superficial right abdomen, best seen on coronal reformats image 25. There is no evidence of hernia, mass lesion, volvulus, or previous surgery in this region to explain the narrowing. Mild reactive appearing interloop fluid and mesenteric fat congestion. No evidence of bowel perforation. Vascular/Lymphatic: No acute vascular abnormality. No mass or adenopathy. Peritoneal: No ascites or pneumoperitoneum. Musculoskeletal: Multilevel disc and facet degeneration with L4-5 laminectomy defect. Left hip arthroplasty. No acute finding. IMPRESSION: Partial or early small bowel  obstruction with transition point in the right abdomen. No underlying cause identified. Electronically Signed   By: Monte Fantasia M.D.   On: 11/06/2015 03:19   Dg Abd Portable 1v  11/07/2015  CLINICAL DATA:  Small bowel obstruction protocol. EXAM: PORTABLE ABDOMEN - 1 VIEW COMPARISON:  11/07/2015 at 10:13 a.m. FINDINGS: Contrast is seen within a nondilated colon. There is no small bowel contrast. There is also no significant small bowel dilation. Findings are similar to the earlier study. IMPRESSION: No evidence of bowel obstruction. Contrast is seen throughout a nondistended colon. Electronically Signed   By: Lajean Manes M.D.   On: 11/07/2015 20:35   Dg Abd Portable 1v  11/07/2015  CLINICAL DATA:  Small bowel obstruction. EXAM: PORTABLE ABDOMEN - 1 VIEW COMPARISON:  11/06/2015 FINDINGS: Mildly prominent but nondilated loops of small bowel in the mid abdomen. Residual contrast in the transverse/left colon. Mild degenerative changes of the lower lumbar spine. Left hip arthroplasty. IMPRESSION: No evidence of bowel obstruction. Residual contrast in the transverse/left colon. Electronically Signed   By: Julian Hy M.D.   On: 11/07/2015 11:30    Microbiology: No results found for this or any previous visit (from the past 240 hour(s)).   Labs: Basic Metabolic Panel:  Recent Labs Lab 11/05/15 2325 11/07/15 0525 11/08/15 0605  NA 137 138 139  K 3.9 3.6 3.5  CL 101 105 105  CO2 28 24 26   GLUCOSE 195* 149* 105*  BUN 18 9 7   CREATININE 0.93 1.00 0.84  CALCIUM 9.2 8.5* 8.6*   Liver Function Tests: No results for input(s): AST, ALT, ALKPHOS, BILITOT, PROT, ALBUMIN in the last 168 hours.  Recent Labs Lab 11/05/15 2325  LIPASE 32   No results for input(s): AMMONIA in the last 168 hours. CBC:  Recent Labs Lab 11/05/15 2325 11/07/15 0525 11/08/15 0605  WBC 12.6* 6.3 6.8  NEUTROABS 10.5*  --   --   HGB 14.4 12.9* 13.3  HCT 42.9 38.8* 39.3  MCV 92.1 94.2 93.1  PLT 246 199  195   Cardiac Enzymes: No results for input(s): CKTOTAL, CKMB, CKMBINDEX, TROPONINI in the last 168 hours. BNP: BNP (last 3 results) No results for input(s): BNP in the last 8760 hours.  ProBNP (last 3 results) No results for input(s): PROBNP in the last 8760 hours.  CBG:  Recent Labs Lab 11/07/15 0722 11/07/15 1123 11/07/15 1701 11/07/15 2215 11/08/15 0753  GLUCAP 135* 126* 111* 95 107*       Signed:  Kelvin Cellar MD  FACP  [facility] 11/08/2015, 11:11 AM

## 2015-11-20 ENCOUNTER — Ambulatory Visit (INDEPENDENT_AMBULATORY_CARE_PROVIDER_SITE_OTHER): Payer: Medicare Other | Admitting: Internal Medicine

## 2015-11-20 ENCOUNTER — Encounter: Payer: Self-pay | Admitting: Internal Medicine

## 2015-11-20 VITALS — BP 120/74 | HR 69 | Wt 223.0 lb

## 2015-11-20 DIAGNOSIS — E1159 Type 2 diabetes mellitus with other circulatory complications: Secondary | ICD-10-CM | POA: Diagnosis not present

## 2015-11-20 DIAGNOSIS — K56609 Unspecified intestinal obstruction, unspecified as to partial versus complete obstruction: Secondary | ICD-10-CM

## 2015-11-20 DIAGNOSIS — Z09 Encounter for follow-up examination after completed treatment for conditions other than malignant neoplasm: Secondary | ICD-10-CM

## 2015-11-20 DIAGNOSIS — I2510 Atherosclerotic heart disease of native coronary artery without angina pectoris: Secondary | ICD-10-CM

## 2015-11-20 DIAGNOSIS — I10 Essential (primary) hypertension: Secondary | ICD-10-CM | POA: Diagnosis not present

## 2015-11-20 MED ORDER — METFORMIN HCL 500 MG PO TABS: 500.0000 mg | ORAL_TABLET | Freq: Three times a day (TID) | ORAL | Status: DC

## 2015-11-20 NOTE — Assessment & Plan Note (Signed)
12/16 partial SBO, resolved

## 2015-11-20 NOTE — Assessment & Plan Note (Signed)
On Metformin  BMET, A1c q 3 mo

## 2015-11-20 NOTE — Assessment & Plan Note (Signed)
On ASA, Metoprolol, Simvastatin and Norvasc 

## 2015-11-20 NOTE — Progress Notes (Signed)
Pre visit review using our clinic review tool, if applicable. No additional management support is needed unless otherwise documented below in the visit note. 

## 2015-11-20 NOTE — Progress Notes (Signed)
Subjective:  Patient ID: [name], male    DOB: 10/11/1935  Age: 81 y.o. MRN: [id number]  CC: No chief complaint on file.   HPI [name] presents for DM2, CAD, HTN f/u  F/u recent hosp stay for partial SBO: "  Discharge Diagnoses:  Active Problems:  Diabetes mellitus, type 2 ([place])  Essential hypertension  Afib (HCC)  CAD (coronary artery disease), native coronary artery  Small bowel obstruction (HCC)  Diabetes mellitus with complication [facility])   Discharge Condition: Stable  Diet recommendation: Soft diet  Filed Weights   11/05/15 2312  Weight: 99.791 kg (220 lb)    History of present illness:  [name] is a 81 y.o. male who developed acute periumbilical abdominal pain and vomiting yesterday after eating. No alleviating factors. He had no associated diarrhea. In fact patient has been having constipation recently. He is taking stool softeners  Hospital Course:  [name] with a past medical history of hypertension and diabetes mellitus who was admitted to the medicine service on 11/06/2015 19 with complaints of periumbilical pain social with nausea vomiting. He was worked up with a CT scan of abdomen and pelvis with IV contrast that showed partial or early small bowel obstruction. General surgery was consulted. He was made nothing by mouth, started on IV fluids.   Partial Small Bowel Obstruction. -Patient presenting with complaints of abdominal pain associated with nausea and vomiting. CT scan in the emergency department showed findings suggestive of partial small bowel obstruction. -He was made nothing by mouth. Surgery consulted. -This morning he did not have further episodes of nausea vomiting. NG tube not placed. -Abdominal x-ray done on 11/07/2015 showing no evidence of bowel obstruction.  -Patient tolerated advancement of diet, was discharged in stable condition on  11/08/2015  2. Type 2 diabetes mellitus -Blood sugars controlled  -Discharged on metformin 500 mg by mouth 3 times a day  3. Hypertension  -Was discharged on amlodipine 5 mg by mouth daily and metoprolol 25 mg by mouth daily. Please follow-up on blood pressures.  Consultations:  General surgery"        Outpatient Prescriptions Prior to Visit  Medication Sig Dispense Refill  . amLODipine (NORVASC) 5 MG tablet Take 1 tablet (5 mg total) by mouth daily. 30 tablet 10  . aspirin 325 MG tablet Take 325 mg by mouth daily.    . cholecalciferol (VITAMIN D) 1000 UNITS tablet Take 1,000 Units by mouth 2 (two) times daily.     Marland Kitchen docusate sodium (COLACE) 100 MG capsule Take 1 capsule (100 mg total) by mouth 3 (three) times daily as needed for mild constipation. 30 capsule 0  . metoprolol tartrate (LOPRESSOR) 25 MG tablet Take 1 tablet (25 mg total) by mouth daily. 30 tablet 10  . polyethylene glycol powder (GLYCOLAX) powder Take 17 g by mouth daily. 255 g 1  . simvastatin (ZOCOR) 20 MG tablet Take 1 tablet (20 mg total) by mouth daily at 6 PM. (Patient taking differently: Take 20 mg by mouth every evening. ) 30 tablet 6  . metFORMIN (GLUCOPHAGE) 500 MG tablet Take 1 tablet (500 mg total) by mouth 3 (three) times daily. 90 tablet 11  . HYDROcodone-acetaminophen (NORCO) 10-325 MG per tablet Take 1 tablet by mouth every 6 (six) hours as needed. (Patient not taking: Reported on 11/20/2015) 60 tablet 0  . methocarbamol (ROBAXIN) 500 MG tablet Take 1 tablet (500 mg total) by mouth 3 (three) times daily  as needed for muscle spasms. (Patient not taking: Reported on 11/20/2015) 60 tablet 0  . ondansetron (ZOFRAN) 4 MG tablet Take 1 tablet (4 mg total) by mouth every 8 (eight) hours as needed for nausea or vomiting. (Patient not taking: Reported on 11/20/2015) 20 tablet 0  . sodium chloride (OCEAN) 0.65 % SOLN nasal spray Place 1 spray into both nostrils as needed for congestion. Reported on 11/20/2015     No  facility-administered medications prior to visit.    ROS Review of Systems  Constitutional: Negative for appetite change, fatigue and unexpected weight change.  HENT: Negative for congestion, nosebleeds, sneezing, sore throat and trouble swallowing.   Eyes: Negative for itching and visual disturbance.  Respiratory: Negative for cough.   Cardiovascular: Negative for chest pain, palpitations and leg swelling.  Gastrointestinal: Positive for constipation. Negative for nausea, vomiting, abdominal pain, diarrhea, blood in stool and abdominal distention.  Genitourinary: Negative for frequency and hematuria.  Musculoskeletal: Negative for back pain, joint swelling, gait problem and neck pain.  Skin: Negative for rash.  Neurological: Negative for dizziness, tremors, speech difficulty and weakness.  Psychiatric/Behavioral: Negative for suicidal ideas, sleep disturbance, dysphoric mood, decreased concentration and agitation. The patient is not nervous/anxious.     Objective:  BP 120/74 mmHg  Pulse 69  Wt 223 lb (101.152 kg)  SpO2 97%  BP Readings from Last 3 Encounters:  11/20/15 120/74  11/08/15 142/74  08/20/15 122/64    Wt Readings from Last 3 Encounters:  11/20/15 223 lb (101.152 kg)  11/05/15 220 lb (99.791 kg)  08/20/15 228 lb (103.42 kg)    Physical Exam  Constitutional: He is oriented to person, place, and time. He appears well-developed. No distress.  NAD  HENT:  Mouth/Throat: Oropharynx is clear and moist.  Eyes: Conjunctivae are normal. Pupils are equal, round, and reactive to light.  Neck: Normal range of motion. No JVD present. No thyromegaly present.  Cardiovascular: Normal rate, regular rhythm, normal heart sounds and intact distal pulses.  Exam reveals no gallop and no friction rub.   No murmur heard. Pulmonary/Chest: Effort normal and breath sounds normal. No respiratory distress. He has no wheezes. He has no rales. He exhibits no tenderness.  Abdominal: Soft.  Bowel sounds are normal. He exhibits no distension and no mass. There is no tenderness. There is no rebound and no guarding.  Musculoskeletal: Normal range of motion. He exhibits no edema or tenderness.  Lymphadenopathy:    He has no cervical adenopathy.  Neurological: He is alert and oriented to person, place, and time. He has normal reflexes. No cranial nerve deficit. He exhibits normal muscle tone. He displays a negative Romberg sign. Coordination and gait normal.  Skin: Skin is warm and dry. No rash noted.  Psychiatric: He has a normal mood and affect. His behavior is normal. Judgment and thought content normal.    Lab Results  Component Value Date   WBC 6.8 11/08/2015   HGB 13.3 11/08/2015   HCT 39.3 11/08/2015   PLT 195 11/08/2015   GLUCOSE 105* 11/08/2015   CHOL 95 04/11/2014   TRIG 78.0 04/11/2014   HDL 38.30* 04/11/2014   LDLCALC 41 04/11/2014   ALT 24 04/11/2014   AST 24 04/11/2014   NA 139 11/08/2015   K 3.5 11/08/2015   CL 105 11/08/2015   CREATININE 0.84 11/08/2015   BUN 7 11/08/2015   CO2 26 11/08/2015   TSH 1.41 01/03/2013   PSA 1.55 12/26/2010   INR 2.3 03/03/2012  HGBA1C 6.9* 08/19/2015    Dg Abd 1 View  11/06/2015  CLINICAL DATA:  Small bowel obstruction. EXAM: ABDOMEN - 1 VIEW COMPARISON:  CT abdomen pelvis 11/06/2015. FINDINGS: There is mild gaseous distension of small bowel in the central abdomen. Oral contrast and scattered gas in nondistended colon. No unexpected radiopaque calculi. Contrast is seen in the bladder. Left hip arthroplasty. IMPRESSION: Mild residual small bowel dilatation, indicative of a partial or resolving small bowel obstruction. Electronically Signed   By: Lorin Picket M.D.   On: 11/06/2015 08:23   Ct Abdomen Pelvis W Contrast  11/06/2015  CLINICAL DATA:  Epigastric and mid abdominal pain. EXAM: CT ABDOMEN AND PELVIS WITH CONTRAST TECHNIQUE: Multidetector CT imaging of the abdomen and pelvis was performed using the standard  protocol following bolus administration of intravenous contrast. CONTRAST:  127mL OMNIPAQUE IOHEXOL 300 MG/ML  SOLN COMPARISON:  None available FINDINGS: Lower chest and abdominal wall:  Extensive coronary atherosclerosis. Poor esophageal clearance or gastroesophageal reflux Hepatobiliary: No focal liver abnormality.No evidence of biliary obstruction or stone. Pancreas: Unremarkable. Spleen: Unremarkable. Adrenals/Urinary Tract: Negative adrenals. No hydronephrosis or stone. Borderline bladder wall thickening. Reproductive:Negative for age Stomach/Bowel: Proximal small bowel is full of fluid and gas with focal transition point in the superficial right abdomen, best seen on coronal reformats image 25. There is no evidence of hernia, mass lesion, volvulus, or previous surgery in this region to explain the narrowing. Mild reactive appearing interloop fluid and mesenteric fat congestion. No evidence of bowel perforation. Vascular/Lymphatic: No acute vascular abnormality. No mass or adenopathy. Peritoneal: No ascites or pneumoperitoneum. Musculoskeletal: Multilevel disc and facet degeneration with L4-5 laminectomy defect. Left hip arthroplasty. No acute finding. IMPRESSION: Partial or early small bowel obstruction with transition point in the right abdomen. No underlying cause identified. Electronically Signed   By: Monte Fantasia M.D.   On: 11/06/2015 03:19    Assessment & Plan:   Diagnoses and all orders for this visit:  Small bowel obstruction ([place])  Essential hypertension  Coronary artery disease involving native coronary artery of native heart without angina pectoris  Type 2 diabetes mellitus with other circulatory complication, without long-term current use of insulin (HCC)  Other orders -     metFORMIN (GLUCOPHAGE) 500 MG tablet; Take 1 tablet (500 mg total) by mouth 3 (three) times daily.  I am having John. Rabadi maintain his cholecalciferol, metoprolol tartrate, amLODipine, sodium chloride,  ondansetron, methocarbamol, polyethylene glycol powder, docusate sodium, HYDROcodone-acetaminophen, simvastatin, aspirin, and metFORMIN.  Meds ordered this encounter  Medications  . metFORMIN (GLUCOPHAGE) 500 MG tablet    Sig: Take 1 tablet (500 mg total) by mouth 3 (three) times daily.    Dispense:  90 tablet    Refill:  11     Follow-up: No Follow-up on file.  Walker Kehr, MD

## 2015-12-09 ENCOUNTER — Other Ambulatory Visit: Payer: Self-pay | Admitting: Internal Medicine

## 2015-12-23 ENCOUNTER — Ambulatory Visit: Payer: Medicare Other | Admitting: Internal Medicine

## 2016-01-13 ENCOUNTER — Other Ambulatory Visit: Payer: Self-pay | Admitting: *Deleted

## 2016-01-13 ENCOUNTER — Other Ambulatory Visit: Payer: Self-pay | Admitting: Internal Medicine

## 2016-01-13 MED ORDER — METOPROLOL TARTRATE 25 MG PO TABS: 25.0000 mg | ORAL_TABLET | Freq: Every day | ORAL | Status: DC

## 2016-01-16 NOTE — Progress Notes (Signed)
Cardiology Office Note   Date:  01/17/2016   ID:  [name], DOB [birth year], MRN [id number]  PCP:  Walker Kehr, MD  Cardiologist:   [name], MD    F/U of CAD    History of Present Illness: [name] is a 81 y.o. male with a history ofCAD, status post CABG 01/2011, diabetes, hypertension, nephrolithiasis, hyperlipidemia, postoperative atrial fibrillation. LHC 01/12/11 prior to his CABG demonstrated left main and severe double vessel CAD. EF 45%. He had no significant disease in the RCA. Grafts at the time of his CABG: LIMA-distal LAD, SVG-circumflex. He remained on Coumadin and amiodarone for some time postoperatively b/c of postoperative atrial fibrillation.  THe patient was last in clinic in March 2016     Pt says that he is gettting a little more SOB with activity  Slowing down as a trened   No Cp      Outpatient Prescriptions Prior to Visit  Medication Sig Dispense Refill  . amLODipine (NORVASC) 5 MG tablet Take 1 tablet (5 mg total) by mouth daily. 30 tablet 10  . aspirin 325 MG tablet Take 325 mg by mouth daily.    . cholecalciferol (VITAMIN D) 1000 UNITS tablet Take 1,000 Units by mouth 2 (two) times daily.     Marland Kitchen docusate sodium (COLACE) 100 MG capsule Take 1 capsule (100 mg total) by mouth 3 (three) times daily as needed for mild constipation. 30 capsule 0  . HYDROcodone-acetaminophen (NORCO) 10-325 MG per tablet Take 1 tablet by mouth every 6 (six) hours as needed. 60 tablet 0  . metFORMIN (GLUCOPHAGE) 500 MG tablet Take 1 tablet (500 mg total) by mouth 3 (three) times daily. 90 tablet 11  . methocarbamol (ROBAXIN) 500 MG tablet Take 1 tablet (500 mg total) by mouth 3 (three) times daily as needed for muscle spasms. 60 tablet 0  . metoprolol tartrate (LOPRESSOR) 25 MG tablet Take 1 tablet (25 mg total) by mouth daily. 30 tablet 11  . ondansetron (ZOFRAN) 4 MG tablet Take 1 tablet (4 mg total) by mouth every 8 (eight) hours as needed for nausea or  vomiting. 20 tablet 0  . polyethylene glycol powder (GLYCOLAX) powder Take 17 g by mouth daily. 255 g 1  . simvastatin (ZOCOR) 20 MG tablet TAKE 1/2 TABLET BY MOUTH EVERY DAY AT BEDTIME 45 tablet 1  . sodium chloride (OCEAN) 0.65 % SOLN nasal spray Place 1 spray into both nostrils as needed for congestion. Reported on 11/20/2015     No facility-administered medications prior to visit.     Allergies:   Review of patient's allergies indicates no known allergies.   Past Medical History  Diagnosis Date  . COLONIC POLYPS, HX OF 04/26/2007  . DIABETES MELLITUS, TYPE II 04/26/2007  . HYPERTENSION 04/26/2007  . NEPHROLITHIASIS, HX OF 04/26/2007  . Unspecified hearing loss 12/18/2008  . Coronary artery disease     s/p CABG 3/12: L-LAD, S-CFX (Dr. Roxy Manns); EF 45% at cath prior to CABG  . Hyperlipidemia   . Atrial fibrillation ([facility])     post op; amiodarone and coumadin continued for 3 mos post op  . Arthritis   . Cataract     beginning of cataracts  . Shortness of breath dyspnea     with exertion  . CARCINOMA, SKIN, SQUAMOUS CELL 01/07/2010  . SBO (small bowel obstruction) ([facility]) 10/2015    Partial     Past Surgical History  Procedure Laterality Date  . Tympanic membrane repair  1981  . Back surgery      disectomy  . Total hip arthroplasty Left     left on 04/15/07  . Colonoscopy    . Coronary artery bypass graft  01/12/2011  . Lumbar laminectomy/decompression microdiscectomy N/A 05/08/2015    Procedure: LUMBAR LAMINECTOMY/DECOMPRESSION MICRODISCECTOMY 1 LEVEL Lumbar four five;  Surgeon: Melina Schools, MD;  Location: [facility];  Service: Orthopedics;  Laterality: N/A;     Social History:  The patient  reports that he quit smoking about 35 years ago. He has never used smokeless tobacco. He reports that he does not drink alcohol or use illicit drugs.   Family History:  The patient's family history includes Breast cancer in his sister; Cancer in his brother and sister; Heart disease in his brother  and father; Heart failure in his mother. There is no history of Colon cancer.    ROS:  Please see the history of present illness. All other systems are reviewed and  Negative to the above problem except as noted.    PHYSICAL EXAM: VS:  BP 126/70 mmHg  Pulse 66  Ht 5\' 8"  (1.727 m)  Wt 229 lb 9.6 oz (104.146 kg)  BMI 34.92 kg/m2  GEN: Well nourished, well developed, in no acute distress HEENT: normal Neck: no JVD, carotid bruits, or masses Cardiac: RRR; no murmurs, rubs, or gallops,no edema  Respiratory:  clear to auscultation bilaterally, normal work of breathing GI: soft, nontender, nondistended, + BS  No hepatomegaly  MS: no deformity Moving all extremities   Skin: warm and dry, no rash Neuro:  Strength and sensation are intact Psych: euthymic mood, full affect   EKG:  EKG is ordered today.  SR 66  First degree AV block  PR 244 msec    Lipid Panel    Component Value Date/Time   CHOL 95 04/11/2014 0806   TRIG 78.0 04/11/2014 0806   TRIG 78 11/05/2006 1123   HDL 38.30* 04/11/2014 0806   CHOLHDL 2 04/11/2014 0806   CHOLHDL 3.6 CALC 11/05/2006 1123   VLDL 15.6 04/11/2014 0806   LDLCALC 41 04/11/2014 0806      Wt Readings from Last 3 Encounters:  01/17/16 229 lb 9.6 oz (104.146 kg)  11/20/15 223 lb (101.152 kg)  11/05/15 220 lb (99.791 kg)      ASSESSMENT AND PLAN:  1.  CAD  I am not convinced of active angina but concerned about fatigue, giving out  I would recomm checking TSH and set up for stress myoview    2.  HL  Continue Zocor  Check liipds    F U will be based on test resuts  Tentiatively in 1 year.     Orders Placed This Encounter  Procedures  . TSH  . Lipid panel  . Myocardial Perfusion Imaging  . EKG 12-Lead     Dispos  Signed, [name], MD  01/17/2016 1:39 PM    [facility] [address], [place], [place]  [address] Phone: [phone]; Fax: [phone]

## 2016-01-17 ENCOUNTER — Encounter: Payer: Self-pay | Admitting: Internal Medicine

## 2016-01-17 ENCOUNTER — Ambulatory Visit (INDEPENDENT_AMBULATORY_CARE_PROVIDER_SITE_OTHER): Payer: Medicare Other | Admitting: Internal Medicine

## 2016-01-17 VITALS — BP 126/70 | HR 66 | Ht 68.0 in | Wt 229.6 lb

## 2016-01-17 DIAGNOSIS — R0602 Shortness of breath: Secondary | ICD-10-CM

## 2016-01-17 DIAGNOSIS — I2510 Atherosclerotic heart disease of native coronary artery without angina pectoris: Secondary | ICD-10-CM | POA: Diagnosis not present

## 2016-01-17 DIAGNOSIS — E785 Hyperlipidemia, unspecified: Secondary | ICD-10-CM | POA: Diagnosis not present

## 2016-01-17 LAB — LIPID PANEL
CHOL/HDL RATIO: 2.5 ratio (ref <=5.0)
CHOLESTEROL: 116 mg/dL — AB (ref 125–200)
HDL: 47 mg/dL (ref >=40)
LDL Cholesterol: 49 mg/dL (ref <130)
Triglycerides: 101 mg/dL (ref <150)
VLDL: 20 mg/dL (ref <30)

## 2016-01-17 NOTE — Patient Instructions (Addendum)
Your physician recommends that you continue on your current medications as directed. Please refer to the Current Medication list given to you today.  Your physician recommends that you return for lab work in: today (TSH, LIPIDS)  Your physician has requested that you have en exercise stress myoview. For further information please visit [email or web address]. Please follow instruction sheet, as given.  Your physician wants you to follow-up in: 1 year with Dr. Harrington Challenger.  You will receive a reminder letter in the mail two months in advance. If you don't receive a letter, please call our office to schedule the follow-up appointment.

## 2016-01-18 LAB — TSH: TSH: 1.51 m[IU]/L (ref 0.40–4.50)

## 2016-01-23 ENCOUNTER — Telehealth (HOSPITAL_COMMUNITY): Payer: Self-pay | Admitting: Radiology

## 2016-01-23 NOTE — Telephone Encounter (Signed)
Patient given detailed instructions per Myocardial Perfusion Study Information Sheet for the test on 02/03/2016 at 9:45. Patient notified to arrive 15 minutes early and that it is imperative to arrive on time for appointment to keep from having the test rescheduled.  If you need to cancel or reschedule your appointment, please call the office within 24 hours of your appointment. Failure to do so may result in a cancellation of your appointment, and a $50 no show fee. Patient verbalized understanding.EK

## 2016-01-29 ENCOUNTER — Telehealth (HOSPITAL_COMMUNITY): Payer: Self-pay | Admitting: *Deleted

## 2016-01-29 NOTE — Telephone Encounter (Signed)
Left message on voicemail per DPR in reference to upcoming appointment scheduled on 02/03/16 with detailed instructions given per Myocardial Perfusion Study Information Sheet for the test. LM to arrive 15 minutes early, and that it is imperative to arrive on time for appointment to keep from having the test rescheduled. If you need to cancel or reschedule your appointment, please call the office within 24 hours of your appointment. Failure to do so may result in a cancellation of your appointment, and a $50 no show fee. Phone number given for call back for any questions. Hubbard Robinson, RN

## 2016-01-31 ENCOUNTER — Encounter (HOSPITAL_COMMUNITY): Payer: Medicare Other

## 2016-02-03 ENCOUNTER — Ambulatory Visit (HOSPITAL_COMMUNITY): Payer: Medicare Other | Attending: Cardiology

## 2016-02-03 DIAGNOSIS — I4891 Unspecified atrial fibrillation: Secondary | ICD-10-CM | POA: Diagnosis not present

## 2016-02-03 DIAGNOSIS — E785 Hyperlipidemia, unspecified: Secondary | ICD-10-CM

## 2016-02-03 DIAGNOSIS — I2510 Atherosclerotic heart disease of native coronary artery without angina pectoris: Secondary | ICD-10-CM

## 2016-02-03 DIAGNOSIS — R9439 Abnormal result of other cardiovascular function study: Secondary | ICD-10-CM | POA: Diagnosis not present

## 2016-02-03 DIAGNOSIS — I119 Hypertensive heart disease without heart failure: Secondary | ICD-10-CM | POA: Diagnosis not present

## 2016-02-03 DIAGNOSIS — R0602 Shortness of breath: Secondary | ICD-10-CM | POA: Diagnosis not present

## 2016-02-03 LAB — MYOCARDIAL PERFUSION IMAGING
CHL CUP NUCLEAR SRS: 7
CHL CUP NUCLEAR SSS: 7
CSEPED: 3 min
CSEPPHR: 137 {beats}/min
Estimated workload: 4.6 METS
Exercise duration (sec): 0 s
LV dias vol: 105 mL (ref 62–150)
LVSYSVOL: 59 mL
MPHR: 142 {beats}/min
Percent HR: 97 %
RATE: 0.33
RPE: 19
Rest HR: 68 {beats}/min
SDS: 0
TID: 0.98

## 2016-02-03 MED ORDER — TECHNETIUM TC 99M SESTAMIBI GENERIC - CARDIOLITE
10.8000 | Freq: Once | INTRAVENOUS | Status: AC | PRN
Start: 2016-02-03 — End: 2016-02-03
  Administered 2016-02-03: 11 via INTRAVENOUS

## 2016-02-03 MED ORDER — TECHNETIUM TC 99M SESTAMIBI GENERIC - CARDIOLITE
31.3000 | Freq: Once | INTRAVENOUS | Status: AC | PRN
  Administered 2016-02-03: 31.3 via INTRAVENOUS

## 2016-02-04 ENCOUNTER — Telehealth: Payer: Self-pay | Admitting: *Deleted

## 2016-02-04 DIAGNOSIS — I2510 Atherosclerotic heart disease of native coronary artery without angina pectoris: Secondary | ICD-10-CM

## 2016-02-04 NOTE — Telephone Encounter (Signed)
Notes Recorded by Rodman Key, RN on 02/04/2016 at 4:55 PM Informed patient. Advised that Dr. Harrington Challenger recommends echo to confirm pumping function. Advised that scheduler will call him to set up echo. Pt verbalizes understanding and agreement. Notes Recorded by Fay Records, MD on 02/03/2016 at 9:11 PM Stress test shows no evid for ischemia or inadequate blood flow to heart. I would recomm an echo to confirm pumping function of heart

## 2016-02-18 ENCOUNTER — Encounter: Payer: Self-pay | Admitting: Internal Medicine

## 2016-02-18 ENCOUNTER — Ambulatory Visit (INDEPENDENT_AMBULATORY_CARE_PROVIDER_SITE_OTHER): Payer: Medicare Other | Admitting: Internal Medicine

## 2016-02-18 VITALS — BP 120/64 | HR 68 | Wt 229.0 lb

## 2016-02-18 DIAGNOSIS — I10 Essential (primary) hypertension: Secondary | ICD-10-CM

## 2016-02-18 DIAGNOSIS — K5900 Constipation, unspecified: Secondary | ICD-10-CM | POA: Insufficient documentation

## 2016-02-18 DIAGNOSIS — K5669 Other intestinal obstruction: Secondary | ICD-10-CM

## 2016-02-18 DIAGNOSIS — Z23 Encounter for immunization: Secondary | ICD-10-CM | POA: Diagnosis not present

## 2016-02-18 DIAGNOSIS — K5901 Slow transit constipation: Secondary | ICD-10-CM | POA: Diagnosis not present

## 2016-02-18 DIAGNOSIS — E1159 Type 2 diabetes mellitus with other circulatory complications: Secondary | ICD-10-CM | POA: Diagnosis not present

## 2016-02-18 DIAGNOSIS — K56609 Unspecified intestinal obstruction, unspecified as to partial versus complete obstruction: Secondary | ICD-10-CM

## 2016-02-18 MED ORDER — LINACLOTIDE 145 MCG PO CAPS: 145.0000 ug | ORAL_CAPSULE | ORAL | Status: DC

## 2016-02-18 NOTE — Progress Notes (Signed)
Subjective:  Patient ID: [name], male    DOB: 08/10/1935  Age: 81 y.o. MRN: [id number]  CC: No chief complaint on file.   HPI [name] presents for HTN, OA/LBP, CAD f/u. C/o constipation - worse at times.  Outpatient Prescriptions Prior to Visit  Medication Sig Dispense Refill  . amLODipine (NORVASC) 5 MG tablet Take 1 tablet (5 mg total) by mouth daily. 30 tablet 10  . aspirin 325 MG tablet Take 325 mg by mouth daily.    . cholecalciferol (VITAMIN D) 1000 UNITS tablet Take 1,000 Units by mouth 2 (two) times daily.     Marland Kitchen docusate sodium (COLACE) 100 MG capsule Take 1 capsule (100 mg total) by mouth 3 (three) times daily as needed for mild constipation. (Patient not taking: Reported on 02/18/2016) 30 capsule 0  . HYDROcodone-acetaminophen (NORCO) 10-325 MG per tablet Take 1 tablet by mouth every 6 (six) hours as needed. (Patient not taking: Reported on 02/18/2016) 60 tablet 0  . metFORMIN (GLUCOPHAGE) 500 MG tablet Take 1 tablet (500 mg total) by mouth 3 (three) times daily. 90 tablet 11  . methocarbamol (ROBAXIN) 500 MG tablet Take 1 tablet (500 mg total) by mouth 3 (three) times daily as needed for muscle spasms. (Patient not taking: Reported on 02/18/2016) 60 tablet 0  . metoprolol tartrate (LOPRESSOR) 25 MG tablet Take 1 tablet (25 mg total) by mouth daily. 30 tablet 11  . ondansetron (ZOFRAN) 4 MG tablet Take 1 tablet (4 mg total) by mouth every 8 (eight) hours as needed for nausea or vomiting. (Patient not taking: Reported on 02/18/2016) 20 tablet 0  . polyethylene glycol powder (GLYCOLAX) powder Take 17 g by mouth daily. 255 g 1  . simvastatin (ZOCOR) 20 MG tablet TAKE 1/2 TABLET BY MOUTH EVERY DAY AT BEDTIME 45 tablet 1  . sodium chloride (OCEAN) 0.65 % SOLN nasal spray Place 1 spray into both nostrils as needed for congestion. Reported on 02/18/2016     No facility-administered medications prior to visit.    ROS Review of Systems  Constitutional: Negative for  appetite change, fatigue and unexpected weight change.  HENT: Negative for congestion, dental problem, nosebleeds, sneezing, sore throat and trouble swallowing.   Eyes: Negative for itching and visual disturbance.  Respiratory: Negative for cough.   Cardiovascular: Negative for chest pain, palpitations and leg swelling.  Gastrointestinal: Positive for constipation. Negative for nausea, vomiting, diarrhea, blood in stool, abdominal distention, anal bleeding and rectal pain.  Genitourinary: Negative for frequency and hematuria.  Musculoskeletal: Positive for back pain and arthralgias. Negative for joint swelling, gait problem and neck pain.  Skin: Negative for rash.  Neurological: Negative for dizziness, tremors, speech difficulty and weakness.  Psychiatric/Behavioral: Negative for sleep disturbance, dysphoric mood and agitation. The patient is not nervous/anxious.     Objective:  BP 120/64 mmHg  Pulse 68  Wt 229 lb (103.874 kg)  SpO2 90%  BP Readings from Last 3 Encounters:  02/18/16 120/64  01/17/16 126/70  11/20/15 120/74    Wt Readings from Last 3 Encounters:  02/18/16 229 lb (103.874 kg)  01/17/16 229 lb 9.6 oz (104.146 kg)  11/20/15 223 lb (101.152 kg)    Physical Exam  Constitutional: He is oriented to person, place, and time. He appears well-developed. No distress.  NAD  HENT:  Mouth/Throat: Oropharynx is clear and moist.  Eyes: Conjunctivae are normal. Pupils are equal, round, and reactive to light.  Neck: Normal range of motion. No JVD present. No  thyromegaly present.  Cardiovascular: Normal rate, regular rhythm, normal heart sounds and intact distal pulses.  Exam reveals no gallop and no friction rub.   No murmur heard. Pulmonary/Chest: Effort normal and breath sounds normal. No respiratory distress. He has no wheezes. He has no rales. He exhibits no tenderness.  Abdominal: Soft. Bowel sounds are normal. He exhibits no distension and no mass. There is no tenderness.  There is no rebound and no guarding.  Musculoskeletal: Normal range of motion. He exhibits no edema or tenderness.  Lymphadenopathy:    He has no cervical adenopathy.  Neurological: He is alert and oriented to person, place, and time. He has normal reflexes. No cranial nerve deficit. He exhibits normal muscle tone. He displays a negative Romberg sign. Coordination and gait normal.  Skin: Skin is warm and dry. No rash noted.  Psychiatric: He has a normal mood and affect. His behavior is normal. Judgment and thought content normal.  Obese  Lab Results  Component Value Date   WBC 6.8 11/08/2015   HGB 13.3 11/08/2015   HCT 39.3 11/08/2015   PLT 195 11/08/2015   GLUCOSE 105* 11/08/2015   CHOL 116* 01/17/2016   TRIG 101 01/17/2016   HDL 47 01/17/2016   LDLCALC 49 01/17/2016   ALT 24 04/11/2014   AST 24 04/11/2014   NA 139 11/08/2015   K 3.5 11/08/2015   CL 105 11/08/2015   CREATININE 0.84 11/08/2015   BUN 7 11/08/2015   CO2 26 11/08/2015   TSH 1.51 01/17/2016   PSA 1.55 12/26/2010   INR 2.3 03/03/2012   HGBA1C 6.9* 08/19/2015    Dg Abd 1 View  11/06/2015  CLINICAL DATA:  Small bowel obstruction. EXAM: ABDOMEN - 1 VIEW COMPARISON:  CT abdomen pelvis 11/06/2015. FINDINGS: There is mild gaseous distension of small bowel in the central abdomen. Oral contrast and scattered gas in nondistended colon. No unexpected radiopaque calculi. Contrast is seen in the bladder. Left hip arthroplasty. IMPRESSION: Mild residual small bowel dilatation, indicative of a partial or resolving small bowel obstruction. Electronically Signed   By: Lorin Picket M.D.   On: 11/06/2015 08:23   Ct Abdomen Pelvis W Contrast  11/06/2015  CLINICAL DATA:  Epigastric and mid abdominal pain. EXAM: CT ABDOMEN AND PELVIS WITH CONTRAST TECHNIQUE: Multidetector CT imaging of the abdomen and pelvis was performed using the standard protocol following bolus administration of intravenous contrast. CONTRAST:  182mL OMNIPAQUE  IOHEXOL 300 MG/ML  SOLN COMPARISON:  None available FINDINGS: Lower chest and abdominal wall:  Extensive coronary atherosclerosis. Poor esophageal clearance or gastroesophageal reflux Hepatobiliary: No focal liver abnormality.No evidence of biliary obstruction or stone. Pancreas: Unremarkable. Spleen: Unremarkable. Adrenals/Urinary Tract: Negative adrenals. No hydronephrosis or stone. Borderline bladder wall thickening. Reproductive:Negative for age Stomach/Bowel: Proximal small bowel is full of fluid and gas with focal transition point in the superficial right abdomen, best seen on coronal reformats image 25. There is no evidence of hernia, mass lesion, volvulus, or previous surgery in this region to explain the narrowing. Mild reactive appearing interloop fluid and mesenteric fat congestion. No evidence of bowel perforation. Vascular/Lymphatic: No acute vascular abnormality. No mass or adenopathy. Peritoneal: No ascites or pneumoperitoneum. Musculoskeletal: Multilevel disc and facet degeneration with L4-5 laminectomy defect. Left hip arthroplasty. No acute finding. IMPRESSION: Partial or early small bowel obstruction with transition point in the right abdomen. No underlying cause identified. Electronically Signed   By: Monte Fantasia M.D.   On: 11/06/2015 03:19    Assessment & Plan:  Diagnoses and all orders for this visit:  Essential hypertension -     Basic metabolic panel; Standing -     Hemoglobin A1c; Standing  Slow transit constipation  Type 2 diabetes mellitus with other circulatory complication, without long-term current use of insulin (HCC) -     Hemoglobin A1c; Standing  Small bowel obstruction (HCC)  Need for prophylactic vaccination against Streptococcus pneumoniae (pneumococcus) -     Pneumococcal polysaccharide vaccine 23-valent greater than or equal to 2yo subcutaneous/IM  Other orders -     Linaclotide (LINZESS) 145 MCG CAPS capsule; Take 1 capsule (145 mcg total) by mouth 1  day or 1 dose.  I am having [name] start on Linaclotide. I am also having him maintain his cholecalciferol, amLODipine, sodium chloride, ondansetron, methocarbamol, polyethylene glycol powder, docusate sodium, HYDROcodone-acetaminophen, aspirin, metFORMIN, simvastatin, and metoprolol tartrate.  Meds ordered this encounter  Medications  . Linaclotide (LINZESS) 145 MCG CAPS capsule    Sig: Take 1 capsule (145 mcg total) by mouth 1 day or 1 dose.    Dispense:  30 capsule    Refill:  11     Follow-up: Return in about 3 months (around 05/19/2016) for a follow-up visit.  Walker Kehr, MD

## 2016-02-18 NOTE — Assessment & Plan Note (Signed)
On Metformin Labs 

## 2016-02-18 NOTE — Assessment & Plan Note (Signed)
4/17 recurrent. H/o SBO 10/2015 - no relapse Linzess prn

## 2016-02-18 NOTE — Assessment & Plan Note (Signed)
On ASA, Metoprolol, Simvastatin and Norvasc 

## 2016-02-18 NOTE — Assessment & Plan Note (Signed)
No relapse 

## 2016-02-18 NOTE — Progress Notes (Signed)
Pre visit review using our clinic review tool, if applicable. No additional management support is needed unless otherwise documented below in the visit note. 

## 2016-02-19 ENCOUNTER — Encounter (HOSPITAL_COMMUNITY): Payer: Self-pay | Admitting: Emergency Medicine

## 2016-02-19 DIAGNOSIS — M1990 Unspecified osteoarthritis, unspecified site: Secondary | ICD-10-CM | POA: Diagnosis not present

## 2016-02-19 DIAGNOSIS — R109 Unspecified abdominal pain: Secondary | ICD-10-CM | POA: Diagnosis not present

## 2016-02-19 DIAGNOSIS — I2510 Atherosclerotic heart disease of native coronary artery without angina pectoris: Secondary | ICD-10-CM | POA: Insufficient documentation

## 2016-02-19 DIAGNOSIS — E119 Type 2 diabetes mellitus without complications: Secondary | ICD-10-CM | POA: Diagnosis not present

## 2016-02-19 DIAGNOSIS — H9190 Unspecified hearing loss, unspecified ear: Secondary | ICD-10-CM | POA: Insufficient documentation

## 2016-02-19 DIAGNOSIS — I4891 Unspecified atrial fibrillation: Secondary | ICD-10-CM | POA: Insufficient documentation

## 2016-02-19 DIAGNOSIS — Z79899 Other long term (current) drug therapy: Secondary | ICD-10-CM | POA: Diagnosis not present

## 2016-02-19 DIAGNOSIS — Z7982 Long term (current) use of aspirin: Secondary | ICD-10-CM | POA: Insufficient documentation

## 2016-02-19 DIAGNOSIS — Z951 Presence of aortocoronary bypass graft: Secondary | ICD-10-CM | POA: Diagnosis not present

## 2016-02-19 DIAGNOSIS — E785 Hyperlipidemia, unspecified: Secondary | ICD-10-CM | POA: Diagnosis not present

## 2016-02-19 DIAGNOSIS — I10 Essential (primary) hypertension: Secondary | ICD-10-CM | POA: Insufficient documentation

## 2016-02-19 DIAGNOSIS — Z87442 Personal history of urinary calculi: Secondary | ICD-10-CM | POA: Insufficient documentation

## 2016-02-19 DIAGNOSIS — Z7984 Long term (current) use of oral hypoglycemic drugs: Secondary | ICD-10-CM | POA: Diagnosis not present

## 2016-02-19 DIAGNOSIS — Z8719 Personal history of other diseases of the digestive system: Secondary | ICD-10-CM | POA: Diagnosis not present

## 2016-02-19 DIAGNOSIS — Z85828 Personal history of other malignant neoplasm of skin: Secondary | ICD-10-CM | POA: Insufficient documentation

## 2016-02-19 DIAGNOSIS — R1084 Generalized abdominal pain: Secondary | ICD-10-CM | POA: Diagnosis not present

## 2016-02-19 DIAGNOSIS — Z86010 Personal history of colonic polyps: Secondary | ICD-10-CM | POA: Diagnosis not present

## 2016-02-19 NOTE — ED Notes (Signed)
Pt. reports mid abdominal pain with mild nausea onset 2 days ago worse this evening , pt. Took laxative last BM this morning , denies emesis or diarrhea  , pt. is concerned about bowel obstruction which he had in the past . Denies fever or chills.

## 2016-02-20 ENCOUNTER — Emergency Department (HOSPITAL_COMMUNITY): Payer: Medicare Other

## 2016-02-20 ENCOUNTER — Emergency Department (HOSPITAL_COMMUNITY)
Admission: EM | Admit: 2016-02-20 | Discharge: 2016-02-20 | Disposition: A | Discharge status: Home / Self Care | Payer: Medicare Other | Attending: Emergency Medicine | Admitting: Emergency Medicine

## 2016-02-20 ENCOUNTER — Other Ambulatory Visit (HOSPITAL_COMMUNITY): Payer: Medicare Other

## 2016-02-20 DIAGNOSIS — R109 Unspecified abdominal pain: Secondary | ICD-10-CM | POA: Diagnosis not present

## 2016-02-20 DIAGNOSIS — R1084 Generalized abdominal pain: Secondary | ICD-10-CM

## 2016-02-20 LAB — COMPREHENSIVE METABOLIC PANEL
ALK PHOS: 36 U/L — AB (ref 38–126)
ALT: 20 U/L (ref 17–63)
ANION GAP: 11 (ref 5–15)
AST: 22 U/L (ref 15–41)
Albumin: 3.8 g/dL (ref 3.5–5.0)
BILIRUBIN TOTAL: 0.6 mg/dL (ref 0.3–1.2)
BUN: 14 mg/dL (ref 6–20)
CALCIUM: 9.7 mg/dL (ref 8.9–10.3)
CO2: 24 mmol/L (ref 22–32)
CREATININE: 0.9 mg/dL (ref 0.61–1.24)
Chloride: 104 mmol/L (ref 101–111)
Glucose, Bld: 143 mg/dL — ABNORMAL HIGH (ref 65–99)
Potassium: 3.7 mmol/L (ref 3.5–5.1)
Sodium: 139 mmol/L (ref 135–145)
TOTAL PROTEIN: 6.7 g/dL (ref 6.5–8.1)

## 2016-02-20 LAB — URINALYSIS, ROUTINE W REFLEX MICROSCOPIC
BILIRUBIN URINE: NEGATIVE
Glucose, UA: NEGATIVE mg/dL
Ketones, ur: NEGATIVE mg/dL
Leukocytes, UA: NEGATIVE
NITRITE: NEGATIVE
PROTEIN: NEGATIVE mg/dL
Specific Gravity, Urine: 1.017 (ref 1.005–1.030)
pH: 5.5 (ref 5.0–8.0)

## 2016-02-20 LAB — URINE MICROSCOPIC-ADD ON

## 2016-02-20 LAB — CBC
HCT: 39.6 % (ref 39.0–52.0)
Hemoglobin: 13.5 g/dL (ref 13.0–17.0)
MCH: 30.7 pg (ref 26.0–34.0)
MCHC: 34.1 g/dL (ref 30.0–36.0)
MCV: 90 fL (ref 78.0–100.0)
PLATELETS: 216 10*3/uL (ref 150–400)
RBC: 4.4 MIL/uL (ref 4.22–5.81)
RDW: 12.3 % (ref 11.5–15.5)
WBC: 7.3 10*3/uL (ref 4.0–10.5)

## 2016-02-20 LAB — LIPASE, BLOOD: Lipase: 37 U/L (ref 11–51)

## 2016-02-20 MED ORDER — IOPAMIDOL (ISOVUE-300) INJECTION 61%
INTRAVENOUS | Status: AC
  Administered 2016-02-20: 100 mL
  Filled 2016-02-20: qty 100

## 2016-02-20 NOTE — ED Notes (Signed)
TTS contacted again as to current wait time for TTS

## 2016-02-20 NOTE — ED Provider Notes (Signed)
Patient signed out to me pending CT of abdomen. He has history of small bowel obstruction and has had some similar symptoms to that. He has a nontender abdomen. CT done here shows no evidence of small bowel obstruction. The patient is not having pain or vomiting. He is hemodynamically stable for discharge. We have discussed return precautions that he voices understanding.  Pattricia Boss, MD 02/24/16 [phone]

## 2016-02-20 NOTE — Discharge Instructions (Signed)
Liquids only diet. Ducolax by mouth daily if no bowel movement. Follow up with your PCP as needed. Return to ER with any worsening of symptoms.   Abdominal Pain, Adult Many things can cause abdominal pain. Usually, abdominal pain is not caused by a disease and will improve without treatment. It can often be observed and treated at home. Your health care provider will do a physical exam and possibly order blood tests and X-rays to help determine the seriousness of your pain. However, in many cases, more time must pass before a clear cause of the pain can be found. Before that point, your health care provider may not know if you need more testing or further treatment. HOME CARE INSTRUCTIONS Monitor your abdominal pain for any changes. The following actions may help to alleviate any discomfort you are experiencing:  Only take over-the-counter or prescription medicines as directed by your health care provider.  Do not take laxatives unless directed to do so by your health care provider.  Try a clear liquid diet (broth, tea, or water) as directed by your health care provider. Slowly move to a bland diet as tolerated. SEEK MEDICAL CARE IF:  You have unexplained abdominal pain.  You have abdominal pain associated with nausea or diarrhea.  You have pain when you urinate or have a bowel movement.  You experience abdominal pain that wakes you in the night.  You have abdominal pain that is worsened or improved by eating food.  You have abdominal pain that is worsened with eating fatty foods.  You have a fever. SEEK IMMEDIATE MEDICAL CARE IF:  Your pain does not go away within 2 hours.  You keep throwing up (vomiting).  Your pain is felt only in portions of the abdomen, such as the right side or the left lower portion of the abdomen.  You pass bloody or black tarry stools. MAKE SURE YOU:  Understand these instructions.  Will watch your condition.  Will get help right away if you are  not doing well or get worse.   This information is not intended to replace advice given to you by your health care provider. Make sure you discuss any questions you have with your health care provider.   Document Released: 08/12/2005 Document Revised: 07/24/2015 Document Reviewed: 07/12/2013 Elsevier Interactive Patient Education [employer].

## 2016-02-20 NOTE — ED Provider Notes (Addendum)
CSN: [id number]     Arrival date & time 02/19/16  2247 History  By signing my name below, I, Evelene Croon, attest that this documentation has been prepared under the direction and in the presence of Tanna Furry, MD . Electronically Signed: Evelene Croon, Scribe. 02/20/2016. 3:27 AM.    Chief Complaint  Patient presents with  . Abdominal Pain    The history is provided by the patient. No language interpreter was used.    HPI Comments:  [name] is a 80 y.o. male who presents to the Emergency Department complaining of intermittent mid abdominal pain x a few days, worse after dinner last night ~ 1800. Pt states he had macaroni salad  and cabbage for dinner. Pt  reports h/o similar symptoms in Dec 2016 when he was diagnosed with a SBO. Pt is still able to pass gas. He denies vomiting, abdominal distention, and fever. No alleviating factors noted.   Past Medical History  Diagnosis Date  . COLONIC POLYPS, HX OF 04/26/2007  . DIABETES MELLITUS, TYPE II 04/26/2007  . HYPERTENSION 04/26/2007  . NEPHROLITHIASIS, HX OF 04/26/2007  . Unspecified hearing loss 12/18/2008  . Coronary artery disease     s/p CABG 3/12: L-LAD, S-CFX (Dr. Roxy Manns); EF 45% at cath prior to CABG  . Hyperlipidemia   . Atrial fibrillation ([place])     post op; amiodarone and coumadin continued for 3 mos post op  . Arthritis   . Cataract     beginning of cataracts  . Shortness of breath dyspnea     with exertion  . CARCINOMA, SKIN, SQUAMOUS CELL 01/07/2010  . SBO (small bowel obstruction) ([place]) 10/2015    Partial    Past Surgical History  Procedure Laterality Date  . Tympanic membrane repair  1981  . Back surgery      disectomy  . Total hip arthroplasty Left     left on 04/15/07  . Colonoscopy    . Coronary artery bypass graft  01/12/2011  . Lumbar laminectomy/decompression microdiscectomy N/A 05/08/2015    Procedure: LUMBAR LAMINECTOMY/DECOMPRESSION MICRODISCECTOMY 1 LEVEL Lumbar four five;  Surgeon: Melina Schools,  MD;  Location: [facility];  Service: Orthopedics;  Laterality: N/A;   Family History  Problem Relation Age of Onset  . Heart failure Mother   . Heart disease Father   . Cancer Sister   . Cancer Brother   . Heart disease Brother   . Breast cancer Sister   . Colon cancer Neg Hx    Social History  Substance Use Topics  . Smoking status: Former Smoker    Quit date: 01/02/1981  . Smokeless tobacco: Never Used  . Alcohol Use: No    Review of Systems  Constitutional: Negative for fever, chills, diaphoresis, appetite change and fatigue.  HENT: Negative for mouth sores, sore throat and trouble swallowing.   Eyes: Negative for visual disturbance.  Respiratory: Negative for cough, chest tightness, shortness of breath and wheezing.   Cardiovascular: Negative for chest pain.  Gastrointestinal: Positive for abdominal pain. Negative for nausea, vomiting, diarrhea and abdominal distention.  Endocrine: Negative for polydipsia, polyphagia and polyuria.  Genitourinary: Negative for dysuria, frequency and hematuria.  Musculoskeletal: Negative for gait problem.  Skin: Negative for color change, pallor and rash.  Neurological: Negative for dizziness, syncope, light-headedness and headaches.  Hematological: Does not bruise/bleed easily.  Psychiatric/Behavioral: Negative for behavioral problems and confusion.   Allergies  Review of patient's allergies indicates no known allergies.  Home Medications   Prior  to Admission medications   Medication Sig Start Date End Date Taking? Authorizing Provider  amLODipine (NORVASC) 5 MG tablet Take 1 tablet (5 mg total) by mouth daily. 03/25/15  Yes Fay Records, MD  aspirin 325 MG tablet Take 325 mg by mouth daily.   Yes Historical Provider, MD  cholecalciferol (VITAMIN D) 1000 UNITS tablet Take 1,000 Units by mouth 2 (two) times daily.    Yes Historical Provider, MD  metFORMIN (GLUCOPHAGE) 500 MG tablet Take 1 tablet (500 mg total) by mouth 3 (three) times daily.  11/20/15  Yes Aleksei [name] V, MD  metoprolol tartrate (LOPRESSOR) 25 MG tablet Take 1 tablet (25 mg total) by mouth daily. 01/13/16  Yes Fay Records, MD  simvastatin (ZOCOR) 20 MG tablet TAKE 1/2 TABLET BY MOUTH EVERY DAY AT BEDTIME 12/09/15  Yes Fay Records, MD  docusate sodium (COLACE) 100 MG capsule Take 1 capsule (100 mg total) by mouth 3 (three) times daily as needed for mild constipation. Patient not taking: Reported on 02/18/2016 05/09/15   Melina Schools, MD  HYDROcodone-acetaminophen [facility]) 10-325 MG per tablet Take 1 tablet by mouth every 6 (six) hours as needed. Patient not taking: Reported on 02/18/2016 05/09/15   Melina Schools, MD  Linaclotide [facility]) 145 MCG CAPS capsule Take 1 capsule (145 mcg total) by mouth 1 day or 1 dose. 02/18/16   Aleksei [name] V, MD  methocarbamol (ROBAXIN) 500 MG tablet Take 1 tablet (500 mg total) by mouth 3 (three) times daily as needed for muscle spasms. Patient not taking: Reported on 02/18/2016 05/09/15   Melina Schools, MD  ondansetron (ZOFRAN) 4 MG tablet Take 1 tablet (4 mg total) by mouth every 8 (eight) hours as needed for nausea or vomiting. Patient not taking: Reported on 02/18/2016 05/09/15   Melina Schools, MD  polyethylene glycol powder (GLYCOLAX) powder Take 17 g by mouth daily. Patient not taking: Reported on 02/20/2016 05/09/15   Melina Schools, MD   BP 138/69 mmHg  Pulse 73  Temp(Src) 98.2 F (36.8 C) (Oral)  Resp 24  Ht 5\' 8"  (1.727 m)  Wt 226 lb 7 oz (102.711 kg)  BMI 34.44 kg/m2  SpO2 96% Physical Exam  Constitutional: He is oriented to person, place, and time. He appears well-developed and well-nourished. No distress.  HENT:  Head: Normocephalic.  Eyes: Conjunctivae are normal. Pupils are equal, round, and reactive to light. No scleral icterus.  Neck: Normal range of motion. Neck supple. No thyromegaly present.  Cardiovascular: Normal rate and regular rhythm.  Exam reveals no gallop and no friction rub.   No murmur  heard. Pulmonary/Chest: Effort normal and breath sounds normal. No respiratory distress. He has no wheezes. He has no rales.  Abdominal: Soft. He exhibits no distension. Bowel sounds are decreased. There is no tenderness. There is no rebound.  Slightly hypoactive bowel sounds.   Musculoskeletal: Normal range of motion.  Neurological: He is alert and oriented to person, place, and time.  Skin: Skin is warm and dry. No rash noted.  Psychiatric: He has a normal mood and affect. His behavior is normal.  Nursing note and vitals reviewed.   ED Course  Procedures   DIAGNOSTIC STUDIES:  Oxygen Saturation is 95% on RA, adequate by my interpretation.    COORDINATION OF CARE:  3:22 AM Discussed treatment plan with pt at bedside and pt agreed to plan.  Labs Review Labs Reviewed  COMPREHENSIVE METABOLIC PANEL - Abnormal; Notable for the following:    Glucose, Bld 143 (*)  Alkaline Phosphatase 36 (*)    All other components within normal limits  URINALYSIS, ROUTINE W REFLEX MICROSCOPIC (NOT AT [facility]) - Abnormal; Notable for the following:    Hgb urine dipstick TRACE (*)    All other components within normal limits  URINE MICROSCOPIC-ADD ON - Abnormal; Notable for the following:    Squamous Epithelial / LPF 0-5 (*)    Bacteria, UA RARE (*)    All other components within normal limits  LIPASE, BLOOD  CBC    Imaging Review No results found. I have personally reviewed and evaluated these images and lab results as part of my medical decision-making.   EKG Interpretation None      MDM   Final diagnoses:  Generalized abdominal pain    Pt exam not highly suggestive of small bowel obstruction. He states he does not feel as though he is distended. Had some postprandial fullness. Mild nausea yesterday. No vomiting. Last bowel movement was yesterday. Similar episode in November treated without NG tube and resolve spontaneously. No history of surgery. Has had normal colonoscopy. If CT  normal, could potentially DC home on clears/liquids with close follow-up.   Tanna Furry, MD 02/20/16 0700  Tanna Furry, MD 02/20/16 [phone]

## 2016-02-26 ENCOUNTER — Other Ambulatory Visit: Payer: Self-pay | Admitting: Internal Medicine

## 2016-03-11 ENCOUNTER — Other Ambulatory Visit (HOSPITAL_COMMUNITY): Payer: Medicare Other

## 2016-03-18 ENCOUNTER — Ambulatory Visit (HOSPITAL_COMMUNITY): Payer: Medicare Other | Attending: Internal Medicine

## 2016-03-18 ENCOUNTER — Other Ambulatory Visit: Payer: Self-pay

## 2016-03-18 DIAGNOSIS — I119 Hypertensive heart disease without heart failure: Secondary | ICD-10-CM | POA: Insufficient documentation

## 2016-03-18 DIAGNOSIS — I2510 Atherosclerotic heart disease of native coronary artery without angina pectoris: Secondary | ICD-10-CM

## 2016-03-18 DIAGNOSIS — I340 Nonrheumatic mitral (valve) insufficiency: Secondary | ICD-10-CM | POA: Diagnosis not present

## 2016-03-18 DIAGNOSIS — I071 Rheumatic tricuspid insufficiency: Secondary | ICD-10-CM | POA: Diagnosis not present

## 2016-03-23 DIAGNOSIS — H31001 Unspecified chorioretinal scars, right eye: Secondary | ICD-10-CM | POA: Diagnosis not present

## 2016-03-23 DIAGNOSIS — H2513 Age-related nuclear cataract, bilateral: Secondary | ICD-10-CM | POA: Diagnosis not present

## 2016-03-23 DIAGNOSIS — H353122 Nonexudative age-related macular degeneration, left eye, intermediate dry stage: Secondary | ICD-10-CM | POA: Diagnosis not present

## 2016-03-23 DIAGNOSIS — H25013 Cortical age-related cataract, bilateral: Secondary | ICD-10-CM | POA: Diagnosis not present

## 2016-03-23 DIAGNOSIS — H353112 Nonexudative age-related macular degeneration, right eye, intermediate dry stage: Secondary | ICD-10-CM | POA: Diagnosis not present

## 2016-03-23 LAB — HM DIABETES EYE EXAM

## 2016-03-27 ENCOUNTER — Other Ambulatory Visit: Payer: Self-pay | Admitting: Internal Medicine

## 2016-03-27 ENCOUNTER — Other Ambulatory Visit: Payer: Self-pay

## 2016-03-27 MED ORDER — SIMVASTATIN 20 MG PO TABS: ORAL_TABLET | ORAL | Status: DC

## 2016-03-30 ENCOUNTER — Telehealth: Payer: Self-pay | Admitting: Internal Medicine

## 2016-03-30 NOTE — Telephone Encounter (Signed)
Pt calling for results of Echo done 03-18-16-pls call

## 2016-03-30 NOTE — Telephone Encounter (Signed)
Notes Recorded by Rodman Key, RN on 03/30/2016 at 12:17 PM Patient informed of echo results. He had a stress test in March 2017. I spoke with patient. He remains SOB on exertion walking up his driveway. It is not worse. No chest discomfort. Advised we would call him back if there are any new recommendations. Notes Recorded by Fay Records, MD on 03/21/2016 at 10:28 PM Pumping function of the heart is normal I would recomm that he be set up for a lexiscan myovue to evaluate grafts for patency

## 2016-04-08 ENCOUNTER — Encounter: Payer: Self-pay | Admitting: Internal Medicine

## 2016-05-20 ENCOUNTER — Ambulatory Visit: Payer: Medicare Other | Admitting: Internal Medicine

## 2016-05-25 ENCOUNTER — Other Ambulatory Visit (INDEPENDENT_AMBULATORY_CARE_PROVIDER_SITE_OTHER): Payer: Medicare Other

## 2016-05-25 DIAGNOSIS — E1159 Type 2 diabetes mellitus with other circulatory complications: Secondary | ICD-10-CM | POA: Diagnosis not present

## 2016-05-25 DIAGNOSIS — I10 Essential (primary) hypertension: Secondary | ICD-10-CM

## 2016-05-25 LAB — BASIC METABOLIC PANEL
BUN: 17 mg/dL (ref 6–23)
CHLORIDE: 105 meq/L (ref 96–112)
CO2: 28 meq/L (ref 19–32)
Calcium: 9.8 mg/dL (ref 8.4–10.5)
Creatinine, Ser: 0.93 mg/dL (ref 0.40–1.50)
GFR: 83.23 mL/min (ref >60.00)
GLUCOSE: 152 mg/dL — AB (ref 70–99)
POTASSIUM: 4 meq/L (ref 3.5–5.1)
SODIUM: 140 meq/L (ref 135–145)

## 2016-05-25 LAB — HEMOGLOBIN A1C: Hgb A1c MFr Bld: 7 % — ABNORMAL HIGH (ref 4.6–6.5)

## 2016-05-26 ENCOUNTER — Encounter: Payer: Self-pay | Admitting: Internal Medicine

## 2016-05-26 ENCOUNTER — Ambulatory Visit (INDEPENDENT_AMBULATORY_CARE_PROVIDER_SITE_OTHER): Payer: Medicare Other | Admitting: Internal Medicine

## 2016-05-26 VITALS — BP 120/60 | HR 73 | Wt 232.0 lb

## 2016-05-26 DIAGNOSIS — I10 Essential (primary) hypertension: Secondary | ICD-10-CM

## 2016-05-26 DIAGNOSIS — E785 Hyperlipidemia, unspecified: Secondary | ICD-10-CM

## 2016-05-26 DIAGNOSIS — E1159 Type 2 diabetes mellitus with other circulatory complications: Secondary | ICD-10-CM | POA: Diagnosis not present

## 2016-05-26 NOTE — Progress Notes (Signed)
Subjective:  Patient ID: [name], male    DOB: June 26, 1935  Age: 81 y.o. MRN: [id number]  CC: No chief complaint on file.   HPI [name] presents for OA, HTN, DM f/u  Outpatient Prescriptions Prior to Visit  Medication Sig Dispense Refill  . amLODipine (NORVASC) 5 MG tablet Take 1 tablet (5 mg total) by mouth daily. 30 tablet 10  . amLODipine (NORVASC) 5 MG tablet TAKE 1 TABLET (5 MG TOTAL) BY MOUTH DAILY. 30 tablet 10  . aspirin 325 MG tablet Take 325 mg by mouth daily.    . cholecalciferol (VITAMIN D) 1000 UNITS tablet Take 1,000 Units by mouth 2 (two) times daily.     Marland Kitchen docusate sodium (COLACE) 100 MG capsule Take 1 capsule (100 mg total) by mouth 3 (three) times daily as needed for mild constipation. 30 capsule 0  . HYDROcodone-acetaminophen (NORCO) 10-325 MG per tablet Take 1 tablet by mouth every 6 (six) hours as needed. 60 tablet 0  . Linaclotide (LINZESS) 145 MCG CAPS capsule Take 1 capsule (145 mcg total) by mouth 1 day or 1 dose. 30 capsule 11  . metFORMIN (GLUCOPHAGE) 500 MG tablet Take 1 tablet (500 mg total) by mouth 3 (three) times daily. 90 tablet 11  . methocarbamol (ROBAXIN) 500 MG tablet Take 1 tablet (500 mg total) by mouth 3 (three) times daily as needed for muscle spasms. 60 tablet 0  . metoprolol tartrate (LOPRESSOR) 25 MG tablet Take 1 tablet (25 mg total) by mouth daily. 30 tablet 11  . ondansetron (ZOFRAN) 4 MG tablet Take 1 tablet (4 mg total) by mouth every 8 (eight) hours as needed for nausea or vomiting. 20 tablet 0  . polyethylene glycol powder (GLYCOLAX) powder Take 17 g by mouth daily. 255 g 1  . simvastatin (ZOCOR) 20 MG tablet TAKE 1/2 TABLET BY MOUTH EVERY DAY AT BEDTIME 45 tablet 3   No facility-administered medications prior to visit.    ROS Review of Systems  Constitutional: Positive for fatigue. Negative for appetite change and unexpected weight change.  HENT: Negative for congestion, nosebleeds, sneezing, sore throat and  trouble swallowing.   Eyes: Negative for itching and visual disturbance.  Respiratory: Negative for cough.   Cardiovascular: Negative for chest pain, palpitations and leg swelling.  Gastrointestinal: Negative for nausea, diarrhea, blood in stool and abdominal distention.  Genitourinary: Negative for frequency and hematuria.  Musculoskeletal: Positive for back pain. Negative for joint swelling, gait problem and neck pain.  Skin: Negative for rash.  Neurological: Negative for dizziness, tremors, speech difficulty and weakness.  Psychiatric/Behavioral: Negative for sleep disturbance, dysphoric mood and agitation. The patient is not nervous/anxious.     Objective:  BP 120/60 mmHg  Pulse 73  Wt 232 lb (105.235 kg)  SpO2 95%  BP Readings from Last 3 Encounters:  05/26/16 120/60  02/20/16 158/87  02/18/16 120/64    Wt Readings from Last 3 Encounters:  05/26/16 232 lb (105.235 kg)  02/19/16 226 lb 7 oz (102.711 kg)  02/18/16 229 lb (103.874 kg)    Physical Exam  Constitutional: He is oriented to person, place, and time. He appears well-developed. No distress.  NAD  HENT:  Mouth/Throat: Oropharynx is clear and moist.  Eyes: Conjunctivae are normal. Pupils are equal, round, and reactive to light.  Neck: Normal range of motion. No JVD present. No thyromegaly present.  Cardiovascular: Normal rate, regular rhythm, normal heart sounds and intact distal pulses.  Exam reveals no gallop and no  friction rub.   No murmur heard. Pulmonary/Chest: Effort normal and breath sounds normal. No respiratory distress. He has no wheezes. He has no rales. He exhibits no tenderness.  Abdominal: Soft. Bowel sounds are normal. He exhibits no distension and no mass. There is no tenderness. There is no rebound and no guarding.  Musculoskeletal: Normal range of motion. He exhibits tenderness. He exhibits no edema.  Lymphadenopathy:    He has no cervical adenopathy.  Neurological: He is alert and oriented to  person, place, and time. He has normal reflexes. No cranial nerve deficit. He exhibits normal muscle tone. He displays a negative Romberg sign. Coordination and gait normal.  Skin: Skin is warm and dry. No rash noted.  Psychiatric: He has a normal mood and affect. His behavior is normal. Judgment and thought content normal.  Obese  Lab Results  Component Value Date   WBC 7.3 02/19/2016   HGB 13.5 02/19/2016   HCT 39.6 02/19/2016   PLT 216 02/19/2016   GLUCOSE 152* 05/25/2016   CHOL 116* 01/17/2016   TRIG 101 01/17/2016   HDL 47 01/17/2016   LDLCALC 49 01/17/2016   ALT 20 02/19/2016   AST 22 02/19/2016   NA 140 05/25/2016   K 4.0 05/25/2016   CL 105 05/25/2016   CREATININE 0.93 05/25/2016   BUN 17 05/25/2016   CO2 28 05/25/2016   TSH 1.51 01/17/2016   PSA 1.55 12/26/2010   INR 2.3 03/03/2012   HGBA1C 7.0* 05/25/2016    Ct Abdomen Pelvis W Contrast  02/20/2016  CLINICAL DATA:  Abdominal pain.  Prior small bowel obstruction EXAM: CT ABDOMEN AND PELVIS WITH CONTRAST TECHNIQUE: Multidetector CT imaging of the abdomen and pelvis was performed using the standard protocol following bolus administration of intravenous contrast. CONTRAST:  1 ISOVUE-300 IOPAMIDOL (ISOVUE-300) INJECTION 61% COMPARISON:  11/06/2015 FINDINGS: Lower chest and abdominal wall: Coronary atherosclerotic calcification. Small subpleural nodule along the left diaphragm is stable from prior and considered benign. Hepatobiliary: Probable hepatic steatosis. No focal lesion is seen.No evidence of biliary obstruction or stone. Pancreas: Unremarkable. Spleen: Unremarkable. Adrenals/Urinary Tract: Negative adrenals. No hydronephrosis or stone. Unremarkable bladder. Reproductive:Central prostate enlargement with nodule projecting into the bladder base. Stomach/Bowel: No obstruction. No inflammatory changes or evidence of appendicitis. There is asymmetric thickening of the left rectal wall which on coronal reformats appears ridge  like and is likely from asymmetric underdistention. This area is low-density, convincing even when accounting for streak artifact left hip prosthesis. It is reassuring that no mass was seen in this region on colonoscopy last year (there was a 5 mm rectal polyp that was excised with benign pathology). Vascular/Lymphatic: No acute vascular abnormality. No mass or adenopathy. Peritoneal: No ascites or pneumoperitoneum. Musculoskeletal: Total left hip arthroplasty. No acute finding. Lumbar disc and facet degeneration with prior L4-5 surgery. IMPRESSION: 1. Negative for bowel obstruction or other cause of acute abdominal pain. 2. Mild rectal wall thickening, likely from underdistention, as above. Electronically Signed   By: Monte Fantasia M.D.   On: 02/20/2016 08:35    Assessment & Plan:   There are no diagnoses linked to this encounter. I am having Mr. Freimuth maintain his cholecalciferol, amLODipine, ondansetron, methocarbamol, polyethylene glycol powder, docusate sodium, HYDROcodone-acetaminophen, aspirin, metFORMIN, metoprolol tartrate, linaclotide, amLODipine, and simvastatin.  No orders of the defined types were placed in this encounter.     Follow-up: No Follow-up on file.  Walker Kehr, MD

## 2016-05-26 NOTE — Assessment & Plan Note (Signed)
Metformin Wt loss need was discussed

## 2016-05-26 NOTE — Assessment & Plan Note (Signed)
On Simvastatin 

## 2016-05-26 NOTE — Progress Notes (Signed)
Pre visit review using our clinic review tool, if applicable. No additional management support is needed unless otherwise documented below in the visit note. 

## 2016-05-26 NOTE — Assessment & Plan Note (Signed)
Cont ASA, Metoprolol, Simvastatin and Norvasc Loose wt

## 2016-09-24 DIAGNOSIS — H40013 Open angle with borderline findings, low risk, bilateral: Secondary | ICD-10-CM | POA: Diagnosis not present

## 2016-09-24 DIAGNOSIS — H04123 Dry eye syndrome of bilateral lacrimal glands: Secondary | ICD-10-CM | POA: Diagnosis not present

## 2016-09-28 ENCOUNTER — Other Ambulatory Visit (INDEPENDENT_AMBULATORY_CARE_PROVIDER_SITE_OTHER): Payer: Medicare Other

## 2016-09-28 ENCOUNTER — Ambulatory Visit (INDEPENDENT_AMBULATORY_CARE_PROVIDER_SITE_OTHER): Payer: Medicare Other | Admitting: Internal Medicine

## 2016-09-28 ENCOUNTER — Encounter: Payer: Self-pay | Admitting: Internal Medicine

## 2016-09-28 DIAGNOSIS — E1159 Type 2 diabetes mellitus with other circulatory complications: Secondary | ICD-10-CM

## 2016-09-28 DIAGNOSIS — I10 Essential (primary) hypertension: Secondary | ICD-10-CM

## 2016-09-28 DIAGNOSIS — K5901 Slow transit constipation: Secondary | ICD-10-CM | POA: Diagnosis not present

## 2016-09-28 DIAGNOSIS — I2510 Atherosclerotic heart disease of native coronary artery without angina pectoris: Secondary | ICD-10-CM | POA: Diagnosis not present

## 2016-09-28 DIAGNOSIS — I480 Paroxysmal atrial fibrillation: Secondary | ICD-10-CM

## 2016-09-28 DIAGNOSIS — E785 Hyperlipidemia, unspecified: Secondary | ICD-10-CM

## 2016-09-28 LAB — HEMOGLOBIN A1C: Hgb A1c MFr Bld: 6.9 % — ABNORMAL HIGH (ref 4.6–6.5)

## 2016-09-28 LAB — BASIC METABOLIC PANEL
BUN: 15 mg/dL (ref 6–23)
CHLORIDE: 104 meq/L (ref 96–112)
CO2: 30 meq/L (ref 19–32)
Calcium: 9.6 mg/dL (ref 8.4–10.5)
Creatinine, Ser: 0.95 mg/dL (ref 0.40–1.50)
GFR: 81.14 mL/min (ref >60.00)
GLUCOSE: 155 mg/dL — AB (ref 70–99)
POTASSIUM: 4 meq/L (ref 3.5–5.1)
SODIUM: 141 meq/L (ref 135–145)

## 2016-09-28 NOTE — Progress Notes (Signed)
Pre visit review using our clinic review tool, if applicable. No additional management support is needed unless otherwise documented below in the visit note. 

## 2016-09-28 NOTE — Assessment & Plan Note (Signed)
On Simvastatin 

## 2016-09-28 NOTE — Assessment & Plan Note (Signed)
Linzess prn 

## 2016-09-28 NOTE — Progress Notes (Addendum)
Subjective:  Patient ID: [name], male    DOB: 1935-08-24  Age: 81 y.o. MRN: [id number]  CC: Hypertension (4 month F/U)   HPI [name] presents for HTN, DM, dyslipidemia f/u  Outpatient Medications Prior to Visit  Medication Sig Dispense Refill  . amLODipine (NORVASC) 5 MG tablet TAKE 1 TABLET (5 MG TOTAL) BY MOUTH DAILY. 30 tablet 10  . aspirin 325 MG tablet Take 325 mg by mouth daily.    . cholecalciferol (VITAMIN D) 1000 UNITS tablet Take 1,000 Units by mouth 2 (two) times daily.     Marland Kitchen docusate sodium (COLACE) 100 MG capsule Take 1 capsule (100 mg total) by mouth 3 (three) times daily as needed for mild constipation. 30 capsule 0  . Linaclotide (LINZESS) 145 MCG CAPS capsule Take 1 capsule (145 mcg total) by mouth 1 day or 1 dose. 30 capsule 11  . metFORMIN (GLUCOPHAGE) 500 MG tablet Take 1 tablet (500 mg total) by mouth 3 (three) times daily. 90 tablet 11  . metoprolol tartrate (LOPRESSOR) 25 MG tablet Take 1 tablet (25 mg total) by mouth daily. 30 tablet 11  . simvastatin (ZOCOR) 20 MG tablet TAKE 1/2 TABLET BY MOUTH EVERY DAY AT BEDTIME 45 tablet 3  . HYDROcodone-acetaminophen (NORCO) 10-325 MG per tablet Take 1 tablet by mouth every 6 (six) hours as needed. 60 tablet 0  . methocarbamol (ROBAXIN) 500 MG tablet Take 1 tablet (500 mg total) by mouth 3 (three) times daily as needed for muscle spasms. 60 tablet 0  . ondansetron (ZOFRAN) 4 MG tablet Take 1 tablet (4 mg total) by mouth every 8 (eight) hours as needed for nausea or vomiting. 20 tablet 0  . polyethylene glycol powder (GLYCOLAX) powder Take 17 g by mouth daily. 255 g 1   No facility-administered medications prior to visit.     ROS Review of Systems  Constitutional: Positive for fatigue. Negative for appetite change and unexpected weight change.  HENT: Negative for congestion, nosebleeds, sneezing, sore throat and trouble swallowing.   Eyes: Negative for itching and visual disturbance.    Respiratory: Negative for cough and shortness of breath.   Cardiovascular: Negative for chest pain, palpitations and leg swelling.  Gastrointestinal: Negative for abdominal distention, blood in stool, diarrhea and nausea.  Genitourinary: Negative for frequency and hematuria.  Musculoskeletal: Negative for back pain, gait problem, joint swelling and neck pain.  Skin: Negative for rash.  Neurological: Negative for dizziness, tremors, speech difficulty and weakness.  Psychiatric/Behavioral: Negative for agitation, dysphoric mood and sleep disturbance. The patient is not nervous/anxious.     Objective:  BP 126/82 (BP Location: Left Arm, Patient Position: Sitting, Cuff Size: Normal)   Pulse 72   Temp 97.6 F (36.4 C)   Ht 5\' 8"  (1.727 m)   Wt 229 lb (103.9 kg)   SpO2 97%   BMI 34.82 kg/m   BP Readings from Last 3 Encounters:  09/28/16 126/82  05/26/16 120/60  02/20/16 158/87    Wt Readings from Last 3 Encounters:  09/28/16 229 lb (103.9 kg)  05/26/16 232 lb (105.2 kg)  02/19/16 226 lb 7 oz (102.7 kg)    Physical Exam  Constitutional: He is oriented to person, place, and time. He appears well-developed. No distress.  NAD  HENT:  Mouth/Throat: Oropharynx is clear and moist.  Eyes: Conjunctivae are normal. Pupils are equal, round, and reactive to light.  Neck: Normal range of motion. No JVD present. No thyromegaly present.  Cardiovascular: Normal rate,  regular rhythm, normal heart sounds and intact distal pulses.  Exam reveals no gallop and no friction rub.   No murmur heard. Pulmonary/Chest: Effort normal and breath sounds normal. No respiratory distress. He has no wheezes. He has no rales. He exhibits no tenderness.  Abdominal: Soft. Bowel sounds are normal. He exhibits no distension and no mass. There is no tenderness. There is no rebound and no guarding.  Musculoskeletal: Normal range of motion. He exhibits tenderness. He exhibits no edema.  Lymphadenopathy:    He has no  cervical adenopathy.  Neurological: He is alert and oriented to person, place, and time. He has normal reflexes. No cranial nerve deficit. He exhibits normal muscle tone. He displays a negative Romberg sign. Coordination and gait normal.  Skin: Skin is warm and dry. No rash noted.  Psychiatric: He has a normal mood and affect. His behavior is normal. Judgment and thought content normal.  Obese Trace B edenma   Lab Results  Component Value Date   WBC 7.3 02/19/2016   HGB 13.5 02/19/2016   HCT 39.6 02/19/2016   PLT 216 02/19/2016   GLUCOSE 155 (H) 09/28/2016   CHOL 116 (L) 01/17/2016   TRIG 101 01/17/2016   HDL 47 01/17/2016   LDLCALC 49 01/17/2016   ALT 20 02/19/2016   AST 22 02/19/2016   NA 141 09/28/2016   K 4.0 09/28/2016   CL 104 09/28/2016   CREATININE 0.95 09/28/2016   BUN 15 09/28/2016   CO2 30 09/28/2016   TSH 1.51 01/17/2016   PSA 1.55 12/26/2010   INR 2.3 03/03/2012   HGBA1C 6.9 (H) 09/28/2016    Ct Abdomen Pelvis W Contrast  Result Date: 02/20/2016 CLINICAL DATA:  Abdominal pain.  Prior small bowel obstruction EXAM: CT ABDOMEN AND PELVIS WITH CONTRAST TECHNIQUE: Multidetector CT imaging of the abdomen and pelvis was performed using the standard protocol following bolus administration of intravenous contrast. CONTRAST:  1 ISOVUE-300 IOPAMIDOL (ISOVUE-300) INJECTION 61% COMPARISON:  11/06/2015 FINDINGS: Lower chest and abdominal wall: Coronary atherosclerotic calcification. Small subpleural nodule along the left diaphragm is stable from prior and considered benign. Hepatobiliary: Probable hepatic steatosis. No focal lesion is seen.No evidence of biliary obstruction or stone. Pancreas: Unremarkable. Spleen: Unremarkable. Adrenals/Urinary Tract: Negative adrenals. No hydronephrosis or stone. Unremarkable bladder. Reproductive:Central prostate enlargement with nodule projecting into the bladder base. Stomach/Bowel: No obstruction. No inflammatory changes or evidence of  appendicitis. There is asymmetric thickening of the left rectal wall which on coronal reformats appears ridge like and is likely from asymmetric underdistention. This area is low-density, convincing even when accounting for streak artifact left hip prosthesis. It is reassuring that no mass was seen in this region on colonoscopy last year (there was a 5 mm rectal polyp that was excised with benign pathology). Vascular/Lymphatic: No acute vascular abnormality. No mass or adenopathy. Peritoneal: No ascites or pneumoperitoneum. Musculoskeletal: Total left hip arthroplasty. No acute finding. Lumbar disc and facet degeneration with prior L4-5 surgery. IMPRESSION: 1. Negative for bowel obstruction or other cause of acute abdominal pain. 2. Mild rectal wall thickening, likely from underdistention, as above. Electronically Signed   By: Monte Fantasia M.D.   On: 02/20/2016 08:35    Assessment & Plan:   There are no diagnoses linked to this encounter. I have discontinued Mr. Sampsell ondansetron, methocarbamol, polyethylene glycol powder, and HYDROcodone-acetaminophen. I am also having him maintain his cholecalciferol, docusate sodium, aspirin, metFORMIN, metoprolol tartrate, linaclotide, amLODipine, and simvastatin.  No orders of the defined types were placed in  this encounter.    Follow-up: No Follow-up on file.  Walker Kehr, MD

## 2016-09-28 NOTE — Assessment & Plan Note (Signed)
ASA,  Lopressor 

## 2016-09-28 NOTE — Assessment & Plan Note (Signed)
On Metformin 

## 2016-09-28 NOTE — Assessment & Plan Note (Signed)
Metoprolol, Norvasc

## 2016-09-28 NOTE — Assessment & Plan Note (Signed)
ASA, Lopressor, Norvasc, Simvastatin

## 2016-11-18 ENCOUNTER — Other Ambulatory Visit: Payer: Self-pay | Admitting: Internal Medicine

## 2016-12-28 ENCOUNTER — Other Ambulatory Visit (INDEPENDENT_AMBULATORY_CARE_PROVIDER_SITE_OTHER): Payer: Medicare Other

## 2016-12-28 DIAGNOSIS — E1159 Type 2 diabetes mellitus with other circulatory complications: Secondary | ICD-10-CM | POA: Diagnosis not present

## 2016-12-28 DIAGNOSIS — I10 Essential (primary) hypertension: Secondary | ICD-10-CM

## 2016-12-28 LAB — BASIC METABOLIC PANEL
BUN: 20 mg/dL (ref 6–23)
CALCIUM: 9.3 mg/dL (ref 8.4–10.5)
CO2: 28 mEq/L (ref 19–32)
CREATININE: 1.01 mg/dL (ref 0.40–1.50)
Chloride: 105 mEq/L (ref 96–112)
GFR: 75.56 mL/min (ref >60.00)
Glucose, Bld: 167 mg/dL — ABNORMAL HIGH (ref 70–99)
Potassium: 4 mEq/L (ref 3.5–5.1)
Sodium: 139 mEq/L (ref 135–145)

## 2016-12-28 LAB — HEMOGLOBIN A1C: HEMOGLOBIN A1C: 7.4 % — AB (ref 4.6–6.5)

## 2016-12-29 ENCOUNTER — Encounter: Payer: Self-pay | Admitting: Internal Medicine

## 2016-12-29 ENCOUNTER — Ambulatory Visit (INDEPENDENT_AMBULATORY_CARE_PROVIDER_SITE_OTHER): Payer: Medicare Other | Admitting: Internal Medicine

## 2016-12-29 DIAGNOSIS — E785 Hyperlipidemia, unspecified: Secondary | ICD-10-CM

## 2016-12-29 DIAGNOSIS — E1159 Type 2 diabetes mellitus with other circulatory complications: Secondary | ICD-10-CM | POA: Diagnosis not present

## 2016-12-29 DIAGNOSIS — I480 Paroxysmal atrial fibrillation: Secondary | ICD-10-CM

## 2016-12-29 DIAGNOSIS — I2510 Atherosclerotic heart disease of native coronary artery without angina pectoris: Secondary | ICD-10-CM | POA: Diagnosis not present

## 2016-12-29 NOTE — Assessment & Plan Note (Signed)
ASA and Lopressor

## 2016-12-29 NOTE — Assessment & Plan Note (Signed)
- 

## 2016-12-29 NOTE — Assessment & Plan Note (Signed)
ASA, Amlodipine, Lopressor, Zocor

## 2016-12-29 NOTE — Assessment & Plan Note (Signed)
Worse He will loose wt Metformin

## 2016-12-29 NOTE — Progress Notes (Signed)
Subjective:  Patient ID: [name], male    DOB: April 24, 1935  Age: 82 y.o. MRN: [id number]  CC: Follow-up (Afib, HTN, DM II)   HPI [name] presents for CAD, DM, HTN f/u  Outpatient Medications Prior to Visit  Medication Sig Dispense Refill  . amLODipine (NORVASC) 5 MG tablet TAKE 1 TABLET (5 MG TOTAL) BY MOUTH DAILY. 30 tablet 10  . aspirin 325 MG tablet Take 325 mg by mouth daily.    . cholecalciferol (VITAMIN D) 1000 UNITS tablet Take 1,000 Units by mouth 2 (two) times daily.     Marland Kitchen docusate sodium (COLACE) 100 MG capsule Take 1 capsule (100 mg total) by mouth 3 (three) times daily as needed for mild constipation. 30 capsule 0  . metFORMIN (GLUCOPHAGE) 500 MG tablet TAKE 1 TABLET BY MOUTH 3 TIMES A DAY 90 tablet 5  . metoprolol tartrate (LOPRESSOR) 25 MG tablet Take 1 tablet (25 mg total) by mouth daily. 30 tablet 11  . simvastatin (ZOCOR) 20 MG tablet TAKE 1/2 TABLET BY MOUTH EVERY DAY AT BEDTIME 45 tablet 3  . Linaclotide (LINZESS) 145 MCG CAPS capsule Take 1 capsule (145 mcg total) by mouth 1 day or 1 dose. 30 capsule 11   No facility-administered medications prior to visit.     ROS Review of Systems  Constitutional: Positive for unexpected weight change. Negative for appetite change and fatigue.  HENT: Negative for congestion, nosebleeds, sneezing, sore throat and trouble swallowing.   Eyes: Negative for itching and visual disturbance.  Respiratory: Negative for cough.   Cardiovascular: Negative for chest pain, palpitations and leg swelling.  Gastrointestinal: Negative for abdominal distention, blood in stool, diarrhea and nausea.  Genitourinary: Negative for frequency and hematuria.  Musculoskeletal: Negative for back pain, gait problem, joint swelling and neck pain.  Skin: Negative for rash.  Neurological: Negative for dizziness, tremors, speech difficulty and weakness.  Psychiatric/Behavioral: Negative for agitation, dysphoric mood, sleep disturbance  and suicidal ideas. The patient is not nervous/anxious.     Objective:  BP (!) 110/58   Pulse 68   Temp 98.1 F (36.7 C) (Oral)   Resp 16   Ht 5\' 8"  (1.727 m)   Wt 233 lb (105.7 kg)   SpO2 97%   BMI 35.43 kg/m   BP Readings from Last 3 Encounters:  12/29/16 (!) 110/58  09/28/16 126/82  05/26/16 120/60    Wt Readings from Last 3 Encounters:  12/29/16 233 lb (105.7 kg)  09/28/16 229 lb (103.9 kg)  05/26/16 232 lb (105.2 kg)    Physical Exam  Constitutional: He is oriented to person, place, and time. He appears well-developed. No distress.  NAD  HENT:  Mouth/Throat: Oropharynx is clear and moist.  Eyes: Conjunctivae are normal. Pupils are equal, round, and reactive to light.  Neck: Normal range of motion. No JVD present. No thyromegaly present.  Cardiovascular: Normal rate, regular rhythm, normal heart sounds and intact distal pulses.  Exam reveals no gallop and no friction rub.   No murmur heard. Pulmonary/Chest: Effort normal and breath sounds normal. No respiratory distress. He has no wheezes. He has no rales. He exhibits no tenderness.  Abdominal: Soft. Bowel sounds are normal. He exhibits no distension and no mass. There is no tenderness. There is no rebound and no guarding.  Musculoskeletal: Normal range of motion. He exhibits edema and tenderness.  Lymphadenopathy:    He has no cervical adenopathy.  Neurological: He is alert and oriented to person, place, and time.  He has normal reflexes. No cranial nerve deficit. He exhibits normal muscle tone. He displays a negative Romberg sign. Coordination and gait normal.  Skin: Skin is warm and dry. No rash noted.  Psychiatric: He has a normal mood and affect. His behavior is normal. Judgment and thought content normal.  Obese LS tender B trace edema LEs   Lab Results  Component Value Date   WBC 7.3 02/19/2016   HGB 13.5 02/19/2016   HCT 39.6 02/19/2016   PLT 216 02/19/2016   GLUCOSE 167 (H) 12/28/2016   CHOL 116 (L)  01/17/2016   TRIG 101 01/17/2016   HDL 47 01/17/2016   LDLCALC 49 01/17/2016   ALT 20 02/19/2016   AST 22 02/19/2016   NA 139 12/28/2016   K 4.0 12/28/2016   CL 105 12/28/2016   CREATININE 1.01 12/28/2016   BUN 20 12/28/2016   CO2 28 12/28/2016   TSH 1.51 01/17/2016   PSA 1.55 12/26/2010   INR 2.3 03/03/2012   HGBA1C 7.4 (H) 12/28/2016    Ct Abdomen Pelvis W Contrast  Result Date: 02/20/2016 CLINICAL DATA:  Abdominal pain.  Prior small bowel obstruction EXAM: CT ABDOMEN AND PELVIS WITH CONTRAST TECHNIQUE: Multidetector CT imaging of the abdomen and pelvis was performed using the standard protocol following bolus administration of intravenous contrast. CONTRAST:  1 ISOVUE-300 IOPAMIDOL (ISOVUE-300) INJECTION 61% COMPARISON:  11/06/2015 FINDINGS: Lower chest and abdominal wall: Coronary atherosclerotic calcification. Small subpleural nodule along the left diaphragm is stable from prior and considered benign. Hepatobiliary: Probable hepatic steatosis. No focal lesion is seen.No evidence of biliary obstruction or stone. Pancreas: Unremarkable. Spleen: Unremarkable. Adrenals/Urinary Tract: Negative adrenals. No hydronephrosis or stone. Unremarkable bladder. Reproductive:Central prostate enlargement with nodule projecting into the bladder base. Stomach/Bowel: No obstruction. No inflammatory changes or evidence of appendicitis. There is asymmetric thickening of the left rectal wall which on coronal reformats appears ridge like and is likely from asymmetric underdistention. This area is low-density, convincing even when accounting for streak artifact left hip prosthesis. It is reassuring that no mass was seen in this region on colonoscopy last year (there was a 5 mm rectal polyp that was excised with benign pathology). Vascular/Lymphatic: No acute vascular abnormality. No mass or adenopathy. Peritoneal: No ascites or pneumoperitoneum. Musculoskeletal: Total left hip arthroplasty. No acute finding. Lumbar  disc and facet degeneration with prior L4-5 surgery. IMPRESSION: 1. Negative for bowel obstruction or other cause of acute abdominal pain. 2. Mild rectal wall thickening, likely from underdistention, as above. Electronically Signed   By: Monte Fantasia M.D.   On: 02/20/2016 08:35    Assessment & Plan:   There are no diagnoses linked to this encounter. I have discontinued Mr. Niebel linaclotide. I am also having him maintain his cholecalciferol, docusate sodium, aspirin, metoprolol tartrate, amLODipine, simvastatin, and metFORMIN.  No orders of the defined types were placed in this encounter.    Follow-up: No Follow-up on file.  Walker Kehr, MD

## 2016-12-29 NOTE — Progress Notes (Signed)
Pre-visit discussion using our clinic review tool. No additional management support is needed unless otherwise documented below in the visit note.  

## 2017-01-17 ENCOUNTER — Other Ambulatory Visit: Payer: Self-pay | Admitting: Internal Medicine

## 2017-01-22 ENCOUNTER — Ambulatory Visit (INDEPENDENT_AMBULATORY_CARE_PROVIDER_SITE_OTHER): Payer: Medicare Other | Admitting: Internal Medicine

## 2017-01-22 ENCOUNTER — Encounter (INDEPENDENT_AMBULATORY_CARE_PROVIDER_SITE_OTHER): Payer: Self-pay

## 2017-01-22 ENCOUNTER — Encounter: Payer: Self-pay | Admitting: Internal Medicine

## 2017-01-22 VITALS — BP 132/68 | HR 67 | Ht 68.0 in | Wt 235.4 lb

## 2017-01-22 DIAGNOSIS — Z79899 Other long term (current) drug therapy: Secondary | ICD-10-CM

## 2017-01-22 DIAGNOSIS — I2510 Atherosclerotic heart disease of native coronary artery without angina pectoris: Secondary | ICD-10-CM | POA: Diagnosis not present

## 2017-01-22 DIAGNOSIS — E785 Hyperlipidemia, unspecified: Secondary | ICD-10-CM

## 2017-01-22 NOTE — Patient Instructions (Signed)
Medication Instructions:    Your physician recommends that you continue on your current medications as directed. Please refer to the Current Medication list given to you today.  --- If you need a refill on your cardiac medications before your next appointment, please call your pharmacy. ---  Labwork:  Today: CBC w/ diff, Lipid profile, BMET and BNP  Testing/Procedures:  None ordered  Follow-Up:  Your physician wants you to follow-up in: 1 year with Dr. Harrington Challenger reminder letter in the mail two months in advance. If you don't receive a letter, please call our office to schedule the follow-up appointment.  Thank you for choosing [facility]!!

## 2017-01-22 NOTE — Progress Notes (Signed)
Cardiology Office Note   Date:  01/22/2017   ID:  [name], DOB 05/03/1935, MRN [id number]  PCP:  Walker Kehr, MD  Cardiologist:   [name], MD   F/u of CAD      History of Present Illness: [name] is a 82 y.o. male with a history ofCAD, status post CABG 01/2011, diabetes, hypertension, nephrolithiasis, hyperlipidemia, postoperative atrial fibrillation. LHC 01/12/11 prior to his CABG demonstrated left main and severe double vessel CAD. EF 45%. He had no significant disease in the RCA. [place] at the time of his CABG: LIMA-distal LAD, SVG-circumflex. He remained on Coumadin and amiodarone for some time postoperatively b/c of postoperative atrial fibrillation THe patient was last in clinic in March 2017    Pt still has SOB   Not getting Worse   SOB with [place]       Current Meds  Medication Sig  . amLODipine (NORVASC) 5 MG tablet TAKE 1 TABLET (5 MG TOTAL) BY MOUTH DAILY.  Marland Kitchen aspirin 325 MG tablet Take 325 mg by mouth daily.  . cholecalciferol (VITAMIN D) 1000 UNITS tablet Take 1,000 Units by mouth 2 (two) times daily.   Marland Kitchen docusate sodium (COLACE) 100 MG capsule Take 1 capsule (100 mg total) by mouth 3 (three) times daily as needed for mild constipation.  . metFORMIN (GLUCOPHAGE) 500 MG tablet TAKE 1 TABLET BY MOUTH 3 TIMES A DAY  . metoprolol tartrate (LOPRESSOR) 25 MG tablet TAKE 1 TABLET BY MOUTH EVERY DAY  . simvastatin (ZOCOR) 20 MG tablet TAKE 1/2 TABLET BY MOUTH EVERY DAY AT BEDTIME     Allergies:   Patient has no known allergies.   Past Medical History:  Diagnosis Date  . Arthritis   . Atrial fibrillation ([place])    post op; amiodarone and coumadin continued for 3 mos post op  . CARCINOMA, SKIN, SQUAMOUS CELL 01/07/2010  . Cataract    beginning of cataracts  . COLONIC POLYPS, HX OF 04/26/2007  . Coronary artery disease    s/p CABG 3/12: L-LAD, S-CFX (Dr. Roxy Manns); EF 45% at cath prior to CABG  . DIABETES MELLITUS, TYPE II  04/26/2007  . Hyperlipidemia   . HYPERTENSION 04/26/2007  . NEPHROLITHIASIS, HX OF 04/26/2007  . SBO (small bowel obstruction) 10/2015   Partial   . Shortness of breath dyspnea    with exertion  . Unspecified hearing loss 12/18/2008    Past Surgical History:  Procedure Laterality Date  . BACK SURGERY     disectomy  . COLONOSCOPY    . CORONARY ARTERY BYPASS GRAFT  01/12/2011  . LUMBAR LAMINECTOMY/DECOMPRESSION MICRODISCECTOMY N/A 05/08/2015   Procedure: LUMBAR LAMINECTOMY/DECOMPRESSION MICRODISCECTOMY 1 LEVEL Lumbar four five;  Surgeon: Melina Schools, MD;  Location: [place];  Service: Orthopedics;  Laterality: N/A;  . TOTAL HIP ARTHROPLASTY Left    left on 04/15/07  . TYMPANIC MEMBRANE REPAIR  1981     Social History:  The patient  reports that he quit smoking about 36 years ago. He has never used smokeless tobacco. He reports that he does not drink alcohol or use drugs.   Family History:  The patient's family history includes Breast cancer in his sister; Cancer in his brother and sister; Heart disease in his brother and father; Heart failure in his mother.    ROS:  Please see the history of present illness. All other systems are reviewed and  Negative to the above problem except as noted.    PHYSICAL EXAM:  VS:  BP 132/68   Pulse 67   Ht 5\' 8"  (1.727 m)   Wt 235 lb 6.4 oz (106.8 kg)   BMI 35.79 kg/m   GEN:  Morbidly obese 82 yo in no acute distress  HEENT: normal  Neck: no JVD, carotid bruits, or masses Cardiac: RRR; no murmurs, rubs, or gallops,no edema  Respiratory:  clear to auscultation bilaterally, normal work of breathing GI: soft, nontender, nondistended, + BS  No hepatomegaly  MS: no deformity Moving all extremities   Skin: warm and dry, no rash Neuro:  Strength and sensation are intact Psych: euthymic mood, full affect   EKG:  EKG is ordered today.  SR 67 bpm  First degree AV block  PR 248 msec     Lipid Panel    Component Value Date/Time   CHOL 116 (L)  01/17/2016 1209   TRIG 101 01/17/2016 1209   TRIG 78 11/05/2006 1123   HDL 47 01/17/2016 1209   CHOLHDL 2.5 01/17/2016 1209   VLDL 20 01/17/2016 1209   LDLCALC 49 01/17/2016 1209      Wt Readings from Last 3 Encounters:  01/22/17 235 lb 6.4 oz (106.8 kg)  12/29/16 233 lb (105.7 kg)  09/28/16 229 lb (103.9 kg)      ASSESSMENT AND PLAN:  1  CAD  I am not convinced current symptoms represent angina  Wt may be contributing  Encouraged portion control and exercise Will check CBC, BEMT and BNP  2  HL  Check lipids   3DM  On oral agents  A1C elevatd   Discussed diet Needs to make changes    4  HTN  Fair  Continue meds  Work on wt loss   F/U in 1 year     Current medicines are reviewed at length with the patient today.  The patient does not have concerns regarding medicines.  Signed, [name], MD  01/22/2017 1:58 PM    [facility] [place], [place],   [address] Phone: [phone]; Fax: [phone]

## 2017-01-23 LAB — LIPID PANEL
CHOL/HDL RATIO: 2.7 ratio (ref 0.0–5.0)
Cholesterol, Total: 116 mg/dL (ref 100–199)
HDL: 43 mg/dL (ref >39)
LDL Calculated: 41 mg/dL (ref 0–99)
Triglycerides: 158 mg/dL — ABNORMAL HIGH (ref 0–149)
VLDL CHOLESTEROL CAL: 32 mg/dL (ref 5–40)

## 2017-01-23 LAB — BASIC METABOLIC PANEL
BUN / CREAT RATIO: 17 (ref 10–24)
BUN: 18 mg/dL (ref 8–27)
CHLORIDE: 101 mmol/L (ref 96–106)
CO2: 24 mmol/L (ref 18–29)
Calcium: 9.6 mg/dL (ref 8.6–10.2)
Creatinine, Ser: 1.05 mg/dL (ref 0.76–1.27)
GFR calc Af Amer: 78 mL/min/{1.73_m2} (ref >59)
GFR calc non Af Amer: 67 mL/min/{1.73_m2} (ref >59)
GLUCOSE: 156 mg/dL — AB (ref 65–99)
POTASSIUM: 4.6 mmol/L (ref 3.5–5.2)
SODIUM: 141 mmol/L (ref 134–144)

## 2017-01-23 LAB — CBC WITH DIFFERENTIAL/PLATELET
Basophils Absolute: 0 10*3/uL (ref 0.0–0.2)
Basos: 0 %
EOS (ABSOLUTE): 0.2 10*3/uL (ref 0.0–0.4)
EOS: 3 %
HEMATOCRIT: 40.3 % (ref 37.5–51.0)
Hemoglobin: 13.5 g/dL (ref 13.0–17.7)
IMMATURE GRANS (ABS): 0 10*3/uL (ref 0.0–0.1)
Immature Granulocytes: 0 %
Lymphocytes Absolute: 2.7 10*3/uL (ref 0.7–3.1)
Lymphs: 35 %
MCH: 30.4 pg (ref 26.6–33.0)
MCHC: 33.5 g/dL (ref 31.5–35.7)
MCV: 91 fL (ref 79–97)
MONOS ABS: 0.8 10*3/uL (ref 0.1–0.9)
Monocytes: 11 %
NEUTROS ABS: 3.8 10*3/uL (ref 1.4–7.0)
Neutrophils: 51 %
PLATELETS: 228 10*3/uL (ref 150–379)
RBC: 4.44 x10E6/uL (ref 4.14–5.80)
RDW: 13.4 % (ref 12.3–15.4)
WBC: 7.6 10*3/uL (ref 3.4–10.8)

## 2017-01-23 LAB — PRO B NATRIURETIC PEPTIDE: NT-PRO BNP: 392 pg/mL (ref 0–486)

## 2017-02-17 ENCOUNTER — Other Ambulatory Visit: Payer: Self-pay | Admitting: Internal Medicine

## 2017-02-19 ENCOUNTER — Other Ambulatory Visit: Payer: Self-pay | Admitting: Internal Medicine

## 2017-02-22 ENCOUNTER — Other Ambulatory Visit: Payer: Self-pay | Admitting: Internal Medicine

## 2017-02-24 ENCOUNTER — Other Ambulatory Visit: Payer: Self-pay | Admitting: Internal Medicine

## 2017-03-01 ENCOUNTER — Other Ambulatory Visit: Payer: Self-pay | Admitting: Internal Medicine

## 2017-03-25 DIAGNOSIS — H25013 Cortical age-related cataract, bilateral: Secondary | ICD-10-CM | POA: Diagnosis not present

## 2017-03-25 DIAGNOSIS — H40021 Open angle with borderline findings, high risk, right eye: Secondary | ICD-10-CM | POA: Diagnosis not present

## 2017-03-25 DIAGNOSIS — H2513 Age-related nuclear cataract, bilateral: Secondary | ICD-10-CM | POA: Diagnosis not present

## 2017-03-25 DIAGNOSIS — H401121 Primary open-angle glaucoma, left eye, mild stage: Secondary | ICD-10-CM | POA: Diagnosis not present

## 2017-03-25 DIAGNOSIS — E119 Type 2 diabetes mellitus without complications: Secondary | ICD-10-CM | POA: Diagnosis not present

## 2017-03-25 LAB — HM DIABETES EYE EXAM

## 2017-03-26 ENCOUNTER — Encounter: Payer: Self-pay | Admitting: Internal Medicine

## 2017-03-29 ENCOUNTER — Other Ambulatory Visit (INDEPENDENT_AMBULATORY_CARE_PROVIDER_SITE_OTHER): Payer: Medicare Other

## 2017-03-29 ENCOUNTER — Ambulatory Visit (INDEPENDENT_AMBULATORY_CARE_PROVIDER_SITE_OTHER): Payer: Medicare Other | Admitting: Internal Medicine

## 2017-03-29 ENCOUNTER — Encounter: Payer: Self-pay | Admitting: Internal Medicine

## 2017-03-29 DIAGNOSIS — I480 Paroxysmal atrial fibrillation: Secondary | ICD-10-CM | POA: Diagnosis not present

## 2017-03-29 DIAGNOSIS — E1159 Type 2 diabetes mellitus with other circulatory complications: Secondary | ICD-10-CM | POA: Diagnosis not present

## 2017-03-29 DIAGNOSIS — I2510 Atherosclerotic heart disease of native coronary artery without angina pectoris: Secondary | ICD-10-CM | POA: Diagnosis not present

## 2017-03-29 DIAGNOSIS — I10 Essential (primary) hypertension: Secondary | ICD-10-CM

## 2017-03-29 LAB — BASIC METABOLIC PANEL
BUN: 21 mg/dL (ref 6–23)
CHLORIDE: 103 meq/L (ref 96–112)
CO2: 29 mEq/L (ref 19–32)
CREATININE: 1.01 mg/dL (ref 0.40–1.50)
Calcium: 9.8 mg/dL (ref 8.4–10.5)
GFR: 75.51 mL/min (ref >60.00)
Glucose, Bld: 171 mg/dL — ABNORMAL HIGH (ref 70–99)
POTASSIUM: 4.5 meq/L (ref 3.5–5.1)
Sodium: 140 mEq/L (ref 135–145)

## 2017-03-29 LAB — HEMOGLOBIN A1C: HEMOGLOBIN A1C: 8 % — AB (ref 4.6–6.5)

## 2017-03-29 MED ORDER — REPAGLINIDE 1 MG PO TABS: 1.0000 mg | ORAL_TABLET | Freq: Three times a day (TID) | ORAL | 11 refills | Status: DC

## 2017-03-29 NOTE — Assessment & Plan Note (Signed)
Worse Added Prandin 

## 2017-03-29 NOTE — Assessment & Plan Note (Signed)
No CP 

## 2017-03-29 NOTE — Progress Notes (Signed)
Subjective:  Patient ID: [name], male    DOB: 1935-04-21  Age: 82 y.o. MRN: [id number]  CC: No chief complaint on file.   HPI AERION BAGDASARIAN presents for DM, HTN, CAD f/u  Outpatient Medications Prior to Visit  Medication Sig Dispense Refill  . amLODipine (NORVASC) 5 MG tablet TAKE 1 TABLET (5 MG TOTAL) BY MOUTH DAILY. 30 tablet 11  . aspirin 81 MG tablet Take 81 mg by mouth daily.     . cholecalciferol (VITAMIN D) 1000 UNITS tablet Take 1,000 Units by mouth 2 (two) times daily.     Marland Kitchen docusate sodium (COLACE) 100 MG capsule Take 1 capsule (100 mg total) by mouth 3 (three) times daily as needed for mild constipation. 30 capsule 0  . metFORMIN (GLUCOPHAGE) 500 MG tablet TAKE 1 TABLET BY MOUTH 3 TIMES A DAY 90 tablet 5  . metoprolol tartrate (LOPRESSOR) 25 MG tablet TAKE 1 TABLET BY MOUTH EVERY DAY 30 tablet 9  . simvastatin (ZOCOR) 20 MG tablet TAKE 1/2 TABLET BY MOUTH EVERY DAY AT BEDTIME 45 tablet 3   No facility-administered medications prior to visit.     ROS Review of Systems  Constitutional: Negative for appetite change, fatigue and unexpected weight change.  HENT: Negative for congestion, nosebleeds, sneezing, sore throat and trouble swallowing.   Eyes: Negative for itching and visual disturbance.  Respiratory: Positive for shortness of breath. Negative for cough and chest tightness.   Cardiovascular: Negative for chest pain, palpitations and leg swelling.  Gastrointestinal: Negative for abdominal distention, blood in stool, diarrhea and nausea.  Genitourinary: Negative for frequency and hematuria.  Musculoskeletal: Positive for back pain. Negative for gait problem, joint swelling and neck pain.  Skin: Negative for rash.  Neurological: Negative for dizziness, tremors, speech difficulty and weakness.  Psychiatric/Behavioral: Negative for agitation, dysphoric mood and sleep disturbance. The patient is not nervous/anxious.     Objective:  BP 114/62 (BP  Location: Left Arm, Patient Position: Sitting, Cuff Size: Large)   Pulse 72   Temp 98.5 F (36.9 C) (Oral)   Ht 5\' 8"  (1.727 m)   Wt 232 lb 0.6 oz (105.3 kg)   SpO2 98%   BMI 35.28 kg/m   BP Readings from Last 3 Encounters:  03/29/17 114/62  01/22/17 132/68  12/29/16 (!) 110/58    Wt Readings from Last 3 Encounters:  03/29/17 232 lb 0.6 oz (105.3 kg)  01/22/17 235 lb 6.4 oz (106.8 kg)  12/29/16 233 lb (105.7 kg)    Physical Exam  Constitutional: He is oriented to person, place, and time. He appears well-developed. No distress.  NAD  HENT:  Mouth/Throat: Oropharynx is clear and moist.  Eyes: Conjunctivae are normal. Pupils are equal, round, and reactive to light.  Neck: Normal range of motion. No JVD present. No thyromegaly present.  Cardiovascular: Normal rate, regular rhythm, normal heart sounds and intact distal pulses.  Exam reveals no gallop and no friction rub.   No murmur heard. Pulmonary/Chest: Effort normal and breath sounds normal. No respiratory distress. He has no wheezes. He has no rales. He exhibits no tenderness.  Abdominal: Soft. Bowel sounds are normal. He exhibits no distension and no mass. There is no tenderness. There is no rebound and no guarding.  Musculoskeletal: Normal range of motion. He exhibits no edema or tenderness.  Lymphadenopathy:    He has no cervical adenopathy.  Neurological: He is alert and oriented to person, place, and time. He has normal reflexes. No cranial nerve  deficit. He exhibits normal muscle tone. He displays a negative Romberg sign. Coordination and gait normal.  Skin: Skin is warm and dry. No rash noted.  Psychiatric: He has a normal mood and affect. His behavior is normal. Judgment and thought content normal.    Lab Results  Component Value Date   WBC 7.6 01/22/2017   HGB 13.5 02/19/2016   HCT 40.3 01/22/2017   PLT 228 01/22/2017   GLUCOSE 171 (H) 03/29/2017   CHOL 116 01/22/2017   TRIG 158 (H) 01/22/2017   HDL 43  01/22/2017   LDLCALC 41 01/22/2017   ALT 20 02/19/2016   AST 22 02/19/2016   NA 140 03/29/2017   K 4.5 03/29/2017   CL 103 03/29/2017   CREATININE 1.01 03/29/2017   BUN 21 03/29/2017   CO2 29 03/29/2017   TSH 1.51 01/17/2016   PSA 1.55 12/26/2010   INR 2.3 03/03/2012   HGBA1C 8.0 (H) 03/29/2017    Ct Abdomen Pelvis W Contrast  Result Date: 02/20/2016 CLINICAL DATA:  Abdominal pain.  Prior small bowel obstruction EXAM: CT ABDOMEN AND PELVIS WITH CONTRAST TECHNIQUE: Multidetector CT imaging of the abdomen and pelvis was performed using the standard protocol following bolus administration of intravenous contrast. CONTRAST:  1 ISOVUE-300 IOPAMIDOL (ISOVUE-300) INJECTION 61% COMPARISON:  11/06/2015 FINDINGS: Lower chest and abdominal wall: Coronary atherosclerotic calcification. Small subpleural nodule along the left diaphragm is stable from prior and considered benign. Hepatobiliary: Probable hepatic steatosis. No focal lesion is seen.No evidence of biliary obstruction or stone. Pancreas: Unremarkable. Spleen: Unremarkable. Adrenals/Urinary Tract: Negative adrenals. No hydronephrosis or stone. Unremarkable bladder. Reproductive:Central prostate enlargement with nodule projecting into the bladder base. Stomach/Bowel: No obstruction. No inflammatory changes or evidence of appendicitis. There is asymmetric thickening of the left rectal wall which on coronal reformats appears ridge like and is likely from asymmetric underdistention. This area is low-density, convincing even when accounting for streak artifact left hip prosthesis. It is reassuring that no mass was seen in this region on colonoscopy last year (there was a 5 mm rectal polyp that was excised with benign pathology). Vascular/Lymphatic: No acute vascular abnormality. No mass or adenopathy. Peritoneal: No ascites or pneumoperitoneum. Musculoskeletal: Total left hip arthroplasty. No acute finding. Lumbar disc and facet degeneration with prior L4-5  surgery. IMPRESSION: 1. Negative for bowel obstruction or other cause of acute abdominal pain. 2. Mild rectal wall thickening, likely from underdistention, as above. Electronically Signed   By: Monte Fantasia M.D.   On: 02/20/2016 08:35    Assessment & Plan:   There are no diagnoses linked to this encounter. I am having Mr. Mertens maintain his cholecalciferol, docusate sodium, aspirin, simvastatin, metFORMIN, amLODipine, and metoprolol tartrate.  No orders of the defined types were placed in this encounter.    Follow-up: No Follow-up on file.  Walker Kehr, MD

## 2017-03-29 NOTE — Assessment & Plan Note (Signed)
On Metoprolol  and Norvasc 

## 2017-03-29 NOTE — Patient Instructions (Signed)
[facility] w/Jill 

## 2017-03-29 NOTE — Assessment & Plan Note (Signed)
Lopressor, ASA

## 2017-04-01 ENCOUNTER — Other Ambulatory Visit: Payer: Self-pay | Admitting: General Practice

## 2017-04-01 DIAGNOSIS — H401121 Primary open-angle glaucoma, left eye, mild stage: Secondary | ICD-10-CM | POA: Diagnosis not present

## 2017-04-01 DIAGNOSIS — H2512 Age-related nuclear cataract, left eye: Secondary | ICD-10-CM | POA: Diagnosis not present

## 2017-04-01 DIAGNOSIS — H5703 Miosis: Secondary | ICD-10-CM | POA: Diagnosis not present

## 2017-04-01 DIAGNOSIS — H2513 Age-related nuclear cataract, bilateral: Secondary | ICD-10-CM | POA: Diagnosis not present

## 2017-04-01 DIAGNOSIS — H25013 Cortical age-related cataract, bilateral: Secondary | ICD-10-CM | POA: Diagnosis not present

## 2017-04-07 ENCOUNTER — Encounter (HOSPITAL_COMMUNITY): Payer: Self-pay

## 2017-04-07 ENCOUNTER — Telehealth: Payer: Self-pay | Admitting: Internal Medicine

## 2017-04-07 ENCOUNTER — Emergency Department (HOSPITAL_COMMUNITY): Payer: Medicare Other

## 2017-04-07 ENCOUNTER — Emergency Department (HOSPITAL_COMMUNITY)
Admission: EM | Admit: 2017-04-07 | Discharge: 2017-04-07 | Disposition: A | Discharge status: Home / Self Care | Payer: Medicare Other | Attending: Emergency Medicine | Admitting: Emergency Medicine

## 2017-04-07 DIAGNOSIS — Z7984 Long term (current) use of oral hypoglycemic drugs: Secondary | ICD-10-CM | POA: Insufficient documentation

## 2017-04-07 DIAGNOSIS — K529 Noninfective gastroenteritis and colitis, unspecified: Secondary | ICD-10-CM | POA: Insufficient documentation

## 2017-04-07 DIAGNOSIS — E119 Type 2 diabetes mellitus without complications: Secondary | ICD-10-CM | POA: Diagnosis not present

## 2017-04-07 DIAGNOSIS — Z79899 Other long term (current) drug therapy: Secondary | ICD-10-CM | POA: Insufficient documentation

## 2017-04-07 DIAGNOSIS — Z951 Presence of aortocoronary bypass graft: Secondary | ICD-10-CM | POA: Diagnosis not present

## 2017-04-07 DIAGNOSIS — Z96642 Presence of left artificial hip joint: Secondary | ICD-10-CM | POA: Diagnosis not present

## 2017-04-07 DIAGNOSIS — R109 Unspecified abdominal pain: Secondary | ICD-10-CM | POA: Diagnosis not present

## 2017-04-07 DIAGNOSIS — R1033 Periumbilical pain: Secondary | ICD-10-CM | POA: Diagnosis present

## 2017-04-07 DIAGNOSIS — Z85828 Personal history of other malignant neoplasm of skin: Secondary | ICD-10-CM | POA: Insufficient documentation

## 2017-04-07 DIAGNOSIS — I2510 Atherosclerotic heart disease of native coronary artery without angina pectoris: Secondary | ICD-10-CM | POA: Diagnosis not present

## 2017-04-07 DIAGNOSIS — I10 Essential (primary) hypertension: Secondary | ICD-10-CM | POA: Diagnosis not present

## 2017-04-07 DIAGNOSIS — Z7982 Long term (current) use of aspirin: Secondary | ICD-10-CM | POA: Insufficient documentation

## 2017-04-07 DIAGNOSIS — Z87891 Personal history of nicotine dependence: Secondary | ICD-10-CM | POA: Diagnosis not present

## 2017-04-07 LAB — CBC
HCT: 42 % (ref 39.0–52.0)
HEMOGLOBIN: 14.9 g/dL (ref 13.0–17.0)
MCH: 31.7 pg (ref 26.0–34.0)
MCHC: 35.5 g/dL (ref 30.0–36.0)
MCV: 89.4 fL (ref 78.0–100.0)
Platelets: 239 10*3/uL (ref 150–400)
RBC: 4.7 MIL/uL (ref 4.22–5.81)
RDW: 12.6 % (ref 11.5–15.5)
WBC: 12 10*3/uL — ABNORMAL HIGH (ref 4.0–10.5)

## 2017-04-07 LAB — COMPREHENSIVE METABOLIC PANEL
ALT: 23 U/L (ref 17–63)
ANION GAP: 11 (ref 5–15)
AST: 27 U/L (ref 15–41)
Albumin: 4.5 g/dL (ref 3.5–5.0)
Alkaline Phosphatase: 42 U/L (ref 38–126)
BILIRUBIN TOTAL: 1.1 mg/dL (ref 0.3–1.2)
BUN: 18 mg/dL (ref 6–20)
CALCIUM: 9.5 mg/dL (ref 8.9–10.3)
CO2: 23 mmol/L (ref 22–32)
CREATININE: 0.98 mg/dL (ref 0.61–1.24)
Chloride: 101 mmol/L (ref 101–111)
GFR calc non Af Amer: >60 mL/min (ref >60)
GLUCOSE: 228 mg/dL — AB (ref 65–99)
Potassium: 3.9 mmol/L (ref 3.5–5.1)
Sodium: 135 mmol/L (ref 135–145)
Total Protein: 8.3 g/dL — ABNORMAL HIGH (ref 6.5–8.1)

## 2017-04-07 LAB — DIFFERENTIAL
Basophils Absolute: 0 10*3/uL (ref 0.0–0.1)
Basophils Relative: 0 %
EOS PCT: 0 %
Eosinophils Absolute: 0 10*3/uL (ref 0.0–0.7)
LYMPHS ABS: 1.1 10*3/uL (ref 0.7–4.0)
LYMPHS PCT: 10 %
Monocytes Absolute: 0.5 10*3/uL (ref 0.1–1.0)
Monocytes Relative: 4 %
NEUTROS ABS: 9.5 10*3/uL — AB (ref 1.7–7.7)
NEUTROS PCT: 86 %

## 2017-04-07 LAB — LIPASE, BLOOD: Lipase: 26 U/L (ref 11–51)

## 2017-04-07 MED ORDER — ONDANSETRON HCL 4 MG/2ML IJ SOLN
4.0000 mg | Freq: Once | INTRAMUSCULAR | Status: AC
  Administered 2017-04-07: 4 mg via INTRAVENOUS
  Filled 2017-04-07: qty 2

## 2017-04-07 MED ORDER — IOPAMIDOL (ISOVUE-300) INJECTION 61%
INTRAVENOUS | Status: AC
  Administered 2017-04-07: 100 mL via INTRAVENOUS
  Filled 2017-04-07: qty 100

## 2017-04-07 MED ORDER — HYDROCODONE-ACETAMINOPHEN 5-325 MG PO TABS: 2.0000 | ORAL_TABLET | ORAL | 0 refills | Status: DC | PRN

## 2017-04-07 MED ORDER — AMOXICILLIN-POT CLAVULANATE 875-125 MG PO TABS: 1.0000 | ORAL_TABLET | Freq: Two times a day (BID) | ORAL | 0 refills | Status: DC

## 2017-04-07 MED ORDER — MORPHINE SULFATE (PF) 2 MG/ML IV SOLN
4.0000 mg | Freq: Once | INTRAVENOUS | Status: AC
  Administered 2017-04-07: 4 mg via INTRAVENOUS
  Filled 2017-04-07: qty 2

## 2017-04-07 MED ORDER — SODIUM CHLORIDE 0.9 % IV SOLN
Freq: Once | INTRAVENOUS | Status: AC
  Administered 2017-04-07: 08:00:00 via INTRAVENOUS

## 2017-04-07 NOTE — ED Triage Notes (Addendum)
Pt reports 10/10 epigastric pain that began last night. Pt reports x1 epsiode of diarrhea this AM. Pt reports current nausea w/o emesis. Pt reports hx of bowel obstruction.

## 2017-04-07 NOTE — ED Notes (Signed)
Patient is alert and oriented x3.  He was given DC instructions and follow up visit instructions.  Patient gave verbal understanding.  He was DC ambulatory under his own power to home.  V/S stable.  He was not showing any signs of distress on DC 

## 2017-04-07 NOTE — ED Provider Notes (Signed)
[facility] DEPT Provider Note   CSN: [id number] Arrival date & time: 04/07/17  0620     History   Chief Complaint Chief Complaint  Patient presents with  . Abdominal Pain    HPI [name] is a 82 y.o. male.  The history is provided by the patient.  Abdominal Pain   This is a new problem. The current episode started 6 to 12 hours ago. The problem occurs hourly. The problem has not changed since onset.The pain is associated with an unknown factor. The pain is located in the periumbilical region. The quality of the pain is sharp and shooting. The pain is moderate. Associated symptoms include anorexia and belching. Pertinent negatives include fever, diarrhea, hematochezia, melena, vomiting, constipation, dysuria, frequency and hematuria. The symptoms are aggravated by palpation. Nothing relieves the symptoms.   82 year old male who presents with abdominal pain. He has a history of diabetes, CAD status post CABG, hypertension, and history partial small bowel obstruction. No prior abdominal surgeries. States sudden onset of sharp shooting periumbilical abdominal pain with distention at around 10 PM yesterday evening. Pain has been intermittent. He has not had vomiting but endorses nausea. A small bowel movement noted at 4 AM. No fevers or chills or urinary complaints. No chest pain or difficulty breathing. States that it somewhat feels similar to when he has had his obstruction in the past, but also has had similar symptoms without any findings on prior CT scans he states. Tried maalox, but no effect.   Past Medical History:  Diagnosis Date  . Arthritis   . Atrial fibrillation ([place])    post op; amiodarone and coumadin continued for 3 mos post op  . CARCINOMA, SKIN, SQUAMOUS CELL 01/07/2010  . Cataract    beginning of cataracts  . COLONIC POLYPS, HX OF 04/26/2007  . Coronary artery disease    s/p CABG 3/12: L-LAD, S-CFX (Dr. Roxy Manns); EF 45% at cath prior to CABG  . DIABETES  MELLITUS, TYPE II 04/26/2007  . Hyperlipidemia   . HYPERTENSION 04/26/2007  . NEPHROLITHIASIS, HX OF 04/26/2007  . SBO (small bowel obstruction) ([place]) 10/2015   Partial   . Shortness of breath dyspnea    with exertion  . Unspecified hearing loss 12/18/2008    Patient Active Problem List   Diagnosis Date Noted  . Constipation 02/18/2016  . Small bowel obstruction ([place]) 11/06/2015  . Back pain 05/08/2015  . Cough 10/17/2014  . Dyslipidemia 02/09/2011  . CAD (coronary artery disease), native coronary artery 02/09/2011  . Afib ([place]) 01/20/2011  . CARCINOMA, SKIN, SQUAMOUS CELL 01/07/2010  . UNSPECIFIED HEARING LOSS 12/18/2008  . Diabetes mellitus, type 2 ([place]) 04/26/2007  . Essential hypertension 04/26/2007  . COLONIC POLYPS, HX OF 04/26/2007  . NEPHROLITHIASIS, HX OF 04/26/2007    Past Surgical History:  Procedure Laterality Date  . BACK SURGERY     disectomy  . COLONOSCOPY    . CORONARY ARTERY BYPASS GRAFT  01/12/2011  . LUMBAR LAMINECTOMY/DECOMPRESSION MICRODISCECTOMY N/A 05/08/2015   Procedure: LUMBAR LAMINECTOMY/DECOMPRESSION MICRODISCECTOMY 1 LEVEL Lumbar four five;  Surgeon: Melina Schools, MD;  Location: [facility];  Service: Orthopedics;  Laterality: N/A;  . TOTAL HIP ARTHROPLASTY Left    left on 04/15/07  . TYMPANIC MEMBRANE REPAIR  1981       Home Medications    Prior to Admission medications   Medication Sig Start Date End Date Taking? Authorizing Provider  amLODipine (NORVASC) 5 MG tablet TAKE 1 TABLET (5 MG TOTAL) BY MOUTH DAILY. 02/18/17  Yes Fay Records, MD  aspirin 81 MG tablet Take 81 mg by mouth daily.    Yes [provider]  cholecalciferol (VITAMIN D) 1000 UNITS tablet Take 1,000 Units by mouth 2 (two) times daily.    Yes [provider]  ibuprofen (ADVIL,MOTRIN) 200 MG tablet Take 400 mg by mouth every 6 (six) hours as needed (pain).   Yes [provider]  metFORMIN (GLUCOPHAGE) 500 MG tablet TAKE 1 TABLET BY MOUTH 3 TIMES A DAY  11/18/16  Yes [name], Evie Lacks, MD  metoprolol tartrate (LOPRESSOR) 25 MG tablet TAKE 1 TABLET BY MOUTH EVERY DAY 03/01/17  Yes Fay Records, MD  repaglinide (PRANDIN) 1 MG tablet Take 1 tablet (1 mg total) by mouth 3 (three) times daily before meals. 03/29/17  Yes [name], Evie Lacks, MD  simvastatin (ZOCOR) 20 MG tablet TAKE 1/2 TABLET BY MOUTH EVERY DAY AT BEDTIME 03/27/16  Yes Fay Records, MD  amoxicillin-clavulanate (AUGMENTIN) 875-125 MG tablet Take 1 tablet by mouth every 12 (twelve) hours. 04/07/17   Forde Dandy, MD  HYDROcodone-acetaminophen (NORCO/VICODIN) 5-325 MG tablet Take 2 tablets by mouth every 4 (four) hours as needed. 04/07/17   Forde Dandy, MD    Family History Family History  Problem Relation Age of Onset  . Heart failure Mother   . Heart disease Father   . Cancer Sister   . Cancer Brother   . Heart disease Brother   . Breast cancer Sister   . Colon cancer Neg Hx     Social History Social History  Substance Use Topics  . Smoking status: Former Smoker    Quit date: 01/02/1981  . Smokeless tobacco: Never Used  . Alcohol use No     Allergies   Patient has no known allergies.   Review of Systems Review of Systems  Constitutional: Negative for fever.  Respiratory: Negative for shortness of breath.   Cardiovascular: Negative for chest pain.  Gastrointestinal: Positive for abdominal pain and anorexia. Negative for constipation, diarrhea, hematochezia, melena and vomiting.  Genitourinary: Negative for dysuria, frequency and hematuria.  Hematological: Does not bruise/bleed easily.  All other systems reviewed and are negative.    Physical Exam Updated Vital Signs BP (!) 145/84 (BP Location: Left Arm)   Pulse 93   Temp 98.6 F (37 C) (Oral)   Resp 16   SpO2 94%   Physical Exam Physical Exam  Nursing note and vitals reviewed. Constitutional:  non-toxic, and in no acute distress Head: Normocephalic and atraumatic.  Mouth/Throat: Oropharynx is  clear and moist.  Neck: Normal range of motion. Neck supple.  Cardiovascular: Normal rate and regular rhythm.   Pulmonary/Chest: Effort normal and breath sounds normal.  Abdominal: Soft. Minimal distension. There is Periumbilical tenderness. There is no rebound and no guarding.  Musculoskeletal: Normal range of motion.  Neurological: Alert, no facial droop, fluent speech, moves all extremities symmetrically Skin: Skin is warm and dry.  Psychiatric: Cooperative   ED Treatments / Results  Labs (all labs ordered are listed, but only abnormal results are displayed) Labs Reviewed  COMPREHENSIVE METABOLIC PANEL - Abnormal; Notable for the following:       Result Value   Glucose, Bld 228 (*)    Total Protein 8.3 (*)    All other components within normal limits  CBC - Abnormal; Notable for the following:    WBC 12.0 (*)    All other components within normal limits  DIFFERENTIAL - Abnormal; Notable for the following:  Neutro Abs 9.5 (*)    All other components within normal limits  LIPASE, BLOOD  URINALYSIS, ROUTINE W REFLEX MICROSCOPIC    EKG  EKG Interpretation None       Radiology Ct Abdomen Pelvis W Contrast  Result Date: 04/07/2017 CLINICAL DATA:  Periumbilical abdominal pain since 10 a.m. this morning. EXAM: CT ABDOMEN AND PELVIS WITH CONTRAST TECHNIQUE: Multidetector CT imaging of the abdomen and pelvis was performed using the standard protocol following bolus administration of intravenous contrast. CONTRAST:  Does not available, reference EMR. COMPARISON:  02/20/2016 FINDINGS: Lower chest:  No contributory findings. Hepatobiliary: Equivocal for hepatic steatosis. No focal hepatic lesion.No evidence of biliary obstruction or stone. Pancreas: Unremarkable. Spleen: Unremarkable. Adrenals/Urinary Tract: Negative adrenals. Two punctate right renal calculi. No hydronephrosis. Unremarkable bladder. Stomach/Bowel: A segment of ileum shows probable wall thickening from submucosal  edema, but this segment is under distended. There is some fecalization/slow transit seen in ileal loops. No transition point to suggest bowel obstruction. No appendicitis. Rare colonic diverticula. Vascular/Lymphatic: No acute vascular abnormality. Aortic and branch vessel atherosclerosis. Bulky plaque at both the celiac and SMA origins. No acute vascular finding. No mass or adenopathy. Reproductive:No acute finding. Incidental dystrophic calcifications in the prostate. Other: Trace peritoneal fluid seen in the right pericolic gutter, favored reactive. No pneumoperitoneum. Mild haziness of central mesenteric fat is chronic and incidental. No associated adenopathy or mass. Musculoskeletal: Diffuse lumbar disc degeneration and facet arthropathy. Spinal stenosis at L3-4 and L4-5. Left hip arthroplasty. No acute superimposed finding. IMPRESSION: 1. Question mild enteritis. 2. Tiny right renal calculi. 3.  Aortic Atherosclerosis (ICD10-I70.0). Electronically Signed   By: Monte Fantasia M.D.   On: 04/07/2017 08:34    Procedures Procedures (including critical care time)  Medications Ordered in ED Medications  morphine 2 MG/ML injection 4 mg (4 mg Intravenous Given 04/07/17 0734)  ondansetron (ZOFRAN) injection 4 mg (4 mg Intravenous Given 04/07/17 0734)  0.9 %  sodium chloride infusion ( Intravenous New Bag/Given 04/07/17 0734)  iopamidol (ISOVUE-300) 61 % injection (100 mLs Intravenous Contrast Given 04/07/17 0757)     Initial Impression / Assessment and Plan / ED Course  I have reviewed the triage vital signs and the nursing notes.  Pertinent labs & imaging results that were available during my care of the patient were reviewed by me and considered in my medical decision making (see chart for details).     Differential includes SBO, ileus, early appendicitis, pancreatitis, colitis or enteritis.His abdomen is soft and nonsurgical, and he has normal vital signs. He is not ill-appearing or toxic.  His  blood work shows mild leukocytosis of 12. His CT scan is visualized, and shows evidence of possible mild enteritis. No other acute intra-abdominal process is noted by radiology. His pain and symptoms have been well controlled. No systemic signs or symptoms of illness. He feels comfortable managing symptoms from home. We'll discharge home with analgesics as well as course of augmentin given age for possible enteritis. Strict return and follow-up instructions reviewed. He expressed understanding of all discharge instructions and felt comfortable with the plan of care.   Final Clinical Impressions(s) / ED Diagnoses   Final diagnoses:  Enteritis    New Prescriptions New Prescriptions   AMOXICILLIN-CLAVULANATE (AUGMENTIN) 875-125 MG TABLET    Take 1 tablet by mouth every 12 (twelve) hours.   HYDROCODONE-ACETAMINOPHEN (NORCO/VICODIN) 5-325 MG TABLET    Take 2 tablets by mouth every 4 (four) hours as needed.     Forde Dandy, MD  04/07/17 0853  

## 2017-04-07 NOTE — Telephone Encounter (Signed)
Pt would like a Rx for a BS monitor and test strips and alcohol swabs sent into [facility]. He has been using his wifes but would like his own.

## 2017-04-07 NOTE — Discharge Instructions (Signed)
Your CT shows enteritis, which is mild inflammation in the small bowel that could be related to infection. Please take antibiotics as prescribed. Please return for worsening symptoms, including fever, worsening pain, intractable vomiting, bloody stools or any other symptoms concerning to you. The pain medication you are prescribed, may cause constipation. Please take Miralax or other stool softener with this to avoid constipation.   Please call your PCP and follow-up closely for re-evaluation.

## 2017-04-08 ENCOUNTER — Other Ambulatory Visit: Payer: Self-pay

## 2017-04-08 MED ORDER — METFORMIN HCL 500 MG PO TABS: 500.0000 mg | ORAL_TABLET | Freq: Three times a day (TID) | ORAL | 1 refills | Status: DC

## 2017-04-08 MED ORDER — GLUCOSE BLOOD VI STRP: 1.0000 | ORAL_STRIP | Freq: Two times a day (BID) | 1 refills | Status: DC

## 2017-04-08 MED ORDER — ACCU-CHEK AVIVA PLUS W/DEVICE KIT: PACK | 0 refills | Status: DC

## 2017-04-08 MED ORDER — ACCU-CHEK FASTCLIX LANCETS MISC: 1 refills | Status: DC

## 2017-04-08 NOTE — Telephone Encounter (Signed)
Sent [facility] for BS monitor w/supplies...[name]

## 2017-06-02 DIAGNOSIS — H25812 Combined forms of age-related cataract, left eye: Secondary | ICD-10-CM | POA: Diagnosis not present

## 2017-06-02 DIAGNOSIS — H2512 Age-related nuclear cataract, left eye: Secondary | ICD-10-CM | POA: Diagnosis not present

## 2017-06-02 DIAGNOSIS — H2181 Floppy iris syndrome: Secondary | ICD-10-CM | POA: Diagnosis not present

## 2017-06-02 DIAGNOSIS — H401121 Primary open-angle glaucoma, left eye, mild stage: Secondary | ICD-10-CM | POA: Diagnosis not present

## 2017-06-16 ENCOUNTER — Other Ambulatory Visit: Payer: Self-pay | Admitting: Internal Medicine

## 2017-07-01 ENCOUNTER — Emergency Department (HOSPITAL_COMMUNITY)
Admission: EM | Admit: 2017-07-01 | Discharge: 2017-07-01 | Disposition: A | Discharge status: Home / Self Care | Payer: Medicare Other | Attending: Emergency Medicine | Admitting: Emergency Medicine

## 2017-07-01 ENCOUNTER — Emergency Department (HOSPITAL_COMMUNITY): Payer: Medicare Other

## 2017-07-01 ENCOUNTER — Encounter (HOSPITAL_COMMUNITY): Payer: Self-pay

## 2017-07-01 DIAGNOSIS — Z951 Presence of aortocoronary bypass graft: Secondary | ICD-10-CM | POA: Insufficient documentation

## 2017-07-01 DIAGNOSIS — R109 Unspecified abdominal pain: Secondary | ICD-10-CM | POA: Diagnosis not present

## 2017-07-01 DIAGNOSIS — E119 Type 2 diabetes mellitus without complications: Secondary | ICD-10-CM | POA: Insufficient documentation

## 2017-07-01 DIAGNOSIS — Z7982 Long term (current) use of aspirin: Secondary | ICD-10-CM | POA: Insufficient documentation

## 2017-07-01 DIAGNOSIS — Z96642 Presence of left artificial hip joint: Secondary | ICD-10-CM | POA: Diagnosis not present

## 2017-07-01 DIAGNOSIS — K529 Noninfective gastroenteritis and colitis, unspecified: Secondary | ICD-10-CM | POA: Insufficient documentation

## 2017-07-01 DIAGNOSIS — Z87891 Personal history of nicotine dependence: Secondary | ICD-10-CM | POA: Insufficient documentation

## 2017-07-01 DIAGNOSIS — I2510 Atherosclerotic heart disease of native coronary artery without angina pectoris: Secondary | ICD-10-CM | POA: Insufficient documentation

## 2017-07-01 DIAGNOSIS — Z7984 Long term (current) use of oral hypoglycemic drugs: Secondary | ICD-10-CM | POA: Diagnosis not present

## 2017-07-01 DIAGNOSIS — I10 Essential (primary) hypertension: Secondary | ICD-10-CM | POA: Insufficient documentation

## 2017-07-01 DIAGNOSIS — R1033 Periumbilical pain: Secondary | ICD-10-CM | POA: Diagnosis present

## 2017-07-01 LAB — URINALYSIS, ROUTINE W REFLEX MICROSCOPIC
Bacteria, UA: NONE SEEN
Bilirubin Urine: NEGATIVE
Glucose, UA: NEGATIVE mg/dL
Hgb urine dipstick: NEGATIVE
Ketones, ur: NEGATIVE mg/dL
Leukocytes, UA: NEGATIVE
Nitrite: NEGATIVE
Protein, ur: NEGATIVE mg/dL
Specific Gravity, Urine: 1.016 (ref 1.005–1.030)
Squamous Epithelial / HPF: NONE SEEN
WBC, UA: NONE SEEN WBC/hpf (ref 0–5)
pH: 7 (ref 5.0–8.0)

## 2017-07-01 LAB — CBC WITH DIFFERENTIAL/PLATELET
Basophils Absolute: 0 10*3/uL (ref 0.0–0.1)
Basophils Relative: 0 %
Eosinophils Absolute: 0.1 10*3/uL (ref 0.0–0.7)
Eosinophils Relative: 1 %
HEMATOCRIT: 41.6 % (ref 39.0–52.0)
Hemoglobin: 14.4 g/dL (ref 13.0–17.0)
LYMPHS ABS: 1.6 10*3/uL (ref 0.7–4.0)
Lymphocytes Relative: 17 %
MCH: 31.4 pg (ref 26.0–34.0)
MCHC: 34.6 g/dL (ref 30.0–36.0)
MCV: 90.6 fL (ref 78.0–100.0)
MONOS PCT: 7 %
Monocytes Absolute: 0.7 10*3/uL (ref 0.1–1.0)
NEUTROS ABS: 7 10*3/uL (ref 1.7–7.7)
NEUTROS PCT: 75 %
Platelets: 234 10*3/uL (ref 150–400)
RBC: 4.59 MIL/uL (ref 4.22–5.81)
RDW: 12.6 % (ref 11.5–15.5)
WBC: 9.5 10*3/uL (ref 4.0–10.5)

## 2017-07-01 LAB — COMPREHENSIVE METABOLIC PANEL
ALK PHOS: 38 U/L (ref 38–126)
ALT: 24 U/L (ref 17–63)
ANION GAP: 12 (ref 5–15)
AST: 25 U/L (ref 15–41)
Albumin: 4.1 g/dL (ref 3.5–5.0)
BILIRUBIN TOTAL: 0.9 mg/dL (ref 0.3–1.2)
BUN: 22 mg/dL — ABNORMAL HIGH (ref 6–20)
CALCIUM: 9.6 mg/dL (ref 8.9–10.3)
CO2: 24 mmol/L (ref 22–32)
Chloride: 106 mmol/L (ref 101–111)
Creatinine, Ser: 0.88 mg/dL (ref 0.61–1.24)
GFR calc non Af Amer: >60 mL/min (ref >60)
Glucose, Bld: 169 mg/dL — ABNORMAL HIGH (ref 65–99)
Potassium: 3.6 mmol/L (ref 3.5–5.1)
Sodium: 142 mmol/L (ref 135–145)
TOTAL PROTEIN: 7.3 g/dL (ref 6.5–8.1)

## 2017-07-01 LAB — I-STAT CG4 LACTIC ACID, ED: LACTIC ACID, VENOUS: 2.57 mmol/L — AB (ref 0.5–1.9)

## 2017-07-01 LAB — LIPASE, BLOOD: Lipase: 33 U/L (ref 11–51)

## 2017-07-01 MED ORDER — ONDANSETRON HCL 4 MG PO TABS: 4.0000 mg | ORAL_TABLET | Freq: Three times a day (TID) | ORAL | 0 refills | Status: DC | PRN

## 2017-07-01 MED ORDER — SODIUM CHLORIDE 0.9 % IV BOLUS (SEPSIS)
500.0000 mL | Freq: Once | INTRAVENOUS | Status: AC
  Administered 2017-07-01: 500 mL via INTRAVENOUS

## 2017-07-01 MED ORDER — AMOXICILLIN-POT CLAVULANATE 875-125 MG PO TABS: 1.0000 | ORAL_TABLET | Freq: Two times a day (BID) | ORAL | 0 refills | Status: DC

## 2017-07-01 MED ORDER — SODIUM CHLORIDE 0.9 % IV SOLN
1000.0000 mL | INTRAVENOUS | Status: DC
  Administered 2017-07-01: 1000 mL via INTRAVENOUS

## 2017-07-01 MED ORDER — MORPHINE SULFATE (PF) 2 MG/ML IV SOLN
4.0000 mg | Freq: Once | INTRAVENOUS | Status: AC
  Administered 2017-07-01: 4 mg via INTRAVENOUS
  Filled 2017-07-01: qty 2

## 2017-07-01 MED ORDER — ONDANSETRON HCL 4 MG/2ML IJ SOLN
4.0000 mg | Freq: Once | INTRAMUSCULAR | Status: AC
  Administered 2017-07-01: 4 mg via INTRAVENOUS
  Filled 2017-07-01: qty 2

## 2017-07-01 MED ORDER — HYDROCODONE-ACETAMINOPHEN 5-325 MG PO TABS: 1.0000 | ORAL_TABLET | Freq: Four times a day (QID) | ORAL | 0 refills | Status: DC | PRN

## 2017-07-01 MED ORDER — IOPAMIDOL (ISOVUE-300) INJECTION 61%
100.0000 mL | Freq: Once | INTRAVENOUS | Status: AC | PRN
Start: 2017-07-01 — End: 2017-07-01
  Administered 2017-07-01: 100 mL via INTRAVENOUS

## 2017-07-01 MED ORDER — IOPAMIDOL (ISOVUE-300) INJECTION 61%
INTRAVENOUS | Status: AC
  Filled 2017-07-01: qty 100

## 2017-07-01 NOTE — ED Notes (Signed)
Tolerated po intake

## 2017-07-01 NOTE — ED Triage Notes (Signed)
Pt complains of abdominal pain since yesterday, no vomiting or diarrhea Pt states he is nauseated

## 2017-07-01 NOTE — ED Notes (Signed)
Patient transported to CT 

## 2017-07-01 NOTE — ED Provider Notes (Signed)
[facility] DEPT Provider Note   CSN: [id number] Arrival date & time: 07/01/17  0501     History   Chief Complaint Chief Complaint  Patient presents with  . Abdominal Pain    HPI [name] is a 82 y.o. male presenting with abdominal pain.  Patient states that his abdominal pain began at 10:00 last night when he was watching TV. The pain is periumbilical, and comes and goes. Nothing he does makes it better or worse. The pain is described as feeling like a sharp ache. Patient reports poor sleep due to pain last night. Associated symptoms include nausea without vomiting. States he has a history of similar pain, once diagnosed with partial extraction, and last time diagnosis of enteritis. He denies fever, chills, chest pain, shortness of breath, urinary symptoms, normal bowel movements, or abdominal distention. Last bowel movement was yesterday morning, and was normal. He has not had anything to eat or drink since the pain started. He has been passing gas without change in the pain. He does not report increased belching. He denies history of abdominal surgeries. Past medical history includes GERD, for which he intermittently takes medications, and he states he can control it if he controls his diet, but he's not been doing so recently.  HPI  Past Medical History:  Diagnosis Date  . Arthritis   . Atrial fibrillation ([place])    post op; amiodarone and coumadin continued for 3 mos post op  . CARCINOMA, SKIN, SQUAMOUS CELL 01/07/2010  . Cataract    beginning of cataracts  . COLONIC POLYPS, HX OF 04/26/2007  . Coronary artery disease    s/p CABG 3/12: L-LAD, S-CFX (Dr. Roxy Manns); EF 45% at cath prior to CABG  . DIABETES MELLITUS, TYPE II 04/26/2007  . Hyperlipidemia   . HYPERTENSION 04/26/2007  . NEPHROLITHIASIS, HX OF 04/26/2007  . SBO (small bowel obstruction) ([place]) 10/2015   Partial   . Shortness of breath dyspnea    with exertion  . Unspecified hearing loss 12/18/2008     Patient Active Problem List   Diagnosis Date Noted  . Constipation 02/18/2016  . Small bowel obstruction ([place]) 11/06/2015  . Back pain 05/08/2015  . Cough 10/17/2014  . Dyslipidemia 02/09/2011  . CAD (coronary artery disease), native coronary artery 02/09/2011  . Afib ([place]) 01/20/2011  . CARCINOMA, SKIN, SQUAMOUS CELL 01/07/2010  . UNSPECIFIED HEARING LOSS 12/18/2008  . Diabetes mellitus, type 2 ([place]) 04/26/2007  . Essential hypertension 04/26/2007  . COLONIC POLYPS, HX OF 04/26/2007  . NEPHROLITHIASIS, HX OF 04/26/2007    Past Surgical History:  Procedure Laterality Date  . BACK SURGERY     disectomy  . COLONOSCOPY    . CORONARY ARTERY BYPASS GRAFT  01/12/2011  . LUMBAR LAMINECTOMY/DECOMPRESSION MICRODISCECTOMY N/A 05/08/2015   Procedure: LUMBAR LAMINECTOMY/DECOMPRESSION MICRODISCECTOMY 1 LEVEL Lumbar four five;  Surgeon: Melina Schools, MD;  Location: [facility];  Service: Orthopedics;  Laterality: N/A;  . TOTAL HIP ARTHROPLASTY Left    left on 04/15/07  . TYMPANIC MEMBRANE REPAIR  1981       Home Medications    Prior to Admission medications   Medication Sig Start Date End Date Taking? Authorizing Provider  amLODipine (NORVASC) 5 MG tablet TAKE 1 TABLET (5 MG TOTAL) BY MOUTH DAILY. 02/18/17  Yes Fay Records, MD  aspirin 81 MG tablet Take 81 mg by mouth daily.    Yes [provider]  cholecalciferol (VITAMIN D) 1000 UNITS tablet Take 1,000 Units by mouth 2 (two)  times daily.    Yes [provider]  ibuprofen (ADVIL,MOTRIN) 200 MG tablet Take 400 mg by mouth every 6 (six) hours as needed (pain).   Yes [provider]  metFORMIN (GLUCOPHAGE) 500 MG tablet Take 1 tablet (500 mg total) by mouth 3 (three) times daily. 04/08/17  Yes [name], Evie Lacks, MD  metoprolol tartrate (LOPRESSOR) 25 MG tablet TAKE 1 TABLET BY MOUTH EVERY DAY 03/01/17  Yes Fay Records, MD  repaglinide (PRANDIN) 1 MG tablet Take 1 tablet (1 mg total) by mouth 3 (three) times  daily before meals. 03/29/17  Yes [name], Evie Lacks, MD  simvastatin (ZOCOR) 20 MG tablet TAKE 1/2 TABLET BY MOUTH EVERY DAY AT BEDTIME 06/16/17  Yes Fay Records, MD  ACCU-CHEK FASTCLIX LANCETS MISC Use to help check blood sugars twice a day 04/08/17   [name], Evie Lacks, MD  amoxicillin-clavulanate (AUGMENTIN) 875-125 MG tablet Take 1 tablet by mouth every 12 (twelve) hours. 07/01/17   Jacques Fife, PA-C  Blood Glucose Monitoring Suppl (ACCU-CHEK AVIVA PLUS) w/Device KIT Use as directed 04/08/17   [name], Evie Lacks, MD  glucose blood (ACCU-CHEK AVIVA PLUS) test strip 1 each by Other route 2 (two) times daily. Use to check blood sugars twice a day 04/08/17   [name], Evie Lacks, MD  HYDROcodone-acetaminophen (NORCO/VICODIN) 5-325 MG tablet Take 1 tablet by mouth every 6 (six) hours as needed for severe pain. 07/01/17   Jaziyah Gradel, PA-C  ondansetron (ZOFRAN) 4 MG tablet Take 1 tablet (4 mg total) by mouth every 8 (eight) hours as needed for nausea or vomiting. 07/01/17   Miroslava Santellan, PA-C    Family History Family History  Problem Relation Age of Onset  . Heart failure Mother   . Heart disease Father   . Cancer Sister   . Cancer Brother   . Heart disease Brother   . Breast cancer Sister   . Colon cancer Neg Hx     Social History Social History  Substance Use Topics  . Smoking status: Former Smoker    Quit date: 01/02/1981  . Smokeless tobacco: Never Used  . Alcohol use No     Allergies   Patient has no known allergies.   Review of Systems Review of Systems  Gastrointestinal: Positive for abdominal pain and nausea.  All other systems reviewed and are negative.    Physical Exam Updated Vital Signs BP 138/78   Pulse 75   Temp 98 F (36.7 C) (Oral)   Resp 20   SpO2 91%   Physical Exam  Constitutional: He is oriented to person, place, and time. He appears well-developed and well-nourished. No distress.  HENT:  Head: Normocephalic and atraumatic.   Eyes: Pupils are equal, round, and reactive to light. EOM are normal.  Neck: Normal range of motion.  Cardiovascular: Normal rate, regular rhythm and intact distal pulses.   Pulmonary/Chest: Effort normal and breath sounds normal. No respiratory distress. He has no wheezes.  Abdominal: Soft. Bowel sounds are normal. He exhibits no distension and no mass. There is no tenderness. There is no rigidity, no rebound, no guarding, no tenderness at McBurney's point and negative Murphy's sign.  Patient reports tenderness to palpation of periumbilical area. No obvious tenderness or severe pain.  Musculoskeletal: Normal range of motion.  Neurological: He is alert and oriented to person, place, and time.  Skin: Skin is warm and dry. He is not diaphoretic.  Psychiatric: He has a normal mood and affect.  Nursing note and vitals  reviewed.    ED Treatments / Results  Labs (all labs ordered are listed, but only abnormal results are displayed) Labs Reviewed  COMPREHENSIVE METABOLIC PANEL - Abnormal; Notable for the following:       Result Value   Glucose, Bld 169 (*)    BUN 22 (*)    All other components within normal limits  URINALYSIS, ROUTINE W REFLEX MICROSCOPIC - Abnormal; Notable for the following:    APPearance TURBID (*)    All other components within normal limits  I-STAT CG4 LACTIC ACID, ED - Abnormal; Notable for the following:    Lactic Acid, Venous 2.57 (*)    All other components within normal limits  CBC WITH DIFFERENTIAL/PLATELET  LIPASE, BLOOD    EKG  EKG Interpretation None       Radiology Ct Abdomen Pelvis W Contrast  Result Date: 07/01/2017 CLINICAL DATA:  Mid abdominal pain since yesterday.  Nausea. EXAM: CT ABDOMEN AND PELVIS WITH CONTRAST TECHNIQUE: Multidetector CT imaging of the abdomen and pelvis was performed using the standard protocol following bolus administration of intravenous contrast. CONTRAST:  164m ISOVUE-300 IOPAMIDOL (ISOVUE-300) INJECTION 61%  COMPARISON:  CT scan 04/07/2017 FINDINGS: Lower chest: The lung bases are clear of an acute process. Stable basilar scarring changes and stable 7 mm nodule at the left lung base. No change since 2016. The heart is normal in size and stable. Coronary artery calcifications are noted. The distal esophagus is grossly normal. Hepatobiliary: No focal hepatic lesions or intrahepatic biliary dilatation. The gallbladder is normal. No common bile duct dilatation. Pancreas: No mass, inflammation or ductal dilatation. Spleen: Normal size. No focal lesions. Small accessory spleen noted. Adrenals/Urinary Tract: The adrenal glands and kidneys are normal. No renal, ureteral or bladder calculi or mass. No collecting system abnormalities on the delayed images. Stomach/Bowel: The stomach, duodenum and proximal small bowel appear normal. The mid small bowel is fluid-filled but not significantly dilated. In the right lower quadrant there is an inflamed small bowel loop with mucosal and serosal enhancement and submucosal edema and bowel wall thickening. This extends for several cm and transitions to normal distal small bowel. The terminal ileum appears normal. The colon is unremarkable. Scattered diverticulosis but no acute inflammatory changes, mass lesions or obstructive findings. The appendix is normal. Vascular/Lymphatic: Stable atherosclerotic calcifications involving the aorta and branch vessel ostia. Dense calcifications are noted at the origins of the celiac axis and SMA but the SMA and branch vessels are patent and I do not think this is ischemic change. Dense bilateral renal artery calcifications at their origins. The IMA is patent. No calcifications. No mesenteric or retroperitoneal mass or adenopathy. Reproductive: The prostate gland and seminal vesicles are unremarkable. Other: No pelvic mass or adenopathy. No free pelvic fluid collections. No inguinal mass or adenopathy. No abdominal wall hernia or subcutaneous lesions.  Musculoskeletal: No significant bony findings. Advanced degenerative changes involving the lower lumbar spine. 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 IMPRESSION: 1. Focal/localized enteritis involving the mid distal small bowel in the right abdomen. There is mucosal and serosal enhancement, submucosal edema and bowel wall thickening. This is likely inflammatory or infectious process. Although there is fairly significant atherosclerotic calcifications of the origin of the SMA, the branch vessels are patent and the IMA is patent and I do not think this is ischemic. Patient had similar but not as pronounced findings on the prior CT scan from May 2018. 2. No other significant abdominal/pelvic findings. 3. Age related atherosclerotic calcifications involving  the coronary arteries, abdominal aorta and branch vessels. Electronically Signed   By: Marijo Sanes M.D.   On: 07/01/2017 08:45    Procedures Procedures (including critical care time)  Medications Ordered in ED Medications  sodium chloride 0.9 % bolus 500 mL (0 mLs Intravenous Stopped 07/01/17 0720)    Followed by  0.9 %  sodium chloride infusion (1,000 mLs Intravenous New Bag/Given 07/01/17 0720)  iopamidol (ISOVUE-300) 61 % injection (not administered)  ondansetron (ZOFRAN) injection 4 mg (4 mg Intravenous Given 07/01/17 0639)  morphine 2 MG/ML injection 4 mg (4 mg Intravenous Given 07/01/17 0638)  iopamidol (ISOVUE-300) 61 % injection 100 mL (100 mLs Intravenous Contrast Given 07/01/17 0807)     Initial Impression / Assessment and Plan / ED Course  I have reviewed the triage vital signs and the nursing notes.  Pertinent labs & imaging results that were available during my care of the patient were reviewed by me and considered in my medical decision making (see chart for details).     Patient presented with acute onset abdominal pain last night. Associated symptoms include nausea. Patient with history of similar abdominal  pain, once with partial obstruction, and once with enteritis. Physical exam reassuring, as patient does not appear distressed, and vital signs are stable. Minimal tenderness to palpation. Doubt intra-abdominal infection at this time. Will order basic abdominal labs, urine, and CT for further evaluation. Zofran, IV fluids, and morphine for symptom control. Case discussed with attending, Dr. Kathrynn Humble evaluated patient.  Labs reassuring, patient without a white count, and normal electrolytes. Discussed lactic acid level with attending, and will ensure no ischemic bowel or obstruction is causing the elevation. UA shows no sign of infection.  CT shows localized enteritis of the small bowel, likely inflammatory or infectious, doubt ischemic bowel. On reassessment, patient reports his pain is significantly improved, and he is no longer nauseous. Will try PO challenge, and if patient tolerates well, will discharge with prescription for Zofran, antibiotics, and pain medication. Patient to follow-up with primary care for further evaluation and possible GI referral. At this time, patient appears safe for discharge. Return precautions given. Patient states he understands and agrees to plan.  Final Clinical Impressions(s) / ED Diagnoses   Final diagnoses:  Enteritis    New Prescriptions New Prescriptions   ONDANSETRON (ZOFRAN) 4 MG TABLET    Take 1 tablet (4 mg total) by mouth every 8 (eight) hours as needed for nausea or vomiting.     Franchot Heidelberg, PA-C 07/01/17 [place], [place], MD 07/01/17 [phone]

## 2017-07-01 NOTE — ED Notes (Signed)
EDPA Provider at bedside. 

## 2017-07-01 NOTE — Discharge Instructions (Signed)
Take antibiotics as prescribed. You may use Norco as needed for severe pain. Take Tylenol as needed for mild to moderate pain. Use Zofran as needed for nausea or vomiting. Try to eat a bland diet in the next several days, avoiding spicy, acidic, or fatty foods.  Continue to take your at-home medications. Follow-up with your primary care provider within a week for further evaluation of your pain. Return to the emergency room if you develop fever, chills, persistent vomiting, significantly worsening pain, inability to pass gas or have a bowel movement, or any new or worsening symptoms.

## 2017-07-01 NOTE — ED Notes (Signed)
Abnormal lab result MD Kathrynn Humble have been made aware

## 2017-07-02 ENCOUNTER — Other Ambulatory Visit (INDEPENDENT_AMBULATORY_CARE_PROVIDER_SITE_OTHER): Payer: Medicare Other

## 2017-07-02 ENCOUNTER — Encounter: Payer: Self-pay | Admitting: Internal Medicine

## 2017-07-02 ENCOUNTER — Ambulatory Visit (INDEPENDENT_AMBULATORY_CARE_PROVIDER_SITE_OTHER): Payer: Medicare Other | Admitting: Internal Medicine

## 2017-07-02 VITALS — BP 128/62 | HR 75 | Temp 98.6°F | Ht 68.0 in | Wt 238.2 lb

## 2017-07-02 DIAGNOSIS — R1033 Periumbilical pain: Secondary | ICD-10-CM

## 2017-07-02 DIAGNOSIS — R109 Unspecified abdominal pain: Secondary | ICD-10-CM | POA: Insufficient documentation

## 2017-07-02 DIAGNOSIS — N320 Bladder-neck obstruction: Secondary | ICD-10-CM

## 2017-07-02 DIAGNOSIS — Z0000 Encounter for general adult medical examination without abnormal findings: Secondary | ICD-10-CM

## 2017-07-02 DIAGNOSIS — I480 Paroxysmal atrial fibrillation: Secondary | ICD-10-CM | POA: Diagnosis not present

## 2017-07-02 DIAGNOSIS — E1159 Type 2 diabetes mellitus with other circulatory complications: Secondary | ICD-10-CM | POA: Diagnosis not present

## 2017-07-02 LAB — PSA: PSA: 1.18 ng/mL (ref 0.10–4.00)

## 2017-07-02 LAB — TSH: TSH: 2.05 u[IU]/mL (ref 0.35–4.50)

## 2017-07-02 LAB — HEMOGLOBIN A1C: Hgb A1c MFr Bld: 6.2 % (ref 4.6–6.5)

## 2017-07-02 MED ORDER — ZOSTER VAC RECOMB ADJUVANTED 50 MCG/0.5ML IM SUSR: 0.5000 mL | Freq: Once | INTRAMUSCULAR | 1 refills | Status: DC

## 2017-07-02 MED ORDER — ZOSTER VAC RECOMB ADJUVANTED 50 MCG/0.5ML IM SUSR: 0.5000 mL | Freq: Once | INTRAMUSCULAR | 1 refills | Status: AC

## 2017-07-02 NOTE — Progress Notes (Signed)
Pre visit review using our clinic review tool, if applicable. No additional management support is needed unless otherwise documented below in the visit note. 

## 2017-07-02 NOTE — Patient Instructions (Addendum)
Continue doing brain stimulating activities (puzzles, reading, adult coloring books, staying active) to keep memory sharp.   Start to eat heart healthy diet (full of fruits, vegetables, whole grains, lean protein, water--limit salt, fat, and sugar intake) and increase physical activity as tolerated.  [name] , Thank you for taking time to come for your Medicare Wellness Visit. I appreciate your ongoing commitment to your health goals. Please review the following plan we discussed and let me know if I can assist you in the future.   These are the goals we discussed: Goals    . Increase the amount that I walk          I will get up go to the [place] and walk once a week       This is a list of the screening recommended for you and due dates:  Health Maintenance  Topic Date Due  . Urine Protein Check  01/25/1945  . Complete foot exam   11/16/2012  . Flu Shot  06/16/2017  . Eye exam for diabetics  03/25/2018  . Tetanus Vaccine  12/18/2018  . Pneumonia vaccines  Completed    Carbohydrate Counting for Diabetes Mellitus, Adult Carbohydrate counting is a method for keeping track of how many carbohydrates you eat. Eating carbohydrates naturally increases the amount of sugar (glucose) in the blood. Counting how many carbohydrates you eat helps keep your blood glucose within normal limits, which helps you manage your diabetes (diabetes mellitus). It is important to know how many carbohydrates you can safely have in each meal. This is different for every person. A diet and nutrition specialist (registered dietitian) can help you make a meal plan and calculate how many carbohydrates you should have at each meal and snack. Carbohydrates are found in the following foods:  Grains, such as breads and cereals.  Dried beans and soy products.  Starchy vegetables, such as potatoes, peas, and corn.  Fruit and fruit juices.  Milk and yogurt.  Sweets and snack foods, such as cake, cookies,  candy, chips, and soft drinks.  How do I count carbohydrates? There are two ways to count carbohydrates in food. You can use either of the methods or a combination of both. Reading "Nutrition Facts" on packaged food The "Nutrition Facts" list is included on the labels of almost all packaged foods and beverages in the [place] It includes:  The serving size.  Information about nutrients in each serving, including the grams (g) of carbohydrate per serving.  To use the "Nutrition Facts":  Decide how many servings you will have.  Multiply the number of servings by the number of carbohydrates per serving.  The resulting number is the total amount of carbohydrates that you will be having.  Learning standard serving sizes of other foods When you eat foods containing carbohydrates that are not packaged or do not include "Nutrition Facts" on the label, you need to measure the servings in order to count the amount of carbohydrates:  Measure the foods that you will eat with a food scale or measuring cup, if needed.  Decide how many standard-size servings you will eat.  Multiply the number of servings by 15. Most carbohydrate-rich foods have about 15 g of carbohydrates per serving. ? For example, if you eat 8 oz (170 g) of strawberries, you will have eaten 2 servings and 30 g of carbohydrates (2 servings x 15 g = 30 g).  For foods that have more than one food mixed, such as soups and  casseroles, you must count the carbohydrates in each food that is included.  The following list contains standard serving sizes of common carbohydrate-rich foods. Each of these servings has about 15 g of carbohydrates:   hamburger bun or  English muffin.   oz (15 mL) syrup.   oz (14 g) jelly.  1 slice of bread.  1 six-inch tortilla.  3 oz (85 g) cooked rice or pasta.  4 oz (113 g) cooked dried beans.  4 oz (113 g) starchy vegetable, such as peas, corn, or potatoes.  4 oz (113 g) hot cereal.  4 oz  (113 g) mashed potatoes or  of a large baked potato.  4 oz (113 g) canned or frozen fruit.  4 oz (120 mL) fruit juice.  4-6 crackers.  6 chicken nuggets.  6 oz (170 g) unsweetened dry cereal.  6 oz (170 g) plain fat-free yogurt or yogurt sweetened with artificial sweeteners.  8 oz (240 mL) milk.  8 oz (170 g) fresh fruit or one small piece of fruit.  24 oz (680 g) popped popcorn.  Example of carbohydrate counting Sample meal  3 oz (85 g) chicken breast.  6 oz (170 g) brown rice.  4 oz (113 g) corn.  8 oz (240 mL) milk.  8 oz (170 g) strawberries with sugar-free whipped topping. Carbohydrate calculation 1. Identify the foods that contain carbohydrates: ? Rice. ? Corn. ? Milk. ? Strawberries. 2. Calculate how many servings you have of each food: ? 2 servings rice. ? 1 serving corn. ? 1 serving milk. ? 1 serving strawberries. 3. Multiply each number of servings by 15 g: ? 2 servings rice x 15 g = 30 g. ? 1 serving corn x 15 g = 15 g. ? 1 serving milk x 15 g = 15 g. ? 1 serving strawberries x 15 g = 15 g. 4. Add together all of the amounts to find the total grams of carbohydrates eaten: ? 30 g + 15 g + 15 g + 15 g = 75 g of carbohydrates total. This information is not intended to replace advice given to you by your health care provider. Make sure you discuss any questions you have with your health care provider. Document Released: 11/02/2005 Document Revised: 05/22/2016 Document Reviewed: 04/15/2016 Elsevier Interactive Patient Education  [employer].   It is important to avoid accidents which may result in broken bones.  Here are a few ideas on how to make your home safer so you will be less likely to trip or fall.  1. Use nonskid mats or non slip strips in your shower or tub, on your bathroom floor and around sinks.  If you know that you have spilled water, wipe it up! 2. In the bathroom, it is important to have properly installed grab bars on the walls  or on the edge of the tub.  Towel racks are NOT strong enough for you to hold onto or to pull on for support. 3. Stairs and hallways should have enough light.  Add lamps or night lights if you need ore light. 4. It is good to have handrails on both sides of the stairs if possible.  Always fix broken handrails right away. 5. It is important to see the edges of steps.  Paint the edges of outdoor steps white so you can see them better.  Put colored tape on the edge of inside steps. 6. Throw-rugs are dangerous because they can slide.  Removing the rugs is  the best idea, but if they must stay, add adhesive carpet tape to prevent slipping. 7. Do not keep things on stairs or in the halls.  Remove small furniture that blocks the halls as it may cause you to trip.  Keep telephone and electrical cords out of the way where you walk. 8. Always were sturdy, rubber-soled shoes for good support.  Never wear just socks, especially on the stairs.  Socks may cause you to slip or fall.  Do not wear full-length housecoats as you can easily trip on the bottom.  9. Place the things you use the most on the shelves that are the easiest to reach.  If you use a stepstool, make sure it is in good condition.  If you feel unsteady, DO NOT climb, ask for help. 10. If a health professional advises you to use a cane or walker, do not be ashamed.  These items can keep you from falling and breaking your bones.

## 2017-07-02 NOTE — Progress Notes (Signed)
Subjective:  Patient ID: [name], male    DOB: 11-Jan-1935  Age: 82 y.o. MRN: [id number]  CC: No chief complaint on file.   HPI [name] presents for a well exam F/u post-ER visit on 8/17 for abd pain:  " Patient presented with acute onset abdominal pain last night. Associated symptoms include nausea. Patient with history of similar abdominal pain, once with partial obstruction, and once with enteritis. Physical exam reassuring, as patient does not appear distressed, and vital signs are stable. Minimal tenderness to palpation. Doubt intra-abdominal infection at this time. Will order basic abdominal labs, urine, and CT for further evaluation. Zofran, IV fluids, and morphine for symptom control. Case discussed with attending, Dr. Kathrynn Humble evaluated patient.  Labs reassuring, patient without a white count, and normal electrolytes. Discussed lactic acid level with attending, and will ensure no ischemic bowel or obstruction is causing the elevation. UA shows no sign of infection.  CT shows localized enteritis of the small bowel, likely inflammatory or infectious, doubt ischemic bowel. On reassessment, patient reports his pain is significantly improved, and he is no longer nauseous. Will try PO challenge, and if patient tolerates well, will discharge with prescription for Zofran, antibiotics, and pain medication. Patient to follow-up with primary care for further evaluation and possible GI referral. At this time, patient appears safe for discharge. Return precautions given. Patient states he understands and agrees to plan."  He was given abx. Sx's have resolved   Outpatient Medications Prior to Visit  Medication Sig Dispense Refill  . ACCU-CHEK FASTCLIX LANCETS MISC Use to help check blood sugars twice a day 306 each 1  . amLODipine (NORVASC) 5 MG tablet TAKE 1 TABLET (5 MG TOTAL) BY MOUTH DAILY. 30 tablet 11  . amoxicillin-clavulanate (AUGMENTIN) 875-125 MG tablet Take  1 tablet by mouth every 12 (twelve) hours. 14 tablet 0  . aspirin 81 MG tablet Take 81 mg by mouth daily.     . Blood Glucose Monitoring Suppl (ACCU-CHEK AVIVA PLUS) w/Device KIT Use as directed 1 kit 0  . cholecalciferol (VITAMIN D) 1000 UNITS tablet Take 1,000 Units by mouth 2 (two) times daily.     Marland Kitchen glucose blood (ACCU-CHEK AVIVA PLUS) test strip 1 each by Other route 2 (two) times daily. Use to check blood sugars twice a day 300 each 1  . HYDROcodone-acetaminophen (NORCO/VICODIN) 5-325 MG tablet Take 1 tablet by mouth every 6 (six) hours as needed for severe pain. 10 tablet 0  . ibuprofen (ADVIL,MOTRIN) 200 MG tablet Take 400 mg by mouth every 6 (six) hours as needed (pain).    . metFORMIN (GLUCOPHAGE) 500 MG tablet Take 1 tablet (500 mg total) by mouth 3 (three) times daily. 270 tablet 1  . metoprolol tartrate (LOPRESSOR) 25 MG tablet TAKE 1 TABLET BY MOUTH EVERY DAY 30 tablet 9  . ondansetron (ZOFRAN) 4 MG tablet Take 1 tablet (4 mg total) by mouth every 8 (eight) hours as needed for nausea or vomiting. 12 tablet 0  . repaglinide (PRANDIN) 1 MG tablet Take 1 tablet (1 mg total) by mouth 3 (three) times daily before meals. 90 tablet 11  . simvastatin (ZOCOR) 20 MG tablet TAKE 1/2 TABLET BY MOUTH EVERY DAY AT BEDTIME 45 tablet 2   No facility-administered medications prior to visit.     ROS Review of Systems  Constitutional: Negative for appetite change, fatigue and unexpected weight change.  HENT: Negative for congestion, nosebleeds, sneezing, sore throat and trouble swallowing.   Eyes:  Negative for itching and visual disturbance.  Respiratory: Negative for cough.   Cardiovascular: Negative for chest pain, palpitations and leg swelling.  Gastrointestinal: Positive for abdominal pain. Negative for abdominal distention, blood in stool, diarrhea and nausea.  Genitourinary: Negative for frequency and hematuria.  Musculoskeletal: Negative for back pain, gait problem, joint swelling and neck  pain.  Skin: Negative for rash.  Neurological: Negative for dizziness, tremors, speech difficulty and weakness.  Psychiatric/Behavioral: Negative for agitation, dysphoric mood, sleep disturbance and suicidal ideas. The patient is not nervous/anxious.     Objective:  BP 128/62 (BP Location: Left Arm, Patient Position: Sitting, Cuff Size: Normal)   Pulse 75   Temp 98.6 F (37 C) (Oral)   Ht _0  (1.727 m)   Wt 238 lb 4 oz (108.1 kg)   SpO2 95%   BMI 36.23 kg/m   BP Readings from Last 3 Encounters:  07/02/17 128/62  07/01/17 140/73  04/07/17 (!) 146/64    Wt Readings from Last 3 Encounters:  07/02/17 238 lb 4 oz (108.1 kg)  03/29/17 232 lb 0.6 oz (105.3 kg)  01/22/17 235 lb 6.4 oz (106.8 kg)    Physical Exam  Constitutional: He is oriented to person, place, and time. He appears well-developed. No distress.  NAD  HENT:  Mouth/Throat: Oropharynx is clear and moist.  Eyes: Pupils are equal, round, and reactive to light. Conjunctivae are normal.  Neck: Normal range of motion. No JVD present. No thyromegaly present.  Cardiovascular: Normal rate, regular rhythm, normal heart sounds and intact distal pulses.  Exam reveals no gallop and no friction rub.   No murmur heard. Pulmonary/Chest: Effort normal and breath sounds normal. No respiratory distress. He has no wheezes. He has no rales. He exhibits no tenderness.  Abdominal: Soft. Bowel sounds are normal. He exhibits no distension and no mass. There is no tenderness. There is no rebound and no guarding.  Musculoskeletal: Normal range of motion. He exhibits no edema or tenderness.  Lymphadenopathy:    He has no cervical adenopathy.  Neurological: He is alert and oriented to person, place, and time. He has normal reflexes. No cranial nerve deficit. He exhibits normal muscle tone. He displays a negative Romberg sign. Coordination and gait normal.  Skin: Skin is warm and dry. No rash noted.  Psychiatric: He has a normal mood and  affect. His behavior is normal. Judgment and thought content normal.    Lab Results  Component Value Date   WBC 9.5 07/01/2017   HGB 14.4 07/01/2017   HCT 41.6 07/01/2017   PLT 234 07/01/2017   GLUCOSE 169 (H) 07/01/2017   CHOL 116 01/22/2017   TRIG 158 (H) 01/22/2017   HDL 43 01/22/2017   LDLCALC 41 01/22/2017   ALT 24 07/01/2017   AST 25 07/01/2017   NA 142 07/01/2017   K 3.6 07/01/2017   CL 106 07/01/2017   CREATININE 0.88 07/01/2017   BUN 22 (H) 07/01/2017   CO2 24 07/01/2017   TSH 1.51 01/17/2016   PSA 1.55 12/26/2010   INR 2.3 03/03/2012   HGBA1C 8.0 (H) 03/29/2017    Ct Abdomen Pelvis W Contrast  Result Date: 07/01/2017 CLINICAL DATA:  Mid abdominal pain since yesterday.  Nausea. EXAM: CT ABDOMEN AND PELVIS WITH CONTRAST TECHNIQUE: Multidetector CT imaging of the abdomen and pelvis was performed using the standard protocol following bolus administration of intravenous contrast. CONTRAST:  172m ISOVUE-300 IOPAMIDOL (ISOVUE-300) INJECTION 61% COMPARISON:  CT scan 04/07/2017 FINDINGS: Lower chest: The lung bases  are clear of an acute process. Stable basilar scarring changes and stable 7 mm nodule at the left lung base. No change since 2016. The heart is normal in size and stable. Coronary artery calcifications are noted. The distal esophagus is grossly normal. Hepatobiliary: No focal hepatic lesions or intrahepatic biliary dilatation. The gallbladder is normal. No common bile duct dilatation. Pancreas: No mass, inflammation or ductal dilatation. Spleen: Normal size. No focal lesions. Small accessory spleen noted. Adrenals/Urinary Tract: The adrenal glands and kidneys are normal. No renal, ureteral or bladder calculi or mass. No collecting system abnormalities on the delayed images. Stomach/Bowel: The stomach, duodenum and proximal small bowel appear normal. The mid small bowel is fluid-filled but not significantly dilated. In the right lower quadrant there is an inflamed small  bowel loop with mucosal and serosal enhancement and submucosal edema and bowel wall thickening. This extends for several cm and transitions to normal distal small bowel. The terminal ileum appears normal. The colon is unremarkable. Scattered diverticulosis but no acute inflammatory changes, mass lesions or obstructive findings. The appendix is normal. Vascular/Lymphatic: Stable atherosclerotic calcifications involving the aorta and branch vessel ostia. Dense calcifications are noted at the origins of the celiac axis and SMA but the SMA and branch vessels are patent and I do not think this is ischemic change. Dense bilateral renal artery calcifications at their origins. The IMA is patent. No calcifications. No mesenteric or retroperitoneal mass or adenopathy. Reproductive: The prostate gland and seminal vesicles are unremarkable. Other: No pelvic mass or adenopathy. No free pelvic fluid collections. No inguinal mass or adenopathy. No abdominal wall hernia or subcutaneous lesions. Musculoskeletal: No significant bony findings. Advanced degenerative changes involving the lower lumbar spine. 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 IMPRESSION: 1. Focal/localized enteritis involving the mid distal small bowel in the right abdomen. There is mucosal and serosal enhancement, submucosal edema and bowel wall thickening. This is likely inflammatory or infectious process. Although there is fairly significant atherosclerotic calcifications of the origin of the SMA, the branch vessels are patent and the IMA is patent and I do not think this is ischemic. Patient had similar but not as pronounced findings on the prior CT scan from May 2018. 2. No other significant abdominal/pelvic findings. 3. Age related atherosclerotic calcifications involving the coronary arteries, abdominal aorta and branch vessels. Electronically Signed   By: Marijo Sanes M.D.   On: 07/01/2017 08:45    Assessment & Plan:   There are  no diagnoses linked to this encounter. I am having Mr. Schreckengost maintain his cholecalciferol, aspirin, amLODipine, metoprolol tartrate, repaglinide, ibuprofen, ACCU-CHEK FASTCLIX LANCETS, glucose blood, ACCU-CHEK AVIVA PLUS, metFORMIN, simvastatin, amoxicillin-clavulanate, HYDROcodone-acetaminophen, and ondansetron.  No orders of the defined types were placed in this encounter.    Follow-up: No Follow-up on file.  Walker Kehr, MD

## 2017-07-02 NOTE — Assessment & Plan Note (Signed)

## 2017-07-02 NOTE — Assessment & Plan Note (Signed)
labs

## 2017-07-02 NOTE — Assessment & Plan Note (Signed)
Labs q 3 mo 

## 2017-07-02 NOTE — Progress Notes (Signed)
Subjective:   [name] is a 82 y.o. male who presents for Medicare Annual/Subsequent preventive examination.  Review of Systems:  No ROS.  Medicare Wellness Visit. Additional risk factors are reflected in the social history.  Cardiac Risk Factors include: advanced age (>44mn, >>70women);diabetes mellitus;dyslipidemia;hypertension;male gender;sedentary lifestyle;obesity (BMI >30kg/m2) Sleep patterns: gets up 2-3 times nightly to void and sleeps 5-6 hours nightly. Patient reports insomnia issues, discussed recommended sleep tips and stress reduction tips, education was attached to patient's AVS.  Home Safety/Smoke Alarms: Feels safe in home. Smoke alarms in place.  Living environment; residence and Firearm Safety: no firearms, firearms stored safely. Seat Belt Safety/Bike Helmet: Wears seat belt.   Male:   CCS- N/A    PSA-  Lab Results  Component Value Date   PSA 1.55 12/26/2010   PSA 1.17 12/18/2008   PSA 1.17 11/05/2006       Objective:    Vitals: BP 128/62 (BP Location: Left Arm, Patient Position: Sitting, Cuff Size: Normal)   Pulse 75   Temp 98.6 F (37 C) (Oral)   Ht '5\' 8"'  (16.606m)   Wt 238 lb 4 oz (108.1 kg)   SpO2 95%   BMI 36.23 kg/m   Body mass index is 36.23 kg/m.  Tobacco History  Smoking Status  . Former Smoker  . Quit date: 01/02/1981  Smokeless Tobacco  . Never Used     Counseling given: Not Answered   Past Medical History:  Diagnosis Date  . Arthritis   . Atrial fibrillation ([facility]    post op; amiodarone and coumadin continued for 3 mos post op  . CARCINOMA, SKIN, SQUAMOUS CELL 01/07/2010  . Cataract    beginning of cataracts  . COLONIC POLYPS, HX OF 04/26/2007  . Coronary artery disease    s/p CABG 3/12: L-LAD, S-CFX (Dr. ORoxy Manns; EF 45% at cath prior to CABG  . DIABETES MELLITUS, TYPE II 04/26/2007  . Hyperlipidemia   . HYPERTENSION 04/26/2007  . NEPHROLITHIASIS, HX OF 04/26/2007  . SBO (small bowel obstruction) ([facility] 10/2015   Partial   . Shortness of breath dyspnea    with exertion  . Unspecified hearing loss 12/18/2008   Past Surgical History:  Procedure Laterality Date  . BACK SURGERY     disectomy  . COLONOSCOPY    . CORONARY ARTERY BYPASS GRAFT  01/12/2011  . LUMBAR LAMINECTOMY/DECOMPRESSION MICRODISCECTOMY N/A 05/08/2015   Procedure: LUMBAR LAMINECTOMY/DECOMPRESSION MICRODISCECTOMY 1 LEVEL Lumbar four five;  Surgeon: DMelina Schools MD;  [place] [facility]  Service: Orthopedics;  Laterality: N/A;  . TOTAL HIP ARTHROPLASTY Left    left on 04/15/07  . TYMPANIC MEMBRANE REPAIR  1981   Family History  Problem Relation Age of Onset  . Heart failure Mother   . Heart disease Father   . Cancer Sister   . Cancer Brother   . Heart disease Brother   . Breast cancer Sister   . Colon cancer Neg Hx    History  Sexual Activity  . Sexual activity: Not on file    Outpatient Encounter Prescriptions as of 07/02/2017  Medication Sig  . ACCU-CHEK FASTCLIX LANCETS MISC Use to help check blood sugars twice a day  . amLODipine (NORVASC) 5 MG tablet TAKE 1 TABLET (5 MG TOTAL) BY MOUTH DAILY.  .Marland Kitchenamoxicillin-clavulanate (AUGMENTIN) 875-125 MG tablet Take 1 tablet by mouth every 12 (twelve) hours.  .Marland Kitchenaspirin 81 MG tablet Take 81 mg by mouth daily.   . Blood Glucose Monitoring Suppl (ACCU-CHEK  AVIVA PLUS) w/Device KIT Use as directed  . cholecalciferol (VITAMIN D) 1000 UNITS tablet Take 1,000 Units by mouth 2 (two) times daily.   Marland Kitchen glucose blood (ACCU-CHEK AVIVA PLUS) test strip 1 each by Other route 2 (two) times daily. Use to check blood sugars twice a day  . HYDROcodone-acetaminophen (NORCO/VICODIN) 5-325 MG tablet Take 1 tablet by mouth every 6 (six) hours as needed for severe pain.  Marland Kitchen ibuprofen (ADVIL,MOTRIN) 200 MG tablet Take 400 mg by mouth every 6 (six) hours as needed (pain).  . metFORMIN (GLUCOPHAGE) 500 MG tablet Take 1 tablet (500 mg total) by mouth 3 (three) times daily.  . metoprolol tartrate (LOPRESSOR) 25 MG  tablet TAKE 1 TABLET BY MOUTH EVERY DAY  . ondansetron (ZOFRAN) 4 MG tablet Take 1 tablet (4 mg total) by mouth every 8 (eight) hours as needed for nausea or vomiting.  . repaglinide (PRANDIN) 1 MG tablet Take 1 tablet (1 mg total) by mouth 3 (three) times daily before meals.  . simvastatin (ZOCOR) 20 MG tablet TAKE 1/2 TABLET BY MOUTH EVERY DAY AT BEDTIME  . Zoster Vac Recomb Adjuvanted [facility]) injection Inject 0.5 mLs into the muscle once. Repeat in 6 months  . [DISCONTINUED] Zoster Vac Recomb Adjuvanted [facility]) injection Inject 0.5 mLs into the muscle once. Repeat in 6 months   No facility-administered encounter medications on file as of 07/02/2017.     Activities of Daily Living In your present state of health, do you have any difficulty performing the following activities: 07/02/2017  Hearing? N  Vision? N  Difficulty concentrating or making decisions? N  Walking or climbing stairs? N  Dressing or bathing? N  Doing errands, shopping? N  Preparing Food and eating ? N  Using the Toilet? N  In the past six months, have you accidently leaked urine? N  Do you have problems with loss of bowel control? N  Managing your Medications? N  Managing your Finances? N  Housekeeping or managing your Housekeeping? N  Some recent data might be hidden    Patient Care Team: [name], Evie Lacks, MD as PCP - General (Internal Medicine)   Assessment:    Physical assessment deferred to PCP.  Exercise Activities and Dietary recommendations Current Exercise Habits: The patient does not participate in regular exercise at present (states he does yard work), Exercise limited by: orthopedic condition(s)  Diet (meal preparation, eat out, water intake, caffeinated beverages, dairy products, fruits and vegetables): in general, an "unhealthy" diet  Reviewed heart healthy and diabetic diet, encouraged patient to increase daily water intake. Diet education was attached to patient's AVS.    Goals     . Increase the amount that I walk          I will get up go to the [place] and walk once a week      Fall Risk Fall Risk  07/02/2017 05/26/2016 05/21/2015 01/16/2015 01/03/2013  Falls in the past year? No No No No No  Risk for fall due to : Impaired mobility - - - -   Depression Screen PHQ 2/9 Scores 07/02/2017 05/26/2016 05/21/2015 01/16/2015  PHQ - 2 Score 2 0 0 0  PHQ- 9 Score 5 - - -    Cognitive Function MMSE - Mini Mental State Exam 07/02/2017  Orientation to time 5  Orientation to Place 5  Registration 3  Attention/ Calculation 4  Recall 2  Language- name 2 objects 2  Language- repeat 1  Language- follow 3 step command 3  Language- read & follow direction 1  Write a sentence 1  Copy design 1  Total score 28        Immunization History  Administered Date(s) Administered  . Influenza Nasal 08/28/2016  . Influenza Whole 09/14/2007, 09/01/2010  . Influenza,inj,Quad PF,36+ Mos 08/20/2015  . Influenza-Unspecified 08/16/2014  . Pneumococcal Conjugate-13 08/16/2014  . Pneumococcal Polysaccharide-23 10/16/2002, 02/18/2016  . Td 11/16/1993, 12/18/2008  . Zoster 07/24/2011   Screening Tests Health Maintenance  Topic Date Due  . URINE MICROALBUMIN  01/25/1945  . FOOT EXAM  11/16/2012  . INFLUENZA VACCINE  06/16/2017  . OPHTHALMOLOGY EXAM  03/25/2018  . TETANUS/TDAP  12/18/2018  . PNA vac Low Risk Adult  Completed      Plan:    Continue doing brain stimulating activities (puzzles, reading, adult coloring books, staying active) to keep memory sharp.   Start to eat heart healthy diet (full of fruits, vegetables, whole grains, lean protein, water--limit salt, fat, and sugar intake) and increase physical activity as tolerated.   I have personally reviewed and noted the following in the patient's chart:   . Medical and social history . Use of alcohol, tobacco or illicit drugs  . Current medications and supplements . Functional ability and status . Nutritional  status . Physical activity . Advanced directives . List of other physicians . Vitals . Screenings to include cognitive, depression, and falls . Referrals and appointments  In addition, I have reviewed and discussed with patient certain preventive protocols, quality metrics, and best practice recommendations. A written personalized care plan for preventive services as well as general preventive health recommendations were provided to patient.     Michiel Cowboy, RN  07/02/2017

## 2017-07-02 NOTE — Assessment & Plan Note (Signed)
CT abd: Focal/localized enteritis involving the mid distal small bowel in the right abdomen. There is mucosal and serosal enhancement, submucosal edema and bowel wall thickening. This is likely inflammatory or infectious process. Although there is fairly significant atherosclerotic calcifications of the origin of the SMA, the branch vessels are patent and the IMA is patent and I do not think this is ischemic. Patient had similar but not as pronounced findings on the prior CT scan from May 2018.  Augmentin po

## 2017-07-05 ENCOUNTER — Telehealth: Payer: Self-pay

## 2017-07-05 NOTE — Telephone Encounter (Signed)
-----   Message from Cassandria Anger, MD sent at 07/03/2017 11:09 PM EDT ----- Inez Catalina, Please inform the patient that all labs are stable Thanks, AP

## 2017-07-05 NOTE — Telephone Encounter (Signed)
Pt has been informed and expressed understanding.  

## 2017-09-05 ENCOUNTER — Other Ambulatory Visit: Payer: Self-pay | Admitting: Internal Medicine

## 2017-10-05 ENCOUNTER — Ambulatory Visit (INDEPENDENT_AMBULATORY_CARE_PROVIDER_SITE_OTHER): Payer: Medicare Other | Admitting: Internal Medicine

## 2017-10-05 ENCOUNTER — Encounter: Payer: Self-pay | Admitting: Internal Medicine

## 2017-10-05 ENCOUNTER — Other Ambulatory Visit (INDEPENDENT_AMBULATORY_CARE_PROVIDER_SITE_OTHER): Payer: Medicare Other

## 2017-10-05 DIAGNOSIS — I10 Essential (primary) hypertension: Secondary | ICD-10-CM

## 2017-10-05 DIAGNOSIS — E1159 Type 2 diabetes mellitus with other circulatory complications: Secondary | ICD-10-CM | POA: Diagnosis not present

## 2017-10-05 DIAGNOSIS — E785 Hyperlipidemia, unspecified: Secondary | ICD-10-CM

## 2017-10-05 LAB — HEMOGLOBIN A1C: Hgb A1c MFr Bld: 6.5 % (ref 4.6–6.5)

## 2017-10-05 LAB — BASIC METABOLIC PANEL
BUN: 18 mg/dL (ref 6–23)
CHLORIDE: 104 meq/L (ref 96–112)
CO2: 29 meq/L (ref 19–32)
CREATININE: 0.98 mg/dL (ref 0.40–1.50)
Calcium: 9.4 mg/dL (ref 8.4–10.5)
GFR: 78.08 mL/min (ref >60.00)
Glucose, Bld: 149 mg/dL — ABNORMAL HIGH (ref 70–99)
Potassium: 4.4 mEq/L (ref 3.5–5.1)
Sodium: 140 mEq/L (ref 135–145)

## 2017-10-05 NOTE — Assessment & Plan Note (Signed)
Prandin, Metformin

## 2017-10-05 NOTE — Progress Notes (Signed)
Subjective:  Patient ID: [name], male    DOB: [birth year]  Age: 82 y.o. MRN: [id number]  CC: No chief complaint on file.   HPI [name] presents for DM, CAD, HTN f/u  Outpatient Medications Prior to Visit  Medication Sig Dispense Refill  . ACCU-CHEK FASTCLIX LANCETS MISC Use to help check blood sugars twice a day 306 each 1  . amLODipine (NORVASC) 5 MG tablet TAKE 1 TABLET (5 MG TOTAL) BY MOUTH DAILY. 30 tablet 11  . aspirin 81 MG tablet Take 81 mg by mouth daily.     . Blood Glucose Monitoring Suppl (ACCU-CHEK AVIVA PLUS) w/Device KIT Use as directed 1 kit 0  . cholecalciferol (VITAMIN D) 1000 UNITS tablet Take 1,000 Units by mouth 2 (two) times daily.     Marland Kitchen glucose blood (ACCU-CHEK AVIVA PLUS) test strip 1 each by Other route 2 (two) times daily. Use to check blood sugars twice a day 300 each 1  . HYDROcodone-acetaminophen (NORCO/VICODIN) 5-325 MG tablet Take 1 tablet by mouth every 6 (six) hours as needed for severe pain. 10 tablet 0  . ibuprofen (ADVIL,MOTRIN) 200 MG tablet Take 400 mg by mouth every 6 (six) hours as needed (pain).    . metFORMIN (GLUCOPHAGE) 500 MG tablet TAKE 1 TABLET BY MOUTH 3 TIMES A DAY 90 tablet 11  . metoprolol tartrate (LOPRESSOR) 25 MG tablet TAKE 1 TABLET BY MOUTH EVERY DAY 30 tablet 9  . ondansetron (ZOFRAN) 4 MG tablet Take 1 tablet (4 mg total) by mouth every 8 (eight) hours as needed for nausea or vomiting. 12 tablet 0  . repaglinide (PRANDIN) 1 MG tablet Take 1 tablet (1 mg total) by mouth 3 (three) times daily before meals. 90 tablet 11  . simvastatin (ZOCOR) 20 MG tablet TAKE 1/2 TABLET BY MOUTH EVERY DAY AT BEDTIME 45 tablet 2  . amoxicillin-clavulanate (AUGMENTIN) 875-125 MG tablet Take 1 tablet by mouth every 12 (twelve) hours. 14 tablet 0  . metFORMIN (GLUCOPHAGE) 500 MG tablet Take 1 tablet (500 mg total) by mouth 3 (three) times daily. 270 tablet 1   No facility-administered medications prior to visit.     ROS Review  of Systems  Constitutional: Negative for appetite change, fatigue and unexpected weight change.  HENT: Negative for congestion, nosebleeds, sneezing, sore throat and trouble swallowing.   Eyes: Negative for itching and visual disturbance.  Respiratory: Negative for cough.   Cardiovascular: Negative for chest pain, palpitations and leg swelling.  Gastrointestinal: Negative for abdominal distention, blood in stool, diarrhea and nausea.  Genitourinary: Negative for frequency and hematuria.  Musculoskeletal: Positive for arthralgias. Negative for back pain, gait problem, joint swelling and neck pain.  Skin: Negative for rash.  Neurological: Negative for dizziness, tremors, speech difficulty and weakness.  Psychiatric/Behavioral: Negative for agitation, dysphoric mood and sleep disturbance. The patient is not nervous/anxious.     Objective:  BP 130/84 (BP Location: Left Arm, Patient Position: Sitting, Cuff Size: Large)   Pulse 75   Temp 98.8 F (37.1 C) (Oral)   Ht _0  (1.727 m)   Wt 237 lb (107.5 kg)   SpO2 97%   BMI 36.04 kg/m   BP Readings from Last 3 Encounters:  10/05/17 130/84  07/02/17 128/62  07/01/17 140/73    Wt Readings from Last 3 Encounters:  10/05/17 237 lb (107.5 kg)  07/02/17 238 lb 4 oz (108.1 kg)  03/29/17 232 lb 0.6 oz (105.3 kg)    Physical Exam  Constitutional:  He is oriented to person, place, and time. He appears well-developed. No distress.  NAD  HENT:  Mouth/Throat: Oropharynx is clear and moist.  Eyes: Conjunctivae are normal. Pupils are equal, round, and reactive to light.  Neck: Normal range of motion. No JVD present. No thyromegaly present.  Cardiovascular: Normal rate, regular rhythm, normal heart sounds and intact distal pulses. Exam reveals no gallop and no friction rub.  No murmur heard. Pulmonary/Chest: Effort normal and breath sounds normal. No respiratory distress. He has no wheezes. He has no rales. He exhibits no tenderness.  Abdominal:  Soft. Bowel sounds are normal. He exhibits no distension and no mass. There is no tenderness. There is no rebound and no guarding.  Musculoskeletal: Normal range of motion. He exhibits no edema or tenderness.  Lymphadenopathy:    He has no cervical adenopathy.  Neurological: He is alert and oriented to person, place, and time. He has normal reflexes. No cranial nerve deficit. He exhibits normal muscle tone. He displays a negative Romberg sign. Coordination and gait normal.  Skin: Skin is warm and dry. No rash noted.  Psychiatric: He has a normal mood and affect. His behavior is normal. Judgment and thought content normal.   Obese  Lab Results  Component Value Date   WBC 9.5 07/01/2017   HGB 14.4 07/01/2017   HCT 41.6 07/01/2017   PLT 234 07/01/2017   GLUCOSE 149 (H) 10/05/2017   CHOL 116 01/22/2017   TRIG 158 (H) 01/22/2017   HDL 43 01/22/2017   LDLCALC 41 01/22/2017   ALT 24 07/01/2017   AST 25 07/01/2017   NA 140 10/05/2017   K 4.4 10/05/2017   CL 104 10/05/2017   CREATININE 0.98 10/05/2017   BUN 18 10/05/2017   CO2 29 10/05/2017   TSH 2.05 07/02/2017   PSA 1.18 07/02/2017   INR 2.3 03/03/2012   HGBA1C 6.5 10/05/2017    Ct Abdomen Pelvis W Contrast  Result Date: 07/01/2017 CLINICAL DATA:  Mid abdominal pain since yesterday.  Nausea. EXAM: CT ABDOMEN AND PELVIS WITH CONTRAST TECHNIQUE: Multidetector CT imaging of the abdomen and pelvis was performed using the standard protocol following bolus administration of intravenous contrast. CONTRAST:  163m ISOVUE-300 IOPAMIDOL (ISOVUE-300) INJECTION 61% COMPARISON:  CT scan 04/07/2017 FINDINGS: Lower chest: The lung bases are clear of an acute process. Stable basilar scarring changes and stable 7 mm nodule at the left lung base. No change since 2016. The heart is normal in size and stable. Coronary artery calcifications are noted. The distal esophagus is grossly normal. Hepatobiliary: No focal hepatic lesions or intrahepatic biliary  dilatation. The gallbladder is normal. No common bile duct dilatation. Pancreas: No mass, inflammation or ductal dilatation. Spleen: Normal size. No focal lesions. Small accessory spleen noted. Adrenals/Urinary Tract: The adrenal glands and kidneys are normal. No renal, ureteral or bladder calculi or mass. No collecting system abnormalities on the delayed images. Stomach/Bowel: The stomach, duodenum and proximal small bowel appear normal. The mid small bowel is fluid-filled but not significantly dilated. In the right lower quadrant there is an inflamed small bowel loop with mucosal and serosal enhancement and submucosal edema and bowel wall thickening. This extends for several cm and transitions to normal distal small bowel. The terminal ileum appears normal. The colon is unremarkable. Scattered diverticulosis but no acute inflammatory changes, mass lesions or obstructive findings. The appendix is normal. Vascular/Lymphatic: Stable atherosclerotic calcifications involving the aorta and branch vessel ostia. Dense calcifications are noted at the origins of the celiac axis  and SMA but the SMA and branch vessels are patent and I do not think this is ischemic change. Dense bilateral renal artery calcifications at their origins. The IMA is patent. No calcifications. No mesenteric or retroperitoneal mass or adenopathy. Reproductive: The prostate gland and seminal vesicles are unremarkable. Other: No pelvic mass or adenopathy. No free pelvic fluid collections. No inguinal mass or adenopathy. No abdominal wall hernia or subcutaneous lesions. Musculoskeletal: No significant bony findings. Advanced degenerative changes involving the lower lumbar spine. 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 IMPRESSION: 1. Focal/localized enteritis involving the mid distal small bowel in the right abdomen. There is mucosal and serosal enhancement, submucosal edema and bowel wall thickening. This is likely inflammatory or  infectious process. Although there is fairly significant atherosclerotic calcifications of the origin of the SMA, the branch vessels are patent and the IMA is patent and I do not think this is ischemic. Patient had similar but not as pronounced findings on the prior CT scan from May 2018. 2. No other significant abdominal/pelvic findings. 3. Age related atherosclerotic calcifications involving the coronary arteries, abdominal aorta and branch vessels. Electronically Signed   By: Marijo Sanes M.D.   On: 07/01/2017 08:45    Assessment & Plan:   There are no diagnoses linked to this encounter. I have discontinued Nora E. Red's amoxicillin-clavulanate. I am also having him maintain his cholecalciferol, aspirin, amLODipine, metoprolol tartrate, repaglinide, ibuprofen, ACCU-CHEK FASTCLIX LANCETS, glucose blood, ACCU-CHEK AVIVA PLUS, simvastatin, HYDROcodone-acetaminophen, ondansetron, and metFORMIN.  No orders of the defined types were placed in this encounter.    Follow-up: No Follow-up on file.  Walker Kehr, MD

## 2017-10-05 NOTE — Assessment & Plan Note (Signed)
On Metoprolol  and Norvasc 

## 2017-10-05 NOTE — Assessment & Plan Note (Signed)
Simvastatin 

## 2017-10-06 DIAGNOSIS — H40021 Open angle with borderline findings, high risk, right eye: Secondary | ICD-10-CM | POA: Diagnosis not present

## 2017-10-06 DIAGNOSIS — H40052 Ocular hypertension, left eye: Secondary | ICD-10-CM | POA: Diagnosis not present

## 2017-10-19 ENCOUNTER — Other Ambulatory Visit: Payer: Self-pay | Admitting: Internal Medicine

## 2017-10-28 ENCOUNTER — Other Ambulatory Visit: Payer: Self-pay | Admitting: Internal Medicine

## 2018-01-05 ENCOUNTER — Ambulatory Visit (INDEPENDENT_AMBULATORY_CARE_PROVIDER_SITE_OTHER): Payer: Medicare Other | Admitting: Internal Medicine

## 2018-01-05 ENCOUNTER — Encounter: Payer: Self-pay | Admitting: Internal Medicine

## 2018-01-05 ENCOUNTER — Other Ambulatory Visit (INDEPENDENT_AMBULATORY_CARE_PROVIDER_SITE_OTHER): Payer: Medicare Other

## 2018-01-05 VITALS — BP 128/72 | HR 68 | Temp 98.0°F | Ht 68.0 in | Wt 240.0 lb

## 2018-01-05 DIAGNOSIS — G8929 Other chronic pain: Secondary | ICD-10-CM

## 2018-01-05 DIAGNOSIS — I2510 Atherosclerotic heart disease of native coronary artery without angina pectoris: Secondary | ICD-10-CM

## 2018-01-05 DIAGNOSIS — I10 Essential (primary) hypertension: Secondary | ICD-10-CM

## 2018-01-05 DIAGNOSIS — M545 Low back pain: Secondary | ICD-10-CM

## 2018-01-05 DIAGNOSIS — E1159 Type 2 diabetes mellitus with other circulatory complications: Secondary | ICD-10-CM

## 2018-01-05 LAB — BASIC METABOLIC PANEL
BUN: 20 mg/dL (ref 6–23)
CHLORIDE: 103 meq/L (ref 96–112)
CO2: 26 meq/L (ref 19–32)
Calcium: 9.5 mg/dL (ref 8.4–10.5)
Creatinine, Ser: 1.03 mg/dL (ref 0.40–1.50)
GFR: 73.68 mL/min (ref >60.00)
Glucose, Bld: 147 mg/dL — ABNORMAL HIGH (ref 70–99)
POTASSIUM: 4 meq/L (ref 3.5–5.1)
SODIUM: 139 meq/L (ref 135–145)

## 2018-01-05 LAB — HEMOGLOBIN A1C: HEMOGLOBIN A1C: 4.7 % (ref 4.6–6.5)

## 2018-01-05 MED ORDER — METFORMIN HCL 500 MG PO TABS: 500.0000 mg | ORAL_TABLET | Freq: Two times a day (BID) | ORAL | 2 refills | Status: DC

## 2018-01-05 NOTE — Assessment & Plan Note (Signed)
Metoprolol  and Norvasc

## 2018-01-05 NOTE — Assessment & Plan Note (Signed)
Labs ok.

## 2018-01-05 NOTE — Assessment & Plan Note (Signed)
Ibuprofen prn 

## 2018-01-05 NOTE — Assessment & Plan Note (Signed)
ASA F/u w/Cardiology

## 2018-01-05 NOTE — Progress Notes (Signed)
Subjective:  Patient ID: [name], male    DOB: Dec 26, 1934  Age: 83 y.o. MRN: [id number]  CC: No chief complaint on file.   HPI AMARION PORTELL presents for HTN, DM, LBP, dyslipidemia f/u  Outpatient Medications Prior to Visit  Medication Sig Dispense Refill  . ACCU-CHEK FASTCLIX LANCETS MISC Use to help check blood sugars twice a day 306 each 1  . amLODipine (NORVASC) 5 MG tablet TAKE 1 TABLET (5 MG TOTAL) BY MOUTH DAILY. 30 tablet 11  . aspirin 81 MG tablet Take 81 mg by mouth daily.     . Blood Glucose Monitoring Suppl (ACCU-CHEK AVIVA PLUS) w/Device KIT Use as directed 1 kit 0  . cholecalciferol (VITAMIN D) 1000 UNITS tablet Take 1,000 Units by mouth 2 (two) times daily.     Marland Kitchen glucose blood (ACCU-CHEK AVIVA PLUS) test strip 1 each by Other route 2 (two) times daily. Use to check blood sugars twice a day 300 each 1  . HYDROcodone-acetaminophen (NORCO/VICODIN) 5-325 MG tablet Take 1 tablet by mouth every 6 (six) hours as needed for severe pain. 10 tablet 0  . ibuprofen (ADVIL,MOTRIN) 200 MG tablet Take 400 mg by mouth every 6 (six) hours as needed (pain).    . metFORMIN (GLUCOPHAGE) 500 MG tablet TAKE 1 TABLET BY MOUTH 3  TIMES DAILY 270 tablet 2  . metoprolol tartrate (LOPRESSOR) 25 MG tablet Take 1 tablet (25 mg total) by mouth daily. Please make yearly appt with Dr. Harrington Challenger for March. 1st attempt 30 tablet 2  . ondansetron (ZOFRAN) 4 MG tablet Take 1 tablet (4 mg total) by mouth every 8 (eight) hours as needed for nausea or vomiting. 12 tablet 0  . repaglinide (PRANDIN) 1 MG tablet Take 1 tablet (1 mg total) by mouth 3 (three) times daily before meals. 90 tablet 11  . simvastatin (ZOCOR) 20 MG tablet TAKE 1/2 TABLET BY MOUTH EVERY DAY AT BEDTIME 45 tablet 2  . metFORMIN (GLUCOPHAGE) 500 MG tablet TAKE 1 TABLET BY MOUTH 3 TIMES A DAY 90 tablet 11   No facility-administered medications prior to visit.     ROS Review of Systems  Constitutional: Positive for unexpected  weight change. Negative for appetite change and fatigue.  HENT: Negative for congestion, nosebleeds, sneezing, sore throat and trouble swallowing.   Eyes: Negative for itching and visual disturbance.  Respiratory: Negative for cough.   Cardiovascular: Negative for chest pain, palpitations and leg swelling.  Gastrointestinal: Negative for abdominal distention, blood in stool, diarrhea and nausea.  Genitourinary: Negative for frequency and hematuria.  Musculoskeletal: Positive for arthralgias and back pain. Negative for gait problem, joint swelling and neck pain.  Skin: Negative for rash.  Neurological: Negative for dizziness, tremors, speech difficulty and weakness.  Psychiatric/Behavioral: Negative for agitation, dysphoric mood and sleep disturbance. The patient is not nervous/anxious.     Objective:  BP 128/72 (BP Location: Left Arm, Patient Position: Sitting, Cuff Size: Large)   Pulse 68   Temp 98 F (36.7 C) (Oral)   Ht 5' 8" (1.727 m)   Wt 240 lb (108.9 kg)   SpO2 98%   BMI 36.49 kg/m   BP Readings from Last 3 Encounters:  01/05/18 128/72  10/05/17 130/84  07/02/17 128/62    Wt Readings from Last 3 Encounters:  01/05/18 240 lb (108.9 kg)  10/05/17 237 lb (107.5 kg)  07/02/17 238 lb 4 oz (108.1 kg)    Physical Exam  Constitutional: He is oriented to person, place, and  time. He appears well-developed. No distress.  NAD  HENT:  Mouth/Throat: Oropharynx is clear and moist.  Eyes: Conjunctivae are normal. Pupils are equal, round, and reactive to light.  Neck: Normal range of motion. No JVD present. No thyromegaly present.  Cardiovascular: Normal rate, regular rhythm, normal heart sounds and intact distal pulses. Exam reveals no gallop and no friction rub.  No murmur heard. Pulmonary/Chest: Effort normal and breath sounds normal. No respiratory distress. He has no wheezes. He has no rales. He exhibits no tenderness.  Abdominal: Soft. Bowel sounds are normal. He exhibits no  distension and no mass. There is no tenderness. There is no rebound and no guarding.  Musculoskeletal: Normal range of motion. He exhibits tenderness. He exhibits no edema.  Lymphadenopathy:    He has no cervical adenopathy.  Neurological: He is alert and oriented to person, place, and time. He has normal reflexes. No cranial nerve deficit. He exhibits normal muscle tone. He displays a negative Romberg sign. Coordination abnormal. Gait normal.  Skin: Skin is warm and dry. No rash noted.  Psychiatric: He has a normal mood and affect. His behavior is normal. Judgment and thought content normal.  obese LS tender   Lab Results  Component Value Date   WBC 9.5 07/01/2017   HGB 14.4 07/01/2017   HCT 41.6 07/01/2017   PLT 234 07/01/2017   GLUCOSE 147 (H) 01/05/2018   CHOL 116 01/22/2017   TRIG 158 (H) 01/22/2017   HDL 43 01/22/2017   LDLCALC 41 01/22/2017   ALT 24 07/01/2017   AST 25 07/01/2017   NA 139 01/05/2018   K 4.0 01/05/2018   CL 103 01/05/2018   CREATININE 1.03 01/05/2018   BUN 20 01/05/2018   CO2 26 01/05/2018   TSH 2.05 07/02/2017   PSA 1.18 07/02/2017   INR 2.3 03/03/2012   HGBA1C 4.7 01/05/2018    Ct Abdomen Pelvis W Contrast  Result Date: 07/01/2017 CLINICAL DATA:  Mid abdominal pain since yesterday.  Nausea. EXAM: CT ABDOMEN AND PELVIS WITH CONTRAST TECHNIQUE: Multidetector CT imaging of the abdomen and pelvis was performed using the standard protocol following bolus administration of intravenous contrast. CONTRAST:  173m ISOVUE-300 IOPAMIDOL (ISOVUE-300) INJECTION 61% COMPARISON:  CT scan 04/07/2017 FINDINGS: Lower chest: The lung bases are clear of an acute process. Stable basilar scarring changes and stable 7 mm nodule at the left lung base. No change since 2016. The heart is normal in size and stable. Coronary artery calcifications are noted. The distal esophagus is grossly normal. Hepatobiliary: No focal hepatic lesions or intrahepatic biliary dilatation. The  gallbladder is normal. No common bile duct dilatation. Pancreas: No mass, inflammation or ductal dilatation. Spleen: Normal size. No focal lesions. Small accessory spleen noted. Adrenals/Urinary Tract: The adrenal glands and kidneys are normal. No renal, ureteral or bladder calculi or mass. No collecting system abnormalities on the delayed images. Stomach/Bowel: The stomach, duodenum and proximal small bowel appear normal. The mid small bowel is fluid-filled but not significantly dilated. In the right lower quadrant there is an inflamed small bowel loop with mucosal and serosal enhancement and submucosal edema and bowel wall thickening. This extends for several cm and transitions to normal distal small bowel. The terminal ileum appears normal. The colon is unremarkable. Scattered diverticulosis but no acute inflammatory changes, mass lesions or obstructive findings. The appendix is normal. Vascular/Lymphatic: Stable atherosclerotic calcifications involving the aorta and branch vessel ostia. Dense calcifications are noted at the origins of the celiac axis and SMA but the  SMA and branch vessels are patent and I do not think this is ischemic change. Dense bilateral renal artery calcifications at their origins. The IMA is patent. No calcifications. No mesenteric or retroperitoneal mass or adenopathy. Reproductive: The prostate gland and seminal vesicles are unremarkable. Other: No pelvic mass or adenopathy. No free pelvic fluid collections. No inguinal mass or adenopathy. No abdominal wall hernia or subcutaneous lesions. Musculoskeletal: No significant bony findings. Advanced degenerative changes involving the lower lumbar spine. 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 IMPRESSION: 1. Focal/localized enteritis involving the mid distal small bowel in the right abdomen. There is mucosal and serosal enhancement, submucosal edema and bowel wall thickening. This is likely inflammatory or infectious  process. Although there is fairly significant atherosclerotic calcifications of the origin of the SMA, the branch vessels are patent and the IMA is patent and I do not think this is ischemic. Patient had similar but not as pronounced findings on the prior CT scan from May 2018. 2. No other significant abdominal/pelvic findings. 3. Age related atherosclerotic calcifications involving the coronary arteries, abdominal aorta and branch vessels. Electronically Signed   By: Marijo Sanes M.D.   On: 07/01/2017 08:45    Assessment & Plan:   There are no diagnoses linked to this encounter. I am having Jerre Simon maintain his cholecalciferol, aspirin, amLODipine, repaglinide, ibuprofen, ACCU-CHEK FASTCLIX LANCETS, glucose blood, ACCU-CHEK AVIVA PLUS, simvastatin, HYDROcodone-acetaminophen, ondansetron, metFORMIN, and metoprolol tartrate.  No orders of the defined types were placed in this encounter.    Follow-up: No Follow-up on file.  Walker Kehr, MD

## 2018-01-13 DIAGNOSIS — H401121 Primary open-angle glaucoma, left eye, mild stage: Secondary | ICD-10-CM | POA: Diagnosis not present

## 2018-01-13 DIAGNOSIS — H40021 Open angle with borderline findings, high risk, right eye: Secondary | ICD-10-CM | POA: Diagnosis not present

## 2018-01-13 DIAGNOSIS — H35033 Hypertensive retinopathy, bilateral: Secondary | ICD-10-CM | POA: Diagnosis not present

## 2018-01-13 DIAGNOSIS — H353132 Nonexudative age-related macular degeneration, bilateral, intermediate dry stage: Secondary | ICD-10-CM | POA: Diagnosis not present

## 2018-01-13 LAB — HM DIABETES EYE EXAM

## 2018-01-21 ENCOUNTER — Encounter (HOSPITAL_COMMUNITY): Payer: Self-pay

## 2018-01-21 ENCOUNTER — Other Ambulatory Visit: Payer: Self-pay

## 2018-01-21 ENCOUNTER — Emergency Department (HOSPITAL_COMMUNITY): Payer: Medicare Other

## 2018-01-21 ENCOUNTER — Emergency Department (HOSPITAL_COMMUNITY)
Admission: EM | Admit: 2018-01-21 | Discharge: 2018-01-22 | Disposition: A | Discharge status: Home / Self Care | Payer: Medicare Other | Attending: Emergency Medicine | Admitting: Emergency Medicine

## 2018-01-21 DIAGNOSIS — Z79899 Other long term (current) drug therapy: Secondary | ICD-10-CM | POA: Insufficient documentation

## 2018-01-21 DIAGNOSIS — I4891 Unspecified atrial fibrillation: Secondary | ICD-10-CM | POA: Diagnosis not present

## 2018-01-21 DIAGNOSIS — Z87891 Personal history of nicotine dependence: Secondary | ICD-10-CM | POA: Diagnosis not present

## 2018-01-21 DIAGNOSIS — I10 Essential (primary) hypertension: Secondary | ICD-10-CM | POA: Diagnosis not present

## 2018-01-21 DIAGNOSIS — Z7982 Long term (current) use of aspirin: Secondary | ICD-10-CM | POA: Diagnosis not present

## 2018-01-21 DIAGNOSIS — E119 Type 2 diabetes mellitus without complications: Secondary | ICD-10-CM | POA: Insufficient documentation

## 2018-01-21 DIAGNOSIS — R1084 Generalized abdominal pain: Secondary | ICD-10-CM | POA: Diagnosis not present

## 2018-01-21 DIAGNOSIS — Z7984 Long term (current) use of oral hypoglycemic drugs: Secondary | ICD-10-CM | POA: Diagnosis not present

## 2018-01-21 DIAGNOSIS — R109 Unspecified abdominal pain: Secondary | ICD-10-CM | POA: Diagnosis not present

## 2018-01-21 LAB — LIPASE, BLOOD: Lipase: 34 U/L (ref 11–51)

## 2018-01-21 LAB — URINALYSIS, ROUTINE W REFLEX MICROSCOPIC
BILIRUBIN URINE: NEGATIVE
Bacteria, UA: NONE SEEN
GLUCOSE, UA: NEGATIVE mg/dL
Ketones, ur: NEGATIVE mg/dL
Leukocytes, UA: NEGATIVE
NITRITE: NEGATIVE
PH: 6 (ref 5.0–8.0)
Protein, ur: NEGATIVE mg/dL
SPECIFIC GRAVITY, URINE: 1.015 (ref 1.005–1.030)

## 2018-01-21 LAB — CBC
HCT: 41.1 % (ref 39.0–52.0)
Hemoglobin: 14.1 g/dL (ref 13.0–17.0)
MCH: 31.9 pg (ref 26.0–34.0)
MCHC: 34.3 g/dL (ref 30.0–36.0)
MCV: 93 fL (ref 78.0–100.0)
PLATELETS: 256 10*3/uL (ref 150–400)
RBC: 4.42 MIL/uL (ref 4.22–5.81)
RDW: 12.6 % (ref 11.5–15.5)
WBC: 11.8 10*3/uL — ABNORMAL HIGH (ref 4.0–10.5)

## 2018-01-21 LAB — COMPREHENSIVE METABOLIC PANEL
ALK PHOS: 45 U/L (ref 38–126)
ALT: 27 U/L (ref 17–63)
AST: 28 U/L (ref 15–41)
Albumin: 4 g/dL (ref 3.5–5.0)
Anion gap: 9 (ref 5–15)
BILIRUBIN TOTAL: 0.3 mg/dL (ref 0.3–1.2)
BUN: 18 mg/dL (ref 6–20)
CALCIUM: 9.1 mg/dL (ref 8.9–10.3)
CO2: 26 mmol/L (ref 22–32)
CREATININE: 0.92 mg/dL (ref 0.61–1.24)
Chloride: 104 mmol/L (ref 101–111)
GFR calc non Af Amer: >60 mL/min (ref >60)
Glucose, Bld: 150 mg/dL — ABNORMAL HIGH (ref 65–99)
Potassium: 4.3 mmol/L (ref 3.5–5.1)
SODIUM: 139 mmol/L (ref 135–145)
Total Protein: 7.4 g/dL (ref 6.5–8.1)

## 2018-01-21 LAB — I-STAT CG4 LACTIC ACID, ED: LACTIC ACID, VENOUS: 1.34 mmol/L (ref 0.5–1.9)

## 2018-01-21 MED ORDER — IOPAMIDOL (ISOVUE-300) INJECTION 61%
100.0000 mL | Freq: Once | INTRAVENOUS | Status: AC | PRN
  Administered 2018-01-22: 100 mL via INTRAVENOUS

## 2018-01-21 NOTE — ED Triage Notes (Signed)
Pt reports a sharp generalized abdominal pain that started around 3p. He reports that it has eased up at time of triage. Reports that the last time he had these symptoms he had a bowel obstruction. A&Ox4. Ambulatory. LBM was today- not diarrhea. Denies N/V.

## 2018-01-21 NOTE — ED Notes (Signed)
Pt with hx of SBO c/o mid abd pain. He stated that it doesn't feel like when he had the SBO. He denies n/v/d.

## 2018-01-21 NOTE — ED Provider Notes (Signed)
[facility] DEPT Provider Note   CSN: [id number] Arrival date & time: 01/21/18  1818     History   Chief Complaint Chief Complaint  Patient presents with  . Abdominal Pain    HPI [name] is a 83 y.o. male with PMH/o atrial fib, DM, SBO, who presents for evaluation of generalized abdominal pain that began approximately 3 PM this afternoon.  Patient reports that initially pain was more severe and states it is improved since ED arrival.  Patient reports it is generalized and not focal to one particular spot.  He reports no alleviating or aggravating factors.  He did not take any medication for the pain.  He states he has not had any nausea/vomiting but does report that he is had decreased appetite.  His last bowel movement was approximately 1 PM this afternoon.  Patient states that he has a history of small bowel obstructions and states that his pain was very similar to his previous episodes of bowel obstruction.  Patient denies any fever, chest pain, difficulty breathing, dysuria, hematuria, vomiting.  The history is provided by the patient.    Past Medical History:  Diagnosis Date  . Arthritis   . Atrial fibrillation ([place])    post op; amiodarone and coumadin continued for 3 mos post op  . CARCINOMA, SKIN, SQUAMOUS CELL 01/07/2010  . Cataract    beginning of cataracts  . COLONIC POLYPS, HX OF 04/26/2007  . Coronary artery disease    s/p CABG 3/12: L-LAD, S-CFX (Dr. Roxy Manns); EF 45% at cath prior to CABG  . DIABETES MELLITUS, TYPE II 04/26/2007  . Hyperlipidemia   . HYPERTENSION 04/26/2007  . NEPHROLITHIASIS, HX OF 04/26/2007  . SBO (small bowel obstruction) ([place]) 10/2015   Partial   . Shortness of breath dyspnea    with exertion  . Unspecified hearing loss 12/18/2008    Patient Active Problem List   Diagnosis Date Noted  . Well adult exam 07/02/2017  . Abdominal pain 07/02/2017  . Constipation 02/18/2016  . Small bowel obstruction ([place])  11/06/2015  . Back pain 05/08/2015  . Cough 10/17/2014  . Dyslipidemia 02/09/2011  . CAD (coronary artery disease), native coronary artery 02/09/2011  . Afib ([place]) 01/20/2011  . CARCINOMA, SKIN, SQUAMOUS CELL 01/07/2010  . UNSPECIFIED HEARING LOSS 12/18/2008  . Diabetes mellitus, type 2 ([place]) 04/26/2007  . Essential hypertension 04/26/2007  . COLONIC POLYPS, HX OF 04/26/2007  . NEPHROLITHIASIS, HX OF 04/26/2007    Past Surgical History:  Procedure Laterality Date  . BACK SURGERY     disectomy  . COLONOSCOPY    . CORONARY ARTERY BYPASS GRAFT  01/12/2011  . LUMBAR LAMINECTOMY/DECOMPRESSION MICRODISCECTOMY N/A 05/08/2015   Procedure: LUMBAR LAMINECTOMY/DECOMPRESSION MICRODISCECTOMY 1 LEVEL Lumbar four five;  Surgeon: Melina Schools, MD;  Location: [facility];  Service: Orthopedics;  Laterality: N/A;  . TOTAL HIP ARTHROPLASTY Left    left on 04/15/07  . TYMPANIC MEMBRANE REPAIR  1981       Home Medications    Prior to Admission medications   Medication Sig Start Date End Date Taking? Authorizing Provider  ACCU-CHEK FASTCLIX LANCETS MISC Use to help check blood sugars twice a day 04/08/17  Yes [name], Evie Lacks, MD  amLODipine (NORVASC) 5 MG tablet TAKE 1 TABLET (5 MG TOTAL) BY MOUTH DAILY. 02/18/17  Yes Fay Records, MD  aspirin 81 MG tablet Take 81 mg by mouth daily.    Yes [provider]  Blood Glucose Monitoring Suppl (ACCU-CHEK  AVIVA PLUS) w/Device KIT Use as directed 04/08/17  Yes [name], Evie Lacks, MD  cholecalciferol (VITAMIN D) 1000 UNITS tablet Take 1,000 Units by mouth 2 (two) times daily.    Yes [provider]  glucose blood (ACCU-CHEK AVIVA PLUS) test strip 1 each by Other route 2 (two) times daily. Use to check blood sugars twice a day 04/08/17  Yes [name], Evie Lacks, MD  ibuprofen (ADVIL,MOTRIN) 200 MG tablet Take 400 mg by mouth every 6 (six) hours as needed (pain).   Yes [provider]  metFORMIN (GLUCOPHAGE) 500 MG tablet Take 1 tablet  (500 mg total) by mouth 2 (two) times daily with a meal. 01/05/18  Yes [name], Evie Lacks, MD  metoprolol tartrate (LOPRESSOR) 25 MG tablet Take 1 tablet (25 mg total) by mouth daily. Please make yearly appt with Dr. Harrington Challenger for March. 1st attempt 10/28/17  Yes Fay Records, MD  repaglinide (PRANDIN) 1 MG tablet Take 1 tablet (1 mg total) by mouth 3 (three) times daily before meals. 03/29/17  Yes [name], Evie Lacks, MD  simvastatin (ZOCOR) 20 MG tablet TAKE 1/2 TABLET BY MOUTH EVERY DAY AT BEDTIME 06/16/17  Yes Fay Records, MD  HYDROcodone-acetaminophen (NORCO/VICODIN) 5-325 MG tablet Take 1 tablet by mouth every 6 (six) hours as needed for severe pain. Patient not taking: Reported on 01/21/2018 07/01/17   Caccavale, Sophia, PA-C  ondansetron (ZOFRAN) 4 MG tablet Take 1 tablet (4 mg total) by mouth every 8 (eight) hours as needed for nausea or vomiting. Patient not taking: Reported on 01/21/2018 07/01/17   Caccavale, Jillyn Ledger, PA-C    Family History Family History  Problem Relation Age of Onset  . Heart failure Mother   . Heart disease Father   . Cancer Sister   . Cancer Brother   . Heart disease Brother   . Breast cancer Sister   . Colon cancer Neg Hx     Social History Social History   Tobacco Use  . Smoking status: Former Smoker    Last attempt to quit: 01/02/1981    Years since quitting: 37.0  . Smokeless tobacco: Never Used  Substance Use Topics  . Alcohol use: No    Alcohol/week: 0.0 oz  . Drug use: No     Allergies   Patient has no known allergies.   Review of Systems Review of Systems  Constitutional: Negative for chills and fever.  Respiratory: Negative for cough and shortness of breath.   Cardiovascular: Negative for chest pain.  Gastrointestinal: Positive for abdominal pain. Negative for diarrhea, nausea and vomiting.  Genitourinary: Negative for dysuria and hematuria.  Musculoskeletal: Negative for back pain and neck pain.  Skin: Negative for rash.  Neurological:  Negative for dizziness, weakness, numbness and headaches.  All other systems reviewed and are negative.    Physical Exam Updated Vital Signs BP (!) 167/80 (BP Location: Left Arm)   Pulse 68   Temp 97.9 F (36.6 C) (Oral)   Resp 18   Ht '5\' 8"'  (1.727 m)   Wt 102.1 kg (225 lb)   SpO2 95%   BMI 34.21 kg/m   Physical Exam  Constitutional: He is oriented to person, place, and time. He appears well-developed and well-nourished.  Sitting comfortably on examination table  HENT:  Head: Normocephalic and atraumatic.  Mouth/Throat: Oropharynx is clear and moist and mucous membranes are normal.  Eyes: Conjunctivae, EOM and lids are normal. Pupils are equal, round, and reactive to light.  Neck: Full passive range of motion  without pain.  Cardiovascular: Normal rate, regular rhythm, normal heart sounds and normal pulses. Exam reveals no gallop and no friction rub.  No murmur heard. Pulmonary/Chest: Effort normal and breath sounds normal.  No evidence of respiratory distress. Able to speak in full sentences without difficulty.   Abdominal: Soft. Normal appearance and bowel sounds are normal. There is generalized tenderness. There is no rigidity, no guarding and no CVA tenderness.  Abdomen is soft, nondistended.  He has diffuse generalized tenderness but no focal point.  No CVA tenderness bilaterally.  Musculoskeletal: Normal range of motion.  Neurological: He is alert and oriented to person, place, and time.  Skin: Skin is warm and dry. Capillary refill takes less than 2 seconds.  Psychiatric: He has a normal mood and affect. His speech is normal.  Nursing note and vitals reviewed.    ED Treatments / Results  Labs (all labs ordered are listed, but only abnormal results are displayed) Labs Reviewed  COMPREHENSIVE METABOLIC PANEL - Abnormal; Notable for the following components:      Result Value   Glucose, Bld 150 (*)    All other components within normal limits  CBC - Abnormal;  Notable for the following components:   WBC 11.8 (*)    All other components within normal limits  URINALYSIS, ROUTINE W REFLEX MICROSCOPIC - Abnormal; Notable for the following components:   Hgb urine dipstick SMALL (*)    Squamous Epithelial / LPF 0-5 (*)    All other components within normal limits  LIPASE, BLOOD  I-STAT CG4 LACTIC ACID, ED    EKG  EKG Interpretation None       Radiology Ct Abdomen Pelvis W Contrast  Result Date: 01/22/2018 CLINICAL DATA:  Acute onset of generalized abdominal pain. EXAM: CT ABDOMEN AND PELVIS WITH CONTRAST TECHNIQUE: Multidetector CT imaging of the abdomen and pelvis was performed using the standard protocol following bolus administration of intravenous contrast. CONTRAST:  123m ISOVUE-300 IOPAMIDOL (ISOVUE-300) INJECTION 61% COMPARISON:  CT of the abdomen and pelvis performed 07/01/2017 FINDINGS: Lower chest: The visualized lung bases are grossly clear. Diffuse coronary artery calcifications are seen. The patient is status post median sternotomy. The visualized portions of the mediastinum are unremarkable. Hepatobiliary: The liver is unremarkable in appearance. The gallbladder is unremarkable in appearance. The common bile duct remains normal in caliber. Pancreas: The pancreas is within normal limits. Spleen: The spleen is unremarkable in appearance. Adrenals/Urinary Tract: The adrenal glands are unremarkable in appearance. Nonspecific perinephric stranding is noted bilaterally. There is no evidence of hydronephrosis. No renal or ureteral stones are identified. Stomach/Bowel: The stomach is unremarkable in appearance. The small bowel is within normal limits. The appendix is normal in caliber, without evidence of appendicitis. The colon is unremarkable in appearance. Vascular/Lymphatic: Scattered calcification is seen along the abdominal aorta and its branches. The abdominal aorta is otherwise grossly unremarkable. The inferior vena cava is grossly  unremarkable. No retroperitoneal lymphadenopathy is seen. No pelvic sidewall lymphadenopathy is identified. Reproductive: The bladder is mildly distended and grossly unremarkable. The prostate is normal in size, with scattered calcification. Other: No additional soft tissue abnormalities are seen. Musculoskeletal: No acute osseous abnormalities are identified. The patient's left hip arthroplasty is unremarkable, with a screw extending into the left iliacus musculature. Multilevel vacuum phenomenon is noted along the lumbar spine. The visualized musculature is unremarkable in appearance. IMPRESSION: 1. No acute abnormality seen within the abdomen or pelvis. 2. Diffuse coronary artery calcifications. Aortic Atherosclerosis (ICD10-I70.0). Electronically Signed   By: JJacqulynn Cadet  Chang M.D.   On: 01/22/2018 00:50    Procedures Procedures (including critical care time)  Medications Ordered in ED Medications  iopamidol (ISOVUE-300) 61 % injection 100 mL (100 mLs Intravenous Contrast Given 01/22/18 0005)  iopamidol (ISOVUE-300) 61 % injection (  Contrast Given 01/22/18 0126)  sodium chloride 0.9 % injection (  Given by Other 01/22/18 0126)     Initial Impression / Assessment and Plan / ED Course  I have reviewed the triage vital signs and the nursing notes.  Pertinent labs & imaging results that were available during my care of the patient were reviewed by me and considered in my medical decision making (see chart for details).     38-year-old man with past medical history of A. fib, DM who presents for evaluation of generalized abdominal pain that began approximate 3 PM this afternoon.  No associated vomiting, vomiting, fever.  Last bowel movement was approximate 1 PM this afternoon.  History of a small bowel obstruction and states that the current symptoms feel similar.  No chest pain, difficulty breathing. Patient is afebrile, non-toxic appearing, sitting comfortably on examination table. Vital signs reviewed  and stable.  On exam, patient does have positive bowel sounds but does have diffuse generalized tenderness.  No CVA tenderness bilaterally.  Consider small bowel obstruction versus acute infectious etiology.  Patient does have a history of A. fib and is therefore at risk for mesenteric ischemia.  We will plan to get i-STAT lactic acid though given exam and presentation, low suspicion for mesenteric ischemia. Do not suspect aortic dissection given history/physical exam. Do not suspect ACS etiology.  Initial labs ordered at triage.  Will plan for CT abdomen pelvis evaluation.  CMP reviewed.  Slight hyperglycemia but otherwise unremarkable.  UA shows small amount of hemoglobin but otherwise negative for infectious etiology.  Lipase unremarkable.  CBC shows slight leukocytosis but no anemia.  I-STAT lactic acid is negative.  Will plan for CT abdomen pelvis.  CT abdomen pelvis reviewed.  Negative for any bowel obstruction.  No acute abnormality.  Discussed results with Dr. Zenia Resides who independently evaluated patient.     Discussed results with patient.  Repeat abdominal exam is improved.  No rigidity, guarding, concern for bowel perforation.  Vital signs are stable.  He is tolerating p.o. in the department. Patient had ample opportunity for questions and discussion. All patient's questions were answered with full understanding. Strict return precautions discussed. Patient expresses understanding and agreement to plan.   Final Clinical Impressions(s) / ED Diagnoses   Final diagnoses:  Generalized abdominal pain    ED Discharge Orders    None       Volanda Napoleon, PA-C 01/22/18 0201

## 2018-01-22 DIAGNOSIS — R109 Unspecified abdominal pain: Secondary | ICD-10-CM | POA: Diagnosis not present

## 2018-01-22 MED ORDER — IOPAMIDOL (ISOVUE-300) INJECTION 61%
INTRAVENOUS | Status: AC
  Administered 2018-01-22: 01:00:00
  Filled 2018-01-22: qty 100

## 2018-01-22 MED ORDER — SODIUM CHLORIDE 0.9 % IJ SOLN
INTRAMUSCULAR | Status: AC
  Administered 2018-01-22: 01:00:00
  Filled 2018-01-22: qty 50

## 2018-01-22 NOTE — ED Notes (Signed)
Pt given water for PO challenge 

## 2018-01-22 NOTE — ED Notes (Signed)
Will update patient's vital signs once pt returns from CT

## 2018-01-22 NOTE — Discharge Instructions (Signed)
Follow-up with your primary care doctor in the next 24-48 hours for further evaluation.  He did take Tylenol as needed for pain.  Return to the emergency department for any worsening pain, fever, chest pain, difficulty breathing, nausea/vomiting or any other worsening or concerning symptoms.

## 2018-01-26 ENCOUNTER — Encounter: Payer: Self-pay | Admitting: Internal Medicine

## 2018-01-26 ENCOUNTER — Other Ambulatory Visit: Payer: Self-pay | Admitting: Internal Medicine

## 2018-01-28 ENCOUNTER — Encounter: Payer: Self-pay | Admitting: Internal Medicine

## 2018-02-07 ENCOUNTER — Ambulatory Visit: Payer: Medicare Other | Admitting: Internal Medicine

## 2018-02-07 ENCOUNTER — Encounter: Payer: Self-pay | Admitting: Internal Medicine

## 2018-02-07 VITALS — BP 124/72 | HR 73 | Ht 68.0 in | Wt 235.4 lb

## 2018-02-07 DIAGNOSIS — E782 Mixed hyperlipidemia: Secondary | ICD-10-CM | POA: Diagnosis not present

## 2018-02-07 DIAGNOSIS — I10 Essential (primary) hypertension: Secondary | ICD-10-CM | POA: Diagnosis not present

## 2018-02-07 DIAGNOSIS — I2510 Atherosclerotic heart disease of native coronary artery without angina pectoris: Secondary | ICD-10-CM | POA: Diagnosis not present

## 2018-02-07 DIAGNOSIS — E785 Hyperlipidemia, unspecified: Secondary | ICD-10-CM

## 2018-02-07 DIAGNOSIS — R2681 Unsteadiness on feet: Secondary | ICD-10-CM | POA: Diagnosis not present

## 2018-02-07 NOTE — Patient Instructions (Signed)
Your physician recommends that you continue on your current medications as directed. Please refer to the Current Medication list given to you today. Your physician recommends that you return for lab work today (lipids).  Your physician wants you to follow-up in: November, 2019 with Dr. Harrington Challenger.  You will receive a reminder letter in the mail two months in advance. If you don't receive a letter, please call our office to schedule the follow-up appointment.

## 2018-02-07 NOTE — Progress Notes (Signed)
Cardiology Office Note   Date:  02/07/2018   ID:  KELLYN MANSFIELD, DOB 11-Mar-1935, MRN [id number]  PCP:  Cassandria Anger, MD  Cardiologist:   [name], MD   F/u of CAD      History of Present Illness: [name] is a 83 y.o. male with a history ofCAD, status post CABG 01/2011, diabetes, hypertension, nephrolithiasis, hyperlipidemia, postoperative atrial fibrillation. LHC 01/12/11 prior to his CABG demonstrated left main and severe double vessel CAD. EF 45%. He had no significant disease in the RCA. [place] at the time of his CABG: LIMA-distal LAD, SVG-circumflex. He remained on Coumadin and amiodarone for some time postoperatively b/c of postoperative atrial fibrillation Since I saw him he says his breathing is stable  Still gets SOB with some activities  But he is busy all day   Takes care of house He denies chest pain   He does say he has fallen   Is unsteady on feet  Does not exercise    Current Meds  Medication Sig  . ACCU-CHEK FASTCLIX LANCETS MISC Use to help check blood sugars twice a day  . amLODipine (NORVASC) 5 MG tablet TAKE 1 TABLET (5 MG TOTAL) BY MOUTH DAILY.  Marland Kitchen aspirin 81 MG tablet Take 81 mg by mouth daily.   . Blood Glucose Monitoring Suppl (ACCU-CHEK AVIVA PLUS) w/Device KIT Use as directed  . cholecalciferol (VITAMIN D) 1000 units tablet Take 1,000 Units by mouth 2 (two) times daily.  Marland Kitchen glucose blood (ACCU-CHEK AVIVA PLUS) test strip 1 each by Other route 2 (two) times daily. Use to check blood sugars twice a day  . ibuprofen (ADVIL,MOTRIN) 200 MG tablet Take 400 mg by mouth every 6 (six) hours as needed (pain).  . metFORMIN (GLUCOPHAGE) 500 MG tablet Take 1 tablet (500 mg total) by mouth 2 (two) times daily with a meal.  . metoprolol tartrate (LOPRESSOR) 25 MG tablet Take 1 tablet (25 mg total) by mouth daily. Please keep upcoming appt for future refills. Thank you  . repaglinide (PRANDIN) 1 MG tablet Take 1 tablet (1 mg total) by mouth 3 (three)  times daily before meals.  . simvastatin (ZOCOR) 20 MG tablet TAKE 1/2 TABLET BY MOUTH EVERY DAY AT BEDTIME     Allergies:   Patient has no known allergies.   Past Medical History:  Diagnosis Date  . Arthritis   . Atrial fibrillation ([place])    post op; amiodarone and coumadin continued for 3 mos post op  . CARCINOMA, SKIN, SQUAMOUS CELL 01/07/2010  . Cataract    beginning of cataracts  . COLONIC POLYPS, HX OF 04/26/2007  . Coronary artery disease    s/p CABG 3/12: L-LAD, S-CFX (Dr. Roxy Manns); EF 45% at cath prior to CABG  . DIABETES MELLITUS, TYPE II 04/26/2007  . Hyperlipidemia   . HYPERTENSION 04/26/2007  . NEPHROLITHIASIS, HX OF 04/26/2007  . SBO (small bowel obstruction) ([place]) 10/2015   Partial   . Shortness of breath dyspnea    with exertion  . Unspecified hearing loss 12/18/2008    Past Surgical History:  Procedure Laterality Date  . BACK SURGERY     disectomy  . COLONOSCOPY    . CORONARY ARTERY BYPASS GRAFT  01/12/2011  . LUMBAR LAMINECTOMY/DECOMPRESSION MICRODISCECTOMY N/A 05/08/2015   Procedure: LUMBAR LAMINECTOMY/DECOMPRESSION MICRODISCECTOMY 1 LEVEL Lumbar four five;  Surgeon: Melina Schools, MD;  Location: [facility];  Service: Orthopedics;  Laterality: N/A;  . TOTAL HIP ARTHROPLASTY Left    left on  04/15/07  . TYMPANIC MEMBRANE REPAIR  1981     Social History:  The patient  reports that he quit smoking about 37 years ago. He has never used smokeless tobacco. He reports that he does not drink alcohol or use drugs.   Family History:  The patient's family history includes Breast cancer in his sister; Cancer in his brother and sister; Heart disease in his brother and father; Heart failure in his mother.    ROS:  Please see the history of present illness. All other systems are reviewed and  Negative to the above problem except as noted.    PHYSICAL EXAM: VS:  BP 124/72   Pulse 73   Ht '5\' 8"'  (1.727 m)   Wt 235 lb 6.4 oz (106.8 kg)   SpO2 93%   BMI 35.79 kg/m   GEN:   Morbidly obese 83 yo in no acute distress  HEENT: normal  Neck: JVP is normal  No carotid bruits, or masses Cardiac: RRR; no murmurs, rubs, or gallops,no edema  Respiratory:  clear to auscultation bilaterally, normal work of breathing GI: soft, nontender, nondistended, + BS  No hepatomegaly  MS: no deformity Moving all extremities   Skin: warm and dry, no rash Neuro:  Strength and sensation are intact Psych: euthymic mood, full affect   EKG:  EKG is not ordered today    Lipid Panel    Component Value Date/Time   CHOL 116 01/22/2017 1418   TRIG 158 (H) 01/22/2017 1418   TRIG 78 11/05/2006 1123   HDL 43 01/22/2017 1418   CHOLHDL 2.7 01/22/2017 1418   CHOLHDL 2.5 01/17/2016 1209   VLDL 20 01/17/2016 1209   LDLCALC 41 01/22/2017 1418      Wt Readings from Last 3 Encounters:  02/07/18 235 lb 6.4 oz (106.8 kg)  01/21/18 225 lb (102.1 kg)  01/05/18 240 lb (108.9 kg)      ASSESSMENT AND PLAN:  1  CAD   Pt s/p CABG in 2012  I do not think he is having any active angina   Follow   2  HL  Check lipids today    3DM  Continue meds   Watch diet    4  HTN  BP is OK  5  Morbid obesity   Needs to lose wt  6  Unsteadiness  Told patient about balance rehab   He is reluctant to go   Encouraged him to increase activities at home  Strenght training, walking    F/U in 1 year     Current medicines are reviewed at length with the patient today.  The patient does not have concerns regarding medicines.  Signed, [name], MD  02/07/2018 2:41 PM    [address], [place], [place]  [address] Phone: [phone]; Fax: [phone]

## 2018-02-08 LAB — LIPID PANEL
CHOL/HDL RATIO: 2.3 ratio (ref 0.0–5.0)
Cholesterol, Total: 101 mg/dL (ref 100–199)
HDL: 43 mg/dL (ref >39)
LDL Calculated: 35 mg/dL (ref 0–99)
Triglycerides: 117 mg/dL (ref 0–149)
VLDL Cholesterol Cal: 23 mg/dL (ref 5–40)

## 2018-02-12 ENCOUNTER — Other Ambulatory Visit: Payer: Self-pay | Admitting: Internal Medicine

## 2018-02-12 ENCOUNTER — Emergency Department (HOSPITAL_COMMUNITY)
Admission: EM | Admit: 2018-02-12 | Discharge: 2018-02-12 | Disposition: A | Discharge status: Other Acute Inpatient Hospital | Payer: Medicare Other

## 2018-02-12 NOTE — ED Notes (Signed)
Pt spoke to this writer, and said that his abd pain has eased off and would like to go home.

## 2018-02-25 ENCOUNTER — Other Ambulatory Visit: Payer: Self-pay | Admitting: Internal Medicine

## 2018-04-04 ENCOUNTER — Encounter: Payer: Self-pay | Admitting: Internal Medicine

## 2018-04-04 ENCOUNTER — Other Ambulatory Visit (INDEPENDENT_AMBULATORY_CARE_PROVIDER_SITE_OTHER): Payer: Medicare Other

## 2018-04-04 ENCOUNTER — Ambulatory Visit (INDEPENDENT_AMBULATORY_CARE_PROVIDER_SITE_OTHER): Payer: Medicare Other | Admitting: Internal Medicine

## 2018-04-04 VITALS — BP 126/72 | HR 70 | Temp 98.1°F | Ht 68.0 in | Wt 237.0 lb

## 2018-04-04 DIAGNOSIS — K56609 Unspecified intestinal obstruction, unspecified as to partial versus complete obstruction: Secondary | ICD-10-CM

## 2018-04-04 DIAGNOSIS — E1159 Type 2 diabetes mellitus with other circulatory complications: Secondary | ICD-10-CM

## 2018-04-04 DIAGNOSIS — R1011 Right upper quadrant pain: Secondary | ICD-10-CM | POA: Diagnosis not present

## 2018-04-04 DIAGNOSIS — R42 Dizziness and giddiness: Secondary | ICD-10-CM | POA: Diagnosis not present

## 2018-04-04 DIAGNOSIS — I10 Essential (primary) hypertension: Secondary | ICD-10-CM | POA: Diagnosis not present

## 2018-04-04 LAB — MICROALBUMIN / CREATININE URINE RATIO
CREATININE, U: 63.4 mg/dL
MICROALB UR: 0.7 mg/dL (ref 0.0–1.9)
Microalb Creat Ratio: 1.2 mg/g (ref 0.0–30.0)

## 2018-04-04 LAB — URINALYSIS
Bilirubin Urine: NEGATIVE
Hgb urine dipstick: NEGATIVE
KETONES UR: NEGATIVE
Leukocytes, UA: NEGATIVE
Nitrite: NEGATIVE
SPECIFIC GRAVITY, URINE: 1.015 (ref 1.000–1.030)
TOTAL PROTEIN, URINE-UPE24: NEGATIVE
URINE GLUCOSE: NEGATIVE
Urobilinogen, UA: 0.2 (ref 0.0–1.0)
pH: 7 (ref 5.0–8.0)

## 2018-04-04 LAB — HEMOGLOBIN A1C: HEMOGLOBIN A1C: 6.6 % — AB (ref 4.6–6.5)

## 2018-04-04 LAB — BASIC METABOLIC PANEL
BUN: 18 mg/dL (ref 6–23)
CO2: 26 mEq/L (ref 19–32)
CREATININE: 0.98 mg/dL (ref 0.40–1.50)
Calcium: 9.3 mg/dL (ref 8.4–10.5)
Chloride: 105 mEq/L (ref 96–112)
GFR: 77.98 mL/min (ref >60.00)
Glucose, Bld: 138 mg/dL — ABNORMAL HIGH (ref 70–99)
POTASSIUM: 3.9 meq/L (ref 3.5–5.1)
Sodium: 141 mEq/L (ref 135–145)

## 2018-04-04 NOTE — Progress Notes (Signed)
Subjective:  Patient ID: [name], male    DOB: 1935-09-17  Age: 83 y.o. MRN: [id number]  CC: No chief complaint on file.   HPI [name] presents for RUQ pain off and on over past few days 4-5/10. He had a dizzy spell (spinning) on Sat x few seconds. CT w/atherosclerosis in 3/19  Outpatient Medications Prior to Visit  Medication Sig Dispense Refill  . ACCU-CHEK FASTCLIX LANCETS MISC Use to help check blood sugars twice a day 306 each 1  . amLODipine (NORVASC) 5 MG tablet TAKE 1 TABLET (5 MG TOTAL) BY MOUTH DAILY. 30 tablet 11  . aspirin 81 MG tablet Take 81 mg by mouth daily.     . Blood Glucose Monitoring Suppl (ACCU-CHEK AVIVA PLUS) w/Device KIT Use as directed 1 kit 0  . cholecalciferol (VITAMIN D) 1000 units tablet Take 1,000 Units by mouth 2 (two) times daily.    Marland Kitchen glucose blood (ACCU-CHEK AVIVA PLUS) test strip 1 each by Other route 2 (two) times daily. Use to check blood sugars twice a day 300 each 1  . ibuprofen (ADVIL,MOTRIN) 200 MG tablet Take 400 mg by mouth every 6 (six) hours as needed (pain).    . metFORMIN (GLUCOPHAGE) 500 MG tablet Take 1 tablet (500 mg total) by mouth 2 (two) times daily with a meal. 270 tablet 2  . metoprolol tartrate (LOPRESSOR) 25 MG tablet TAKE 1 TABLET BY MOUTH DAILY. PLEASE KEEP UPCOMING APPT FOR FUTURE REFILLS. THANK YOU 30 tablet 10  . repaglinide (PRANDIN) 1 MG tablet Take 1 tablet (1 mg total) by mouth 3 (three) times daily before meals. 90 tablet 11  . simvastatin (ZOCOR) 20 MG tablet TAKE 1/2 TABLET BY MOUTH EVERY DAY AT BEDTIME 45 tablet 2   No facility-administered medications prior to visit.     ROS Review of Systems  Constitutional: Negative for appetite change, fatigue and unexpected weight change.  HENT: Negative for congestion, nosebleeds, sneezing, sore throat and trouble swallowing.   Eyes: Negative for itching and visual disturbance.  Respiratory: Negative for cough.   Cardiovascular: Negative for chest  pain, palpitations and leg swelling.  Gastrointestinal: Positive for abdominal pain. Negative for abdominal distention, blood in stool, diarrhea and nausea.  Genitourinary: Negative for frequency and hematuria.  Musculoskeletal: Negative for back pain, gait problem, joint swelling and neck pain.  Skin: Negative for rash.  Neurological: Positive for dizziness. Negative for tremors, speech difficulty and weakness.  Psychiatric/Behavioral: Negative for agitation, dysphoric mood and sleep disturbance. The patient is not nervous/anxious.     Objective:  BP 126/72 (BP Location: Left Arm, Patient Position: Sitting, Cuff Size: Large)   Pulse 70   Temp 98.1 F (36.7 C) (Oral)   Ht _0  (1.727 m)   Wt 237 lb (107.5 kg)   SpO2 98%   BMI 36.04 kg/m   BP Readings from Last 3 Encounters:  04/04/18 126/72  02/07/18 124/72  01/22/18 (!) 167/80    Wt Readings from Last 3 Encounters:  04/04/18 237 lb (107.5 kg)  02/07/18 235 lb 6.4 oz (106.8 kg)  01/21/18 225 lb (102.1 kg)    Physical Exam  Constitutional: He is oriented to person, place, and time. He appears well-developed. No distress.  NAD  HENT:  Mouth/Throat: Oropharynx is clear and moist.  Eyes: Pupils are equal, round, and reactive to light. Conjunctivae are normal.  Neck: Normal range of motion. No JVD present. No thyromegaly present.  Cardiovascular: Normal rate, regular rhythm, normal heart  sounds and intact distal pulses. Exam reveals no gallop and no friction rub.  No murmur heard. Pulmonary/Chest: Effort normal and breath sounds normal. No respiratory distress. He has no wheezes. He has no rales. He exhibits no tenderness.  Abdominal: Soft. Bowel sounds are normal. He exhibits no distension and no mass. There is no tenderness. There is no rebound and no guarding.  Musculoskeletal: Normal range of motion. He exhibits no edema or tenderness.  Lymphadenopathy:    He has no cervical adenopathy.  Neurological: He is alert and  oriented to person, place, and time. He has normal reflexes. No cranial nerve deficit. He exhibits normal muscle tone. He displays a negative Romberg sign. Coordination and gait normal.  Skin: Skin is warm and dry. No rash noted.  Psychiatric: He has a normal mood and affect. His behavior is normal. Judgment and thought content normal.   No pronator drift, no nystagmus Lab Results  Component Value Date   WBC 11.8 (H) 01/21/2018   HGB 14.1 01/21/2018   HCT 41.1 01/21/2018   PLT 256 01/21/2018   GLUCOSE 138 (H) 04/04/2018   CHOL 101 02/07/2018   TRIG 117 02/07/2018   HDL 43 02/07/2018   LDLCALC 35 02/07/2018   ALT 27 01/21/2018   AST 28 01/21/2018   NA 141 04/04/2018   K 3.9 04/04/2018   CL 105 04/04/2018   CREATININE 0.98 04/04/2018   BUN 18 04/04/2018   CO2 26 04/04/2018   TSH 2.05 07/02/2017   PSA 1.18 07/02/2017   INR 2.3 03/03/2012   HGBA1C 6.6 (H) 04/04/2018    Ct Abdomen Pelvis W Contrast  Result Date: 01/22/2018 CLINICAL DATA:  Acute onset of generalized abdominal pain. EXAM: CT ABDOMEN AND PELVIS WITH CONTRAST TECHNIQUE: Multidetector CT imaging of the abdomen and pelvis was performed using the standard protocol following bolus administration of intravenous contrast. CONTRAST:  169m ISOVUE-300 IOPAMIDOL (ISOVUE-300) INJECTION 61% COMPARISON:  CT of the abdomen and pelvis performed 07/01/2017 FINDINGS: Lower chest: The visualized lung bases are grossly clear. Diffuse coronary artery calcifications are seen. The patient is status post median sternotomy. The visualized portions of the mediastinum are unremarkable. Hepatobiliary: The liver is unremarkable in appearance. The gallbladder is unremarkable in appearance. The common bile duct remains normal in caliber. Pancreas: The pancreas is within normal limits. Spleen: The spleen is unremarkable in appearance. Adrenals/Urinary Tract: The adrenal glands are unremarkable in appearance. Nonspecific perinephric stranding is noted  bilaterally. There is no evidence of hydronephrosis. No renal or ureteral stones are identified. Stomach/Bowel: The stomach is unremarkable in appearance. The small bowel is within normal limits. The appendix is normal in caliber, without evidence of appendicitis. The colon is unremarkable in appearance. Vascular/Lymphatic: Scattered calcification is seen along the abdominal aorta and its branches. The abdominal aorta is otherwise grossly unremarkable. The inferior vena cava is grossly unremarkable. No retroperitoneal lymphadenopathy is seen. No pelvic sidewall lymphadenopathy is identified. Reproductive: The bladder is mildly distended and grossly unremarkable. The prostate is normal in size, with scattered calcification. Other: No additional soft tissue abnormalities are seen. Musculoskeletal: No acute osseous abnormalities are identified. The patient's left hip arthroplasty is unremarkable, with a screw extending into the left iliacus musculature. Multilevel vacuum phenomenon is noted along the lumbar spine. The visualized musculature is unremarkable in appearance. IMPRESSION: 1. No acute abnormality seen within the abdomen or pelvis. 2. Diffuse coronary artery calcifications. Aortic Atherosclerosis (ICD10-I70.0). Electronically Signed   By: JGarald BaldingM.D.   On: 01/22/2018 00:50  Assessment & Plan:   There are no diagnoses linked to this encounter. I am having Jerre Simon maintain his aspirin, repaglinide, ibuprofen, ACCU-CHEK FASTCLIX LANCETS, glucose blood, ACCU-CHEK AVIVA PLUS, simvastatin, metFORMIN, cholecalciferol, amLODipine, and metoprolol tartrate.  No orders of the defined types were placed in this encounter.    Follow-up: No follow-ups on file.  Walker Kehr, MD

## 2018-04-04 NOTE — Assessment & Plan Note (Addendum)
Labs are better Metformin, Prandin

## 2018-04-04 NOTE — Assessment & Plan Note (Addendum)
RUQ pain off and on over past few days 4-5/10. CT w/atherosclerosis in 3/19 Labs GI ref Dr Fuller Plan if relapsed

## 2018-04-04 NOTE — Assessment & Plan Note (Addendum)
No SBO sx's  RUQ pain off and on over past few days 4-5/10. CT w/atherosclerosis in 3/19

## 2018-04-04 NOTE — Assessment & Plan Note (Signed)
Spinning x seconds. No relapse Will watch

## 2018-04-06 ENCOUNTER — Other Ambulatory Visit: Payer: Self-pay | Admitting: Internal Medicine

## 2018-05-09 ENCOUNTER — Ambulatory Visit: Payer: Self-pay | Admitting: Internal Medicine

## 2018-05-09 NOTE — Telephone Encounter (Signed)
Pt calling with SOB. He stated he was mowing the grass and the SOB was so bad that he thought he was going to have to sit down on the ground. During the call pt is SOB at rest and speaks in phrases. According to pt he has a h/o being SOB. He has chest congestion and a cough. He feels the chest congestion is clearing up. Called PCP office regarding disposition and in agreement to advise pt to go to the ED.  Reason for Disposition . [1] MODERATE difficulty breathing (e.g., speaks in phrases, SOB even at rest, pulse 100-120) AND [2] NEW-onset or WORSE than normal  Answer Assessment - Initial Assessment Questions 1. RESPIRATORY STATUS: "Describe your breathing?" (e.g., wheezing, shortness of breath, unable to speak, severe coughing)      Shortness of breath 2. ONSET: "When did this breathing problem begin?"      Has h/o SOB but has worsened 3. PATTERN "Does the difficult breathing come and go, or has it been constant since it started?"      Comes and goes worse with exertion 4. SEVERITY: "How bad is your breathing?" (e.g., mild, moderate, severe)    - MILD: No SOB at rest, mild SOB with walking, speaks normally in sentences, can lay down, no retractions, pulse < 100.    - MODERATE: SOB at rest, SOB with minimal exertion and prefers to sit, cannot lie down flat, speaks in phrases, mild retractions, audible wheezing, pulse 100-120.    - SEVERE: Very SOB at rest, speaks in single words, struggling to breathe, sitting hunched forward, retractions, pulse > 120      moderate 5. RECURRENT SYMPTOM: "Have you had difficulty breathing before?" If so, ask: "When was the last time?" and "What happened that time?"      Yes- has been SOB for a long time per pt but it never interfered like it is now 6. CARDIAC HISTORY: "Do you have any history of heart disease?" (e.g., heart attack, angina, bypass surgery, angioplasty)      Double bypass 2012 7. LUNG HISTORY: "Do you have any history of lung disease?"  (e.g.,  pulmonary embolus, asthma, emphysema)     no 8. CAUSE: "What do you think is causing the breathing problem?"      Pt does not know 9. OTHER SYMPTOMS: "Do you have any other symptoms? (e.g., dizziness, runny nose, cough, chest pain, fever)     Runny nose, chest congestion with cough- states he is getting over the congestion 10. PREGNANCY: "Is there any chance you are pregnant?" "When was your last menstrual period?"       n/a 11. TRAVEL: "Have you traveled out of the country in the last month?" (e.g., travel history, exposures)       no  Protocols used: BREATHING DIFFICULTY-A-AH

## 2018-05-10 ENCOUNTER — Emergency Department (HOSPITAL_COMMUNITY): Payer: Medicare Other

## 2018-05-10 ENCOUNTER — Other Ambulatory Visit: Payer: Self-pay

## 2018-05-10 ENCOUNTER — Emergency Department (HOSPITAL_COMMUNITY)
Admission: EM | Admit: 2018-05-10 | Discharge: 2018-05-10 | Disposition: A | Discharge status: Home / Self Care | Payer: Medicare Other | Attending: Emergency Medicine | Admitting: Emergency Medicine

## 2018-05-10 ENCOUNTER — Encounter (HOSPITAL_COMMUNITY): Payer: Self-pay

## 2018-05-10 DIAGNOSIS — I2510 Atherosclerotic heart disease of native coronary artery without angina pectoris: Secondary | ICD-10-CM | POA: Diagnosis not present

## 2018-05-10 DIAGNOSIS — R0602 Shortness of breath: Secondary | ICD-10-CM | POA: Insufficient documentation

## 2018-05-10 DIAGNOSIS — Z7982 Long term (current) use of aspirin: Secondary | ICD-10-CM | POA: Insufficient documentation

## 2018-05-10 DIAGNOSIS — I480 Paroxysmal atrial fibrillation: Secondary | ICD-10-CM | POA: Insufficient documentation

## 2018-05-10 DIAGNOSIS — R600 Localized edema: Secondary | ICD-10-CM | POA: Diagnosis not present

## 2018-05-10 DIAGNOSIS — I4891 Unspecified atrial fibrillation: Secondary | ICD-10-CM | POA: Diagnosis not present

## 2018-05-10 DIAGNOSIS — Z96642 Presence of left artificial hip joint: Secondary | ICD-10-CM | POA: Diagnosis not present

## 2018-05-10 DIAGNOSIS — Z79899 Other long term (current) drug therapy: Secondary | ICD-10-CM | POA: Diagnosis not present

## 2018-05-10 DIAGNOSIS — I10 Essential (primary) hypertension: Secondary | ICD-10-CM | POA: Diagnosis not present

## 2018-05-10 DIAGNOSIS — Z87891 Personal history of nicotine dependence: Secondary | ICD-10-CM | POA: Diagnosis not present

## 2018-05-10 DIAGNOSIS — R05 Cough: Secondary | ICD-10-CM | POA: Diagnosis not present

## 2018-05-10 LAB — BASIC METABOLIC PANEL
Anion gap: 11 (ref 5–15)
BUN: 15 mg/dL (ref 8–23)
CHLORIDE: 103 mmol/L (ref 98–111)
CO2: 23 mmol/L (ref 22–32)
CREATININE: 1.09 mg/dL (ref 0.61–1.24)
Calcium: 9.7 mg/dL (ref 8.9–10.3)
GFR calc Af Amer: >60 mL/min (ref >60)
GFR calc non Af Amer: >60 mL/min (ref >60)
GLUCOSE: 113 mg/dL — AB (ref 70–99)
POTASSIUM: 4.6 mmol/L (ref 3.5–5.1)
Sodium: 137 mmol/L (ref 135–145)

## 2018-05-10 LAB — CBC WITH DIFFERENTIAL/PLATELET
Abs Immature Granulocytes: 0 10*3/uL (ref 0.0–0.1)
Basophils Absolute: 0.1 10*3/uL (ref 0.0–0.1)
Basophils Relative: 1 %
EOS PCT: 1 %
Eosinophils Absolute: 0.1 10*3/uL (ref 0.0–0.7)
HEMATOCRIT: 41.3 % (ref 39.0–52.0)
HEMOGLOBIN: 13.6 g/dL (ref 13.0–17.0)
Immature Granulocytes: 0 %
LYMPHS ABS: 2.3 10*3/uL (ref 0.7–4.0)
LYMPHS PCT: 33 %
MCH: 31.1 pg (ref 26.0–34.0)
MCHC: 32.9 g/dL (ref 30.0–36.0)
MCV: 94.5 fL (ref 78.0–100.0)
MONO ABS: 0.7 10*3/uL (ref 0.1–1.0)
MONOS PCT: 9 %
Neutro Abs: 3.9 10*3/uL (ref 1.7–7.7)
Neutrophils Relative %: 56 %
Platelets: 257 10*3/uL (ref 150–400)
RBC: 4.37 MIL/uL (ref 4.22–5.81)
RDW: 13.5 % (ref 11.5–15.5)
WBC: 7 10*3/uL (ref 4.0–10.5)

## 2018-05-10 LAB — D-DIMER, QUANTITATIVE: D-Dimer, Quant: 1.18 ug/mL-FEU — ABNORMAL HIGH (ref 0.00–0.50)

## 2018-05-10 LAB — I-STAT TROPONIN, ED: Troponin i, poc: 0.01 ng/mL (ref 0.00–0.08)

## 2018-05-10 LAB — MAGNESIUM: MAGNESIUM: 2.5 mg/dL — AB (ref 1.7–2.4)

## 2018-05-10 LAB — BRAIN NATRIURETIC PEPTIDE: B Natriuretic Peptide: 169.9 pg/mL — ABNORMAL HIGH (ref 0.0–100.0)

## 2018-05-10 MED ORDER — FUROSEMIDE 10 MG/ML IJ SOLN
20.0000 mg | Freq: Once | INTRAMUSCULAR | Status: AC
  Administered 2018-05-10: 20 mg via INTRAVENOUS
  Filled 2018-05-10: qty 2

## 2018-05-10 MED ORDER — DILTIAZEM HCL 25 MG/5ML IV SOLN
20.0000 mg | Freq: Once | INTRAVENOUS | Status: AC
  Administered 2018-05-10: 20 mg via INTRAVENOUS
  Filled 2018-05-10: qty 5

## 2018-05-10 MED ORDER — APIXABAN 5 MG PO TABS: 5.0000 mg | ORAL_TABLET | Freq: Two times a day (BID) | ORAL | 0 refills | Status: DC

## 2018-05-10 MED ORDER — IOPAMIDOL (ISOVUE-370) INJECTION 76%
INTRAVENOUS | Status: AC
  Filled 2018-05-10: qty 100

## 2018-05-10 MED ORDER — IOPAMIDOL (ISOVUE-370) INJECTION 76%
100.0000 mL | Freq: Once | INTRAVENOUS | Status: AC | PRN
  Administered 2018-05-10: 100 mL via INTRAVENOUS

## 2018-05-10 MED ORDER — DILTIAZEM HCL ER COATED BEADS 240 MG PO TB24: 240.0000 mg | ORAL_TABLET | Freq: Every day | ORAL | 0 refills | Status: DC

## 2018-05-10 NOTE — ED Notes (Signed)
Spoke with Laura,PA in regards to diltiazem dose; updated with most recent BP that is 100/54 and hr is still going from 80's to 125's ; asked if we could decrease dose to 5 or 10 mg ; per PA and MD ok to give 20 mg of diltiazem

## 2018-05-10 NOTE — ED Notes (Signed)
spke w

## 2018-05-10 NOTE — ED Provider Notes (Signed)
Patient placed in Quick Look pathway, seen and evaluated   Chief Complaint: Shortness of breath  HPI:   Patient presents today for evaluation of Shortness of breath.  He was told by his doctor yesterday to come here but he had "things to do."  He reports increased shortness of breath   ROS: No chest pain  Physical Exam:   Gen: No distress  Neuro: Awake and Alert  Skin: Warm    Focused Exam: Lungs slightly diminished bilaterally.  Tachycardic.     Initiation of care has begun. The patient has been counseled on the process, plan, and necessity for staying for the completion/evaluation, and the remainder of the medical screening examination  Concern for A-fib RVR, back to room.    Lorin Glass, [place] 05/10/18 1314    Tegeler, Gwenyth Allegra, MD 05/10/18 [phone]

## 2018-05-10 NOTE — ED Provider Notes (Signed)
[facility] EMERGENCY DEPARTMENT Provider Note   CSN: [id number] Arrival date & time: 05/10/18  1305     History   Chief Complaint Chief Complaint  Patient presents with  . Shortness of Breath    HPI [name] is a 83 y.o. male.  83 year old male presents with complaint of shortness of breath.  Patient states that he has been short of breath for a long time, attributes this to growing up with a woodstove in the house as a child.  Patient states that shortness of breath has been worse for the past 3 weeks, reports shortness of breath with exertion and normally resolves with rest.  Patient states that he mowed his grass yesterday and felt short of breath, was tired of feeling short of breath for such a long time so he called his PCP office for an appointment and was told to come to the emergency room yesterday.  Patient states that his doctor's office would not see him for his ongoing shortness of breath he decided to come to the ER today.  He reports shortness of breath with exertion, denies chest pain, denies palpitations.  Patient reports getting over a minor cold recently, states he has had a cough productive with some yellow to brown-tinged sputum.  States that he has swelling in his legs which is unchanged.  Denies orthopnea.  Reports history of double bypass surgery in 2012, states he had atrial fibrillation after his surgery however this resolved and has not had any problems since then.  No history of arrhythmia otherwise, follows up with Dr. Harrington Challenger with cardiology.  Patient takes a baby aspirin daily, no other blood thinners.  Patient states that he smoked for 30 years and quit smoking 35 years ago.  No history of COPD, emphysema, asthma.  Does not use inhalers.  No other complaints or concerns.     Past Medical History:  Diagnosis Date  . Arthritis   . Atrial fibrillation ([place])    post op; amiodarone and coumadin continued for 3 mos post op  . CARCINOMA,  SKIN, SQUAMOUS CELL 01/07/2010  . Cataract    beginning of cataracts  . COLONIC POLYPS, HX OF 04/26/2007  . Coronary artery disease    s/p CABG 3/12: L-LAD, S-CFX (Dr. Roxy Manns); EF 45% at cath prior to CABG  . DIABETES MELLITUS, TYPE II 04/26/2007  . Hyperlipidemia   . HYPERTENSION 04/26/2007  . NEPHROLITHIASIS, HX OF 04/26/2007  . SBO (small bowel obstruction) ([place]) 10/2015   Partial   . Shortness of breath dyspnea    with exertion  . Unspecified hearing loss 12/18/2008    Patient Active Problem List   Diagnosis Date Noted  . RUQ abdominal pain 04/04/2018  . Vertigo 04/04/2018  . Well adult exam 07/02/2017  . Abdominal pain 07/02/2017  . Constipation 02/18/2016  . Small bowel obstruction ([place]) 11/06/2015  . Back pain 05/08/2015  . Cough 10/17/2014  . Dyslipidemia 02/09/2011  . CAD (coronary artery disease), native coronary artery 02/09/2011  . Afib ([place]) 01/20/2011  . CARCINOMA, SKIN, SQUAMOUS CELL 01/07/2010  . UNSPECIFIED HEARING LOSS 12/18/2008  . Diabetes mellitus, type 2 ([place]) 04/26/2007  . Essential hypertension 04/26/2007  . COLONIC POLYPS, HX OF 04/26/2007  . NEPHROLITHIASIS, HX OF 04/26/2007    Past Surgical History:  Procedure Laterality Date  . BACK SURGERY     disectomy  . COLONOSCOPY    . CORONARY ARTERY BYPASS GRAFT  01/12/2011  . LUMBAR LAMINECTOMY/DECOMPRESSION MICRODISCECTOMY N/A 05/08/2015  Procedure: LUMBAR LAMINECTOMY/DECOMPRESSION MICRODISCECTOMY 1 LEVEL Lumbar four five;  Surgeon: Melina Schools, MD;  Location: [facility];  Service: Orthopedics;  Laterality: N/A;  . TOTAL HIP ARTHROPLASTY Left    left on 04/15/07  . TYMPANIC MEMBRANE REPAIR  1981        Home Medications    Prior to Admission medications   Medication Sig Start Date End Date Taking? Authorizing Provider  amLODipine (NORVASC) 5 MG tablet TAKE 1 TABLET (5 MG TOTAL) BY MOUTH DAILY. Patient taking differently: TAKE 1 TABLET (5 MG TOTAL) BY MOUTH AT BEDTIME 02/14/18  Yes Fay Records, MD    aspirin 81 MG tablet Take 81 mg by mouth daily.    Yes [provider]  cholecalciferol (VITAMIN D) 1000 units tablet Take 1,000 Units by mouth 2 (two) times daily.   Yes [provider]  ibuprofen (ADVIL,MOTRIN) 200 MG tablet Take 400 mg by mouth every 6 (six) hours as needed for mild pain (pain).    Yes [provider]  metFORMIN (GLUCOPHAGE) 500 MG tablet Take 1 tablet (500 mg total) by mouth 2 (two) times daily with a meal. 01/05/18  Yes [name], Evie Lacks, MD  metoprolol tartrate (LOPRESSOR) 25 MG tablet TAKE 1 TABLET BY MOUTH DAILY. PLEASE KEEP UPCOMING APPT FOR FUTURE REFILLS. Parachute YOU 02/25/18  Yes Fay Records, MD  repaglinide (PRANDIN) 1 MG tablet TAKE 1 TABLET (1 MG TOTAL) BY MOUTH 3 (THREE) TIMES DAILY BEFORE MEALS. 04/07/18  Yes [name], Evie Lacks, MD  simvastatin (ZOCOR) 20 MG tablet TAKE 1/2 TABLET BY MOUTH EVERY DAY AT BEDTIME 04/06/18  Yes Fay Records, MD  ACCU-CHEK FASTCLIX LANCETS MISC Use to help check blood sugars twice a day 04/08/17   [name], Evie Lacks, MD  apixaban (ELIQUIS) 5 MG TABS tablet Take 1 tablet (5 mg total) by mouth 2 (two) times daily. 05/10/18   Tacy Learn, PA-C  Blood Glucose Monitoring Suppl (ACCU-CHEK AVIVA PLUS) w/Device KIT Use as directed 04/08/17   [name], Evie Lacks, MD  diltiazem (CARDIZEM LA) 240 MG 24 hr tablet Take 1 tablet (240 mg total) by mouth daily. 05/10/18 06/09/18  Suella Broad A, PA-C  glucose blood (ACCU-CHEK AVIVA PLUS) test strip 1 each by Other route 2 (two) times daily. Use to check blood sugars twice a day 04/08/17   [name], Evie Lacks, MD    Family History Family History  Problem Relation Age of Onset  . Heart failure Mother   . Heart disease Father   . Cancer Sister   . Cancer Brother   . Heart disease Brother   . Breast cancer Sister   . Colon cancer Neg Hx     Social History Social History   Tobacco Use  . Smoking status: Former Smoker    Last attempt to quit: 01/02/1981    Years  since quitting: 37.3  . Smokeless tobacco: Never Used  Substance Use Topics  . Alcohol use: No    Alcohol/week: 0.0 oz  . Drug use: No     Allergies   Patient has no known allergies.   Review of Systems Review of Systems  Constitutional: Negative for chills and fever.  HENT: Negative for congestion, postnasal drip, sinus pressure and sinus pain.   Respiratory: Positive for cough and shortness of breath. Negative for chest tightness and wheezing.   Cardiovascular: Positive for leg swelling. Negative for chest pain and palpitations.  Gastrointestinal: Negative for abdominal distention, abdominal pain, nausea and vomiting.  Genitourinary: Negative  for decreased urine volume.  Musculoskeletal: Negative for arthralgias and myalgias.  Skin: Negative for rash and wound.  Allergic/Immunologic: Positive for immunocompromised state.  Neurological: Negative for dizziness, weakness and light-headedness.  Hematological: Does not bruise/bleed easily.  Psychiatric/Behavioral: Negative for confusion.  All other systems reviewed and are negative.    Physical Exam Updated Vital Signs BP 139/70   Pulse 72   Temp 98.3 F (36.8 C) (Oral)   Resp 19   Ht '5\' 8"'  (1.727 m)   Wt 99.8 kg (220 lb)   SpO2 94%   BMI 33.45 kg/m   Physical Exam  Constitutional: He is oriented to person, place, and time. He appears well-developed and well-nourished.  Non-toxic appearance. He does not appear ill. No distress.  HENT:  Head: Normocephalic and atraumatic.  Cardiovascular: Intact distal pulses. An irregularly irregular rhythm present.  Pulmonary/Chest: Effort normal and breath sounds normal. Tachypnea noted. No respiratory distress. He exhibits no tenderness.  Abdominal: Soft. There is no tenderness.  Musculoskeletal:       Right lower leg: He exhibits edema.       Left lower leg: He exhibits edema.  Neurological: He is alert and oriented to person, place, and time.  Skin: Skin is warm and dry. No  rash noted.  Psychiatric: He has a normal mood and affect. His behavior is normal. His mood appears not anxious. He is not agitated.  Nursing note and vitals reviewed.    ED Treatments / Results  Labs (all labs ordered are listed, but only abnormal results are displayed) Labs Reviewed  BASIC METABOLIC PANEL - Abnormal; Notable for the following components:      Result Value   Glucose, Bld 113 (*)    All other components within normal limits  BRAIN NATRIURETIC PEPTIDE - Abnormal; Notable for the following components:   B Natriuretic Peptide 169.9 (*)    All other components within normal limits  MAGNESIUM - Abnormal; Notable for the following components:   Magnesium 2.5 (*)    All other components within normal limits  D-DIMER, QUANTITATIVE (NOT AT [facility]) - Abnormal; Notable for the following components:   D-Dimer, Quant 1.18 (*)    All other components within normal limits  CBC WITH DIFFERENTIAL/PLATELET  I-STAT TROPONIN, ED    EKG EKG Interpretation  Date/Time:  Tuesday May 10 2018 13:11:44 EDT Ventricular Rate:  127 PR Interval:    QRS Duration: 92 QT Interval:  338 QTC Calculation: 491 R Axis:   -17 Text Interpretation:  Atrial fibrillation with rapid ventricular response with premature ventricular or aberrantly conducted complexes Anteroseptal infarct , age undetermined Abnormal ECG Confirmed by Virgel Manifold [id number]) on 05/10/2018 3:19:43 PM   Radiology Ct Angio Chest Pe W/cm &/or Wo Cm  Result Date: 05/10/2018 CLINICAL DATA:  Short of breath.  Suspect pulmonary embolism EXAM: CT ANGIOGRAPHY CHEST WITH CONTRAST TECHNIQUE: Multidetector CT imaging of the chest was performed using the standard protocol during bolus administration of intravenous contrast. Multiplanar CT image reconstructions and MIPs were obtained to evaluate the vascular anatomy. CONTRAST:  159m ISOVUE-370 IOPAMIDOL (ISOVUE-370) INJECTION 76% COMPARISON:  Chest 05/10/2018 FINDINGS: Cardiovascular:   Negative for pulmonary embolism. Mild atherosclerotic disease in the thoracic aorta without aneurysm. No significant opacification of the thoracic aorta. CABG with extensive coronary calcification. Heart size mildly enlarged. No pericardial effusion. Mediastinum/Nodes: Negative for mass or adenopathy Lungs/Pleura: Small right pleural effusion with minimal right lower lobe atelectasis. Negative for infiltrate edema or mass. Upper Abdomen: Negative Musculoskeletal: No acute skeletal  abnormality. Review of the MIP images confirms the above findings. IMPRESSION: Negative for pulmonary embolism. Small right pleural effusion without infiltrate or edema. Extensive coronary calcification.  Postop CABG. Aortic Atherosclerosis (ICD10-I70.0). Electronically Signed   By: Franchot Gallo M.D.   On: 05/10/2018 17:19   Dg Chest Port 1 View  Result Date: 05/10/2018 CLINICAL DATA:  Shortness of breath. Recent history of productive cough. EXAM: PORTABLE CHEST 1 VIEW COMPARISON:  10/17/2014. FINDINGS: Prior CABG. Cardiomegaly. No pulmonary venous congestion. Low lung volumes with mild bibasilar atelectasis. A mild component bibasilar interstitial edema and/or infiltrates cannot be excluded. No pleural effusion or pneumothorax. No acute bony abnormality. IMPRESSION: 1.  Prior CABG.  Cardiomegaly.  No pulmonary venous congestion. 2. Low lung volumes with mild bibasilar atelectasis. A mild component of bibasilar interstitial edema and/or infiltrates cannot be excluded. Electronically Signed   By: Marcello Moores  Register   On: 05/10/2018 14:16    Procedures Procedures (including critical care time)  Medications Ordered in ED Medications  iopamidol (ISOVUE-370) 76 % injection (has no administration in time range)  diltiazem (CARDIZEM) injection 20 mg (20 mg Intravenous Given 05/10/18 1457)  furosemide (LASIX) injection 20 mg (20 mg Intravenous Given 05/10/18 1534)  iopamidol (ISOVUE-370) 76 % injection 100 mL (100 mLs Intravenous  Contrast Given 05/10/18 1638)     Initial Impression / Assessment and Plan / ED Course  I have reviewed the triage vital signs and the nursing notes.  Pertinent labs & imaging results that were available during my care of the patient were reviewed by me and considered in my medical decision making (see chart for details).  Clinical Course as of May 10 1726  Tue May 10, 2018  1646 83 yo male presents with complaint of [facility] for a "long time" but worse for the past 3 weeks. SHOB is worse with exertion, improves with rest. Patient notes his HR is normally in the 60s-70s but has been in the 100s the last few times he has checked his blood pressure (does not check his BP regularly). Patient is a non insulin dependent diabetic, prior double bipass in 2012 with afib after surgery but resolved and is not on blood thinners at this time. He reports leg swelling that is normal/unchanged for him. On exam, mild dyspnea noted as he had just transitioned form wheelchair to bed, improves with rest. HR irregularly irregular with an occasional PVC. Moderate pitting edema noted to bilateral lower extremities. DDX includes but not limited to afib, CHF, Pe. Case discussed with Dr. Wilson Singer, ER attending who has seen the patient, recommends cardizem 26m for rate control. Also lasix 265mfor edema. CXR with cardiomegaly. EKG afib RVR with PVC, rate 127.  Troponin is normal, CBC is normal, BNP is slightly elevated at 169.9, magnesium slightly elevated 2.5, d-dimer elevated at 1.18, BMP without significant changes.  CT chest ordered to evaluate for PE due to concerns for shortness of breath, O2 sats 93% while in the emergency room and unknown how long patient has been in A. fib not on anticoagulants.   [LM]  [id number] chest is negative for PE.  Patient has been referred to the A. fib clinic via ambulatory referral.  Patient be discharged home with prescription for Cardizem and Eliquis.  Return to ER for any worsening or concerning  symptoms.   [LM]    Clinical Course User Index [LM] MuTacy LearnPA-C    Final Clinical Impressions(s) / ED Diagnoses   Final diagnoses:  Atrial fibrillation with  RVR (HCC)  SOB (shortness of breath)  Lower extremity edema    ED Discharge Orders        Ordered    Amb Referral to AFIB Clinic     05/10/18 1726    apixaban (ELIQUIS) 5 MG TABS tablet  2 times daily     05/10/18 1726    diltiazem (CARDIZEM LA) 240 MG 24 hr tablet  Daily     05/10/18 1726       Tacy Learn, PA-C 05/10/18 1728    Virgel Manifold, MD 05/16/18 [phone]

## 2018-05-10 NOTE — ED Triage Notes (Signed)
Pt states that he has had SOB for years, reports that he mowed a few days ago and became more SOB, his PCP wanted him to come yesterday but he did not want to.

## 2018-05-10 NOTE — Discharge Instructions (Addendum)
Start taking Cardizem and Eliquis prescribed today and follow up with the A-fib clinic, a referral has been made. Return to the ER for any worsening or concerning symptoms.

## 2018-05-10 NOTE — ED Notes (Signed)
Pt going to ct scan at this time  

## 2018-05-11 NOTE — Telephone Encounter (Signed)
Pt went to ED

## 2018-05-12 ENCOUNTER — Telehealth: Payer: Self-pay | Admitting: Internal Medicine

## 2018-05-12 NOTE — Telephone Encounter (Signed)
New Message:       Pt is calling per hosp discharge notes to f/u with Dr. Beaulah Corin and DR have nothing soon for this pt's new afib onset.

## 2018-05-12 NOTE — Telephone Encounter (Signed)
Spoke with patient. Reports feeling better with HR slowed down, breathing better. Scheduled appointment for him in afib clinic for 05/16/18. Gave directions/instructions for parking. He asks about cost for going there. Advised to call [facility] with any questions about coverage.   Advised to call back if anything is needed sooner or if symptoms return.

## 2018-05-16 ENCOUNTER — Ambulatory Visit (HOSPITAL_COMMUNITY): Payer: Medicare Other | Admitting: Nurse Practitioner

## 2018-05-18 ENCOUNTER — Encounter (HOSPITAL_COMMUNITY): Payer: Self-pay | Admitting: Nurse Practitioner

## 2018-05-18 ENCOUNTER — Ambulatory Visit (HOSPITAL_COMMUNITY)
Admission: RE | Admit: 2018-05-18 | Discharge: 2018-05-18 | Disposition: A | Discharge status: Home / Self Care | Payer: Medicare Other | Source: Ambulatory Visit | Attending: Nurse Practitioner | Admitting: Nurse Practitioner

## 2018-05-18 VITALS — BP 114/76 | HR 94 | Ht 68.0 in | Wt 233.0 lb

## 2018-05-18 DIAGNOSIS — Z809 Family history of malignant neoplasm, unspecified: Secondary | ICD-10-CM | POA: Insufficient documentation

## 2018-05-18 DIAGNOSIS — I10 Essential (primary) hypertension: Secondary | ICD-10-CM | POA: Insufficient documentation

## 2018-05-18 DIAGNOSIS — Z803 Family history of malignant neoplasm of breast: Secondary | ICD-10-CM | POA: Diagnosis not present

## 2018-05-18 DIAGNOSIS — Z87891 Personal history of nicotine dependence: Secondary | ICD-10-CM | POA: Insufficient documentation

## 2018-05-18 DIAGNOSIS — I2510 Atherosclerotic heart disease of native coronary artery without angina pectoris: Secondary | ICD-10-CM | POA: Diagnosis not present

## 2018-05-18 DIAGNOSIS — Z7982 Long term (current) use of aspirin: Secondary | ICD-10-CM | POA: Diagnosis not present

## 2018-05-18 DIAGNOSIS — Z9889 Other specified postprocedural states: Secondary | ICD-10-CM | POA: Insufficient documentation

## 2018-05-18 DIAGNOSIS — Z951 Presence of aortocoronary bypass graft: Secondary | ICD-10-CM | POA: Diagnosis not present

## 2018-05-18 DIAGNOSIS — I481 Persistent atrial fibrillation: Secondary | ICD-10-CM | POA: Insufficient documentation

## 2018-05-18 DIAGNOSIS — E119 Type 2 diabetes mellitus without complications: Secondary | ICD-10-CM | POA: Diagnosis not present

## 2018-05-18 DIAGNOSIS — Z85828 Personal history of other malignant neoplasm of skin: Secondary | ICD-10-CM | POA: Diagnosis not present

## 2018-05-18 DIAGNOSIS — I4819 Other persistent atrial fibrillation: Secondary | ICD-10-CM

## 2018-05-18 DIAGNOSIS — M961 Postlaminectomy syndrome, not elsewhere classified: Secondary | ICD-10-CM | POA: Diagnosis not present

## 2018-05-18 DIAGNOSIS — Z79899 Other long term (current) drug therapy: Secondary | ICD-10-CM | POA: Diagnosis not present

## 2018-05-18 DIAGNOSIS — Z7901 Long term (current) use of anticoagulants: Secondary | ICD-10-CM | POA: Diagnosis not present

## 2018-05-18 DIAGNOSIS — Z96642 Presence of left artificial hip joint: Secondary | ICD-10-CM | POA: Insufficient documentation

## 2018-05-18 DIAGNOSIS — Z8249 Family history of ischemic heart disease and other diseases of the circulatory system: Secondary | ICD-10-CM | POA: Diagnosis not present

## 2018-05-18 DIAGNOSIS — E785 Hyperlipidemia, unspecified: Secondary | ICD-10-CM | POA: Diagnosis not present

## 2018-05-18 DIAGNOSIS — Z7984 Long term (current) use of oral hypoglycemic drugs: Secondary | ICD-10-CM | POA: Diagnosis not present

## 2018-05-18 NOTE — Progress Notes (Signed)
Primary Care Physician: Cassandria Anger, MD Referring Physician: [facility] ER Cardiologist: Dr. Jen Mow is a 83 y.o. male with a h/o  CAD, s/p CABG, DM,HTN, with chronic shortness of breath but presented to the ER with increase in shortness of breath for several weeks. He was found to be in afib. He has a h/o of afib that occurred at time of by pass in 2008, but has been quiet until now. He is now on eliquis since 6/26. No issues with anticoagulation so far. States that his weight is staying stable at home.   Today, he denies symptoms of palpitations, chest pain, shortness of breath, orthopnea, PND, lower extremity edema, dizziness, presyncope, syncope, or neurologic sequela. The patient is tolerating medications without difficulties and is otherwise without complaint today.   Past Medical History:  Diagnosis Date  . Arthritis   . Atrial fibrillation ([place])    post op; amiodarone and coumadin continued for 3 mos post op  . CARCINOMA, SKIN, SQUAMOUS CELL 01/07/2010  . Cataract    beginning of cataracts  . COLONIC POLYPS, HX OF 04/26/2007  . Coronary artery disease    s/p CABG 3/12: L-LAD, S-CFX (Dr. Roxy Manns); EF 45% at cath prior to CABG  . DIABETES MELLITUS, TYPE II 04/26/2007  . Hyperlipidemia   . HYPERTENSION 04/26/2007  . NEPHROLITHIASIS, HX OF 04/26/2007  . SBO (small bowel obstruction) ([place]) 10/2015   Partial   . Shortness of breath dyspnea    with exertion  . Unspecified hearing loss 12/18/2008   Past Surgical History:  Procedure Laterality Date  . BACK SURGERY     disectomy  . COLONOSCOPY    . CORONARY ARTERY BYPASS GRAFT  01/12/2011  . LUMBAR LAMINECTOMY/DECOMPRESSION MICRODISCECTOMY N/A 05/08/2015   Procedure: LUMBAR LAMINECTOMY/DECOMPRESSION MICRODISCECTOMY 1 LEVEL Lumbar four five;  Surgeon: Melina Schools, MD;  Location: [facility];  Service: Orthopedics;  Laterality: N/A;  . TOTAL HIP ARTHROPLASTY Left    left on 04/15/07  . TYMPANIC MEMBRANE REPAIR  1981     Current Outpatient Medications  Medication Sig Dispense Refill  . ACCU-CHEK FASTCLIX LANCETS MISC Use to help check blood sugars twice a day 306 each 1  . amLODipine (NORVASC) 5 MG tablet TAKE 1 TABLET (5 MG TOTAL) BY MOUTH DAILY. (Patient taking differently: TAKE 1 TABLET (5 MG TOTAL) BY MOUTH AT BEDTIME) 30 tablet 11  . apixaban (ELIQUIS) 5 MG TABS tablet Take 1 tablet (5 mg total) by mouth 2 (two) times daily. 60 tablet 0  . aspirin 81 MG tablet Take 81 mg by mouth daily.     . Blood Glucose Monitoring Suppl (ACCU-CHEK AVIVA PLUS) w/Device KIT Use as directed 1 kit 0  . cholecalciferol (VITAMIN D) 1000 units tablet Take 1,000 Units by mouth 2 (two) times daily.    Marland Kitchen diltiazem (CARDIZEM LA) 240 MG 24 hr tablet Take 1 tablet (240 mg total) by mouth daily. 30 tablet 0  . glucose blood (ACCU-CHEK AVIVA PLUS) test strip 1 each by Other route 2 (two) times daily. Use to check blood sugars twice a day 300 each 1  . ibuprofen (ADVIL,MOTRIN) 200 MG tablet Take 400 mg by mouth every 6 (six) hours as needed for mild pain (pain).     . metFORMIN (GLUCOPHAGE) 500 MG tablet Take 1 tablet (500 mg total) by mouth 2 (two) times daily with a meal. 270 tablet 2  . metoprolol tartrate (LOPRESSOR) 25 MG tablet TAKE 1 TABLET BY MOUTH DAILY. PLEASE  KEEP UPCOMING APPT FOR FUTURE REFILLS. THANK YOU 30 tablet 10  . repaglinide (PRANDIN) 1 MG tablet TAKE 1 TABLET (1 MG TOTAL) BY MOUTH 3 (THREE) TIMES DAILY BEFORE MEALS. 90 tablet 11  . simvastatin (ZOCOR) 20 MG tablet TAKE 1/2 TABLET BY MOUTH EVERY DAY AT BEDTIME (Patient not taking: Reported on 05/18/2018) 45 tablet 2   No current facility-administered medications for this encounter.     No Known Allergies  Social History   Socioeconomic History  . Marital status: Married    Spouse name: Not on file  . Number of children: Not on file  . Years of education: Not on file  . Highest education level: Not on file  Occupational History  . Not on file  Social  Needs  . Financial resource strain: Not on file  . Food insecurity:    Worry: Not on file    Inability: Not on file  . Transportation needs:    Medical: Not on file    Non-medical: Not on file  Tobacco Use  . Smoking status: Former Smoker    Last attempt to quit: 01/02/1981    Years since quitting: 37.3  . Smokeless tobacco: Never Used  Substance and Sexual Activity  . Alcohol use: No    Alcohol/week: 0.0 oz  . Drug use: No  . Sexual activity: Not on file  Lifestyle  . Physical activity:    Days per week: Not on file    Minutes per session: Not on file  . Stress: Not on file  Relationships  . Social connections:    Talks on phone: Not on file    Gets together: Not on file    Attends religious service: Not on file    Active member of club or organization: Not on file    Attends meetings of clubs or organizations: Not on file    Relationship status: Not on file  . Intimate partner violence:    Fear of current or ex partner: Not on file    Emotionally abused: Not on file    Physically abused: Not on file    Forced sexual activity: Not on file  Other Topics Concern  . Not on file  Social History Narrative  . Not on file    Family History  Problem Relation Age of Onset  . Heart failure Mother   . Heart disease Father   . Cancer Sister   . Cancer Brother   . Heart disease Brother   . Breast cancer Sister   . Colon cancer Neg Hx     ROS- All systems are reviewed and negative except as per the HPI above  Physical Exam: Vitals:   05/18/18 1331  BP: 114/76  Pulse: 94  Weight: 233 lb (105.7 kg)  Height: '5\' 8"'  (1.727 m)   Wt Readings from Last 3 Encounters:  05/18/18 233 lb (105.7 kg)  05/10/18 220 lb (99.8 kg)  04/04/18 237 lb (107.5 kg)    Labs: Lab Results  Component Value Date   NA 137 05/10/2018   K 4.6 05/10/2018   CL 103 05/10/2018   CO2 23 05/10/2018   GLUCOSE 113 (H) 05/10/2018   BUN 15 05/10/2018   CREATININE 1.09 05/10/2018   CALCIUM 9.7  05/10/2018   MG 2.5 (H) 05/10/2018   Lab Results  Component Value Date   INR 2.3 03/03/2012   Lab Results  Component Value Date   CHOL 101 02/07/2018   HDL 43 02/07/2018   LDLCALC  35 02/07/2018   TRIG 117 02/07/2018     GEN- The patient is well appearing, alert and oriented x 3 today.   Head- normocephalic, atraumatic Eyes-  Sclera clear, conjunctiva pink Ears- hearing intact Oropharynx- clear Neck- supple, no JVP Lymph- no cervical lymphadenopathy Lungs- Clear to ausculation bilaterally, normal work of breathing Heart- irregular rate and rhythm, no murmurs, rubs or gallops, PMI not laterally displaced GI- soft, NT, ND, + BS Extremities- no clubbing, cyanosis, or edema MS- no significant deformity or atrophy Skin- no rash or lesion Psych- euthymic mood, full affect Neuro- strength and sensation are intact  EKG- afib at 94 bpm, qrs int 86 ms, qtc 437 ms Epic records reviewed Records reviewed    Assessment and Plan: 1. Persistent  afib ? Duration  Has been feeling more short of breath for several weeks Now on eliquis 5 mg bid since 6/26 and will plan for cardioversion after uninterrupted cardioversion in 3 weeks Will continue ASA for now, will leave to Dr. Alan Ripper discretion if this can be d/c with eliquis now on board He is rate controlled and will continue CCB/BBB at current levels  2. HTN Stable  3. CAD Will have pt f/u with Dr. Harrington Challenger in 4-8 weeks and she can decide if f/u repeat echo/myoview is warranted  F/u here in 10 days and will get scheduled for cardioversion   Butch Penny C. Carroll, [facility] [address] [place], [place] [address] [phone]

## 2018-06-02 ENCOUNTER — Encounter (HOSPITAL_COMMUNITY): Payer: Self-pay | Admitting: Nurse Practitioner

## 2018-06-02 ENCOUNTER — Ambulatory Visit (HOSPITAL_COMMUNITY)
Admission: RE | Admit: 2018-06-02 | Discharge: 2018-06-02 | Disposition: A | Discharge status: Home / Self Care | Payer: Medicare Other | Source: Ambulatory Visit | Attending: Nurse Practitioner | Admitting: Nurse Practitioner

## 2018-06-02 ENCOUNTER — Other Ambulatory Visit: Payer: Self-pay

## 2018-06-02 VITALS — BP 148/88 | HR 88 | Ht 68.0 in | Wt 233.6 lb

## 2018-06-02 DIAGNOSIS — I481 Persistent atrial fibrillation: Secondary | ICD-10-CM | POA: Diagnosis not present

## 2018-06-02 DIAGNOSIS — Z7984 Long term (current) use of oral hypoglycemic drugs: Secondary | ICD-10-CM | POA: Diagnosis not present

## 2018-06-02 DIAGNOSIS — Z951 Presence of aortocoronary bypass graft: Secondary | ICD-10-CM | POA: Insufficient documentation

## 2018-06-02 DIAGNOSIS — E785 Hyperlipidemia, unspecified: Secondary | ICD-10-CM | POA: Insufficient documentation

## 2018-06-02 DIAGNOSIS — E119 Type 2 diabetes mellitus without complications: Secondary | ICD-10-CM | POA: Insufficient documentation

## 2018-06-02 DIAGNOSIS — Z7901 Long term (current) use of anticoagulants: Secondary | ICD-10-CM | POA: Diagnosis not present

## 2018-06-02 DIAGNOSIS — I2510 Atherosclerotic heart disease of native coronary artery without angina pectoris: Secondary | ICD-10-CM | POA: Diagnosis not present

## 2018-06-02 DIAGNOSIS — Z87891 Personal history of nicotine dependence: Secondary | ICD-10-CM | POA: Diagnosis not present

## 2018-06-02 DIAGNOSIS — Z79899 Other long term (current) drug therapy: Secondary | ICD-10-CM | POA: Insufficient documentation

## 2018-06-02 DIAGNOSIS — Z87442 Personal history of urinary calculi: Secondary | ICD-10-CM | POA: Diagnosis not present

## 2018-06-02 DIAGNOSIS — I4819 Other persistent atrial fibrillation: Secondary | ICD-10-CM

## 2018-06-02 DIAGNOSIS — I10 Essential (primary) hypertension: Secondary | ICD-10-CM | POA: Insufficient documentation

## 2018-06-02 DIAGNOSIS — R0602 Shortness of breath: Secondary | ICD-10-CM | POA: Insufficient documentation

## 2018-06-02 LAB — BASIC METABOLIC PANEL
Anion gap: 8 (ref 5–15)
BUN: 16 mg/dL (ref 8–23)
CALCIUM: 9.2 mg/dL (ref 8.9–10.3)
CO2: 25 mmol/L (ref 22–32)
CREATININE: 1.03 mg/dL (ref 0.61–1.24)
Chloride: 106 mmol/L (ref 98–111)
GFR calc Af Amer: >60 mL/min (ref >60)
Glucose, Bld: 124 mg/dL — ABNORMAL HIGH (ref 70–99)
Potassium: 4.1 mmol/L (ref 3.5–5.1)
SODIUM: 139 mmol/L (ref 135–145)

## 2018-06-02 LAB — CBC
HCT: 39.8 % (ref 39.0–52.0)
Hemoglobin: 13.5 g/dL (ref 13.0–17.0)
MCH: 31 pg (ref 26.0–34.0)
MCHC: 33.9 g/dL (ref 30.0–36.0)
MCV: 91.5 fL (ref 78.0–100.0)
PLATELETS: 216 10*3/uL (ref 150–400)
RBC: 4.35 MIL/uL (ref 4.22–5.81)
RDW: 12.1 % (ref 11.5–15.5)
WBC: 7.2 10*3/uL (ref 4.0–10.5)

## 2018-06-02 MED ORDER — APIXABAN 5 MG PO TABS: 5.0000 mg | ORAL_TABLET | Freq: Two times a day (BID) | ORAL | 3 refills | Status: DC

## 2018-06-02 NOTE — H&P (View-Only) (Signed)
Primary Care Physician: Cassandria Anger, MD Referring Physician: [facility] Cardiologist: Dr. Jen Mow is a 83 y.o. male with a h/o  CAD, s/p CABG, DM,HTN, with chronic shortness of breath but presented to the ER with increase in shortness of breath for several weeks. He was found to be in afib. He has a h/o of afib that occurred at time of by pass in 2008, but has been quiet until now. He is now on eliquis since 6/26. No issues with anticoagulation so far. States that his weight is staying stable at home.   F/u in afib clinic 7/18. He has been on uninterrupted anticoagulation since 6/26 and will now be scheduled for cardioversion.  He continues with shortness of breath with exertions. He is staying stable with his weight.  Today, he denies symptoms of palpitations, chest pain, shortness of breath, orthopnea, PND, lower extremity edema, dizziness, presyncope, syncope, or neurologic sequela. The patient is tolerating medications without difficulties and is otherwise without complaint today.   Past Medical History:  Diagnosis Date  . Arthritis   . Atrial fibrillation ([place])    post op; amiodarone and coumadin continued for 3 mos post op  . CARCINOMA, SKIN, SQUAMOUS CELL 01/07/2010  . Cataract    beginning of cataracts  . COLONIC POLYPS, HX OF 04/26/2007  . Coronary artery disease    s/p CABG 3/12: L-LAD, S-CFX (Dr. Roxy Manns); EF 45% at cath prior to CABG  . DIABETES MELLITUS, TYPE II 04/26/2007  . Hyperlipidemia   . HYPERTENSION 04/26/2007  . NEPHROLITHIASIS, HX OF 04/26/2007  . SBO (small bowel obstruction) ([place]) 10/2015   Partial   . Shortness of breath dyspnea    with exertion  . Unspecified hearing loss 12/18/2008   Past Surgical History:  Procedure Laterality Date  . BACK SURGERY     disectomy  . COLONOSCOPY    . CORONARY ARTERY BYPASS GRAFT  01/12/2011  . LUMBAR LAMINECTOMY/DECOMPRESSION MICRODISCECTOMY N/A 05/08/2015   Procedure: LUMBAR LAMINECTOMY/DECOMPRESSION  MICRODISCECTOMY 1 LEVEL Lumbar four five;  Surgeon: Melina Schools, MD;  Location: [facility];  Service: Orthopedics;  Laterality: N/A;  . TOTAL HIP ARTHROPLASTY Left    left on 04/15/07  . TYMPANIC MEMBRANE REPAIR  1981    Current Outpatient Medications  Medication Sig Dispense Refill  . ACCU-CHEK FASTCLIX LANCETS MISC Use to help check blood sugars twice a day 306 each 1  . amLODipine (NORVASC) 5 MG tablet TAKE 1 TABLET (5 MG TOTAL) BY MOUTH DAILY. (Patient taking differently: TAKE 1 TABLET (5 MG TOTAL) BY MOUTH AT BEDTIME) 30 tablet 11  . apixaban (ELIQUIS) 5 MG TABS tablet Take 1 tablet (5 mg total) by mouth 2 (two) times daily. 60 tablet 0  . aspirin 81 MG tablet Take 81 mg by mouth daily.     . Blood Glucose Monitoring Suppl (ACCU-CHEK AVIVA PLUS) w/Device KIT Use as directed 1 kit 0  . cholecalciferol (VITAMIN D) 1000 units tablet Take 1,000 Units by mouth 2 (two) times daily.    Marland Kitchen diltiazem (CARDIZEM LA) 240 MG 24 hr tablet Take 1 tablet (240 mg total) by mouth daily. 30 tablet 0  . glucose blood (ACCU-CHEK AVIVA PLUS) test strip 1 each by Other route 2 (two) times daily. Use to check blood sugars twice a day 300 each 1  . ibuprofen (ADVIL,MOTRIN) 200 MG tablet Take 400 mg by mouth every 6 (six) hours as needed for mild pain (pain).     . metFORMIN (  GLUCOPHAGE) 500 MG tablet Take 1 tablet (500 mg total) by mouth 2 (two) times daily with a meal. 270 tablet 2  . metoprolol tartrate (LOPRESSOR) 25 MG tablet TAKE 1 TABLET BY MOUTH DAILY. PLEASE KEEP UPCOMING APPT FOR FUTURE REFILLS. THANK YOU 30 tablet 10  . repaglinide (PRANDIN) 1 MG tablet TAKE 1 TABLET (1 MG TOTAL) BY MOUTH 3 (THREE) TIMES DAILY BEFORE MEALS. 90 tablet 11  . simvastatin (ZOCOR) 20 MG tablet TAKE 1/2 TABLET BY MOUTH EVERY DAY AT BEDTIME 45 tablet 2   No current facility-administered medications for this encounter.     No Known Allergies  Social History   Socioeconomic History  . Marital status: Married    Spouse name:  Not on file  . Number of children: Not on file  . Years of education: Not on file  . Highest education level: Not on file  Occupational History  . Not on file  Social Needs  . Financial resource strain: Not on file  . Food insecurity:    Worry: Not on file    Inability: Not on file  . Transportation needs:    Medical: Not on file    Non-medical: Not on file  Tobacco Use  . Smoking status: Former Smoker    Last attempt to quit: 01/02/1981    Years since quitting: 37.4  . Smokeless tobacco: Never Used  Substance and Sexual Activity  . Alcohol use: No    Alcohol/week: 0.0 oz  . Drug use: No  . Sexual activity: Not on file  Lifestyle  . Physical activity:    Days per week: Not on file    Minutes per session: Not on file  . Stress: Not on file  Relationships  . Social connections:    Talks on phone: Not on file    Gets together: Not on file    Attends religious service: Not on file    Active member of club or organization: Not on file    Attends meetings of clubs or organizations: Not on file    Relationship status: Not on file  . Intimate partner violence:    Fear of current or ex partner: Not on file    Emotionally abused: Not on file    Physically abused: Not on file    Forced sexual activity: Not on file  Other Topics Concern  . Not on file  Social History Narrative  . Not on file    Family History  Problem Relation Age of Onset  . Heart failure Mother   . Heart disease Father   . Cancer Sister   . Cancer Brother   . Heart disease Brother   . Breast cancer Sister   . Colon cancer Neg Hx     ROS- All systems are reviewed and negative except as per the HPI above  Physical Exam: Vitals:   06/02/18 1120  BP: (!) 148/88  Pulse: 88  SpO2: 97%  Weight: 233 lb 9.6 oz (106 kg)  Height: _0  (1.727 m)   Wt Readings from Last 3 Encounters:  06/02/18 233 lb 9.6 oz (106 kg)  05/18/18 233 lb (105.7 kg)  05/10/18 220 lb (99.8 kg)    Labs: Lab Results    Component Value Date   NA 137 05/10/2018   K 4.6 05/10/2018   CL 103 05/10/2018   CO2 23 05/10/2018   GLUCOSE 113 (H) 05/10/2018   BUN 15 05/10/2018   CREATININE 1.09 05/10/2018   CALCIUM 9.7 05/10/2018  MG 2.5 (H) 05/10/2018   Lab Results  Component Value Date   INR 2.3 03/03/2012   Lab Results  Component Value Date   CHOL 101 02/07/2018   HDL 43 02/07/2018   LDLCALC 35 02/07/2018   TRIG 117 02/07/2018     GEN- The patient is well appearing, alert and oriented x 3 today.   Head- normocephalic, atraumatic Eyes-  Sclera clear, conjunctiva pink Ears- hearing intact Oropharynx- clear Neck- supple, no JVP Lymph- no cervical lymphadenopathy Lungs- Clear to ausculation bilaterally, normal work of breathing Heart- irregular rate and rhythm, no murmurs, rubs or gallops, PMI not laterally displaced GI- soft, NT, ND, + BS Extremities- no clubbing, cyanosis, or edema MS- no significant deformity or atrophy Skin- no rash or lesion Psych- euthymic mood, full affect Neuro- strength and sensation are intact  EKG- afib at 86  bpm, qrs int 86 ms, qtc 437 ms Epic records reviewed Records reviewed    Assessment and Plan: 1. Persistent  afib ? Duration  Has been feeling more short of breath for several weeks Now on eliquis 5 mg bid since 6/26,uninterrupted, will plan for Cardioversion, reminded not to miss any doses  Risk vrs benefit of procedure  explained to pt and he wishes to procedure Will continue ASA for now, will leave to Dr. Alan Ripper discretion if this can be d/c with eliquis now on board He is rate controlled and will continue CCB/BBB at current levels  2. HTN Stable  3. CAD Will have pt f/u with Dr. Harrington Challenger in 4-8 weeks and she can decide if f/u repeat echo/myoview is warranted  F/u here in 7-10 days after cardioversion   Milton. Aulani Shipton, [facility] [address] [place], [place] [address] [phone]

## 2018-06-02 NOTE — Progress Notes (Signed)
Primary Care Physician: Cassandria Anger, MD Referring Physician: [facility] Cardiologist: Dr. Jen Mow is a 83 y.o. male with a h/o  CAD, s/p CABG, DM,HTN, with chronic shortness of breath but presented to the ER with increase in shortness of breath for several weeks. He was found to be in afib. He has a h/o of afib that occurred at time of by pass in 2008, but has been quiet until now. He is now on eliquis since 6/26. No issues with anticoagulation so far. States that his weight is staying stable at home.   F/u in afib clinic 7/18. He has been on uninterrupted anticoagulation since 6/26 and will now be scheduled for cardioversion.  He continues with shortness of breath with exertions. He is staying stable with his weight.  Today, he denies symptoms of palpitations, chest pain, shortness of breath, orthopnea, PND, lower extremity edema, dizziness, presyncope, syncope, or neurologic sequela. The patient is tolerating medications without difficulties and is otherwise without complaint today.   Past Medical History:  Diagnosis Date  . Arthritis   . Atrial fibrillation ([place])    post op; amiodarone and coumadin continued for 3 mos post op  . CARCINOMA, SKIN, SQUAMOUS CELL 01/07/2010  . Cataract    beginning of cataracts  . COLONIC POLYPS, HX OF 04/26/2007  . Coronary artery disease    s/p CABG 3/12: L-LAD, S-CFX (Dr. Roxy Manns); EF 45% at cath prior to CABG  . DIABETES MELLITUS, TYPE II 04/26/2007  . Hyperlipidemia   . HYPERTENSION 04/26/2007  . NEPHROLITHIASIS, HX OF 04/26/2007  . SBO (small bowel obstruction) ([place]) 10/2015   Partial   . Shortness of breath dyspnea    with exertion  . Unspecified hearing loss 12/18/2008   Past Surgical History:  Procedure Laterality Date  . BACK SURGERY     disectomy  . COLONOSCOPY    . CORONARY ARTERY BYPASS GRAFT  01/12/2011  . LUMBAR LAMINECTOMY/DECOMPRESSION MICRODISCECTOMY N/A 05/08/2015   Procedure: LUMBAR LAMINECTOMY/DECOMPRESSION  MICRODISCECTOMY 1 LEVEL Lumbar four five;  Surgeon: Melina Schools, MD;  Location: [facility];  Service: Orthopedics;  Laterality: N/A;  . TOTAL HIP ARTHROPLASTY Left    left on 04/15/07  . TYMPANIC MEMBRANE REPAIR  1981    Current Outpatient Medications  Medication Sig Dispense Refill  . ACCU-CHEK FASTCLIX LANCETS MISC Use to help check blood sugars twice a day 306 each 1  . amLODipine (NORVASC) 5 MG tablet TAKE 1 TABLET (5 MG TOTAL) BY MOUTH DAILY. (Patient taking differently: TAKE 1 TABLET (5 MG TOTAL) BY MOUTH AT BEDTIME) 30 tablet 11  . apixaban (ELIQUIS) 5 MG TABS tablet Take 1 tablet (5 mg total) by mouth 2 (two) times daily. 60 tablet 0  . aspirin 81 MG tablet Take 81 mg by mouth daily.     . Blood Glucose Monitoring Suppl (ACCU-CHEK AVIVA PLUS) w/Device KIT Use as directed 1 kit 0  . cholecalciferol (VITAMIN D) 1000 units tablet Take 1,000 Units by mouth 2 (two) times daily.    Marland Kitchen diltiazem (CARDIZEM LA) 240 MG 24 hr tablet Take 1 tablet (240 mg total) by mouth daily. 30 tablet 0  . glucose blood (ACCU-CHEK AVIVA PLUS) test strip 1 each by Other route 2 (two) times daily. Use to check blood sugars twice a day 300 each 1  . ibuprofen (ADVIL,MOTRIN) 200 MG tablet Take 400 mg by mouth every 6 (six) hours as needed for mild pain (pain).     . metFORMIN (  GLUCOPHAGE) 500 MG tablet Take 1 tablet (500 mg total) by mouth 2 (two) times daily with a meal. 270 tablet 2  . metoprolol tartrate (LOPRESSOR) 25 MG tablet TAKE 1 TABLET BY MOUTH DAILY. PLEASE KEEP UPCOMING APPT FOR FUTURE REFILLS. THANK YOU 30 tablet 10  . repaglinide (PRANDIN) 1 MG tablet TAKE 1 TABLET (1 MG TOTAL) BY MOUTH 3 (THREE) TIMES DAILY BEFORE MEALS. 90 tablet 11  . simvastatin (ZOCOR) 20 MG tablet TAKE 1/2 TABLET BY MOUTH EVERY DAY AT BEDTIME 45 tablet 2   No current facility-administered medications for this encounter.     No Known Allergies  Social History   Socioeconomic History  . Marital status: Married    Spouse name:  Not on file  . Number of children: Not on file  . Years of education: Not on file  . Highest education level: Not on file  Occupational History  . Not on file  Social Needs  . Financial resource strain: Not on file  . Food insecurity:    Worry: Not on file    Inability: Not on file  . Transportation needs:    Medical: Not on file    Non-medical: Not on file  Tobacco Use  . Smoking status: Former Smoker    Last attempt to quit: 01/02/1981    Years since quitting: 37.4  . Smokeless tobacco: Never Used  Substance and Sexual Activity  . Alcohol use: No    Alcohol/week: 0.0 oz  . Drug use: No  . Sexual activity: Not on file  Lifestyle  . Physical activity:    Days per week: Not on file    Minutes per session: Not on file  . Stress: Not on file  Relationships  . Social connections:    Talks on phone: Not on file    Gets together: Not on file    Attends religious service: Not on file    Active member of club or organization: Not on file    Attends meetings of clubs or organizations: Not on file    Relationship status: Not on file  . Intimate partner violence:    Fear of current or ex partner: Not on file    Emotionally abused: Not on file    Physically abused: Not on file    Forced sexual activity: Not on file  Other Topics Concern  . Not on file  Social History Narrative  . Not on file    Family History  Problem Relation Age of Onset  . Heart failure Mother   . Heart disease Father   . Cancer Sister   . Cancer Brother   . Heart disease Brother   . Breast cancer Sister   . Colon cancer Neg Hx     ROS- All systems are reviewed and negative except as per the HPI above  Physical Exam: Vitals:   06/02/18 1120  BP: (!) 148/88  Pulse: 88  SpO2: 97%  Weight: 233 lb 9.6 oz (106 kg)  Height: _0  (1.727 m)   Wt Readings from Last 3 Encounters:  06/02/18 233 lb 9.6 oz (106 kg)  05/18/18 233 lb (105.7 kg)  05/10/18 220 lb (99.8 kg)    Labs: Lab Results    Component Value Date   NA 137 05/10/2018   K 4.6 05/10/2018   CL 103 05/10/2018   CO2 23 05/10/2018   GLUCOSE 113 (H) 05/10/2018   BUN 15 05/10/2018   CREATININE 1.09 05/10/2018   CALCIUM 9.7 05/10/2018  MG 2.5 (H) 05/10/2018   Lab Results  Component Value Date   INR 2.3 03/03/2012   Lab Results  Component Value Date   CHOL 101 02/07/2018   HDL 43 02/07/2018   LDLCALC 35 02/07/2018   TRIG 117 02/07/2018     GEN- The patient is well appearing, alert and oriented x 3 today.   Head- normocephalic, atraumatic Eyes-  Sclera clear, conjunctiva pink Ears- hearing intact Oropharynx- clear Neck- supple, no JVP Lymph- no cervical lymphadenopathy Lungs- Clear to ausculation bilaterally, normal work of breathing Heart- irregular rate and rhythm, no murmurs, rubs or gallops, PMI not laterally displaced GI- soft, NT, ND, + BS Extremities- no clubbing, cyanosis, or edema MS- no significant deformity or atrophy Skin- no rash or lesion Psych- euthymic mood, full affect Neuro- strength and sensation are intact  EKG- afib at 86  bpm, qrs int 86 ms, qtc 437 ms Epic records reviewed Records reviewed    Assessment and Plan: 1. Persistent  afib ? Duration  Has been feeling more short of breath for several weeks Now on eliquis 5 mg bid since 6/26,uninterrupted, will plan for Cardioversion, reminded not to miss any doses  Risk vrs benefit of procedure  explained to pt and he wishes to procedure Will continue ASA for now, will leave to Dr. Alan Ripper discretion if this can be d/c with eliquis now on board He is rate controlled and will continue CCB/BBB at current levels  2. HTN Stable  3. CAD Will have pt f/u with Dr. Harrington Challenger in 4-8 weeks and she can decide if f/u repeat echo/myoview is warranted  F/u here in 7-10 days after cardioversion   [place]. [place], [facility] [address] [place], [place] [address] [phone]

## 2018-06-02 NOTE — Patient Instructions (Signed)
Cardioversion scheduled for Wednesday, July 24th  - Arrive at the [facility] and go to admitting at [facility] not eat or drink anything after midnight the night prior to your procedure.  - Take all your morning medication with a sip of water prior to arrival.  - You will not be able to drive home after your procedure.

## 2018-06-08 ENCOUNTER — Encounter (HOSPITAL_COMMUNITY)
Admission: RE | Disposition: A | Discharge status: Home / Self Care | Payer: Self-pay | Source: Ambulatory Visit | Attending: Cardiology

## 2018-06-08 ENCOUNTER — Encounter (HOSPITAL_COMMUNITY): Payer: Self-pay

## 2018-06-08 ENCOUNTER — Ambulatory Visit (HOSPITAL_COMMUNITY): Payer: Medicare Other | Admitting: Anesthesiology

## 2018-06-08 ENCOUNTER — Ambulatory Visit (HOSPITAL_COMMUNITY)
Admission: RE | Admit: 2018-06-08 | Discharge: 2018-06-08 | Disposition: A | Discharge status: Home / Self Care | Payer: Medicare Other | Source: Ambulatory Visit | Attending: Cardiology | Admitting: Cardiology

## 2018-06-08 ENCOUNTER — Other Ambulatory Visit (HOSPITAL_COMMUNITY): Payer: Self-pay | Admitting: *Deleted

## 2018-06-08 ENCOUNTER — Other Ambulatory Visit: Payer: Self-pay

## 2018-06-08 DIAGNOSIS — I10 Essential (primary) hypertension: Secondary | ICD-10-CM | POA: Diagnosis not present

## 2018-06-08 DIAGNOSIS — H269 Unspecified cataract: Secondary | ICD-10-CM | POA: Diagnosis not present

## 2018-06-08 DIAGNOSIS — Z951 Presence of aortocoronary bypass graft: Secondary | ICD-10-CM | POA: Diagnosis not present

## 2018-06-08 DIAGNOSIS — I4891 Unspecified atrial fibrillation: Secondary | ICD-10-CM | POA: Diagnosis not present

## 2018-06-08 DIAGNOSIS — Z803 Family history of malignant neoplasm of breast: Secondary | ICD-10-CM | POA: Diagnosis not present

## 2018-06-08 DIAGNOSIS — M1990 Unspecified osteoarthritis, unspecified site: Secondary | ICD-10-CM | POA: Insufficient documentation

## 2018-06-08 DIAGNOSIS — Z981 Arthrodesis status: Secondary | ICD-10-CM | POA: Insufficient documentation

## 2018-06-08 DIAGNOSIS — Z9889 Other specified postprocedural states: Secondary | ICD-10-CM | POA: Insufficient documentation

## 2018-06-08 DIAGNOSIS — Z96642 Presence of left artificial hip joint: Secondary | ICD-10-CM | POA: Insufficient documentation

## 2018-06-08 DIAGNOSIS — H9190 Unspecified hearing loss, unspecified ear: Secondary | ICD-10-CM | POA: Diagnosis not present

## 2018-06-08 DIAGNOSIS — Z85828 Personal history of other malignant neoplasm of skin: Secondary | ICD-10-CM | POA: Insufficient documentation

## 2018-06-08 DIAGNOSIS — E119 Type 2 diabetes mellitus without complications: Secondary | ICD-10-CM | POA: Insufficient documentation

## 2018-06-08 DIAGNOSIS — Z809 Family history of malignant neoplasm, unspecified: Secondary | ICD-10-CM | POA: Insufficient documentation

## 2018-06-08 DIAGNOSIS — E785 Hyperlipidemia, unspecified: Secondary | ICD-10-CM | POA: Diagnosis not present

## 2018-06-08 DIAGNOSIS — Z8249 Family history of ischemic heart disease and other diseases of the circulatory system: Secondary | ICD-10-CM | POA: Insufficient documentation

## 2018-06-08 DIAGNOSIS — Z79899 Other long term (current) drug therapy: Secondary | ICD-10-CM | POA: Diagnosis not present

## 2018-06-08 DIAGNOSIS — Z7984 Long term (current) use of oral hypoglycemic drugs: Secondary | ICD-10-CM | POA: Insufficient documentation

## 2018-06-08 DIAGNOSIS — Z7982 Long term (current) use of aspirin: Secondary | ICD-10-CM | POA: Insufficient documentation

## 2018-06-08 DIAGNOSIS — R0602 Shortness of breath: Secondary | ICD-10-CM | POA: Diagnosis not present

## 2018-06-08 DIAGNOSIS — Z87891 Personal history of nicotine dependence: Secondary | ICD-10-CM | POA: Diagnosis not present

## 2018-06-08 DIAGNOSIS — I2510 Atherosclerotic heart disease of native coronary artery without angina pectoris: Secondary | ICD-10-CM | POA: Diagnosis not present

## 2018-06-08 DIAGNOSIS — I481 Persistent atrial fibrillation: Secondary | ICD-10-CM | POA: Insufficient documentation

## 2018-06-08 DIAGNOSIS — I482 Chronic atrial fibrillation: Secondary | ICD-10-CM | POA: Diagnosis not present

## 2018-06-08 DIAGNOSIS — Z87442 Personal history of urinary calculi: Secondary | ICD-10-CM | POA: Insufficient documentation

## 2018-06-08 DIAGNOSIS — Z7901 Long term (current) use of anticoagulants: Secondary | ICD-10-CM | POA: Insufficient documentation

## 2018-06-08 HISTORY — PX: CARDIOVERSION: SHX1299

## 2018-06-08 LAB — GLUCOSE, CAPILLARY: Glucose-Capillary: 159 mg/dL — ABNORMAL HIGH (ref 70–99)

## 2018-06-08 SURGERY — CARDIOVERSION
Anesthesia: General

## 2018-06-08 MED ORDER — DILTIAZEM HCL ER COATED BEADS 240 MG PO TB24: 240.0000 mg | ORAL_TABLET | Freq: Every day | ORAL | 3 refills | Status: DC

## 2018-06-08 MED ORDER — SODIUM CHLORIDE 0.9 % IV SOLN
INTRAVENOUS | Status: DC
  Administered 2018-06-08: 10:00:00 via INTRAVENOUS

## 2018-06-08 MED ORDER — LIDOCAINE 2% (20 MG/ML) 5 ML SYRINGE
INTRAMUSCULAR | Status: DC | PRN
  Administered 2018-06-08: 60 mg via INTRAVENOUS

## 2018-06-08 MED ORDER — PROPOFOL 10 MG/ML IV BOLUS
INTRAVENOUS | Status: DC | PRN
  Administered 2018-06-08: 60 mg via INTRAVENOUS

## 2018-06-08 NOTE — CV Procedure (Signed)
Procedure:   DCCV  Indication:  Symptomatic atrial fibrillation  Procedure Note:  The patient signed informed consent.  He has had had therapeutic anticoagulation with apixaban greater than 3 weeks.  Anesthesia was administered by Dr. Ambrose Pancoast.  Adequate airway was maintained throughout and vital followed per protocol.  He was cardioverted x 1 with 120 J of biphasic synchronized energy.  He converted to NSR.  There were no apparent complications.  The patient had normal neuro status and respiratory status post procedure with vitals stable as recorded elsewhere.    Follow up:  We will arrange follow up with cardiology.  He will continue on current medical therapy.    Buford Dresser, MD PhD 06/08/2018 10:44 AM

## 2018-06-08 NOTE — Anesthesia Postprocedure Evaluation (Signed)
Anesthesia Post Note  Patient: [name]  Procedure(s) Performed: CARDIOVERSION (N/A )     Patient location during evaluation: PACU Anesthesia Type: General Level of consciousness: awake and alert Pain management: pain level controlled Vital Signs Assessment: post-procedure vital signs reviewed and stable Respiratory status: spontaneous breathing, nonlabored ventilation, respiratory function stable and patient connected to nasal cannula oxygen Cardiovascular status: blood pressure returned to baseline and stable Postop Assessment: no apparent nausea or vomiting Anesthetic complications: no    Last Vitals:  Vitals:   06/08/18 1045 06/08/18 1050  BP: (!) 111/58 125/61  Pulse: 70 65  Resp: (!) 25 (!) 21  Temp:    SpO2: 93% 96%    Last Pain:  Vitals:   06/08/18 1050  TempSrc:   PainSc: 0-No pain                 [name]

## 2018-06-08 NOTE — Interval H&P Note (Signed)
History and Physical Interval Note:  06/08/2018 9:27 AM  [name]  has presented today for surgery, with the diagnosis of AFIB  The various methods of treatment have been discussed with the patient and family. After consideration of risks, benefits and other options for treatment, the patient has consented to  Procedure(s): CARDIOVERSION (N/A) as a surgical intervention .  The patient's history has been reviewed, patient examined, no change in status, stable for surgery.  I have reviewed the patient's chart and labs.  Questions were answered to the patient's satisfaction.     Adalina Dopson Harrell Gave

## 2018-06-08 NOTE — Transfer of Care (Signed)
Immediate Anesthesia Transfer of Care Note  Patient: [name]  Procedure(s) Performed: CARDIOVERSION (N/A )  Patient Location: PACU  Anesthesia Type:General  Level of Consciousness: drowsy  Airway & Oxygen Therapy: Patient Spontanous Breathing and Patient connected to face mask oxygen  Post-op Assessment: Report given to RN and Post -op Vital signs reviewed and stable  Post vital signs: Reviewed and stable  Last Vitals:  Vitals Value Taken Time  BP    Temp    Pulse    Resp    SpO2      Last Pain:  Vitals:   06/08/18 0921  TempSrc: Oral         Complications: No apparent anesthesia complications

## 2018-06-08 NOTE — Discharge Instructions (Signed)
Electrical Cardioversion, Care After °This sheet gives you information about how to care for yourself after your procedure. Your health care provider may also give you more specific instructions. If you have problems or questions, contact your health care provider. °What can I expect after the procedure? °After the procedure, it is common to have: °· Some redness on the skin where the shocks were given. ° °Follow these instructions at home: °· Do not drive for 24 hours if you were given a medicine to help you relax (sedative). °· Take over-the-counter and prescription medicines only as told by your health care provider. °· Ask your health care provider how to check your pulse. Check it often. °· Rest for 48 hours after the procedure or as told by your health care provider. °· Avoid or limit your caffeine use as told by your health care provider. °Contact a health care provider if: °· You feel like your heart is beating too quickly or your pulse is not regular. °· You have a serious muscle cramp that does not go away. °Get help right away if: °· You have discomfort in your chest. °· You are dizzy or you feel faint. °· You have trouble breathing or you are short of breath. °· Your speech is slurred. °· You have trouble moving an arm or leg on one side of your body. °· Your fingers or toes turn cold or blue. °This information is not intended to replace advice given to you by your health care provider. Make sure you discuss any questions you have with your health care provider. °Document Released: 08/23/2013 Document Revised: 06/05/2016 Document Reviewed: 05/08/2016 °Elsevier Interactive Patient Education © 2018 [employer] ° °

## 2018-06-08 NOTE — Anesthesia Preprocedure Evaluation (Addendum)
Anesthesia Evaluation  Patient identified by MRN, date of birth, ID band Patient awake    Reviewed: Allergy & Precautions, NPO status , Patient's Chart, lab work & pertinent test results, reviewed documented beta blocker date and time   Airway Mallampati: II  TM Distance: >3 FB Neck ROM: Full    Dental no notable dental hx. (+) Teeth Intact, Poor Dentition   Pulmonary shortness of breath, former smoker,    Pulmonary exam normal breath sounds clear to auscultation       Cardiovascular hypertension, Pt. on medications and Pt. on home beta blockers + CAD  Normal cardiovascular exam Rhythm:Regular Rate:Normal  Echo 5/17  Left ventricle: The cavity size was normal. Wall thickness was   normal. Systolic function was normal. The estimated ejection   fraction was in the range of 55% to 60%. Wall motion was normal;   there were no regional wall motion abnormalities. Doppler   parameters are consistent with abnormal left ventricular   relaxation (grade 1 diastolic dysfunction). The E/e&' ratio is   between 8-15, suggesting indeterminate LV filling pressure. - Mitral valve: Mildly thickened leaflets . There was trivial   regurgitation. - Left atrium: Moderately dilated at 39 ml/m2. - Right ventricle: The cavity size was mildly dilated. Systolic   function is reduced. - Right atrium: The atrium was mildly dilated. - Tricuspid valve: There was mild regurgitation. - Pulmonary arteries: PA peak pressure: 25 mm Hg (S).     Neuro/Psych negative neurological ROS  negative psych ROS   GI/Hepatic negative GI ROS, Neg liver ROS,   Endo/Other  diabetes, Type 2, Oral Hypoglycemic Agents  Renal/GU negative Renal ROS     Musculoskeletal  (+) Arthritis ,   Abdominal   Peds  Hematology negative hematology ROS (+)   Anesthesia Other Findings   Reproductive/Obstetrics negative OB ROS                           Anesthesia Physical  Anesthesia Plan  ASA: III  Anesthesia Plan: General   Post-op Pain Management:    Induction: Intravenous  PONV Risk Score and Plan: 1  Airway Management Planned: Nasal Cannula, Natural Airway and Mask  Additional Equipment:   Intra-op Plan:   Post-operative Plan: Extubation in OR  Informed Consent: I have reviewed the patients History and Physical, chart, labs and discussed the procedure including the risks, benefits and alternatives for the proposed anesthesia with the patient or authorized representative who has indicated his/her understanding and acceptance.   Dental advisory given  Plan Discussed with: CRNA, Anesthesiologist and Surgeon  Anesthesia Plan Comments:         Anesthesia Quick Evaluation

## 2018-06-09 ENCOUNTER — Encounter (HOSPITAL_COMMUNITY): Payer: Self-pay | Admitting: Cardiology

## 2018-06-16 ENCOUNTER — Encounter (HOSPITAL_COMMUNITY): Payer: Self-pay | Admitting: Nurse Practitioner

## 2018-06-16 ENCOUNTER — Ambulatory Visit (HOSPITAL_COMMUNITY)
Admission: RE | Admit: 2018-06-16 | Discharge: 2018-06-16 | Disposition: A | Discharge status: Home / Self Care | Payer: Medicare Other | Source: Ambulatory Visit | Attending: Nurse Practitioner | Admitting: Nurse Practitioner

## 2018-06-16 VITALS — BP 122/66 | HR 82 | Ht 68.0 in | Wt 234.0 lb

## 2018-06-16 DIAGNOSIS — Z7901 Long term (current) use of anticoagulants: Secondary | ICD-10-CM | POA: Diagnosis not present

## 2018-06-16 DIAGNOSIS — I2510 Atherosclerotic heart disease of native coronary artery without angina pectoris: Secondary | ICD-10-CM | POA: Diagnosis not present

## 2018-06-16 DIAGNOSIS — Z7984 Long term (current) use of oral hypoglycemic drugs: Secondary | ICD-10-CM | POA: Insufficient documentation

## 2018-06-16 DIAGNOSIS — Z7982 Long term (current) use of aspirin: Secondary | ICD-10-CM | POA: Insufficient documentation

## 2018-06-16 DIAGNOSIS — Z791 Long term (current) use of non-steroidal anti-inflammatories (NSAID): Secondary | ICD-10-CM | POA: Insufficient documentation

## 2018-06-16 DIAGNOSIS — Z87442 Personal history of urinary calculi: Secondary | ICD-10-CM | POA: Insufficient documentation

## 2018-06-16 DIAGNOSIS — I4819 Other persistent atrial fibrillation: Secondary | ICD-10-CM

## 2018-06-16 DIAGNOSIS — Z803 Family history of malignant neoplasm of breast: Secondary | ICD-10-CM | POA: Insufficient documentation

## 2018-06-16 DIAGNOSIS — M1990 Unspecified osteoarthritis, unspecified site: Secondary | ICD-10-CM | POA: Diagnosis not present

## 2018-06-16 DIAGNOSIS — H9190 Unspecified hearing loss, unspecified ear: Secondary | ICD-10-CM | POA: Insufficient documentation

## 2018-06-16 DIAGNOSIS — Z8249 Family history of ischemic heart disease and other diseases of the circulatory system: Secondary | ICD-10-CM | POA: Diagnosis not present

## 2018-06-16 DIAGNOSIS — Z79899 Other long term (current) drug therapy: Secondary | ICD-10-CM | POA: Insufficient documentation

## 2018-06-16 DIAGNOSIS — Z87891 Personal history of nicotine dependence: Secondary | ICD-10-CM | POA: Insufficient documentation

## 2018-06-16 DIAGNOSIS — I481 Persistent atrial fibrillation: Secondary | ICD-10-CM | POA: Diagnosis not present

## 2018-06-16 DIAGNOSIS — Z96642 Presence of left artificial hip joint: Secondary | ICD-10-CM | POA: Diagnosis not present

## 2018-06-16 DIAGNOSIS — E785 Hyperlipidemia, unspecified: Secondary | ICD-10-CM | POA: Insufficient documentation

## 2018-06-16 DIAGNOSIS — Z951 Presence of aortocoronary bypass graft: Secondary | ICD-10-CM | POA: Diagnosis not present

## 2018-06-16 DIAGNOSIS — I10 Essential (primary) hypertension: Secondary | ICD-10-CM | POA: Diagnosis not present

## 2018-06-16 DIAGNOSIS — H269 Unspecified cataract: Secondary | ICD-10-CM | POA: Insufficient documentation

## 2018-06-16 DIAGNOSIS — E119 Type 2 diabetes mellitus without complications: Secondary | ICD-10-CM | POA: Diagnosis not present

## 2018-06-16 DIAGNOSIS — K635 Polyp of colon: Secondary | ICD-10-CM | POA: Diagnosis not present

## 2018-06-16 DIAGNOSIS — Z85828 Personal history of other malignant neoplasm of skin: Secondary | ICD-10-CM | POA: Insufficient documentation

## 2018-06-16 MED ORDER — AMIODARONE HCL 200 MG PO TABS: ORAL_TABLET | ORAL | 1 refills | Status: DC

## 2018-06-16 NOTE — Progress Notes (Signed)
Primary Care Physician: Cassandria Anger, MD Referring Physician: [facility] Cardiologist: Dr. Jen Mow is a 83 y.o. male with a h/o  CAD, s/p CABG, DM,HTN, with chronic shortness of breath but presented to the ER with increase in shortness of breath for several weeks. He was found to be in afib. He has a h/o of afib that occurred at time of by pass in 2008, but has been quiet until now. He is now on eliquis since 6/26. No issues with anticoagulation so far. States that his weight is staying stable at home.   F/u in afib clinic 7/18. He has been on uninterrupted anticoagulation since 6/26 and will now be scheduled for cardioversion.  He continues with shortness of breath with exertions. He is staying stable with his weight.  S/p cardioversion 7/24 which was successful but unfortunately, he had returned to [place]. He did feel improved in SR and would like to pursue antiarrythmic therapy to restore SR since  SR with cardioversion was short ilived.  Today, he denies symptoms of palpitations, chest pain,orthopnea, PND, lower extremity edema, dizziness, presyncope, syncope, or neurologic sequela. + for  shortness of breath and fatigue with return to afib.The patient is tolerating medications without difficulties and is otherwise without complaint today.   Past Medical History:  Diagnosis Date  . Arthritis   . Atrial fibrillation ([place])    post op; amiodarone and coumadin continued for 3 mos post op  . CARCINOMA, SKIN, SQUAMOUS CELL 01/07/2010  . Cataract    beginning of cataracts  . COLONIC POLYPS, HX OF 04/26/2007  . Coronary artery disease    s/p CABG 3/12: L-LAD, S-CFX (Dr. Roxy Manns); EF 45% at cath prior to CABG  . DIABETES MELLITUS, TYPE II 04/26/2007  . Hyperlipidemia   . HYPERTENSION 04/26/2007  . NEPHROLITHIASIS, HX OF 04/26/2007  . SBO (small bowel obstruction) ([place]) 10/2015   Partial   . Shortness of breath dyspnea    with exertion  . Unspecified hearing loss 12/18/2008    Past Surgical History:  Procedure Laterality Date  . BACK SURGERY     disectomy  . CARDIOVERSION N/A 06/08/2018   Procedure: CARDIOVERSION;  Surgeon: Buford Dresser, MD;  Location: [facility] ENDOSCOPY;  Service: Cardiovascular;  Laterality: N/A;  . COLONOSCOPY    . CORONARY ARTERY BYPASS GRAFT  01/12/2011  . LUMBAR LAMINECTOMY/DECOMPRESSION MICRODISCECTOMY N/A 05/08/2015   Procedure: LUMBAR LAMINECTOMY/DECOMPRESSION MICRODISCECTOMY 1 LEVEL Lumbar four five;  Surgeon: Melina Schools, MD;  Location: [place];  Service: Orthopedics;  Laterality: N/A;  . TOTAL HIP ARTHROPLASTY Left    left on 04/15/07  . TYMPANIC MEMBRANE REPAIR  1981    Current Outpatient Medications  Medication Sig Dispense Refill  . ACCU-CHEK FASTCLIX LANCETS MISC Use to help check blood sugars twice a day 306 each 1  . amLODipine (NORVASC) 5 MG tablet TAKE 1 TABLET (5 MG TOTAL) BY MOUTH DAILY. (Patient taking differently: TAKE 1 TABLET (5 MG TOTAL) BY MOUTH AT BEDTIME) 30 tablet 11  . apixaban (ELIQUIS) 5 MG TABS tablet Take 1 tablet (5 mg total) by mouth 2 (two) times daily. 60 tablet 3  . aspirin 81 MG tablet Take 81 mg by mouth daily.     . Blood Glucose Monitoring Suppl (ACCU-CHEK AVIVA PLUS) w/Device KIT Use as directed 1 kit 0  . cholecalciferol (VITAMIN D) 1000 units tablet Take 1,000 Units by mouth 2 (two) times daily.    Marland Kitchen diltiazem (CARDIZEM LA) 240 MG 24 hr tablet  Take 1 tablet (240 mg total) by mouth daily. 30 tablet 3  . glucose blood (ACCU-CHEK AVIVA PLUS) test strip 1 each by Other route 2 (two) times daily. Use to check blood sugars twice a day 300 each 1  . ibuprofen (ADVIL,MOTRIN) 200 MG tablet Take 400 mg by mouth every 6 (six) hours as needed for mild pain (pain).     . metFORMIN (GLUCOPHAGE) 500 MG tablet Take 1 tablet (500 mg total) by mouth 2 (two) times daily with a meal. 270 tablet 2  . repaglinide (PRANDIN) 1 MG tablet TAKE 1 TABLET (1 MG TOTAL) BY MOUTH 3 (THREE) TIMES DAILY BEFORE MEALS. 90 tablet  11  . simvastatin (ZOCOR) 20 MG tablet TAKE 1/2 TABLET BY MOUTH EVERY DAY AT BEDTIME 45 tablet 2  . amiodarone (PACERONE) 200 MG tablet Take 1 tablet by mouth twice a day for 1 month then reduce to 1 tablet a day 60 tablet 1   No current facility-administered medications for this encounter.     No Known Allergies  Social History   Socioeconomic History  . Marital status: Married    Spouse name: Not on file  . Number of children: Not on file  . Years of education: Not on file  . Highest education level: Not on file  Occupational History  . Not on file  Social Needs  . Financial resource strain: Not on file  . Food insecurity:    Worry: Not on file    Inability: Not on file  . Transportation needs:    Medical: Not on file    Non-medical: Not on file  Tobacco Use  . Smoking status: Former Smoker    Last attempt to quit: 01/02/1981    Years since quitting: 37.4  . Smokeless tobacco: Never Used  Substance and Sexual Activity  . Alcohol use: No    Alcohol/week: 0.0 oz  . Drug use: No  . Sexual activity: Not on file  Lifestyle  . Physical activity:    Days per week: Not on file    Minutes per session: Not on file  . Stress: Not on file  Relationships  . Social connections:    Talks on phone: Not on file    Gets together: Not on file    Attends religious service: Not on file    Active member of club or organization: Not on file    Attends meetings of clubs or organizations: Not on file    Relationship status: Not on file  . Intimate partner violence:    Fear of current or ex partner: Not on file    Emotionally abused: Not on file    Physically abused: Not on file    Forced sexual activity: Not on file  Other Topics Concern  . Not on file  Social History Narrative  . Not on file    Family History  Problem Relation Age of Onset  . Heart failure Mother   . Heart disease Father   . Cancer Sister   . Cancer Brother   . Heart disease Brother   . Breast cancer  Sister   . Colon cancer Neg Hx     ROS- All systems are reviewed and negative except as per the HPI above  Physical Exam: Vitals:   06/16/18 1345  BP: 122/66  Pulse: 82  SpO2: 96%  Weight: 234 lb (106.1 kg)  Height: '5\' 8"'  (1.727 m)   Wt Readings from Last 3 Encounters:  06/16/18 234 lb (  106.1 kg)  06/08/18 233 lb (105.7 kg)  06/02/18 233 lb 9.6 oz (106 kg)    Labs: Lab Results  Component Value Date   NA 139 06/02/2018   K 4.1 06/02/2018   CL 106 06/02/2018   CO2 25 06/02/2018   GLUCOSE 124 (H) 06/02/2018   BUN 16 06/02/2018   CREATININE 1.03 06/02/2018   CALCIUM 9.2 06/02/2018   MG 2.5 (H) 05/10/2018   Lab Results  Component Value Date   INR 2.3 03/03/2012   Lab Results  Component Value Date   CHOL 101 02/07/2018   HDL 43 02/07/2018   LDLCALC 35 02/07/2018   TRIG 117 02/07/2018     GEN- The patient is well appearing, alert and oriented x 3 today.   Head- normocephalic, atraumatic Eyes-  Sclera clear, conjunctiva pink Ears- hearing intact Oropharynx- clear Neck- supple, no JVP Lymph- no cervical lymphadenopathy Lungs- Clear to ausculation bilaterally, normal work of breathing Heart- irregular rate and rhythm, no murmurs, rubs or gallops, PMI not laterally displaced GI- soft, NT, ND, + BS Extremities- no clubbing, cyanosis, or edema MS- no significant deformity or atrophy Skin- no rash or lesion Psych- euthymic mood, full affect Neuro- strength and sensation are intact  EKG- afib at 82  bpm, qrs int 86 ms, qtc 437 ms Epic records reviewed Records reviewed    Assessment and Plan: 1. Persistent  afib Successful cardioversion but with ERAF Has been feeling more short of breath for several weeks Continue  eliquis 5 mg bid   Antiarrythmic's discussed with pt , not a flecainide candidate as he has CAD, he is clear that with a chronically ill wife at home that he can not afford time from home for hospitalization for Tikosyn or sotalol I discussed in  detail risk vrs benefit of amiodarone and he would like to proceed I will start amiodarone 200 mg bid, baseline tsh/cmet in epic, PFT scheduled Continue CCB, stop metoprolol  2. HTN Stable  3. CAD Will have pt f/u with Dr. Harrington Challenger in 4-8 weeks and she can decide if f/u repeat echo/myoview is warranted  F/u here  In one week on amiodarone for EKG/tolerance   Kale Dols C. Marrion Finan, [facility] [address] [place], [place] [address] [phone]

## 2018-06-16 NOTE — Patient Instructions (Addendum)
Stop metoprolol   Start Amiodarone 200mg  twice a day with food  Wednesday - come to afib clinic at 1:30 for EKG we will take you to respiratory to follow. No caffeine 4 hours prior to the test.

## 2018-06-21 ENCOUNTER — Other Ambulatory Visit: Payer: Self-pay

## 2018-06-21 ENCOUNTER — Encounter (HOSPITAL_COMMUNITY): Payer: Self-pay

## 2018-06-21 ENCOUNTER — Emergency Department (HOSPITAL_COMMUNITY): Payer: Medicare Other

## 2018-06-21 ENCOUNTER — Emergency Department (HOSPITAL_COMMUNITY)
Admission: EM | Admit: 2018-06-21 | Discharge: 2018-06-21 | Disposition: A | Discharge status: Home / Self Care | Payer: Medicare Other | Attending: Emergency Medicine | Admitting: Emergency Medicine

## 2018-06-21 DIAGNOSIS — Z79899 Other long term (current) drug therapy: Secondary | ICD-10-CM | POA: Insufficient documentation

## 2018-06-21 DIAGNOSIS — Z7901 Long term (current) use of anticoagulants: Secondary | ICD-10-CM | POA: Insufficient documentation

## 2018-06-21 DIAGNOSIS — R1110 Vomiting, unspecified: Secondary | ICD-10-CM | POA: Insufficient documentation

## 2018-06-21 DIAGNOSIS — Z7982 Long term (current) use of aspirin: Secondary | ICD-10-CM | POA: Diagnosis not present

## 2018-06-21 DIAGNOSIS — Z7984 Long term (current) use of oral hypoglycemic drugs: Secondary | ICD-10-CM | POA: Diagnosis not present

## 2018-06-21 DIAGNOSIS — R1084 Generalized abdominal pain: Secondary | ICD-10-CM | POA: Diagnosis not present

## 2018-06-21 DIAGNOSIS — R112 Nausea with vomiting, unspecified: Secondary | ICD-10-CM | POA: Diagnosis not present

## 2018-06-21 DIAGNOSIS — E119 Type 2 diabetes mellitus without complications: Secondary | ICD-10-CM | POA: Diagnosis not present

## 2018-06-21 DIAGNOSIS — R109 Unspecified abdominal pain: Secondary | ICD-10-CM | POA: Diagnosis not present

## 2018-06-21 DIAGNOSIS — I259 Chronic ischemic heart disease, unspecified: Secondary | ICD-10-CM | POA: Diagnosis not present

## 2018-06-21 DIAGNOSIS — I10 Essential (primary) hypertension: Secondary | ICD-10-CM | POA: Insufficient documentation

## 2018-06-21 DIAGNOSIS — Z87891 Personal history of nicotine dependence: Secondary | ICD-10-CM | POA: Insufficient documentation

## 2018-06-21 LAB — URINALYSIS, ROUTINE W REFLEX MICROSCOPIC
Bilirubin Urine: NEGATIVE
Glucose, UA: 150 mg/dL — AB
Ketones, ur: 5 mg/dL — AB
Leukocytes, UA: NEGATIVE
Nitrite: NEGATIVE
Protein, ur: NEGATIVE mg/dL
Specific Gravity, Urine: >1.046 — ABNORMAL HIGH (ref 1.005–1.030)
pH: 7 (ref 5.0–8.0)

## 2018-06-21 LAB — COMPREHENSIVE METABOLIC PANEL
ALT: 25 U/L (ref 0–44)
AST: 26 U/L (ref 15–41)
Albumin: 4 g/dL (ref 3.5–5.0)
Alkaline Phosphatase: 44 U/L (ref 38–126)
Anion gap: 11 (ref 5–15)
BUN: 21 mg/dL (ref 8–23)
CO2: 23 mmol/L (ref 22–32)
Calcium: 9.2 mg/dL (ref 8.9–10.3)
Chloride: 103 mmol/L (ref 98–111)
Creatinine, Ser: 1 mg/dL (ref 0.61–1.24)
GFR calc Af Amer: >60 mL/min (ref >60)
GFR calc non Af Amer: >60 mL/min (ref >60)
Glucose, Bld: 233 mg/dL — ABNORMAL HIGH (ref 70–99)
Potassium: 3.9 mmol/L (ref 3.5–5.1)
Sodium: 137 mmol/L (ref 135–145)
Total Bilirubin: 0.6 mg/dL (ref 0.3–1.2)
Total Protein: 7.4 g/dL (ref 6.5–8.1)

## 2018-06-21 LAB — CBC
HCT: 41.9 % (ref 39.0–52.0)
Hemoglobin: 14.3 g/dL (ref 13.0–17.0)
MCH: 30.9 pg (ref 26.0–34.0)
MCHC: 34.1 g/dL (ref 30.0–36.0)
MCV: 90.5 fL (ref 78.0–100.0)
Platelets: 259 10*3/uL (ref 150–400)
RBC: 4.63 MIL/uL (ref 4.22–5.81)
RDW: 12.4 % (ref 11.5–15.5)
WBC: 10.3 10*3/uL (ref 4.0–10.5)

## 2018-06-21 LAB — LIPASE, BLOOD: Lipase: 38 U/L (ref 11–51)

## 2018-06-21 MED ORDER — IOPAMIDOL (ISOVUE-300) INJECTION 61%
100.0000 mL | Freq: Once | INTRAVENOUS | Status: AC | PRN
  Administered 2018-06-21: 100 mL via INTRAVENOUS

## 2018-06-21 MED ORDER — SODIUM CHLORIDE 0.9 % IV BOLUS: 1000.0000 mL | Freq: Once | INTRAVENOUS | Status: DC

## 2018-06-21 MED ORDER — IOPAMIDOL (ISOVUE-300) INJECTION 61%
INTRAVENOUS | Status: AC
  Administered 2018-06-21: 09:00:00
  Filled 2018-06-21: qty 100

## 2018-06-21 NOTE — ED Triage Notes (Signed)
PT presents to ED from home for ABD pain and emesis. Pt reports that he developed the pain around 7 pm, but it got worse overnight. Pt has previous hx of "blockage."

## 2018-06-21 NOTE — Discharge Instructions (Addendum)
Return here as needed.  Your testing and CT scan did not show any significant abnormality at this time.  Follow-up with your primary doctor.  If your symptoms worsen return here.

## 2018-06-21 NOTE — ED Notes (Signed)
No urine noted at this time 

## 2018-06-21 NOTE — ED Provider Notes (Signed)
[facility] DEPT Provider Note   CSN: [id number] Arrival date & time: 06/21/18  7672     History   Chief Complaint Chief Complaint  Patient presents with  . Abdominal Pain  . Emesis    HPI [name] is a 83 y.o. male.  HPI Patient presents to the emergency department with abdominal discomfort that started yesterday evening.  The patient states that his pain got worse overnight and he started having some episodes of vomiting and nausea.  The patient states that he has had a history of bowel obstruction that is what he is concerned about mostly.  Patient states he has not had any vomiting since being at the emergency department and feels that his nausea has improved following medications.  Patient states that he did not take any medications prior to arrival for symptoms.  The patient denies chest pain, shortness of breath, headache,blurred vision, neck pain, fever, cough, weakness, numbness, dizziness, anorexia, edema,rash, back pain, dysuria, hematemesis, bloody stool, near syncope, or syncope. Past Medical History:  Diagnosis Date  . Arthritis   . Atrial fibrillation ([place])    post op; amiodarone and coumadin continued for 3 mos post op  . CARCINOMA, SKIN, SQUAMOUS CELL 01/07/2010  . Cataract    beginning of cataracts  . COLONIC POLYPS, HX OF 04/26/2007  . Coronary artery disease    s/p CABG 3/12: L-LAD, S-CFX (Dr. Roxy Manns); EF 45% at cath prior to CABG  . DIABETES MELLITUS, TYPE II 04/26/2007  . Hyperlipidemia   . HYPERTENSION 04/26/2007  . NEPHROLITHIASIS, HX OF 04/26/2007  . SBO (small bowel obstruction) ([place]) 10/2015   Partial   . Shortness of breath dyspnea    with exertion  . Unspecified hearing loss 12/18/2008    Patient Active Problem List   Diagnosis Date Noted  . RUQ abdominal pain 04/04/2018  . Vertigo 04/04/2018  . Well adult exam 07/02/2017  . Abdominal pain 07/02/2017  . Constipation 02/18/2016  . Small bowel obstruction  ([place]) 11/06/2015  . Back pain 05/08/2015  . Cough 10/17/2014  . Dyslipidemia 02/09/2011  . CAD (coronary artery disease), native coronary artery 02/09/2011  . Afib ([place]) 01/20/2011  . CARCINOMA, SKIN, SQUAMOUS CELL 01/07/2010  . UNSPECIFIED HEARING LOSS 12/18/2008  . Diabetes mellitus, type 2 ([place]) 04/26/2007  . Essential hypertension 04/26/2007  . COLONIC POLYPS, HX OF 04/26/2007  . NEPHROLITHIASIS, HX OF 04/26/2007    Past Surgical History:  Procedure Laterality Date  . BACK SURGERY     disectomy  . CARDIOVERSION N/A 06/08/2018   Procedure: CARDIOVERSION;  Surgeon: Buford Dresser, MD;  Location: [facility] ENDOSCOPY;  Service: Cardiovascular;  Laterality: N/A;  . COLONOSCOPY    . CORONARY ARTERY BYPASS GRAFT  01/12/2011  . LUMBAR LAMINECTOMY/DECOMPRESSION MICRODISCECTOMY N/A 05/08/2015   Procedure: LUMBAR LAMINECTOMY/DECOMPRESSION MICRODISCECTOMY 1 LEVEL Lumbar four five;  Surgeon: Melina Schools, MD;  Location: [facility];  Service: Orthopedics;  Laterality: N/A;  . TOTAL HIP ARTHROPLASTY Left    left on 04/15/07  . TYMPANIC MEMBRANE REPAIR  1981        Home Medications    Prior to Admission medications   Medication Sig Start Date End Date Taking? Authorizing Provider  ACCU-CHEK FASTCLIX LANCETS MISC Use to help check blood sugars twice a day 04/08/17   [name], Evie Lacks, MD  amiodarone (PACERONE) 200 MG tablet Take 1 tablet by mouth twice a day for 1 month then reduce to 1 tablet a day 06/16/18   Sherran Needs, NP  amLODipine (NORVASC) 5 MG tablet TAKE 1 TABLET (5 MG TOTAL) BY MOUTH DAILY. Patient taking differently: TAKE 1 TABLET (5 MG TOTAL) BY MOUTH AT BEDTIME 02/14/18   Fay Records, MD  apixaban (ELIQUIS) 5 MG TABS tablet Take 1 tablet (5 mg total) by mouth 2 (two) times daily. 06/02/18   Sherran Needs, NP  aspirin 81 MG tablet Take 81 mg by mouth daily.     [provider]  Blood Glucose Monitoring Suppl (ACCU-CHEK AVIVA PLUS) w/Device KIT Use as directed  04/08/17   [name], Evie Lacks, MD  cholecalciferol (VITAMIN D) 1000 units tablet Take 1,000 Units by mouth 2 (two) times daily.    [provider]  diltiazem (CARDIZEM LA) 240 MG 24 hr tablet Take 1 tablet (240 mg total) by mouth daily. 06/08/18 07/08/18  Sherran Needs, NP  glucose blood (ACCU-CHEK AVIVA PLUS) test strip 1 each by Other route 2 (two) times daily. Use to check blood sugars twice a day 04/08/17   [name], Evie Lacks, MD  ibuprofen (ADVIL,MOTRIN) 200 MG tablet Take 400 mg by mouth every 6 (six) hours as needed for mild pain (pain).     [provider]  metFORMIN (GLUCOPHAGE) 500 MG tablet Take 1 tablet (500 mg total) by mouth 2 (two) times daily with a meal. 01/05/18   [name], Evie Lacks, MD  repaglinide (PRANDIN) 1 MG tablet TAKE 1 TABLET (1 MG TOTAL) BY MOUTH 3 (THREE) TIMES DAILY BEFORE MEALS. 04/07/18   [name], Evie Lacks, MD  simvastatin (ZOCOR) 20 MG tablet TAKE 1/2 TABLET BY MOUTH EVERY DAY AT BEDTIME 04/06/18   Fay Records, MD    Family History Family History  Problem Relation Age of Onset  . Heart failure Mother   . Heart disease Father   . Cancer Sister   . Cancer Brother   . Heart disease Brother   . Breast cancer Sister   . Colon cancer Neg Hx     Social History Social History   Tobacco Use  . Smoking status: Former Smoker    Last attempt to quit: 01/02/1981    Years since quitting: 37.4  . Smokeless tobacco: Never Used  Substance Use Topics  . Alcohol use: No    Alcohol/week: 0.0 oz  . Drug use: No     Allergies   Patient has no known allergies.   Review of Systems Review of Systems All other systems negative except as documented in the HPI. All pertinent positives and negatives as reviewed in the HPI.   Physical Exam Updated Vital Signs BP (!) 141/80 (BP Location: Left Arm)   Pulse 79   Temp 97.7 F (36.5 C) (Oral)   Resp 16   Ht _0  (1.727 m)   Wt 106.6 kg (235 lb)   SpO2 96%   BMI 35.73 kg/m   Physical  Exam  Constitutional: He is oriented to person, place, and time. He appears well-developed and well-nourished. No distress.  HENT:  Head: Normocephalic and atraumatic.  Mouth/Throat: Oropharynx is clear and moist.  Eyes: Pupils are equal, round, and reactive to light.  Neck: Normal range of motion. Neck supple.  Cardiovascular: Normal rate, regular rhythm and normal heart sounds. Exam reveals no gallop and no friction rub.  No murmur heard. Pulmonary/Chest: Effort normal and breath sounds normal. No respiratory distress. He has no wheezes.  Abdominal: Soft. Bowel sounds are normal. He exhibits no distension. There is tenderness in the epigastric area and periumbilical area. There  is no rigidity, no rebound and no guarding.  Neurological: He is alert and oriented to person, place, and time. He exhibits normal muscle tone. Coordination normal.  Skin: Skin is warm and dry. Capillary refill takes less than 2 seconds. No rash noted. No erythema.  Psychiatric: He has a normal mood and affect. His behavior is normal.  Nursing note and vitals reviewed.    ED Treatments / Results  Labs (all labs ordered are listed, but only abnormal results are displayed) Labs Reviewed  COMPREHENSIVE METABOLIC PANEL - Abnormal; Notable for the following components:      Result Value   Glucose, Bld 233 (*)    All other components within normal limits  URINALYSIS, ROUTINE W REFLEX MICROSCOPIC - Abnormal; Notable for the following components:   Specific Gravity, Urine >1.046 (*)    Glucose, UA 150 (*)    Hgb urine dipstick SMALL (*)    Ketones, ur 5 (*)    Bacteria, UA RARE (*)    All other components within normal limits  LIPASE, BLOOD  CBC    EKG None  Radiology Ct Abdomen Pelvis W Contrast  Result Date: 06/21/2018 CLINICAL DATA:  Midline abdominal pain and emesis. EXAM: CT ABDOMEN AND PELVIS WITH CONTRAST TECHNIQUE: Multidetector CT imaging of the abdomen and pelvis was performed using the standard  protocol following bolus administration of intravenous contrast. CONTRAST:  137m ISOVUE-300 IOPAMIDOL (ISOVUE-300) INJECTION 61% COMPARISON:  CT abdomen pelvis dated January 21, 2018. FINDINGS: Lower chest: No acute abnormality. Hepatobiliary: No focal liver abnormality is seen. No gallstones, gallbladder wall thickening, or biliary dilatation. Pancreas: Unremarkable. No pancreatic ductal dilatation or surrounding inflammatory changes. Spleen: Normal in size without focal abnormality. Adrenals/Urinary Tract: The adrenal glands are unremarkable. Subcentimeter low-density lesion in the left kidney is too small to characterize. No renal or ureteral calculi. No hydronephrosis. The bladder is unremarkable. Stomach/Bowel: Stomach is within normal limits. Appendix appears normal. No evidence of bowel wall thickening, distention, or inflammatory changes. Vascular/Lymphatic: Aortic atherosclerosis. No enlarged abdominal or pelvic lymph nodes. Reproductive: Prostate is unremarkable. Other: Unchanged small fat containing umbilical and left inguinal hernias. No free fluid or pneumoperitoneum. Musculoskeletal: No acute or significant osseous findings. Moderate degenerative changes of the lumbar spine, similar to prior study IMPRESSION: 1.  No acute intra-abdominal process. 2.  Aortic atherosclerosis (ICD10-I70.0). Electronically Signed   By: WTitus DubinM.D.   On: 06/21/2018 09:03    Procedures Procedures (including critical care time)  Medications Ordered in ED Medications  iopamidol (ISOVUE-300) 61 % injection (  Contrast Given 06/21/18 0911)  iopamidol (ISOVUE-300) 61 % injection 100 mL (100 mLs Intravenous Contrast Given 06/21/18 0837)     Initial Impression / Assessment and Plan / ED Course  I have reviewed the triage vital signs and the nursing notes.  Pertinent labs & imaging results that were available during my care of the patient were reviewed by me and considered in my medical decision making (see chart  for details).   Return here as needed. The patient is feeling no symptoms at this time. The patient has most likely a gastroenteritis type picture based off of his laboratory testing and CT scan findings.  The patient's vital signs of remained stable.  Have advised him to return for any worsening in his condition.  I did advise that this could be an evolving process that is yet to fully declare itself and to return for any worsening in his condition.  Patient agrees the plan and all questions  were answered.    Final Clinical Impressions(s) / ED Diagnoses   Final diagnoses:  None    ED Discharge Orders    None       Dalia Heading, Hershal Coria 06/26/18 1555    Virgel Manifold, MD 07/06/18 [phone]

## 2018-06-21 NOTE — ED Notes (Signed)
Pt given urinal and made aware of need for specimen  

## 2018-06-22 ENCOUNTER — Ambulatory Visit (HOSPITAL_COMMUNITY)
Admission: RE | Admit: 2018-06-22 | Discharge: 2018-06-22 | Disposition: A | Discharge status: Home / Self Care | Payer: Medicare Other | Source: Ambulatory Visit | Attending: Nurse Practitioner | Admitting: Nurse Practitioner

## 2018-06-22 DIAGNOSIS — R918 Other nonspecific abnormal finding of lung field: Secondary | ICD-10-CM | POA: Diagnosis not present

## 2018-06-22 DIAGNOSIS — I4819 Other persistent atrial fibrillation: Secondary | ICD-10-CM

## 2018-06-22 DIAGNOSIS — J449 Chronic obstructive pulmonary disease, unspecified: Secondary | ICD-10-CM | POA: Insufficient documentation

## 2018-06-22 DIAGNOSIS — I481 Persistent atrial fibrillation: Secondary | ICD-10-CM | POA: Insufficient documentation

## 2018-06-22 DIAGNOSIS — R9431 Abnormal electrocardiogram [ECG] [EKG]: Secondary | ICD-10-CM | POA: Insufficient documentation

## 2018-06-22 DIAGNOSIS — Z87891 Personal history of nicotine dependence: Secondary | ICD-10-CM | POA: Diagnosis not present

## 2018-06-22 LAB — PULMONARY FUNCTION TEST
DL/VA % pred: 75 %
DL/VA: 3.37 ml/min/mmHg/L
DLCO COR % PRED: 52 %
DLCO UNC % PRED: 52 %
DLCO UNC: 15.45 ml/min/mmHg
DLCO cor: 15.58 ml/min/mmHg
FEF 25-75 POST: 2.33 L/s
FEF 25-75 PRE: 2.01 L/s
FEF2575-%Change-Post: 15 %
FEF2575-%PRED-POST: 135 %
FEF2575-%Pred-Pre: 117 %
FEV1-%CHANGE-POST: 3 %
FEV1-%PRED-POST: 92 %
FEV1-%Pred-Pre: 88 %
FEV1-Post: 2.36 L
FEV1-Pre: 2.27 L
FEV1FVC-%Change-Post: 7 %
FEV1FVC-%PRED-PRE: 109 %
FEV6-%CHANGE-POST: -1 %
FEV6-%PRED-POST: 83 %
FEV6-%Pred-Pre: 85 %
FEV6-Post: 2.83 L
FEV6-Pre: 2.88 L
FEV6FVC-%CHANGE-POST: 1 %
FEV6FVC-%Pred-Post: 107 %
FEV6FVC-%Pred-Pre: 105 %
FVC-%Change-Post: -3 %
FVC-%Pred-Post: 77 %
FVC-%Pred-Pre: 80 %
FVC-Post: 2.83 L
FVC-Pre: 2.94 L
POST FEV1/FVC RATIO: 83 %
Post FEV6/FVC ratio: 100 %
Pre FEV1/FVC ratio: 77 %
Pre FEV6/FVC Ratio: 98 %
RV % pred: 93 %
RV: 2.41 L
TLC % pred: 80 %
TLC: 5.37 L

## 2018-06-22 MED ORDER — ALBUTEROL SULFATE (2.5 MG/3ML) 0.083% IN NEBU
2.5000 mg | INHALATION_SOLUTION | Freq: Once | RESPIRATORY_TRACT | Status: AC
  Administered 2018-06-22: 2.5 mg via RESPIRATORY_TRACT

## 2018-06-22 NOTE — Progress Notes (Addendum)
Pt in for EKG.  To be reviewed by Roderic Palau, NP  Pt started on amiodarone load 200 mg bid one week ago. He remains in afib with v rate of 82 bpm, qtc at 434 ms. He is pending PFT's today and I will see back in 2 weeks to get ready for cardioversion.

## 2018-06-24 NOTE — ED Provider Notes (Signed)
Medical screening examination/treatment/procedure(s) were conducted as a shared visit with non-physician practitioner(s) and myself.  I personally evaluated the patient during the encounter.  EKG Interpretation  Date/Time:  Tuesday June 21 2018 07:02:52 EDT Ventricular Rate:  81 PR Interval:    QRS Duration: 90 QT Interval:  412 QTC Calculation: 479 R Axis:   -17 Text Interpretation:  Atrial fibrillation Borderline left axis deviation Anterior infarct, old Nonspecific T abnormalities, lateral leads No significant change from 06/16/2018 Confirmed by Veryl Speak [phone]) on 06/23/2018 7:25:16 AM  83 year old male with abdominal pain.  Onset around 7-8 PM yesterday.  Worse overnight but is actually since starting to improve since being here in the emergency room almost the point of resolution.  He does have a past history of bowel obstruction and is very concerned about having a recurrence.  Vomiting yesterday.  Currently nausea is much improved as well.  CT without evidence of obstruction or other acute pathology.  I doubt emergent process.     Virgel Manifold, MD 06/24/18 1150

## 2018-07-05 ENCOUNTER — Other Ambulatory Visit (INDEPENDENT_AMBULATORY_CARE_PROVIDER_SITE_OTHER): Payer: Medicare Other

## 2018-07-05 ENCOUNTER — Encounter: Payer: Self-pay | Admitting: Internal Medicine

## 2018-07-05 ENCOUNTER — Ambulatory Visit (INDEPENDENT_AMBULATORY_CARE_PROVIDER_SITE_OTHER): Payer: Medicare Other | Admitting: Internal Medicine

## 2018-07-05 DIAGNOSIS — I10 Essential (primary) hypertension: Secondary | ICD-10-CM | POA: Diagnosis not present

## 2018-07-05 DIAGNOSIS — I2510 Atherosclerotic heart disease of native coronary artery without angina pectoris: Secondary | ICD-10-CM | POA: Diagnosis not present

## 2018-07-05 DIAGNOSIS — E1159 Type 2 diabetes mellitus with other circulatory complications: Secondary | ICD-10-CM

## 2018-07-05 DIAGNOSIS — E1142 Type 2 diabetes mellitus with diabetic polyneuropathy: Secondary | ICD-10-CM | POA: Diagnosis not present

## 2018-07-05 DIAGNOSIS — I480 Paroxysmal atrial fibrillation: Secondary | ICD-10-CM

## 2018-07-05 DIAGNOSIS — E1140 Type 2 diabetes mellitus with diabetic neuropathy, unspecified: Secondary | ICD-10-CM | POA: Insufficient documentation

## 2018-07-05 LAB — BASIC METABOLIC PANEL
BUN: 17 mg/dL (ref 6–23)
CO2: 26 mEq/L (ref 19–32)
CREATININE: 1.16 mg/dL (ref 0.40–1.50)
Calcium: 9.7 mg/dL (ref 8.4–10.5)
Chloride: 104 mEq/L (ref 96–112)
GFR: 64.15 mL/min (ref >60.00)
Glucose, Bld: 107 mg/dL — ABNORMAL HIGH (ref 70–99)
Potassium: 4.1 mEq/L (ref 3.5–5.1)
Sodium: 137 mEq/L (ref 135–145)

## 2018-07-05 LAB — HEMOGLOBIN A1C: HEMOGLOBIN A1C: 6.9 % — AB (ref 4.6–6.5)

## 2018-07-05 MED ORDER — REPAGLINIDE 2 MG PO TABS: 2.0000 mg | ORAL_TABLET | Freq: Three times a day (TID) | ORAL | 11 refills | Status: DC

## 2018-07-05 MED ORDER — GABAPENTIN 100 MG PO CAPS: 100.0000 mg | ORAL_CAPSULE | Freq: Three times a day (TID) | ORAL | 5 refills | Status: DC

## 2018-07-05 NOTE — Assessment & Plan Note (Signed)
On Metformin, Prandin BMET, A1c q 3 mo 

## 2018-07-05 NOTE — Progress Notes (Signed)
Subjective:  Patient ID: [name], male    DOB: 07-Jun-1935  Age: 83 y.o. MRN: [id number]  CC: No chief complaint on file.   HPI [name] presents for HTN, DM, CAD f/u  Outpatient Medications Prior to Visit  Medication Sig Dispense Refill  . ACCU-CHEK FASTCLIX LANCETS MISC Use to help check blood sugars twice a day 306 each 1  . amiodarone (PACERONE) 200 MG tablet Take 1 tablet by mouth twice a day for 1 month then reduce to 1 tablet a day 60 tablet 1  . amLODipine (NORVASC) 5 MG tablet TAKE 1 TABLET (5 MG TOTAL) BY MOUTH DAILY. (Patient taking differently: TAKE 1 TABLET (5 MG TOTAL) BY MOUTH at bedtime) 30 tablet 11  . apixaban (ELIQUIS) 5 MG TABS tablet Take 1 tablet (5 mg total) by mouth 2 (two) times daily. 60 tablet 3  . aspirin 81 MG tablet Take 81 mg by mouth daily.     . Blood Glucose Monitoring Suppl (ACCU-CHEK AVIVA PLUS) w/Device KIT Use as directed 1 kit 0  . cholecalciferol (VITAMIN D) 1000 units tablet Take 1,000 Units by mouth 2 (two) times daily.    Marland Kitchen diltiazem (CARDIZEM LA) 240 MG 24 hr tablet Take 1 tablet (240 mg total) by mouth daily. 30 tablet 3  . glucose blood (ACCU-CHEK AVIVA PLUS) test strip 1 each by Other route 2 (two) times daily. Use to check blood sugars twice a day 300 each 1  . ibuprofen (ADVIL,MOTRIN) 200 MG tablet Take 400 mg by mouth every 6 (six) hours as needed for mild pain (pain).     . metFORMIN (GLUCOPHAGE) 500 MG tablet Take 1 tablet (500 mg total) by mouth 2 (two) times daily with a meal. 270 tablet 2  . repaglinide (PRANDIN) 1 MG tablet TAKE 1 TABLET (1 MG TOTAL) BY MOUTH 3 (THREE) TIMES DAILY BEFORE MEALS. 90 tablet 11  . simvastatin (ZOCOR) 20 MG tablet TAKE 1/2 TABLET BY MOUTH EVERY DAY AT BEDTIME 45 tablet 2   No facility-administered medications prior to visit.     ROS: Review of Systems  Constitutional: Negative for appetite change, fatigue and unexpected weight change.  HENT: Negative for congestion, nosebleeds,  sneezing, sore throat and trouble swallowing.   Eyes: Negative for itching and visual disturbance.  Respiratory: Negative for cough.   Cardiovascular: Negative for chest pain, palpitations and leg swelling.  Gastrointestinal: Negative for abdominal distention, blood in stool, diarrhea and nausea.  Genitourinary: Negative for frequency and hematuria.  Musculoskeletal: Positive for arthralgias, back pain and gait problem. Negative for joint swelling and neck pain.  Skin: Negative for rash.  Neurological: Positive for numbness. Negative for dizziness, tremors, speech difficulty and weakness.  Psychiatric/Behavioral: Negative for agitation, dysphoric mood and sleep disturbance. The patient is not nervous/anxious.     Objective:  BP 130/78 (BP Location: Left Arm, Patient Position: Sitting, Cuff Size: Normal)   Pulse 67   Temp 97.6 F (36.4 C) (Oral)   Ht '5\' 8"'  (1.727 m)   Wt 236 lb (107 kg)   SpO2 97%   BMI 35.88 kg/m   BP Readings from Last 3 Encounters:  07/05/18 130/78  06/22/18 126/62  06/21/18 137/73    Wt Readings from Last 3 Encounters:  07/05/18 236 lb (107 kg)  06/22/18 233 lb (105.7 kg)  06/21/18 235 lb (106.6 kg)    Physical Exam  Constitutional: He is oriented to person, place, and time. He appears well-developed. No distress.  NAD  HENT:  Mouth/Throat: Oropharynx is clear and moist.  Eyes: Pupils are equal, round, and reactive to light. Conjunctivae are normal.  Neck: Normal range of motion. No JVD present. No thyromegaly present.  Cardiovascular: Normal rate, regular rhythm, normal heart sounds and intact distal pulses. Exam reveals no gallop and no friction rub.  No murmur heard. Pulmonary/Chest: Effort normal and breath sounds normal. No respiratory distress. He has no wheezes. He has no rales. He exhibits no tenderness.  Abdominal: Soft. Bowel sounds are normal. He exhibits no distension and no mass. There is no tenderness. There is no rebound and no guarding.   Musculoskeletal: Normal range of motion. He exhibits edema. He exhibits no tenderness.  Lymphadenopathy:    He has no cervical adenopathy.  Neurological: He is alert and oriented to person, place, and time. He has normal reflexes. No cranial nerve deficit. He exhibits normal muscle tone. He displays a negative Romberg sign. Coordination and gait normal.  Skin: Skin is warm and dry. No rash noted.  Psychiatric: He has a normal mood and affect. His behavior is normal. Judgment and thought content normal.  trace to 1+ edema B  Lab Results  Component Value Date   WBC 10.3 06/21/2018   HGB 14.3 06/21/2018   HCT 41.9 06/21/2018   PLT 259 06/21/2018   GLUCOSE 233 (H) 06/21/2018   CHOL 101 02/07/2018   TRIG 117 02/07/2018   HDL 43 02/07/2018   LDLCALC 35 02/07/2018   ALT 25 06/21/2018   AST 26 06/21/2018   NA 137 06/21/2018   K 3.9 06/21/2018   CL 103 06/21/2018   CREATININE 1.00 06/21/2018   BUN 21 06/21/2018   CO2 23 06/21/2018   TSH 2.05 07/02/2017   PSA 1.18 07/02/2017   INR 2.3 03/03/2012   HGBA1C 6.6 (H) 04/04/2018   MICROALBUR 0.7 04/04/2018    No results found.  Assessment & Plan:   There are no diagnoses linked to this encounter.   No orders of the defined types were placed in this encounter.    Follow-up: No follow-ups on file.  Walker Kehr, MD

## 2018-07-05 NOTE — Assessment & Plan Note (Addendum)
Will try Gabapentin 100 mg bid Treat DM, edema

## 2018-07-05 NOTE — Assessment & Plan Note (Signed)
Lopressor, ASA, Eliquis No CP

## 2018-07-05 NOTE — Assessment & Plan Note (Signed)
Lopressor, ASA, Amiodarone, Eliquis

## 2018-07-05 NOTE — Assessment & Plan Note (Signed)
On Metoprolol  and Norvasc

## 2018-07-06 ENCOUNTER — Encounter (HOSPITAL_COMMUNITY): Payer: Self-pay | Admitting: Nurse Practitioner

## 2018-07-06 ENCOUNTER — Ambulatory Visit (HOSPITAL_COMMUNITY)
Admission: RE | Admit: 2018-07-06 | Discharge: 2018-07-06 | Disposition: A | Discharge status: Home / Self Care | Payer: Medicare Other | Source: Ambulatory Visit | Attending: Nurse Practitioner | Admitting: Nurse Practitioner

## 2018-07-06 ENCOUNTER — Other Ambulatory Visit (HOSPITAL_COMMUNITY): Payer: Self-pay | Admitting: Nurse Practitioner

## 2018-07-06 VITALS — BP 126/80 | HR 72 | Ht 68.0 in | Wt 234.0 lb

## 2018-07-06 DIAGNOSIS — Z87891 Personal history of nicotine dependence: Secondary | ICD-10-CM | POA: Insufficient documentation

## 2018-07-06 DIAGNOSIS — Z7984 Long term (current) use of oral hypoglycemic drugs: Secondary | ICD-10-CM | POA: Insufficient documentation

## 2018-07-06 DIAGNOSIS — Z7901 Long term (current) use of anticoagulants: Secondary | ICD-10-CM | POA: Insufficient documentation

## 2018-07-06 DIAGNOSIS — E119 Type 2 diabetes mellitus without complications: Secondary | ICD-10-CM | POA: Diagnosis not present

## 2018-07-06 DIAGNOSIS — I481 Persistent atrial fibrillation: Secondary | ICD-10-CM | POA: Insufficient documentation

## 2018-07-06 DIAGNOSIS — I10 Essential (primary) hypertension: Secondary | ICD-10-CM | POA: Diagnosis not present

## 2018-07-06 DIAGNOSIS — Z8249 Family history of ischemic heart disease and other diseases of the circulatory system: Secondary | ICD-10-CM | POA: Diagnosis not present

## 2018-07-06 DIAGNOSIS — Z7982 Long term (current) use of aspirin: Secondary | ICD-10-CM | POA: Insufficient documentation

## 2018-07-06 DIAGNOSIS — E785 Hyperlipidemia, unspecified: Secondary | ICD-10-CM | POA: Insufficient documentation

## 2018-07-06 DIAGNOSIS — I2510 Atherosclerotic heart disease of native coronary artery without angina pectoris: Secondary | ICD-10-CM | POA: Diagnosis not present

## 2018-07-06 DIAGNOSIS — I4819 Other persistent atrial fibrillation: Secondary | ICD-10-CM

## 2018-07-06 DIAGNOSIS — Z951 Presence of aortocoronary bypass graft: Secondary | ICD-10-CM | POA: Diagnosis not present

## 2018-07-06 NOTE — Progress Notes (Signed)
Primary Care Physician: Cassandria Anger, MD Referring Physician: [facility] Cardiologist: Dr. Jen Mow is a 83 y.o. male with a h/o  CAD, s/p CABG, DM,HTN, with chronic shortness of breath but presented to the ER with increase in shortness of breath for several weeks. He was found to be in afib. He has a h/o of afib that occurred at time of by pass in 2008, but has been quiet until now. He is now on eliquis since 6/26. No issues with anticoagulation so far. States that his weight is staying stable at home.   F/u in afib clinic 7/18. He has been on uninterrupted anticoagulation since 6/26 and will now be scheduled for cardioversion.  He continues with shortness of breath with exertions. He is staying stable with his weight.  S/p cardioversion 7/24 which was successful but unfortunately, he had returned to [place]. He did feel improved in SR and would like to pursue antiarrythmic therapy to restore SR since  SR with cardioversion was short lived  F/u 8/22. He continues to load on amiodarone. He will be ready the first week of September and will schedule cardioversion today.  Today, he denies symptoms of palpitations, chest pain,orthopnea, PND, lower extremity edema, dizziness, presyncope, syncope, or neurologic sequela. + for  shortness of breath and fatigue with return to afib.The patient is tolerating medications without difficulties and is otherwise without complaint today.   Past Medical History:  Diagnosis Date  . Arthritis   . Atrial fibrillation ([place])    post op; amiodarone and coumadin continued for 3 mos post op  . CARCINOMA, SKIN, SQUAMOUS CELL 01/07/2010  . Cataract    beginning of cataracts  . COLONIC POLYPS, HX OF 04/26/2007  . Coronary artery disease    s/p CABG 3/12: L-LAD, S-CFX (Dr. Roxy Manns); EF 45% at cath prior to CABG  . DIABETES MELLITUS, TYPE II 04/26/2007  . Hyperlipidemia   . HYPERTENSION 04/26/2007  . NEPHROLITHIASIS, HX OF 04/26/2007  . SBO (small bowel  obstruction) ([place]) 10/2015   Partial   . Shortness of breath dyspnea    with exertion  . Unspecified hearing loss 12/18/2008   Past Surgical History:  Procedure Laterality Date  . BACK SURGERY     disectomy  . CARDIOVERSION N/A 06/08/2018   Procedure: CARDIOVERSION;  Surgeon: Buford Dresser, MD;  Location: [facility] ENDOSCOPY;  Service: Cardiovascular;  Laterality: N/A;  . COLONOSCOPY    . CORONARY ARTERY BYPASS GRAFT  01/12/2011  . LUMBAR LAMINECTOMY/DECOMPRESSION MICRODISCECTOMY N/A 05/08/2015   Procedure: LUMBAR LAMINECTOMY/DECOMPRESSION MICRODISCECTOMY 1 LEVEL Lumbar four five;  Surgeon: Melina Schools, MD;  Location: [facility];  Service: Orthopedics;  Laterality: N/A;  . TOTAL HIP ARTHROPLASTY Left    left on 04/15/07  . TYMPANIC MEMBRANE REPAIR  1981    Current Outpatient Medications  Medication Sig Dispense Refill  . ACCU-CHEK FASTCLIX LANCETS MISC Use to help check blood sugars twice a day 306 each 1  . amiodarone (PACERONE) 200 MG tablet Take 1 tablet by mouth twice a day for 1 month then reduce to 1 tablet a day 60 tablet 1  . apixaban (ELIQUIS) 5 MG TABS tablet Take 1 tablet (5 mg total) by mouth 2 (two) times daily. 60 tablet 3  . aspirin 81 MG tablet Take 81 mg by mouth daily.     . Blood Glucose Monitoring Suppl (ACCU-CHEK AVIVA PLUS) w/Device KIT Use as directed 1 kit 0  . cholecalciferol (VITAMIN D) 1000 units tablet Take 1,000  Units by mouth 2 (two) times daily.    Marland Kitchen diltiazem (CARDIZEM LA) 240 MG 24 hr tablet Take 1 tablet (240 mg total) by mouth daily. 30 tablet 3  . gabapentin (NEURONTIN) 100 MG capsule Take 1 capsule (100 mg total) by mouth 3 (three) times daily. 90 capsule 5  . glucose blood (ACCU-CHEK AVIVA PLUS) test strip 1 each by Other route 2 (two) times daily. Use to check blood sugars twice a day 300 each 1  . metFORMIN (GLUCOPHAGE) 500 MG tablet Take 1 tablet (500 mg total) by mouth 2 (two) times daily with a meal. 270 tablet 2  . repaglinide (PRANDIN) 2 MG  tablet Take 1 tablet (2 mg total) by mouth 3 (three) times daily before meals. 90 tablet 11  . simvastatin (ZOCOR) 20 MG tablet TAKE 1/2 TABLET BY MOUTH EVERY DAY AT BEDTIME 45 tablet 2   No current facility-administered medications for this encounter.     No Known Allergies  Social History   Socioeconomic History  . Marital status: Married    Spouse name: Not on file  . Number of children: Not on file  . Years of education: Not on file  . Highest education level: Not on file  Occupational History  . Not on file  Social Needs  . Financial resource strain: Not on file  . Food insecurity:    Worry: Not on file    Inability: Not on file  . Transportation needs:    Medical: Not on file    Non-medical: Not on file  Tobacco Use  . Smoking status: Former Smoker    Last attempt to quit: 01/02/1981    Years since quitting: 37.5  . Smokeless tobacco: Never Used  Substance and Sexual Activity  . Alcohol use: No    Alcohol/week: 0.0 standard drinks  . Drug use: No  . Sexual activity: Not on file  Lifestyle  . Physical activity:    Days per week: Not on file    Minutes per session: Not on file  . Stress: Not on file  Relationships  . Social connections:    Talks on phone: Not on file    Gets together: Not on file    Attends religious service: Not on file    Active member of club or organization: Not on file    Attends meetings of clubs or organizations: Not on file    Relationship status: Not on file  . Intimate partner violence:    Fear of current or ex partner: Not on file    Emotionally abused: Not on file    Physically abused: Not on file    Forced sexual activity: Not on file  Other Topics Concern  . Not on file  Social History Narrative  . Not on file    Family History  Problem Relation Age of Onset  . Heart failure Mother   . Heart disease Father   . Cancer Sister   . Cancer Brother   . Heart disease Brother   . Breast cancer Sister   . Colon cancer Neg Hx      ROS- All systems are reviewed and negative except as per the HPI above  Physical Exam: Vitals:   07/06/18 1534  BP: 126/80  Pulse: 72  Weight: 106.1 kg  Height: '5\' 8"'  (1.727 m)   Wt Readings from Last 3 Encounters:  07/06/18 106.1 kg  07/05/18 107 kg  06/22/18 105.7 kg    Labs: Lab Results  Component  Value Date   NA 137 07/05/2018   K 4.1 07/05/2018   CL 104 07/05/2018   CO2 26 07/05/2018   GLUCOSE 107 (H) 07/05/2018   BUN 17 07/05/2018   CREATININE 1.16 07/05/2018   CALCIUM 9.7 07/05/2018   MG 2.5 (H) 05/10/2018   Lab Results  Component Value Date   INR 2.3 03/03/2012   Lab Results  Component Value Date   CHOL 101 02/07/2018   HDL 43 02/07/2018   LDLCALC 35 02/07/2018   TRIG 117 02/07/2018     GEN- The patient is well appearing, alert and oriented x 3 today.   Head- normocephalic, atraumatic Eyes-  Sclera clear, conjunctiva pink Ears- hearing intact Oropharynx- clear Neck- supple, no JVP Lymph- no cervical lymphadenopathy Lungs- Clear to ausculation bilaterally, normal work of breathing Heart- irregular rate and rhythm, no murmurs, rubs or gallops, PMI not laterally displaced GI- soft, NT, ND, + BS Extremities- no clubbing, cyanosis, or edema MS- no significant deformity or atrophy Skin- no rash or lesion Psych- euthymic mood, full affect Neuro- strength and sensation are intact  EKG- afib at 68  bpm, qrs int 96 ms, qtc 376 ms Epic records reviewed Records reviewed    Assessment and Plan: 1. Persistent  afib Successful cardioversion but with ERAF He has been loading on amiodarone 200 mg bid and will schedule cardioversion for first week of September  Continue  eliquis 5 mg bid, states no missed doses and reminded not to miss doses  Continue CCB, off metoprolol  2. HTN Stable  3. CAD Will have pt f/u with Dr. Harrington Challenger in 4-8 weeks    F/u here  one week after cardioversion   Butch Penny C. Dquan Cortopassi, [facility] [address] [place], [place] [address] [phone]

## 2018-07-07 ENCOUNTER — Other Ambulatory Visit (HOSPITAL_COMMUNITY): Payer: Self-pay | Admitting: Nurse Practitioner

## 2018-07-07 DIAGNOSIS — H401121 Primary open-angle glaucoma, left eye, mild stage: Secondary | ICD-10-CM | POA: Diagnosis not present

## 2018-07-07 DIAGNOSIS — H40021 Open angle with borderline findings, high risk, right eye: Secondary | ICD-10-CM | POA: Diagnosis not present

## 2018-07-07 NOTE — Addendum Note (Signed)
Encounter addended by: Iona Hansen, CMA on: 07/07/2018 8:40 AM  Actions taken: Order list changed, Diagnosis association updated, Sign clinical note

## 2018-07-07 NOTE — Patient Instructions (Addendum)
Your cardioversion is scheduled for : Tuesday July 21, 2018 at 12:45 Arrive at the [facility] and go to admitting at 11:15 am Do Not eat or drink anything after midnight the night prior to your procedure. Take all your medications with a sip of water prior to arrival. Do NOT miss any doses of your blood thinner. You will NOT be able to drive home after your procedure.

## 2018-07-07 NOTE — Addendum Note (Signed)
Encounter addended by: Iona Hansen, CMA on: 07/07/2018 9:56 AM  Actions taken: Sign clinical note

## 2018-07-08 ENCOUNTER — Other Ambulatory Visit (HOSPITAL_COMMUNITY): Payer: Self-pay | Admitting: Nurse Practitioner

## 2018-07-08 ENCOUNTER — Encounter (HOSPITAL_COMMUNITY): Payer: Self-pay | Admitting: *Deleted

## 2018-07-08 ENCOUNTER — Ambulatory Visit: Payer: Medicare Other | Admitting: Internal Medicine

## 2018-07-08 NOTE — Progress Notes (Signed)
Pt dccv instructions

## 2018-07-11 ENCOUNTER — Other Ambulatory Visit (HOSPITAL_COMMUNITY): Payer: Self-pay | Admitting: *Deleted

## 2018-07-25 ENCOUNTER — Ambulatory Visit (HOSPITAL_COMMUNITY): Payer: Medicare Other | Admitting: Anesthesiology

## 2018-07-25 ENCOUNTER — Ambulatory Visit (HOSPITAL_COMMUNITY)
Admission: RE | Admit: 2018-07-25 | Discharge: 2018-07-25 | Disposition: A | Discharge status: Home / Self Care | Payer: Medicare Other | Source: Ambulatory Visit | Attending: Nurse Practitioner | Admitting: Nurse Practitioner

## 2018-07-25 ENCOUNTER — Encounter (HOSPITAL_COMMUNITY)
Admission: RE | Disposition: A | Discharge status: Home / Self Care | Payer: Self-pay | Source: Ambulatory Visit | Attending: Internal Medicine

## 2018-07-25 ENCOUNTER — Ambulatory Visit (HOSPITAL_COMMUNITY)
Admission: RE | Admit: 2018-07-25 | Discharge: 2018-07-25 | Disposition: A | Discharge status: Home / Self Care | Payer: Medicare Other | Source: Ambulatory Visit | Attending: Internal Medicine | Admitting: Internal Medicine

## 2018-07-25 ENCOUNTER — Other Ambulatory Visit: Payer: Self-pay

## 2018-07-25 ENCOUNTER — Encounter (HOSPITAL_COMMUNITY): Payer: Self-pay | Admitting: Anesthesiology

## 2018-07-25 DIAGNOSIS — I10 Essential (primary) hypertension: Secondary | ICD-10-CM | POA: Insufficient documentation

## 2018-07-25 DIAGNOSIS — E669 Obesity, unspecified: Secondary | ICD-10-CM | POA: Insufficient documentation

## 2018-07-25 DIAGNOSIS — E785 Hyperlipidemia, unspecified: Secondary | ICD-10-CM | POA: Diagnosis not present

## 2018-07-25 DIAGNOSIS — I481 Persistent atrial fibrillation: Secondary | ICD-10-CM | POA: Diagnosis not present

## 2018-07-25 DIAGNOSIS — Z7982 Long term (current) use of aspirin: Secondary | ICD-10-CM | POA: Insufficient documentation

## 2018-07-25 DIAGNOSIS — Z7901 Long term (current) use of anticoagulants: Secondary | ICD-10-CM | POA: Diagnosis not present

## 2018-07-25 DIAGNOSIS — E119 Type 2 diabetes mellitus without complications: Secondary | ICD-10-CM | POA: Diagnosis not present

## 2018-07-25 DIAGNOSIS — Z6835 Body mass index (BMI) 35.0-35.9, adult: Secondary | ICD-10-CM | POA: Insufficient documentation

## 2018-07-25 DIAGNOSIS — Z7984 Long term (current) use of oral hypoglycemic drugs: Secondary | ICD-10-CM | POA: Insufficient documentation

## 2018-07-25 DIAGNOSIS — I480 Paroxysmal atrial fibrillation: Secondary | ICD-10-CM

## 2018-07-25 DIAGNOSIS — Z951 Presence of aortocoronary bypass graft: Secondary | ICD-10-CM | POA: Diagnosis not present

## 2018-07-25 DIAGNOSIS — I4891 Unspecified atrial fibrillation: Secondary | ICD-10-CM | POA: Diagnosis not present

## 2018-07-25 DIAGNOSIS — Z87891 Personal history of nicotine dependence: Secondary | ICD-10-CM | POA: Insufficient documentation

## 2018-07-25 DIAGNOSIS — M1990 Unspecified osteoarthritis, unspecified site: Secondary | ICD-10-CM | POA: Insufficient documentation

## 2018-07-25 DIAGNOSIS — I2510 Atherosclerotic heart disease of native coronary artery without angina pectoris: Secondary | ICD-10-CM | POA: Insufficient documentation

## 2018-07-25 HISTORY — PX: CARDIOVERSION: SHX1299

## 2018-07-25 LAB — CBC WITH DIFFERENTIAL/PLATELET
Abs Immature Granulocytes: 0 10*3/uL (ref 0.0–0.1)
Basophils Absolute: 0 10*3/uL (ref 0.0–0.1)
Basophils Relative: 1 %
EOS PCT: 1 %
Eosinophils Absolute: 0.1 10*3/uL (ref 0.0–0.7)
HEMATOCRIT: 42.9 % (ref 39.0–52.0)
HEMOGLOBIN: 13.9 g/dL (ref 13.0–17.0)
Immature Granulocytes: 0 %
LYMPHS ABS: 2.3 10*3/uL (ref 0.7–4.0)
LYMPHS PCT: 32 %
MCH: 30.1 pg (ref 26.0–34.0)
MCHC: 32.4 g/dL (ref 30.0–36.0)
MCV: 92.9 fL (ref 78.0–100.0)
MONO ABS: 0.8 10*3/uL (ref 0.1–1.0)
MONOS PCT: 10 %
Neutro Abs: 4.1 10*3/uL (ref 1.7–7.7)
Neutrophils Relative %: 56 %
Platelets: 240 10*3/uL (ref 150–400)
RBC: 4.62 MIL/uL (ref 4.22–5.81)
RDW: 12.7 % (ref 11.5–15.5)
WBC: 7.3 10*3/uL (ref 4.0–10.5)

## 2018-07-25 LAB — BASIC METABOLIC PANEL
Anion gap: 9 (ref 5–15)
BUN: 14 mg/dL (ref 8–23)
CHLORIDE: 106 mmol/L (ref 98–111)
CO2: 24 mmol/L (ref 22–32)
CREATININE: 1.17 mg/dL (ref 0.61–1.24)
Calcium: 9.3 mg/dL (ref 8.9–10.3)
GFR calc Af Amer: >60 mL/min (ref >60)
GFR calc non Af Amer: 57 mL/min — ABNORMAL LOW (ref >60)
GLUCOSE: 140 mg/dL — AB (ref 70–99)
Potassium: 4.1 mmol/L (ref 3.5–5.1)
Sodium: 139 mmol/L (ref 135–145)

## 2018-07-25 LAB — GLUCOSE, CAPILLARY: Glucose-Capillary: 124 mg/dL — ABNORMAL HIGH (ref 70–99)

## 2018-07-25 SURGERY — CARDIOVERSION
Anesthesia: General

## 2018-07-25 MED ORDER — ONDANSETRON HCL 4 MG/2ML IJ SOLN: 4.0000 mg | Freq: Once | INTRAMUSCULAR | Status: DC | PRN

## 2018-07-25 MED ORDER — SODIUM CHLORIDE 0.9 % IV SOLN
INTRAVENOUS | Status: DC
  Administered 2018-07-25: 10:00:00 via INTRAVENOUS

## 2018-07-25 MED ORDER — LIDOCAINE 2% (20 MG/ML) 5 ML SYRINGE
INTRAMUSCULAR | Status: DC | PRN
  Administered 2018-07-25: 50 mg via INTRAVENOUS

## 2018-07-25 MED ORDER — PROPOFOL 10 MG/ML IV BOLUS
INTRAVENOUS | Status: DC | PRN
  Administered 2018-07-25: 50 mg via INTRAVENOUS

## 2018-07-25 NOTE — Transfer of Care (Signed)
Immediate Anesthesia Transfer of Care Note  Patient: [name]  Procedure(s) Performed: CARDIOVERSION (N/A )  Patient Location: Endoscopy Unit  Anesthesia Type:General  Level of Consciousness: awake and alert   Airway & Oxygen Therapy: Patient Spontanous Breathing  Post-op Assessment: Report given to RN and Post -op Vital signs reviewed and stable  Post vital signs: Reviewed and stable  Last Vitals:  Vitals Value Taken Time  BP    Temp    Pulse    Resp    SpO2      Last Pain:  Vitals:   07/25/18 0920  TempSrc: Oral  PainSc: 0-No pain         Complications: No apparent anesthesia complications

## 2018-07-25 NOTE — H&P (Signed)
Primary Care Physician: Cassandria Anger, MD Referring Physician: [facility] Cardiologist: Dr. Jen Mow is a 83 y.o. male with a h/o  CAD, s/p CABG, DM,HTN, with chronic shortness of breath but presented to the ER with increase in shortness of breath for several weeks. He was found to be in afib. He has a h/o of afib that occurred at time of by pass in 2008, but has been quiet until now. He is now on eliquis since 6/26. No issues with anticoagulation so far. States that his weight is staying stable at home.   F/u in afib clinic 7/18. He has been on uninterrupted anticoagulation since 6/26 and will now be scheduled for cardioversion.  He continues with shortness of breath with exertions. He is staying stable with his weight.  S/p cardioversion 7/24 which was successful but unfortunately, he had returned to [place]. He did feel improved in SR and would like to pursue antiarrythmic therapy to restore SR since  SR with cardioversion was short lived  F/u 8/22. He continues to load on amiodarone. He will be ready the first week of September and will schedule cardioversion today.  Patient presents today for cardioversion    Past Medical History:  Diagnosis Date  . Arthritis   . Atrial fibrillation ([place])    post op; amiodarone and coumadin continued for 3 mos post op  . CARCINOMA, SKIN, SQUAMOUS CELL 01/07/2010  . Cataract    beginning of cataracts  . COLONIC POLYPS, HX OF 04/26/2007  . Coronary artery disease    s/p CABG 3/12: L-LAD, S-CFX (Dr. Roxy Manns); EF 45% at cath prior to CABG  . DIABETES MELLITUS, TYPE II 04/26/2007  . Hyperlipidemia   . HYPERTENSION 04/26/2007  . NEPHROLITHIASIS, HX OF 04/26/2007  . SBO (small bowel obstruction) ([place]) 10/2015   Partial   . Shortness of breath dyspnea    with exertion  . Unspecified hearing loss 12/18/2008   Past Surgical History:  Procedure Laterality Date  . BACK SURGERY     disectomy  . CARDIOVERSION N/A 06/08/2018   Procedure:  CARDIOVERSION;  Surgeon: Buford Dresser, MD;  Location: [facility] ENDOSCOPY;  Service: Cardiovascular;  Laterality: N/A;  . COLONOSCOPY    . CORONARY ARTERY BYPASS GRAFT  01/12/2011  . LUMBAR LAMINECTOMY/DECOMPRESSION MICRODISCECTOMY N/A 05/08/2015   Procedure: LUMBAR LAMINECTOMY/DECOMPRESSION MICRODISCECTOMY 1 LEVEL Lumbar four five;  Surgeon: Melina Schools, MD;  Location: [facility];  Service: Orthopedics;  Laterality: N/A;  . TOTAL HIP ARTHROPLASTY Left    left on 04/15/07  . TYMPANIC MEMBRANE REPAIR  1981    No current facility-administered medications for this encounter.    Current Outpatient Medications  Medication Sig Dispense Refill  . amiodarone (PACERONE) 200 MG tablet TAKE 1 TABLET BY MOUTH TWICE A DAY FOR 1 MONTH THEN REDUCE TO 1 TABLET A DAY (Patient taking differently: Take 200 mg by mouth 2 (two) times daily. ) 60 tablet 0  . apixaban (ELIQUIS) 5 MG TABS tablet Take 1 tablet (5 mg total) by mouth 2 (two) times daily. 60 tablet 3  . aspirin 81 MG tablet Take 81 mg by mouth daily.     . cholecalciferol (VITAMIN D) 1000 units tablet Take 1,000 Units by mouth 2 (two) times daily.    Marland Kitchen diltiazem (CARDIZEM LA) 240 MG 24 hr tablet Take 1 tablet (240 mg total) by mouth daily. 30 tablet 3  . gabapentin (NEURONTIN) 100 MG capsule Take 1 capsule (100 mg total) by mouth 3 (three)  times daily. 90 capsule 5  . metFORMIN (GLUCOPHAGE) 500 MG tablet Take 1 tablet (500 mg total) by mouth 2 (two) times daily with a meal. 270 tablet 2  . Propylene Glycol (SYSTANE BALANCE) 0.6 % SOLN Place 1 drop into both eyes 2 (two) times daily as needed (for dry eyes).    . repaglinide (PRANDIN) 2 MG tablet Take 1 tablet (2 mg total) by mouth 3 (three) times daily before meals. 90 tablet 11  . simvastatin (ZOCOR) 20 MG tablet TAKE 1/2 TABLET BY MOUTH EVERY DAY AT BEDTIME (Patient taking differently: Take 10 mg by mouth at bedtime. ) 45 tablet 2    No Known Allergies  Social History   Socioeconomic History  .  Marital status: Married    Spouse name: Not on file  . Number of children: Not on file  . Years of education: Not on file  . Highest education level: Not on file  Occupational History  . Not on file  Social Needs  . Financial resource strain: Not on file  . Food insecurity:    Worry: Not on file    Inability: Not on file  . Transportation needs:    Medical: Not on file    Non-medical: Not on file  Tobacco Use  . Smoking status: Former Smoker    Last attempt to quit: 01/02/1981    Years since quitting: 37.5  . Smokeless tobacco: Never Used  Substance and Sexual Activity  . Alcohol use: No    Alcohol/week: 0.0 standard drinks  . Drug use: No  . Sexual activity: Not on file  Lifestyle  . Physical activity:    Days per week: Not on file    Minutes per session: Not on file  . Stress: Not on file  Relationships  . Social connections:    Talks on phone: Not on file    Gets together: Not on file    Attends religious service: Not on file    Active member of club or organization: Not on file    Attends meetings of clubs or organizations: Not on file    Relationship status: Not on file  . Intimate partner violence:    Fear of current or ex partner: Not on file    Emotionally abused: Not on file    Physically abused: Not on file    Forced sexual activity: Not on file  Other Topics Concern  . Not on file  Social History Narrative  . Not on file    Family History  Problem Relation Age of Onset  . Heart failure Mother   . Heart disease Father   . Cancer Sister   . Cancer Brother   . Heart disease Brother   . Breast cancer Sister   . Colon cancer Neg Hx     ROS- All systems are reviewed and negative except as per the HPI above  Physical Exam: Vitals:   07/25/18 0920 07/25/18 1000 07/25/18 1010 07/25/18 1020  BP: (!) 159/74 (!) 143/65 (!) 124/58 135/69  Pulse: 76 67 63 62  Resp: 14 (!) 24 (!) 24 (!) 23  Temp: 98 F (36.7 C) 97.6 F (36.4 C)    TempSrc: Oral Oral     SpO2: 94% 92% 93% 93%  Weight: 106.1 kg     Height: 5\' 8"  (1.727 m)      Wt Readings from Last 3 Encounters:  07/25/18 106.1 kg  07/06/18 106.1 kg  07/05/18 107 kg    Labs:  Lab Results  Component Value Date   NA 139 07/25/2018   K 4.1 07/25/2018   CL 106 07/25/2018   CO2 24 07/25/2018   GLUCOSE 140 (H) 07/25/2018   BUN 14 07/25/2018   CREATININE 1.17 07/25/2018   CALCIUM 9.3 07/25/2018   MG 2.5 (H) 05/10/2018   Lab Results  Component Value Date   INR 2.3 03/03/2012   Lab Results  Component Value Date   CHOL 101 02/07/2018   HDL 43 02/07/2018   LDLCALC 35 02/07/2018   TRIG 117 02/07/2018     GEN- The patient is well appearing, alert and oriented x 3 today.   Head- normocephalic, atraumatic Eyes-  Sclera clear, conjunctiva pink Ears- hearing intact Oropharynx- clear Neck- supple, no JVP Lymph- no cervical lymphadenopathy Lungs- Clear to ausculation bilaterally, normal work of breathing Heart- irregular rate and rhythm  No  murmurs, rubs or gallops, PMI not laterally displaced GI- soft, NT, ND, + BS Extremities- no clubbing, cyanosis, or edema MS- no significant deformity or atrophy Psych- euthymic mood, full affect Neuro- strength and sensation are intact      Assessment and Plan: 1. Persistent  afib Pt presents for cardioversion   Acceptabel Discussed procedure with pt who agrees to proceed   Has not missed any medicine  2. HTN Stable  3. CAD No symptoms of angina    F/u here  one week after cardioversion   Butch Penny C. [place], [facility] [address] [place], [place] [address] [phone]

## 2018-07-25 NOTE — Anesthesia Postprocedure Evaluation (Signed)
Anesthesia Post Note  Patient: [name]  Procedure(s) Performed: CARDIOVERSION (N/A )     Patient location during evaluation: PACU Anesthesia Type: General Level of consciousness: awake and alert and oriented Pain management: pain level controlled Vital Signs Assessment: post-procedure vital signs reviewed and stable Respiratory status: spontaneous breathing, nonlabored ventilation and respiratory function stable Cardiovascular status: blood pressure returned to baseline and stable Postop Assessment: no apparent nausea or vomiting Anesthetic complications: no    Last Vitals:  Vitals:   07/25/18 0920 07/25/18 1000  BP: (!) 159/74 (!) 143/65  Pulse: 76 67  Resp: 14 (!) 24  Temp: 36.7 C 36.4 C  SpO2: 94% 92%    Last Pain:  Vitals:   07/25/18 1000  TempSrc: Oral  PainSc: 0-No pain                 Ariyon Gerstenberger A.

## 2018-07-25 NOTE — CV Procedure (Signed)
Cardioversion  Patient sedated by anesthesia with IV lidocaine and propofol  With pads in the AP position, patient was cardioverted to SR with  200 J synchronized biphasic energy Procedure was without complication  12 lead EKG pending   [name]

## 2018-07-25 NOTE — Discharge Instructions (Signed)
Electrical Cardioversion, Care After °This sheet gives you information about how to care for yourself after your procedure. Your health care provider may also give you more specific instructions. If you have problems or questions, contact your health care provider. °What can I expect after the procedure? °After the procedure, it is common to have: °· Some redness on the skin where the shocks were given. ° °Follow these instructions at home: °· Do not drive for 24 hours if you were given a medicine to help you relax (sedative). °· Take over-the-counter and prescription medicines only as told by your health care provider. °· Ask your health care provider how to check your pulse. Check it often. °· Rest for 48 hours after the procedure or as told by your health care provider. °· Avoid or limit your caffeine use as told by your health care provider. °Contact a health care provider if: °· You feel like your heart is beating too quickly or your pulse is not regular. °· You have a serious muscle cramp that does not go away. °Get help right away if: °· You have discomfort in your chest. °· You are dizzy or you feel faint. °· You have trouble breathing or you are short of breath. °· Your speech is slurred. °· You have trouble moving an arm or leg on one side of your body. °· Your fingers or toes turn cold or blue. °This information is not intended to replace advice given to you by your health care provider. Make sure you discuss any questions you have with your health care provider. °Document Released: 08/23/2013 Document Revised: 06/05/2016 Document Reviewed: 05/08/2016 °Elsevier Interactive Patient Education © 2018 [employer] ° °

## 2018-07-25 NOTE — Anesthesia Preprocedure Evaluation (Signed)
Anesthesia Evaluation  Patient identified by MRN, date of birth, ID band Patient awake    Reviewed: Allergy & Precautions, NPO status   Airway Mallampati: II  TM Distance: >3 FB Neck ROM: Full    Dental  (+) Teeth Intact   Pulmonary shortness of breath and with exertion, former smoker,    Pulmonary exam normal breath sounds clear to auscultation       Cardiovascular hypertension, Pt. on medications + CAD  Normal cardiovascular exam+ dysrhythmias Atrial Fibrillation  Rhythm:Irregular Rate:Normal     Neuro/Psych negative neurological ROS  negative psych ROS   GI/Hepatic negative GI ROS, Neg liver ROS,   Endo/Other  diabetes, Well Controlled, Type 2, Oral Hypoglycemic AgentsHyperlipidemia  Renal/GU negative Renal ROS  negative genitourinary   Musculoskeletal  (+) Arthritis , Osteoarthritis,    Abdominal (+) + obese,   Peds  Hematology negative hematology ROS (+)   Anesthesia Other Findings   Reproductive/Obstetrics                             Anesthesia Physical Anesthesia Plan  ASA: III  Anesthesia Plan: General   Post-op Pain Management:    Induction: Intravenous  PONV Risk Score and Plan: 2 and Ondansetron, Propofol infusion and Treatment may vary due to age or medical condition  Airway Management Planned: Natural Airway and Mask  Additional Equipment:   Intra-op Plan:   Post-operative Plan:   Informed Consent: I have reviewed the patients History and Physical, chart, labs and discussed the procedure including the risks, benefits and alternatives for the proposed anesthesia with the patient or authorized representative who has indicated his/her understanding and acceptance.   Dental advisory given  Plan Discussed with: CRNA and Surgeon  Anesthesia Plan Comments:         Anesthesia Quick Evaluation

## 2018-07-26 ENCOUNTER — Encounter (HOSPITAL_COMMUNITY): Payer: Self-pay | Admitting: Internal Medicine

## 2018-07-26 ENCOUNTER — Ambulatory Visit (HOSPITAL_COMMUNITY): Payer: Medicare Other | Admitting: Nurse Practitioner

## 2018-07-28 ENCOUNTER — Ambulatory Visit (HOSPITAL_COMMUNITY): Payer: Medicare Other | Admitting: Nurse Practitioner

## 2018-07-28 ENCOUNTER — Other Ambulatory Visit: Payer: Self-pay | Admitting: Internal Medicine

## 2018-07-31 ENCOUNTER — Other Ambulatory Visit (HOSPITAL_COMMUNITY): Payer: Self-pay | Admitting: Nurse Practitioner

## 2018-08-01 ENCOUNTER — Ambulatory Visit (HOSPITAL_COMMUNITY)
Admission: RE | Admit: 2018-08-01 | Discharge: 2018-08-01 | Disposition: A | Discharge status: Home / Self Care | Payer: Medicare Other | Source: Ambulatory Visit | Attending: Nurse Practitioner | Admitting: Nurse Practitioner

## 2018-08-01 ENCOUNTER — Encounter (HOSPITAL_COMMUNITY): Payer: Self-pay | Admitting: Nurse Practitioner

## 2018-08-01 VITALS — BP 146/82 | HR 79 | Ht 68.0 in | Wt 237.0 lb

## 2018-08-01 DIAGNOSIS — Z79899 Other long term (current) drug therapy: Secondary | ICD-10-CM | POA: Diagnosis not present

## 2018-08-01 DIAGNOSIS — I481 Persistent atrial fibrillation: Secondary | ICD-10-CM | POA: Insufficient documentation

## 2018-08-01 DIAGNOSIS — I10 Essential (primary) hypertension: Secondary | ICD-10-CM | POA: Diagnosis not present

## 2018-08-01 DIAGNOSIS — E119 Type 2 diabetes mellitus without complications: Secondary | ICD-10-CM | POA: Diagnosis not present

## 2018-08-01 DIAGNOSIS — Z951 Presence of aortocoronary bypass graft: Secondary | ICD-10-CM | POA: Insufficient documentation

## 2018-08-01 DIAGNOSIS — Z7982 Long term (current) use of aspirin: Secondary | ICD-10-CM | POA: Insufficient documentation

## 2018-08-01 DIAGNOSIS — Z7901 Long term (current) use of anticoagulants: Secondary | ICD-10-CM | POA: Insufficient documentation

## 2018-08-01 DIAGNOSIS — E785 Hyperlipidemia, unspecified: Secondary | ICD-10-CM | POA: Diagnosis not present

## 2018-08-01 DIAGNOSIS — Z87891 Personal history of nicotine dependence: Secondary | ICD-10-CM | POA: Diagnosis not present

## 2018-08-01 DIAGNOSIS — I2510 Atherosclerotic heart disease of native coronary artery without angina pectoris: Secondary | ICD-10-CM | POA: Diagnosis not present

## 2018-08-01 DIAGNOSIS — I4819 Other persistent atrial fibrillation: Secondary | ICD-10-CM

## 2018-08-01 DIAGNOSIS — Z7984 Long term (current) use of oral hypoglycemic drugs: Secondary | ICD-10-CM | POA: Insufficient documentation

## 2018-08-01 NOTE — Progress Notes (Signed)
Primary Care Physician: Cassandria Anger, MD Referring Physician: [facility] Cardiologist: Dr. Jen Mow is a 83 y.o. male with a h/o  CAD, s/p CABG, DM,HTN, with chronic shortness of breath but presented to the ER with increase in shortness of breath for several weeks. He was found to be in afib. He has a h/o of afib that occurred at time of by pass in 2008, but has been quiet until now. He is now on eliquis since 6/26. No issues with anticoagulation so far. States that his weight is staying stable at home.   F/u in afib clinic 7/18. He has been on uninterrupted anticoagulation since 6/26 and will now be scheduled for cardioversion.  He continues with shortness of breath with exertions. He is staying stable with his weight.  S/p cardioversion 7/24 which was successful but unfortunately, he had returned to [place]. He did feel improved in SR and would like to pursue antiarrythmic therapy to restore SR since  SR with cardioversion was short lived  F/u 8/22. He continues to load on amiodarone. He will be ready the first week of September and will schedule cardioversion today.  S/p successful cardioversion, 9/9. He remains in SR.He feels improved. He is now on amiodarone 200 mg a day.   Today, he denies symptoms of palpitations, chest pain,orthopnea, PND, lower extremity edema, dizziness, presyncope, syncope, or neurologic sequela. + for improved fatigue, some baseline shortness of breath.The patient is tolerating medications without difficulties and is otherwise without complaint today.   Past Medical History:  Diagnosis Date  . Arthritis   . Atrial fibrillation ([place])    post op; amiodarone and coumadin continued for 3 mos post op  . CARCINOMA, SKIN, SQUAMOUS CELL 01/07/2010  . Cataract    beginning of cataracts  . COLONIC POLYPS, HX OF 04/26/2007  . Coronary artery disease    s/p CABG 3/12: L-LAD, S-CFX (Dr. Roxy Manns); EF 45% at cath prior to CABG  . DIABETES MELLITUS, TYPE II  04/26/2007  . Hyperlipidemia   . HYPERTENSION 04/26/2007  . NEPHROLITHIASIS, HX OF 04/26/2007  . SBO (small bowel obstruction) ([place]) 10/2015   Partial   . Shortness of breath dyspnea    with exertion  . Unspecified hearing loss 12/18/2008   Past Surgical History:  Procedure Laterality Date  . BACK SURGERY     disectomy  . CARDIOVERSION N/A 06/08/2018   Procedure: CARDIOVERSION;  Surgeon: Buford Dresser, MD;  Location: [facility] ENDOSCOPY;  Service: Cardiovascular;  Laterality: N/A;  . CARDIOVERSION N/A 07/25/2018   Procedure: CARDIOVERSION;  Surgeon: Fay Records, MD;  Location: [place];  Service: Cardiovascular;  Laterality: N/A;  . COLONOSCOPY    . CORONARY ARTERY BYPASS GRAFT  01/12/2011  . LUMBAR LAMINECTOMY/DECOMPRESSION MICRODISCECTOMY N/A 05/08/2015   Procedure: LUMBAR LAMINECTOMY/DECOMPRESSION MICRODISCECTOMY 1 LEVEL Lumbar four five;  Surgeon: Melina Schools, MD;  Location: [place];  Service: Orthopedics;  Laterality: N/A;  . TOTAL HIP ARTHROPLASTY Left    left on 04/15/07  . TYMPANIC MEMBRANE REPAIR  1981    Current Outpatient Medications  Medication Sig Dispense Refill  . amiodarone (PACERONE) 200 MG tablet Take 1 tablet (200 mg total) by mouth daily. 90 tablet 1  . apixaban (ELIQUIS) 5 MG TABS tablet Take 1 tablet (5 mg total) by mouth 2 (two) times daily. 60 tablet 3  . aspirin 81 MG tablet Take 81 mg by mouth daily.     . cholecalciferol (VITAMIN D) 1000 units tablet Take 1,000 Units  by mouth 2 (two) times daily.    Marland Kitchen diltiazem (CARDIZEM LA) 240 MG 24 hr tablet Take 1 tablet (240 mg total) by mouth daily. 30 tablet 3  . gabapentin (NEURONTIN) 100 MG capsule TAKE 1 CAPSULE BY MOUTH THREE TIMES A DAY 270 capsule 1  . metFORMIN (GLUCOPHAGE) 500 MG tablet Take 1 tablet (500 mg total) by mouth 2 (two) times daily with a meal. 270 tablet 2  . Propylene Glycol (SYSTANE BALANCE) 0.6 % SOLN Place 1 drop into both eyes 2 (two) times daily as needed (for dry eyes).    . repaglinide  (PRANDIN) 2 MG tablet Take 1 tablet (2 mg total) by mouth 3 (three) times daily before meals. 90 tablet 11  . simvastatin (ZOCOR) 20 MG tablet TAKE 1/2 TABLET BY MOUTH EVERY DAY AT BEDTIME (Patient taking differently: Take 10 mg by mouth at bedtime. ) 45 tablet 2   No current facility-administered medications for this encounter.     No Known Allergies  Social History   Socioeconomic History  . Marital status: Married    Spouse name: Not on file  . Number of children: Not on file  . Years of education: Not on file  . Highest education level: Not on file  Occupational History  . Not on file  Social Needs  . Financial resource strain: Not on file  . Food insecurity:    Worry: Not on file    Inability: Not on file  . Transportation needs:    Medical: Not on file    Non-medical: Not on file  Tobacco Use  . Smoking status: Former Smoker    Last attempt to quit: 01/02/1981    Years since quitting: 37.6  . Smokeless tobacco: Never Used  Substance and Sexual Activity  . Alcohol use: No    Alcohol/week: 0.0 standard drinks  . Drug use: No  . Sexual activity: Not on file  Lifestyle  . Physical activity:    Days per week: Not on file    Minutes per session: Not on file  . Stress: Not on file  Relationships  . Social connections:    Talks on phone: Not on file    Gets together: Not on file    Attends religious service: Not on file    Active member of club or organization: Not on file    Attends meetings of clubs or organizations: Not on file    Relationship status: Not on file  . Intimate partner violence:    Fear of current or ex partner: Not on file    Emotionally abused: Not on file    Physically abused: Not on file    Forced sexual activity: Not on file  Other Topics Concern  . Not on file  Social History Narrative  . Not on file    Family History  Problem Relation Age of Onset  . Heart failure Mother   . Heart disease Father   . Cancer Sister   . Cancer Brother    . Heart disease Brother   . Breast cancer Sister   . Colon cancer Neg Hx     ROS- All systems are reviewed and negative except as per the HPI above  Physical Exam: Vitals:   08/01/18 1109  BP: (!) 146/82  Pulse: 79  Weight: 107.5 kg  Height: 5\' 8"  (1.727 m)   Wt Readings from Last 3 Encounters:  08/01/18 107.5 kg  07/25/18 106.1 kg  07/06/18 106.1 kg  Labs: Lab Results  Component Value Date   NA 139 07/25/2018   K 4.1 07/25/2018   CL 106 07/25/2018   CO2 24 07/25/2018   GLUCOSE 140 (H) 07/25/2018   BUN 14 07/25/2018   CREATININE 1.17 07/25/2018   CALCIUM 9.3 07/25/2018   MG 2.5 (H) 05/10/2018   Lab Results  Component Value Date   INR 2.3 03/03/2012   Lab Results  Component Value Date   CHOL 101 02/07/2018   HDL 43 02/07/2018   LDLCALC 35 02/07/2018   TRIG 117 02/07/2018     GEN- The patient is well appearing, alert and oriented x 3 today.   Head- normocephalic, atraumatic Eyes-  Sclera clear, conjunctiva pink Ears- hearing intact Oropharynx- clear Neck- supple, no JVP Lymph- no cervical lymphadenopathy Lungs- Clear to ausculation bilaterally, normal work of breathing Heart- regular rate and rhythm, no murmurs, rubs or gallops, PMI not laterally displaced GI- soft, NT, ND, + BS Extremities- no clubbing, cyanosis, or edema MS- no significant deformity or atrophy Skin- no rash or lesion Psych- euthymic mood, full affect Neuro- strength and sensation are intact  EKG SR at 79 bpm, PR int 241 ms, qrs int 96 ms, qtc 454 ms Epic records reviewed Records reviewed    Assessment and Plan: 1. Persistent  afib Successful cardioversion but with ERAF Successful cardioversion after loading  on amiodarone 200 mg bid and now is on  200 mg daily and maintaining SR Continue  eliquis 5 mg bid,  reminded not to miss doses  Continue CCB, off metoprolol  2. HTN Stable  3. CAD Currently no exertional chest pain   Will need f/u liver/TSH in a few weeks, he  sees his PCP 10/1 and I will ask to have drawn then as his PCP usually gets blood every 3 months   Butch Penny C. Brandi Armato, [facility] [address] [place], [place] [address] [phone]

## 2018-08-08 ENCOUNTER — Ambulatory Visit: Payer: Medicare Other | Admitting: Internal Medicine

## 2018-08-08 ENCOUNTER — Encounter: Payer: Self-pay | Admitting: Internal Medicine

## 2018-08-08 VITALS — BP 142/64 | HR 62 | Ht 68.0 in | Wt 239.0 lb

## 2018-08-08 DIAGNOSIS — R0602 Shortness of breath: Secondary | ICD-10-CM | POA: Diagnosis not present

## 2018-08-08 DIAGNOSIS — E785 Hyperlipidemia, unspecified: Secondary | ICD-10-CM | POA: Diagnosis not present

## 2018-08-08 DIAGNOSIS — I481 Persistent atrial fibrillation: Secondary | ICD-10-CM

## 2018-08-08 DIAGNOSIS — I2510 Atherosclerotic heart disease of native coronary artery without angina pectoris: Secondary | ICD-10-CM

## 2018-08-08 DIAGNOSIS — I4819 Other persistent atrial fibrillation: Secondary | ICD-10-CM

## 2018-08-08 DIAGNOSIS — Z0000 Encounter for general adult medical examination without abnormal findings: Secondary | ICD-10-CM

## 2018-08-08 MED ORDER — METOPROLOL SUCCINATE ER 25 MG PO TB24: 25.0000 mg | ORAL_TABLET | Freq: Every day | ORAL | 3 refills | Status: DC

## 2018-08-08 NOTE — Patient Instructions (Signed)
Your physician has recommended you make the following change in your medication:  1.) stop diltiazem 2.) start metoprolol succinate (Toprol XL) 25 mg once a day  Your physician has requested that you have an echocardiogram. Echocardiography is a painless test that uses sound waves to create images of your heart. It provides your doctor with information about the size and shape of your heart and how well your heart's chambers and valves are working. This procedure takes approximately one hour. There are no restrictions for this procedure.

## 2018-08-08 NOTE — Progress Notes (Addendum)
Cardiology Office Note   Date:  08/08/2018   ID:  [name], DOB 07-24-1935, MRN [id number]  PCP:  John Anger, MD  Cardiologist:   John Carnes, MD   F/u of atrial fibrillaiton        History of Present Illness: [name] is a 83 y.o. male with a history ofCAD, (s/p CABG 01/2011 LIMA to LAD; SVG to Cx) Post op afib (Rx coumadin and amio for a period, diabetes, hypertension, nephrolithiasis, hyperlipidemia, postoperative atrial fibrillation.. .  I saw the pt in March 2109   Since then he was seen in ED in June with worsening SOB and LE edema  Found to be in atrial fibrillation    Referred to afib cilnic   Seen by Maximino Greenland in July  At end of July had cardioversion  Returned to Afib   Seen again in afib cliin   Placed on amiodarone  I cardioverted him to SR on ealirer this month  Since seen he denies palpitatons   He does say he gets SOB with activity  Current Meds  Medication Sig  . amiodarone (PACERONE) 200 MG tablet Take 1 tablet (200 mg total) by mouth daily.  Marland Kitchen apixaban (ELIQUIS) 5 MG TABS tablet Take 1 tablet (5 mg total) by mouth 2 (two) times daily.  Marland Kitchen aspirin 81 MG tablet Take 81 mg by mouth daily.   . cholecalciferol (VITAMIN D) 1000 units tablet Take 1,000 Units by mouth 2 (two) times daily.  Marland Kitchen gabapentin (NEURONTIN) 100 MG capsule TAKE 1 CAPSULE BY MOUTH THREE TIMES A DAY  . metFORMIN (GLUCOPHAGE) 500 MG tablet Take 1 tablet (500 mg total) by mouth 2 (two) times daily with a meal.  . Propylene Glycol (SYSTANE BALANCE) 0.6 % SOLN Place 1 drop into both eyes 2 (two) times daily as needed (for dry eyes).  . repaglinide (PRANDIN) 2 MG tablet Take 2 mg by mouth 2 (two) times daily before a meal.  . simvastatin (ZOCOR) 10 MG tablet Take 10 mg by mouth daily.  . [DISCONTINUED] diltiazem (CARDIZEM LA) 240 MG 24 hr tablet Take 1 tablet (240 mg total) by mouth daily.     Allergies:   Patient has no known allergies.   Past Medical History:    Diagnosis Date  . Arthritis   . Atrial fibrillation ([place])    post op; amiodarone and coumadin continued for 3 mos post op  . CARCINOMA, SKIN, SQUAMOUS CELL 01/07/2010  . Cataract    beginning of cataracts  . COLONIC POLYPS, HX OF 04/26/2007  . Coronary artery disease    s/p CABG 3/12: L-LAD, S-CFX (Dr. Roxy Manns); EF 45% at cath prior to CABG  . DIABETES MELLITUS, TYPE II 04/26/2007  . Hyperlipidemia   . HYPERTENSION 04/26/2007  . NEPHROLITHIASIS, HX OF 04/26/2007  . SBO (small bowel obstruction) ([place]) 10/2015   Partial   . Shortness of breath dyspnea    with exertion  . Unspecified hearing loss 12/18/2008    Past Surgical History:  Procedure Laterality Date  . BACK SURGERY     disectomy  . CARDIOVERSION N/A 06/08/2018   Procedure: CARDIOVERSION;  Surgeon: Buford Dresser, MD;  Location: [facility] ENDOSCOPY;  Service: Cardiovascular;  Laterality: N/A;  . CARDIOVERSION N/A 07/25/2018   Procedure: CARDIOVERSION;  Surgeon: Fay Records, MD;  Location: [place];  Service: Cardiovascular;  Laterality: N/A;  . COLONOSCOPY    . CORONARY ARTERY BYPASS GRAFT  01/12/2011  . LUMBAR LAMINECTOMY/DECOMPRESSION MICRODISCECTOMY N/A 05/08/2015  Procedure: LUMBAR LAMINECTOMY/DECOMPRESSION MICRODISCECTOMY 1 LEVEL Lumbar four five;  Surgeon: Melina Schools, MD;  Location: [place];  Service: Orthopedics;  Laterality: N/A;  . TOTAL HIP ARTHROPLASTY Left    left on 04/15/07  . TYMPANIC MEMBRANE REPAIR  1981     Social History:  The patient  reports that he quit smoking about 37 years ago. He has never used smokeless tobacco. He reports that he does not drink alcohol or use drugs.   Family History:  The patient's family history includes Breast cancer in his sister; Cancer in his brother and sister; Heart disease in his brother and father; Heart failure in his mother.    ROS:  Please see the history of present illness. All other systems are reviewed and  Negative to the above problem except as noted.     PHYSICAL EXAM: VS:  BP (!) 142/64   Pulse 62   Ht 5\' 8"  (1.727 m)   Wt 239 lb (108.4 kg)   SpO2 92%   BMI 36.34 kg/m   GEN:  Morbidly obese 83 yo in no acute distress  HEENT: normal  Neck: JVP is normal  No carotid bruits, or masses Cardiac:  Irreg rate and rhytm; no murmurs, rubs, or gallops, 1+   edema  Respiratory:  clear to auscultation bilaterally, normal work of breathing GI: soft, nontender, nondistended, + BS  No hepatomegaly  MS: no deformity Moving all extremities   Skin: warm and dry, no rash Neuro:  Strength and sensation are intact Psych: euthymic mood, full affect   EKG:  EKG Is ordered today   Atrial fibrillation  61 bpm   Anteroseptal MI   Lipid Panel    Component Value Date/Time   CHOL 101 02/07/2018 1502   TRIG 117 02/07/2018 1502   TRIG 78 11/05/2006 1123   HDL 43 02/07/2018 1502   CHOLHDL 2.3 02/07/2018 1502   CHOLHDL 2.5 01/17/2016 1209   VLDL 20 01/17/2016 1209   LDLCALC 35 02/07/2018 1502      Wt Readings from Last 3 Encounters:  08/08/18 239 lb (108.4 kg)  08/01/18 237 lb (107.5 kg)  07/25/18 234 lb (106.1 kg)      ASSESSMENT AND PLAN:  1 Atrial fibrillation  The patient is back in afib   I think he is symptomati with increased SOB I would recomm stopping diltiazem  Start low dose metoprolol  Continue amiodarone Get echo.   Discuss with EP  2  CAD   Pt s/p CABG in 2012 Get echo    3  HTN   Need to follow with changes in meds   4  HL  Check lipids today    5DM  Continue meds     5  Morbid obesity   Needs to lose wt   Current medicines are reviewed at length with the patient today.  The patient does not have concerns regarding medicines.  Signed, John Carnes, MD  08/08/2018 9:31 PM    [facility] [place], [place], [place]  [address] Phone: [phone]; Fax: [phone]

## 2018-08-12 ENCOUNTER — Other Ambulatory Visit: Payer: Self-pay

## 2018-08-12 ENCOUNTER — Ambulatory Visit (HOSPITAL_COMMUNITY): Payer: Medicare Other | Attending: Internal Medicine

## 2018-08-12 DIAGNOSIS — E119 Type 2 diabetes mellitus without complications: Secondary | ICD-10-CM | POA: Insufficient documentation

## 2018-08-12 DIAGNOSIS — I340 Nonrheumatic mitral (valve) insufficiency: Secondary | ICD-10-CM | POA: Insufficient documentation

## 2018-08-12 DIAGNOSIS — R0602 Shortness of breath: Secondary | ICD-10-CM | POA: Diagnosis not present

## 2018-08-12 DIAGNOSIS — Z951 Presence of aortocoronary bypass graft: Secondary | ICD-10-CM | POA: Insufficient documentation

## 2018-08-12 DIAGNOSIS — I2510 Atherosclerotic heart disease of native coronary artery without angina pectoris: Secondary | ICD-10-CM | POA: Insufficient documentation

## 2018-08-12 DIAGNOSIS — I481 Persistent atrial fibrillation: Secondary | ICD-10-CM | POA: Diagnosis not present

## 2018-08-12 DIAGNOSIS — I10 Essential (primary) hypertension: Secondary | ICD-10-CM | POA: Insufficient documentation

## 2018-08-12 DIAGNOSIS — E785 Hyperlipidemia, unspecified: Secondary | ICD-10-CM | POA: Diagnosis not present

## 2018-08-12 DIAGNOSIS — I4819 Other persistent atrial fibrillation: Secondary | ICD-10-CM

## 2018-08-12 DIAGNOSIS — R600 Localized edema: Secondary | ICD-10-CM | POA: Insufficient documentation

## 2018-08-12 MED ORDER — PERFLUTREN LIPID MICROSPHERE
1.0000 mL | INTRAVENOUS | Status: AC | PRN
  Administered 2018-08-12: 3 mL via INTRAVENOUS

## 2018-08-16 ENCOUNTER — Encounter: Payer: Self-pay | Admitting: Internal Medicine

## 2018-08-16 ENCOUNTER — Telehealth: Payer: Self-pay | Admitting: *Deleted

## 2018-08-16 ENCOUNTER — Ambulatory Visit (INDEPENDENT_AMBULATORY_CARE_PROVIDER_SITE_OTHER): Payer: Medicare Other | Admitting: Internal Medicine

## 2018-08-16 VITALS — BP 138/82 | HR 64 | Temp 98.1°F | Ht 68.0 in | Wt 237.0 lb

## 2018-08-16 DIAGNOSIS — I4891 Unspecified atrial fibrillation: Secondary | ICD-10-CM

## 2018-08-16 DIAGNOSIS — I4819 Other persistent atrial fibrillation: Secondary | ICD-10-CM

## 2018-08-16 DIAGNOSIS — I10 Essential (primary) hypertension: Secondary | ICD-10-CM

## 2018-08-16 DIAGNOSIS — R609 Edema, unspecified: Secondary | ICD-10-CM | POA: Diagnosis not present

## 2018-08-16 DIAGNOSIS — Z23 Encounter for immunization: Secondary | ICD-10-CM | POA: Diagnosis not present

## 2018-08-16 NOTE — Progress Notes (Signed)
Subjective:  Patient ID: [name], male    DOB: September 20, 1935  Age: 83 y.o. MRN: [id number]  CC: No chief complaint on file.   HPI [name] presents for HTN, edema, A fib f/u  URI sx's are resolving...  Outpatient Medications Prior to Visit  Medication Sig Dispense Refill  . amiodarone (PACERONE) 200 MG tablet Take 1 tablet (200 mg total) by mouth daily. 90 tablet 1  . apixaban (ELIQUIS) 5 MG TABS tablet Take 1 tablet (5 mg total) by mouth 2 (two) times daily. 60 tablet 3  . aspirin 81 MG tablet Take 81 mg by mouth daily.     . cholecalciferol (VITAMIN D) 1000 units tablet Take 1,000 Units by mouth 2 (two) times daily.    Marland Kitchen gabapentin (NEURONTIN) 100 MG capsule TAKE 1 CAPSULE BY MOUTH THREE TIMES A DAY 270 capsule 1  . metFORMIN (GLUCOPHAGE) 500 MG tablet Take 1 tablet (500 mg total) by mouth 2 (two) times daily with a meal. 270 tablet 2  . metoprolol succinate (TOPROL XL) 25 MG 24 hr tablet Take 1 tablet (25 mg total) by mouth daily. 90 tablet 3  . Propylene Glycol (SYSTANE BALANCE) 0.6 % SOLN Place 1 drop into both eyes 2 (two) times daily as needed (for dry eyes).    . repaglinide (PRANDIN) 2 MG tablet Take 2 mg by mouth 2 (two) times daily before a meal.    . simvastatin (ZOCOR) 10 MG tablet Take 10 mg by mouth daily.     No facility-administered medications prior to visit.     ROS: Review of Systems  Constitutional: Positive for fatigue. Negative for appetite change and unexpected weight change.  HENT: Negative for congestion, nosebleeds, sneezing, sore throat and trouble swallowing.   Eyes: Negative for itching and visual disturbance.  Respiratory: Positive for chest tightness and shortness of breath. Negative for cough.   Cardiovascular: Positive for palpitations. Negative for chest pain and leg swelling.  Gastrointestinal: Negative for abdominal distention, blood in stool, diarrhea and nausea.  Genitourinary: Negative for frequency and hematuria.    Musculoskeletal: Positive for arthralgias. Negative for back pain, gait problem, joint swelling and neck pain.  Skin: Negative for rash.  Neurological: Negative for dizziness, tremors, speech difficulty and weakness.  Psychiatric/Behavioral: Negative for agitation, dysphoric mood and sleep disturbance. The patient is not nervous/anxious.     Objective:  BP 138/82 (BP Location: Left Arm, Patient Position: Sitting, Cuff Size: Normal)   Pulse 64   Temp 98.1 F (36.7 C) (Oral)   Ht 5\' 8"  (1.727 m)   Wt 237 lb (107.5 kg)   SpO2 95%   BMI 36.04 kg/m   BP Readings from Last 3 Encounters:  08/16/18 138/82  08/08/18 (!) 142/64  08/01/18 (!) 146/82    Wt Readings from Last 3 Encounters:  08/16/18 237 lb (107.5 kg)  08/08/18 239 lb (108.4 kg)  08/01/18 237 lb (107.5 kg)    Physical Exam  Constitutional: He is oriented to person, place, and time. He appears well-developed. No distress.  NAD  HENT:  Mouth/Throat: Oropharynx is clear and moist.  Eyes: Pupils are equal, round, and reactive to light. Conjunctivae are normal.  Neck: Normal range of motion. No JVD present. No thyromegaly present.  Cardiovascular: Normal rate, normal heart sounds and intact distal pulses. Exam reveals no gallop and no friction rub.  No murmur heard. Pulmonary/Chest: Effort normal and breath sounds normal. No respiratory distress. He has no wheezes. He has no rales.  He exhibits no tenderness.  Abdominal: Soft. Bowel sounds are normal. He exhibits no distension and no mass. There is no tenderness. There is no rebound and no guarding.  Musculoskeletal: Normal range of motion. He exhibits edema. He exhibits no tenderness.  Lymphadenopathy:    He has no cervical adenopathy.  Neurological: He is alert and oriented to person, place, and time. He has normal reflexes. No cranial nerve deficit. He exhibits normal muscle tone. He displays a negative Romberg sign. Coordination and gait normal.  Skin: Skin is warm and  dry. No rash noted.  Psychiatric: He has a normal mood and affect. His behavior is normal. Judgment and thought content normal.  irreg RR Obese Trace edema L>R   Lab Results  Component Value Date   WBC 7.3 07/25/2018   HGB 13.9 07/25/2018   HCT 42.9 07/25/2018   PLT 240 07/25/2018   GLUCOSE 140 (H) 07/25/2018   CHOL 101 02/07/2018   TRIG 117 02/07/2018   HDL 43 02/07/2018   LDLCALC 35 02/07/2018   ALT 25 06/21/2018   AST 26 06/21/2018   NA 139 07/25/2018   K 4.1 07/25/2018   CL 106 07/25/2018   CREATININE 1.17 07/25/2018   BUN 14 07/25/2018   CO2 24 07/25/2018   TSH 2.05 07/02/2017   PSA 1.18 07/02/2017   INR 2.3 03/03/2012   HGBA1C 6.9 (H) 07/05/2018   MICROALBUR 0.7 04/04/2018    No results found.  Assessment & Plan:   There are no diagnoses linked to this encounter.   No orders of the defined types were placed in this encounter.    Follow-up: No follow-ups on file.  Walker Kehr, MD

## 2018-08-16 NOTE — Telephone Encounter (Signed)
Order placed for ltd echo. Unable to reach patient at this time.  Fast busy signal only.

## 2018-08-16 NOTE — Telephone Encounter (Signed)
-----   Message from Fay Records, MD sent at 08/15/2018  8:47 PM EDT ----- I have reviewed echo images   Difficult study   I am not convinced of reported LVEF  I would recomm limited TTE with Defininty to define LVEF and wall motion\ Pt in atrial fibrillation

## 2018-08-17 ENCOUNTER — Encounter (HOSPITAL_COMMUNITY): Payer: Self-pay | Admitting: Radiology

## 2018-08-17 NOTE — Progress Notes (Signed)
I reviewed echocardiogram performed on 08/12/18. The echocardiogram was performed with Definity. Do you still want another limited echocardiogram performed?

## 2018-08-22 NOTE — Telephone Encounter (Signed)
° °  Patient calling to request echo results. Please call [phone]

## 2018-08-23 ENCOUNTER — Other Ambulatory Visit: Payer: Self-pay | Admitting: Internal Medicine

## 2018-08-23 IMAGING — CT CT ANGIO CHEST
2 of 8 series · 18 of 46 positions shown · IV contrast (OMNI)
Comparison: Chest 05/10/2018

CLINICAL DATA: Short of breath.  Suspect pulmonary embolism

EXAM:
CT ANGIOGRAPHY CHEST WITH CONTRAST
TECHNIQUE: Multidetector CT imaging of the chest was performed using the
standard protocol during bolus administration of intravenous
contrast. Multiplanar CT image reconstructions and MIPs were
obtained to evaluate the vascular anatomy.
CONTRAST:  100mL 9JERQH-CI2 IOPAMIDOL (9JERQH-CI2) INJECTION 76%

[Series 6: thins · axial · 0.81mm/px · z∈[+1177,+1424]mm · 15 of 273 slices shown]
[im 13/273  lung]
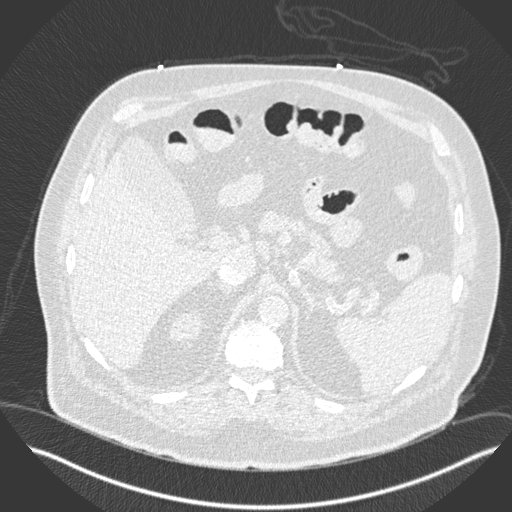
[im 38/273  soft-tissue]
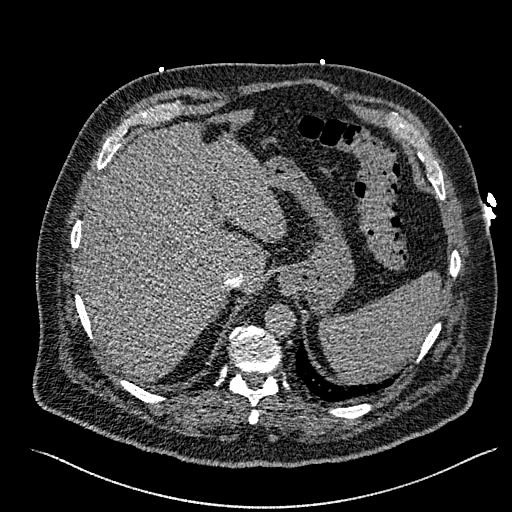
[im 50/273  lung]
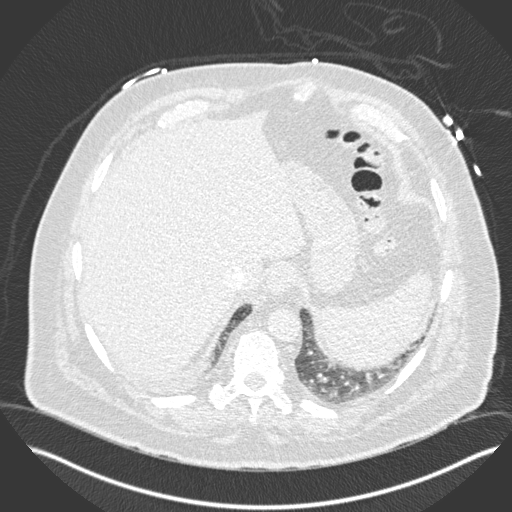
[im 62/273  soft-tissue]
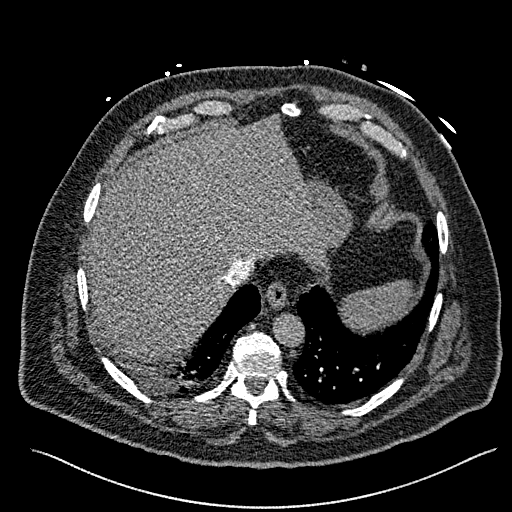
[im 87/273  lung]
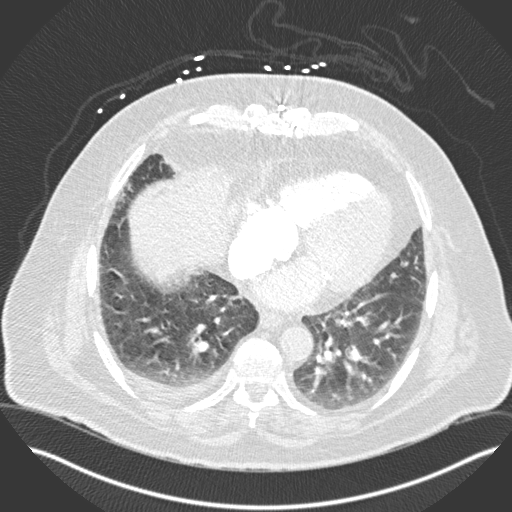
[im 99/273  soft-tissue]
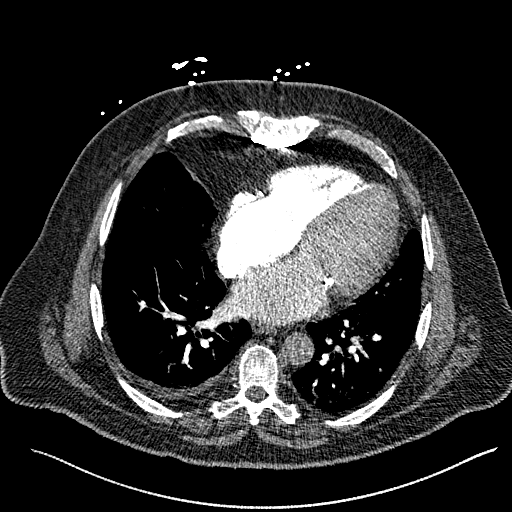
[im 124/273  lung]
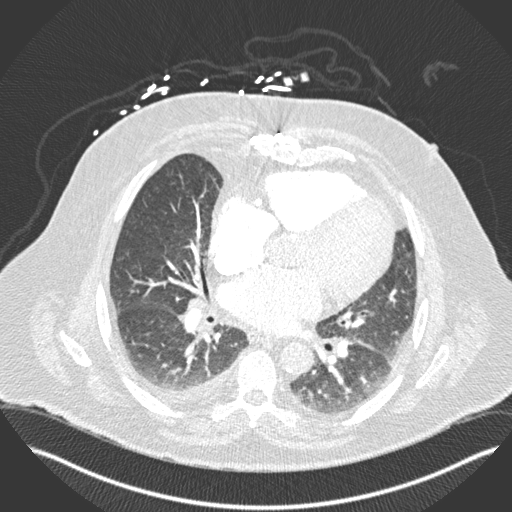
[im 137/273  soft-tissue]
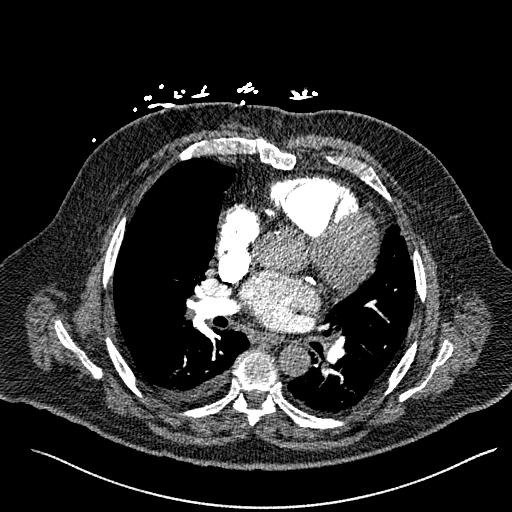
[im 149/273  lung]
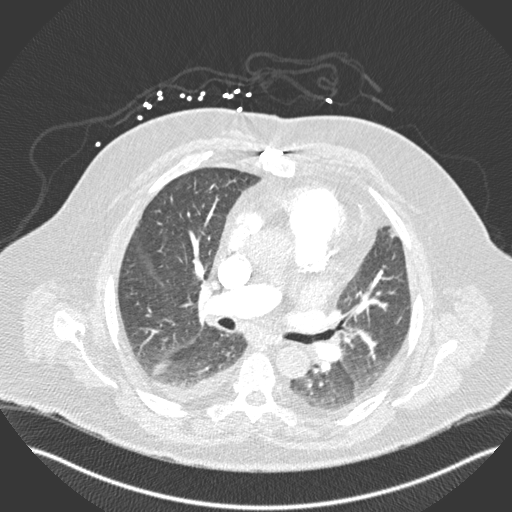
[im 174/273  soft-tissue]
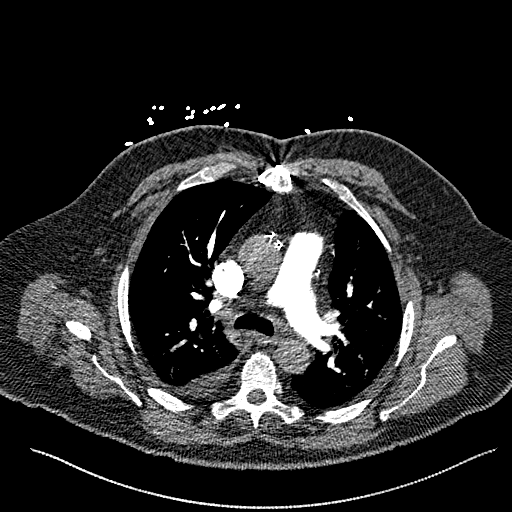
[im 186/273  lung]
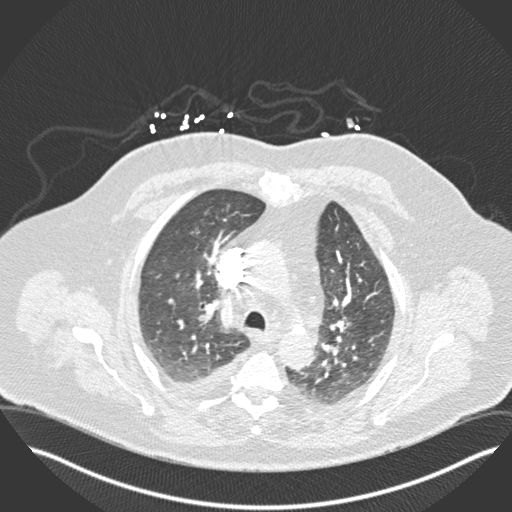
[im 211/273  soft-tissue]
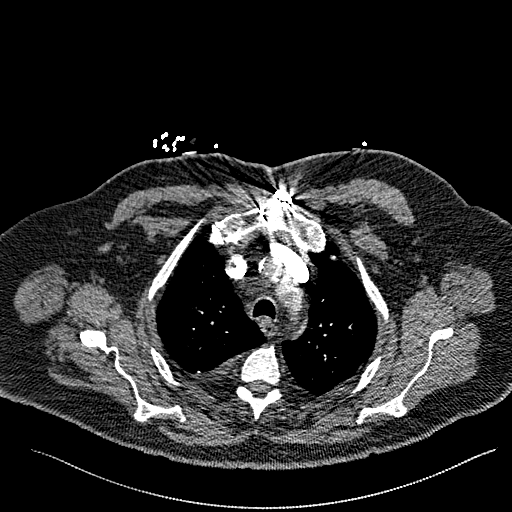
[im 223/273  lung]
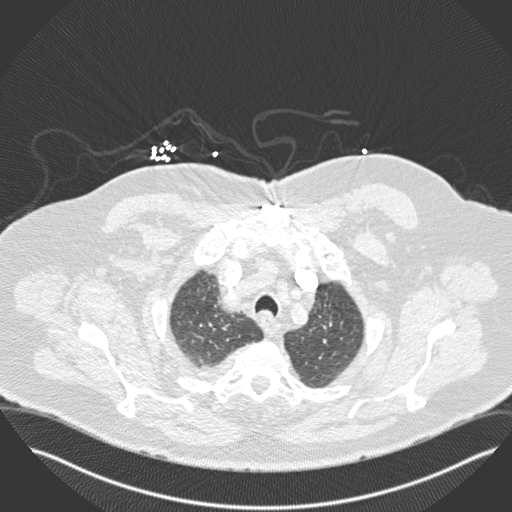
[im 235/273  soft-tissue]
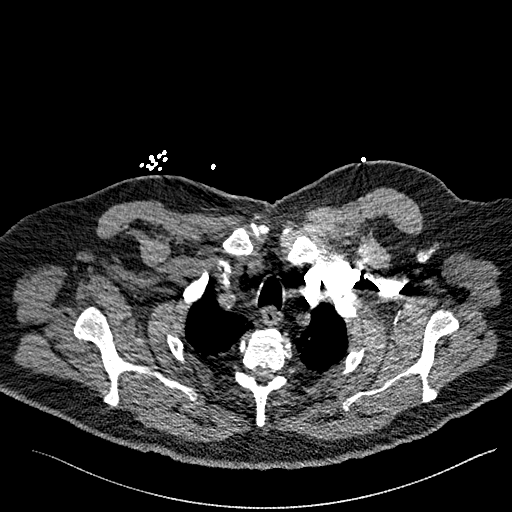
[im 260/273  lung]
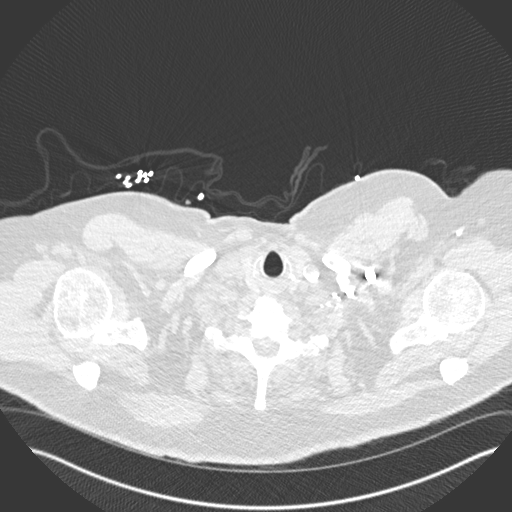

[Series 8: coronal mpr · coronal · 0.55mm/px · 3 of 167 slices shown]
[im 42/167  soft-tissue]
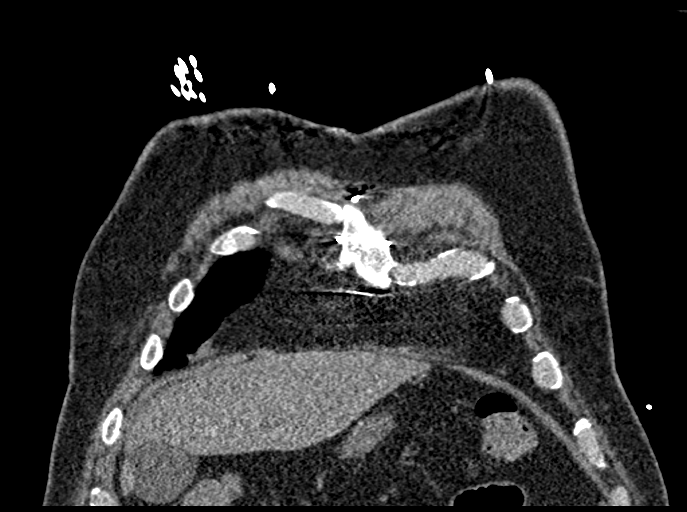
[im 84/167  soft-tissue]
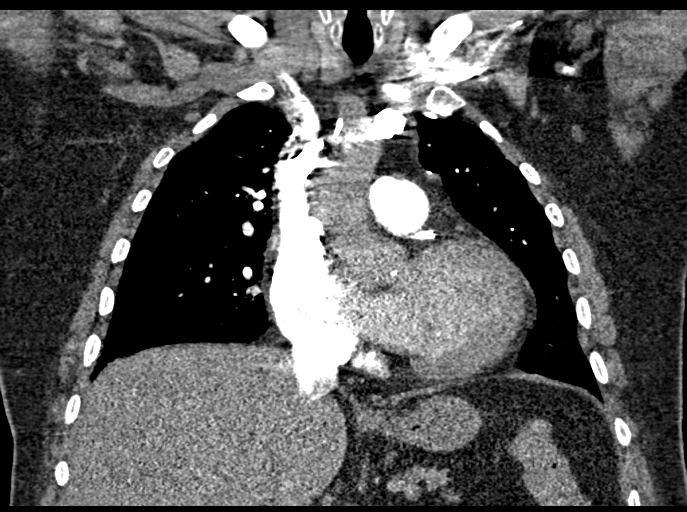
[im 125/167  soft-tissue]
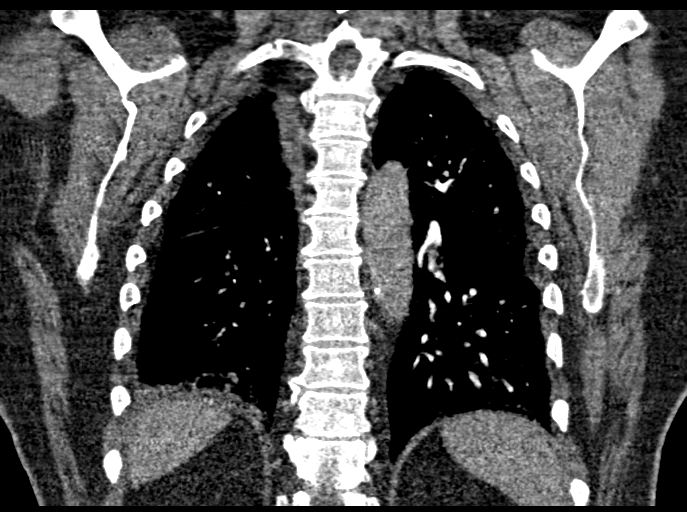

[18 of 46 positions shown; findings below may reference images not displayed]

FINDINGS: Cardiovascular:  Negative for pulmonary embolism.

Mild atherosclerotic disease in the thoracic aorta without aneurysm.
No significant opacification of the thoracic aorta.

CABG with extensive coronary calcification. Heart size mildly
enlarged. No pericardial effusion.

Mediastinum/Nodes: Negative for mass or adenopathy

Lungs/Pleura: Small right pleural effusion with minimal right lower
lobe atelectasis. Negative for infiltrate edema or mass.

Upper Abdomen: Negative

Musculoskeletal: No acute skeletal abnormality.

Review of the MIP images confirms the above findings.
IMPRESSION: Negative for pulmonary embolism.

Small right pleural effusion without infiltrate or edema.

Extensive coronary calcification.  Postop CABG.

Aortic Atherosclerosis ([id number].H).

## 2018-08-23 NOTE — Telephone Encounter (Signed)
Follow up   Patient is calling to obtain echocardiogram results. Please call,.

## 2018-08-31 NOTE — Progress Notes (Signed)
Dr. Harrington Challenger has reviewed with patient.

## 2018-08-31 NOTE — Telephone Encounter (Signed)
Dr. Harrington Challenger has reviewed with patient.

## 2018-09-12 ENCOUNTER — Telehealth: Payer: Self-pay | Admitting: Internal Medicine

## 2018-09-12 NOTE — Telephone Encounter (Signed)
Pt has gone out to the store.  Left message with patient's wife that I will forward to Dr. Harrington Challenger to advise on plan. She spoke with patient last, after his echo.

## 2018-09-12 NOTE — Telephone Encounter (Signed)
New message   Patient is following up on his afib he wants to know what the next steps are  He is having sob and thinks his heart is out of rhythm   Pt c/o Shortness Of Breath: STAT if SOB developed within the last 24 hours or pt is noticeably SOB on the phone  1. Are you currently SOB (can you hear that pt is SOB on the phone)? no  2. How long have you been experiencing SOB? For awhile  3. Are you SOB when sitting or when up moving around? both  4. Are you currently experiencing any other symptoms? He said his heart is out of rhythm and the shortness of breath never goes away , he thought you were ordering a stress test for him

## 2018-09-21 NOTE — Telephone Encounter (Signed)
Add pt in to see me in clinic on Monday  WIll get EKG  Check rate     He is getting amiodarone now which will help him convert Based on HR and EKG decide how much longer to load before doing DC Cardioversion again

## 2018-09-21 NOTE — Telephone Encounter (Signed)
Spoke with pt. Added on Mon at 1:20 pm. He wanted to let Dr. Harrington Challenger know he told her incorrect dose of amiodarone.  He told her he was taking 2 pills per day but his medicine list is correct.  He is taking one 200 mg tablet daily.

## 2018-09-26 ENCOUNTER — Encounter: Payer: Self-pay | Admitting: Internal Medicine

## 2018-09-26 ENCOUNTER — Ambulatory Visit: Payer: Medicare Other | Admitting: Internal Medicine

## 2018-09-26 ENCOUNTER — Encounter: Payer: Self-pay | Admitting: *Deleted

## 2018-09-26 VITALS — BP 134/66 | HR 69 | Ht 68.0 in | Wt 240.6 lb

## 2018-09-26 DIAGNOSIS — I480 Paroxysmal atrial fibrillation: Secondary | ICD-10-CM | POA: Diagnosis not present

## 2018-09-26 DIAGNOSIS — R0602 Shortness of breath: Secondary | ICD-10-CM | POA: Diagnosis not present

## 2018-09-26 DIAGNOSIS — I10 Essential (primary) hypertension: Secondary | ICD-10-CM | POA: Diagnosis not present

## 2018-09-26 DIAGNOSIS — I2510 Atherosclerotic heart disease of native coronary artery without angina pectoris: Secondary | ICD-10-CM | POA: Diagnosis not present

## 2018-09-26 NOTE — Progress Notes (Addendum)
Cardiology Office Note   Date:  09/26/2018   ID:  [name], DOB [birth year], MRN [id number]  PCP:  Cassandria Anger, MD  Cardiologist:   [name], MD   F/u of atrial fibrillaiton        History of Present Illness: [name] is a 83 y.o. male with a history ofCAD, (s/p CABG 01/2011 LIMA to LAD; SVG to Cx) Post op afib (Rx coumadin and amio for a period, diabetes, hypertension, nephrolithiasis, hyperlipidemia, postoperative atrial fibrillation.. .  The pat went to ED this summer with SOB and LE edema   Found to be in atrial fib   Followed in Afib clinic    Underwent cardioversion  Since then he was seen in ED in June with worsening SOB and LE edema  Found to be in atrial fibrillation    Referred to afib cilnic   Seen by Maximino Greenland in July  At end of July had cardioversion  Returned to Afib    Placed on amiodarone  Repeat cardioversion  Converted then reverted to atrial fib    He said he felt better for a short period after cardioversoin   On last visit I recomm stopping diltiazem   Continue metorprlol   Echo schedule   Difficult study   LVEF est at 40 to 45%   By my review it appeared greater      Since seen he continues to have SOB with activity   NO CP   No palpitations   He has been on amiodarone 200 daily     Current Meds  Medication Sig  . amiodarone (PACERONE) 200 MG tablet Take 1 tablet (200 mg total) by mouth daily.  Marland Kitchen apixaban (ELIQUIS) 5 MG TABS tablet Take 1 tablet (5 mg total) by mouth 2 (two) times daily.  Marland Kitchen aspirin 81 MG tablet Take 81 mg by mouth daily.   . cholecalciferol (VITAMIN D) 1000 units tablet Take 1,000 Units by mouth 2 (two) times daily.  Marland Kitchen gabapentin (NEURONTIN) 100 MG capsule TAKE 1 CAPSULE BY MOUTH THREE TIMES A DAY  . metFORMIN (GLUCOPHAGE) 500 MG tablet Take 1 tablet (500 mg total) by mouth 2 (two) times daily with a meal.  . metoprolol succinate (TOPROL XL) 25 MG 24 hr tablet Take 1 tablet (25 mg total) by mouth daily.  Marland Kitchen  Propylene Glycol (SYSTANE BALANCE) 0.6 % SOLN Place 1 drop into both eyes 2 (two) times daily as needed (for dry eyes).  . repaglinide (PRANDIN) 2 MG tablet Take 2 mg by mouth 3 (three) times daily before meals.  . simvastatin (ZOCOR) 10 MG tablet Take 10 mg by mouth daily.     Allergies:   Patient has no known allergies.   Past Medical History:  Diagnosis Date  . Arthritis   . Atrial fibrillation ([place])    post op; amiodarone and coumadin continued for 3 mos post op  . CARCINOMA, SKIN, SQUAMOUS CELL 01/07/2010  . Cataract    beginning of cataracts  . COLONIC POLYPS, HX OF 04/26/2007  . Coronary artery disease    s/p CABG 3/12: L-LAD, S-CFX (Dr. Roxy Manns); EF 45% at cath prior to CABG  . DIABETES MELLITUS, TYPE II 04/26/2007  . Hyperlipidemia   . HYPERTENSION 04/26/2007  . NEPHROLITHIASIS, HX OF 04/26/2007  . SBO (small bowel obstruction) ([place]) 10/2015   Partial   . Shortness of breath dyspnea    with exertion  . Unspecified hearing loss 12/18/2008    Past  Surgical History:  Procedure Laterality Date  . BACK SURGERY     disectomy  . CARDIOVERSION N/A 06/08/2018   Procedure: CARDIOVERSION;  Surgeon: Buford Dresser, MD;  Location: [facility] ENDOSCOPY;  Service: Cardiovascular;  Laterality: N/A;  . CARDIOVERSION N/A 07/25/2018   Procedure: CARDIOVERSION;  Surgeon: Fay Records, MD;  Location: [place];  Service: Cardiovascular;  Laterality: N/A;  . COLONOSCOPY    . CORONARY ARTERY BYPASS GRAFT  01/12/2011  . LUMBAR LAMINECTOMY/DECOMPRESSION MICRODISCECTOMY N/A 05/08/2015   Procedure: LUMBAR LAMINECTOMY/DECOMPRESSION MICRODISCECTOMY 1 LEVEL Lumbar four five;  Surgeon: Melina Schools, MD;  Location: [place];  Service: Orthopedics;  Laterality: N/A;  . TOTAL HIP ARTHROPLASTY Left    left on 04/15/07  . TYMPANIC MEMBRANE REPAIR  1981     Social History:  The patient  reports that he quit smoking about 37 years ago. He has never used smokeless tobacco. He reports that he does not drink  alcohol or use drugs.   Family History:  The patient's family history includes Breast cancer in his sister; Cancer in his brother and sister; Heart disease in his brother and father; Heart failure in his mother.    ROS:  Please see the history of present illness. All other systems are reviewed and  Negative to the above problem except as noted.    PHYSICAL EXAM: VS:  BP 134/66   Pulse 69   Ht 5\' 8"  (1.727 m)   Wt 240 lb 9.6 oz (109.1 kg)   SpO2 95%   BMI 36.58 kg/m   GEN:  Morbidly obese 83 yo in no acute distress  HEENT: normal  Neck: JVP is normal  No carotid bruits, or masses Cardiac:  Regular rate and rhytm; no murmurs, rubs, or gallops,  Trivial  edema  Respiratory:  clear to auscultation bilaterally, normal work of breathing GI: soft, nontender, nondistended, + BS  No hepatomegaly  MS: no deformity Moving all extremities   Skin: warm and dry, no rash Neuro:  Strength and sensation are intact Psych: euthymic mood, full affect   EKG:  EKG Is ordered today  SR 67 bpm   First degree AV block   PR 262 msec   Septal MI   Poss IWMI  Lipid Panel    Component Value Date/Time   CHOL 101 02/07/2018 1502   TRIG 117 02/07/2018 1502   TRIG 78 11/05/2006 1123   HDL 43 02/07/2018 1502   CHOLHDL 2.3 02/07/2018 1502   CHOLHDL 2.5 01/17/2016 1209   VLDL 20 01/17/2016 1209   LDLCALC 35 02/07/2018 1502      Wt Readings from Last 3 Encounters:  09/26/18 240 lb 9.6 oz (109.1 kg)  08/16/18 237 lb (107.5 kg)  08/08/18 239 lb (108.4 kg)      ASSESSMENT AND PLAN:  1 Atrial fibrillation  He is back in SR at least for now.   I would continue amiodarone   Continue anticoagulation.     2  Dyspnea.   NOt sure what Etiology is    LV funciton is mildly down on my review   I would recomm a lexiscan myovue to r/u large area of ischemia  2  CAD   Pt s/p CABG in 2012 Lexiscan myovue     3  HTN   BP is OK today    4  Morbid obesity   Pt admits that weight keeps going up   May indeed  explain SOB He plans to work on diet  Current medicines are reviewed at length with the patient today.  The patient does not have concerns regarding medicines.  Signed, [name], MD  09/26/2018 2:07 PM    [place], [address] [employer], [place]  [address] Phone: [phone]; Fax: [phone]

## 2018-09-26 NOTE — Patient Instructions (Addendum)
Medication Instructions:  No change If you need a refill on your cardiac medications before your next appointment, please call your pharmacy.   Lab work: none If you have labs (blood work) drawn today and your tests are completely normal, you will receive your results only by: Marland Kitchen MyChart Message (if you have MyChart) OR . A paper copy in the mail If you have any lab test that is abnormal or we need to change your treatment, we will call you to review the results.  Testing/Procedures: Your physician has requested that you have en exercise stress myoview. For further information please visit [email or web address]. Please follow instruction sheet, as given.   Follow-Up: Your physician recommends that you schedule a follow-up appointment in: early February, 2020 with Dr. Harrington Challenger.    Any Other Special Instructions Will Be Listed Below (If Applicable).

## 2018-10-03 DIAGNOSIS — M48061 Spinal stenosis, lumbar region without neurogenic claudication: Secondary | ICD-10-CM | POA: Insufficient documentation

## 2018-10-03 DIAGNOSIS — M545 Low back pain: Secondary | ICD-10-CM | POA: Diagnosis not present

## 2018-10-04 ENCOUNTER — Telehealth (HOSPITAL_COMMUNITY): Payer: Self-pay | Admitting: *Deleted

## 2018-10-04 ENCOUNTER — Other Ambulatory Visit: Payer: Self-pay | Admitting: Internal Medicine

## 2018-10-04 NOTE — Telephone Encounter (Signed)
Patient given detailed instructions per Myocardial Perfusion Study Information Sheet for the test on 10/10/18 at 1000. Patient notified to arrive 15 minutes early and that it is imperative to arrive on time for appointment to keep from having the test rescheduled.  If you need to cancel or reschedule your appointment, please call the office within 24 hours of your appointment. . Patient verbalized understanding.[name], Ranae Palms

## 2018-10-04 NOTE — Telephone Encounter (Signed)
Patient given detailed instructions per Myocardial Perfusion Study Information Sheet for the test on 10/10/18 at 1000. Patient notified to arrive 15 minutes early and that it is imperative to arrive on time for appointment to keep from having the test rescheduled.  If you need to cancel or reschedule your appointment, please call the office within 24 hours of your appointment. . Patient verbalized understanding.[name], [name]

## 2018-10-06 ENCOUNTER — Other Ambulatory Visit (HOSPITAL_COMMUNITY): Payer: Self-pay | Admitting: Nurse Practitioner

## 2018-10-10 ENCOUNTER — Ambulatory Visit (HOSPITAL_COMMUNITY): Payer: Medicare Other | Attending: Cardiology

## 2018-10-10 DIAGNOSIS — R0602 Shortness of breath: Secondary | ICD-10-CM | POA: Insufficient documentation

## 2018-10-10 MED ORDER — TECHNETIUM TC 99M TETROFOSMIN IV KIT
32.3000 | PACK | Freq: Once | INTRAVENOUS | Status: AC | PRN
  Administered 2018-10-10: 32.3 via INTRAVENOUS
  Filled 2018-10-10: qty 33

## 2018-10-10 MED ORDER — REGADENOSON 0.4 MG/5ML IV SOLN
0.4000 mg | Freq: Once | INTRAVENOUS | Status: AC
  Administered 2018-10-10: 0.4 mg via INTRAVENOUS

## 2018-10-10 MED ORDER — TECHNETIUM TC 99M TETROFOSMIN IV KIT
10.3000 | PACK | Freq: Once | INTRAVENOUS | Status: AC | PRN
  Administered 2018-10-10: 10.3 via INTRAVENOUS
  Filled 2018-10-10: qty 11

## 2018-10-11 LAB — MYOCARDIAL PERFUSION IMAGING
CHL CUP NUCLEAR SDS: 0
CHL CUP RESTING HR STRESS: 63 {beats}/min
LV dias vol: 88 mL (ref 62–150)
LV sys vol: 44 mL
Peak HR: 76 {beats}/min
SRS: 0
SSS: 0
TID: 1.09

## 2018-11-02 DIAGNOSIS — Z96652 Presence of left artificial knee joint: Secondary | ICD-10-CM | POA: Insufficient documentation

## 2018-11-17 ENCOUNTER — Telehealth: Payer: Self-pay | Admitting: *Deleted

## 2018-11-17 NOTE — Telephone Encounter (Signed)
LVM for patient to call back in regards to scheduling AWV with our health coach on 11/22/18 following his scheduled visit with PCP.

## 2018-11-21 ENCOUNTER — Telehealth: Payer: Self-pay | Admitting: Internal Medicine

## 2018-11-21 NOTE — Telephone Encounter (Signed)
Did not need this encounter °

## 2018-11-22 ENCOUNTER — Ambulatory Visit (INDEPENDENT_AMBULATORY_CARE_PROVIDER_SITE_OTHER): Payer: Medicare Other | Admitting: Internal Medicine

## 2018-11-22 ENCOUNTER — Encounter: Payer: Self-pay | Admitting: Internal Medicine

## 2018-11-22 DIAGNOSIS — E1159 Type 2 diabetes mellitus with other circulatory complications: Secondary | ICD-10-CM

## 2018-11-22 DIAGNOSIS — M48062 Spinal stenosis, lumbar region with neurogenic claudication: Secondary | ICD-10-CM | POA: Diagnosis not present

## 2018-11-22 DIAGNOSIS — E785 Hyperlipidemia, unspecified: Secondary | ICD-10-CM

## 2018-11-22 DIAGNOSIS — G8929 Other chronic pain: Secondary | ICD-10-CM

## 2018-11-22 DIAGNOSIS — M545 Low back pain, unspecified: Secondary | ICD-10-CM

## 2018-11-22 NOTE — Assessment & Plan Note (Signed)
C/o low CBG at times - mid-day. He has been taking Metformin 5 tabs a day for ?reason -- reduce to 500 mg tid

## 2018-11-22 NOTE — Progress Notes (Signed)
Subjective:  Patient ID: [name], male    DOB: [birth year]  Age: 84 y.o. MRN: [id number]  CC: No chief complaint on file.   HPI [name] presents for HTN, CAD, A fib, DM f/u  C/o low CBG at times - mid-day. Taking Metformin 5 tabs a day for ?reason  Outpatient Medications Prior to Visit  Medication Sig Dispense Refill  . amiodarone (PACERONE) 200 MG tablet Take 1 tablet (200 mg total) by mouth daily. 90 tablet 1  . aspirin 81 MG tablet Take 81 mg by mouth daily.     . cholecalciferol (VITAMIN D) 1000 units tablet Take 1,000 Units by mouth 2 (two) times daily.    Marland Kitchen ELIQUIS 5 MG TABS tablet TAKE 1 TABLET BY MOUTH TWICE A DAY 60 tablet 3  . gabapentin (NEURONTIN) 100 MG capsule TAKE 1 CAPSULE BY MOUTH THREE TIMES A DAY 270 capsule 1  . metFORMIN (GLUCOPHAGE) 500 MG tablet Take 1 tablet (500 mg total) by mouth 2 (two) times daily with a meal. 270 tablet 2  . metFORMIN (GLUCOPHAGE) 500 MG tablet TAKE 1 TABLET BY MOUTH 3 TIMES A DAY 270 tablet 3  . metoprolol succinate (TOPROL XL) 25 MG 24 hr tablet Take 1 tablet (25 mg total) by mouth daily. 90 tablet 3  . Propylene Glycol (SYSTANE BALANCE) 0.6 % SOLN Place 1 drop into both eyes 2 (two) times daily as needed (for dry eyes).    . repaglinide (PRANDIN) 2 MG tablet Take 2 mg by mouth 3 (three) times daily before meals.    . simvastatin (ZOCOR) 10 MG tablet Take 10 mg by mouth daily.     No facility-administered medications prior to visit.     ROS: Review of Systems  Constitutional: Negative for appetite change, fatigue and unexpected weight change.  HENT: Negative for congestion, nosebleeds, sneezing, sore throat and trouble swallowing.   Eyes: Negative for itching and visual disturbance.  Respiratory: Negative for cough.   Cardiovascular: Negative for chest pain, palpitations and leg swelling.  Gastrointestinal: Negative for abdominal distention, blood in stool, diarrhea and nausea.  Genitourinary: Negative for  frequency and hematuria.  Musculoskeletal: Positive for back pain. Negative for gait problem, joint swelling and neck pain.  Skin: Negative for rash.  Neurological: Negative for dizziness, tremors, speech difficulty and weakness.  Psychiatric/Behavioral: Negative for agitation, dysphoric mood and sleep disturbance. The patient is not nervous/anxious.     Objective:  BP 132/76 (BP Location: Left Arm, Patient Position: Sitting, Cuff Size: Large)   Pulse 69   Temp 97.9 F (36.6 C) (Oral)   Ht 5\' 8"  (1.727 m)   Wt 244 lb (110.7 kg)   SpO2 99%   BMI 37.10 kg/m   BP Readings from Last 3 Encounters:  11/22/18 132/76  09/26/18 134/66  08/16/18 138/82    Wt Readings from Last 3 Encounters:  11/22/18 244 lb (110.7 kg)  10/10/18 240 lb (108.9 kg)  09/26/18 240 lb 9.6 oz (109.1 kg)    Physical Exam Constitutional:      General: He is not in acute distress.    Appearance: He is well-developed.     Comments: NAD  Eyes:     Conjunctiva/sclera: Conjunctivae normal.     Pupils: Pupils are equal, round, and reactive to light.  Neck:     Musculoskeletal: Normal range of motion.     Thyroid: No thyromegaly.     Vascular: No JVD.  Cardiovascular:     Rate and  Rhythm: Normal rate and regular rhythm.     Heart sounds: Normal heart sounds. No murmur. No friction rub. No gallop.   Pulmonary:     Effort: Pulmonary effort is normal. No respiratory distress.     Breath sounds: Normal breath sounds. No wheezing or rales.  Chest:     Chest wall: No tenderness.  Abdominal:     General: Bowel sounds are normal. There is no distension.     Palpations: Abdomen is soft. There is no mass.     Tenderness: There is no abdominal tenderness. There is no guarding or rebound.  Musculoskeletal: Normal range of motion.        General: No tenderness.  Lymphadenopathy:     Cervical: No cervical adenopathy.  Skin:    General: Skin is warm and dry.     Findings: No rash.  Neurological:     Mental  Status: He is alert and oriented to person, place, and time.     Cranial Nerves: No cranial nerve deficit.     Motor: No abnormal muscle tone.     Coordination: Coordination normal.     Gait: Gait normal.     Deep Tendon Reflexes: Reflexes are normal and symmetric.  Psychiatric:        Behavior: Behavior normal.        Thought Content: Thought content normal.        Judgment: Judgment normal.   obese LS spine is stiff  Lab Results  Component Value Date   WBC 7.3 07/25/2018   HGB 13.9 07/25/2018   HCT 42.9 07/25/2018   PLT 240 07/25/2018   GLUCOSE 140 (H) 07/25/2018   CHOL 101 02/07/2018   TRIG 117 02/07/2018   HDL 43 02/07/2018   LDLCALC 35 02/07/2018   ALT 25 06/21/2018   AST 26 06/21/2018   NA 139 07/25/2018   K 4.1 07/25/2018   CL 106 07/25/2018   CREATININE 1.17 07/25/2018   BUN 14 07/25/2018   CO2 24 07/25/2018   TSH 2.05 07/02/2017   PSA 1.18 07/02/2017   INR 2.3 03/03/2012   HGBA1C 6.9 (H) 07/05/2018   MICROALBUR 0.7 04/04/2018    No results found.  Assessment & Plan:   There are no diagnoses linked to this encounter.   No orders of the defined types were placed in this encounter.    Follow-up: No follow-ups on file.  Walker Kehr, MD

## 2018-11-22 NOTE — Assessment & Plan Note (Signed)
Simvastatin 

## 2018-11-22 NOTE — Assessment & Plan Note (Signed)
F/u w/Dr Rolena Infante

## 2018-11-23 ENCOUNTER — Other Ambulatory Visit (INDEPENDENT_AMBULATORY_CARE_PROVIDER_SITE_OTHER): Payer: Medicare Other

## 2018-11-23 DIAGNOSIS — I10 Essential (primary) hypertension: Secondary | ICD-10-CM | POA: Diagnosis not present

## 2018-11-23 DIAGNOSIS — E1159 Type 2 diabetes mellitus with other circulatory complications: Secondary | ICD-10-CM

## 2018-11-23 LAB — BASIC METABOLIC PANEL
BUN: 15 mg/dL (ref 6–23)
CALCIUM: 9.8 mg/dL (ref 8.4–10.5)
CO2: 29 mEq/L (ref 19–32)
Chloride: 102 mEq/L (ref 96–112)
Creatinine, Ser: 1.22 mg/dL (ref 0.40–1.50)
GFR: 60.47 mL/min (ref >60.00)
Glucose, Bld: 160 mg/dL — ABNORMAL HIGH (ref 70–99)
Potassium: 4.2 mEq/L (ref 3.5–5.1)
SODIUM: 139 meq/L (ref 135–145)

## 2018-11-23 LAB — HEMOGLOBIN A1C: HEMOGLOBIN A1C: 6.5 % (ref 4.6–6.5)

## 2018-12-20 ENCOUNTER — Other Ambulatory Visit: Payer: Self-pay | Admitting: Internal Medicine

## 2018-12-29 ENCOUNTER — Encounter: Payer: Self-pay | Admitting: Internal Medicine

## 2018-12-29 ENCOUNTER — Ambulatory Visit: Payer: Medicare Other | Admitting: Internal Medicine

## 2018-12-29 VITALS — BP 148/66 | HR 69 | Ht 68.0 in | Wt 240.8 lb

## 2018-12-29 DIAGNOSIS — I480 Paroxysmal atrial fibrillation: Secondary | ICD-10-CM

## 2018-12-29 DIAGNOSIS — R2681 Unsteadiness on feet: Secondary | ICD-10-CM | POA: Diagnosis not present

## 2018-12-29 DIAGNOSIS — Z79899 Other long term (current) drug therapy: Secondary | ICD-10-CM | POA: Diagnosis not present

## 2018-12-29 DIAGNOSIS — E782 Mixed hyperlipidemia: Secondary | ICD-10-CM

## 2018-12-29 DIAGNOSIS — I10 Essential (primary) hypertension: Secondary | ICD-10-CM | POA: Diagnosis not present

## 2018-12-29 NOTE — Progress Notes (Signed)
Cardiology Office Note   Date:  12/29/2018   ID:  [name], DOB [birth year], MRN [id number]  PCP:  Cassandria Anger, MD  Cardiologist:   [name], MD   F/u of atrial fibrillaiton        History of Present Illness: [name] is a 84 y.o. male with a history ofCAD, (s/p CABG 01/2011 LIMA to LAD; SVG to Cx),diabetes, hypertension, nephrolithiasis, hyperlipidemia, and atrial fibrillaiton   He underwetn cardioversion last year    Failed   Underwent amiodarone load.   Converted   Went back into atrial fib   .. .  Repeate echo    LVEF est at 40 to 45%   By my review it appeared greater     Since seen he says it feels like heart is doing good   Still with SOB with activity   Myovue in Nov 2019 was normal     Current Meds  Medication Sig  . amiodarone (PACERONE) 200 MG tablet Take 1 tablet (200 mg total) by mouth daily.  Marland Kitchen aspirin 81 MG tablet Take 81 mg by mouth daily.   . cholecalciferol (VITAMIN D) 1000 units tablet Take 1,000 Units by mouth 2 (two) times daily.  Marland Kitchen ELIQUIS 5 MG TABS tablet TAKE 1 TABLET BY MOUTH TWICE A DAY  . gabapentin (NEURONTIN) 100 MG capsule TAKE 1 CAPSULE BY MOUTH THREE TIMES A DAY  . metFORMIN (GLUCOPHAGE) 500 MG tablet TAKE 1 TABLET BY MOUTH 3 TIMES A DAY  . metoprolol succinate (TOPROL XL) 25 MG 24 hr tablet Take 1 tablet (25 mg total) by mouth daily.  Marland Kitchen Propylene Glycol (SYSTANE BALANCE) 0.6 % SOLN Place 1 drop into both eyes 2 (two) times daily as needed (for dry eyes).  . repaglinide (PRANDIN) 2 MG tablet Take 2 mg by mouth 3 (three) times daily before meals.  . simvastatin (ZOCOR) 10 MG tablet Take 10 mg by mouth daily.     Allergies:   Patient has no known allergies.   Past Medical History:  Diagnosis Date  . Arthritis   . Atrial fibrillation ([place])    post op; amiodarone and coumadin continued for 3 mos post op  . CARCINOMA, SKIN, SQUAMOUS CELL 01/07/2010  . Cataract    beginning of cataracts  . COLONIC POLYPS, HX OF  04/26/2007  . Coronary artery disease    s/p CABG 3/12: L-LAD, S-CFX (Dr. Roxy Manns); EF 45% at cath prior to CABG  . DIABETES MELLITUS, TYPE II 04/26/2007  . Hyperlipidemia   . HYPERTENSION 04/26/2007  . NEPHROLITHIASIS, HX OF 04/26/2007  . SBO (small bowel obstruction) ([place]) 10/2015   Partial   . Shortness of breath dyspnea    with exertion  . Unspecified hearing loss 12/18/2008    Past Surgical History:  Procedure Laterality Date  . BACK SURGERY     disectomy  . CARDIOVERSION N/A 06/08/2018   Procedure: CARDIOVERSION;  Surgeon: Buford Dresser, MD;  Location: [facility] ENDOSCOPY;  Service: Cardiovascular;  Laterality: N/A;  . CARDIOVERSION N/A 07/25/2018   Procedure: CARDIOVERSION;  Surgeon: Fay Records, MD;  Location: [place];  Service: Cardiovascular;  Laterality: N/A;  . COLONOSCOPY    . CORONARY ARTERY BYPASS GRAFT  01/12/2011  . LUMBAR LAMINECTOMY/DECOMPRESSION MICRODISCECTOMY N/A 05/08/2015   Procedure: LUMBAR LAMINECTOMY/DECOMPRESSION MICRODISCECTOMY 1 LEVEL Lumbar four five;  Surgeon: Melina Schools, MD;  Location: [place];  Service: Orthopedics;  Laterality: N/A;  . TOTAL HIP ARTHROPLASTY Left    left on 04/15/07  .  TYMPANIC MEMBRANE REPAIR  1981     Social History:  The patient  reports that he quit smoking about 38 years ago. He has never used smokeless tobacco. He reports that he does not drink alcohol or use drugs.   Family History:  The patient's family history includes Breast cancer in his sister; Cancer in his brother and sister; Heart disease in his brother and father; Heart failure in his mother.    ROS:  Please see the history of present illness. All other systems are reviewed and  Negative to the above problem except as noted.    PHYSICAL EXAM: VS:  BP (!) 148/66   Pulse 69   Ht 5\' 8"  (1.727 m)   Wt 240 lb 12.8 oz (109.2 kg)   SpO2 92%   BMI 36.61 kg/m   GEN:  Morbidly obese 84 yo in no acute distress  HEENT: normal  Neck: JVP is not elevated  No carotid  bruits, or masses Cardiac:  Regular rate and rhytm; no murmurs, rubs, or gallops,  Trivial  edema  Respiratory:  clear to auscultation bilaterally, normal work of breathing GI: soft, nontender, nondistended, + BS  No hepatomegaly  MS: no deformity Moving all extremities   Skin: warm and dry, no rash Neuro:  Strength and sensation are intact Psych: euthymic mood, full affect   EKG:  EKG Is ordered today  Sinus bradycardia 65 bpm   First degree AV block.  Anteroseptal MI   Inferior MI    Lipid Panel    Component Value Date/Time   CHOL 101 02/07/2018 1502   TRIG 117 02/07/2018 1502   TRIG 78 11/05/2006 1123   HDL 43 02/07/2018 1502   CHOLHDL 2.3 02/07/2018 1502   CHOLHDL 2.5 01/17/2016 1209   VLDL 20 01/17/2016 1209   LDLCALC 35 02/07/2018 1502      Wt Readings from Last 3 Encounters:  12/29/18 240 lb 12.8 oz (109.2 kg)  11/22/18 244 lb (110.7 kg)  10/10/18 240 lb (108.9 kg)      ASSESSMENT AND PLAN:  1 Atrial fibrillation   SR today   Keep on same dose of amiodarone   Will check labs today     2  Dyspnea.  Volume status appear OK  Some may be deconditioning and wt    Will refer to [facility] to see if he can get walking more (unsteadyP  2  CAD   Pt s/p CABG in 2012 Myovue last fall perfusion is normal     I am not convinced of active angina   3  HTN   BP is fair   I would not change meds       4   HL  Check lpids today   LAst labs were from March 2019  5   Morbid obesity   Wt remains elevated     5  Unsteadiness  Will refer to balance rehab    Current medicines are reviewed at length with the patient today.  The patient does not have concerns regarding medicines.  Signed, [name], MD  12/29/2018 11:06 AM    [facility], [place], [place]  [address] Phone: [phone]; Fax: [phone]

## 2018-12-29 NOTE — Patient Instructions (Signed)
Medication Instructions:  The current medical regimen is effective;  continue present plan and medications.  If you need a refill on your cardiac medications before your next appointment, please call your pharmacy.   Lab work: Please have blood work today. (Lipid, TSH, CBC and CMP) If you have labs (blood work) drawn today and your tests are completely normal, you will receive your results only by: Marland Kitchen MyChart Message (if you have MyChart) OR . A paper copy in the mail If you have any lab test that is abnormal or we need to change your treatment, we will call you to review the results.  You are being referred to physical therapy for balance therapy.  Follow-Up: At [facility], you and your health needs are our priority.  As part of our continuing mission to provide you with exceptional heart care, we have created designated Provider Care Teams.  These Care Teams include your primary Cardiologist (physician) and Advanced Practice Providers (APPs -  Physician Assistants and Nurse Practitioners) who all work together to provide you with the care you need, when you need it. You will need a follow up appointment in:  6 months.  Please call our office 2 months in advance to schedule this appointment.  You may see [name], MD or one of the following Advanced Practice Providers on your designated Care Team: Richardson Dopp, PA-C [place], [place] . Daune Perch, NP  Thank you for choosing [facility]!!

## 2018-12-30 LAB — COMPREHENSIVE METABOLIC PANEL
A/G RATIO: 1.6 (ref 1.2–2.2)
ALT: 25 IU/L (ref 0–44)
AST: 20 IU/L (ref 0–40)
Albumin: 4.1 g/dL (ref 3.6–4.6)
Alkaline Phosphatase: 43 IU/L (ref 39–117)
BILIRUBIN TOTAL: 0.4 mg/dL (ref 0.0–1.2)
BUN/Creatinine Ratio: 14 (ref 10–24)
BUN: 16 mg/dL (ref 8–27)
CALCIUM: 9.6 mg/dL (ref 8.6–10.2)
CHLORIDE: 103 mmol/L (ref 96–106)
CO2: 23 mmol/L (ref 20–29)
Creatinine, Ser: 1.11 mg/dL (ref 0.76–1.27)
GFR calc non Af Amer: 62 mL/min/{1.73_m2} (ref >59)
GFR, EST AFRICAN AMERICAN: 72 mL/min/{1.73_m2} (ref >59)
Globulin, Total: 2.5 g/dL (ref 1.5–4.5)
Glucose: 119 mg/dL — ABNORMAL HIGH (ref 65–99)
POTASSIUM: 4.3 mmol/L (ref 3.5–5.2)
Sodium: 142 mmol/L (ref 134–144)
TOTAL PROTEIN: 6.6 g/dL (ref 6.0–8.5)

## 2018-12-30 LAB — LIPID PANEL
Chol/HDL Ratio: 2.3 ratio (ref 0.0–5.0)
Cholesterol, Total: 118 mg/dL (ref 100–199)
HDL: 51 mg/dL (ref >39)
LDL Calculated: 47 mg/dL (ref 0–99)
Triglycerides: 102 mg/dL (ref 0–149)
VLDL Cholesterol Cal: 20 mg/dL (ref 5–40)

## 2018-12-30 LAB — CBC
Hematocrit: 41.1 % (ref 37.5–51.0)
Hemoglobin: 13.9 g/dL (ref 13.0–17.7)
MCH: 32 pg (ref 26.6–33.0)
MCHC: 33.8 g/dL (ref 31.5–35.7)
MCV: 95 fL (ref 79–97)
PLATELETS: 240 10*3/uL (ref 150–450)
RBC: 4.34 x10E6/uL (ref 4.14–5.80)
RDW: 13.1 % (ref 11.6–15.4)
WBC: 6.9 10*3/uL (ref 3.4–10.8)

## 2018-12-30 LAB — TSH: TSH: 2.64 u[IU]/mL (ref 0.450–4.500)

## 2019-01-04 ENCOUNTER — Other Ambulatory Visit: Payer: Self-pay | Admitting: Internal Medicine

## 2019-01-04 MED ORDER — SIMVASTATIN 10 MG PO TABS: 10.0000 mg | ORAL_TABLET | Freq: Every day | ORAL | 3 refills | Status: DC

## 2019-01-16 DIAGNOSIS — H401121 Primary open-angle glaucoma, left eye, mild stage: Secondary | ICD-10-CM | POA: Diagnosis not present

## 2019-01-16 DIAGNOSIS — E119 Type 2 diabetes mellitus without complications: Secondary | ICD-10-CM | POA: Diagnosis not present

## 2019-01-16 DIAGNOSIS — H40021 Open angle with borderline findings, high risk, right eye: Secondary | ICD-10-CM | POA: Diagnosis not present

## 2019-01-16 DIAGNOSIS — H353132 Nonexudative age-related macular degeneration, bilateral, intermediate dry stage: Secondary | ICD-10-CM | POA: Diagnosis not present

## 2019-01-16 LAB — HM DIABETES EYE EXAM

## 2019-01-19 ENCOUNTER — Other Ambulatory Visit: Payer: Self-pay | Admitting: Internal Medicine

## 2019-01-19 ENCOUNTER — Other Ambulatory Visit (HOSPITAL_COMMUNITY): Payer: Self-pay | Admitting: Nurse Practitioner

## 2019-01-23 ENCOUNTER — Encounter: Payer: Self-pay | Admitting: Internal Medicine

## 2019-01-28 ENCOUNTER — Encounter (HOSPITAL_COMMUNITY): Payer: Self-pay

## 2019-01-28 ENCOUNTER — Other Ambulatory Visit: Payer: Self-pay

## 2019-01-28 DIAGNOSIS — Z7901 Long term (current) use of anticoagulants: Secondary | ICD-10-CM | POA: Insufficient documentation

## 2019-01-28 DIAGNOSIS — Z7982 Long term (current) use of aspirin: Secondary | ICD-10-CM | POA: Insufficient documentation

## 2019-01-28 DIAGNOSIS — I2510 Atherosclerotic heart disease of native coronary artery without angina pectoris: Secondary | ICD-10-CM | POA: Diagnosis not present

## 2019-01-28 DIAGNOSIS — Z96642 Presence of left artificial hip joint: Secondary | ICD-10-CM | POA: Diagnosis not present

## 2019-01-28 DIAGNOSIS — R1084 Generalized abdominal pain: Secondary | ICD-10-CM | POA: Insufficient documentation

## 2019-01-28 DIAGNOSIS — I10 Essential (primary) hypertension: Secondary | ICD-10-CM | POA: Insufficient documentation

## 2019-01-28 DIAGNOSIS — Z7984 Long term (current) use of oral hypoglycemic drugs: Secondary | ICD-10-CM | POA: Insufficient documentation

## 2019-01-28 DIAGNOSIS — E119 Type 2 diabetes mellitus without complications: Secondary | ICD-10-CM | POA: Insufficient documentation

## 2019-01-28 DIAGNOSIS — Z87891 Personal history of nicotine dependence: Secondary | ICD-10-CM | POA: Insufficient documentation

## 2019-01-28 DIAGNOSIS — R110 Nausea: Secondary | ICD-10-CM | POA: Diagnosis not present

## 2019-01-28 DIAGNOSIS — R109 Unspecified abdominal pain: Secondary | ICD-10-CM | POA: Diagnosis not present

## 2019-01-28 DIAGNOSIS — Z79899 Other long term (current) drug therapy: Secondary | ICD-10-CM | POA: Insufficient documentation

## 2019-01-28 LAB — LIPASE, BLOOD: Lipase: 30 U/L (ref 11–51)

## 2019-01-28 LAB — COMPREHENSIVE METABOLIC PANEL
ALT: 30 U/L (ref 0–44)
AST: 29 U/L (ref 15–41)
Albumin: 4 g/dL (ref 3.5–5.0)
Alkaline Phosphatase: 41 U/L (ref 38–126)
Anion gap: 10 (ref 5–15)
BUN: 16 mg/dL (ref 8–23)
CO2: 24 mmol/L (ref 22–32)
Calcium: 9 mg/dL (ref 8.9–10.3)
Chloride: 105 mmol/L (ref 98–111)
Creatinine, Ser: 1.04 mg/dL (ref 0.61–1.24)
GFR calc Af Amer: >60 mL/min (ref >60)
GFR calc non Af Amer: >60 mL/min (ref >60)
Glucose, Bld: 177 mg/dL — ABNORMAL HIGH (ref 70–99)
Potassium: 3.9 mmol/L (ref 3.5–5.1)
SODIUM: 139 mmol/L (ref 135–145)
Total Bilirubin: 0.7 mg/dL (ref 0.3–1.2)
Total Protein: 7.4 g/dL (ref 6.5–8.1)

## 2019-01-28 LAB — CBC
HCT: 44.9 % (ref 39.0–52.0)
Hemoglobin: 14.6 g/dL (ref 13.0–17.0)
MCH: 31.5 pg (ref 26.0–34.0)
MCHC: 32.5 g/dL (ref 30.0–36.0)
MCV: 96.8 fL (ref 80.0–100.0)
Platelets: 247 10*3/uL (ref 150–400)
RBC: 4.64 MIL/uL (ref 4.22–5.81)
RDW: 12.4 % (ref 11.5–15.5)
WBC: 7.9 10*3/uL (ref 4.0–10.5)
nRBC: 0 % (ref 0.0–0.2)

## 2019-01-28 MED ORDER — SODIUM CHLORIDE 0.9% FLUSH
3.0000 mL | Freq: Once | INTRAVENOUS | Status: AC
  Administered 2019-01-29: 3 mL via INTRAVENOUS

## 2019-01-28 NOTE — ED Triage Notes (Signed)
Abdominal pain near umbilical area since 3312 am today with nausea voiced no dysuria or diarrhea voiced.

## 2019-01-29 ENCOUNTER — Emergency Department (HOSPITAL_COMMUNITY): Payer: Medicare Other

## 2019-01-29 ENCOUNTER — Encounter (HOSPITAL_COMMUNITY): Payer: Self-pay

## 2019-01-29 ENCOUNTER — Emergency Department (HOSPITAL_COMMUNITY)
Admission: EM | Admit: 2019-01-29 | Discharge: 2019-01-29 | Disposition: A | Discharge status: Home / Self Care | Payer: Medicare Other | Attending: Emergency Medicine | Admitting: Emergency Medicine

## 2019-01-29 DIAGNOSIS — R1084 Generalized abdominal pain: Secondary | ICD-10-CM

## 2019-01-29 DIAGNOSIS — R109 Unspecified abdominal pain: Secondary | ICD-10-CM | POA: Diagnosis not present

## 2019-01-29 LAB — URINALYSIS, ROUTINE W REFLEX MICROSCOPIC
BACTERIA UA: NONE SEEN
Bilirubin Urine: NEGATIVE
Glucose, UA: 50 mg/dL — AB
Ketones, ur: 5 mg/dL — AB
Nitrite: NEGATIVE
Protein, ur: 30 mg/dL — AB
Specific Gravity, Urine: 1.02 (ref 1.005–1.030)
pH: 7 (ref 5.0–8.0)

## 2019-01-29 MED ORDER — DICYCLOMINE HCL 10 MG PO CAPS
20.0000 mg | ORAL_CAPSULE | Freq: Once | ORAL | Status: AC
  Administered 2019-01-29: 20 mg via ORAL
  Filled 2019-01-29: qty 2

## 2019-01-29 MED ORDER — MORPHINE SULFATE (PF) 4 MG/ML IV SOLN
4.0000 mg | Freq: Once | INTRAVENOUS | Status: AC
  Administered 2019-01-29: 4 mg via INTRAVENOUS
  Filled 2019-01-29: qty 1

## 2019-01-29 MED ORDER — IOPAMIDOL (ISOVUE-300) INJECTION 61%
100.0000 mL | Freq: Once | INTRAVENOUS | Status: AC | PRN
  Administered 2019-01-29: 100 mL via INTRAVENOUS

## 2019-01-29 MED ORDER — OMEPRAZOLE 20 MG PO CPDR: 20.0000 mg | DELAYED_RELEASE_CAPSULE | Freq: Every day | ORAL | 0 refills | Status: DC

## 2019-01-29 MED ORDER — ONDANSETRON HCL 4 MG/2ML IJ SOLN
4.0000 mg | Freq: Once | INTRAMUSCULAR | Status: AC
  Administered 2019-01-29: 4 mg via INTRAVENOUS
  Filled 2019-01-29: qty 2

## 2019-01-29 MED ORDER — SODIUM CHLORIDE (PF) 0.9 % IJ SOLN
INTRAMUSCULAR | Status: AC
  Filled 2019-01-29: qty 50

## 2019-01-29 MED ORDER — IOPAMIDOL (ISOVUE-300) INJECTION 61%
INTRAVENOUS | Status: AC
  Filled 2019-01-29: qty 100

## 2019-01-29 MED ORDER — DICYCLOMINE HCL 20 MG PO TABS: 20.0000 mg | ORAL_TABLET | Freq: Three times a day (TID) | ORAL | 0 refills | Status: DC

## 2019-01-29 MED ORDER — SODIUM CHLORIDE 0.9 % IV BOLUS
1000.0000 mL | Freq: Once | INTRAVENOUS | Status: AC
  Administered 2019-01-29: 1000 mL via INTRAVENOUS

## 2019-01-29 NOTE — ED Provider Notes (Signed)
Patient seen/examined in the Emergency Department in conjunction with Advanced Practice Provider  Patient reports abdominal pain Exam : periumbilical tenderness noted, abdominal distention Plan: imaging pending at this time    Ripley Fraise, MD 01/29/19 0147

## 2019-01-29 NOTE — ED Provider Notes (Signed)
ED ECG REPORT   Date: 01/29/2019 2:11am  Rate: 64  Rhythm: normal sinus rhythm  QRS Axis: normal  Intervals: normal  ST/T Wave abnormalities: nonspecific ST changes  Conduction Disutrbances:none  Narrative Interpretation:    I have personally reviewed the EKG tracing and agree with the computerized printout as noted.    Ripley Fraise, MD 01/29/19 [phone]

## 2019-01-29 NOTE — ED Notes (Signed)
Ct

## 2019-01-29 NOTE — Discharge Instructions (Signed)
Today you received medications that may make you sleepy or impair your ability to make decisions.  For the next 24 hours please do not drive, operate heavy machinery, care for a small child with out another adult present, or perform any activities that may cause harm to you or someone else if you were to fall asleep or be impaired.   Please do not take your metformin for the next 3 days.  This is to help decrease the risk of kidney harm from the combination of metformin and the IV dye that you got during your CT scan.  Please make sure that you are staying well-hydrated.

## 2019-01-29 NOTE — ED Provider Notes (Signed)
[facility] DEPT Provider Note   CSN: [id number] Arrival date & time: 01/28/19  2200    History   Chief Complaint Chief Complaint  Patient presents with   Abdominal Pain    HPI [name] is a 84 y.o. male with a past medical history of A. Fib, hypertension, DM 2, partial small bowel obstruction, who presents today for evaluation of abdominal pain.  He reports that when he went to bed last night his stomach felt "a little off" however was not significantly bothering him.  Throughout the day his abdomen has become more painful.  He denies any bloating.  He reports that he had a small bowel movement this morning with no diarrhea.  He says that he has been nauseous however has not been vomiting.  He denies any recent trauma.  No chest pain or shortness of breath.  No fevers.  He denies any flank pain, dysuria, increased frequency or urgency.  He states that he has not been able to take his medicines, including his blood pressure medicines, as he felt unwell.     HPI  Past Medical History:  Diagnosis Date   Arthritis    Atrial fibrillation ([place])    post op; amiodarone and coumadin continued for 3 mos post op   CARCINOMA, SKIN, SQUAMOUS CELL 01/07/2010   Cataract    beginning of cataracts   COLONIC POLYPS, HX OF 04/26/2007   Coronary artery disease    s/p CABG 3/12: L-LAD, S-CFX (Dr. Roxy Manns); EF 45% at cath prior to CABG   DIABETES MELLITUS, TYPE II 04/26/2007   Hyperlipidemia    HYPERTENSION 04/26/2007   NEPHROLITHIASIS, HX OF 04/26/2007   SBO (small bowel obstruction) ([place]) 10/2015   Partial    Shortness of breath dyspnea    with exertion   Unspecified hearing loss 12/18/2008    Patient Active Problem List   Diagnosis Date Noted   History of total knee replacement, left 11/02/2018   Spinal stenosis of lumbar region 10/03/2018   Diabetic neuropathy ([place]) 07/05/2018   RUQ abdominal pain 04/04/2018   Vertigo 04/04/2018    Well adult exam 07/02/2017   Abdominal pain 07/02/2017   Constipation 02/18/2016   Small bowel obstruction ([place]) 11/06/2015   Back pain 05/08/2015   Cough 10/17/2014   Dyslipidemia 02/09/2011   CAD (coronary artery disease), native coronary artery 02/09/2011   Afib ([place]) 01/20/2011   CARCINOMA, SKIN, SQUAMOUS CELL 01/07/2010   UNSPECIFIED HEARING LOSS 12/18/2008   Diabetes mellitus, type 2 ([place]) 04/26/2007   Essential hypertension 04/26/2007   COLONIC POLYPS, HX OF 04/26/2007   NEPHROLITHIASIS, HX OF 04/26/2007    Past Surgical History:  Procedure Laterality Date   BACK SURGERY     disectomy   CARDIOVERSION N/A 06/08/2018   Procedure: CARDIOVERSION;  Surgeon: Buford Dresser, MD;  Location: [facility] ENDOSCOPY;  Service: Cardiovascular;  Laterality: N/A;   CARDIOVERSION N/A 07/25/2018   Procedure: CARDIOVERSION;  Surgeon: Fay Records, MD;  Location: [facility];  Service: Cardiovascular;  Laterality: N/A;   COLONOSCOPY     CORONARY ARTERY BYPASS GRAFT  01/12/2011   LUMBAR LAMINECTOMY/DECOMPRESSION MICRODISCECTOMY N/A 05/08/2015   Procedure: LUMBAR LAMINECTOMY/DECOMPRESSION MICRODISCECTOMY 1 LEVEL Lumbar four five;  Surgeon: Melina Schools, MD;  Location: [facility];  Service: Orthopedics;  Laterality: N/A;   TOTAL HIP ARTHROPLASTY Left    left on 04/15/07   [facility] Medications    Prior to Admission  medications   Medication Sig Start Date End Date Taking? Authorizing Provider  amiodarone (PACERONE) 200 MG tablet TAKE 1 TABLET BY MOUTH EVERY DAY 01/19/19   Sherran Needs, NP  aspirin 81 MG tablet Take 81 mg by mouth daily.     [provider]  cholecalciferol (VITAMIN D) 1000 units tablet Take 1,000 Units by mouth 2 (two) times daily.    [provider]  dicyclomine (BENTYL) 20 MG tablet Take 1 tablet (20 mg total) by mouth 4 (four) times daily -  before meals and at bedtime. 01/29/19   Lorin Glass,  PA-C  ELIQUIS 5 MG TABS tablet TAKE 1 TABLET BY MOUTH TWICE A DAY 10/07/18   Sherran Needs, NP  gabapentin (NEURONTIN) 100 MG capsule TAKE 1 CAPSULE BY MOUTH THREE TIMES A DAY 01/19/19   [name], Evie Lacks, MD  metFORMIN (GLUCOPHAGE) 500 MG tablet TAKE 1 TABLET BY MOUTH 3 TIMES A DAY 10/04/18   [name], Evie Lacks, MD  metoprolol succinate (TOPROL XL) 25 MG 24 hr tablet Take 1 tablet (25 mg total) by mouth daily. 08/08/18   Fay Records, MD  omeprazole (PRILOSEC) 20 MG capsule Take 1 capsule (20 mg total) by mouth daily for 14 days. 01/29/19 02/12/19  Lorin Glass, PA-C  Propylene Glycol (SYSTANE BALANCE) 0.6 % SOLN Place 1 drop into both eyes 2 (two) times daily as needed (for dry eyes).    [provider]  repaglinide (PRANDIN) 2 MG tablet Take 2 mg by mouth 3 (three) times daily before meals.    [provider]  simvastatin (ZOCOR) 10 MG tablet Take 1 tablet (10 mg total) by mouth daily. 01/04/19   Fay Records, MD    Family History Family History  Problem Relation Age of Onset   Heart failure Mother    Heart disease Father    Cancer Sister    Cancer Brother    Heart disease Brother    Breast cancer Sister    Colon cancer Neg Hx     Social History Social History   Tobacco Use   Smoking status: Former Smoker    Last attempt to quit: 01/02/1981    Years since quitting: 38.0   Smokeless tobacco: Never Used  Substance Use Topics   Alcohol use: No    Alcohol/week: 0.0 standard drinks   Drug use: No     Allergies   Patient has no known allergies.   Review of Systems Review of Systems  Constitutional: Negative for chills and fever.  Respiratory: Negative for chest tightness and shortness of breath.   Cardiovascular: Negative for chest pain.  Gastrointestinal: Positive for abdominal pain and nausea. Negative for abdominal distention, blood in stool, constipation, diarrhea and vomiting.  Genitourinary: Negative for dysuria and frequency.   Musculoskeletal: Negative for back pain and neck pain.  Neurological: Negative for weakness and headaches.  All other systems reviewed and are negative.    Physical Exam Updated Vital Signs BP (!) 190/96    Pulse 68    Temp 98.2 F (36.8 C) (Oral)    Resp 18    Ht 5\' 8"  (1.727 m)    Wt 102.1 kg    SpO2 97%    BMI 34.21 kg/m   Physical Exam Vitals signs and nursing note reviewed.  Constitutional:      General: He is not in acute distress.    Appearance: He is well-developed.  HENT:     Head: Normocephalic and atraumatic.  Mouth/Throat:     Mouth: Mucous membranes are moist.  Eyes:     Conjunctiva/sclera: Conjunctivae normal.  Neck:     Musculoskeletal: Neck supple.  Cardiovascular:     Rate and Rhythm: Normal rate and regular rhythm.     Heart sounds: Normal heart sounds. No murmur. No friction rub.  Pulmonary:     Effort: Pulmonary effort is normal. No respiratory distress.     Breath sounds: Normal breath sounds.  Abdominal:     General: Abdomen is protuberant. Bowel sounds are increased. There is no distension.     Palpations: Abdomen is soft.     Tenderness: There is abdominal tenderness in the periumbilical area. There is no guarding or rebound.  Skin:    General: Skin is warm and dry.  Neurological:     General: No focal deficit present.     Mental Status: He is alert and oriented to person, place, and time.  Psychiatric:        Mood and Affect: Mood normal.        Behavior: Behavior normal.      ED Treatments / Results  Labs (all labs ordered are listed, but only abnormal results are displayed) Labs Reviewed  COMPREHENSIVE METABOLIC PANEL - Abnormal; Notable for the following components:      Result Value   Glucose, Bld 177 (*)    All other components within normal limits  URINALYSIS, ROUTINE W REFLEX MICROSCOPIC - Abnormal; Notable for the following components:   Glucose, UA 50 (*)    Hgb urine dipstick SMALL (*)    Ketones, ur 5 (*)    Protein,  ur 30 (*)    Leukocytes,Ua TRACE (*)    All other components within normal limits  LIPASE, BLOOD  CBC    EKG None  Radiology Ct Abdomen Pelvis W Contrast  Result Date: 01/29/2019 CLINICAL DATA:  Abdominal pain and nausea EXAM: CT ABDOMEN AND PELVIS WITH CONTRAST TECHNIQUE: Multidetector CT imaging of the abdomen and pelvis was performed using the standard protocol following bolus administration of intravenous contrast. CONTRAST:  146mL ISOVUE-300 IOPAMIDOL (ISOVUE-300) INJECTION 61% COMPARISON:  CT abdomen pelvis 06/21/2018 FINDINGS: LOWER CHEST: There is no basilar pleural or apical pericardial effusion. HEPATOBILIARY: The hepatic contours and density are normal. There is no intra- or extrahepatic biliary dilatation. The gallbladder is normal. PANCREAS: The pancreatic parenchymal contours are normal and there is no ductal dilatation. There is no peripancreatic fluid collection. SPLEEN: Normal. ADRENALS/URINARY TRACT: --Adrenal glands: Normal. --Right kidney/ureter: No hydronephrosis, nephroureterolithiasis, perinephric stranding or solid renal mass. --Left kidney/ureter: No hydronephrosis, nephroureterolithiasis, perinephric stranding or solid renal mass. --Urinary bladder: Normal for degree of distention STOMACH/BOWEL: --Stomach/Duodenum: There is no hiatal hernia or other gastric abnormality. The duodenal course and caliber are normal. --Small bowel: No dilatation or inflammation. --Colon: No focal abnormality. --Appendix: Normal. VASCULAR/LYMPHATIC: There is aortic atherosclerosis without hemodynamically significant stenosis. The portal vein, splenic vein, superior mesenteric vein and IVC are patent. No abdominal or pelvic lymphadenopathy. REPRODUCTIVE: There are calcifications within the normal-sized prostate. Symmetric seminal vesicles. MUSCULOSKELETAL. Left total hip arthroplasty. Multilevel degenerative disc disease and lower lumbar facet arthrosis. Grade 1 L3-4 retrolisthesis and L4-5  anterolisthesis. OTHER: None. IMPRESSION: No acute abnormality of the abdomen or pelvis. Electronically Signed   By: Ulyses Jarred M.D.   On: 01/29/2019 01:58    Procedures Procedures (including critical care time)  Medications Ordered in ED Medications  iopamidol (ISOVUE-300) 61 % injection (has no administration in time range)  sodium  chloride (PF) 0.9 % injection (has no administration in time range)  sodium chloride flush (NS) 0.9 % injection 3 mL (3 mLs Intravenous Given 01/29/19 0117)  morphine 4 MG/ML injection 4 mg (4 mg Intravenous Given 01/29/19 0118)  ondansetron (ZOFRAN) injection 4 mg (4 mg Intravenous Given 01/29/19 0118)  sodium chloride 0.9 % bolus 1,000 mL (0 mLs Intravenous Stopped 01/29/19 0235)  iopamidol (ISOVUE-300) 61 % injection 100 mL (100 mLs Intravenous Contrast Given 01/29/19 0134)  dicyclomine (BENTYL) capsule 20 mg (20 mg Oral Given 01/29/19 0430)     Initial Impression / Assessment and Plan / ED Course  I have reviewed the triage vital signs and the nursing notes.  Pertinent labs & imaging results that were available during my care of the patient were reviewed by me and considered in my medical decision making (see chart for details).  Clinical Course as of Jan 28 430  Sun Jan 29, 2019  0309 Patient reevaluated, he says that he feels better overall.  He is attempting p.o. challenge.   [EH]    Clinical Course User Index [EH] Lorin Glass, PA-C      Patient is nontoxic, nonseptic appearing, in no apparent distress.  Patient's pain and other symptoms adequately managed in emergency department.  Fluid bolus given.  Labs, imaging and vitals reviewed.  Glucose is slightly elevated at 177, urine is not consistent with infection, lipase is not elevated and he does not have any significant hematologic or electrolyte derangements.  His blood pressure is elevated, however he has not taken his home blood pressure medicines.  Patient does not meet the SIRS or  Sepsis criteria.  On repeat exam patient does not have a surgical abdomen and there are no peritoneal signs.  No indication of appendicitis, bowel obstruction, bowel perforation, cholecystitis, diverticulitis.   Patient discharged home with symptomatic treatment and given strict instructions for follow-up with their primary care physician.  He is also given prescriptions for Bentyl, and Prilosec.  He was able to p.o. challenge in the department without difficulty.  Suspect viral gastritis.  I have also discussed reasons to return immediately to the ER.  Patient expresses understanding and agrees with plan.   Final Clinical Impressions(s) / ED Diagnoses   Final diagnoses:  Generalized abdominal pain    ED Discharge Orders         Ordered    dicyclomine (BENTYL) 20 MG tablet  3 times daily before meals & bedtime     01/29/19 0415    omeprazole (PRILOSEC) 20 MG capsule  Daily     01/29/19 0415           Lorin Glass, PA-C 01/29/19 2130    Ripley Fraise, MD 01/29/19 [phone]

## 2019-02-04 ENCOUNTER — Other Ambulatory Visit (HOSPITAL_COMMUNITY): Payer: Self-pay | Admitting: Nurse Practitioner

## 2019-02-21 ENCOUNTER — Encounter: Payer: Self-pay | Admitting: Internal Medicine

## 2019-02-21 ENCOUNTER — Ambulatory Visit (INDEPENDENT_AMBULATORY_CARE_PROVIDER_SITE_OTHER): Payer: Medicare Other | Admitting: Internal Medicine

## 2019-02-21 DIAGNOSIS — R1319 Other dysphagia: Secondary | ICD-10-CM

## 2019-02-21 DIAGNOSIS — R1310 Dysphagia, unspecified: Secondary | ICD-10-CM | POA: Diagnosis not present

## 2019-02-21 DIAGNOSIS — I480 Paroxysmal atrial fibrillation: Secondary | ICD-10-CM

## 2019-02-21 DIAGNOSIS — I10 Essential (primary) hypertension: Secondary | ICD-10-CM | POA: Diagnosis not present

## 2019-02-21 DIAGNOSIS — I2510 Atherosclerotic heart disease of native coronary artery without angina pectoris: Secondary | ICD-10-CM

## 2019-02-21 DIAGNOSIS — R0600 Dyspnea, unspecified: Secondary | ICD-10-CM | POA: Insufficient documentation

## 2019-02-21 DIAGNOSIS — R0602 Shortness of breath: Secondary | ICD-10-CM

## 2019-02-21 DIAGNOSIS — E1159 Type 2 diabetes mellitus with other circulatory complications: Secondary | ICD-10-CM

## 2019-02-21 MED ORDER — PANTOPRAZOLE SODIUM 40 MG PO TBEC: 40.0000 mg | DELAYED_RELEASE_TABLET | Freq: Every day | ORAL | 3 refills | Status: DC

## 2019-02-21 NOTE — Assessment & Plan Note (Signed)
On metformin and Prandin ADA diet Lose weight

## 2019-02-21 NOTE — Assessment & Plan Note (Signed)
Lopressor

## 2019-02-21 NOTE — Assessment & Plan Note (Signed)
Continue with Lopressor, daily baby aspirin, amiodarone, Eliquis

## 2019-02-21 NOTE — Assessment & Plan Note (Signed)
No angina, no cardiac dyspnea

## 2019-02-21 NOTE — Assessment & Plan Note (Signed)
unclear etiology, likely multifactorial Lose weight Mild exercise Treat GERD-start Protonix Pulmonary consultation

## 2019-02-21 NOTE — Progress Notes (Signed)
Virtual Visit via Telephone Note  I connected with [name] on 02/21/19 at  2:00 PM EDT by telephone and verified that I am speaking with the correct person using two identifiers.   I discussed the limitations, risks, security and privacy concerns of performing an evaluation and management service by telephone and the availability of in person appointments. I also discussed with the patient that there may be a patient responsible charge related to this service. The patient expressed understanding and agreed to proceed.   History of Present Illness:   [name] has a number of complaints:  1.  Shortness of breath or 6-12 months or duration.  His cardiac work-up with Dr. Harrington Challenger was negative.  He was advised to see a lung specialist.  Of note he used to smoke a long time ago for a total of 30 years.  No wheezing or cough.  He denies recent weight gain.  He thinks that his breathing problem has something to do with his trouble swallowing.  2.  Is complaining of problem with swallowing especially chicken, steak, bread.  Is been going on for 12 months he is not taking any antacids  3.  Follow-up hypertension, atrial fibrillation, diabetes  Observations/Objective:  He is in no acute distress.  No dyspnea at rest.  He is overweight Assessment and Plan:  See plan Follow Up Instructions:    I discussed the assessment and treatment plan with the patient. The patient was provided an opportunity to ask questions and all were answered. The patient agreed with the plan and demonstrated an understanding of the instructions.   The patient was advised to call back or seek an in-person evaluation if the symptoms worsen or if the condition fails to improve as anticipated.  I provided 20 minutes of non-face-to-face time during this encounter.   Walker Kehr, MD

## 2019-02-21 NOTE — Assessment & Plan Note (Signed)
The problem has been worsening over past 12 months GI referral to see Dr. Geraldine Contras diet, chew food well Start Protonix 40 mg daily

## 2019-02-28 ENCOUNTER — Other Ambulatory Visit: Payer: Self-pay

## 2019-02-28 ENCOUNTER — Encounter: Payer: Self-pay | Admitting: Gastroenterology

## 2019-02-28 ENCOUNTER — Ambulatory Visit (INDEPENDENT_AMBULATORY_CARE_PROVIDER_SITE_OTHER): Payer: Medicare Other | Admitting: Gastroenterology

## 2019-02-28 VITALS — Ht 68.0 in | Wt 220.0 lb

## 2019-02-28 DIAGNOSIS — Z7901 Long term (current) use of anticoagulants: Secondary | ICD-10-CM

## 2019-02-28 DIAGNOSIS — R1084 Generalized abdominal pain: Secondary | ICD-10-CM

## 2019-02-28 DIAGNOSIS — R1310 Dysphagia, unspecified: Secondary | ICD-10-CM | POA: Diagnosis not present

## 2019-02-28 NOTE — Patient Instructions (Addendum)
[facility] will contact you to schedule your Barium Esophagram.   You have been scheduled for an endoscopy. Please follow written instructions given to you at your visit today. If you use inhalers (even only as needed), please bring them with you on the day of your procedure.

## 2019-02-28 NOTE — Progress Notes (Signed)
History of Present Illness: This is an 84 year old male referred by [name], Evie Lacks, MD for the evaluation of dysphagia.  Patient relates frequent episodes of solid food dysphagia over the past 12 months.  The symptoms have not progressed in frequency or severity over the past 2 to 3 months.  He states that chewing his food more thoroughly and drinking water to wash down food has helped.  ED evaluation for abdominal pain in 01/2019 with an unremarkable evaluation as below and he was prescribed Bentyl and Prilosec.  He states his abdominal pain resolved.  He relates episodes of intermittent generalized abdominal pain for the past several months and since that ED visit he has determined that eating and overeating chocolate appears to be the cause of his intermittent abdominal pain. He has discontinued chocolate and he feels well. After recent visit to Dr. Alain Marion pantoprazole 40 mg daily was started to help address dysphagia symptoms. Denies weight loss, constipation, diarrhea, change in stool caliber, melena, hematochezia, nausea, vomiting, chest pain.  CMP, CBC, lipase unremarkable in 01/2019.  Abd/pelvic CT 01/2019 No acute abnormality of the abdomen or pelvis. Aortic atherosclerosis Left hip arthroplasty Multilevel spinal DJD  Last colonoscopy in 12/2014 showed a sessile polyp in the rectum; polypectomy performed with a cold snare (hyperplastic) and mild diverticulosis in the sigmoid colon.   No Known Allergies Outpatient Medications Prior to Visit  Medication Sig Dispense Refill  . amiodarone (PACERONE) 200 MG tablet TAKE 1 TABLET BY MOUTH EVERY DAY 90 tablet 1  . aspirin 81 MG tablet Take 81 mg by mouth daily.     . cholecalciferol (VITAMIN D) 1000 units tablet Take 1,000 Units by mouth 2 (two) times daily.    Marland Kitchen ELIQUIS 5 MG TABS tablet TAKE 1 TABLET BY MOUTH TWICE A DAY 60 tablet 3  . gabapentin (NEURONTIN) 100 MG capsule TAKE 1 CAPSULE BY MOUTH THREE TIMES A DAY 270 capsule 1  .  metFORMIN (GLUCOPHAGE) 500 MG tablet TAKE 1 TABLET BY MOUTH 3 TIMES A DAY 270 tablet 3  . metoprolol succinate (TOPROL XL) 25 MG 24 hr tablet Take 1 tablet (25 mg total) by mouth daily. 90 tablet 3  . pantoprazole (PROTONIX) 40 MG tablet Take 1 tablet (40 mg total) by mouth daily. 90 tablet 3  . Propylene Glycol (SYSTANE BALANCE) 0.6 % SOLN Place 1 drop into both eyes 2 (two) times daily as needed (for dry eyes).    . repaglinide (PRANDIN) 2 MG tablet Take 2 mg by mouth 3 (three) times daily before meals.    . simvastatin (ZOCOR) 10 MG tablet Take 1 tablet (10 mg total) by mouth daily. 90 tablet 3  . dicyclomine (BENTYL) 20 MG tablet Take 1 tablet (20 mg total) by mouth 4 (four) times daily -  before meals and at bedtime. 20 tablet 0  . omeprazole (PRILOSEC) 20 MG capsule Take 1 capsule (20 mg total) by mouth daily for 14 days. 14 capsule 0   No facility-administered medications prior to visit.    Past Medical History:  Diagnosis Date  . Arthritis   . Atrial fibrillation ([place])    post op; amiodarone and coumadin continued for 3 mos post op  . CARCINOMA, SKIN, SQUAMOUS CELL 01/07/2010  . Cataract    beginning of cataracts  . COLONIC POLYPS, HX OF 04/26/2007  . Coronary artery disease    s/p CABG 3/12: L-LAD, S-CFX (Dr. Roxy Manns); EF 45% at cath prior to CABG  . DIABETES  MELLITUS, TYPE II 04/26/2007  . Hyperlipidemia   . HYPERTENSION 04/26/2007  . NEPHROLITHIASIS, HX OF 04/26/2007  . SBO (small bowel obstruction) ([place]) 10/2015   Partial   . Shortness of breath dyspnea    with exertion  . Unspecified hearing loss 12/18/2008   Past Surgical History:  Procedure Laterality Date  . BACK SURGERY     disectomy  . CARDIOVERSION N/A 06/08/2018   Procedure: CARDIOVERSION;  Surgeon: Buford Dresser, MD;  Location: [facility] ENDOSCOPY;  Service: Cardiovascular;  Laterality: N/A;  . CARDIOVERSION N/A 07/25/2018   Procedure: CARDIOVERSION;  Surgeon: Fay Records, MD;  Location: [place];  Service:  Cardiovascular;  Laterality: N/A;  . COLONOSCOPY    . CORONARY ARTERY BYPASS GRAFT  01/12/2011  . LUMBAR LAMINECTOMY/DECOMPRESSION MICRODISCECTOMY N/A 05/08/2015   Procedure: LUMBAR LAMINECTOMY/DECOMPRESSION MICRODISCECTOMY 1 LEVEL Lumbar four five;  Surgeon: Melina Schools, MD;  Location: [facility];  Service: Orthopedics;  Laterality: N/A;  . TOTAL HIP ARTHROPLASTY Left    left on 04/15/07  . TYMPANIC MEMBRANE REPAIR  1981   Social History   Socioeconomic History  . Marital status: Married    Spouse name: Not on file  . Number of children: Not on file  . Years of education: Not on file  . Highest education level: Not on file  Occupational History  . Not on file  Social Needs  . Financial resource strain: Not on file  . Food insecurity:    Worry: Not on file    Inability: Not on file  . Transportation needs:    Medical: Not on file    Non-medical: Not on file  Tobacco Use  . Smoking status: Former Smoker    Last attempt to quit: 01/02/1981    Years since quitting: 38.1  . Smokeless tobacco: Never Used  Substance and Sexual Activity  . Alcohol use: No    Alcohol/week: 0.0 standard drinks  . Drug use: No  . Sexual activity: Not on file  Lifestyle  . Physical activity:    Days per week: Not on file    Minutes per session: Not on file  . Stress: Not on file  Relationships  . Social connections:    Talks on phone: Not on file    Gets together: Not on file    Attends religious service: Not on file    Active member of club or organization: Not on file    Attends meetings of clubs or organizations: Not on file    Relationship status: Not on file  Other Topics Concern  . Not on file  Social History Narrative  . Not on file   Family History  Problem Relation Age of Onset  . Heart failure Mother   . Heart disease Father   . Cancer Sister   . Cancer Brother   . Heart disease Brother   . Breast cancer Sister   . Colon cancer Neg Hx      Review of Systems: Pertinent  positive and negative review of systems were noted in the above HPI section. All other review of systems were otherwise negative.   Physical Exam: Telemedicine visit - not performed   Assessment and Recommendations:  1. Dysphagia to solids for 12 months, nonprogressive.  Rule out stricture, motility disturbance, GERD, esophagitis, less likely neoplasm.  Recommended urgent barium esophagram and urgent EGD with possible dilation. Option of delaying due to COVID-19 pandemic discussed.  The patient states he would like to wait to schedule due to COVID-19 pandemic.  Advised if his symptoms worsen or he changes his mind he should call and we will schedule esophagram and EGD urgently.  Continue pantoprazole 40 mg daily.  Cut meat and chicken into very small pieces or avoid them. Follow a soft food diet until the work-up is completed.  The risks (including bleeding, perforation, infection, missed lesions, medication reactions and possible hospitalization or surgery if complications occur), benefits, and alternatives to endoscopy with possible biopsy and possible dilation were discussed with the patient and they consent to proceed. REV in 1 month.   2. Afib on Eliquis.  Patient advised that when we proceed with endoscopy we will advise a hold 2 days before procedure - will instruct when and how to resume after procedure. Low but real risk of cardiovascular event such as heart attack, stroke, embolism, thrombosis or ischemia/infarct of other organs off Eliquis explained and need to seek urgent help if this occurs. The patient consents to proceed. Will communicate by phone or EMR with patient's prescribing provider to confirm that holding Eliquis is reasonable in this case when the procedure is scheduled.   3.  Intermittent generalized abdominal pain, etiology unclear but appears to be related to chocolate.  Patient is continuing to avoid chocolate. REV in 1 month.    4.  Personal history of adenomatous colon  polyps.  Due to age he is no longer in a colonoscopy surveillance program.  5. History of partial SBO.   These services were provided via telemedicine, audio only.  The patient was at home and the provider was in the office, alone.  We discussed the limitations of evaluation and management by telemedicine and the availability of in person appointments.  Patient consented for this telemedicine visit and is aware of possible charges for this service.  The other person participating in the telemedicine service was Marlon Pel, [place] who reviewed medications, allergies, past history and completed AVS.  Time spent on call: 15 minutes Time spend on call, reviewing records, coordinating care and CMA portion: 48 minutes   cc: [name], Evie Lacks, MD [address] [place], [place] [address]

## 2019-03-22 ENCOUNTER — Telehealth: Payer: Self-pay

## 2019-03-22 NOTE — Telephone Encounter (Signed)
[facility] Pre-operative Risk Assessment     Request for surgical clearance:     Endoscopy Procedure  What type of surgery is being performed?     EGD  When is this surgery scheduled?     04/11/19  What type of clearance is required ?   Pharmacy  Are there any medications that need to be held prior to surgery and how long? Eliquis x 2 days  Practice name and name of physician performing surgery?      [facility]  What is your office phone and fax number?      Phone- [phone]  [phone]  Anesthesia type (None, local, MAC, general) ?       MAC

## 2019-03-22 NOTE — Addendum Note (Signed)
Addended by: Marzella Schlein on: 03/22/2019 05:01 PM   Modules accepted: Orders

## 2019-03-23 NOTE — Telephone Encounter (Signed)
   Primary Cardiologist: [name], MD  Chart reviewed as part of pre-operative protocol coverage.   [name] was last seen on 12/29/18 by Dr. Harrington Challenger.  Since then, he has done well. He denies new cardiac complaints.   [facility] has requested guidance on holding anticoagulation for planned procedure.    Per our pharmacy staff: Patient with diagnosis of afib on Eliquis for anticoagulation.    Procedure: EGD Date of procedure: 04/11/2019  CHADS2-VASc score of  5 (CHF, HTN, AGE, DM2, stroke/tia x 2, CAD, AGE, male)  CrCl 17ml/min  Per office protocol, patient can hold Eliquis for 2 days prior to procedure.     I will route this recommendation to the requesting party via Epic fax function and remove from pre-op pool.  Please call with questions.  [place], PA 03/23/2019, 9:58 AM

## 2019-03-23 NOTE — Telephone Encounter (Signed)
Patient with diagnosis of afib on Eliquis for anticoagulation.    Procedure: EGD Date of procedure: 04/11/2019  CHADS2-VASc score of  5 (CHF, HTN, AGE, DM2, stroke/tia x 2, CAD, AGE, male)  CrCl 28ml/min  Per office protocol, patient can hold Eliquis for 2 days prior to procedure.

## 2019-03-28 ENCOUNTER — Ambulatory Visit: Payer: Medicare Other | Admitting: Gastroenterology

## 2019-03-31 ENCOUNTER — Ambulatory Visit: Payer: Medicare Other | Admitting: Gastroenterology

## 2019-04-04 ENCOUNTER — Encounter: Payer: Self-pay | Admitting: Gastroenterology

## 2019-04-04 ENCOUNTER — Other Ambulatory Visit: Payer: Self-pay

## 2019-04-04 ENCOUNTER — Ambulatory Visit (AMBULATORY_SURGERY_CENTER): Payer: Self-pay

## 2019-04-04 VITALS — Ht 68.0 in | Wt 220.0 lb

## 2019-04-04 DIAGNOSIS — R1310 Dysphagia, unspecified: Secondary | ICD-10-CM

## 2019-04-04 NOTE — Progress Notes (Signed)
No egg or soy allergy known to patient  No issues with past sedation with any surgeries  or procedures, no intubation problems  No diet pills per patient No home 02 use per patient  Pt is taking Eliquis, pt was advised last day to take Eliquis is may 23, and to hold Eliquis 2 days prior. Pt denies issues with constipation  No A fib or A flutter  EMMI video sent to pt's e mail , pt declined Pt daughter tested positive for covid 58 in March and has recovered from it. Pt does live in the home with the daughter. Pt nor daughter has not had any symptoms.

## 2019-04-06 ENCOUNTER — Ambulatory Visit
Admission: RE | Admit: 2019-04-06 | Discharge: 2019-04-06 | Disposition: A | Discharge status: Home / Self Care | Payer: Medicare Other | Source: Ambulatory Visit | Attending: Gastroenterology | Admitting: Gastroenterology

## 2019-04-06 DIAGNOSIS — R1310 Dysphagia, unspecified: Secondary | ICD-10-CM

## 2019-04-06 DIAGNOSIS — R1084 Generalized abdominal pain: Secondary | ICD-10-CM

## 2019-04-06 DIAGNOSIS — Z7901 Long term (current) use of anticoagulants: Secondary | ICD-10-CM

## 2019-04-09 ENCOUNTER — Telehealth: Payer: Self-pay | Admitting: *Deleted

## 2019-04-09 NOTE — Telephone Encounter (Signed)
Covid-19 travel screening questions  Have you traveled in the last 14 days? No If yes where?  Do you now or have you had a fever in the last 14 days? No  Do you have any respiratory symptoms of shortness of breath or cough now or in the last 14 days? No  Do you have any family members or close contacts with diagnosed or suspected Covid-19? Yes, pt daughter diagnosed with Covid-19 in early March. No one since that time.  Pt aware that care partner is to wait in parking lot and to wear a mask into the building.

## 2019-04-11 ENCOUNTER — Encounter: Payer: Medicare Other | Admitting: Gastroenterology

## 2019-04-11 ENCOUNTER — Telehealth: Payer: Self-pay | Admitting: Internal Medicine

## 2019-04-11 ENCOUNTER — Telehealth: Payer: Self-pay | Admitting: Gastroenterology

## 2019-04-11 NOTE — Telephone Encounter (Signed)
Patients daughter called [facility] on 5/25 at 2:08pm stating that patient had a bp of 172/84, pale, warm and vomited x1.  Denies diarrhea.  Earlier this bp was 195/92.  He feels clammy.  Denies pain.  BG 171.  States he was eating and drinking well that day.   Nurse states probably mild influenza and gave instructions on how to care at home.

## 2019-04-11 NOTE — Telephone Encounter (Signed)
Pt schedule to VOV tomorrow to discuss with [name].

## 2019-04-11 NOTE — Telephone Encounter (Signed)
FYI

## 2019-04-11 NOTE — Telephone Encounter (Signed)
Pls go to ER if worse Thx

## 2019-04-11 NOTE — Telephone Encounter (Signed)
Pt is scheduled for an EGD today at 2:30 PM.  He reported that last night he was feeling dizzy and his BP was 190/90.  Please advise.

## 2019-04-11 NOTE — Telephone Encounter (Signed)
Returned call to Pt.  Pt reported yesterday he became dizzy, nausea and vomited x1.  He reported he felt like he was going " to pass out".    His b/p was 190/90.  This am pt said he feels better B/P 151/79 but still very weak.  He wants to cancel EDG for today and he will call his primary care MD.  Pt will call to reschedule EGD.  Dustin Flock, RN advised Dr. Lucio Edward and he said OK to cancel EGD but pt. To call his primary MD.  Pt said he will call Primary MD.

## 2019-04-12 ENCOUNTER — Encounter: Payer: Self-pay | Admitting: Internal Medicine

## 2019-04-12 ENCOUNTER — Ambulatory Visit (INDEPENDENT_AMBULATORY_CARE_PROVIDER_SITE_OTHER): Payer: Medicare Other | Admitting: Internal Medicine

## 2019-04-12 DIAGNOSIS — I10 Essential (primary) hypertension: Secondary | ICD-10-CM

## 2019-04-12 DIAGNOSIS — I480 Paroxysmal atrial fibrillation: Secondary | ICD-10-CM | POA: Diagnosis not present

## 2019-04-12 DIAGNOSIS — R55 Syncope and collapse: Secondary | ICD-10-CM | POA: Diagnosis not present

## 2019-04-12 DIAGNOSIS — E1159 Type 2 diabetes mellitus with other circulatory complications: Secondary | ICD-10-CM | POA: Diagnosis not present

## 2019-04-12 NOTE — Assessment & Plan Note (Signed)
Recurrent.  The patient had 2 episodes one in April and one in May.  The etiology remains unclear.  He denies a spinning sensation as such. I like to obtain cardiology consultation to see if he needs to have a Holter monitor placed.  He was asked not to drive.  Call ambulance if the problem came back.

## 2019-04-12 NOTE — Assessment & Plan Note (Signed)
Check blood sugar if the episode of lightheadedness comes back

## 2019-04-12 NOTE — Assessment & Plan Note (Signed)
On Eliquis, aspirin, amiodarone Follow-up with Dr. Harrington Challenger

## 2019-04-12 NOTE — Progress Notes (Signed)
Virtual Visit via Video Note  I connected with [place] ANTOLIN on 04/12/19 at  8:50 AM EDT by a video enabled telemedicine application and verified that I am speaking with the correct person using two identifiers.   I discussed the limitations of evaluation and management by telemedicine and the availability of in person appointments. The patient expressed understanding and agreed to proceed.  History of Present Illness: The patient is complaining of an episode of dizziness and lightheadedness 5 weeks ago lasted for a few minutes.  He almost passed out.  On Monday he had another episode like this when he became dizzy (not spinning) and lightheaded and felt like he was going to pass out.  The episode lasts for 5 to 10 minutes.  He fell off a couple times.  It happened in the evening.  His blood pressure was 190/90.  He does not recall his pulse count.  There was no chest pain.  He denies hypoglycemia. Follow-up hypertension, diabetes.  There has been no runny nose, cough, chest pain, shortness of breath, abdominal pain, diarrhea, constipation, arthralgias, skin rashes.   Observations/Objective: The patient appears to be in no acute distress, looks well.  Assessment and Plan:  See my Assessment and Plan. Follow Up Instructions:    I discussed the assessment and treatment plan with the patient. The patient was provided an opportunity to ask questions and all were answered. The patient agreed with the plan and demonstrated an understanding of the instructions.   The patient was advised to call back or seek an in-person evaluation if the symptoms worsen or if the condition fails to improve as anticipated.  I provided face-to-face time during this encounter. We were at different locations.   Walker Kehr, MD

## 2019-04-12 NOTE — Assessment & Plan Note (Signed)
Continue metoprolol. 

## 2019-05-03 ENCOUNTER — Other Ambulatory Visit: Payer: Self-pay

## 2019-05-03 ENCOUNTER — Telehealth (INDEPENDENT_AMBULATORY_CARE_PROVIDER_SITE_OTHER): Payer: Medicare Other | Admitting: Physician Assistant

## 2019-05-03 ENCOUNTER — Telehealth: Payer: Self-pay | Admitting: Radiology

## 2019-05-03 ENCOUNTER — Encounter: Payer: Self-pay | Admitting: Physician Assistant

## 2019-05-03 VITALS — BP 150/79 | HR 72 | Ht 67.0 in | Wt 220.0 lb

## 2019-05-03 DIAGNOSIS — I2510 Atherosclerotic heart disease of native coronary artery without angina pectoris: Secondary | ICD-10-CM | POA: Diagnosis not present

## 2019-05-03 DIAGNOSIS — R55 Syncope and collapse: Secondary | ICD-10-CM

## 2019-05-03 DIAGNOSIS — I480 Paroxysmal atrial fibrillation: Secondary | ICD-10-CM | POA: Diagnosis not present

## 2019-05-03 DIAGNOSIS — Z7189 Other specified counseling: Secondary | ICD-10-CM

## 2019-05-03 DIAGNOSIS — I10 Essential (primary) hypertension: Secondary | ICD-10-CM

## 2019-05-03 NOTE — Patient Instructions (Addendum)
Medication Instructions:   Your physician recommends that you continue on your current medications as directed. Please refer to the Current Medication list given to you today.  If you need a refill on your cardiac medications before your next appointment, please call your pharmacy.   Lab work: NONE ORDERED  TODAY   If you have labs (blood work) drawn today and your tests are completely normal, you will receive your results only by: Marland Kitchen MyChart Message (if you have MyChart) OR . A paper copy in the mail If you have any lab test that is abnormal or we need to change your treatment, we will call you to review the results.  Testing/Procedures:Your physician has recommended that you wear an event monitor. Event monitors are medical devices that record the heart's electrical activity. Doctors most often [place] these monitors to diagnose arrhythmias. Arrhythmias are problems with the speed or rhythm of the heartbeat. The monitor is a small, portable device. You can wear one while you do your normal daily activities. This is usually used to diagnose what is causing palpitations/syncope (passing out).   Follow-Up: At [facility], you and your health needs are our priority.  As part of our continuing mission to provide you with exceptional heart care, we have created designated Provider Care Teams.  These Care Teams include your primary Cardiologist (physician) and Advanced Practice Providers (APPs -  Physician Assistants and Nurse Practitioners) who all work together to provide you with the care you need, when you need it. You will need a follow up appointment in:  6 weeks. You may see [name], MD or one of the following Advanced Practice Providers on your designated Care Team: Richardson Dopp, PA-C [place], [place] . Daune Perch, NP  Any Other Special Instructions Will Be Listed Below (If Applicable). check his blood pressure 1-2 times per day for 1-2 weeks and send me the readings for review

## 2019-05-03 NOTE — Progress Notes (Signed)
Make sure pt is staying hydrated.

## 2019-05-03 NOTE — Telephone Encounter (Signed)
Enrolled patient for a 30 day [employer] Event monitor to be mailed. Brief instructions were gone over with patients daughter and she knows to expect the monitor to arrive in 3-4 days

## 2019-05-03 NOTE — Progress Notes (Signed)
Virtual Visit via Telephone Note   This visit type was conducted due to national recommendations for restrictions regarding the COVID-19 Pandemic (e.g. social distancing) in an effort to limit this patient's exposure and mitigate transmission in our community.  Due to his co-morbid illnesses, this patient is at least at moderate risk for complications without adequate follow up.  This format is felt to be most appropriate for this patient at this time.  The patient did not have access to video technology/had technical difficulties with video requiring transitioning to audio format only (telephone).  All issues noted in this document were discussed and addressed.  No physical exam could be performed with this format.  Please refer to the patient's chart for his  consent to telehealth for [facility].   Date:  05/03/2019   ID:  [name], DOB 1935-06-23, MRN [id number]  Patient Location: Home Provider Location: Home  PCP:  Cassandria Anger, MD  Cardiologist:  [name], MD   Electrophysiologist:  None   Evaluation Performed:  Consultation - [name] was referred by Dr. Alain Marion for the evaluation of near syncope.  Chief Complaint:  Near syncope  History of Present Illness:    [name] is a 84 y.o. male with:  Coronary artery disease s/p CABG in 01/2011  Myoview 11/19: no ischemia  Persistent AFib  S/p DCCV in 2019 >> ERAF  Amiodarone Rx >> DCCV 07/2018 (seen in [facility] Clinic)  Systolic CHF (Echo 3/41 w/ EF 40-45)  Diabetes Mellitus  Hypertension   Hyperlipidemia   Nephrolithiasis  Obesity   The patient was evaluated by Dr. Harrington Challenger in the fall of 2019 for shortness of breath.  Echo showed EF 40-45 but visually appeared better when reviewed by Dr. Harrington Challenger.  A myoview was neg for ischemia.  He was last seen in 12/2018 and was still in normal sinus rhythm.  He was recently seen by primary care in May 2020 for near syncope.  He is referred back to  Cardiology for the evaluation of near syncope by [name], Evie Lacks, MD.   He tells me that he has had 2 episodes of near syncope.  The last one occurred about 3 weeks ago.  The first time it occurred shortly after standing up.  The most recent episode occurred while he was sitting watching TV.  He has not had frank syncope.  He has not had chest discomfort.  He has chronic shortness of breath without change.  He has not had orthopnea, paroxysmal nocturnal dyspnea.  He does have some chronic lower extremity swelling without significant change.  He has not had any fever, cough, melena, hematochezia.  The patient does not have symptoms concerning for COVID-19 infection (fever, chills, cough, or new shortness of breath).    Past Medical History:  Diagnosis Date   Arthritis    Atrial fibrillation ([place])    post op; amiodarone and coumadin continued for 3 mos post op   CARCINOMA, SKIN, SQUAMOUS CELL 01/07/2010   Cataract    beginning of cataracts   COLONIC POLYPS, HX OF 04/26/2007   Coronary artery disease    s/p CABG 3/12: L-LAD, S-CFX (Dr. Roxy Manns); EF 45% at cath prior to CABG   DIABETES MELLITUS, TYPE II 04/26/2007   Hyperlipidemia    HYPERTENSION 04/26/2007   NEPHROLITHIASIS, HX OF 04/26/2007   SBO (small bowel obstruction) ([place]) 10/2015   Partial    Shortness of breath dyspnea    with exertion   Unspecified  hearing loss 12/18/2008   Past Surgical History:  Procedure Laterality Date   BACK SURGERY     disectomy   CARDIOVERSION N/A 06/08/2018   Procedure: CARDIOVERSION;  Surgeon: Buford Dresser, MD;  Location[facility] ENDOSCOPY;  Service: Cardiovascular;  Laterality: N/A;   CARDIOVERSION N/A 07/25/2018   Procedure: CARDIOVERSION;  Surgeon: Fay Records, MD;  Location: [place];  Service: Cardiovascular;  Laterality: N/A;   COLONOSCOPY     CORONARY ARTERY BYPASS GRAFT  01/12/2011   LUMBAR LAMINECTOMY/DECOMPRESSION MICRODISCECTOMY N/A 05/08/2015   Procedure: LUMBAR  LAMINECTOMY/DECOMPRESSION MICRODISCECTOMY 1 LEVEL Lumbar four five;  Surgeon: Melina Schools, MD;  Location: [place];  Service: Orthopedics;  Laterality: N/A;   TOTAL HIP ARTHROPLASTY Left    left on 04/15/07   TYMPANIC MEMBRANE REPAIR  1981     Current Meds  Medication Sig   amiodarone (PACERONE) 200 MG tablet TAKE 1 TABLET BY MOUTH EVERY DAY   aspirin 81 MG tablet Take 81 mg by mouth daily.    cholecalciferol (VITAMIN D) 1000 units tablet Take 1,000 Units by mouth 2 (two) times daily.   ELIQUIS 5 MG TABS tablet TAKE 1 TABLET BY MOUTH TWICE A DAY   gabapentin (NEURONTIN) 100 MG capsule TAKE 1 CAPSULE BY MOUTH THREE TIMES A DAY   metFORMIN (GLUCOPHAGE) 500 MG tablet TAKE 1 TABLET BY MOUTH 3 TIMES A DAY   metoprolol succinate (TOPROL XL) 25 MG 24 hr tablet Take 1 tablet (25 mg total) by mouth daily.   pantoprazole (PROTONIX) 40 MG tablet Take 1 tablet (40 mg total) by mouth daily.   Propylene Glycol (SYSTANE BALANCE) 0.6 % SOLN Place 1 drop into both eyes 2 (two) times daily as needed (for dry eyes).   repaglinide (PRANDIN) 2 MG tablet Take 2 mg by mouth 3 (three) times daily before meals.   simvastatin (ZOCOR) 10 MG tablet Take 1 tablet (10 mg total) by mouth daily.     Allergies:   Patient has no known allergies.   Social History   Tobacco Use   Smoking status: Former Smoker    Quit date: 01/02/1981    Years since quitting: 38.3   Smokeless tobacco: Never Used  Substance Use Topics   Alcohol use: No    Alcohol/week: 0.0 standard drinks   Drug use: No     Family Hx: The patient's family history includes Breast cancer in his sister; Cancer in his brother and sister; Heart disease in his brother and father; Heart failure in his mother. There is no history of Colon cancer, Esophageal cancer, Rectal cancer, or Stomach cancer.  ROS:   Please see the history of present illness.     All other systems reviewed and are negative.   Prior CV studies:   The following  studies were reviewed today:   Myoview 10/10/18 No evidence for a prior infarct or ischemia. LVEF 50%. Findings consistent with non-ischemic cardiomyopathy.  Echo 08/12/18 Mod LVH, EF 40-45, no RWMA, MAC, mild MR, mod LAE, trial TR, PASP 30  Echo 03/18/16 EF 55-60, no RWMA, Gr 1 DD, trivial MR, mod LAE, reduced RVSF, mild RAE, mild TR, PASP 25  Myoview 02/03/16 Moderate size and intensity fixed basal to mid septal perfusion defect suggestive of scar. No reversible ischemia. LVEF 44% with septal akinesis. This is an intermediate risk study.  Echo 03/22/12 EF 50, inf HK, mild LVH, MAC, mod LAE, mod RVE, mod reduced RVSF, mild RAE  Labs/Other Tests and Data Reviewed:    EKG:  No ECG reviewed.  Recent Labs: 05/10/2018: B Natriuretic Peptide 169.9; Magnesium 2.5 12/29/2018: TSH 2.640 01/28/2019: ALT 30; BUN 16; Creatinine, Ser 1.04; Hemoglobin 14.6; Platelets 247; Potassium 3.9; Sodium 139   Recent Lipid Panel Lab Results  Component Value Date/Time   CHOL 118 12/29/2018 11:51 AM   TRIG 102 12/29/2018 11:51 AM   TRIG 78 11/05/2006 11:23 AM   HDL 51 12/29/2018 11:51 AM   CHOLHDL 2.3 12/29/2018 11:51 AM   CHOLHDL 2.5 01/17/2016 12:09 PM   LDLCALC 47 12/29/2018 11:51 AM    Wt Readings from Last 3 Encounters:  05/03/19 220 lb (99.8 kg)  04/04/19 220 lb (99.8 kg)  02/28/19 220 lb (99.8 kg)     Objective:    Vital Signs:  BP (!) 150/79    Pulse 72    Ht _0  (1.702 m)    Wt 220 lb (99.8 kg)    BMI 34.46 kg/m    VITAL SIGNS:  reviewed GEN:  no acute distress RESPIRATORY:  No labored breathing NEURO:  Alert and oriented PSYCH:  Good mood  ASSESSMENT & PLAN:     Near syncope - Plan: Etiology not entirely clear.  Unfortunately, due to the limitations with telemedicine, I cannot review an EKG.  His symptoms do sound suspicious for bradycardia or sinus arrest.  I have recommended that we proceed with a 30-day event monitor to further evaluate.  If he does have significant pauses or  bradycardia, we will need to stop his beta-blocker and then repeat his monitor.  Follow-up with Dr. Harrington Challenger or me in 6 weeks.  Paroxysmal atrial fibrillation (HCC) - Plan: As noted, an event monitor will be arranged due to recent history of near syncope.  Continue anticoagulation with Apixaban.  Continue rhythm control with amiodarone.  Recent LFTs and TSH normal.  Coronary artery disease involving native coronary artery of native heart without angina pectoris - Plan: History of CABG in 2012.  Myoview in November 2019 negative for ischemia.  Continue statin therapy.  Essential hypertension - Plan: Blood pressure somewhat elevated today.  I have asked him to track his blood pressure over the next couple of weeks and send me readings for review.  COVID-19 Education: The signs and symptoms of COVID-19 were discussed with the patient and how to seek care for testing (follow up with PCP or arrange E-visit).  The importance of social distancing was discussed today.  Time:   Today, I have spent 14 minutes with the patient with telehealth technology discussing the above problems.     Medication Adjustments/Labs and Tests Ordered: Current medicines are reviewed at length with the patient today.  Concerns regarding medicines are outlined above.   Tests Ordered: Orders Placed This Encounter  Procedures   CARDIAC EVENT MONITOR    Medication Changes: No orders of the defined types were placed in this encounter.   Follow Up:  Virtual Visit or In Person in 6 week(s) Dr. Harrington Challenger or Richardson Dopp, PA-C  Signed, Richardson Dopp, PA-C  05/03/2019 5:30 PM    [place]

## 2019-05-03 NOTE — Progress Notes (Signed)
Spoke with patient and infomed to stay hydrated.

## 2019-05-11 ENCOUNTER — Ambulatory Visit (INDEPENDENT_AMBULATORY_CARE_PROVIDER_SITE_OTHER): Payer: Medicare Other

## 2019-05-11 DIAGNOSIS — R55 Syncope and collapse: Secondary | ICD-10-CM | POA: Diagnosis not present

## 2019-05-11 DIAGNOSIS — I480 Paroxysmal atrial fibrillation: Secondary | ICD-10-CM

## 2019-05-22 ENCOUNTER — Telehealth: Payer: Self-pay

## 2019-05-22 NOTE — Telephone Encounter (Signed)
Received strips from [employer].. from 05/21/19 at 9:06pm  Pt had HR 61... Dr. Lovena Le felt NSR. (DOD)   Pt says he was sleeping and has been feeling well.   Pt advised to continue his med regimen and to call if he has any problems.   Pt to f/u with Dr. Harrington Challenger 06/16/19.

## 2019-06-13 ENCOUNTER — Other Ambulatory Visit: Payer: Self-pay

## 2019-06-15 ENCOUNTER — Telehealth: Payer: Self-pay | Admitting: *Deleted

## 2019-06-15 NOTE — Telephone Encounter (Signed)
Pt has answered NO to the following covid19 prescreening questions:        COVID-19 Pre-Screening Questions:  . In the past 7 to 10 days have you had a cough,  shortness of breath, headache, congestion, fever (100 or greater) body aches, chills, sore throat, or sudden loss of taste or sense of smell? . Have you been around anyone with known Covid 19. . Have you been around anyone who is awaiting Covid 19 test results in the past 7 to 10 days? . Have you been around anyone who has been exposed to Covid 19, or has mentioned symptoms of Covid 19 within the past 7 to 10 days?  If you have any concerns/questions about symptoms patients report during screening (either on the phone or at threshold). Contact the provider seeing the patient or DOD for further guidance.  If neither are available contact a member of the leadership team.

## 2019-06-16 ENCOUNTER — Other Ambulatory Visit: Payer: Self-pay

## 2019-06-16 ENCOUNTER — Encounter: Payer: Self-pay | Admitting: Cardiology

## 2019-06-16 ENCOUNTER — Other Ambulatory Visit: Payer: Self-pay | Admitting: Internal Medicine

## 2019-06-16 ENCOUNTER — Ambulatory Visit (INDEPENDENT_AMBULATORY_CARE_PROVIDER_SITE_OTHER): Payer: Medicare Other | Admitting: Cardiology

## 2019-06-16 ENCOUNTER — Telehealth: Payer: Medicare Other | Admitting: Internal Medicine

## 2019-06-16 VITALS — BP 132/66 | HR 72 | Ht 67.0 in | Wt 234.4 lb

## 2019-06-16 DIAGNOSIS — I10 Essential (primary) hypertension: Secondary | ICD-10-CM

## 2019-06-16 DIAGNOSIS — R55 Syncope and collapse: Secondary | ICD-10-CM | POA: Diagnosis not present

## 2019-06-16 DIAGNOSIS — I480 Paroxysmal atrial fibrillation: Secondary | ICD-10-CM | POA: Diagnosis not present

## 2019-06-16 DIAGNOSIS — Z6836 Body mass index (BMI) 36.0-36.9, adult: Secondary | ICD-10-CM

## 2019-06-16 DIAGNOSIS — I2510 Atherosclerotic heart disease of native coronary artery without angina pectoris: Secondary | ICD-10-CM | POA: Diagnosis not present

## 2019-06-16 NOTE — Progress Notes (Signed)
Cardiology Office Note:    Date:  06/16/2019   ID:  [name], DOB 1935-04-16, MRN [id number]  PCP:  Cassandria Anger, MD  Cardiologist:  [name], MD  Referring MD: Cassandria Anger, MD   Chief Complaint  Patient presents with  . Follow-up    pre-syncope    History of Present Illness:    [name] is a 84 y.o. male with a past medical history significant for :  Coronary artery disease s/p CABG in 01/2011 ? Myoview 11/19: no ischemia  Persistent AFib ? S/p DCCV in 2019 >> ERAF ? Amiodarone Rx >> DCCV 07/2018 (seen in [facility])  Systolic CHF (Echo 1/63 w/ EF 40-45)  Diabetes Mellitus  Hypertension   Hyperlipidemia   Nephrolithiasis  Obesity   The patient was evaluated by Dr. Harrington Challenger in the fall of 2019 for shortness of breath.  Echo showed EF 40-45 but visually appeared better when reviewed by Dr. Harrington Challenger.  A myoview was neg for ischemia.  He was last seen in 12/2018 and was still in normal sinus rhythm.  He was recently seen by primary care in May 2020 for near syncope.  He is referred back to Cardiology for the evaluation of near syncope by [name], Evie Lacks, MD.   The patient was seen in the office on 05/03/2019 by Richardson Dopp, PA and reported 2 episodes of near syncope.  One occurred shortly after standing up to go to the bathroom in the night, the other occured while sitting down watching TV. He got cold/clammy, chills, nausea. It lasted about 3 minutes. He checked his BP right after and it was 190/90's.  He did not have frank syncope.  He had no chest discomfort.  He has chronic shortness of breath recent change.  Scott felt that his symptoms sounded suspicious for bradycardia or sinus arrest.  He had a 30-day event monitor placed which showed sinus rhythm with first-degree AV block, rare PVC and no significant arrhythmias.  Plan was to continue current medications.    [name] is here today alone. He has had no further near syncope for  the last month and a half. He has had no new symptoms or issues since last seen. He has plans for an endoscopy and to have his lungs checked with a pulmonologist.   He has mild lower leg edema. Has chronic DOE which he says it at his long time baseline. He limits his salt intake. He no longer eats out much. No chest discomfort.   Home BPs have been 120/s-130s/60s   Past Medical History:  Diagnosis Date  . Arthritis   . Atrial fibrillation ([place])    post op; amiodarone and coumadin continued for 3 mos post op  . CARCINOMA, SKIN, SQUAMOUS CELL 01/07/2010  . Cataract    beginning of cataracts  . COLONIC POLYPS, HX OF 04/26/2007  . Coronary artery disease    s/p CABG 3/12: L-LAD, S-CFX (Dr. Roxy Manns); EF 45% at cath prior to CABG  . DIABETES MELLITUS, TYPE II 04/26/2007  . Hyperlipidemia   . HYPERTENSION 04/26/2007  . NEPHROLITHIASIS, HX OF 04/26/2007  . SBO (small bowel obstruction) ([place]) 10/2015   Partial   . Shortness of breath dyspnea    with exertion  . Unspecified hearing loss 12/18/2008    Past Surgical History:  Procedure Laterality Date  . BACK SURGERY     disectomy  . CARDIOVERSION N/A 06/08/2018   Procedure: CARDIOVERSION;  Surgeon: Buford Dresser, MD;  Location: [place];  Service: Cardiovascular;  Laterality: N/A;  . CARDIOVERSION N/A 07/25/2018   Procedure: CARDIOVERSION;  Surgeon: Fay Records, MD;  Location: [facility] ENDOSCOPY;  Service: Cardiovascular;  Laterality: N/A;  . COLONOSCOPY    . CORONARY ARTERY BYPASS GRAFT  01/12/2011  . LUMBAR LAMINECTOMY/DECOMPRESSION MICRODISCECTOMY N/A 05/08/2015   Procedure: LUMBAR LAMINECTOMY/DECOMPRESSION MICRODISCECTOMY 1 LEVEL Lumbar four five;  Surgeon: Melina Schools, MD;  Location: [facility];  Service: Orthopedics;  Laterality: N/A;  . TOTAL HIP ARTHROPLASTY Left    left on 04/15/07  . TYMPANIC MEMBRANE REPAIR  1981    Current Medications: Current Meds  Medication Sig  . amiodarone (PACERONE) 200 MG tablet TAKE 1 TABLET BY MOUTH  EVERY DAY  . aspirin 81 MG tablet Take 81 mg by mouth daily.   . cholecalciferol (VITAMIN D) 1000 units tablet Take 1,000 Units by mouth 2 (two) times daily.  Marland Kitchen ELIQUIS 5 MG TABS tablet TAKE 1 TABLET BY MOUTH TWICE A DAY  . gabapentin (NEURONTIN) 100 MG capsule TAKE 1 CAPSULE BY MOUTH THREE TIMES A DAY  . metFORMIN (GLUCOPHAGE) 500 MG tablet TAKE 1 TABLET BY MOUTH 3 TIMES A DAY  . metoprolol succinate (TOPROL XL) 25 MG 24 hr tablet Take 1 tablet (25 mg total) by mouth daily.  . pantoprazole (PROTONIX) 40 MG tablet Take 1 tablet (40 mg total) by mouth daily.  Marland Kitchen Propylene Glycol (SYSTANE BALANCE) 0.6 % SOLN Place 1 drop into both eyes 2 (two) times daily as needed (for dry eyes).  . repaglinide (PRANDIN) 2 MG tablet TAKE 1 TABLET (2 MG TOTAL) BY MOUTH 3 (THREE) TIMES DAILY BEFORE MEALS.  . simvastatin (ZOCOR) 10 MG tablet Take 1 tablet (10 mg total) by mouth daily.     Allergies:   Patient has no known allergies.   Social History   Socioeconomic History  . Marital status: Married    Spouse name: Not on file  . Number of children: Not on file  . Years of education: Not on file  . Highest education level: Not on file  Occupational History  . Not on file  Social Needs  . Financial resource strain: Not on file  . Food insecurity    Worry: Not on file    Inability: Not on file  . Transportation needs    Medical: Not on file    Non-medical: Not on file  Tobacco Use  . Smoking status: Former Smoker    Quit date: 01/02/1981    Years since quitting: 38.4  . Smokeless tobacco: Never Used  Substance and Sexual Activity  . Alcohol use: No    Alcohol/week: 0.0 standard drinks  . Drug use: No  . Sexual activity: Not on file  Lifestyle  . Physical activity    Days per week: Not on file    Minutes per session: Not on file  . Stress: Not on file  Relationships  . Social Herbalist on phone: Not on file    Gets together: Not on file    Attends religious service: Not on file     Active member of club or organization: Not on file    Attends meetings of clubs or organizations: Not on file    Relationship status: Not on file  Other Topics Concern  . Not on file  Social History Narrative  . Not on file     Family History: The patient's family history includes Breast cancer in his sister; Cancer in his brother  and sister; Heart disease in his brother and father; Heart failure in his mother. There is no history of Colon cancer, Esophageal cancer, Rectal cancer, or Stomach cancer. ROS:   Please see the history of present illness.     All other systems reviewed and are negative.  EKGs/Labs/Other Studies Reviewed:    The following studies were reviewed today:  30-day event monitor resulted 06/14/2019 Sinus rhythm First degree AV block   Rare PVC No significant arrhythmias   Myoview 10/10/18 No evidence for a prior infarct or ischemia. LVEF 50%. Findings consistent with non-ischemic cardiomyopathy.  Echo 08/12/18 Mod LVH, EF 40-45, no RWMA, MAC, mild MR, mod LAE, trial TR, PASP 30  Echo 03/18/16 EF 55-60, no RWMA, Gr 1 DD, trivial MR, mod LAE, reduced RVSF, mild RAE, mild TR, PASP 25  Myoview 02/03/16 Moderate size and intensity fixed basal to mid septal perfusion defect suggestive of scar. No reversible ischemia. LVEF 44% with septal akinesis. This is an intermediate risk study.  Echo 03/22/12 EF 50, inf HK, mild LVH, MAC, mod LAE, mod RVE, mod reduced RVSF, mild RAE  EKG:  EKG is not ordered today.   Recent Labs: 12/29/2018: TSH 2.640 01/28/2019: ALT 30; BUN 16; Creatinine, Ser 1.04; Hemoglobin 14.6; Platelets 247; Potassium 3.9; Sodium 139   Recent Lipid Panel    Component Value Date/Time   CHOL 118 12/29/2018 1151   TRIG 102 12/29/2018 1151   TRIG 78 11/05/2006 1123   HDL 51 12/29/2018 1151   CHOLHDL 2.3 12/29/2018 1151   CHOLHDL 2.5 01/17/2016 1209   VLDL 20 01/17/2016 1209   LDLCALC 47 12/29/2018 1151    Physical Exam:    VS:  BP 132/66    Pulse 72   Ht _0  (1.702 m)   Wt 234 lb 6.4 oz (106.3 kg)   SpO2 94%   BMI 36.71 kg/m     Wt Readings from Last 3 Encounters:  06/16/19 234 lb 6.4 oz (106.3 kg)  05/03/19 220 lb (99.8 kg)  04/04/19 220 lb (99.8 kg)     Physical Exam  Constitutional: He is oriented to person, place, and time. He appears well-developed and well-nourished. No distress.  HENT:  Head: Normocephalic and atraumatic.  Neck: Normal range of motion. Neck supple. No JVD present.  Cardiovascular: Normal rate and regular rhythm.  Murmur heard. High-pitched blowing holosystolic murmur of grade 1/6 is also present at the apex. Pulmonary/Chest: Effort normal and breath sounds normal. No respiratory distress. He has no wheezes. He has no rales.  Abdominal: Soft. Bowel sounds are normal.  Large, round abdomen  Musculoskeletal: Normal range of motion.        General: Edema present.     Comments: Trace pretibial edema.   Neurological: He is alert and oriented to person, place, and time.  Skin: Skin is warm and dry.  Psychiatric: He has a normal mood and affect. His behavior is normal. Judgment and thought content normal.     ASSESSMENT:    1. Near syncope   2. Paroxysmal atrial fibrillation (HCC)   3. Coronary artery disease involving native coronary artery of native heart without angina pectoris   4. Essential hypertension   5. Class 2 severe obesity due to excess calories with serious comorbidity and body mass index (BMI) of 36.0 to 36.9 in adult [facility])    PLAN:    In order of problems listed above:  Near syncope -Reportedly 2 episodes of near syncope -No further episodes.  -30-day event  monitor showed no significant arrhythmias, bradycardias or pauses to account for his symptoms. He did not have any episodes with monitor on.  -Unclear what caused his episodes. Does not seem neurologic. First episode could have been orthostatic with rising up quickly but second occurred at rest. Will continue to  monitor. If he has other episodes could consider Loop recorder.   Paroxysmal atrial fibrillation -Maintaining sinus rhythm on recent 30-day monitor.  On amiodarone. -Continues on anticoagulation with apixaban for stroke risk reduction.  CAD without angina -History of CABG in 2012.  Myoview in November 2019- for ischemia. -Continue statin therapy. -No angina like symptoms.   Essential hypertension -BP well controlled.   Obesity -Body mass index is 36.71 kg/m. -Pt with large, round abdomen. Would benefit from weight loss.  -Pt says he is limited with his diet because his wife and daughter control what they eat and they don't like vegetables. Tried to encourage him. Written info given.    Medication Adjustments/Labs and Tests Ordered: Current medicines are reviewed at length with the patient today.  Concerns regarding medicines are outlined above. Labs and tests ordered and medication changes are outlined in the patient instructions below:  Patient Instructions  Medication Instructions:  Your physician recommends that you continue on your current medications as directed. Please refer to the Current Medication list given to you today.  If you need a refill on your cardiac medications before your next appointment, please call your pharmacy.   Lab work: None   If you have labs (blood work) drawn today and your tests are completely normal, you will receive your results only by: Marland Kitchen MyChart Message (if you have MyChart) OR . A paper copy in the mail If you have any lab test that is abnormal or we need to change your treatment, we will call you to review the results.  Testing/Procedures: None   Follow-Up: At [facility], you and your health needs are our priority.  As part of our continuing mission to provide you with exceptional heart care, we have created designated Provider Care Teams.  These Care Teams include your primary Cardiologist (physician) and Advanced Practice Providers  (APPs -  Physician Assistants and Nurse Practitioners) who all work together to provide you with the care you need, when you need it. You will need a follow up appointment in:  6 months.  Please call our office 2 months in advance to schedule this appointment.  You may see [name], MD or one of the following Advanced Practice Providers on your designated Care Team: Richardson Dopp, PA-C [place], [place] . Daune Perch, NP  Any Other Special Instructions Will Be Listed Below (If Applicable).  Lifestyle Modifications to Prevent and Treat Heart Disease -Recommend heart healthy/Mediterranean diet, with whole grains, fruits, vegetable, fish, lean meats, nuts, and olive oil.  -Limit salt. -Recommend moderate walking, 3-5 times/week for 30-50 minutes each session. Aim for at least 150 minutes.week. Goal should be pace of 3 miles/hours, or walking 1.5 miles in 30 minutes -Recommend avoidance of tobacco products. Avoid excess alcohol. -Keep blood pressure well controlled, ideally less than 130/80.     Mediterranean Diet A Mediterranean diet refers to food and lifestyle choices that are based on the traditions of countries located on the [employer]. This way of eating has been shown to help prevent certain conditions and improve outcomes for people who have chronic diseases, like kidney disease and heart disease. What are tips for following this plan? Lifestyle  Cook and  eat meals together with your family, when possible.  Drink enough fluid to keep your urine clear or pale yellow.  Be physically active every day. This includes: ? Aerobic exercise like running or swimming. ? Leisure activities like gardening, walking, or housework.  Get 7-8 hours of sleep each night.  If recommended by your health care provider, drink red wine in moderation. This means 1 glass a day for nonpregnant women and 2 glasses a day for men. A glass of wine equals 5 oz (150 mL). Reading food labels   Check  the serving size of packaged foods. For foods such as rice and pasta, the serving size refers to the amount of cooked product, not dry.  Check the total fat in packaged foods. Avoid foods that have saturated fat or trans fats.  Check the ingredients list for added sugars, such as corn syrup. Shopping  At the grocery store, buy most of your food from the areas near the walls of the store. This includes: ? Fresh fruits and vegetables (produce). ? Grains, beans, nuts, and seeds. Some of these may be available in unpackaged forms or large amounts (in bulk). ? Fresh seafood. ? Poultry and eggs. ? Low-fat dairy products.  Buy whole ingredients instead of prepackaged foods.  Buy fresh fruits and vegetables in-season from local farmers markets.  Buy frozen fruits and vegetables in resealable bags.  If you do not have access to quality fresh seafood, buy precooked frozen shrimp or canned fish, such as tuna, salmon, or sardines.  Buy small amounts of raw or cooked vegetables, salads, or olives from the deli or salad bar at your store.  Stock your pantry so you always have certain foods on hand, such as olive oil, canned tuna, canned tomatoes, rice, pasta, and beans. Cooking  Cook foods with extra-virgin olive oil instead of using butter or other vegetable oils.  Have meat as a side dish, and have vegetables or grains as your [name] dish. This means having meat in small portions or adding small amounts of meat to foods like pasta or stew.  Use beans or vegetables instead of meat in common dishes like chili or lasagna.  Experiment with different cooking methods. Try roasting or broiling vegetables instead of steaming or sauteing them.  Add frozen vegetables to soups, stews, pasta, or rice.  Add nuts or seeds for added healthy fat at each meal. You can add these to yogurt, salads, or vegetable dishes.  Marinate fish or vegetables using olive oil, lemon juice, garlic, and fresh herbs. Meal  planning   Plan to eat 1 vegetarian meal one day each week. Try to work up to 2 vegetarian meals, if possible.  Eat seafood 2 or more times a week.  Have healthy snacks readily available, such as: ? Vegetable sticks with hummus. ? [place] yogurt. ? Fruit and nut trail mix.  Eat balanced meals throughout the week. This includes: ? Fruit: 2-3 servings a day ? Vegetables: 4-5 servings a day ? Low-fat dairy: 2 servings a day ? Fish, poultry, or lean meat: 1 serving a day ? Beans and legumes: 2 or more servings a week ? Nuts and seeds: 1-2 servings a day ? Whole grains: 6-8 servings a day ? Extra-virgin olive oil: 3-4 servings a day  Limit red meat and sweets to only a few servings a month What are my food choices?  Mediterranean diet ? Recommended  Grains: Whole-grain pasta. Brown rice. Bulgar wheat. Polenta. Couscous. Whole-wheat bread. [place].  Vegetables: Artichokes. Beets. Broccoli. Cabbage. Carrots. Eggplant. Green beans. Chard. Kale. Spinach. Onions. Leeks. Peas. Squash. Tomatoes. Peppers. Radishes.  Fruits: Apples. Apricots. Avocado. Berries. Bananas. Cherries. Dates. Figs. Grapes. Lemons. Melon. Oranges. Peaches. Plums. Pomegranate.  Meats and other protein foods: Beans. Almonds. Sunflower seeds. Pine nuts. Peanuts. [place]. Salmon. Scallops. Shrimp. Alfalfa. Tilapia. Clams. Oysters. Eggs.  Dairy: Low-fat milk. Cheese. Greek yogurt.  Beverages: Water. Red wine. Herbal tea.  Fats and oils: Extra virgin olive oil. Avocado oil. Grape seed oil.  Sweets and desserts: [place] yogurt with honey. Baked apples. Poached pears. Trail mix.  Seasoning and other foods: Basil. Cilantro. Coriander. Cumin. Mint. Parsley. Sage. Rosemary. Tarragon. Garlic. Oregano. Thyme. Pepper. Balsalmic vinegar. Tahini. Hummus. Tomato sauce. Olives. Mushrooms. ? Limit these  Grains: Prepackaged pasta or rice dishes. Prepackaged cereal with added sugar.  Vegetables: Deep fried potatoes (french  fries).  Fruits: Fruit canned in syrup.  Meats and other protein foods: Beef. Pork. Lamb. Poultry with skin. Hot dogs. Berniece Salines.  Dairy: Ice cream. Sour cream. Whole milk.  Beverages: Juice. Sugar-sweetened soft drinks. Beer. Liquor and spirits.  Fats and oils: Butter. Canola oil. Vegetable oil. Beef fat (tallow). Lard.  Sweets and desserts: Cookies. Cakes. Pies. Candy.  Seasoning and other foods: Mayonnaise. Premade sauces and marinades. The items listed may not be a complete list. Talk with your dietitian about what dietary choices are right for you. Summary  The Mediterranean diet includes both food and lifestyle choices.  Eat a variety of fresh fruits and vegetables, beans, nuts, seeds, and whole grains.  Limit the amount of red meat and sweets that you eat.  Talk with your health care provider about whether it is safe for you to drink red wine in moderation. This means 1 glass a day for nonpregnant women and 2 glasses a day for men. A glass of wine equals 5 oz (150 mL). This information is not intended to replace advice given to you by your health care provider. Make sure you discuss any questions you have with your health care provider. Document Released: 06/25/2016 Document Revised: 07/02/2016 Document Reviewed: 06/25/2016 Elsevier Patient Education  2020 [place], Daune Perch, NP  06/16/2019 1:59 PM    [facility]

## 2019-06-16 NOTE — Patient Instructions (Addendum)
Medication Instructions:  Your physician recommends that you continue on your current medications as directed. Please refer to the Current Medication list given to you today.  If you need a refill on your cardiac medications before your next appointment, please call your pharmacy.   Lab work: None   If you have labs (blood work) drawn today and your tests are completely normal, you will receive your results only by: Marland Kitchen MyChart Message (if you have MyChart) OR . A paper copy in the mail If you have any lab test that is abnormal or we need to change your treatment, we will call you to review the results.  Testing/Procedures: None   Follow-Up: At [facility], you and your health needs are our priority.  As part of our continuing mission to provide you with exceptional heart care, we have created designated Provider Care Teams.  These Care Teams include your primary Cardiologist (physician) and Advanced Practice Providers (APPs -  Physician Assistants and Nurse Practitioners) who all work together to provide you with the care you need, when you need it. You will need a follow up appointment in:  6 months.  Please call our office 2 months in advance to schedule this appointment.  You may see [name], MD or one of the following Advanced Practice Providers on your designated Care Team: Richardson Dopp, PA-C [place], [place] . Daune Perch, NP  Any Other Special Instructions Will Be Listed Below (If Applicable).  Lifestyle Modifications to Prevent and Treat Heart Disease -Recommend heart healthy/Mediterranean diet, with whole grains, fruits, vegetable, fish, lean meats, nuts, and olive oil.  -Limit salt. -Recommend moderate walking, 3-5 times/week for 30-50 minutes each session. Aim for at least 150 minutes.week. Goal should be pace of 3 miles/hours, or walking 1.5 miles in 30 minutes -Recommend avoidance of tobacco products. Avoid excess alcohol. -Keep blood pressure well controlled,  ideally less than 130/80.     Mediterranean Diet A Mediterranean diet refers to food and lifestyle choices that are based on the traditions of countries located on the [employer]. This way of eating has been shown to help prevent certain conditions and improve outcomes for people who have chronic diseases, like kidney disease and heart disease. What are tips for following this plan? Lifestyle  Cook and eat meals together with your family, when possible.  Drink enough fluid to keep your urine clear or pale yellow.  Be physically active every day. This includes: ? Aerobic exercise like running or swimming. ? Leisure activities like gardening, walking, or housework.  Get 7-8 hours of sleep each night.  If recommended by your health care provider, drink red wine in moderation. This means 1 glass a day for nonpregnant women and 2 glasses a day for men. A glass of wine equals 5 oz (150 mL). Reading food labels   Check the serving size of packaged foods. For foods such as rice and pasta, the serving size refers to the amount of cooked product, not dry.  Check the total fat in packaged foods. Avoid foods that have saturated fat or trans fats.  Check the ingredients list for added sugars, such as corn syrup. Shopping  At the grocery store, buy most of your food from the areas near the walls of the store. This includes: ? Fresh fruits and vegetables (produce). ? Grains, beans, nuts, and seeds. Some of these may be available in unpackaged forms or large amounts (in bulk). ? Fresh seafood. ? Poultry and eggs. ? Low-fat dairy  products.  Buy whole ingredients instead of prepackaged foods.  Buy fresh fruits and vegetables in-season from local farmers markets.  Buy frozen fruits and vegetables in resealable bags.  If you do not have access to quality fresh seafood, buy precooked frozen shrimp or canned fish, such as tuna, salmon, or sardines.  Buy small amounts of raw or cooked  vegetables, salads, or olives from the deli or salad bar at your store.  Stock your pantry so you always have certain foods on hand, such as olive oil, canned tuna, canned tomatoes, rice, pasta, and beans. Cooking  Cook foods with extra-virgin olive oil instead of using butter or other vegetable oils.  Have meat as a side dish, and have vegetables or grains as your main dish. This means having meat in small portions or adding small amounts of meat to foods like pasta or stew.  Use beans or vegetables instead of meat in common dishes like chili or lasagna.  Experiment with different cooking methods. Try roasting or broiling vegetables instead of steaming or sauteing them.  Add frozen vegetables to soups, stews, pasta, or rice.  Add nuts or seeds for added healthy fat at each meal. You can add these to yogurt, salads, or vegetable dishes.  Marinate fish or vegetables using olive oil, lemon juice, garlic, and fresh herbs. Meal planning   Plan to eat 1 vegetarian meal one day each week. Try to work up to 2 vegetarian meals, if possible.  Eat seafood 2 or more times a week.  Have healthy snacks readily available, such as: ? Vegetable sticks with hummus. ? [place] yogurt. ? Fruit and nut trail mix.  Eat balanced meals throughout the week. This includes: ? Fruit: 2-3 servings a day ? Vegetables: 4-5 servings a day ? Low-fat dairy: 2 servings a day ? Fish, poultry, or lean meat: 1 serving a day ? Beans and legumes: 2 or more servings a week ? Nuts and seeds: 1-2 servings a day ? Whole grains: 6-8 servings a day ? Extra-virgin olive oil: 3-4 servings a day  Limit red meat and sweets to only a few servings a month What are my food choices?  Mediterranean diet ? Recommended  Grains: Whole-grain pasta. Brown rice. Bulgar wheat. Polenta. Couscous. Whole-wheat bread. [place].  Vegetables: Artichokes. Beets. Broccoli. Cabbage. Carrots. Eggplant. Green beans. Chard. Kale.  Spinach. Onions. Leeks. Peas. Squash. Tomatoes. Peppers. Radishes.  Fruits: Apples. Apricots. Avocado. Berries. Bananas. Cherries. Dates. Figs. Grapes. Lemons. Melon. Oranges. Peaches. Plums. Pomegranate.  Meats and other protein foods: Beans. Almonds. Sunflower seeds. Pine nuts. Peanuts. [place]. Salmon. Scallops. Shrimp. [employer]. Tilapia. Clams. Oysters. Eggs.  Dairy: Low-fat milk. Cheese. Greek yogurt.  Beverages: Water. Red wine. Herbal tea.  Fats and oils: Extra virgin olive oil. Avocado oil. Grape seed oil.  Sweets and desserts: [place] yogurt with honey. Baked apples. Poached pears. Trail mix.  Seasoning and other foods: Basil. Cilantro. Coriander. Cumin. Mint. Parsley. Sage. Rosemary. Tarragon. Garlic. Oregano. Thyme. Pepper. Balsalmic vinegar. Tahini. Hummus. Tomato sauce. Olives. Mushrooms. ? Limit these  Grains: Prepackaged pasta or rice dishes. Prepackaged cereal with added sugar.  Vegetables: Deep fried potatoes (french fries).  Fruits: Fruit canned in syrup.  Meats and other protein foods: Beef. Pork. Lamb. Poultry with skin. Hot dogs. Berniece Salines.  Dairy: Ice cream. Sour cream. Whole milk.  Beverages: Juice. Sugar-sweetened soft drinks. Beer. Liquor and spirits.  Fats and oils: Butter. Canola oil. Vegetable oil. Beef fat (tallow). Lard.  Sweets and desserts: Cookies. Cakes. Pies. Candy.  Seasoning and other foods: Mayonnaise. Premade sauces and marinades. The items listed may not be a complete list. Talk with your dietitian about what dietary choices are right for you. Summary  The Mediterranean diet includes both food and lifestyle choices.  Eat a variety of fresh fruits and vegetables, beans, nuts, seeds, and whole grains.  Limit the amount of red meat and sweets that you eat.  Talk with your health care provider about whether it is safe for you to drink red wine in moderation. This means 1 glass a day for nonpregnant women and 2 glasses a day for men. A glass of wine  equals 5 oz (150 mL). This information is not intended to replace advice given to you by your health care provider. Make sure you discuss any questions you have with your health care provider. Document Released: 06/25/2016 Document Revised: 07/02/2016 Document Reviewed: 06/25/2016 Elsevier Patient Education  2020 [employer].

## 2019-07-19 DIAGNOSIS — H04123 Dry eye syndrome of bilateral lacrimal glands: Secondary | ICD-10-CM | POA: Diagnosis not present

## 2019-07-19 DIAGNOSIS — H35033 Hypertensive retinopathy, bilateral: Secondary | ICD-10-CM | POA: Diagnosis not present

## 2019-07-19 DIAGNOSIS — H35372 Puckering of macula, left eye: Secondary | ICD-10-CM | POA: Diagnosis not present

## 2019-07-19 DIAGNOSIS — H40021 Open angle with borderline findings, high risk, right eye: Secondary | ICD-10-CM | POA: Diagnosis not present

## 2019-07-19 DIAGNOSIS — H401121 Primary open-angle glaucoma, left eye, mild stage: Secondary | ICD-10-CM | POA: Diagnosis not present

## 2019-07-27 ENCOUNTER — Other Ambulatory Visit: Payer: Self-pay | Admitting: Internal Medicine

## 2019-08-09 ENCOUNTER — Other Ambulatory Visit: Payer: Self-pay | Admitting: Internal Medicine

## 2019-08-09 ENCOUNTER — Other Ambulatory Visit (HOSPITAL_COMMUNITY): Payer: Self-pay | Admitting: Nurse Practitioner

## 2019-10-18 ENCOUNTER — Other Ambulatory Visit: Payer: Self-pay

## 2019-10-18 DIAGNOSIS — Z20822 Contact with and (suspected) exposure to covid-19: Secondary | ICD-10-CM

## 2019-10-20 LAB — NOVEL CORONAVIRUS, NAA: SARS-CoV-2, NAA: DETECTED — AB

## 2019-10-21 ENCOUNTER — Telehealth: Payer: Self-pay | Admitting: Nurse Practitioner

## 2019-10-21 NOTE — Telephone Encounter (Signed)
  I connected by phone with [name] on 10/21/2019 at 10:42 AM to discuss the potential use of an new treatment for mild to moderate COVID-19 viral infection in non-hospitalized patients.  This patient is a 84 y.o. male that does not meet the FDA criteria for Emergency Use Authorization of bamlanivimab, has had symptoms > 10 days.  Symptoms started prior to Thanksgiving.  Reviewed patient chronic problem list, which is currently managed by their PCP.  I have spoken and communicated the following to the patient or parent/caregiver:  1. FDA has authorized the emergency use of bamlanivimab for the treatment of mild to moderate COVID-19 in adults and pediatric patients with positive results of direct SARS-CoV-2 viral testing who are 37 years of age and older weighing at least 40 kg, and who are at high risk for progressing to severe COVID-19 and/or hospitalization.  2. The significant known and potential risks and benefits of bamlanivimab, and the extent to which such potential risks and benefits are unknown.  3. Information on available alternative treatments and the risks and benefits of those alternatives, including clinical trials.  4. Patients treated with bamlanivimab should continue to self-isolate and use infection control measures (e.g., wear mask, isolate, social distance, avoid sharing personal items, clean and disinfect "high touch" surfaces, and frequent handwashing) according to CDC guidelines.   5. The patient or parent/caregiver has the option to accept or refuse bamlanivimab.  After reviewing this information with the patient we determined he does not meet criteria for use based on symptoms > 10 days.  Have recommended that if worsening symptoms, such as SOB or difficulty breathing, he is to immediately seek care.  At this time is to continue OTC medications, such as diabetic tussin and Claritin for symptoms relief.  If any further questions or concerns he is aware to reach  out to his PCP.  Barbaraann Faster [name] 10/21/2019 10:42 AM

## 2019-10-23 ENCOUNTER — Other Ambulatory Visit: Payer: Self-pay | Admitting: Internal Medicine

## 2019-10-23 MED ORDER — HYDROCODONE-HOMATROPINE 5-1.5 MG/5ML PO SYRP: 5.0000 mL | ORAL_SOLUTION | Freq: Three times a day (TID) | ORAL | 0 refills | Status: DC | PRN

## 2019-10-23 MED ORDER — CEFUROXIME AXETIL 250 MG PO TABS: 250.0000 mg | ORAL_TABLET | Freq: Two times a day (BID) | ORAL | 0 refills | Status: DC

## 2019-10-23 NOTE — Telephone Encounter (Signed)
Pt states he has taken Ibuprofen and Mucinex and felt a little better but when the medicine wears off the SOB and congestion returns to the same as before the medication. Please advise on what the pt can do next?

## 2019-10-23 NOTE — Telephone Encounter (Signed)
Unable to reach pt

## 2019-10-24 ENCOUNTER — Emergency Department (HOSPITAL_COMMUNITY): Payer: Medicare Other

## 2019-10-24 ENCOUNTER — Other Ambulatory Visit: Payer: Self-pay

## 2019-10-24 ENCOUNTER — Inpatient Hospital Stay (HOSPITAL_COMMUNITY)
Admission: EM | Admit: 2019-10-24 | Discharge: 2019-10-28 | DRG: 177 | Disposition: A | Discharge status: Home Health | Payer: Medicare Other | Attending: Internal Medicine | Admitting: Internal Medicine

## 2019-10-24 ENCOUNTER — Encounter (HOSPITAL_COMMUNITY): Payer: Self-pay | Admitting: Emergency Medicine

## 2019-10-24 DIAGNOSIS — Z87891 Personal history of nicotine dependence: Secondary | ICD-10-CM | POA: Diagnosis not present

## 2019-10-24 DIAGNOSIS — R112 Nausea with vomiting, unspecified: Secondary | ICD-10-CM | POA: Diagnosis not present

## 2019-10-24 DIAGNOSIS — Z951 Presence of aortocoronary bypass graft: Secondary | ICD-10-CM

## 2019-10-24 DIAGNOSIS — E785 Hyperlipidemia, unspecified: Secondary | ICD-10-CM | POA: Diagnosis not present

## 2019-10-24 DIAGNOSIS — Z79899 Other long term (current) drug therapy: Secondary | ICD-10-CM | POA: Diagnosis not present

## 2019-10-24 DIAGNOSIS — I10 Essential (primary) hypertension: Secondary | ICD-10-CM | POA: Diagnosis present

## 2019-10-24 DIAGNOSIS — E1169 Type 2 diabetes mellitus with other specified complication: Secondary | ICD-10-CM | POA: Diagnosis present

## 2019-10-24 DIAGNOSIS — Z87442 Personal history of urinary calculi: Secondary | ICD-10-CM

## 2019-10-24 DIAGNOSIS — J1289 Other viral pneumonia: Secondary | ICD-10-CM | POA: Diagnosis present

## 2019-10-24 DIAGNOSIS — Z7901 Long term (current) use of anticoagulants: Secondary | ICD-10-CM

## 2019-10-24 DIAGNOSIS — Z7982 Long term (current) use of aspirin: Secondary | ICD-10-CM | POA: Diagnosis not present

## 2019-10-24 DIAGNOSIS — I480 Paroxysmal atrial fibrillation: Secondary | ICD-10-CM | POA: Diagnosis present

## 2019-10-24 DIAGNOSIS — E119 Type 2 diabetes mellitus without complications: Secondary | ICD-10-CM

## 2019-10-24 DIAGNOSIS — J069 Acute upper respiratory infection, unspecified: Secondary | ICD-10-CM | POA: Diagnosis present

## 2019-10-24 DIAGNOSIS — I2510 Atherosclerotic heart disease of native coronary artery without angina pectoris: Secondary | ICD-10-CM | POA: Diagnosis present

## 2019-10-24 DIAGNOSIS — U071 COVID-19: Principal | ICD-10-CM | POA: Diagnosis present

## 2019-10-24 DIAGNOSIS — J9601 Acute respiratory failure with hypoxia: Secondary | ICD-10-CM | POA: Diagnosis not present

## 2019-10-24 DIAGNOSIS — R0602 Shortness of breath: Secondary | ICD-10-CM | POA: Diagnosis not present

## 2019-10-24 DIAGNOSIS — M1990 Unspecified osteoarthritis, unspecified site: Secondary | ICD-10-CM | POA: Diagnosis present

## 2019-10-24 DIAGNOSIS — E1159 Type 2 diabetes mellitus with other circulatory complications: Secondary | ICD-10-CM | POA: Diagnosis present

## 2019-10-24 DIAGNOSIS — I152 Hypertension secondary to endocrine disorders: Secondary | ICD-10-CM | POA: Diagnosis not present

## 2019-10-24 DIAGNOSIS — Z7984 Long term (current) use of oral hypoglycemic drugs: Secondary | ICD-10-CM | POA: Diagnosis not present

## 2019-10-24 DIAGNOSIS — Z8249 Family history of ischemic heart disease and other diseases of the circulatory system: Secondary | ICD-10-CM | POA: Diagnosis not present

## 2019-10-24 DIAGNOSIS — H9190 Unspecified hearing loss, unspecified ear: Secondary | ICD-10-CM | POA: Diagnosis present

## 2019-10-24 LAB — CBC WITH DIFFERENTIAL/PLATELET
Abs Immature Granulocytes: 0.04 10*3/uL (ref 0.00–0.07)
Basophils Absolute: 0 10*3/uL (ref 0.0–0.1)
Basophils Relative: 0 %
Eosinophils Absolute: 0.1 10*3/uL (ref 0.0–0.5)
Eosinophils Relative: 1 %
HCT: 40.9 % (ref 39.0–52.0)
Hemoglobin: 13 g/dL (ref 13.0–17.0)
Immature Granulocytes: 1 %
Lymphocytes Relative: 18 %
Lymphs Abs: 1 10*3/uL (ref 0.7–4.0)
MCH: 27.7 pg (ref 26.0–34.0)
MCHC: 31.8 g/dL (ref 30.0–36.0)
MCV: 87 fL (ref 80.0–100.0)
Monocytes Absolute: 0.5 10*3/uL (ref 0.1–1.0)
Monocytes Relative: 9 %
Neutro Abs: 4 10*3/uL (ref 1.7–7.7)
Neutrophils Relative %: 71 %
Platelets: 341 10*3/uL (ref 150–400)
RBC: 4.7 MIL/uL (ref 4.22–5.81)
RDW: 14.5 % (ref 11.5–15.5)
WBC: 5.6 10*3/uL (ref 4.0–10.5)
nRBC: 0 % (ref 0.0–0.2)

## 2019-10-24 LAB — COMPREHENSIVE METABOLIC PANEL
ALT: 14 U/L (ref 0–44)
AST: 23 U/L (ref 15–41)
Albumin: 3.4 g/dL — ABNORMAL LOW (ref 3.5–5.0)
Alkaline Phosphatase: 53 U/L (ref 38–126)
Anion gap: 10 (ref 5–15)
BUN: 12 mg/dL (ref 8–23)
CO2: 22 mmol/L (ref 22–32)
Calcium: 8.6 mg/dL — ABNORMAL LOW (ref 8.9–10.3)
Chloride: 103 mmol/L (ref 98–111)
Creatinine, Ser: 0.92 mg/dL (ref 0.61–1.24)
GFR calc Af Amer: >60 mL/min (ref >60)
GFR calc non Af Amer: >60 mL/min (ref >60)
Glucose, Bld: 138 mg/dL — ABNORMAL HIGH (ref 70–99)
Potassium: 3.9 mmol/L (ref 3.5–5.1)
Sodium: 135 mmol/L (ref 135–145)
Total Bilirubin: 0.8 mg/dL (ref 0.3–1.2)
Total Protein: 7.5 g/dL (ref 6.5–8.1)

## 2019-10-24 LAB — LACTATE DEHYDROGENASE: LDH: 269 U/L — ABNORMAL HIGH (ref 98–192)

## 2019-10-24 LAB — CBG MONITORING, ED: Glucose-Capillary: 100 mg/dL — ABNORMAL HIGH (ref 70–99)

## 2019-10-24 LAB — FIBRINOGEN: Fibrinogen: 489 mg/dL — ABNORMAL HIGH (ref 210–475)

## 2019-10-24 LAB — PROCALCITONIN: Procalcitonin: <0.1 ng/mL

## 2019-10-24 LAB — LACTIC ACID, PLASMA
Lactic Acid, Venous: 1.4 mmol/L (ref 0.5–1.9)
Lactic Acid, Venous: 1.4 mmol/L (ref 0.5–1.9)

## 2019-10-24 LAB — D-DIMER, QUANTITATIVE: D-Dimer, Quant: 1.78 ug/mL-FEU — ABNORMAL HIGH (ref 0.00–0.50)

## 2019-10-24 LAB — TRIGLYCERIDES: Triglycerides: 130 mg/dL (ref <150)

## 2019-10-24 LAB — FERRITIN: Ferritin: 24 ng/mL (ref 24–336)

## 2019-10-24 LAB — C-REACTIVE PROTEIN: CRP: 1.2 mg/dL — ABNORMAL HIGH (ref <1.0)

## 2019-10-24 MED ORDER — SODIUM CHLORIDE 0.9 % IV SOLN
200.0000 mg | Freq: Once | INTRAVENOUS | Status: AC
  Administered 2019-10-24: 200 mg via INTRAVENOUS
  Filled 2019-10-24: qty 200

## 2019-10-24 MED ORDER — ACETAMINOPHEN 325 MG PO TABS: 650.0000 mg | ORAL_TABLET | Freq: Four times a day (QID) | ORAL | Status: DC | PRN

## 2019-10-24 MED ORDER — HYDROCOD POLST-CPM POLST ER 10-8 MG/5ML PO SUER: 5.0000 mL | Freq: Two times a day (BID) | ORAL | Status: DC | PRN

## 2019-10-24 MED ORDER — ZINC SULFATE 220 (50 ZN) MG PO CAPS
220.0000 mg | ORAL_CAPSULE | Freq: Every day | ORAL | Status: DC
  Administered 2019-10-25 – 2019-10-28 (×4): 220 mg via ORAL
  Filled 2019-10-24 (×3): qty 1

## 2019-10-24 MED ORDER — IPRATROPIUM BROMIDE HFA 17 MCG/ACT IN AERS
2.0000 | INHALATION_SPRAY | Freq: Four times a day (QID) | RESPIRATORY_TRACT | Status: DC
  Administered 2019-10-25 – 2019-10-28 (×13): 2 via RESPIRATORY_TRACT
  Filled 2019-10-24: qty 12.9

## 2019-10-24 MED ORDER — SIMVASTATIN 20 MG PO TABS
10.0000 mg | ORAL_TABLET | Freq: Every day | ORAL | Status: DC
  Administered 2019-10-25 – 2019-10-28 (×4): 10 mg via ORAL
  Filled 2019-10-24 (×4): qty 1

## 2019-10-24 MED ORDER — ONDANSETRON HCL 4 MG/2ML IJ SOLN: 4.0000 mg | Freq: Four times a day (QID) | INTRAMUSCULAR | Status: DC | PRN

## 2019-10-24 MED ORDER — AMIODARONE HCL 200 MG PO TABS
200.0000 mg | ORAL_TABLET | Freq: Every day | ORAL | Status: DC
  Administered 2019-10-25 – 2019-10-28 (×4): 200 mg via ORAL
  Filled 2019-10-24: qty 1
  Filled 2019-10-24 (×2): qty 2
  Filled 2019-10-24: qty 1

## 2019-10-24 MED ORDER — DEXAMETHASONE SODIUM PHOSPHATE 10 MG/ML IJ SOLN
6.0000 mg | INTRAMUSCULAR | Status: DC
  Administered 2019-10-25 – 2019-10-28 (×4): 6 mg via INTRAVENOUS
  Filled 2019-10-24 (×5): qty 1

## 2019-10-24 MED ORDER — ONDANSETRON HCL 4 MG/2ML IJ SOLN
4.0000 mg | Freq: Once | INTRAMUSCULAR | Status: AC
  Administered 2019-10-24: 4 mg via INTRAVENOUS
  Filled 2019-10-24: qty 2

## 2019-10-24 MED ORDER — VITAMIN C 500 MG PO TABS
500.0000 mg | ORAL_TABLET | Freq: Every day | ORAL | Status: DC
  Administered 2019-10-25 – 2019-10-28 (×4): 500 mg via ORAL
  Filled 2019-10-24 (×3): qty 1

## 2019-10-24 MED ORDER — INSULIN ASPART 100 UNIT/ML ~~LOC~~ SOLN
0.0000 [IU] | Freq: Every day | SUBCUTANEOUS | Status: DC
  Administered 2019-10-26 – 2019-10-27 (×2): 2 [IU] via SUBCUTANEOUS
  Filled 2019-10-24: qty 0.05

## 2019-10-24 MED ORDER — ASPIRIN 81 MG PO CHEW
81.0000 mg | CHEWABLE_TABLET | Freq: Every day | ORAL | Status: DC
  Administered 2019-10-25 – 2019-10-28 (×4): 81 mg via ORAL
  Filled 2019-10-24 (×4): qty 1

## 2019-10-24 MED ORDER — GABAPENTIN 100 MG PO CAPS
100.0000 mg | ORAL_CAPSULE | Freq: Three times a day (TID) | ORAL | Status: DC
  Administered 2019-10-24 – 2019-10-28 (×11): 100 mg via ORAL
  Filled 2019-10-24 (×11): qty 1

## 2019-10-24 MED ORDER — SODIUM CHLORIDE 0.9 % IV SOLN
100.0000 mg | Freq: Every day | INTRAVENOUS | Status: AC
  Administered 2019-10-25 – 2019-10-28 (×4): 100 mg via INTRAVENOUS
  Filled 2019-10-24 (×4): qty 20

## 2019-10-24 MED ORDER — GUAIFENESIN-DM 100-10 MG/5ML PO SYRP: 10.0000 mL | ORAL_SOLUTION | ORAL | Status: DC | PRN

## 2019-10-24 MED ORDER — INSULIN ASPART 100 UNIT/ML ~~LOC~~ SOLN
0.0000 [IU] | Freq: Three times a day (TID) | SUBCUTANEOUS | Status: DC
  Administered 2019-10-25 (×2): 2 [IU] via SUBCUTANEOUS
  Administered 2019-10-25: 1 [IU] via SUBCUTANEOUS
  Administered 2019-10-26: 5 [IU] via SUBCUTANEOUS
  Administered 2019-10-26: 2 [IU] via SUBCUTANEOUS
  Administered 2019-10-26 – 2019-10-27 (×3): 3 [IU] via SUBCUTANEOUS
  Administered 2019-10-27: 2 [IU] via SUBCUTANEOUS
  Filled 2019-10-24: qty 0.09

## 2019-10-24 MED ORDER — METOPROLOL SUCCINATE ER 25 MG PO TB24
25.0000 mg | ORAL_TABLET | Freq: Every day | ORAL | Status: DC
  Administered 2019-10-25 – 2019-10-28 (×4): 25 mg via ORAL
  Filled 2019-10-24 (×4): qty 1

## 2019-10-24 MED ORDER — ONDANSETRON HCL 4 MG PO TABS: 4.0000 mg | ORAL_TABLET | Freq: Four times a day (QID) | ORAL | Status: DC | PRN

## 2019-10-24 MED ORDER — APIXABAN 5 MG PO TABS
5.0000 mg | ORAL_TABLET | Freq: Two times a day (BID) | ORAL | Status: DC
  Administered 2019-10-24 – 2019-10-28 (×8): 5 mg via ORAL
  Filled 2019-10-24 (×8): qty 1

## 2019-10-24 MED ORDER — DEXAMETHASONE SODIUM PHOSPHATE 10 MG/ML IJ SOLN
6.0000 mg | Freq: Once | INTRAMUSCULAR | Status: AC
  Administered 2019-10-24: 6 mg via INTRAVENOUS
  Filled 2019-10-24: qty 1

## 2019-10-24 NOTE — ED Notes (Signed)
Pt placed in a gown and hooked up to the monitor. Pt provided with a urinal and both phone and call bell are within reach. The pt was instructed how to use and told to call out if he needed anything.

## 2019-10-24 NOTE — ED Provider Notes (Addendum)
[facility] DEPT Provider Note   CSN: [id number] Arrival date & time: 10/24/19  1951     History   Chief Complaint Chief Complaint  Patient presents with  . COVID +  . Shortness of Breath  . Nausea  . Emesis    HPI [name] is a 84 y.o. male.     HPI Patient presents emergency room for evaluation of shortness of breath.  Patient states he tested positive for Covid about 1 week ago.  Patient has been managing his symptoms at home.  He has been coughing but does not think he has had any fevers.  Today he had some episodes of nausea and vomiting after taking some vitamin supplements meant to help him with the Covid infection.  Patient also has been feeling more and more short of breath.    No diarrhea.  No chest pain.  Patient is Dr. Prescribed an antibiotic and a cough syrup yesterday.  He also was taking zinc and vitamin C. Past Medical History:  Diagnosis Date  . Arthritis   . Atrial fibrillation ([place])    post op; amiodarone and coumadin continued for 3 mos post op  . CARCINOMA, SKIN, SQUAMOUS CELL 01/07/2010  . Cataract    beginning of cataracts  . COLONIC POLYPS, HX OF 04/26/2007  . Coronary artery disease    s/p CABG 3/12: L-LAD, S-CFX (Dr. Roxy Manns); EF 45% at cath prior to CABG  . DIABETES MELLITUS, TYPE II 04/26/2007  . Hyperlipidemia   . HYPERTENSION 04/26/2007  . NEPHROLITHIASIS, HX OF 04/26/2007  . SBO (small bowel obstruction) ([place]) 10/2015   Partial   . Shortness of breath dyspnea    with exertion  . Unspecified hearing loss 12/18/2008    Patient Active Problem List   Diagnosis Date Noted  . Near syncope 04/12/2019  . Dysphagia 02/21/2019  . Dyspnea 02/21/2019  . History of total knee replacement, left 11/02/2018  . Spinal stenosis of lumbar region 10/03/2018  . Diabetic neuropathy ([place]) 07/05/2018  . RUQ abdominal pain 04/04/2018  . Vertigo 04/04/2018  . Well adult exam 07/02/2017  . Abdominal pain 07/02/2017  .  Constipation 02/18/2016  . Small bowel obstruction ([place]) 11/06/2015  . Back pain 05/08/2015  . Cough 10/17/2014  . Dyslipidemia 02/09/2011  . CAD (coronary artery disease), native coronary artery 02/09/2011  . Afib ([place]) 01/20/2011  . CARCINOMA, SKIN, SQUAMOUS CELL 01/07/2010  . UNSPECIFIED HEARING LOSS 12/18/2008  . Diabetes mellitus, type 2 ([place]) 04/26/2007  . Essential hypertension 04/26/2007  . COLONIC POLYPS, HX OF 04/26/2007  . NEPHROLITHIASIS, HX OF 04/26/2007    Past Surgical History:  Procedure Laterality Date  . BACK SURGERY     disectomy  . CARDIOVERSION N/A 06/08/2018   Procedure: CARDIOVERSION;  Surgeon: Buford Dresser, MD;  Location: [facility];  Service: Cardiovascular;  Laterality: N/A;  . CARDIOVERSION N/A 07/25/2018   Procedure: CARDIOVERSION;  Surgeon: Fay Records, MD;  Location: [facility];  Service: Cardiovascular;  Laterality: N/A;  . COLONOSCOPY    . CORONARY ARTERY BYPASS GRAFT  01/12/2011  . LUMBAR LAMINECTOMY/DECOMPRESSION MICRODISCECTOMY N/A 05/08/2015   Procedure: LUMBAR LAMINECTOMY/DECOMPRESSION MICRODISCECTOMY 1 LEVEL Lumbar four five;  Surgeon: Melina Schools, MD;  Location: [facility];  Service: Orthopedics;  Laterality: N/A;  . TOTAL HIP ARTHROPLASTY Left    left on 04/15/07  . TYMPANIC MEMBRANE REPAIR  1981        Home Medications    Prior to Admission medications   Medication Sig  Start Date End Date Taking? Authorizing Provider  amiodarone (PACERONE) 200 MG tablet TAKE 1 TABLET BY MOUTH EVERY DAY 08/10/19   Fay Records, MD  aspirin 81 MG tablet Take 81 mg by mouth daily.     [provider]  cefUROXime (CEFTIN) 250 MG tablet Take 1 tablet (250 mg total) by mouth 2 (two) times daily. 10/23/19   [name], Evie Lacks, MD  cholecalciferol (VITAMIN D) 1000 units tablet Take 1,000 Units by mouth 2 (two) times daily.    [provider]  ELIQUIS 5 MG TABS tablet TAKE 1 TABLET BY MOUTH TWICE A DAY 02/06/19   Sherran Needs, NP   gabapentin (NEURONTIN) 100 MG capsule Take 1 capsule (100 mg total) by mouth 3 (three) times daily. Office visit needed before refills will be given 08/09/19   [name], Evie Lacks, MD  HYDROcodone-homatropine (HYCODAN) 5-1.5 MG/5ML syrup Take 5 mLs by mouth every 8 (eight) hours as needed for cough. 10/23/19   [name], Evie Lacks, MD  metFORMIN (GLUCOPHAGE) 500 MG tablet Take 1 tablet (500 mg total) by mouth 3 (three) times daily. Office visit needed before refills will be given 08/09/19   [name], Evie Lacks, MD  metoprolol succinate (TOPROL-XL) 25 MG 24 hr tablet TAKE 1 TABLET BY MOUTH EVERY DAY 07/27/19   Fay Records, MD  pantoprazole (PROTONIX) 40 MG tablet Take 1 tablet (40 mg total) by mouth daily. 02/21/19   [name], Evie Lacks, MD  Propylene Glycol (SYSTANE BALANCE) 0.6 % SOLN Place 1 drop into both eyes 2 (two) times daily as needed (for dry eyes).    [provider]  repaglinide (PRANDIN) 2 MG tablet TAKE 1 TABLET (2 MG TOTAL) BY MOUTH 3 (THREE) TIMES DAILY BEFORE MEALS. 06/16/19   [name], Evie Lacks, MD  simvastatin (ZOCOR) 10 MG tablet Take 1 tablet (10 mg total) by mouth daily. 01/04/19   Fay Records, MD    Family History Family History  Problem Relation Age of Onset  . Heart failure Mother   . Heart disease Father   . Cancer Sister   . Cancer Brother   . Heart disease Brother   . Breast cancer Sister   . Colon cancer Neg Hx   . Esophageal cancer Neg Hx   . Rectal cancer Neg Hx   . Stomach cancer Neg Hx     Social History Social History   Tobacco Use  . Smoking status: Former Smoker    Quit date: 01/02/1981    Years since quitting: 38.8  . Smokeless tobacco: Never Used  Substance Use Topics  . Alcohol use: No    Alcohol/week: 0.0 standard drinks  . Drug use: No     Allergies   Patient has no known allergies.   Review of Systems Review of Systems  All other systems reviewed and are negative.    Physical Exam Updated Vital Signs BP (!) 163/81    Pulse 73   Temp 98.2 F (36.8 C) (Oral)   Resp (!) 23   SpO2 92%   Physical Exam Vitals signs and nursing note reviewed.  Constitutional:      General: He is not in acute distress.    Appearance: He is well-developed.  HENT:     Head: Normocephalic and atraumatic.     Right Ear: External ear normal.     Left Ear: External ear normal.  Eyes:     General: No scleral icterus.       Right eye: No  discharge.        Left eye: No discharge.     Conjunctiva/sclera: Conjunctivae normal.  Neck:     Musculoskeletal: Neck supple.     Trachea: No tracheal deviation.  Cardiovascular:     Rate and Rhythm: Normal rate and regular rhythm.  Pulmonary:     Effort: Pulmonary effort is normal. No respiratory distress.     Breath sounds: Normal breath sounds. No stridor. No wheezing or rales.  Abdominal:     General: Bowel sounds are normal. There is no distension.     Palpations: Abdomen is soft.     Tenderness: There is no abdominal tenderness. There is no guarding or rebound.  Musculoskeletal:        General: No tenderness.  Skin:    General: Skin is warm and dry.     Findings: No rash.  Neurological:     Mental Status: He is alert.     Cranial Nerves: No cranial nerve deficit (no facial droop, extraocular movements intact, no slurred speech).     Sensory: No sensory deficit.     Motor: No abnormal muscle tone or seizure activity.     Coordination: Coordination normal.      ED Treatments / Results  Labs (all labs ordered are listed, but only abnormal results are displayed) Labs Reviewed  COMPREHENSIVE METABOLIC PANEL - Abnormal; Notable for the following components:      Result Value   Glucose, Bld 138 (*)    Calcium 8.6 (*)    Albumin 3.4 (*)    All other components within normal limits  D-DIMER, QUANTITATIVE (NOT AT [facility]) - Abnormal; Notable for the following components:   D-Dimer, Quant 1.78 (*)    All other components within normal limits  LACTATE DEHYDROGENASE -  Abnormal; Notable for the following components:   LDH 269 (*)    All other components within normal limits  FIBRINOGEN - Abnormal; Notable for the following components:   Fibrinogen 489 (*)    All other components within normal limits  C-REACTIVE PROTEIN - Abnormal; Notable for the following components:   CRP 1.2 (*)    All other components within normal limits  CULTURE, BLOOD (ROUTINE X 2)  CULTURE, BLOOD (ROUTINE X 2)  LACTIC ACID, PLASMA  CBC WITH DIFFERENTIAL/PLATELET  FERRITIN  TRIGLYCERIDES  LACTIC ACID, PLASMA  PROCALCITONIN    EKG EKG Interpretation  Date/Time:  Tuesday October 24 2019 20:15:02 EST Ventricular Rate:  72 PR Interval:    QRS Duration: 102 QT Interval:  442 QTC Calculation: 484 R Axis:   -5 Text Interpretation: Sinus rhythm Ventricular premature complex Prolonged PR interval Anterior infarct, old No significant change since last tracing Confirmed by Dorie Rank [phone]) on 10/24/2019 8:26:01 PM   Radiology Dg Chest Port 1 View  Result Date: 10/24/2019 CLINICAL DATA:  Dyspnea, COVID-19 positive EXAM: PORTABLE CHEST 1 VIEW COMPARISON:  CTA chest and radiograph 05/10/2018 FINDINGS: Postsurgical changes related to prior CABG including intact and aligned sternotomy wires and multiple surgical clips projecting over the mediastinum. Cardiomegaly is similar to comparison. Widespread patchy peripheral and basilar predominant airspace and interstitial opacities. No pneumothorax. Small right pleural effusion. No visible left effusion the portion of the costophrenic sulcus is collimated. No acute osseous or soft tissue abnormality. Degenerative changes are present in the imaged spine and shoulders. IMPRESSION: 1. Widespread patchy peripheral and basilar predominant airspace opacities compatible with multifocal pneumonia including possible COVID-19 etiology. 2. Small right pleural effusion. 3. Stable cardiomegaly.  Post  CABG changes. Electronically Signed   By: Lovena Le  M.D.   On: 10/24/2019 20:53    Procedures Procedures (including critical care time)  Medications Ordered in ED Medications  dexamethasone (DECADRON) injection 6 mg (has no administration in time range)  ondansetron (ZOFRAN) injection 4 mg (4 mg Intravenous Given 10/24/19 2136)     Initial Impression / Assessment and Plan / ED Course  I have reviewed the triage vital signs and the nursing notes.  Pertinent labs & imaging results that were available during my care of the patient were reviewed by me and considered in my medical decision making (see chart for details).  Clinical Course as of Oct 23 2154  Tue Oct 24, 2019  2025 Oxygen saturation in the mid 90s without any supplemental oxygen at rest   [JK]  2114 Chest x-ray shows widespread multifocal pneumonia.  Consistent with Covid pneumonia.  Inflammatory markers elevated.   [JK]  2152 Patient remains tachypneic.    [JK]    Clinical Course User Index [JK] Dorie Rank, MD     Patient presents with worsening shortness of breath associated with known Covid infection.  Patient has elevated inflammatory markers consistent with his Covid illness.  He has borderline oxygen saturation at 92% at rest associated with tachypneic and increased work of breathing.  Chest x-ray shows diffuse pneumonia.  Considering his worsening respiratory status and pneumonia on x-ray will consult the medical service regular admission for further treatment.   [name] was evaluated in Emergency Department on 10/24/2019 for the symptoms described in the history of present illness. He was evaluated in the context of the global COVID-19 pandemic, which necessitated consideration that the patient might be at risk for infection with the SARS-CoV-2 virus that causes COVID-19. Institutional protocols and algorithms that pertain to the evaluation of patients at risk for COVID-19 are in a state of rapid change based on information released by regulatory bodies  including the CDC and federal and state organizations. These policies and algorithms were followed during the patient's care in the ED. Final Clinical Impressions(s) / ED Diagnoses   Final diagnoses:  Pneumonia due to COVID-19 virus        Dorie Rank, MD 10/24/19 2215

## 2019-10-24 NOTE — ED Notes (Signed)
Pt placed on 2L [place]. Pt 87% on RA at rest. Page sent to Dr. Posey Pronto to notify.

## 2019-10-24 NOTE — Progress Notes (Signed)
Pharmacy: Remdesivir   Patient is a 84 y.o. male with COVID.  Pharmacy has been consulted for remdesivir dosing.   - ALT: 14 - CXR shows:  Widespread patchy peripheral and basilar predominant airspace opacities compatible with multifocal pneumonia including possible COVID-19 etiology. - Pt is requiring supplemental oxygen: (yes)    A/P:  - Patient meets criteria for remdesivir. Will initiate remdesivir 200 mg once followed by 100 mg daily x 4 days.  - Daily CMET while on remdesivir - Will f/u pt's ALT and clinical condition  Leone Haven, PharmD

## 2019-10-24 NOTE — H&P (Signed)
History and Physical    [name] TWS:[id number] DOB: 1935/09/08 DOA: 10/24/2019  PCP: Cassandria Anger, MD  Patient coming from: Home  I have personally briefly reviewed patient's old medical records in [place]  Chief Complaint: Shortness of breath, COVID-19 positive  HPI: [name] is a 84 y.o. male with medical history significant for CAD s/p CABG, paroxysmal atrial fibrillation on Eliquis, type 2 diabetes, hypertension, and hyperlipidemia who presents to the ED for evaluation of progressive shortness of breath.  Patient recently tested positive for COVID-19 with positive SARS-CoV-2 NAA test on 10/18/2019.  He has been attempting to manage his at home.  Yesterday his PCP prescribed him antibiotic therapy with Ceftin and was advised to start taking vitamin C and zinc.  Patient states he has had continued dyspnea at rest, nasal and chest congestion, cough occasionally productive of yellow/brown sputum, fatigue, and feeling dehydrated.  He had an episode of nausea with vomiting today.  He denies any chest pain, palpitations, subjective fevers, chills, diaphoresis, abdominal pain, diarrhea, or dysuria.  He is on Eliquis for history of atrial fibrillation.  He denies any obvious bleeding.  He says his wife has also been dealing with the COVID-19 infection and was recently hospitalized at [facility].  ED Course:  Initial vitals showed BP 160/77, pulse 69, RR 14, temp 98.2 Fahrenheit, SPO2 91-95% on room air.  Patient later desaturated to 87% on room air.  He was placed on 2 L supplemental O2 via [place] with improvement to 99% SPO2.  Labs notable for WBC 5.6, hemoglobin 13.0, platelets 341,000, sodium 135, potassium 3.9, bicarb 22, BUN 12, creatinine 0.92, AST 23, ALT 14, alk phos 53, total bilirubin 0.8, D-dimer 1.78, LDH 269, ferritin 24, fibrinogen 49, CRP 1.2, triglycerides 130.  Blood cultures were obtained and pending.  Portable chest x-ray shows  widespread patchy opacities suggestive of multifocal pneumonia.  Prior CABG changes are noted.  Patient was ordered to receive IV Decadron 6 mg once and the hospitalist service was consulted admit for further evaluation and management.  Review of Systems: All systems reviewed and are negative except as documented in history of present illness above.   Past Medical History:  Diagnosis Date  . Arthritis   . Atrial fibrillation ([place])    post op; amiodarone and coumadin continued for 3 mos post op  . CARCINOMA, SKIN, SQUAMOUS CELL 01/07/2010  . Cataract    beginning of cataracts  . COLONIC POLYPS, HX OF 04/26/2007  . Coronary artery disease    s/p CABG 3/12: L-LAD, S-CFX (Dr. Roxy Manns); EF 45% at cath prior to CABG  . DIABETES MELLITUS, TYPE II 04/26/2007  . Hyperlipidemia   . HYPERTENSION 04/26/2007  . NEPHROLITHIASIS, HX OF 04/26/2007  . SBO (small bowel obstruction) ([place]) 10/2015   Partial   . Shortness of breath dyspnea    with exertion  . Unspecified hearing loss 12/18/2008    Past Surgical History:  Procedure Laterality Date  . BACK SURGERY     disectomy  . CARDIOVERSION N/A 06/08/2018   Procedure: CARDIOVERSION;  Surgeon: Buford Dresser, MD;  Location: [facility] ENDOSCOPY;  Service: Cardiovascular;  Laterality: N/A;  . CARDIOVERSION N/A 07/25/2018   Procedure: CARDIOVERSION;  Surgeon: Fay Records, MD;  Location: [place];  Service: Cardiovascular;  Laterality: N/A;  . COLONOSCOPY    . CORONARY ARTERY BYPASS GRAFT  01/12/2011  . LUMBAR LAMINECTOMY/DECOMPRESSION MICRODISCECTOMY N/A 05/08/2015   Procedure: LUMBAR LAMINECTOMY/DECOMPRESSION MICRODISCECTOMY 1 LEVEL Lumbar four five;  Surgeon: Melina Schools, MD;  Location: [place];  Service: Orthopedics;  Laterality: N/A;  . TOTAL HIP ARTHROPLASTY Left    left on 04/15/07  . TYMPANIC MEMBRANE REPAIR  1981    Social History:  reports that he quit smoking about 38 years ago. He has never used smokeless tobacco. He reports that he does  not drink alcohol or use drugs.  No Known Allergies  Family History  Problem Relation Age of Onset  . Heart failure Mother   . Heart disease Father   . Cancer Sister   . Cancer Brother   . Heart disease Brother   . Breast cancer Sister   . Colon cancer Neg Hx   . Esophageal cancer Neg Hx   . Rectal cancer Neg Hx   . Stomach cancer Neg Hx      Prior to Admission medications   Medication Sig Start Date End Date Taking? Authorizing Provider  amiodarone (PACERONE) 200 MG tablet TAKE 1 TABLET BY MOUTH EVERY DAY 08/10/19   Fay Records, MD  aspirin 81 MG tablet Take 81 mg by mouth daily.     [provider]  cefUROXime (CEFTIN) 250 MG tablet Take 1 tablet (250 mg total) by mouth 2 (two) times daily. 10/23/19   [name], Evie Lacks, MD  cholecalciferol (VITAMIN D) 1000 units tablet Take 1,000 Units by mouth 2 (two) times daily.    [provider]  ELIQUIS 5 MG TABS tablet TAKE 1 TABLET BY MOUTH TWICE A DAY 02/06/19   Sherran Needs, NP  gabapentin (NEURONTIN) 100 MG capsule Take 1 capsule (100 mg total) by mouth 3 (three) times daily. Office visit needed before refills will be given 08/09/19   [name], Evie Lacks, MD  HYDROcodone-homatropine (HYCODAN) 5-1.5 MG/5ML syrup Take 5 mLs by mouth every 8 (eight) hours as needed for cough. 10/23/19   [name], Evie Lacks, MD  metFORMIN (GLUCOPHAGE) 500 MG tablet Take 1 tablet (500 mg total) by mouth 3 (three) times daily. Office visit needed before refills will be given 08/09/19   [name], Evie Lacks, MD  metoprolol succinate (TOPROL-XL) 25 MG 24 hr tablet TAKE 1 TABLET BY MOUTH EVERY DAY 07/27/19   Fay Records, MD  pantoprazole (PROTONIX) 40 MG tablet Take 1 tablet (40 mg total) by mouth daily. 02/21/19   [name], Evie Lacks, MD  Propylene Glycol (SYSTANE BALANCE) 0.6 % SOLN Place 1 drop into both eyes 2 (two) times daily as needed (for dry eyes).    [provider]  repaglinide (PRANDIN) 2 MG tablet TAKE 1 TABLET (2 MG  TOTAL) BY MOUTH 3 (THREE) TIMES DAILY BEFORE MEALS. 06/16/19   [name], Evie Lacks, MD  simvastatin (ZOCOR) 10 MG tablet Take 1 tablet (10 mg total) by mouth daily. 01/04/19   Fay Records, MD    Physical Exam: Vitals:   10/24/19 2030 10/24/19 2100 10/24/19 2130 10/24/19 2200  BP: (!) 162/69 (!) 160/77 (!) 163/81 (!) 175/88  Pulse: 63 68 73 67  Resp: (!) 23 14 (!) 23 (!) 25  Temp:      TempSrc:      SpO2: 93% 95% 92% 91%    Constitutional: Resting supine in bed with head elevated, NAD, calm, appears tired but comfortable Eyes: PERRL, lids and conjunctivae normal ENMT: Mucous membranes are moist. Posterior pharynx clear of any exudate or lesions.Normal dentition.  Neck: normal, supple, no masses. Respiratory: Faint inspiratory crackles bilateral lower lung fields. Normal respiratory effort. No accessory muscle use.  Cardiovascular: Regular rate and rhythm, no murmurs / rubs / gallops. No extremity edema. 2+ pedal pulses. Abdomen: no tenderness, no masses palpated. No hepatosplenomegaly. Bowel sounds positive.  Musculoskeletal: no clubbing / cyanosis. No joint deformity upper and lower extremities. Good ROM, no contractures. Normal muscle tone.  Skin: no rashes, lesions, ulcers. No induration Neurologic: CN 2-12 grossly intact. Sensation intact, Strength 5/5 in all 4.  Psychiatric: Normal judgment and insight. Alert and oriented x 3. Normal mood.     Labs on Admission: I have personally reviewed following labs and imaging studies  CBC: Recent Labs  Lab 10/24/19 2027  WBC 5.6  NEUTROABS 4.0  HGB 13.0  HCT 40.9  MCV 87.0  PLT 496   Basic Metabolic Panel: Recent Labs  Lab 10/24/19 2027  NA 135  K 3.9  CL 103  CO2 22  GLUCOSE 138*  BUN 12  CREATININE 0.92  CALCIUM 8.6*   GFR: CrCl cannot be calculated (Unknown ideal weight.). Liver Function Tests: Recent Labs  Lab 10/24/19 2027  AST 23  ALT 14  ALKPHOS 53  BILITOT 0.8  PROT 7.5  ALBUMIN 3.4*   No results  for input(s): LIPASE, AMYLASE in the last 168 hours. No results for input(s): AMMONIA in the last 168 hours. Coagulation Profile: No results for input(s): INR, PROTIME in the last 168 hours. Cardiac Enzymes: No results for input(s): CKTOTAL, CKMB, CKMBINDEX, TROPONINI in the last 168 hours. BNP (last 3 results) No results for input(s): PROBNP in the last 8760 hours. HbA1C: No results for input(s): HGBA1C in the last 72 hours. CBG: No results for input(s): GLUCAP in the last 168 hours. Lipid Profile: Recent Labs    10/24/19 2027  TRIG 130   Thyroid Function Tests: No results for input(s): TSH, T4TOTAL, FREET4, T3FREE, THYROIDAB in the last 72 hours. Anemia Panel: Recent Labs    10/24/19 2027  FERRITIN 24   Urine analysis:    Component Value Date/Time   COLORURINE YELLOW 01/28/2019 0006   APPEARANCEUR CLEAR 01/28/2019 0006   LABSPEC 1.020 01/28/2019 0006   PHURINE 7.0 01/28/2019 0006   GLUCOSEU 50 (A) 01/28/2019 0006   GLUCOSEU NEGATIVE 04/04/2018 1416   HGBUR SMALL (A) 01/28/2019 0006   BILIRUBINUR NEGATIVE 01/28/2019 0006   BILIRUBINUR n 12/26/2010   KETONESUR 5 (A) 01/28/2019 0006   PROTEINUR 30 (A) 01/28/2019 0006   UROBILINOGEN 0.2 04/04/2018 1416   NITRITE NEGATIVE 01/28/2019 0006   LEUKOCYTESUR TRACE (A) 01/28/2019 0006    Radiological Exams on Admission: Dg Chest Port 1 View  Result Date: 10/24/2019 CLINICAL DATA:  Dyspnea, COVID-19 positive EXAM: PORTABLE CHEST 1 VIEW COMPARISON:  CTA chest and radiograph 05/10/2018 FINDINGS: Postsurgical changes related to prior CABG including intact and aligned sternotomy wires and multiple surgical clips projecting over the mediastinum. Cardiomegaly is similar to comparison. Widespread patchy peripheral and basilar predominant airspace and interstitial opacities. No pneumothorax. Small right pleural effusion. No visible left effusion the portion of the costophrenic sulcus is collimated. No acute osseous or soft tissue  abnormality. Degenerative changes are present in the imaged spine and shoulders. IMPRESSION: 1. Widespread patchy peripheral and basilar predominant airspace opacities compatible with multifocal pneumonia including possible COVID-19 etiology. 2. Small right pleural effusion. 3. Stable cardiomegaly.  Post CABG changes. Electronically Signed   By: Lovena Le M.D.   On: 10/24/2019 20:53    EKG: Independently reviewed.  Normal sinus rhythm, first-degree AV block, PVC.  PVC is new when compared to prior.  Assessment/Plan Principal Problem:  Acute respiratory disease due to COVID-19 virus Active Problems:   Diabetes mellitus, type 2 (HCC)   Hypertension associated with diabetes ([place])   Paroxysmal atrial fibrillation (HCC)   Hyperlipidemia associated with type 2 diabetes mellitus ([place])   CAD (coronary artery disease), native coronary artery   Acute respiratory failure with hypoxia (HCC)  [name] is a 84 y.o. male with medical history significant for CAD s/p CABG, paroxysmal atrial fibrillation on Eliquis, type 2 diabetes, hypertension, and hyperlipidemia who is admitted with acute respiratory failure with hypoxia due to COVID-19 viral infection with multifocal pneumonia.  Acute respiratory failure with hypoxia due to COVID-19 viral infection with multifocal pneumonia: SARS-CoV-2 NAAT positive 10/18/2019.  He is hypoxic to 88% on RA while at rest, currently requiring 2 L supplemental O2 via [place]. Chest x-ray findings consistent with multifocal pneumonia.  Inflammatory markers are overall reassuring, procalcitonin is undetectable. -Admit to [facility], currently no beds available here at [facility] long hospital -Start IV remdesivir per pharmacy protocol -Continue IV dexamethasone 6 mg daily -Continue supplemental O2 as needed -Continue incentive spirometer, flutter valve -Continue antitussives, antiemetics as needed, vitamin C, and zinc  Paroxysmal atrial fibrillation:  CHA2DS2-VASc score is 5.  He is in sinus rhythm at time of admission. -Continue Eliquis -Continue Toprol-XL -Continue amiodarone -monitor closely with acute pulmonary infiltrates  CAD s/p CABG: Chronic and stable, denies any chest pain. -Continue aspirin 81 mg daily -Continue Toprol-XL -Continue simvastatin  Type 2 diabetes: On Metformin and repaglinide as an outpatient. -Will use sensitive SSI w/ HS coverage while in hospital, adjust as needed  Hypertension: -Continue home Toprol-XL  Hyperlipidemia: -Continue simvastatin  DVT prophylaxis: Eliquis Code Status: Partial code/DO NOT INTUBATE, confirmed with patient Family Communication: Discussed with patient, he has discussed with family Disposition Plan: Admit to [place] called: None Admission status: Inpatient for management of acute respiratory failure with hypoxia due to COVID-19 viral infection associated with multifocal pneumonia.  Anticipate at least 5-day inpatient stay for antiviral and steroid therapy.   Zada Finders MD [facility]  If 7PM-7AM, please contact night-coverage [email or web address]  10/24/2019, 11:05 PM

## 2019-10-24 NOTE — ED Triage Notes (Signed)
Patient here from home with complaints of SOB. Reports he tested positive for COVID last Wednesday. Nausea, vomiting that started around 6pm after taking meds.

## 2019-10-24 NOTE — ED Notes (Signed)
[facility] called for transport 

## 2019-10-25 ENCOUNTER — Other Ambulatory Visit: Payer: Self-pay

## 2019-10-25 LAB — HEMOGLOBIN A1C
Hgb A1c MFr Bld: 6.5 % — ABNORMAL HIGH (ref 4.8–5.6)
Mean Plasma Glucose: 139.85 mg/dL

## 2019-10-25 LAB — GLUCOSE, CAPILLARY
Glucose-Capillary: 146 mg/dL — ABNORMAL HIGH (ref 70–99)
Glucose-Capillary: 189 mg/dL — ABNORMAL HIGH (ref 70–99)
Glucose-Capillary: 180 mg/dL — ABNORMAL HIGH (ref 70–99)
Glucose-Capillary: 184 mg/dL — ABNORMAL HIGH (ref 70–99)

## 2019-10-25 LAB — CBC WITH DIFFERENTIAL/PLATELET
Abs Immature Granulocytes: 0.01 10*3/uL (ref 0.00–0.07)
Basophils Absolute: 0 10*3/uL (ref 0.0–0.1)
Basophils Relative: 0 %
Eosinophils Absolute: 0 10*3/uL (ref 0.0–0.5)
Eosinophils Relative: 0 %
HCT: 36.5 % — ABNORMAL LOW (ref 39.0–52.0)
Hemoglobin: 11.4 g/dL — ABNORMAL LOW (ref 13.0–17.0)
Immature Granulocytes: 0 %
Lymphocytes Relative: 15 %
Lymphs Abs: 0.4 10*3/uL — ABNORMAL LOW (ref 0.7–4.0)
MCH: 26.8 pg (ref 26.0–34.0)
MCHC: 31.2 g/dL (ref 30.0–36.0)
MCV: 85.9 fL (ref 80.0–100.0)
Monocytes Absolute: 0.1 10*3/uL (ref 0.1–1.0)
Monocytes Relative: 3 %
Neutro Abs: 2.3 10*3/uL (ref 1.7–7.7)
Neutrophils Relative %: 82 %
Platelets: 357 10*3/uL (ref 150–400)
RBC: 4.25 MIL/uL (ref 4.22–5.81)
RDW: 14.6 % (ref 11.5–15.5)
WBC: 2.9 10*3/uL — ABNORMAL LOW (ref 4.0–10.5)
nRBC: 0 % (ref 0.0–0.2)

## 2019-10-25 LAB — COMPREHENSIVE METABOLIC PANEL
ALT: 14 U/L (ref 0–44)
AST: 22 U/L (ref 15–41)
Albumin: 3.1 g/dL — ABNORMAL LOW (ref 3.5–5.0)
Alkaline Phosphatase: 42 U/L (ref 38–126)
Anion gap: 11 (ref 5–15)
BUN: 12 mg/dL (ref 8–23)
CO2: 21 mmol/L — ABNORMAL LOW (ref 22–32)
Calcium: 8.4 mg/dL — ABNORMAL LOW (ref 8.9–10.3)
Chloride: 102 mmol/L (ref 98–111)
Creatinine, Ser: 0.73 mg/dL (ref 0.61–1.24)
GFR calc Af Amer: >60 mL/min (ref >60)
GFR calc non Af Amer: >60 mL/min (ref >60)
Glucose, Bld: 149 mg/dL — ABNORMAL HIGH (ref 70–99)
Potassium: 4.1 mmol/L (ref 3.5–5.1)
Sodium: 134 mmol/L — ABNORMAL LOW (ref 135–145)
Total Bilirubin: 0.5 mg/dL (ref 0.3–1.2)
Total Protein: 6.9 g/dL (ref 6.5–8.1)

## 2019-10-25 LAB — C-REACTIVE PROTEIN: CRP: 1 mg/dL — ABNORMAL HIGH (ref <1.0)

## 2019-10-25 LAB — ABO/RH
ABO/RH(D): A NEG
ABO/RH(D): A NEG

## 2019-10-25 LAB — MAGNESIUM: Magnesium: 1.9 mg/dL (ref 1.7–2.4)

## 2019-10-25 LAB — TYPE AND SCREEN
ABO/RH(D): A NEG
Antibody Screen: NEGATIVE

## 2019-10-25 LAB — BRAIN NATRIURETIC PEPTIDE: B Natriuretic Peptide: 334.1 pg/mL — ABNORMAL HIGH (ref 0.0–100.0)

## 2019-10-25 LAB — D-DIMER, QUANTITATIVE: D-Dimer, Quant: 1.41 ug/mL-FEU — ABNORMAL HIGH (ref 0.00–0.50)

## 2019-10-25 MED ORDER — MELATONIN 3 MG PO TABS
3.0000 mg | ORAL_TABLET | Freq: Every day | ORAL | Status: DC
  Administered 2019-10-25 – 2019-10-27 (×3): 3 mg via ORAL
  Filled 2019-10-25 (×5): qty 1

## 2019-10-25 NOTE — Progress Notes (Addendum)
Initial Nutrition Assessment  DOCUMENTATION CODES:   Obesity unspecified  INTERVENTION:  Recommend:  Liberalize diet to Regular  Ensure Enlive daily with breakfast (350 kcal, 20 gr protein)  Magic cup with Lunch and Dinner (290 kcal, 9 gr protein)   NUTRITION DIAGNOSIS:  Increased nutrient needs related to acute illness(COVID-19 (moderate disease). Symptoms since before Thanksgiving) as evidenced by per patient/family report, estimated needs.  GOAL:  Patient will meet greater than or equal to 90% of their needs  MONITOR:   Supplement acceptance, Weight trends, Labs, PO intake, I & O's  REASON FOR ASSESSMENT:   Malnutrition Screening Tool    ASSESSMENT: Patient is an obese 84 yo male with hx of DM 2,  HTN, CAD s/p CABG. Presents with shortness of breath- +COVID-19 (moderate disease per MD). Patient symptoms started before Thanksgiving per NP and tested positive on 12/1. O2 @ 2 liters per RN.   RD working remotely  Meal intake: none documented. Had episode of nausea and vomiting 12/8.   Review of weight history suggest loss since July of 8 kg (8%).   Medications reviewed and include: Decadron, Insulin, Vitamin C-500 mg daily, Zinc 220 mg daily.   Intake/Output Summary (Last 24 hours) at 10/25/2019 1059 Last data filed at 10/25/2019 0101 Gross per 24 hour  Intake 250 ml  Output -  Net 250 ml    Labs: BMP Latest Ref Rng & Units 10/25/2019 10/24/2019 01/28/2019  Glucose 70 - 99 mg/dL 149(H) 138(H) 177(H)  BUN 8 - 23 mg/dL 12 12 16   Creatinine 0.61 - 1.24 mg/dL 0.73 0.92 1.04  BUN/Creat Ratio 10 - 24 - - -  Sodium 135 - 145 mmol/L 134(L) 135 139  Potassium 3.5 - 5.1 mmol/L 4.1 3.9 3.9  Chloride 98 - 111 mmol/L 102 103 105  CO2 22 - 32 mmol/L 21(L) 22 24  Calcium 8.9 - 10.3 mg/dL 8.4(L) 8.6(L) 9.0     Diet Order:   Diet Order            Diet heart healthy/carb modified Room service appropriate? Yes; Fluid consistency: Thin  Diet effective now               EDUCATION NEEDS:   No education needs have been identified at this time Skin:  Skin Assessment: Reviewed RN Assessment  Last BM:  12/8  Height:   Ht Readings from Last 1 Encounters:  10/25/19 5\' 8"  (1.727 m)    Weight:   Wt Readings from Last 1 Encounters:  10/25/19 97.6 kg    Ideal Body Weight:  70 kg  BMI:  Body mass index is 32.7 kg/m.  Estimated Nutritional Needs:   Kcal:  2151-2317  Protein:  98-112 gr  Fluid:  2.1-2.3 liters daily  Colman Cater MS,RD,CSG,LDN Office: [phone] Pager: [phone]

## 2019-10-25 NOTE — Evaluation (Signed)
Physical Therapy Evaluation Patient Details Name: [name] MRN: [id number] DOB: 10-May-1935 Today's Date: 10/25/2019   History of Present Illness  84 y/o male w/ hx of hearing loss, SOB, SBO, nephrolithiasis, HTN, HLD, DM II, CAD, colonic polyps, cataract, acrcinoma of skin, a-fib, arthritis, tympanix membrane repair, L THA, lumbar laminectomy/decompression microdiscectomy, CABG, cardioversion. Pt recently dx with COVID and was admitted with acute hypoxic resp failure due to acute COVID 19 pneumonitis.  Clinical Impression   Pt admitted with above diagnosis. PTA was living home with spouse states was quite independent and did not use AD but this am states would feel safer with walker. Pt currently with functional limitations due to the deficits listed below (see PT Problem List). Pt presented with decreased activity tolerance, independence and overall strength. He was on 1L/min via [place] throughout session and was able to maintain sats >87% throughout even with ambulation. Reinforced use of flutter valve and initiated seated there ex. Pt will benefit from skilled PT to increase their independence and safety with mobility to allow discharge to the venue listed below.       Follow Up Recommendations Home health PT    Equipment Recommendations  Rolling walker with 5" wheels    Recommendations for Other Services OT consult     Precautions / Restrictions Precautions Precautions: Fall Restrictions Weight Bearing Restrictions: No      Mobility  Bed Mobility               General bed mobility comments: pt sitting in recliner at therapist arrival in room  Transfers Overall transfer level: Needs assistance Equipment used: Rolling walker (2 wheeled) Transfers: Sit to/from Stand;Stand Pivot Transfers Sit to Stand: Min guard Stand pivot transfers: Min guard          Ambulation/Gait     Assistive device: Rolling walker (2 wheeled) Gait Pattern/deviations: Step-through  pattern Gait velocity: significantly decrease   General Gait Details: 128ft with RW and min guard assist, sats in high 80s to 90s (minnoted 87%) pt on 1L/min via [place]  Stairs            Wheelchair Mobility    Modified Rankin (Stroke Patients Only)       Balance Overall balance assessment: Needs assistance Sitting-balance support: Feet supported Sitting balance-Leahy Scale: Good     Standing balance support: During functional activity;Bilateral upper extremity supported Standing balance-Leahy Scale: Fair                               Pertinent Vitals/Pain      Home Living Family/patient expects to be discharged to:: Private residence Living Arrangements: Spouse/significant other;Children Available Help at Discharge: Family   Home Access: Stairs to enter Entrance Stairs-Rails: Can reach both Entrance Stairs-Number of Steps: 6 front 1 back Home Layout: One level Home Equipment: Grab bars - tub/shower;Walker - 2 wheels;Wheelchair - manual;Crutches;Tub bench      Prior Function Level of Independence: Independent               Hand Dominance   Dominant Hand: Right    Extremity/Trunk Assessment   Upper Extremity Assessment Upper Extremity Assessment: Defer to OT evaluation    Lower Extremity Assessment Lower Extremity Assessment: Generalized weakness       Communication      Cognition Arousal/Alertness: Awake/alert Behavior During Therapy: WFL for tasks assessed/performed Overall Cognitive Status: Within Functional Limits for tasks assessed  General Comments: was apparently upset at staff earlier on prior to therapist arrival      General Comments      Exercises     Assessment/Plan    PT Assessment Patient needs continued PT services  PT Problem List Decreased strength;Decreased balance;Decreased mobility;Decreased activity tolerance;Decreased coordination       PT Treatment  Interventions Gait training;DME instruction;Stair training;Therapeutic exercise;Therapeutic activities;Functional mobility training;Balance training;Neuromuscular re-education;Patient/family education    PT Goals (Current goals can be found in the Care Plan section)  Acute Rehab PT Goals Patient Stated Goal: did not specifically mention goals PT Goal Formulation: With patient Time For Goal Achievement: 11/08/19 Potential to Achieve Goals: Good    Frequency Min 3X/week   Barriers to discharge        Co-evaluation               AM-PAC PT "6 Clicks" Mobility  Outcome Measure Help needed turning from your back to your side while in a flat bed without using bedrails?: A Little Help needed moving from lying on your back to sitting on the side of a flat bed without using bedrails?: A Little Help needed moving to and from a bed to a chair (including a wheelchair)?: A Little Help needed standing up from a chair using your arms (e.g., wheelchair or bedside chair)?: A Little Help needed to walk in hospital room?: A Little Help needed climbing 3-5 steps with a railing? : A Lot 6 Click Score: 17    End of Session Equipment Utilized During Treatment: Oxygen Activity Tolerance: Patient limited by lethargy;Patient limited by fatigue Patient left: in chair;with call bell/phone within reach Nurse Communication: Mobility status PT Visit Diagnosis: Other abnormalities of gait and mobility (R26.89);Muscle weakness (generalized) (M62.81)    Time: [id number] PT Time Calculation (min) (ACUTE ONLY): 28 min   Charges:     PT Treatments $Therapeutic Activity: 8-22 mins        Horald Chestnut, PT   Delford Field 10/25/2019, 4:47 PM

## 2019-10-25 NOTE — Progress Notes (Signed)
Ok to start melatonin 3mg  PO qhs per Dr Candiss Norse.  Onnie Boer, PharmD, BCIDP, AAHIVP, CPP Infectious Disease Pharmacist 10/25/2019 7:08 PM

## 2019-10-25 NOTE — Progress Notes (Signed)
PROGRESS NOTE                                                                                                                                                                                                             Patient Demographics:    [name], is a 84 y.o. male, DOB - 1935-03-10, EHU:[id number]  Outpatient Primary MD for the patient is [name], Evie Lacks, MD    LOS - 1  Admit date - 10/24/2019    Chief Complaint  Patient presents with  . COVID +  . Shortness of Breath  . Nausea  . Emesis       Brief Narrative  MONISH HALIBURTON is a 84 y.o. male with medical history significant for CAD s/p CABG, paroxysmal atrial fibrillation on Eliquis, type 2 diabetes, hypertension, and hyperlipidemia who presents to the ED for evaluation of progressive shortness of breath, was diagnosed with Covid.   Subjective:    Brett Albino today has, No headache, No chest pain, No abdominal pain - No Nausea, No new weakness tingling or numbness, improved - SOB.     Assessment  & Plan :     1. Acute Hypoxic Resp. Failure due to Acute Covid 19 Viral Pneumonitis during the ongoing 2020 Covid 19 Pandemic - he has moderate disease, on 1.5 lit o2, steroids and Remdesivir,  Monitor.  Encouraged the patient to sit up in chair in the daytime use I-S and flutter valve for pulmonary toiletry and then prone in bed when at night.  Actemra off label use - patient was told that if COVID-19 pneumonitis gets worse we might potentially use Actemra off label, patient denies any known history of tuberculosis or hepatitis, understands the risks and benefits and wants to proceed with Actemra treatment if required.   SpO2: 93 % O2 Flow Rate (L/min): 1.5 L/min  Recent Labs  Lab 10/24/19 2027 10/25/19 0818  CRP 1.2*  --   DDIMER 1.78* 1.41*  FERRITIN 24  --   PROCALCITON <0.10  --     Hepatic Function Latest Ref Rng & Units  10/25/2019 10/24/2019 01/28/2019  Total Protein 6.5 - 8.1 g/dL 6.9 7.5 7.4  Albumin 3.5 - 5.0 g/dL 3.1(L) 3.4(L) 4.0  AST 15 - 41 U/L '22 23 29  ' ALT 0 - 44 U/L '14 14 30  ' Alk Phosphatase  38 - 126 U/L 42 53 41  Total Bilirubin 0.3 - 1.2 mg/dL 0.5 0.8 0.7  Bilirubin, Direct 0.0 - 0.3 mg/dL - - -     2.  Paroxysmal atrial fibrillation: CHA2DS2-VASc score is 5.  He is in sinus rhythm at time of admission. Continue Eliquis, Amio and B.Blocker.  3. CAD s/p CABG: Chronic and stable, denies any chest pain. On ASA, Statin and Toprol was secondary prevention.   4. Hypertension: Continue home Toprol-XL.  5. Hyperlipidemia: -Continue simvastatin.  6. Mild Viral Leukopenia - monitor.  7. Type 2 diabetes: On Metformin and repaglinide as an outpatient. Use SSI here.  Lab Results  Component Value Date   HGBA1C 6.5 11/23/2018    CBG (last 3)  Recent Labs    10/24/19 2340 10/25/19 0753  GLUCAP 100* 146*     Condition - Fair  Family Communication  :  Called daughter on  [phone] at 9.53 am on 12/9 - no response  Code Status :  Full  Diet :   Diet Order            Diet heart healthy/carb modified Room service appropriate? Yes; Fluid consistency: Thin  Diet effective now               Disposition Plan  :  Med  Consults  : None  Procedures  :     PUD Prophylaxis :     DVT Prophylaxis  :  Eliquis  Lab Results  Component Value Date   PLT 357 10/25/2019    Inpatient Medications  Scheduled Meds: . amiodarone  200 mg Oral Daily  . apixaban  5 mg Oral BID  . aspirin  81 mg Oral Daily  . dexamethasone (DECADRON) injection  6 mg Intravenous Q24H  . gabapentin  100 mg Oral TID  . insulin aspart  0-5 Units Subcutaneous QHS  . insulin aspart  0-9 Units Subcutaneous TID WC  . ipratropium  2 puff Inhalation Q6H  . metoprolol succinate  25 mg Oral Daily  . simvastatin  10 mg Oral Daily  . vitamin C  500 mg Oral Daily  . zinc sulfate  220 mg Oral Daily   Continuous  Infusions: . remdesivir 100 mg in NS 100 mL 100 mg (10/25/19 0936)   PRN Meds:.acetaminophen, chlorpheniramine-HYDROcodone, guaiFENesin-dextromethorphan, ondansetron **OR** ondansetron (ZOFRAN) IV  Antibiotics  :    Anti-infectives (From admission, onward)   Start     Dose/Rate Route Frequency Ordered Stop   10/25/19 1000  remdesivir 100 mg in sodium chloride 0.9 % 100 mL IVPB     100 mg 200 mL/hr over 30 Minutes Intravenous Daily 10/24/19 2252 10/29/19 0959   10/24/19 2315  remdesivir 200 mg in sodium chloride 0.9% 250 mL IVPB     200 mg 580 mL/hr over 30 Minutes Intravenous Once 10/24/19 2252 10/25/19 0101       Time Spent in minutes  30   Lala Lund M.D on 10/25/2019 at 9:55 AM  To page go to [email or web address] - password [facility]  [facility] -  Office  [phone]    See all Orders from today for further details    Objective:   Vitals:   10/25/19 0508 10/25/19 0754 10/25/19 0800 10/25/19 0937  BP: (!) 154/72 140/72 133/67   Pulse: 70  64 73  Resp: (!) 22  20   Temp: 98.7 F (37.1 C) 97.8 F (36.6 C)    TempSrc: Oral Oral    SpO2: 94%  91% 93%  Weight: 97.6 kg     Height: '5\' 8"'  (1.727 m)       Wt Readings from Last 3 Encounters:  10/25/19 97.6 kg  06/16/19 106.3 kg  05/03/19 99.8 kg     Intake/Output Summary (Last 24 hours) at 10/25/2019 0955 Last data filed at 10/25/2019 0101 Gross per 24 hour  Intake 250 ml  Output -  Net 250 ml     Physical Exam  Awake Alert,  No new F.N deficits, Normal affect [place].AT,PERRAL Supple Neck,No JVD, No cervical lymphadenopathy appriciated.  Symmetrical Chest wall movement, Good air movement bilaterally, CTAB RRR,No Gallops,Rubs or new Murmurs, No Parasternal Heave +ve B.Sounds, Abd Soft, No tenderness, No organomegaly appriciated, No rebound - guarding or rigidity. No Cyanosis, Clubbing or edema, No new Rash or bruise       Data Review:    CBC Recent Labs  Lab 10/24/19 2027 10/25/19 0818  WBC 5.6 2.9*   HGB 13.0 11.4*  HCT 40.9 36.5*  PLT 341 357  MCV 87.0 85.9  MCH 27.7 26.8  MCHC 31.8 31.2  RDW 14.5 14.6  LYMPHSABS 1.0 0.4*  MONOABS 0.5 0.1  EOSABS 0.1 0.0  BASOSABS 0.0 0.0    Chemistries  Recent Labs  Lab 10/24/19 2027 10/25/19 0818  NA 135 134*  K 3.9 4.1  CL 103 102  CO2 22 21*  GLUCOSE 138* 149*  BUN 12 12  CREATININE 0.92 0.73  CALCIUM 8.6* 8.4*  MG  --  1.9  AST 23 22  ALT 14 14  ALKPHOS 53 42  BILITOT 0.8 0.5   ------------------------------------------------------------------------------------------------------------------ Recent Labs    10/24/19 2027  TRIG 130    Lab Results  Component Value Date   HGBA1C 6.5 11/23/2018   ------------------------------------------------------------------------------------------------------------------ No results for input(s): TSH, T4TOTAL, T3FREE, THYROIDAB in the last 72 hours.  Invalid input(s): FREET3  Cardiac Enzymes No results for input(s): CKMB, TROPONINI, MYOGLOBIN in the last 168 hours.  Invalid input(s): CK ------------------------------------------------------------------------------------------------------------------    Component Value Date/Time   BNP 169.9 (H) 05/10/2018 1321    Micro Results Recent Results (from the past 240 hour(s))  Novel Coronavirus, NAA ([facility])     Status: Abnormal   Collection Time: 10/18/19 12:00 AM   Specimen: Nasopharyngeal(NP) swabs in vial transport medium   NASOPHARYNGE  TESTING  Result Value Ref Range Status   SARS-CoV-2, NAA Detected (A) Not Detected Final    Comment: This nucleic acid amplification test was developed and its performance characteristics determined by [employer]. Nucleic acid amplification tests include PCR and TMA. This test has not been FDA cleared or approved. This test has been authorized by FDA under an Emergency Use Authorization (EUA). This test is only authorized for the duration of time the declaration that  circumstances exist justifying the authorization of the emergency use of in vitro diagnostic tests for detection of SARS-CoV-2 virus and/or diagnosis of COVID-19 infection under section 564(b)(1) of the Act, 21 U.S.C. 314HFW-2(O) (1), unless the authorization is terminated or revoked sooner. When diagnostic testing is negative, the possibility of a false negative result should be considered in the context of a patient's recent exposures and the presence of clinical signs and symptoms consistent with COVID-19. An individual without symptoms of COVID-19 and who is not shedding SARS-CoV-2 virus would  expect to have a negative (not detected) result in this assay.     Radiology Reports Dg Chest Port 1 View  Result Date: 10/24/2019 CLINICAL DATA:  Dyspnea, COVID-19 positive  EXAM: PORTABLE CHEST 1 VIEW COMPARISON:  CTA chest and radiograph 05/10/2018 FINDINGS: Postsurgical changes related to prior CABG including intact and aligned sternotomy wires and multiple surgical clips projecting over the mediastinum. Cardiomegaly is similar to comparison. Widespread patchy peripheral and basilar predominant airspace and interstitial opacities. No pneumothorax. Small right pleural effusion. No visible left effusion the portion of the costophrenic sulcus is collimated. No acute osseous or soft tissue abnormality. Degenerative changes are present in the imaged spine and shoulders. IMPRESSION: 1. Widespread patchy peripheral and basilar predominant airspace opacities compatible with multifocal pneumonia including possible COVID-19 etiology. 2. Small right pleural effusion. 3. Stable cardiomegaly.  Post CABG changes. Electronically Signed   By: Lovena Le M.D.   On: 10/24/2019 20:53

## 2019-10-25 NOTE — Plan of Care (Signed)
New admit

## 2019-10-25 NOTE — ED Notes (Signed)
[facility] at bedside 

## 2019-10-26 LAB — GLUCOSE, CAPILLARY
Glucose-Capillary: 164 mg/dL — ABNORMAL HIGH (ref 70–99)
Glucose-Capillary: 243 mg/dL — ABNORMAL HIGH (ref 70–99)
Glucose-Capillary: 280 mg/dL — ABNORMAL HIGH (ref 70–99)
Glucose-Capillary: 217 mg/dL — ABNORMAL HIGH (ref 70–99)

## 2019-10-26 LAB — CBC WITH DIFFERENTIAL/PLATELET
Abs Immature Granulocytes: 0.02 10*3/uL (ref 0.00–0.07)
Basophils Absolute: 0 10*3/uL (ref 0.0–0.1)
Basophils Relative: 0 %
Eosinophils Absolute: 0 10*3/uL (ref 0.0–0.5)
Eosinophils Relative: 0 %
HCT: 34.9 % — ABNORMAL LOW (ref 39.0–52.0)
Hemoglobin: 11.2 g/dL — ABNORMAL LOW (ref 13.0–17.0)
Immature Granulocytes: 0 %
Lymphocytes Relative: 13 %
Lymphs Abs: 1 10*3/uL (ref 0.7–4.0)
MCH: 27.5 pg (ref 26.0–34.0)
MCHC: 32.1 g/dL (ref 30.0–36.0)
MCV: 85.7 fL (ref 80.0–100.0)
Monocytes Absolute: 0.8 10*3/uL (ref 0.1–1.0)
Monocytes Relative: 10 %
Neutro Abs: 5.6 10*3/uL (ref 1.7–7.7)
Neutrophils Relative %: 77 %
Platelets: 391 10*3/uL (ref 150–400)
RBC: 4.07 MIL/uL — ABNORMAL LOW (ref 4.22–5.81)
RDW: 14.6 % (ref 11.5–15.5)
WBC: 7.4 10*3/uL (ref 4.0–10.5)
nRBC: 0 % (ref 0.0–0.2)

## 2019-10-26 LAB — COMPREHENSIVE METABOLIC PANEL
ALT: 14 U/L (ref 0–44)
AST: 19 U/L (ref 15–41)
Albumin: 3 g/dL — ABNORMAL LOW (ref 3.5–5.0)
Alkaline Phosphatase: 42 U/L (ref 38–126)
Anion gap: 10 (ref 5–15)
BUN: 20 mg/dL (ref 8–23)
CO2: 23 mmol/L (ref 22–32)
Calcium: 8.8 mg/dL — ABNORMAL LOW (ref 8.9–10.3)
Chloride: 102 mmol/L (ref 98–111)
Creatinine, Ser: 0.94 mg/dL (ref 0.61–1.24)
GFR calc Af Amer: >60 mL/min (ref >60)
GFR calc non Af Amer: >60 mL/min (ref >60)
Glucose, Bld: 142 mg/dL — ABNORMAL HIGH (ref 70–99)
Potassium: 3.9 mmol/L (ref 3.5–5.1)
Sodium: 135 mmol/L (ref 135–145)
Total Bilirubin: 0.7 mg/dL (ref 0.3–1.2)
Total Protein: 6.5 g/dL (ref 6.5–8.1)

## 2019-10-26 LAB — BRAIN NATRIURETIC PEPTIDE: B Natriuretic Peptide: 192.8 pg/mL — ABNORMAL HIGH (ref 0.0–100.0)

## 2019-10-26 LAB — D-DIMER, QUANTITATIVE: D-Dimer, Quant: 0.85 ug/mL-FEU — ABNORMAL HIGH (ref 0.00–0.50)

## 2019-10-26 LAB — MAGNESIUM: Magnesium: 2.2 mg/dL (ref 1.7–2.4)

## 2019-10-26 LAB — C-REACTIVE PROTEIN: CRP: 0.6 mg/dL (ref <1.0)

## 2019-10-26 NOTE — Progress Notes (Signed)
Occupational Therapy Evaluation Patient Details Name: [name] MRN: [id number] DOB: [birth year] Today's Date: 10/26/2019    History of Present Illness 84 y/o male w/ hx of hearing loss, SOB, SBO, nephrolithiasis, HTN, HLD, DM II, CAD, colonic polyps, cataract, acrcinoma of skin, a-fib, arthritis, tympanix membrane repair, L THA, lumbar laminectomy/decompression microdiscectomy, CABG, cardioversion. Pt recently dx with COVID and was admitted with acute hypoxic resp failure due to acute COVID 19 pneumonitis.   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 Pt's daughter is currently able to assist at DC. PT overall set upfor ADL tasks. Able to ambulate @ 150 ft on RA with SpO2 remaining above 90; 1/4 DOE. Educated pt on energy conservation and level 1 HEP. Educated on reducing risk of falls at home. Encourage pt to ambulate with nursing staff. No further OT needs.     Follow Up Recommendations  No OT follow up;Supervision - Intermittent    Equipment Recommendations  None recommended by OT    Recommendations for Other Services       Precautions / Restrictions Precautions Precautions: Fall      Mobility Bed Mobility               General bed mobility comments: OOB in chair  Transfers Overall transfer level: Needs assistance   Transfers: Sit to/from Stand;Stand Pivot Transfers Sit to Stand: Supervision Stand pivot transfers: Supervision            Balance Overall balance assessment: Mild deficits observed, not formally tested                                         ADL either performed or assessed with clinical judgement   ADL Overall ADL's : Needs assistance/impaired                                     Functional mobility during ADLs: Supervision/safety;Rolling walker General ADL Comments: Overall set up for ADL tasks. Educated pt on energy conservation strategies and use of DME to reduce risk  of falls.  PT verbalized understanding. PT ambulated with RW, which he feels safer using at this time.      Vision         Perception     Praxis      Pertinent Vitals/Pain Pain Assessment: No/denies pain     Hand Dominance Right   Extremity/Trunk Assessment Upper Extremity Assessment Upper Extremity Assessment: Generalized weakness   Lower Extremity Assessment Lower Extremity Assessment: Defer to PT evaluation       Communication Communication Communication: No difficulties   Cognition Arousal/Alertness: Awake/alert Behavior During Therapy: WFL for tasks assessed/performed Overall Cognitive Status: Within Functional Limits for tasks assessed                                     General Comments       Exercises Exercises: Other exercises Other Exercises Other Exercises: flutter valve x 10 Other Exercises: level 1 theraband HEP reviewd Other Exercises: Issued standing HEP. Verbally reviewed   Shoulder Instructions      Home Living Family/patient expects to be discharged to:: Private residence Living Arrangements: Spouse/significant other;Children Available Help at Discharge: Family Type of Home:  House Home Access: Stairs to enter [employer] of Steps: 6 front 1 back Entrance Stairs-Rails: Can reach both Home Layout: One level     Bathroom Shower/Tub: Teacher, early years/pre: Standard Bathroom Accessibility: Yes How Accessible: Accessible via walker Home Equipment: Grab bars - tub/shower;Walker - 2 wheels;Wheelchair - manual;Crutches;Tub bench          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        OT Problem List: Decreased activity tolerance      OT Treatment/Interventions:      OT Goals(Current goals can be found in the care plan section) Acute Rehab OT Goals Patient Stated Goal: to get better and go home OT Goal Formulation: All assessment  and education complete, DC therapy  OT Frequency:     Barriers to D/C:            Co-evaluation              AM-PAC OT "6 Clicks" Daily Activity     Outcome Measure Help from another person eating meals?: None Help from another person taking care of personal grooming?: None Help from another person toileting, which includes using toliet, bedpan, or urinal?: None Help from another person bathing (including washing, rinsing, drying)?: A Little Help from another person to put on and taking off regular upper body clothing?: A Little Help from another person to put on and taking off regular lower body clothing?: A Little 6 Click Score: 21   End of Session Equipment Utilized During Treatment: Rolling walker Nurse Communication: Mobility status  Activity Tolerance: Patient tolerated treatment well Patient left: in chair;with call bell/phone within reach  OT Visit Diagnosis: Muscle weakness (generalized) (M62.81)                Time: 1430-1500 OT Time Calculation (min): 30 min Charges:  OT General Charges $OT Visit: 1 Visit OT Evaluation $OT Eval Moderate Complexity: 1 Mod OT Treatments $Self Care/Home Management : 8-22 mins  Maurie Boettcher, OT/L   Acute OT Clinical Specialist Acute Rehabilitation Services Pager [phone] Office [phone]   [facility] 10/26/2019, 3:13 PM

## 2019-10-27 LAB — COMPREHENSIVE METABOLIC PANEL
ALT: 17 U/L (ref 0–44)
AST: 22 U/L (ref 15–41)
Albumin: 2.9 g/dL — ABNORMAL LOW (ref 3.5–5.0)
Alkaline Phosphatase: 42 U/L (ref 38–126)
Anion gap: 11 (ref 5–15)
BUN: 24 mg/dL — ABNORMAL HIGH (ref 8–23)
CO2: 23 mmol/L (ref 22–32)
Calcium: 8.5 mg/dL — ABNORMAL LOW (ref 8.9–10.3)
Chloride: 101 mmol/L (ref 98–111)
Creatinine, Ser: 0.79 mg/dL (ref 0.61–1.24)
GFR calc Af Amer: >60 mL/min (ref >60)
GFR calc non Af Amer: >60 mL/min (ref >60)
Glucose, Bld: 170 mg/dL — ABNORMAL HIGH (ref 70–99)
Potassium: 3.9 mmol/L (ref 3.5–5.1)
Sodium: 135 mmol/L (ref 135–145)
Total Bilirubin: 0.7 mg/dL (ref 0.3–1.2)
Total Protein: 6.2 g/dL — ABNORMAL LOW (ref 6.5–8.1)

## 2019-10-27 LAB — CBC WITH DIFFERENTIAL/PLATELET
Abs Immature Granulocytes: 0.02 10*3/uL (ref 0.00–0.07)
Basophils Absolute: 0 10*3/uL (ref 0.0–0.1)
Basophils Relative: 0 %
Eosinophils Absolute: 0 10*3/uL (ref 0.0–0.5)
Eosinophils Relative: 0 %
HCT: 36.2 % — ABNORMAL LOW (ref 39.0–52.0)
Hemoglobin: 11.5 g/dL — ABNORMAL LOW (ref 13.0–17.0)
Immature Granulocytes: 0 %
Lymphocytes Relative: 13 %
Lymphs Abs: 1.1 10*3/uL (ref 0.7–4.0)
MCH: 27.2 pg (ref 26.0–34.0)
MCHC: 31.8 g/dL (ref 30.0–36.0)
MCV: 85.6 fL (ref 80.0–100.0)
Monocytes Absolute: 0.8 10*3/uL (ref 0.1–1.0)
Monocytes Relative: 10 %
Neutro Abs: 6.5 10*3/uL (ref 1.7–7.7)
Neutrophils Relative %: 77 %
Platelets: 421 10*3/uL — ABNORMAL HIGH (ref 150–400)
RBC: 4.23 MIL/uL (ref 4.22–5.81)
RDW: 14.7 % (ref 11.5–15.5)
WBC: 8.4 10*3/uL (ref 4.0–10.5)
nRBC: 0 % (ref 0.0–0.2)

## 2019-10-27 LAB — GLUCOSE, CAPILLARY
Glucose-Capillary: 158 mg/dL — ABNORMAL HIGH (ref 70–99)
Glucose-Capillary: 223 mg/dL — ABNORMAL HIGH (ref 70–99)
Glucose-Capillary: 243 mg/dL — ABNORMAL HIGH (ref 70–99)
Glucose-Capillary: 224 mg/dL — ABNORMAL HIGH (ref 70–99)

## 2019-10-27 LAB — D-DIMER, QUANTITATIVE: D-Dimer, Quant: 0.75 ug/mL-FEU — ABNORMAL HIGH (ref 0.00–0.50)

## 2019-10-27 LAB — MAGNESIUM: Magnesium: 2.2 mg/dL (ref 1.7–2.4)

## 2019-10-27 LAB — BRAIN NATRIURETIC PEPTIDE: B Natriuretic Peptide: 150.2 pg/mL — ABNORMAL HIGH (ref 0.0–100.0)

## 2019-10-27 LAB — C-REACTIVE PROTEIN: CRP: 0.5 mg/dL (ref <1.0)

## 2019-10-27 NOTE — Progress Notes (Signed)
PROGRESS NOTE                                                                                                                                                                                                             Patient Demographics:    [name], is a 84 y.o. male, DOB - Feb 02, 1935, HYI:[id number]  Outpatient Primary MD for the patient is [name], Evie Lacks, MD    LOS - 3  Admit date - 10/24/2019    Chief Complaint  Patient presents with  . COVID  . Shortness of Breath  . Nausea  . Emesis       Brief Narrative  [name] is a 84 y.o. male with medical history significant for CAD s/p CABG, paroxysmal atrial fibrillation on Eliquis, type 2 diabetes, hypertension, and hyperlipidemia who presents to the ED for evaluation of progressive shortness of breath, was diagnosed with Covid.   Subjective:   Patient in bed, appears comfortable, denies any headache, no fever, no chest pain or pressure, no shortness of breath , no abdominal pain. No focal weakness.   Assessment  & Plan :     1. Acute Hypoxic Resp. Failure due to Acute Covid 19 Viral Pneumonitis during the ongoing 2020 Covid 19 Pandemic - he has moderate disease, he has been treated with IV steroids and Remdesivir, now down to room air and symptom-free, finish his course and discharge in the morning.  Encouraged the patient to sit up in chair in the daytime use I-S and flutter valve for pulmonary toiletry and then prone in bed when at night.   Recent Labs  Lab 10/24/19 2027 10/25/19 0818 10/26/19 0300 10/27/19 0515  CRP 1.2* 1.0* 0.6 0.5  DDIMER 1.78* 1.41* 0.85* 0.75*  FERRITIN 24  --   --   --   BNP  --  334.1* 192.8* 150.2*  PROCALCITON <0.10  --   --   --     Hepatic Function Latest Ref Rng & Units 10/27/2019 10/26/2019 10/25/2019  Total Protein 6.5 - 8.1 g/dL 6.2(L) 6.5 6.9  Albumin 3.5 - 5.0 g/dL 2.9(L) 3.0(L) 3.1(L)  AST 15  - 41 U/L _0 ALT 0 - 44 U/L _1 Alk Phosphatase 38 - 126 U/L 42 42 42  Total Bilirubin 0.3 - 1.2 mg/dL 0.7 0.7  0.5  Bilirubin, Direct 0.0 - 0.3 mg/dL - - -     2.  Paroxysmal atrial fibrillation: CHA2DS2-VASc score is 5.  He is in sinus rhythm at time of admission. Continue Eliquis, Amio and B.Blocker.  3. CAD s/p CABG: Chronic and stable, denies any chest pain. On ASA, Statin and Toprol was secondary prevention.   4. Hypertension: Continue home Toprol-XL.  5. Hyperlipidemia: -Continue simvastatin.  6. Mild Viral Leukopenia - monitor.  7. Type 2 diabetes: On Metformin and repaglinide as an outpatient. Use SSI here.  Lab Results  Component Value Date   HGBA1C 6.5 (H) 10/25/2019    CBG (last 3)  Recent Labs    10/26/19 1634 10/26/19 2107 10/27/19 0733  GLUCAP 280* 217* 158*    Condition - Fair  Family Communication  :    Called daughter on  [phone] at 9.53 am on 12/9 - no response  Called wife on 10/27/2019 at 9:14 AM.  Code Status :  Full  Diet :   Diet Order            Diet heart healthy/carb modified Room service appropriate? Yes; Fluid consistency: Thin  Diet effective now               Disposition Plan  :  Med  Consults  : None  Procedures  :     PUD Prophylaxis :     DVT Prophylaxis  :  Eliquis  Lab Results  Component Value Date   PLT 421 (H) 10/27/2019    Inpatient Medications  Scheduled Meds: . amiodarone  200 mg Oral Daily  . apixaban  5 mg Oral BID  . aspirin  81 mg Oral Daily  . dexamethasone (DECADRON) injection  6 mg Intravenous Q24H  . gabapentin  100 mg Oral TID  . insulin aspart  0-5 Units Subcutaneous QHS  . insulin aspart  0-9 Units Subcutaneous TID WC  . ipratropium  2 puff Inhalation Q6H  . Melatonin  3 mg Oral QHS  . metoprolol succinate  25 mg Oral Daily  . simvastatin  10 mg Oral Daily  . vitamin C  500 mg Oral Daily  . zinc sulfate  220 mg Oral Daily   Continuous Infusions: . remdesivir 100  mg in NS 100 mL 100 mg (10/26/19 0853)   PRN Meds:.acetaminophen, chlorpheniramine-HYDROcodone, guaiFENesin-dextromethorphan, ondansetron **OR** ondansetron (ZOFRAN) IV  Antibiotics  :    Anti-infectives (From admission, onward)   Start     Dose/Rate Route Frequency Ordered Stop   10/25/19 1000  remdesivir 100 mg in sodium chloride 0.9 % 100 mL IVPB     100 mg 200 mL/hr over 30 Minutes Intravenous Daily 10/24/19 2252 10/29/19 0959   10/24/19 2315  remdesivir 200 mg in sodium chloride 0.9% 250 mL IVPB     200 mg 580 mL/hr over 30 Minutes Intravenous Once 10/24/19 2252 10/25/19 0101       Time Spent in minutes  30   Lala Lund M.D on 10/27/2019 at 9:12 AM  To page go to [email or web address] - password [facility]  [facility] -  Office  [phone]    See all Orders from today for further details    Objective:   Vitals:   10/26/19 1810 10/26/19 2110 10/27/19 0505 10/27/19 0732  BP: 140/60 132/63 130/65 136/64  Pulse: 65 62 60 (!) 56  Resp: _0 Temp: 97.8 F (36.6 C) 98.2 F (36.8 C) 98 F (36.7 C) 97.6 F (36.4  C)  TempSrc: Oral Oral Oral Oral  SpO2: 91% 90% 90%   Weight:      Height:        Wt Readings from Last 3 Encounters:  10/25/19 97.6 kg  06/16/19 106.3 kg  05/03/19 99.8 kg     Intake/Output Summary (Last 24 hours) at 10/27/2019 0912 Last data filed at 10/27/2019 0500 Gross per 24 hour  Intake 240 ml  Output 950 ml  Net -710 ml     Physical Exam  Awake Alert,   No new F.N deficits, Normal affect [place].AT,PERRAL Supple Neck,No JVD, No cervical lymphadenopathy appriciated.  Symmetrical Chest wall movement, Good air movement bilaterally, CTAB RRR,No Gallops, Rubs or new Murmurs, No Parasternal Heave +ve B.Sounds, Abd Soft, No tenderness, No organomegaly appriciated, No rebound - guarding or rigidity. No Cyanosis, Clubbing or edema, No new Rash or bruise   Data Review:    CBC Recent Labs  Lab 10/24/19 2027 10/25/19 0818  10/26/19 0300 10/27/19 0515  WBC 5.6 2.9* 7.4 8.4  HGB 13.0 11.4* 11.2* 11.5*  HCT 40.9 36.5* 34.9* 36.2*  PLT 341 357 391 421*  MCV 87.0 85.9 85.7 85.6  MCH 27.7 26.8 27.5 27.2  MCHC 31.8 31.2 32.1 31.8  RDW 14.5 14.6 14.6 14.7  LYMPHSABS 1.0 0.4* 1.0 1.1  MONOABS 0.5 0.1 0.8 0.8  EOSABS 0.1 0.0 0.0 0.0  BASOSABS 0.0 0.0 0.0 0.0    Chemistries  Recent Labs  Lab 10/24/19 2027 10/25/19 0818 10/26/19 0300 10/27/19 0515  NA 135 134* 135 135  K 3.9 4.1 3.9 3.9  CL 103 102 102 101  CO2 22 21* 23 23  GLUCOSE 138* 149* 142* 170*  BUN _0 24*  CREATININE 0.92 0.73 0.94 0.79  CALCIUM 8.6* 8.4* 8.8* 8.5*  MG  --  1.9 2.2 2.2  AST _1 ALT _2 ALKPHOS 53 42 42 42  BILITOT 0.8 0.5 0.7 0.7   ------------------------------------------------------------------------------------------------------------------ Recent Labs    10/24/19 2027  TRIG 130    Lab Results  Component Value Date   HGBA1C 6.5 (H) 10/25/2019   ------------------------------------------------------------------------------------------------------------------ No results for input(s): TSH, T4TOTAL, T3FREE, THYROIDAB in the last 72 hours.  Invalid input(s): FREET3  Cardiac Enzymes No results for input(s): CKMB, TROPONINI, MYOGLOBIN in the last 168 hours.  Invalid input(s): CK ------------------------------------------------------------------------------------------------------------------    Component Value Date/Time   BNP 150.2 (H) 10/27/2019 0515    Micro Results Recent Results (from the past 240 hour(s))  Novel Coronavirus, NAA ([facility])     Status: Abnormal   Collection Time: 10/18/19 12:00 AM   Specimen: Nasopharyngeal(NP) swabs in vial transport medium   NASOPHARYNGE  TESTING  Result Value Ref Range Status   SARS-CoV-2, NAA Detected (A) Not Detected Final    Comment: This nucleic acid amplification test was developed and its performance characteristics determined by  [employer]. Nucleic acid amplification tests include PCR and TMA. This test has not been FDA cleared or approved. This test has been authorized by FDA under an Emergency Use Authorization (EUA). This test is only authorized for the duration of time the declaration that circumstances exist justifying the authorization of the emergency use of in vitro diagnostic tests for detection of SARS-CoV-2 virus and/or diagnosis of COVID-19 infection under section 564(b)(1) of the Act, 21 U.S.C. 505WPV-9(Y) (1), unless the authorization is terminated or revoked sooner. When diagnostic testing is negative, the possibility of a false negative result should be considered in  the context of a patient's recent exposures and the presence of clinical signs and symptoms consistent with COVID-19. An individual without symptoms of COVID-19 and who is not shedding SARS-CoV-2 virus would  expect to have a negative (not detected) result in this assay.   Blood Culture (routine x 2)     Status: None (Preliminary result)   Collection Time: 10/24/19  8:27 PM   Specimen: BLOOD  Result Value Ref Range Status   Specimen Description   Final    BLOOD LEFT ANTECUBITAL Performed at [facility] [address] [place], [place] [address]    Special Requests   Final    BOTTLES DRAWN AEROBIC AND ANAEROBIC Blood Culture adequate volume Performed at [facility] [address], [place], [place] [address]    Culture   Final    NO GROWTH 2 DAYS Performed at [facility] [address] [place], [place] [address]    Report Status PENDING  Incomplete  Blood Culture (routine x 2)     Status: None (Preliminary result)   Collection Time: 10/24/19 10:36 PM   Specimen: BLOOD  Result Value Ref Range Status   Specimen Description   Final    BLOOD RIGHT ANTECUBITAL Performed at [facility] [address] [place], [place] [address]    Special Requests    Final    BOTTLES DRAWN AEROBIC AND ANAEROBIC Blood Culture adequate volume Performed at [facility] [address], [place], [place] [address]    Culture   Final    NO GROWTH 1 DAY Performed at [facility], [place] [address], [place], [place] [address]    Report Status PENDING  Incomplete    Radiology Reports DG Chest Port 1 View  Result Date: 10/24/2019 CLINICAL DATA:  Dyspnea, COVID-19 positive EXAM: PORTABLE CHEST 1 VIEW COMPARISON:  CTA chest and radiograph 05/10/2018 FINDINGS: Postsurgical changes related to prior CABG including intact and aligned sternotomy wires and multiple surgical clips projecting over the mediastinum. Cardiomegaly is similar to comparison. Widespread patchy peripheral and basilar predominant airspace and interstitial opacities. No pneumothorax. Small right pleural effusion. No visible left effusion the portion of the costophrenic sulcus is collimated. No acute osseous or soft tissue abnormality. Degenerative changes are present in the imaged spine and shoulders. IMPRESSION: 1. Widespread patchy peripheral and basilar predominant airspace opacities compatible with multifocal pneumonia including possible COVID-19 etiology. 2. Small right pleural effusion. 3. Stable cardiomegaly.  Post CABG changes. Electronically Signed   By: Lovena Le M.D.   On: 10/24/2019 20:53

## 2019-10-27 NOTE — Progress Notes (Signed)
Physical Therapy Treatment Patient Details Name: [name] MRN: [id number] DOB: 05-07-1935 Today's Date: 10/27/2019    History of Present Illness 84 y/o male w/ hx of hearing loss, SOB, SBO, nephrolithiasis, HTN, HLD, DM II, CAD, colonic polyps, cataract, acrcinoma of skin, a-fib, arthritis, tympanix membrane repair, L THA, lumbar laminectomy/decompression microdiscectomy, CABG, cardioversion. Pt recently dx with COVID and was admitted with acute hypoxic resp failure due to acute COVID 19 pneumonitis.    PT Comments    Pt making progress with mobility. He was able to tolerate more activity today and also with less assistance. Pt now able to get in/out bed with mod I, ambulates in hall up to 344ft with SBA-min guard assist and RW. He has been issued RW for home use. Pt states that spouse at home has not been doing very well and also has COVID. Pt is now on room air and sats in 90s after ambulation.     Follow Up Recommendations  Home health PT     Equipment Recommendations  Rolling walker with 5" wheels(recieved)    Recommendations for Other Services       Precautions / Restrictions Precautions Precautions: Fall Restrictions Weight Bearing Restrictions: No    Mobility  Bed Mobility Overal bed mobility: Modified Independent                Transfers Overall transfer level: Needs assistance Equipment used: Rolling walker (2 wheeled) Transfers: Sit to/from Stand;Stand Pivot Transfers Sit to Stand: Supervision Stand pivot transfers: Supervision          Ambulation/Gait Ambulation/Gait assistance: Supervision;Min guard Gait Distance (Feet): 300 Feet Assistive device: Rolling walker (2 wheeled) Gait Pattern/deviations: Step-through pattern Gait velocity: decreased    General Gait Details: 1 x 150ft with RW and SBA, 1 x 315ft with RW and Min guard assist   Stairs             Wheelchair Mobility    Modified Rankin (Stroke Patients Only)        Balance Overall balance assessment: Mild deficits observed, not formally tested                                          Cognition Arousal/Alertness: Awake/alert Behavior During Therapy: WFL for tasks assessed/performed Overall Cognitive Status: Within Functional Limits for tasks assessed                                        Exercises      General Comments        Pertinent Vitals/Pain Pain Assessment: No/denies pain    Home Living                      Prior Function            PT Goals (current goals can now be found in the care plan section) Acute Rehab PT Goals Patient Stated Goal: to get better and go home PT Goal Formulation: With patient Time For Goal Achievement: 11/08/19 Potential to Achieve Goals: Good Progress towards PT goals: Progressing toward goals    Frequency    Min 3X/week      PT Plan Current plan remains appropriate    Co-evaluation  AM-PAC PT "6 Clicks" Mobility   Outcome Measure  Help needed turning from your back to your side while in a flat bed without using bedrails?: None Help needed moving from lying on your back to sitting on the side of a flat bed without using bedrails?: None Help needed moving to and from a bed to a chair (including a wheelchair)?: A Little Help needed standing up from a chair using your arms (e.g., wheelchair or bedside chair)?: A Little Help needed to walk in hospital room?: A Little Help needed climbing 3-5 steps with a railing? : A Lot 6 Click Score: 19    End of Session   Activity Tolerance: Patient tolerated treatment well Patient left: in chair;with call bell/phone within reach Nurse Communication: Mobility status PT Visit Diagnosis: Other abnormalities of gait and mobility (R26.89);Muscle weakness (generalized) (M62.81)     Time: [id number] PT Time Calculation (min) (ACUTE ONLY): 29 min  Charges:  $Gait Training: 23-37  mins                     Horald Chestnut, PT    Delford Field 10/27/2019, 12:01 PM

## 2019-10-27 NOTE — Social Work (Signed)
Consult for HHPT. Multiple calls to pt's room, also called wife and dtr, but no answer. Will continue efforts. Of note, pt ambulated 300' SBA-Min guard assist with PT today. Pt has been issued a RW per PT note.   Wandra Feinstein, MSW, LCSW [phone] (GV coverage)

## 2019-10-28 LAB — CBC WITH DIFFERENTIAL/PLATELET
Abs Immature Granulocytes: 0.03 10*3/uL (ref 0.00–0.07)
Basophils Absolute: 0 10*3/uL (ref 0.0–0.1)
Basophils Relative: 0 %
Eosinophils Absolute: 0 10*3/uL (ref 0.0–0.5)
Eosinophils Relative: 0 %
HCT: 35.6 % — ABNORMAL LOW (ref 39.0–52.0)
Hemoglobin: 11.5 g/dL — ABNORMAL LOW (ref 13.0–17.0)
Immature Granulocytes: 0 %
Lymphocytes Relative: 18 %
Lymphs Abs: 1.4 10*3/uL (ref 0.7–4.0)
MCH: 27.6 pg (ref 26.0–34.0)
MCHC: 32.3 g/dL (ref 30.0–36.0)
MCV: 85.4 fL (ref 80.0–100.0)
Monocytes Absolute: 1 10*3/uL (ref 0.1–1.0)
Monocytes Relative: 13 %
Neutro Abs: 5.2 10*3/uL (ref 1.7–7.7)
Neutrophils Relative %: 69 %
Platelets: 362 10*3/uL (ref 150–400)
RBC: 4.17 MIL/uL — ABNORMAL LOW (ref 4.22–5.81)
RDW: 14.8 % (ref 11.5–15.5)
WBC: 7.7 10*3/uL (ref 4.0–10.5)
nRBC: 0 % (ref 0.0–0.2)

## 2019-10-28 LAB — COMPREHENSIVE METABOLIC PANEL
ALT: 21 U/L (ref 0–44)
AST: 27 U/L (ref 15–41)
Albumin: 2.7 g/dL — ABNORMAL LOW (ref 3.5–5.0)
Alkaline Phosphatase: 39 U/L (ref 38–126)
Anion gap: 10 (ref 5–15)
BUN: 23 mg/dL (ref 8–23)
CO2: 24 mmol/L (ref 22–32)
Calcium: 8.4 mg/dL — ABNORMAL LOW (ref 8.9–10.3)
Chloride: 102 mmol/L (ref 98–111)
Creatinine, Ser: 0.85 mg/dL (ref 0.61–1.24)
GFR calc Af Amer: >60 mL/min (ref >60)
GFR calc non Af Amer: >60 mL/min (ref >60)
Glucose, Bld: 156 mg/dL — ABNORMAL HIGH (ref 70–99)
Potassium: 4 mmol/L (ref 3.5–5.1)
Sodium: 136 mmol/L (ref 135–145)
Total Bilirubin: 0.4 mg/dL (ref 0.3–1.2)
Total Protein: 5.9 g/dL — ABNORMAL LOW (ref 6.5–8.1)

## 2019-10-28 LAB — GLUCOSE, CAPILLARY
Glucose-Capillary: 140 mg/dL — ABNORMAL HIGH (ref 70–99)
Glucose-Capillary: 275 mg/dL — ABNORMAL HIGH (ref 70–99)

## 2019-10-28 LAB — D-DIMER, QUANTITATIVE: D-Dimer, Quant: 0.83 ug/mL-FEU — ABNORMAL HIGH (ref 0.00–0.50)

## 2019-10-28 LAB — C-REACTIVE PROTEIN: CRP: 0.6 mg/dL (ref <1.0)

## 2019-10-28 LAB — MAGNESIUM: Magnesium: 2.1 mg/dL (ref 1.7–2.4)

## 2019-10-28 LAB — BRAIN NATRIURETIC PEPTIDE: B Natriuretic Peptide: 185.1 pg/mL — ABNORMAL HIGH (ref 0.0–100.0)

## 2019-10-28 NOTE — Care Management (Signed)
Case manager contacted Patient's daughter concerning setting up [place]. Referral called to [facility] with [facility].    Ricki Miller, RN Case Manager [phone]

## 2019-10-28 NOTE — Discharge Summary (Signed)
[name] [id number] DOB: 82/10/1935 DOA: 10/24/2019  PCP: Cassandria Anger, MD  Admit date: 10/24/2019  Discharge date: 10/28/2019  Admitted From: Home   Disposition:  Home   Recommendations for Outpatient Follow-up:   Follow up with PCP in 1-2 weeks  PCP Please obtain BMP/CBC, 2 view CXR in 1week,  (see Discharge instructions)   PCP Please follow up on the following pending results:    Home Health: PT,RN   Equipment/Devices: Walker  Consultations: None  Discharge Condition: Stable    CODE STATUS: Full    Diet Recommendation: Heart Healthy Low Carb  Diet Order            Diet - low sodium heart healthy        Diet heart healthy/carb modified Room service appropriate? Yes; Fluid consistency: Thin  Diet effective now               Chief Complaint  Patient presents with  . COVID  . Shortness of Breath  . Nausea  . Emesis     Brief history of present illness from the day of admission and additional interim summary    John Rabinovich McCandlessis a 84 y.o.malewith medical history significant forCAD s/p CABG, paroxysmal atrial fibrillation on Eliquis, type 2 diabetes, hypertension, and hyperlipidemia who presents to the ED for evaluation of progressive shortness of breath, was diagnosed with Covid.                                                                 Hospital Course   1. Acute Hypoxic Resp. Failure due to Acute Covid 19 Viral Pneumonitis during the ongoing 2020 Covid 19 Pandemic - he has moderate disease, he was been treated with IV steroids and Remdesivir, now down to room air and symptom-free, finished his course of treatment and will be discharged home this morning.     COVID-19 Labs  Recent Labs    10/26/19 0300 10/27/19 0515 10/28/19 0324  DDIMER 0.85* 0.75*  0.83*  CRP 0.6 0.5 0.6    Lab Results  Component Value Date   SARSCOV2NAA Detected (A) 10/18/2019    Hepatic Function Latest Ref Rng & Units 10/28/2019 10/27/2019 10/26/2019  Total Protein 6.5 - 8.1 g/dL 5.9(L) 6.2(L) 6.5  Albumin 3.5 - 5.0 g/dL 2.7(L) 2.9(L) 3.0(L)  AST 15 - 41 U/L _0 ALT 0 - 44 U/L _1 Alk Phosphatase 38 - 126 U/L 39 42 42  Total Bilirubin 0.3 - 1.2 mg/dL 0.4 0.7 0.7  Bilirubin, Direct 0.0 - 0.3 mg/dL - - -      2.  Paroxysmal atrial fibrillation: CHA2DS2-VASc score is 5. He is in sinus rhythm at time of admission. Continue Eliquis, Amio and B.Blocker.  3. CAD s/p CABG: Chronic and stable,  denies any chest pain. On ASA, Statin and Toprol was secondary prevention.   4. Hypertension: Continue home Toprol-XL.  5. Hyperlipidemia: -Continue simvastatin.  6. Mild Viral Leukopenia - monitor.  7. Type 2 diabetes: On Metformin and repaglinide as an outpatient. Used SSI here.  Lab Results  Component Value Date   HGBA1C 6.5 (H) 10/25/2019    Discharge diagnosis     Principal Problem:   Acute respiratory disease due to COVID-19 virus Active Problems:   Diabetes mellitus, type 2 (HCC)   Hypertension associated with diabetes ([place])   Paroxysmal atrial fibrillation (HCC)   Hyperlipidemia associated with type 2 diabetes mellitus (HCC)   CAD (coronary artery disease), native coronary artery   Acute respiratory failure with hypoxia ([place])    Discharge instructions    Discharge Instructions    Diet - low sodium heart healthy   Complete by: As directed    Discharge instructions   Complete by: As directed    Follow with Primary MD [name], Evie Lacks, MD in 7 days   Get CBC, CMP, 2 view Chest X ray -  checked next visit within 1 week by Primary MD   Activity: As tolerated with Full fall precautions use walker/cane & assistance as needed  Disposition Home    Diet: Heart Healthy - Low Carb  Special Instructions: If you have smoked or  chewed Tobacco  in the last 2 yrs please stop smoking, stop any regular Alcohol  and or any Recreational drug use.  On your next visit with your primary care physician please Get Medicines reviewed and adjusted.  Please request your Prim.MD to go over all Hospital Tests and Procedure/Radiological results at the follow up, please get all Hospital records sent to your Prim MD by signing hospital release before you go home.  If you experience worsening of your admission symptoms, develop shortness of breath, life threatening emergency, suicidal or homicidal thoughts you must seek medical attention immediately by calling 911 or calling your MD immediately  if symptoms less severe.  You Must read complete instructions/literature along with all the possible adverse reactions/side effects for all the Medicines you take and that have been prescribed to you. Take any new Medicines after you have completely understood and accpet all the possible adverse reactions/side effects.   Increase activity slowly   Complete by: As directed    MyChart COVID-19 home monitoring program   Complete by: Oct 28, 2019    Is the patient willing to use the [place] for home monitoring?: Yes   Temperature monitoring   Complete by: Oct 28, 2019    After how many days would you like to receive a notification of this patient's flowsheet entries?: 1      Discharge Medications   Allergies as of 10/28/2019   No Known Allergies     Medication List    STOP taking these medications   ibuprofen 200 MG tablet Commonly known as: ADVIL     TAKE these medications   amiodarone 200 MG tablet Commonly known as: PACERONE TAKE 1 TABLET BY MOUTH EVERY DAY   aspirin 81 MG tablet Take 81 mg by mouth daily.   cholecalciferol 1000 units tablet Commonly known as: VITAMIN D Take 1,000 Units by mouth 2 (two) times daily.   Eliquis 5 MG Tabs tablet Generic drug: apixaban TAKE 1 TABLET BY MOUTH TWICE A DAY What  changed: how much to take   gabapentin 100 MG capsule Commonly known as: NEURONTIN Take 1  capsule (100 mg total) by mouth 3 (three) times daily. Office visit needed before refills will be given   HYDROcodone-homatropine 5-1.5 MG/5ML syrup Commonly known as: HYCODAN Take 5 mLs by mouth every 8 (eight) hours as needed for cough.   metFORMIN 500 MG tablet Commonly known as: GLUCOPHAGE Take 1 tablet (500 mg total) by mouth 3 (three) times daily. Office visit needed before refills will be given   metoprolol succinate 25 MG 24 hr tablet Commonly known as: TOPROL-XL TAKE 1 TABLET BY MOUTH EVERY DAY   pantoprazole 40 MG tablet Commonly known as: PROTONIX Take 1 tablet (40 mg total) by mouth daily.   repaglinide 2 MG tablet Commonly known as: PRANDIN TAKE 1 TABLET (2 MG TOTAL) BY MOUTH 3 (THREE) TIMES DAILY BEFORE MEALS. What changed: See the new instructions.   simvastatin 10 MG tablet Commonly known as: ZOCOR Take 1 tablet (10 mg total) by mouth daily.   Systane Balance 0.6 % Soln Generic drug: Propylene Glycol Place 1 drop into both eyes 2 (two) times daily as needed (for dry eyes).            Durable Medical Equipment  (From admission, onward)         Start     Ordered   10/27/19 0912  DME Walker rolling 5 wheels  Once    Comments: 5 wheel  Question:  Patient needs a walker to treat with the following condition  Answer:  Weakness   10/27/19 0912          Follow-up Information    [name], Evie Lacks, MD. Schedule an appointment as soon as possible for a visit in 1 week(s).   Specialty: Internal Medicine Contact information: [place] [address] [phone]        Fay Records, MD .   Specialty: Cardiology Contact information: [address] [place] [address] [place] [address] [phone]           Major procedures and Radiology Reports - PLEASE review detailed and final reports thoroughly  -        DG Chest Port 1 View   Result Date: 10/24/2019 CLINICAL DATA:  Dyspnea, COVID-19 positive EXAM: PORTABLE CHEST 1 VIEW COMPARISON:  CTA chest and radiograph 05/10/2018 FINDINGS: Postsurgical changes related to prior CABG including intact and aligned sternotomy wires and multiple surgical clips projecting over the mediastinum. Cardiomegaly is similar to comparison. Widespread patchy peripheral and basilar predominant airspace and interstitial opacities. No pneumothorax. Small right pleural effusion. No visible left effusion the portion of the costophrenic sulcus is collimated. No acute osseous or soft tissue abnormality. Degenerative changes are present in the imaged spine and shoulders. IMPRESSION: 1. Widespread patchy peripheral and basilar predominant airspace opacities compatible with multifocal pneumonia including possible COVID-19 etiology. 2. Small right pleural effusion. 3. Stable cardiomegaly.  Post CABG changes. Electronically Signed   By: Lovena Le M.D.   On: 10/24/2019 20:53    Micro Results     Recent Results (from the past 240 hour(s))  Blood Culture (routine x 2)     Status: None (Preliminary result)   Collection Time: 10/24/19  8:27 PM   Specimen: BLOOD  Result Value Ref Range Status   Specimen Description   Final    BLOOD LEFT ANTECUBITAL Performed at [facility] [address] [place], [place] [address]    Special Requests   Final    BOTTLES DRAWN AEROBIC AND ANAEROBIC Blood Culture adequate volume Performed  at [facility], [place] [address], [place], [place] [address]    Culture   Final    NO GROWTH 4 DAYS Performed at [facility], [place] [address], [place], [place] [address]    Report Status PENDING  Incomplete  Blood Culture (routine x 2)     Status: None (Preliminary result)   Collection Time: 10/24/19 10:36 PM   Specimen: BLOOD  Result Value Ref Range Status   Specimen Description   Final    BLOOD RIGHT ANTECUBITAL Performed at [facility] [address], [place], [place] [address]    Special Requests   Final    BOTTLES DRAWN AEROBIC AND ANAEROBIC Blood Culture adequate volume Performed at [facility] [address] [place], [place] [address]    Culture   Final    NO GROWTH 3 DAYS Performed at [facility], [place] [address], [place], [place] [address]    Report Status PENDING  Incomplete    Today   Subjective    [name] today has no headache,no chest abdominal pain,no new weakness tingling or numbness, feels much better wants to go home today.     Objective   Blood pressure (!) 122/54, pulse 61, temperature 98.1 F (36.7 C), temperature source Oral, resp. rate 18, height 5' 8" (1.727 m), weight 97.6 kg, SpO2 93 %.   Intake/Output Summary (Last 24 hours) at 10/28/2019 0914 Last data filed at 10/28/2019 0454 Gross per 24 hour  Intake 645.13 ml  Output 1450 ml  Net -804.87 ml    Exam Awake Alert,   No new F.N deficits, Normal affect .AT,PERRAL Supple Neck,No JVD, No cervical lymphadenopathy appriciated.  Symmetrical Chest wall movement, Good air movement bilaterally, CTAB RRR,No Gallops,Rubs or new Murmurs, No Parasternal Heave +ve B.Sounds, Abd Soft, Non tender, No organomegaly appriciated, No rebound -guarding or rigidity. No Cyanosis, Clubbing or edema, No new Rash or bruise   Data Review   CBC w Diff:  Lab Results  Component Value Date   WBC 7.7 10/28/2019   HGB 11.5 (L) 10/28/2019   HGB 13.9 12/29/2018   HCT 35.6 (L) 10/28/2019   HCT 41.1 12/29/2018   PLT 362 10/28/2019   PLT 240 12/29/2018   LYMPHOPCT 18 10/28/2019   MONOPCT 13 10/28/2019   EOSPCT 0 10/28/2019   BASOPCT 0 10/28/2019    CMP:  Lab Results  Component Value Date   NA 136 10/28/2019   NA 142 12/29/2018   K 4.0 10/28/2019   CL 102 10/28/2019   CO2 24 10/28/2019   BUN 23 10/28/2019   BUN 16 12/29/2018   CREATININE 0.85 10/28/2019   PROT 5.9 (L) 10/28/2019    PROT 6.6 12/29/2018   ALBUMIN 2.7 (L) 10/28/2019   ALBUMIN 4.1 12/29/2018   BILITOT 0.4 10/28/2019   BILITOT 0.4 12/29/2018   ALKPHOS 39 10/28/2019   AST 27 10/28/2019   ALT 21 10/28/2019  .   Total Time in preparing paper work, data evaluation and todays exam - 75 minutes  Lala Lund M.D on 10/28/2019 at 9:14 AM  [facility]   Office  [phone]

## 2019-10-28 NOTE — TOC Transition Note (Signed)
Transition of Care [facility]) - CM/SW Discharge Note   Patient Details  Name: DEMENTRIUS ORZEL MRN: [id number] Date of Birth: 30-Jun-1935  Transition of Care [facility]) CM/SW Contact:  Ninfa Meeker, RN Phone Number: [phone] (working remotely) 10/28/2019, 11:14 AM   Clinical Narrative:   Case manager spoke with patient's daughter,Cathy concerning her dad's  discharge needs. Choice was offered for [facility]. Referral was called to Adela Lank, [facility]. Patient's wife is home recovering from [place] as well, daughter will be caring for both parents.       Final next level of care: Home w Home Health Services Barriers to Discharge: No Barriers Identified   Patient Goals and CMS Choice Patient states their goals for this hospitalization and ongoing recovery are:: per daughter; to get better CMS Medicare.gov Compare Post Acute Care list provided to:: Patient Represenative (must comment)(daughter) Choice offered to / list presented to : Adult Children  Discharge Placement                       Discharge Plan and Services In-house Referral: NA Discharge Planning Services: CM Consult Post Acute Care Choice: Home Health          DME Arranged: N/A         HH Arranged: PT HH Agency: [facility] Date [facility] Agency Contacted: 10/28/19 Time HH Agency Contacted: 1000 Representative spoke with at [place]: Adela Lank  Social Determinants of Health ([place]) Interventions     Readmission Risk Interventions No flowsheet data found.

## 2019-10-28 NOTE — Discharge Instructions (Signed)
Follow with Primary MD [name], Evie Lacks, MD in 7 days   Get CBC, CMP, 2 view Chest X ray -  checked next visit within 1 week by Primary MD   Activity: As tolerated with Full fall precautions use walker/cane & assistance as needed  Disposition Home    Diet: Heart Healthy - Low Carb  Special Instructions: If you have smoked or chewed Tobacco  in the last 2 yrs please stop smoking, stop any regular Alcohol  and or any Recreational drug use.  On your next visit with your primary care physician please Get Medicines reviewed and adjusted.  Please request your Prim.MD to go over all Hospital Tests and Procedure/Radiological results at the follow up, please get all Hospital records sent to your Prim MD by signing hospital release before you go home.  If you experience worsening of your admission symptoms, develop shortness of breath, life threatening emergency, suicidal or homicidal thoughts you must seek medical attention immediately by calling 911 or calling your MD immediately  if symptoms less severe.  You Must read complete instructions/literature along with all the possible adverse reactions/side effects for all the Medicines you take and that have been prescribed to you. Take any new Medicines after you have completely understood and accpet all the possible adverse reactions/side effects.       Person Under Monitoring Name: [name]  Location: [place] [address]   Infection Prevention Recommendations for Individuals Confirmed to have, or Being Evaluated for, 2019 Novel Coronavirus (COVID-19) Infection Who Receive Care at Home  Individuals who are confirmed to have, or are being evaluated for, COVID-19 should follow the prevention steps below until a healthcare provider or local or state health department says they can return to normal activities.  Stay home except to get medical care You should restrict activities outside your home, except for  getting medical care. Do not go to work, school, or public areas, and do not use public transportation or taxis.  Call ahead before visiting your doctor Before your medical appointment, call the healthcare provider and tell them that you have, or are being evaluated for, COVID-19 infection. This will help the healthcare provider's office take steps to keep other people from getting infected. Ask your healthcare provider to call the local or state health department.  Monitor your symptoms Seek prompt medical attention if your illness is worsening (e.g., difficulty breathing). Before going to your medical appointment, call the healthcare provider and tell them that you have, or are being evaluated for, COVID-19 infection. Ask your healthcare provider to call the local or state health department.  Wear a facemask You should wear a facemask that covers your nose and mouth when you are in the same room with other people and when you visit a healthcare provider. People who live with or visit you should also wear a facemask while they are in the same room with you.  Separate yourself from other people in your home As much as possible, you should stay in a different room from other people in your home. Also, you should use a separate bathroom, if available.  Avoid sharing household items You should not share dishes, drinking glasses, cups, eating utensils, towels, bedding, or other items with other people in your home. After using these items, you should wash them thoroughly with soap and water.  Cover your coughs and sneezes Cover your mouth and nose with a tissue when you cough or sneeze, or you can cough  or sneeze into your sleeve. Throw used tissues in a lined trash can, and immediately wash your hands with soap and water for at least 20 seconds or use an alcohol-based hand rub.  Wash your [facility] your hands often and thoroughly with soap and water for at least 20 seconds. You can use  an alcohol-based hand sanitizer if soap and water are not available and if your hands are not visibly dirty. Avoid touching your eyes, nose, and mouth with unwashed hands.   Prevention Steps for Caregivers and Household Members of Individuals Confirmed to have, or Being Evaluated for, COVID-19 Infection Being Cared for in the Home  If you live with, or provide care at home for, a person confirmed to have, or being evaluated for, COVID-19 infection please follow these guidelines to prevent infection:  Follow healthcare provider's instructions Make sure that you understand and can help the patient follow any healthcare provider instructions for all care.  Provide for the patient's basic needs You should help the patient with basic needs in the home and provide support for getting groceries, prescriptions, and other personal needs.  Monitor the patient's symptoms If they are getting sicker, call his or her medical provider and tell them that the patient has, or is being evaluated for, COVID-19 infection. This will help the healthcare provider's office take steps to keep other people from getting infected. Ask the healthcare provider to call the local or state health department.  Limit the number of people who have contact with the patient  If possible, have only one caregiver for the patient.  Other household members should stay in another home or place of residence. If this is not possible, they should stay  in another room, or be separated from the patient as much as possible. Use a separate bathroom, if available.  Restrict visitors who do not have an essential need to be in the home.  Keep older adults, very young children, and other sick people away from the patient Keep older adults, very young children, and those who have compromised immune systems or chronic health conditions away from the patient. This includes people with chronic heart, lung, or kidney conditions, diabetes,  and cancer.  Ensure good ventilation Make sure that shared spaces in the home have good air flow, such as from an air conditioner or an opened window, weather permitting.  Wash your hands often  Wash your hands often and thoroughly with soap and water for at least 20 seconds. You can use an alcohol based hand sanitizer if soap and water are not available and if your hands are not visibly dirty.  Avoid touching your eyes, nose, and mouth with unwashed hands.  Use disposable paper towels to dry your hands. If not available, use dedicated cloth towels and replace them when they become wet.  Wear a facemask and gloves  Wear a disposable facemask at all times in the room and gloves when you touch or have contact with the patient's blood, body fluids, and/or secretions or excretions, such as sweat, saliva, sputum, nasal mucus, vomit, urine, or feces.  Ensure the mask fits over your nose and mouth tightly, and do not touch it during use.  Throw out disposable facemasks and gloves after using them. Do not reuse.  Wash your hands immediately after removing your facemask and gloves.  If your personal clothing becomes contaminated, carefully remove clothing and launder. Wash your hands after handling contaminated clothing.  Place all used disposable facemasks, gloves, and other  waste in a lined container before disposing them with other household waste.  Remove gloves and wash your hands immediately after handling these items.  Do not share dishes, glasses, or other household items with the patient  Avoid sharing household items. You should not share dishes, drinking glasses, cups, eating utensils, towels, bedding, or other items with a patient who is confirmed to have, or being evaluated for, COVID-19 infection.  After the person uses these items, you should wash them thoroughly with soap and water.  Wash laundry thoroughly  Immediately remove and wash clothes or bedding that have blood,  body fluids, and/or secretions or excretions, such as sweat, saliva, sputum, nasal mucus, vomit, urine, or feces, on them.  Wear gloves when handling laundry from the patient.  Read and follow directions on labels of laundry or clothing items and detergent. In general, wash and dry with the warmest temperatures recommended on the label.  Clean all areas the individual has used often  Clean all touchable surfaces, such as counters, tabletops, doorknobs, bathroom fixtures, toilets, phones, keyboards, tablets, and bedside tables, every day. Also, clean any surfaces that may have blood, body fluids, and/or secretions or excretions on them.  Wear gloves when cleaning surfaces the patient has come in contact with.  Use a diluted bleach solution (e.g., dilute bleach with 1 part bleach and 10 parts water) or a household disinfectant with a label that says EPA-registered for coronaviruses. To make a bleach solution at home, add 1 tablespoon of bleach to 1 quart (4 cups) of water. For a larger supply, add  cup of bleach to 1 gallon (16 cups) of water.  Read labels of cleaning products and follow recommendations provided on product labels. Labels contain instructions for safe and effective use of the cleaning product including precautions you should take when applying the product, such as wearing gloves or eye protection and making sure you have good ventilation during use of the product.  Remove gloves and wash hands immediately after cleaning.  Monitor yourself for signs and symptoms of illness Caregivers and household members are considered close contacts, should monitor their health, and will be asked to limit movement outside of the home to the extent possible. Follow the monitoring steps for close contacts listed on the symptom monitoring form.   ? If you have additional questions, contact your local health department or call the epidemiologist on call at [phone] (available 24/7). ? This  guidance is subject to change. For the most up-to-date guidance from [facility], please refer to their website: YouBlogs.pl

## 2019-10-29 ENCOUNTER — Encounter (INDEPENDENT_AMBULATORY_CARE_PROVIDER_SITE_OTHER): Payer: Self-pay

## 2019-10-29 LAB — CULTURE, BLOOD (ROUTINE X 2)
Culture: NO GROWTH
Special Requests: ADEQUATE

## 2019-10-30 ENCOUNTER — Encounter (INDEPENDENT_AMBULATORY_CARE_PROVIDER_SITE_OTHER): Payer: Self-pay

## 2019-10-30 LAB — CULTURE, BLOOD (ROUTINE X 2)
Culture: NO GROWTH
Special Requests: ADEQUATE

## 2019-10-31 ENCOUNTER — Ambulatory Visit: Payer: Self-pay | Admitting: *Deleted

## 2019-10-31 ENCOUNTER — Telehealth: Payer: Self-pay

## 2019-10-31 ENCOUNTER — Encounter (INDEPENDENT_AMBULATORY_CARE_PROVIDER_SITE_OTHER): Payer: Self-pay

## 2019-10-31 NOTE — Telephone Encounter (Signed)
Pt was discharged from the hospital after being admitted with the COVID-19.   He is calling in wanting to know if he needs to quarantine? Continue to quarantine and if so how long?   I looked through his hospital chart and did not see anything mentioned about how long she should be quarantined or if he still needed to be quarantined after being treated for the COVID-19 in the hospital.    I requested the agent send his call to Dr. Judeen Hammans office for an  Answer since I could not locate any information to answer his question.  I did not speak with the pt.

## 2019-10-31 NOTE — Telephone Encounter (Signed)
Copied from [place] [id number]. Topic: General - Other >> Oct 31, 2019 10:30 AM Rainey Pines A wrote: Patient would like a callback in regards to if he should be quarantining and some other follow up questions in regards to his recent discharge from hospital for covid 19. I spoke with triage nurse Jenny Reichmann who stated that patients discharge summary did not have clear instructions and to send message to office. Please advise

## 2019-11-01 ENCOUNTER — Encounter (INDEPENDENT_AMBULATORY_CARE_PROVIDER_SITE_OTHER): Payer: Self-pay

## 2019-11-01 ENCOUNTER — Other Ambulatory Visit: Payer: Self-pay

## 2019-11-01 MED ORDER — ACCU-CHEK AVIVA PLUS VI STRP: ORAL_STRIP | 3 refills | Status: DC

## 2019-11-01 MED ORDER — ACCU-CHEK SOFTCLIX LANCETS MISC: 3 refills | Status: AC

## 2019-11-01 MED ORDER — ACCU-CHEK AVIVA PLUS W/DEVICE KIT: PACK | 0 refills | Status: AC

## 2019-11-01 NOTE — Telephone Encounter (Signed)
Pt.notified

## 2019-11-01 NOTE — Telephone Encounter (Signed)
Quarantine x 2 more days Thx

## 2019-11-02 ENCOUNTER — Encounter (INDEPENDENT_AMBULATORY_CARE_PROVIDER_SITE_OTHER): Payer: Self-pay

## 2019-11-02 ENCOUNTER — Other Ambulatory Visit: Payer: Self-pay | Admitting: Internal Medicine

## 2019-11-03 ENCOUNTER — Other Ambulatory Visit: Payer: Self-pay | Admitting: Internal Medicine

## 2019-11-06 ENCOUNTER — Other Ambulatory Visit: Payer: Self-pay | Admitting: Internal Medicine

## 2019-11-25 ENCOUNTER — Other Ambulatory Visit: Payer: Self-pay | Admitting: Internal Medicine

## 2019-11-27 ENCOUNTER — Other Ambulatory Visit: Payer: Self-pay | Admitting: Internal Medicine

## 2019-11-27 MED ORDER — PENICILLIN V POTASSIUM 500 MG PO TABS: 500.0000 mg | ORAL_TABLET | Freq: Four times a day (QID) | ORAL | 0 refills | Status: DC

## 2019-11-30 ENCOUNTER — Other Ambulatory Visit (HOSPITAL_COMMUNITY): Payer: Self-pay | Admitting: Nurse Practitioner

## 2019-11-30 NOTE — Telephone Encounter (Signed)
Age 85, weight 97,6kg, last OV July 2020, afib indication, SCr 0.85 on 10/28/19

## 2019-12-13 ENCOUNTER — Other Ambulatory Visit: Payer: Self-pay

## 2019-12-21 NOTE — Progress Notes (Signed)
Cardiology Office Note   Date:  12/22/2019   ID:  [name], DOB 1934-12-29, MRN [id number]  PCP:  Cassandria Anger, MD  Cardiologist:   [name], MD   F/u of atrial fibrillaiton        History of Present Illness: [name] is a 85 y.o. male with a history ofCAD, (s/p CABG 01/2011 LIMA to LAD; SVG to Cx),diabetes, hypertension, nephrolithiasis, hyperlipidemia, and atrial fibrillaiton   He underwetn cardioversion last year    Failed   Underwent amiodarone load.   Converted   Went back into atrial fib   .. .  Repeate echo    LVEF est at 40 to 45%   By my review it appeared greater     Since seen he says it feels like heart is doing good   Still with SOB with activity   Myovue in Nov 2019 was normal  I saw the pt in clinic in Feb 2020 and then as televisit during the summer    She was seen by Kathleen Argue in June for near syncopePt symptomatic with stadding    BP after was 190s  Monitor placed showed no arrhythmias  Buren Kos in July.  Symptoms had gone  Since seen the patient was admitted in December with Covid he was hospitalized for 4 days.  His wife also was hospitalized.  The patient continues to complain of dyspnea with exertion.  He is not too active he says he just gives out.  Denies chest pain.  Says he is watching his salt intake some.  No significant edema. He deneis dizzienss   Current Meds  Medication Sig  . Accu-Chek Softclix Lancets lancets Use to check blood sugar daily DX:E11.59  . amiodarone (PACERONE) 200 MG tablet TAKE 1 TABLET BY MOUTH EVERY DAY (Patient taking differently: Take 200 mg by mouth daily. )  . aspirin 81 MG tablet Take 81 mg by mouth daily.   . Blood Glucose Monitoring Suppl (ACCU-CHEK AVIVA PLUS) w/Device KIT Use to check blood sugar daily DX:E11.59  . cholecalciferol (VITAMIN D) 1000 units tablet Take 1,000 Units by mouth 2 (two) times daily.  Marland Kitchen ELIQUIS 5 MG TABS tablet TAKE 1 TABLET BY MOUTH TWICE A DAY  . gabapentin  (NEURONTIN) 100 MG capsule Take 1 capsule (100 mg total) by mouth 3 (three) times daily. Office visit needed before refills will be given  . glucose blood (ACCU-CHEK AVIVA PLUS) test strip Use to check blood sugar daily DX:E11.59  . HYDROcodone-homatropine (HYCODAN) 5-1.5 MG/5ML syrup Take 5 mLs by mouth every 8 (eight) hours as needed for cough.  . metFORMIN (GLUCOPHAGE) 500 MG tablet Take 1 tablet (500 mg total) by mouth 3 (three) times daily.  . metoprolol succinate (TOPROL-XL) 25 MG 24 hr tablet TAKE 1 TABLET BY MOUTH EVERY DAY (Patient taking differently: Take 25 mg by mouth daily. )  . pantoprazole (PROTONIX) 40 MG tablet Take 1 tablet (40 mg total) by mouth daily.  Marland Kitchen Propylene Glycol (SYSTANE BALANCE) 0.6 % SOLN Place 1 drop into both eyes 2 (two) times daily as needed (for dry eyes).  . repaglinide (PRANDIN) 2 MG tablet TAKE 1 TABLET BY MOUTH 3 TIMES DAILY BEFORE MEALS.  . simvastatin (ZOCOR) 10 MG tablet Take 1 tablet (10 mg total) by mouth daily.     Allergies:   Patient has no known allergies.   Past Medical History:  Diagnosis Date  . Arthritis   . Atrial fibrillation ([place])  post op; amiodarone and coumadin continued for 3 mos post op  . CARCINOMA, SKIN, SQUAMOUS CELL 01/07/2010  . Cataract    beginning of cataracts  . COLONIC POLYPS, HX OF 04/26/2007  . Coronary artery disease    s/p CABG 3/12: L-LAD, S-CFX (Dr. Roxy Manns); EF 45% at cath prior to CABG  . DIABETES MELLITUS, TYPE II 04/26/2007  . Hyperlipidemia   . HYPERTENSION 04/26/2007  . NEPHROLITHIASIS, HX OF 04/26/2007  . SBO (small bowel obstruction) ([place]) 10/2015   Partial   . Shortness of breath dyspnea    with exertion  . Unspecified hearing loss 12/18/2008    Past Surgical History:  Procedure Laterality Date  . BACK SURGERY     disectomy  . CARDIOVERSION N/A 06/08/2018   Procedure: CARDIOVERSION;  Surgeon: Buford Dresser, MD;  Location: [facility] ENDOSCOPY;  Service: Cardiovascular;  Laterality: N/A;  .  CARDIOVERSION N/A 07/25/2018   Procedure: CARDIOVERSION;  Surgeon: Fay Records, MD;  Location: [facility];  Service: Cardiovascular;  Laterality: N/A;  . COLONOSCOPY    . CORONARY ARTERY BYPASS GRAFT  01/12/2011  . LUMBAR LAMINECTOMY/DECOMPRESSION MICRODISCECTOMY N/A 05/08/2015   Procedure: LUMBAR LAMINECTOMY/DECOMPRESSION MICRODISCECTOMY 1 LEVEL Lumbar four five;  Surgeon: Melina Schools, MD;  Location: [facility];  Service: Orthopedics;  Laterality: N/A;  . TOTAL HIP ARTHROPLASTY Left    left on 04/15/07  . TYMPANIC MEMBRANE REPAIR  1981     Social History:  The patient  reports that he quit smoking about 38 years ago. He has never used smokeless tobacco. He reports that he does not drink alcohol or use drugs.   Family History:  The patient's family history includes Breast cancer in his sister; Cancer in his brother and sister; Heart disease in his brother and father; Heart failure in his mother.    ROS:  Please see the history of present illness. All other systems are reviewed and  Negative to the above problem except as noted.    PHYSICAL EXAM: VS:  BP 136/74   Pulse 68   Ht '5\' 8"'  (1.727 m)   Wt 230 lb (104.3 kg)   BMI 34.97 kg/m   GEN:  Morbidly obese 85 yo in no acute distress  HEENT: normal  Neck: JVP is elevated.  But 11 cm Cardiac:  Regular rate and rhytm; no murmurs, rubs, or gallops,  Trivial right lower extremity edema 1+ left lower extremity edema Respiratory:  clear to auscultation bilaterally, normal work of breathing soft Rales at bases.   GI: soft, nontender, nondistended, + BS  No hepatomegaly  MS: no deformity Moving all extremities   Skin: warm and dry, no rash Neuro:  Strength and sensation are intact Psych: euthymic mood, full affect   EKG:  EKG Is ordered today sinus rhythm.  68 bpm.  First-degree AV block with a PR interval of 236 ms Lipid Panel    Component Value Date/Time   CHOL 118 12/29/2018 1151   TRIG 130 10/24/2019 2027   TRIG 78 11/05/2006 1123    HDL 51 12/29/2018 1151   CHOLHDL 2.3 12/29/2018 1151   CHOLHDL 2.5 01/17/2016 1209   VLDL 20 01/17/2016 1209   LDLCALC 47 12/29/2018 1151      Wt Readings from Last 3 Encounters:  12/22/19 230 lb (104.3 kg)  10/25/19 215 lb 1 oz (97.6 kg)  06/16/19 234 lb 6.4 oz (106.3 kg)      ASSESSMENT AND PLAN:  1 Atrial fibrillation   currently in sinus rhythm.  He  is on amiodarone.  Would continue at current dose for now.  We will check a sed rate.  2  Dyspnea.  Patient has some increased fluid on exam today.  I will check labs.  Note walking in the back hallway his sats stayed good at 95.  We will also check a sed rate given his amiodarone use.  Consider PFTs.  2  CAD   Pt s/p CABG in 2012 Myovue scan in November 2020 was normal.   I am not convinced of active angina but always need to be on the look out  3  HTN   BP is okay continue.  4   HL keep on simvastatin  5  Dizziness  Pt denies symptoms    6 Morbid obesity   Wt remains elevated       Follow-up end of March.  Current medicines are reviewed at length with the patient today.  The patient does not have concerns regarding medicines.  Signed, [name], MD  12/22/2019 12:07 PM    [facility] [place], [place], [place]  [address] Phone: [phone]; Fax: [phone]

## 2019-12-22 ENCOUNTER — Encounter: Payer: Self-pay | Admitting: Internal Medicine

## 2019-12-22 ENCOUNTER — Ambulatory Visit (INDEPENDENT_AMBULATORY_CARE_PROVIDER_SITE_OTHER): Payer: Medicare Other | Admitting: Internal Medicine

## 2019-12-22 ENCOUNTER — Other Ambulatory Visit: Payer: Self-pay

## 2019-12-22 VITALS — BP 136/74 | HR 68 | Ht 68.0 in | Wt 230.0 lb

## 2019-12-22 DIAGNOSIS — I480 Paroxysmal atrial fibrillation: Secondary | ICD-10-CM | POA: Diagnosis not present

## 2019-12-22 DIAGNOSIS — I10 Essential (primary) hypertension: Secondary | ICD-10-CM

## 2019-12-22 DIAGNOSIS — I2510 Atherosclerotic heart disease of native coronary artery without angina pectoris: Secondary | ICD-10-CM | POA: Diagnosis not present

## 2019-12-22 DIAGNOSIS — R0602 Shortness of breath: Secondary | ICD-10-CM | POA: Diagnosis not present

## 2019-12-22 NOTE — Patient Instructions (Addendum)
Medication Instructions:  Your physician recommends that you continue on your current medications as directed. Please refer to the Current Medication list given to you today.  *If you need a refill on your cardiac medications before your next appointment, please call your pharmacy*  Lab Work: BMET, Liver, CBC, Pro BNP, ESR and TSH today  If you have labs (blood work) drawn today and your tests are completely normal, you will receive your results only by: Marland Kitchen MyChart Message (if you have MyChart) OR . A paper copy in the mail If you have any lab test that is abnormal or we need to change your treatment, we will call you to review the results.  Testing/Procedures: None  Follow-Up: At [facility], you and your health needs are our priority.  As part of our continuing mission to provide you with exceptional heart care, we have created designated Provider Care Teams.  These Care Teams include your primary Cardiologist (physician) and Advanced Practice Providers (APPs -  Physician Assistants and Nurse Practitioners) who all work together to provide you with the care you need, when you need it.  Your next appointment:   6 week(s)  The format for your next appointment:   In Person  Provider:   You may see [name], MD or one of the following Advanced Practice Providers on your designated Care Team:    Richardson Dopp, PA-C  [place], [place]  Daune Perch, NP   Other Instructions

## 2019-12-23 LAB — CBC
Hematocrit: 35.9 % — ABNORMAL LOW (ref 37.5–51.0)
Hemoglobin: 11.5 g/dL — ABNORMAL LOW (ref 13.0–17.7)
MCH: 26.4 pg — ABNORMAL LOW (ref 26.6–33.0)
MCHC: 32 g/dL (ref 31.5–35.7)
MCV: 83 fL (ref 79–97)
Platelets: 267 10*3/uL (ref 150–450)
RBC: 4.35 x10E6/uL (ref 4.14–5.80)
RDW: 14.3 % (ref 11.6–15.4)
WBC: 6.6 10*3/uL (ref 3.4–10.8)

## 2019-12-23 LAB — BASIC METABOLIC PANEL
BUN/Creatinine Ratio: 15 (ref 10–24)
BUN: 17 mg/dL (ref 8–27)
CO2: 21 mmol/L (ref 20–29)
Calcium: 8.8 mg/dL (ref 8.6–10.2)
Chloride: 104 mmol/L (ref 96–106)
Creatinine, Ser: 1.15 mg/dL (ref 0.76–1.27)
GFR calc Af Amer: 68 mL/min/{1.73_m2} (ref >59)
GFR calc non Af Amer: 59 mL/min/{1.73_m2} — ABNORMAL LOW (ref >59)
Glucose: 113 mg/dL — ABNORMAL HIGH (ref 65–99)
Potassium: 4.4 mmol/L (ref 3.5–5.2)
Sodium: 137 mmol/L (ref 134–144)

## 2019-12-23 LAB — PRO B NATRIURETIC PEPTIDE: NT-Pro BNP: 389 pg/mL (ref 0–486)

## 2019-12-23 LAB — HEPATIC FUNCTION PANEL
ALT: 23 IU/L (ref 0–44)
AST: 27 IU/L (ref 0–40)
Albumin: 4.1 g/dL (ref 3.6–4.6)
Alkaline Phosphatase: 62 IU/L (ref 39–117)
Bilirubin Total: 0.3 mg/dL (ref 0.0–1.2)
Bilirubin, Direct: 0.12 mg/dL (ref 0.00–0.40)
Total Protein: 6.8 g/dL (ref 6.0–8.5)

## 2019-12-23 LAB — SEDIMENTATION RATE: Sed Rate: 39 mm/hr — ABNORMAL HIGH (ref 0–30)

## 2019-12-23 LAB — TSH: TSH: 3.31 u[IU]/mL (ref 0.450–4.500)

## 2020-01-03 ENCOUNTER — Telehealth: Payer: Self-pay | Admitting: *Deleted

## 2020-01-03 DIAGNOSIS — I2510 Atherosclerotic heart disease of native coronary artery without angina pectoris: Secondary | ICD-10-CM

## 2020-01-03 DIAGNOSIS — I480 Paroxysmal atrial fibrillation: Secondary | ICD-10-CM

## 2020-01-03 MED ORDER — POTASSIUM CHLORIDE CRYS ER 20 MEQ PO TBCR: 20.0000 meq | EXTENDED_RELEASE_TABLET | ORAL | 3 refills | Status: DC

## 2020-01-03 MED ORDER — FUROSEMIDE 40 MG PO TABS: 40.0000 mg | ORAL_TABLET | ORAL | 3 refills | Status: DC

## 2020-01-03 NOTE — Telephone Encounter (Signed)
Spoke with patient. Reviewed lab results and recommendations. He will take lasix and potassium every other day and come in on 3/4 for repeat BMET, SED Rate. Follow up scheduled with Dr. Harrington Challenger 3/22.

## 2020-01-03 NOTE — Telephone Encounter (Signed)
-----   Message from Fay Records, MD sent at 12/24/2019  7:23 PM EST ----- Electrolytes and kidney function are OK  Liver function are OK CBC is stable   Hgb is midly decreased   Stable for a couple months Sed rate is very mildly increased   may be due to recovery from [place]   Would check in 1 month Thyroid function is normal BNP is normal but he appeared to have increased volume on exam    I would give 40 lasix with 20 KCL every other day for 2 wks. F/U BMET in 2 wks

## 2020-01-17 DIAGNOSIS — H40021 Open angle with borderline findings, high risk, right eye: Secondary | ICD-10-CM | POA: Diagnosis not present

## 2020-01-17 DIAGNOSIS — H25011 Cortical age-related cataract, right eye: Secondary | ICD-10-CM | POA: Diagnosis not present

## 2020-01-17 DIAGNOSIS — H2511 Age-related nuclear cataract, right eye: Secondary | ICD-10-CM | POA: Diagnosis not present

## 2020-01-17 DIAGNOSIS — H401121 Primary open-angle glaucoma, left eye, mild stage: Secondary | ICD-10-CM | POA: Diagnosis not present

## 2020-01-18 ENCOUNTER — Other Ambulatory Visit: Payer: Self-pay

## 2020-01-18 ENCOUNTER — Other Ambulatory Visit: Payer: Self-pay | Admitting: Internal Medicine

## 2020-01-18 ENCOUNTER — Other Ambulatory Visit: Payer: Medicare Other | Admitting: *Deleted

## 2020-01-18 DIAGNOSIS — I2510 Atherosclerotic heart disease of native coronary artery without angina pectoris: Secondary | ICD-10-CM

## 2020-01-18 DIAGNOSIS — I480 Paroxysmal atrial fibrillation: Secondary | ICD-10-CM | POA: Diagnosis not present

## 2020-01-19 LAB — BASIC METABOLIC PANEL
BUN/Creatinine Ratio: 13 (ref 10–24)
BUN: 17 mg/dL (ref 8–27)
CO2: 20 mmol/L (ref 20–29)
Calcium: 9.1 mg/dL (ref 8.6–10.2)
Chloride: 104 mmol/L (ref 96–106)
Creatinine, Ser: 1.29 mg/dL — ABNORMAL HIGH (ref 0.76–1.27)
GFR calc Af Amer: 59 mL/min/{1.73_m2} — ABNORMAL LOW (ref >59)
GFR calc non Af Amer: 51 mL/min/{1.73_m2} — ABNORMAL LOW (ref >59)
Glucose: 170 mg/dL — ABNORMAL HIGH (ref 65–99)
Potassium: 4.3 mmol/L (ref 3.5–5.2)
Sodium: 139 mmol/L (ref 134–144)

## 2020-01-19 LAB — SEDIMENTATION RATE: Sed Rate: 11 mm/hr (ref 0–30)

## 2020-02-05 ENCOUNTER — Other Ambulatory Visit: Payer: Self-pay

## 2020-02-05 ENCOUNTER — Ambulatory Visit: Payer: Medicare Other | Admitting: Internal Medicine

## 2020-02-05 VITALS — BP 148/64 | HR 73 | Ht 68.0 in | Wt 227.2 lb

## 2020-02-05 DIAGNOSIS — I2510 Atherosclerotic heart disease of native coronary artery without angina pectoris: Secondary | ICD-10-CM | POA: Diagnosis not present

## 2020-02-05 DIAGNOSIS — R0602 Shortness of breath: Secondary | ICD-10-CM | POA: Diagnosis not present

## 2020-02-05 NOTE — Patient Instructions (Signed)
Medication Instructions:  No changes *If you need a refill on your cardiac medications before your next appointment, please call your pharmacy*   Lab Work: BMET, BNP  If you have labs (blood work) drawn today and your tests are completely normal, you will receive your results only by: Marland Kitchen MyChart Message (if you have MyChart) OR . A paper copy in the mail If you have any lab test that is abnormal or we need to change your treatment, we will call you to review the results.   Testing/Procedures: none   Follow-Up: At [facility], you and your health needs are our priority.  As part of our continuing mission to provide you with exceptional heart care, we have created designated Provider Care Teams.  These Care Teams include your primary Cardiologist (physician) and Advanced Practice Providers (APPs -  Physician Assistants and Nurse Practitioners) who all work together to provide you with the care you need, when you need it.  Your next appointment:   6 month(s)  The format for your next appointment:   Either In Person or Virtual  Provider:   You may see [name], MD or one of the following Advanced Practice Providers on your designated Care Team:    Richardson Dopp, PA-C  [place], [place]  Daune Perch, NP    Other Instructions

## 2020-02-05 NOTE — Progress Notes (Signed)
Cardiology Office Note   Date:  02/05/2020   ID:  [name], DOB 20-Jan-1935, MRN [id number]  PCP:  Cassandria Anger, MD  Cardiologist:   [name], MD   F/u of CAD and atrial fibrillaiton        History of Present Illness: [name] is a 85 y.o. male with a history ofCAD, (s/p CABG 01/2011 LIMA to LAD; SVG to Cx),diabetes, hypertension, nephrolithiasis, hyperlipidemia, and atrial fibrillaiton  He underwent cardioversion in past that [place] and then had  amiodarone load.   Converted   Went back into atrial fib   .. .  Echo in Sept 2019 LVEF est at 40 to 45%   By my review it appeared greater    Myovue in November 2019 was normal    The pt had near syncope in June 2020  Monitor showed no arrhythmias  Symptoms improved/resolved    In Dec 2020 he and his wife were hospitalized with COVID    I saw him in Feb  He complained of DOE   Giving out easier    Labs done    I recomm adding lasix 25m alternate with 20 mg  Since then he says his breathing is OK   Denies CP  No dizziness   Current Meds  Medication Sig  . Accu-Chek Softclix Lancets lancets Use to check blood sugar daily DX:E11.59  . amiodarone (PACERONE) 200 MG tablet TAKE 1 TABLET BY MOUTH EVERY DAY (Patient taking differently: Take 200 mg by mouth daily. )  . aspirin 81 MG tablet Take 81 mg by mouth daily.   . Blood Glucose Monitoring Suppl (ACCU-CHEK AVIVA PLUS) w/Device KIT Use to check blood sugar daily DX:E11.59  . cholecalciferol (VITAMIN D) 1000 units tablet Take 1,000 Units by mouth 2 (two) times daily.  .Marland KitchenELIQUIS 5 MG TABS tablet TAKE 1 TABLET BY MOUTH TWICE A DAY  . furosemide (LASIX) 40 MG tablet Take 1 tablet (40 mg total) by mouth every other day.  . gabapentin (NEURONTIN) 100 MG capsule Take 1 capsule (100 mg total) by mouth 3 (three) times daily. Office visit needed before refills will be given  . glucose blood (ACCU-CHEK AVIVA PLUS) test strip Use to check blood sugar daily DX:E11.59    . metFORMIN (GLUCOPHAGE) 500 MG tablet Take 1 tablet (500 mg total) by mouth 3 (three) times daily.  . metoprolol succinate (TOPROL-XL) 25 MG 24 hr tablet TAKE 1 TABLET BY MOUTH EVERY DAY (Patient taking differently: Take 25 mg by mouth daily. )  . pantoprazole (PROTONIX) 40 MG tablet Take 1 tablet (40 mg total) by mouth daily.  . potassium chloride SA (KLOR-CON) 20 MEQ tablet Take 1 tablet (20 mEq total) by mouth every other day.  .Marland KitchenPropylene Glycol (SYSTANE BALANCE) 0.6 % SOLN Place 1 drop into both eyes 2 (two) times daily as needed (for dry eyes).  . repaglinide (PRANDIN) 2 MG tablet TAKE 1 TABLET BY MOUTH 3 TIMES DAILY BEFORE MEALS.  . simvastatin (ZOCOR) 10 MG tablet TAKE 1 TABLET BY MOUTH EVERY DAY     Allergies:   Patient has no known allergies.   Past Medical History:  Diagnosis Date  . Arthritis   . [place]rial fibrillation ([place]    post op; amiodarone and coumadin continued for 3 mos post op  . CARCINOMA, SKIN, SQUAMOUS CELL 01/07/2010  . Cataract    beginning of cataracts  . COLONIC POLYPS, HX OF 04/26/2007  . Coronary artery disease  s/p CABG 3/12: L-LAD, S-CFX (Dr. Roxy Manns); EF 45% at cath prior to CABG  . DIABETES MELLITUS, TYPE II 04/26/2007  . Hyperlipidemia   . HYPERTENSION 04/26/2007  . NEPHROLITHIASIS, HX OF 04/26/2007  . SBO (small bowel obstruction) ([place]) 10/2015   Partial   . Shortness of breath dyspnea    with exertion  . Unspecified hearing loss 12/18/2008    Past Surgical History:  Procedure Laterality Date  . BACK SURGERY     disectomy  . CARDIOVERSION N/A 06/08/2018   Procedure: CARDIOVERSION;  Surgeon: Buford Dresser, MD;  Location: [facility] ENDOSCOPY;  Service: Cardiovascular;  Laterality: N/A;  . CARDIOVERSION N/A 07/25/2018   Procedure: CARDIOVERSION;  Surgeon: Fay Records, MD;  Location: [facility];  Service: Cardiovascular;  Laterality: N/A;  . COLONOSCOPY    . CORONARY ARTERY BYPASS GRAFT  01/12/2011  . LUMBAR LAMINECTOMY/DECOMPRESSION  MICRODISCECTOMY N/A 05/08/2015   Procedure: LUMBAR LAMINECTOMY/DECOMPRESSION MICRODISCECTOMY 1 LEVEL Lumbar four five;  Surgeon: Melina Schools, MD;  Location: [facility];  Service: Orthopedics;  Laterality: N/A;  . TOTAL HIP ARTHROPLASTY Left    left on 04/15/07  . TYMPANIC MEMBRANE REPAIR  1981     Social History:  The patient  reports that he quit smoking about 39 years ago. He has never used smokeless tobacco. He reports that he does not drink alcohol or use drugs.   Family History:  The patient's family history includes Breast cancer in his sister; Cancer in his brother and sister; Heart disease in his brother and father; Heart failure in his mother.    ROS:  Please see the history of present illness. All other systems are reviewed and  Negative to the above problem except as noted.    PHYSICAL EXAM: VS:  BP (!) 148/64   Pulse 73   Ht '5\' 8"'  (1.727 m)   Wt 227 lb 3.2 oz (103.1 kg)   BMI 34.55 kg/m   GEN:  Morbidly obese 85 yo in no acute distress  HEENT: normal  Neck: JVP is normal   Cardiac:  Regular rate and rhytm; no murmurs, rubs, or gallops,  Triv LE edema   Respiratory:  clear to auscultation bilaterally, normal work of breathing soft Rales at bases.   GI: soft, nontender, nondistended, + BS  No hepatomegaly  MS: no deformity Moving all extremities   Skin: warm and dry, no rash Neuro:  Strength and sensation are intact Psych: euthymic mood, full affect   EKG:  EKG Is ordered today   SR 73 bpm  First degree AV block  PR 236 msec    Lipid Panel    Component Value Date/Time   CHOL 118 12/29/2018 1151   TRIG 130 10/24/2019 2027   TRIG 78 11/05/2006 1123   HDL 51 12/29/2018 1151   CHOLHDL 2.3 12/29/2018 1151   CHOLHDL 2.5 01/17/2016 1209   VLDL 20 01/17/2016 1209   LDLCALC 47 12/29/2018 1151      Wt Readings from Last 3 Encounters:  02/05/20 227 lb 3.2 oz (103.1 kg)  12/22/19 230 lb (104.3 kg)  10/25/19 215 lb 1 oz (97.6 kg)      ASSESSMENT AND PLAN:  1 Atrial  fibrillation   REmainis in SR on amiodarone. Currently on 200 mg   Continue Eliquis  2  Dyspnea. PT seems to be improved clinically  though his history is difficult   Keep on same regimen  Check BMET and BNP   ]  2  CAD   Pt s/p  CABG in 2012 Myovue scan in November 2020 was normal.   I am not convinced of active angina but always need to be on the look out  3  HTN   BP is  Fair  WOuld continue current regimen  4   HL Continue simvastatin  LDL in Feb 2020 ws 66    5  Hx of d izziness  Pt denies   6 Morbid obesity   Wt remains elevated   Watch diet   Stay active    Follow-up end of March.  Current medicines are reviewed at length with the patient today.  The patient does not have concerns regarding medicines.  Signed, [name], MD  02/05/2020 2:25 PM    [facility] [place], [place], [place]  [address] Phone: [phone]; Fax: [phone]

## 2020-02-06 ENCOUNTER — Other Ambulatory Visit: Payer: Self-pay | Admitting: *Deleted

## 2020-02-06 LAB — BASIC METABOLIC PANEL
BUN/Creatinine Ratio: 16 (ref 10–24)
BUN: 21 mg/dL (ref 8–27)
CO2: 22 mmol/L (ref 20–29)
Calcium: 9.5 mg/dL (ref 8.6–10.2)
Chloride: 105 mmol/L (ref 96–106)
Creatinine, Ser: 1.32 mg/dL — ABNORMAL HIGH (ref 0.76–1.27)
GFR calc Af Amer: 57 mL/min/{1.73_m2} — ABNORMAL LOW (ref >59)
GFR calc non Af Amer: 50 mL/min/{1.73_m2} — ABNORMAL LOW (ref >59)
Glucose: 147 mg/dL — ABNORMAL HIGH (ref 65–99)
Potassium: 4.7 mmol/L (ref 3.5–5.2)
Sodium: 142 mmol/L (ref 134–144)

## 2020-02-06 LAB — PRO B NATRIURETIC PEPTIDE: NT-Pro BNP: 151 pg/mL (ref 0–486)

## 2020-02-06 MED ORDER — FUROSEMIDE 40 MG PO TABS: 40.0000 mg | ORAL_TABLET | ORAL | 3 refills | Status: DC

## 2020-02-08 NOTE — Addendum Note (Signed)
Addended by: Patterson Hammersmith A on: 02/08/2020 05:00 PM   Modules accepted: Orders

## 2020-02-18 ENCOUNTER — Other Ambulatory Visit: Payer: Self-pay | Admitting: Internal Medicine

## 2020-02-29 DIAGNOSIS — H40021 Open angle with borderline findings, high risk, right eye: Secondary | ICD-10-CM | POA: Diagnosis not present

## 2020-02-29 DIAGNOSIS — H401121 Primary open-angle glaucoma, left eye, mild stage: Secondary | ICD-10-CM | POA: Diagnosis not present

## 2020-04-22 ENCOUNTER — Telehealth: Payer: Self-pay

## 2020-04-22 DIAGNOSIS — R0602 Shortness of breath: Secondary | ICD-10-CM

## 2020-04-22 NOTE — Telephone Encounter (Signed)
New message    The patient voiced a referral to see Pulmonary MD  Referral : Pulmonary   Reason: Short of Breath  Facility: [facility]

## 2020-04-23 NOTE — Telephone Encounter (Signed)
Okay.  Thanks.

## 2020-05-30 ENCOUNTER — Encounter: Payer: Self-pay | Admitting: Emergency Medicine

## 2020-05-30 ENCOUNTER — Other Ambulatory Visit: Payer: Self-pay

## 2020-05-30 ENCOUNTER — Ambulatory Visit: Payer: Medicare Other | Admitting: Emergency Medicine

## 2020-05-30 DIAGNOSIS — J849 Interstitial pulmonary disease, unspecified: Secondary | ICD-10-CM | POA: Diagnosis not present

## 2020-05-30 DIAGNOSIS — R0602 Shortness of breath: Secondary | ICD-10-CM

## 2020-05-30 MED ORDER — SPIRIVA RESPIMAT 2.5 MCG/ACT IN AERS
2.0000 | INHALATION_SPRAY | Freq: Every day | RESPIRATORY_TRACT | 0 refills | Status: DC
Start: 2020-05-30 — End: 2020-07-03

## 2020-05-30 NOTE — Assessment & Plan Note (Signed)
Suspect at least some component of deconditioning, obesity with restriction based on his pulmonary function testing but he is high risk for obstructive lung disease.  Need to evaluate with CT chest to ensure no evolving ILD while on amiodarone or due to his COVID-19 pneumonia.  He is at high risk for residual interstitial disease from that illness in 10/2019.  We will also perform pulmonary function testing to compare with his priors in 2019.  His cardiology evaluation to date has been reassuring.  I will do an empiric trial of Spiriva Respimat given my suspicion that he has some COPD to see if he gets benefit while we are arranging his testing.  Finally consider pulmonary hypertension, multifactorial.  He may have obstructive sleep apnea and we may decide to perform a sleep study going forward.  We will perform a CT scan of your chest We will repeat your pulmonary function testing to compare with 2019.  We will try starting Spiriva Respimat 2 puffs once a day to see if this helps your breathing We may consider doing a sleep test at some point in the future.  Follow with Dr. Lamonte Sakai next available with full pulmonary function testing on the same day.

## 2020-05-30 NOTE — Addendum Note (Signed)
Addended by: Gavin Potters R on: 05/30/2020 11:15 AM   Modules accepted: Orders

## 2020-05-30 NOTE — Progress Notes (Signed)
 Subjective:    Patient ID: [name], male    DOB: 12/09/1934, 85 y.o.   MRN: [id number]  HPI 85-year-old former smoker (60 pack years) with a history of atrial fibrillation on amiodarone, CAD/CABG, diabetes type 2, hypertension.   He had COVID-19 in December 2020, required hospital admission and was treated with IV steroids, remdesivir.  He was discharged home on room air.  He reports that he has been experiencing exertional SOB for a few years, but this may have been progressive since COVID. He can only walk through the house, then has to rest. He can make it through a store if he paces himself. He does not have much cough, does clear his throat and clears some mucous. No wheeze. Occasionally has had episodes of PND - rare. He snores, occasionally dozes during the day.   Pulmonary function testing 06/22/2018 reviewed by me shows grossly normal airflows without a bronchodilator response, possible mild obstruction based on curve his flow volume loop, normal lung volumes and a decreased diffusion capacity that does not fully correct for his alveolar volume.   Review of Systems As per HPI  Past Medical History:  Diagnosis Date  . Arthritis   . Atrial fibrillation (HCC)    post op; amiodarone and coumadin continued for 3 mos post op  . CARCINOMA, SKIN, SQUAMOUS CELL 01/07/2010  . Cataract    beginning of cataracts  . COLONIC POLYPS, HX OF 04/26/2007  . Coronary artery disease    s/p CABG 3/12: L-LAD, S-CFX (Dr. Owen); EF 45% at cath prior to CABG  . DIABETES MELLITUS, TYPE II 04/26/2007  . Hyperlipidemia   . HYPERTENSION 04/26/2007  . NEPHROLITHIASIS, HX OF 04/26/2007  . SBO (small bowel obstruction) (HCC) 10/2015   Partial   . Shortness of breath dyspnea    with exertion  . Unspecified hearing loss 12/18/2008     Family History  Problem Relation Age of Onset  . Heart failure Mother   . Heart disease Father   . Cancer Sister   . Cancer Brother   . Heart disease Brother   .  Breast cancer Sister   . Colon cancer Neg Hx   . Esophageal cancer Neg Hx   . Rectal cancer Neg Hx   . Stomach cancer Neg Hx      Social History   Socioeconomic History  . Marital status: Married    Spouse name: Not on file  . Number of children: Not on file  . Years of education: Not on file  . Highest education level: Not on file  Occupational History  . Not on file  Tobacco Use  . Smoking status: Former Smoker    Packs/day: 2.00    Years: 30.00    Pack years: 60.00    Types: Cigarettes    Quit date: 01/02/1981    Years since quitting: 39.4  . Smokeless tobacco: Never Used  Vaping Use  . Vaping Use: Never used  Substance and Sexual Activity  . Alcohol use: No    Alcohol/week: 0.0 standard drinks  . Drug use: No  . Sexual activity: Not on file  Other Topics Concern  . Not on file  Social History Narrative  . Not on file   Social Determinants of Health   Financial Resource Strain:   . Difficulty of Paying Living Expenses:   Food Insecurity:   . Worried About Running Out of Food in the Last Year:   . Ran Out of Food in   the Last Year:   Transportation Needs:   . Film/video editor (Medical):   Marland Kitchen Lack of Transportation (Non-Medical):   Physical Activity:   . Days of Exercise per Week:   . Minutes of Exercise per Session:   Stress:   . Feeling of Stress :   Social Connections:   . Frequency of Communication with Friends and Family:   . Frequency of Social Gatherings with Friends and Family:   . Attends Religious Services:   . Active Member of Clubs or Organizations:   . Attends Archivist Meetings:   Marland Kitchen Marital Status:   Intimate Partner Violence:   . Fear of Current or Ex-Partner:   . Emotionally Abused:   Marland Kitchen Physically Abused:   . Sexually Abused:      No Known Allergies   Outpatient Medications Prior to Visit  Medication Sig Dispense Refill  . Accu-Chek Softclix Lancets lancets Use to check blood sugar daily DX:E11.59 100 each 3  .  amiodarone (PACERONE) 200 MG tablet TAKE 1 TABLET BY MOUTH EVERY DAY (Patient taking differently: Take 200 mg by mouth daily. ) 90 tablet 3  . aspirin 81 MG tablet Take 81 mg by mouth daily.     . Blood Glucose Monitoring Suppl (ACCU-CHEK AVIVA PLUS) w/Device KIT Use to check blood sugar daily DX:E11.59 1 kit 0  . cholecalciferol (VITAMIN D) 1000 units tablet Take 1,000 Units by mouth 2 (two) times daily.    Marland Kitchen ELIQUIS 5 MG TABS tablet TAKE 1 TABLET BY MOUTH TWICE A DAY 60 tablet 5  . furosemide (LASIX) 40 MG tablet Take 1 tablet (40 mg total) by mouth as directed. Take 1 tablet every Mon, Wed, Fri 90 tablet 3  . gabapentin (NEURONTIN) 100 MG capsule TAKE 1 CAPSULE (100 MG TOTAL) BY MOUTH 3 (THREE) TIMES DAILY. OFFICE VISIT NEEDED 270 capsule 3  . glucose blood (ACCU-CHEK AVIVA PLUS) test strip Use to check blood sugar daily DX:E11.59 100 each 3  . metFORMIN (GLUCOPHAGE) 500 MG tablet Take 1 tablet (500 mg total) by mouth 3 (three) times daily. 270 tablet 3  . metoprolol succinate (TOPROL-XL) 25 MG 24 hr tablet TAKE 1 TABLET BY MOUTH EVERY DAY (Patient taking differently: Take 25 mg by mouth daily. ) 90 tablet 3  . pantoprazole (PROTONIX) 40 MG tablet TAKE 1 TABLET BY MOUTH EVERY DAY 90 tablet 3  . potassium chloride SA (KLOR-CON) 20 MEQ tablet Take 1 tablet (20 mEq total) by mouth every other day. 30 tablet 3  . Propylene Glycol (SYSTANE BALANCE) 0.6 % SOLN Place 1 drop into both eyes 2 (two) times daily as needed (for dry eyes).    . repaglinide (PRANDIN) 2 MG tablet TAKE 1 TABLET BY MOUTH 3 TIMES DAILY BEFORE MEALS. 270 tablet 1  . simvastatin (ZOCOR) 10 MG tablet TAKE 1 TABLET BY MOUTH EVERY DAY 90 tablet 3   No facility-administered medications prior to visit.        Objective:   Physical Exam  Vitals:   05/30/20 1034  BP: 128/68  Pulse: 69  Temp: 97.9 F (36.6 C)  TempSrc: Oral  SpO2: 98%  Weight: 230 lb 3.2 oz (104.4 kg)  Height: 5' 8" (1.727 m)   Gen: Pleasant, obese man,  well-nourished, in no distress,  normal affect  ENT: No lesions,  mouth clear,  oropharynx clear, no postnasal drip  Neck: No JVD, no stridor  Lungs: No use of accessory muscles, no crackles or wheezing on normal respiration,  no wheeze on forced expiration  Cardiovascular: RRR, heart sounds normal, no murmur or gallops, no peripheral edema  Musculoskeletal: No deformities, no cyanosis or clubbing  Neuro: alert, awake, non focal  Skin: Warm, no lesions or rash      Assessment & Plan:  Dyspnea Suspect at least some component of deconditioning, obesity with restriction based on his pulmonary function testing but he is high risk for obstructive lung disease.  Need to evaluate with CT chest to ensure no evolving ILD while on amiodarone or due to his COVID-19 pneumonia.  He is at high risk for residual interstitial disease from that illness in 10/2019.  We will also perform pulmonary function testing to compare with his priors in 2019.  His cardiology evaluation to date has been reassuring.  I will do an empiric trial of Spiriva Respimat given my suspicion that he has some COPD to see if he gets benefit while we are arranging his testing.  Finally consider pulmonary hypertension, multifactorial.  He may have obstructive sleep apnea and we may decide to perform a sleep study going forward.  We will perform a CT scan of your chest We will repeat your pulmonary function testing to compare with 2019.  We will try starting Spiriva Respimat 2 puffs once a day to see if this helps your breathing We may consider doing a sleep test at some point in the future.  Follow with Dr. Byrum next available with full pulmonary function testing on the same day.   Robert Byrum, MD, PhD 05/30/2020, 11:05 AM [facility] [phone] or if no answer [phone]  

## 2020-05-30 NOTE — Patient Instructions (Addendum)
We will perform a CT scan of your chest We will repeat your pulmonary function testing to compare with 2019.  We will try starting Spiriva Respimat 2 puffs once a day to see if this helps your breathing We may consider doing a sleep test at some point in the future.  Follow with Dr. Lamonte Sakai next available with full pulmonary function testing on the same day.

## 2020-06-06 ENCOUNTER — Other Ambulatory Visit: Payer: Self-pay

## 2020-06-06 ENCOUNTER — Ambulatory Visit (INDEPENDENT_AMBULATORY_CARE_PROVIDER_SITE_OTHER)
Admission: RE | Admit: 2020-06-06 | Discharge: 2020-06-06 | Disposition: A | Discharge status: Home / Self Care | Payer: Medicare Other | Source: Ambulatory Visit | Attending: Emergency Medicine | Admitting: Emergency Medicine

## 2020-06-06 DIAGNOSIS — J849 Interstitial pulmonary disease, unspecified: Secondary | ICD-10-CM

## 2020-06-06 DIAGNOSIS — J479 Bronchiectasis, uncomplicated: Secondary | ICD-10-CM | POA: Diagnosis not present

## 2020-06-06 DIAGNOSIS — I700 Atherosclerosis of aorta: Secondary | ICD-10-CM | POA: Diagnosis not present

## 2020-06-06 DIAGNOSIS — J84112 Idiopathic pulmonary fibrosis: Secondary | ICD-10-CM | POA: Diagnosis not present

## 2020-06-06 DIAGNOSIS — I2510 Atherosclerotic heart disease of native coronary artery without angina pectoris: Secondary | ICD-10-CM | POA: Diagnosis not present

## 2020-06-18 NOTE — Telephone Encounter (Signed)
His CT chest shows some mild interstitial changes or scarring in the dependent areas of both lungs, without any evidence of active inflammation. I suspect that this is left over injury from his bout with COVID. Other kinds of lung inflammation can leave behind scar also. He is on a medication amiodarone that can cause inflammation, although we don't have any real evidence thus far that that medication is a problem for him.   We can / should review the results when able. If he does not want to follow up in office then we can set up a video visit to review all his results and answer questions.

## 2020-06-18 NOTE — Telephone Encounter (Signed)
Dr. Lamonte Sakai, please see mychart message sent by pt's daughter requesting to know the results of recent CT.

## 2020-06-20 NOTE — Telephone Encounter (Signed)
Patient contacted with results of CT scan, appointment with NP made to review results in person.

## 2020-07-01 DIAGNOSIS — H401121 Primary open-angle glaucoma, left eye, mild stage: Secondary | ICD-10-CM | POA: Diagnosis not present

## 2020-07-01 DIAGNOSIS — H40021 Open angle with borderline findings, high risk, right eye: Secondary | ICD-10-CM | POA: Diagnosis not present

## 2020-07-02 ENCOUNTER — Encounter: Payer: Self-pay | Admitting: Primary Care

## 2020-07-02 ENCOUNTER — Ambulatory Visit: Payer: Medicare Other | Admitting: Primary Care

## 2020-07-02 ENCOUNTER — Other Ambulatory Visit: Payer: Self-pay

## 2020-07-02 DIAGNOSIS — R0602 Shortness of breath: Secondary | ICD-10-CM | POA: Diagnosis not present

## 2020-07-02 MED ORDER — STIOLTO RESPIMAT 2.5-2.5 MCG/ACT IN AERS
2.0000 | INHALATION_SPRAY | Freq: Every day | RESPIRATORY_TRACT | 0 refills | Status: AC
Start: 2020-07-02 — End: ?

## 2020-07-02 NOTE — Progress Notes (Signed)
_0  ID: [name], male    DOB: 28-Jun-1935, 85 y.o.   MRN: [id number]  Chief Complaint  Patient presents with  . Follow-up    Referring provider: [name], Evie Lacks, MD  HPI: 85 year old male, former smoker (60+pack). PMH significant for HTN, CAD, afib, acute respiratory disease d/t covid-19, SBO, type 2 DM. Patient of Dr. Lamonte Sakai, seen for initial consult on 05/30/20 for shortness of breath. He was started on Spiriva respimat. Ordered for CT chest, PFTs. May need sleep study in the future.  07/02/2020 Patient presents today for 1 month follow-up/review testing. Reports increased shortness of breath over the last couple of years. Walking short distances causes him to get out of breath. He has been compliant with using Spiriva sample but hasn't noticed any significant improvement. CT showed some mild interstitial changes or scarring in dependent areas of both lungs without evidence of active inflammation. Suspected residual from covid-19 infection. He has been amiodarone for several years.   No Known Allergies  Immunization History  Administered Date(s) Administered  . Fluad Quad(high Dose 65+) 08/18/2019  . Influenza Nasal 08/28/2016  . Influenza Whole 09/14/2007, 09/01/2010  . Influenza, High Dose Seasonal PF 09/06/2017, 08/16/2018  . Influenza,inj,Quad PF,6+ Mos 08/20/2015  . Influenza-Unspecified 08/16/2014  . PFIZER SARS-COV-2 Vaccination 02/23/2020, 03/19/2020  . Pneumococcal Conjugate-13 08/16/2014  . Pneumococcal Polysaccharide-23 10/16/2002, 02/18/2016  . Td 11/16/1993, 12/18/2008  . Zoster 07/24/2011    Past Medical History:  Diagnosis Date  . Arthritis   . Atrial fibrillation ([place])    post op; amiodarone and coumadin continued for 3 mos post op  . CARCINOMA, SKIN, SQUAMOUS CELL 01/07/2010  . Cataract    beginning of cataracts  . COLONIC POLYPS, HX OF 04/26/2007  . Coronary artery disease    s/p CABG 3/12: L-LAD, S-CFX (Dr. Roxy Manns); EF 45% at cath prior  to CABG  . DIABETES MELLITUS, TYPE II 04/26/2007  . Hyperlipidemia   . HYPERTENSION 04/26/2007  . NEPHROLITHIASIS, HX OF 04/26/2007  . SBO (small bowel obstruction) ([place]) 10/2015   Partial   . Shortness of breath dyspnea    with exertion  . Unspecified hearing loss 12/18/2008    Tobacco History: Social History   Tobacco Use  Smoking Status Former Smoker  . Packs/day: 2.00  . Years: 30.00  . Pack years: 60.00  . Types: Cigarettes  . Quit date: 01/02/1981  . Years since quitting: 39.5  Smokeless Tobacco Never Used   Counseling given: Not Answered   Outpatient Medications Prior to Visit  Medication Sig Dispense Refill  . Accu-Chek Softclix Lancets lancets Use to check blood sugar daily DX:E11.59 100 each 3  . amiodarone (PACERONE) 200 MG tablet TAKE 1 TABLET BY MOUTH EVERY DAY (Patient taking differently: Take 200 mg by mouth daily. ) 90 tablet 3  . aspirin 81 MG tablet Take 81 mg by mouth daily.     . Blood Glucose Monitoring Suppl (ACCU-CHEK AVIVA PLUS) w/Device KIT Use to check blood sugar daily DX:E11.59 1 kit 0  . cholecalciferol (VITAMIN D) 1000 units tablet Take 1,000 Units by mouth 2 (two) times daily.    Marland Kitchen ELIQUIS 5 MG TABS tablet TAKE 1 TABLET BY MOUTH TWICE A DAY 60 tablet 5  . furosemide (LASIX) 40 MG tablet Take 1 tablet (40 mg total) by mouth as directed. Take 1 tablet every Mon, Wed, Fri 90 tablet 3  . gabapentin (NEURONTIN) 100 MG capsule TAKE 1 CAPSULE (100 MG TOTAL) BY MOUTH 3 (THREE)  TIMES DAILY. OFFICE VISIT NEEDED 270 capsule 3  . glucose blood (ACCU-CHEK AVIVA PLUS) test strip Use to check blood sugar daily DX:E11.59 100 each 3  . metFORMIN (GLUCOPHAGE) 500 MG tablet Take 1 tablet (500 mg total) by mouth 3 (three) times daily. 270 tablet 3  . metoprolol succinate (TOPROL-XL) 25 MG 24 hr tablet TAKE 1 TABLET BY MOUTH EVERY DAY (Patient taking differently: Take 25 mg by mouth daily. ) 90 tablet 3  . pantoprazole (PROTONIX) 40 MG tablet TAKE 1 TABLET BY MOUTH EVERY  DAY 90 tablet 3  . potassium chloride SA (KLOR-CON) 20 MEQ tablet Take 1 tablet (20 mEq total) by mouth every other day. 30 tablet 3  . Propylene Glycol (SYSTANE BALANCE) 0.6 % SOLN Place 1 drop into both eyes 2 (two) times daily as needed (for dry eyes).    . repaglinide (PRANDIN) 2 MG tablet TAKE 1 TABLET BY MOUTH 3 TIMES DAILY BEFORE MEALS. 270 tablet 1  . simvastatin (ZOCOR) 10 MG tablet TAKE 1 TABLET BY MOUTH EVERY DAY 90 tablet 3  . Tiotropium Bromide Monohydrate (SPIRIVA RESPIMAT) 2.5 MCG/ACT AERS Inhale 2 puffs into the lungs daily. 4 g 0   No facility-administered medications prior to visit.    Review of Systems  Review of Systems  Constitutional: Negative.   Respiratory: Positive for shortness of breath. Negative for cough, chest tightness and wheezing.   Cardiovascular: Negative.     Physical Exam  BP 132/64 (BP Location: Left Arm, Cuff Size: Normal)   Pulse 77   Temp (!) 97.5 F (36.4 C) (Oral)   Ht _0  (1.727 m)   Wt 234 lb 3.2 oz (106.2 kg)   SpO2 97%   BMI 35.61 kg/m  Physical Exam Constitutional:      Appearance: Normal appearance.  HENT:     Head: Normocephalic and atraumatic.     Mouth/Throat:     Mouth: Mucous membranes are moist.     Pharynx: Oropharynx is clear.  Cardiovascular:     Rate and Rhythm: Normal rate.  Pulmonary:     Effort: Pulmonary effort is normal.     Breath sounds: Normal breath sounds. No wheezing or rales.  Abdominal:     General: There is distension.     Palpations: There is no mass.     Tenderness: There is no abdominal tenderness.  Musculoskeletal:     Cervical back: Normal range of motion and neck supple.  Skin:    General: Skin is warm and dry.  Neurological:     General: No focal deficit present.     Mental Status: He is alert and oriented to person, place, and time. Mental status is at baseline.  Psychiatric:        Mood and Affect: Mood normal.        Behavior: Behavior normal.        Thought Content: Thought  content normal.        Judgment: Judgment normal.      Lab Results:  CBC    Component Value Date/Time   WBC 6.6 12/22/2019 1221   WBC 7.7 10/28/2019 0324   RBC 4.35 12/22/2019 1221   RBC 4.17 (L) 10/28/2019 0324   HGB 11.5 (L) 12/22/2019 1221   HCT 35.9 (L) 12/22/2019 1221   PLT 267 12/22/2019 1221   MCV 83 12/22/2019 1221   MCH 26.4 (L) 12/22/2019 1221   MCH 27.6 10/28/2019 0324   MCHC 32.0 12/22/2019 1221   MCHC 32.3 10/28/2019  0324   RDW 14.3 12/22/2019 1221   LYMPHSABS 1.4 10/28/2019 0324   LYMPHSABS 2.7 01/22/2017 1418   MONOABS 1.0 10/28/2019 0324   EOSABS 0.0 10/28/2019 0324   EOSABS 0.2 01/22/2017 1418   BASOSABS 0.0 10/28/2019 0324   BASOSABS 0.0 01/22/2017 1418    BMET    Component Value Date/Time   NA 142 02/05/2020 1435   K 4.7 02/05/2020 1435   CL 105 02/05/2020 1435   CO2 22 02/05/2020 1435   GLUCOSE 147 (H) 02/05/2020 1435   GLUCOSE 156 (H) 10/28/2019 0324   GLUCOSE 120 (H) 11/05/2006 1123   BUN 21 02/05/2020 1435   CREATININE 1.32 (H) 02/05/2020 1435   CALCIUM 9.5 02/05/2020 1435   GFRNONAA 50 (L) 02/05/2020 1435   GFRAA 57 (L) 02/05/2020 1435    BNP    Component Value Date/Time   BNP 185.1 (H) 10/28/2019 0324    ProBNP    Component Value Date/Time   PROBNP 151 02/05/2020 1435   PROBNP 85.0 03/17/2012 1454    Imaging: CT Chest High Resolution  Result Date: 06/06/2020 CLINICAL DATA:  Worsening chronic dyspnea with exertion over several years. EXAM: CT CHEST WITHOUT CONTRAST TECHNIQUE: Multidetector CT imaging of the chest was performed following the standard protocol without intravenous contrast. High resolution imaging of the lungs, as well as inspiratory and expiratory imaging, was performed. COMPARISON:  05/10/2018 chest CT angiogram. 10/24/2019 chest radiograph. FINDINGS: Cardiovascular: Top-normal heart size. No significant pericardial effusion/thickening. Three-vessel coronary atherosclerosis status post CABG. Atherosclerotic  nonaneurysmal thoracic aorta. Normal caliber pulmonary arteries. Mediastinum/Nodes: No discrete thyroid nodules. Unremarkable esophagus. No pathologically enlarged axillary, mediastinal or hilar lymph nodes, noting limited sensitivity for the detection of hilar adenopathy on this noncontrast study. Lungs/Pleura: No pneumothorax. No pleural effusion. No acute consolidative airspace disease, lung masses or significant pulmonary nodules. No significant air trapping or evidence of tracheobronchomalacia on the expiration sequence. There is moderate patchy confluent subpleural reticulation and ground-glass opacity throughout both lungs with associated mild traction bronchiectasis and architectural distortion. Slight basilar predominance to these findings. Possible early honeycombing in the peripheral right middle lobe (series 4/image 214). Comparison to the prior chest CT is limited by hypoventilatory changes on the prior CT. Upper abdomen: No acute abnormality. Musculoskeletal: No aggressive appearing focal osseous lesions. Intact sternotomy wires. Moderate thoracic spondylosis. IMPRESSION: 1. 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 Findings are categorized as probable UIP per consensus guidelines: Diagnosis of Idiopathic Pulmonary Fibrosis: An Official ATS/ERS/JRS/ALAT Clinical Practice Guideline. [place], Iss 5, [phone], Jul 17 2017. 2. Aortic Atherosclerosis (ICD10-I70.0). Electronically Signed   By: Ilona Sorrel M.D.   On: 06/06/2020 17:10     Assessment & Plan:   Dyspnea - Continues to experience dyspnea with minimal exertion. No perceived improvement with LAMA. No overt rales on exam. Plan complete sample of Spiriva then start Stiolto 2 puffs once daily in the morning. For now I think it is ok to continue amiodarone, recommend monitoring diffusion capacity and if decreased may  want to consider coming off in the future. Patient is scheduled for PFT on 07/31/20 and OV with Dr. Lamonte Sakai after      Martyn Ehrich, NP 07/03/2020

## 2020-07-02 NOTE — Patient Instructions (Addendum)
Nice seeing you today  Recommendations: - Finish Spiriva sample; then start Stiolto 2 puffs once daily in the morning  Follow-up: - 9/15 @ 1pm pulmonary testing/ 2pm appointment with Dr. Lamonte Sakai

## 2020-07-03 ENCOUNTER — Encounter: Payer: Self-pay | Admitting: Primary Care

## 2020-07-03 NOTE — Assessment & Plan Note (Addendum)
-   Continues to experience dyspnea with minimal exertion. No perceived improvement with LAMA. No overt rales on exam. Plan complete sample of Spiriva then start Stiolto 2 puffs once daily in the morning. For now I think it is ok to continue amiodarone, recommend monitoring diffusion capacity and if decreased may want to consider coming off in the future. Patient is scheduled for PFT on 07/31/20 and OV with Dr. Lamonte Sakai after

## 2020-07-28 ENCOUNTER — Other Ambulatory Visit: Payer: Self-pay | Admitting: Internal Medicine

## 2020-07-31 ENCOUNTER — Encounter: Payer: Self-pay | Admitting: Emergency Medicine

## 2020-07-31 ENCOUNTER — Ambulatory Visit (INDEPENDENT_AMBULATORY_CARE_PROVIDER_SITE_OTHER): Payer: Medicare Other | Admitting: Emergency Medicine

## 2020-07-31 ENCOUNTER — Other Ambulatory Visit: Payer: Self-pay | Admitting: Internal Medicine

## 2020-07-31 ENCOUNTER — Ambulatory Visit: Payer: Medicare Other | Admitting: Emergency Medicine

## 2020-07-31 ENCOUNTER — Other Ambulatory Visit: Payer: Self-pay

## 2020-07-31 DIAGNOSIS — R0602 Shortness of breath: Secondary | ICD-10-CM | POA: Diagnosis not present

## 2020-07-31 DIAGNOSIS — J849 Interstitial pulmonary disease, unspecified: Secondary | ICD-10-CM | POA: Diagnosis not present

## 2020-07-31 LAB — PULMONARY FUNCTION TEST
DL/VA % pred: 67 %
DL/VA: 2.62 ml/min/mmHg/L
DLCO cor % pred: 51 %
DLCO cor: 11.68 ml/min/mmHg
DLCO unc % pred: 51 %
DLCO unc: 11.68 ml/min/mmHg
FEF 25-75 Post: 2.24 L/sec
FEF 25-75 Pre: 2.22 L/sec
FEF2575-%Change-Post: 1 %
FEF2575-%Pred-Post: 136 %
FEF2575-%Pred-Pre: 135 %
FEV1-%Change-Post: 0 %
FEV1-%Pred-Post: 82 %
FEV1-%Pred-Pre: 83 %
FEV1-Post: 2.07 L
FEV1-Pre: 2.07 L
FEV1FVC-%Change-Post: 3 %
FEV1FVC-%Pred-Pre: 116 %
FEV6-%Change-Post: -3 %
FEV6-%Pred-Post: 73 %
FEV6-%Pred-Pre: 76 %
FEV6-Post: 2.42 L
FEV6-Pre: 2.52 L
FEV6FVC-%Pred-Post: 108 %
FEV6FVC-%Pred-Pre: 108 %
FVC-%Change-Post: -3 %
FVC-%Pred-Post: 67 %
FVC-%Pred-Pre: 70 %
FVC-Post: 2.42 L
FVC-Pre: 2.52 L
Post FEV1/FVC ratio: 86 %
Post FEV6/FVC ratio: 100 %
Pre FEV1/FVC ratio: 82 %
Pre FEV6/FVC Ratio: 100 %
RV % pred: 75 %
RV: 1.97 L
TLC % pred: 70 %
TLC: 4.7 L

## 2020-07-31 MED ORDER — ALBUTEROL SULFATE HFA 108 (90 BASE) MCG/ACT IN AERS
2.0000 | INHALATION_SPRAY | Freq: Four times a day (QID) | RESPIRATORY_TRACT | 2 refills | Status: DC | PRN
Start: 2020-07-31 — End: 2020-10-21

## 2020-07-31 NOTE — Assessment & Plan Note (Signed)
Your CT scan of the chest shows some very mild scarring change that is more prominent than before.  This could be related to your recent COVID-19 pneumonia.  We will follow for any future changes in your breathing, scan, pulmonary function testing.  This will help [place] decide whether we should consider stopping your amiodarone which can sometimes cause lung inflammation.

## 2020-07-31 NOTE — Patient Instructions (Addendum)
We will not restart Stiolto at this time. Please use albuterol 2 puffs up to every 4 hours if needed for shortness of breath, chest tightness, wheezing. Your CT scan of the chest shows some very mild scarring change that is more prominent than before.  This could be related to your recent COVID-19 pneumonia.  We will follow for any future changes in your breathing, scan, pulmonary function testing.  This will help [place] decide whether we should consider stopping your amiodarone which can sometimes cause lung inflammation. Walking oximetry today on room air Follow with Dr Lamonte Sakai in 6 months or sooner if you have any problems

## 2020-07-31 NOTE — Progress Notes (Signed)
Full PFT completed today ? ?

## 2020-07-31 NOTE — Assessment & Plan Note (Signed)
Has not responded to bronchodilators.  Tried Stiolto.  Suspect that restriction is the driving force behind his dyspnea.  He has some ILD but it is mild.  He has a protuberant abdomen, probably some deconditioning that is contributing to restriction. Will rule out occult desaturation.   We will not restart Stiolto at this time. Please use albuterol 2 puffs up to every 4 hours if needed for shortness of breath, chest tightness, wheezing. Walking oximetry today on room air Follow with Dr Lamonte Sakai in 6 months or sooner if you have any problems

## 2020-07-31 NOTE — Progress Notes (Signed)
Subjective:    Patient ID: [name], male    DOB: November 19, 1934, 85 y.o.   MRN: [id number]  HPI 85 year old former smoker (60 pack years) with a history of atrial fibrillation on amiodarone, CAD/CABG, diabetes type 2, hypertension.   He had COVID-19 in December 2020, required hospital admission and was treated with IV steroids, remdesivir.  He was discharged home on room air.  He reports that he has been experiencing exertional SOB for a few years, but this may have been progressive since COVID. He can only walk through the house, then has to rest. He can make it through a store if he paces himself. He does not have much cough, does clear his throat and clears some mucous. No wheeze. Occasionally has had episodes of PND - rare. He snores, occasionally dozes during the day.   Pulmonary function testing 06/22/2018 reviewed by me shows grossly normal airflows without a bronchodilator response, possible mild obstruction based on curve his flow volume loop, normal lung volumes and a decreased diffusion capacity that does not fully correct for his alveolar volume.  ROV 07/31/20 --85 year old former smoker with A. fib on amiodarone, CAD/CABG, diabetes, hypertension.  Also COVID-19 pneumonia in December 2020 (discharged on room air).  Presumed COPD.  He was started on Spiriva in July when I first met him.  Subsequently changed to [facility] 07/02/2020. He isn't sure that either inhaler helped his breathing much. He still has resting and exertional SOB, particularly bothersome when walking or exerting. He does not have albuterol. He has daily cough, prod of white thick. He has a lot of nasal congestion.   CT scan of the chest done 06/06/2020 reviewed by me, shows moderate patchy confluent subpleural reticulation and groundglass with some mild traction bronchiectasis and architectural distortion, possible early honeycomb change.  Probably slight increase compared with some mild interstitial change noted on the  CT scan of the chest from 05/10/2018  Pulmonary function testing done today shows spirometry most consistent with some restriction with superimposed obstruction.  Lung volumes confirm restriction.  His diffusion capacity is decreased and does not fully correct to the normal range when adjusted for his alveolar volume   Review of Systems As per HPI      Objective:   Physical Exam  Vitals:   07/31/20 1357  BP: 120/70  Pulse: 66  Temp: (!) 97.1 F (36.2 C)  TempSrc: Temporal  SpO2: 98%  Weight: 234 lb (106.1 kg)  Height: '5\' 8"'  (1.727 m)   Gen: Pleasant, obese man, well-nourished, in no distress,  normal affect  ENT: No lesions,  mouth clear,  oropharynx clear, no postnasal drip  Neck: No JVD, no stridor  Lungs: No use of accessory muscles, no crackles or wheezing on normal respiration, no wheeze on forced expiration  Cardiovascular: RRR, heart sounds normal, no murmur or gallops, no peripheral edema  Musculoskeletal: No deformities, no cyanosis or clubbing  Neuro: alert, awake, non focal  Skin: Warm, no lesions or rash      Assessment & Plan:  ILD (interstitial lung disease) ([place]) Your CT scan of the chest shows some very mild scarring change that is more prominent than before.  This could be related to your recent COVID-19 pneumonia.  We will follow for any future changes in your breathing, scan, pulmonary function testing.  This will help [place] decide whether we should consider stopping your amiodarone which can sometimes cause lung inflammation.  Dyspnea Has not responded to bronchodilators.  Tried Stiolto.  Suspect that restriction is the driving force behind his dyspnea.  He has some ILD but it is mild.  He has a protuberant abdomen, probably some deconditioning that is contributing to restriction. Will rule out occult desaturation.   We will not restart Stiolto at this time. Please use albuterol 2 puffs up to every 4 hours if needed for shortness of breath, chest  tightness, wheezing. Walking oximetry today on room air Follow with Dr Lamonte Sakai in 6 months or sooner if you have any problems  Baltazar Apo, MD, PhD 07/31/2020, 3:22 PM [facility] [phone] or if no answer [phone]

## 2020-08-04 ENCOUNTER — Other Ambulatory Visit: Payer: Self-pay

## 2020-08-04 ENCOUNTER — Emergency Department (HOSPITAL_COMMUNITY): Payer: Medicare Other

## 2020-08-04 ENCOUNTER — Inpatient Hospital Stay (HOSPITAL_COMMUNITY): Payer: Medicare Other

## 2020-08-04 ENCOUNTER — Encounter (HOSPITAL_COMMUNITY): Payer: Self-pay | Admitting: *Deleted

## 2020-08-04 ENCOUNTER — Inpatient Hospital Stay (HOSPITAL_COMMUNITY)
Admission: EM | Admit: 2020-08-04 | Discharge: 2020-08-07 | DRG: 388 | Disposition: A | Discharge status: Home / Self Care | Payer: Medicare Other | Attending: Internal Medicine | Admitting: Internal Medicine

## 2020-08-04 DIAGNOSIS — Z96642 Presence of left artificial hip joint: Secondary | ICD-10-CM | POA: Diagnosis present

## 2020-08-04 DIAGNOSIS — I509 Heart failure, unspecified: Secondary | ICD-10-CM | POA: Diagnosis not present

## 2020-08-04 DIAGNOSIS — Z87891 Personal history of nicotine dependence: Secondary | ICD-10-CM | POA: Diagnosis not present

## 2020-08-04 DIAGNOSIS — R933 Abnormal findings on diagnostic imaging of other parts of digestive tract: Secondary | ICD-10-CM | POA: Diagnosis not present

## 2020-08-04 DIAGNOSIS — I5031 Acute diastolic (congestive) heart failure: Secondary | ICD-10-CM

## 2020-08-04 DIAGNOSIS — E785 Hyperlipidemia, unspecified: Secondary | ICD-10-CM | POA: Diagnosis present

## 2020-08-04 DIAGNOSIS — I110 Hypertensive heart disease with heart failure: Secondary | ICD-10-CM | POA: Diagnosis not present

## 2020-08-04 DIAGNOSIS — R1033 Periumbilical pain: Secondary | ICD-10-CM | POA: Diagnosis not present

## 2020-08-04 DIAGNOSIS — I480 Paroxysmal atrial fibrillation: Secondary | ICD-10-CM | POA: Diagnosis present

## 2020-08-04 DIAGNOSIS — I700 Atherosclerosis of aorta: Secondary | ICD-10-CM | POA: Diagnosis not present

## 2020-08-04 DIAGNOSIS — E1159 Type 2 diabetes mellitus with other circulatory complications: Secondary | ICD-10-CM | POA: Diagnosis not present

## 2020-08-04 DIAGNOSIS — E119 Type 2 diabetes mellitus without complications: Secondary | ICD-10-CM

## 2020-08-04 DIAGNOSIS — E1140 Type 2 diabetes mellitus with diabetic neuropathy, unspecified: Secondary | ICD-10-CM | POA: Diagnosis not present

## 2020-08-04 DIAGNOSIS — Z8616 Personal history of COVID-19: Secondary | ICD-10-CM | POA: Diagnosis not present

## 2020-08-04 DIAGNOSIS — I10 Essential (primary) hypertension: Secondary | ICD-10-CM

## 2020-08-04 DIAGNOSIS — Z23 Encounter for immunization: Secondary | ICD-10-CM | POA: Diagnosis not present

## 2020-08-04 DIAGNOSIS — I429 Cardiomyopathy, unspecified: Secondary | ICD-10-CM | POA: Diagnosis present

## 2020-08-04 DIAGNOSIS — E1142 Type 2 diabetes mellitus with diabetic polyneuropathy: Secondary | ICD-10-CM

## 2020-08-04 DIAGNOSIS — Z951 Presence of aortocoronary bypass graft: Secondary | ICD-10-CM

## 2020-08-04 DIAGNOSIS — K56609 Unspecified intestinal obstruction, unspecified as to partial versus complete obstruction: Secondary | ICD-10-CM | POA: Diagnosis present

## 2020-08-04 DIAGNOSIS — E669 Obesity, unspecified: Secondary | ICD-10-CM | POA: Diagnosis present

## 2020-08-04 DIAGNOSIS — K6389 Other specified diseases of intestine: Secondary | ICD-10-CM | POA: Diagnosis not present

## 2020-08-04 DIAGNOSIS — Z6834 Body mass index (BMI) 34.0-34.9, adult: Secondary | ICD-10-CM | POA: Diagnosis not present

## 2020-08-04 DIAGNOSIS — I5033 Acute on chronic diastolic (congestive) heart failure: Secondary | ICD-10-CM | POA: Diagnosis present

## 2020-08-04 DIAGNOSIS — Z7982 Long term (current) use of aspirin: Secondary | ICD-10-CM | POA: Diagnosis not present

## 2020-08-04 DIAGNOSIS — J849 Interstitial pulmonary disease, unspecified: Secondary | ICD-10-CM | POA: Diagnosis present

## 2020-08-04 DIAGNOSIS — I5023 Acute on chronic systolic (congestive) heart failure: Secondary | ICD-10-CM | POA: Diagnosis not present

## 2020-08-04 DIAGNOSIS — K5669 Other partial intestinal obstruction: Secondary | ICD-10-CM | POA: Diagnosis not present

## 2020-08-04 DIAGNOSIS — Z4682 Encounter for fitting and adjustment of non-vascular catheter: Secondary | ICD-10-CM | POA: Diagnosis not present

## 2020-08-04 DIAGNOSIS — J9601 Acute respiratory failure with hypoxia: Secondary | ICD-10-CM | POA: Diagnosis present

## 2020-08-04 DIAGNOSIS — K529 Noninfective gastroenteritis and colitis, unspecified: Secondary | ICD-10-CM | POA: Diagnosis not present

## 2020-08-04 DIAGNOSIS — J90 Pleural effusion, not elsewhere classified: Secondary | ICD-10-CM | POA: Diagnosis not present

## 2020-08-04 DIAGNOSIS — K56600 Partial intestinal obstruction, unspecified as to cause: Principal | ICD-10-CM | POA: Diagnosis present

## 2020-08-04 DIAGNOSIS — Z791 Long term (current) use of non-steroidal anti-inflammatories (NSAID): Secondary | ICD-10-CM | POA: Diagnosis not present

## 2020-08-04 DIAGNOSIS — R109 Unspecified abdominal pain: Secondary | ICD-10-CM | POA: Diagnosis present

## 2020-08-04 DIAGNOSIS — Z7951 Long term (current) use of inhaled steroids: Secondary | ICD-10-CM

## 2020-08-04 DIAGNOSIS — I2510 Atherosclerotic heart disease of native coronary artery without angina pectoris: Secondary | ICD-10-CM | POA: Diagnosis present

## 2020-08-04 DIAGNOSIS — R0602 Shortness of breath: Secondary | ICD-10-CM | POA: Diagnosis not present

## 2020-08-04 DIAGNOSIS — Z7901 Long term (current) use of anticoagulants: Secondary | ICD-10-CM

## 2020-08-04 DIAGNOSIS — Z79899 Other long term (current) drug therapy: Secondary | ICD-10-CM

## 2020-08-04 DIAGNOSIS — I4891 Unspecified atrial fibrillation: Secondary | ICD-10-CM | POA: Diagnosis not present

## 2020-08-04 DIAGNOSIS — I774 Celiac artery compression syndrome: Secondary | ICD-10-CM | POA: Diagnosis not present

## 2020-08-04 DIAGNOSIS — K4020 Bilateral inguinal hernia, without obstruction or gangrene, not specified as recurrent: Secondary | ICD-10-CM | POA: Diagnosis not present

## 2020-08-04 DIAGNOSIS — R1084 Generalized abdominal pain: Secondary | ICD-10-CM | POA: Diagnosis not present

## 2020-08-04 DIAGNOSIS — I771 Stricture of artery: Secondary | ICD-10-CM | POA: Diagnosis not present

## 2020-08-04 DIAGNOSIS — Z7984 Long term (current) use of oral hypoglycemic drugs: Secondary | ICD-10-CM

## 2020-08-04 DIAGNOSIS — K3189 Other diseases of stomach and duodenum: Secondary | ICD-10-CM | POA: Diagnosis not present

## 2020-08-04 DIAGNOSIS — N3289 Other specified disorders of bladder: Secondary | ICD-10-CM | POA: Diagnosis not present

## 2020-08-04 HISTORY — DX: Acute diastolic (congestive) heart failure: I50.31

## 2020-08-04 LAB — COMPREHENSIVE METABOLIC PANEL
ALT: 24 U/L (ref 0–44)
AST: 27 U/L (ref 15–41)
Albumin: 4.5 g/dL (ref 3.5–5.0)
Alkaline Phosphatase: 52 U/L (ref 38–126)
Anion gap: 13 (ref 5–15)
BUN: 15 mg/dL (ref 8–23)
CO2: 21 mmol/L — ABNORMAL LOW (ref 22–32)
Calcium: 9.1 mg/dL (ref 8.9–10.3)
Chloride: 103 mmol/L (ref 98–111)
Creatinine, Ser: 0.86 mg/dL (ref 0.61–1.24)
GFR calc Af Amer: >60 mL/min (ref >60)
GFR calc non Af Amer: >60 mL/min (ref >60)
Glucose, Bld: 169 mg/dL — ABNORMAL HIGH (ref 70–99)
Potassium: 3.9 mmol/L (ref 3.5–5.1)
Sodium: 137 mmol/L (ref 135–145)
Total Bilirubin: 1.1 mg/dL (ref 0.3–1.2)
Total Protein: 8.3 g/dL — ABNORMAL HIGH (ref 6.5–8.1)

## 2020-08-04 LAB — URINALYSIS, ROUTINE W REFLEX MICROSCOPIC
Bilirubin Urine: NEGATIVE
Glucose, UA: 50 mg/dL — AB
Hgb urine dipstick: NEGATIVE
Ketones, ur: 20 mg/dL — AB
Leukocytes,Ua: NEGATIVE
Nitrite: NEGATIVE
Protein, ur: NEGATIVE mg/dL
Specific Gravity, Urine: 1.017 (ref 1.005–1.030)
pH: 6 (ref 5.0–8.0)

## 2020-08-04 LAB — CBC WITH DIFFERENTIAL/PLATELET
Abs Immature Granulocytes: 0.02 10*3/uL (ref 0.00–0.07)
Basophils Absolute: 0 10*3/uL (ref 0.0–0.1)
Basophils Relative: 1 %
Eosinophils Absolute: 0 10*3/uL (ref 0.0–0.5)
Eosinophils Relative: 0 %
HCT: 35.6 % — ABNORMAL LOW (ref 39.0–52.0)
Hemoglobin: 11 g/dL — ABNORMAL LOW (ref 13.0–17.0)
Immature Granulocytes: 0 %
Lymphocytes Relative: 12 %
Lymphs Abs: 0.9 10*3/uL (ref 0.7–4.0)
MCH: 25.5 pg — ABNORMAL LOW (ref 26.0–34.0)
MCHC: 30.9 g/dL (ref 30.0–36.0)
MCV: 82.4 fL (ref 80.0–100.0)
Monocytes Absolute: 0.5 10*3/uL (ref 0.1–1.0)
Monocytes Relative: 6 %
Neutro Abs: 6.5 10*3/uL (ref 1.7–7.7)
Neutrophils Relative %: 81 %
Platelets: 283 10*3/uL (ref 150–400)
RBC: 4.32 MIL/uL (ref 4.22–5.81)
RDW: 16.1 % — ABNORMAL HIGH (ref 11.5–15.5)
WBC: 8 10*3/uL (ref 4.0–10.5)
nRBC: 0 % (ref 0.0–0.2)

## 2020-08-04 LAB — BRAIN NATRIURETIC PEPTIDE: B Natriuretic Peptide: 475.7 pg/mL — ABNORMAL HIGH (ref 0.0–100.0)

## 2020-08-04 LAB — LIPASE, BLOOD: Lipase: 28 U/L (ref 11–51)

## 2020-08-04 LAB — TROPONIN I (HIGH SENSITIVITY)
Troponin I (High Sensitivity): 21 ng/L — ABNORMAL HIGH (ref <18)
Troponin I (High Sensitivity): 25 ng/L — ABNORMAL HIGH (ref <18)

## 2020-08-04 LAB — SARS CORONAVIRUS 2 BY RT PCR (HOSPITAL ORDER, PERFORMED IN ~~LOC~~ HOSPITAL LAB): SARS Coronavirus 2: NEGATIVE

## 2020-08-04 MED ORDER — ONDANSETRON HCL 4 MG/2ML IJ SOLN: 4.0000 mg | Freq: Four times a day (QID) | INTRAMUSCULAR | Status: DC | PRN

## 2020-08-04 MED ORDER — LORAZEPAM 2 MG/ML IJ SOLN
1.0000 mg | Freq: Once | INTRAMUSCULAR | Status: AC
  Administered 2020-08-04: 1 mg via INTRAVENOUS
  Filled 2020-08-04: qty 1

## 2020-08-04 MED ORDER — MORPHINE SULFATE (PF) 4 MG/ML IV SOLN
4.0000 mg | Freq: Once | INTRAVENOUS | Status: AC
  Administered 2020-08-04: 4 mg via INTRAVENOUS
  Filled 2020-08-04: qty 1

## 2020-08-04 MED ORDER — ONDANSETRON HCL 4 MG/2ML IJ SOLN
4.0000 mg | Freq: Once | INTRAMUSCULAR | Status: AC
  Administered 2020-08-04: 4 mg via INTRAVENOUS
  Filled 2020-08-04: qty 2

## 2020-08-04 MED ORDER — SODIUM CHLORIDE 0.9% FLUSH
3.0000 mL | Freq: Two times a day (BID) | INTRAVENOUS | Status: DC
  Administered 2020-08-04 – 2020-08-07 (×6): 3 mL via INTRAVENOUS

## 2020-08-04 MED ORDER — ENOXAPARIN SODIUM 40 MG/0.4ML ~~LOC~~ SOLN
40.0000 mg | SUBCUTANEOUS | Status: DC
  Administered 2020-08-04 – 2020-08-06 (×3): 40 mg via SUBCUTANEOUS
  Filled 2020-08-04 (×3): qty 0.4

## 2020-08-04 MED ORDER — FUROSEMIDE 10 MG/ML IJ SOLN
40.0000 mg | Freq: Once | INTRAMUSCULAR | Status: AC
  Administered 2020-08-04: 40 mg via INTRAVENOUS
  Filled 2020-08-04: qty 4

## 2020-08-04 MED ORDER — IOHEXOL 300 MG/ML  SOLN
100.0000 mL | Freq: Once | INTRAMUSCULAR | Status: AC | PRN
  Administered 2020-08-04: 100 mL via INTRAVENOUS

## 2020-08-04 MED ORDER — LIDOCAINE HCL URETHRAL/MUCOSAL 2 % EX GEL
1.0000 "application " | Freq: Once | CUTANEOUS | Status: AC
  Administered 2020-08-04: 1 via TOPICAL
  Filled 2020-08-04: qty 11

## 2020-08-04 MED ORDER — DM-GUAIFENESIN ER 30-600 MG PO TB12
1.0000 | ORAL_TABLET | Freq: Two times a day (BID) | ORAL | Status: DC
  Filled 2020-08-04 (×5): qty 1

## 2020-08-04 MED ORDER — METOPROLOL TARTRATE 5 MG/5ML IV SOLN
5.0000 mg | Freq: Two times a day (BID) | INTRAVENOUS | Status: DC
  Filled 2020-08-04: qty 5

## 2020-08-04 MED ORDER — ONDANSETRON HCL 4 MG PO TABS: 4.0000 mg | ORAL_TABLET | Freq: Four times a day (QID) | ORAL | Status: DC | PRN

## 2020-08-04 NOTE — ED Triage Notes (Signed)
C/O increased SHOB, noticed it has become worse over last couple of months. Also c/o abd pain without N/V started last night. Pt states he had Covid last Dec and has scar tissue in his lungs.

## 2020-08-04 NOTE — ED Provider Notes (Signed)
[facility] DEPT Provider Note   CSN: [id number] Arrival date & time: 08/04/20  [id number]     History Chief Complaint  Patient presents with  . Shortness of Breath  . Abdominal Pain    [name] is a 85 y.o. male with PMHx Diabetes, HTN, HLD, CAD s/p CABG, A fib on Eliquis, and hx of partial SBO who presents to the ED today with complaint of gradual onset, constant, waxing and waning, periumbilical abdominal pain x 12 hours. Pt also complains of nausea. He states that this feels similar to when he had a blockage - pt's last normal bowel movement was yesterday. He is still passing gas. He states that abdominal pain is making him feel more short of breath as well. Pt states he has felt short of breath for a very long time however worsened recently; he saw a pulmonologist recently who told him he has scarring in his lungs from COVID infection he has in Dec 2020. Pt denies any chest pain or worsening leg swelling. No fevers, chills, vomiting, testicular pain/swelling, urinary symptoms, or any other associated symptoms.   Per chart review: Pulmonology visit on 09/15; recent CT scan this summer with findings concerning for mild ILD. PFT with some restriction with superimposed obstruction. Prescribed albuterol inhaler 2 puffs q 4 hours.   The history is provided by the patient and medical records.       Past Medical History:  Diagnosis Date  . Arthritis   . Atrial fibrillation ([place])    post op; amiodarone and coumadin continued for 3 mos post op  . CARCINOMA, SKIN, SQUAMOUS CELL 01/07/2010  . Cataract    beginning of cataracts  . COLONIC POLYPS, HX OF 04/26/2007  . Coronary artery disease    s/p CABG 3/12: L-LAD, S-CFX (Dr. Roxy Manns); EF 45% at cath prior to CABG  . DIABETES MELLITUS, TYPE II 04/26/2007  . Hyperlipidemia   . HYPERTENSION 04/26/2007  . NEPHROLITHIASIS, HX OF 04/26/2007  . SBO (small bowel obstruction) ([place]) 10/2015   Partial   . Shortness  of breath dyspnea    with exertion  . Unspecified hearing loss 12/18/2008    Patient Active Problem List   Diagnosis Date Noted  . ILD (interstitial lung disease) ([place]) 07/31/2020  . Acute respiratory disease due to COVID-19 virus 10/24/2019  . Acute respiratory failure with hypoxia ([place]) 10/24/2019  . Near syncope 04/12/2019  . Dysphagia 02/21/2019  . Dyspnea 02/21/2019  . History of total knee replacement, left 11/02/2018  . Spinal stenosis of lumbar region 10/03/2018  . Diabetic neuropathy ([place]) 07/05/2018  . RUQ abdominal pain 04/04/2018  . Vertigo 04/04/2018  . Well adult exam 07/02/2017  . Abdominal pain 07/02/2017  . Constipation 02/18/2016  . Small bowel obstruction ([place]) 11/06/2015  . Back pain 05/08/2015  . Cough 10/17/2014  . Hyperlipidemia associated with type 2 diabetes mellitus ([place]) 02/09/2011  . CAD (coronary artery disease), native coronary artery 02/09/2011  . Paroxysmal atrial fibrillation ([place]) 01/20/2011  . CARCINOMA, SKIN, SQUAMOUS CELL 01/07/2010  . UNSPECIFIED HEARING LOSS 12/18/2008  . Diabetes mellitus, type 2 ([place]) 04/26/2007  . Hypertension associated with diabetes ([place]) 04/26/2007  . COLONIC POLYPS, HX OF 04/26/2007  . NEPHROLITHIASIS, HX OF 04/26/2007    Past Surgical History:  Procedure Laterality Date  . BACK SURGERY     disectomy  . CARDIOVERSION N/A 06/08/2018   Procedure: CARDIOVERSION;  Surgeon: Buford Dresser, MD;  Location: [facility] ENDOSCOPY;  Service: Cardiovascular;  Laterality: N/A;  .  CARDIOVERSION N/A 07/25/2018   Procedure: CARDIOVERSION;  Surgeon: Fay Records, MD;  Location: [facility] ENDOSCOPY;  Service: Cardiovascular;  Laterality: N/A;  . COLONOSCOPY    . CORONARY ARTERY BYPASS GRAFT  01/12/2011  . LUMBAR LAMINECTOMY/DECOMPRESSION MICRODISCECTOMY N/A 05/08/2015   Procedure: LUMBAR LAMINECTOMY/DECOMPRESSION MICRODISCECTOMY 1 LEVEL Lumbar four five;  Surgeon: Melina Schools, MD;  Location: [facility];  Service: Orthopedics;  Laterality:  N/A;  . TOTAL HIP ARTHROPLASTY Left    left on 04/15/07  . TYMPANIC MEMBRANE REPAIR  1981       Family History  Problem Relation Age of Onset  . Heart failure Mother   . Heart disease Father   . Cancer Sister   . Cancer Brother   . Heart disease Brother   . Breast cancer Sister   . Colon cancer Neg Hx   . Esophageal cancer Neg Hx   . Rectal cancer Neg Hx   . Stomach cancer Neg Hx     Social History   Tobacco Use  . Smoking status: Former Smoker    Packs/day: 2.00    Years: 30.00    Pack years: 60.00    Types: Cigarettes    Quit date: 01/02/1981    Years since quitting: 39.6  . Smokeless tobacco: Never Used  Vaping Use  . Vaping Use: Never used  Substance Use Topics  . Alcohol use: No    Alcohol/week: 0.0 standard drinks  . Drug use: No    Home Medications Prior to Admission medications   Medication Sig Start Date End Date Taking? Authorizing Provider  albuterol (VENTOLIN HFA) 108 (90 Base) MCG/ACT inhaler Inhale 2 puffs into the lungs every 6 (six) hours as needed for wheezing or shortness of breath. 07/31/20  Yes Collene Gobble, MD  amiodarone (PACERONE) 200 MG tablet TAKE 1 TABLET BY MOUTH EVERY DAY Patient taking differently: Take 200 mg by mouth daily.  07/31/20  Yes Fay Records, MD  aspirin 81 MG tablet Take 81 mg by mouth daily.    Yes [provider]  cholecalciferol (VITAMIN D) 1000 units tablet Take 1,000 Units by mouth 2 (two) times daily.   Yes [provider]  ELIQUIS 5 MG TABS tablet TAKE 1 TABLET BY MOUTH TWICE A DAY Patient taking differently: Take 5 mg by mouth 2 (two) times daily.  11/30/19  Yes Fay Records, MD  furosemide (LASIX) 40 MG tablet Take 1 tablet (40 mg total) by mouth as directed. Take 1 tablet every Mon, Wed, Fri 02/06/20  Yes Fay Records, MD  gabapentin (NEURONTIN) 100 MG capsule TAKE 1 CAPSULE (100 MG TOTAL) BY MOUTH 3 (THREE) TIMES DAILY. OFFICE VISIT NEEDED Patient taking differently: Take 100 mg by mouth 3  (three) times daily.  02/19/20  Yes [name], Evie Lacks, MD  latanoprost (XALATAN) 0.005 % ophthalmic solution Place 1 drop into both eyes daily. 05/13/20  Yes [provider]  metFORMIN (GLUCOPHAGE) 500 MG tablet Take 1 tablet (500 mg total) by mouth 3 (three) times daily. 11/26/19  Yes [name], Evie Lacks, MD  metoprolol succinate (TOPROL-XL) 25 MG 24 hr tablet TAKE 1 TABLET BY MOUTH EVERY DAY Patient taking differently: Take 25 mg by mouth daily.  07/31/20  Yes Fay Records, MD  pantoprazole (PROTONIX) 40 MG tablet TAKE 1 TABLET BY MOUTH EVERY DAY Patient taking differently: Take 40 mg by mouth daily.  02/19/20  Yes [name], Evie Lacks, MD  Propylene Glycol (SYSTANE BALANCE) 0.6 % SOLN Place 1 drop into  both eyes 2 (two) times daily as needed (for dry eyes).   Yes [provider]  repaglinide (PRANDIN) 2 MG tablet TAKE 1 TABLET BY MOUTH 3 TIMES DAILY BEFORE MEALS. Patient taking differently: Take 2 mg by mouth 3 (three) times daily before meals.  07/30/20  Yes [name], Evie Lacks, MD  simvastatin (ZOCOR) 10 MG tablet TAKE 1 TABLET BY MOUTH EVERY DAY Patient taking differently: Take 10 mg by mouth daily at 6 PM.  01/18/20  Yes Fay Records, MD  Tiotropium Bromide-Olodaterol (STIOLTO RESPIMAT) 2.5-2.5 MCG/ACT AERS Inhale 2 puffs into the lungs daily. 07/02/20  Yes Martyn Ehrich, NP  Accu-Chek Softclix Lancets lancets Use to check blood sugar daily DX:E11.59 11/01/19   [name], Evie Lacks, MD  Blood Glucose Monitoring Suppl (ACCU-CHEK AVIVA PLUS) w/Device KIT Use to check blood sugar daily DX:E11.59 11/01/19   [name], Evie Lacks, MD  glucose blood (ACCU-CHEK AVIVA PLUS) test strip Use to check blood sugar daily DX:E11.59 11/01/19   [name], Evie Lacks, MD    Allergies    Patient has no known allergies.  Review of Systems   Review of Systems  Constitutional: Negative for chills and fever.  Respiratory: Positive for shortness of breath. Negative for cough.     Cardiovascular: Negative for chest pain and leg swelling.  Gastrointestinal: Positive for abdominal pain and nausea. Negative for constipation, diarrhea and vomiting.  All other systems reviewed and are negative.   Physical Exam Updated Vital Signs BP (!) 185/86 (BP Location: Right Arm)   Pulse 82   Temp 97.8 F (36.6 C)   Resp (!) 22   Ht _0  (1.727 m)   Wt 104.3 kg   SpO2 93%   BMI 34.97 kg/m   Physical Exam Vitals and nursing note reviewed.  Constitutional:      Appearance: He is obese. He is not ill-appearing or diaphoretic.  HENT:     Head: Normocephalic and atraumatic.  Eyes:     Conjunctiva/sclera: Conjunctivae normal.  Cardiovascular:     Rate and Rhythm: Normal rate and regular rhythm.     Pulses: Normal pulses.  Pulmonary:     Effort: Pulmonary effort is normal.     Breath sounds: Normal breath sounds. No decreased breath sounds, wheezing, rhonchi or rales.  Chest:     Comments: Sternotomy scar  Abdominal:     General: Abdomen is protuberant. Bowel sounds are absent.     Tenderness: There is abdominal tenderness in the periumbilical area. There is no right CVA tenderness, left CVA tenderness, guarding or rebound.     Hernia: No hernia is present.  Musculoskeletal:     Cervical back: Neck supple.     Right lower leg: Edema present.     Left lower leg: Edema present.     Comments: Nonpitting edema bilaterally  Skin:    General: Skin is warm and dry.  Neurological:     Mental Status: He is alert.     ED Results / Procedures / Treatments   Labs (all labs ordered are listed, but only abnormal results are displayed) Labs Reviewed  COMPREHENSIVE METABOLIC PANEL - Abnormal; Notable for the following components:      Result Value   CO2 21 (*)    Glucose, Bld 169 (*)    Total Protein 8.3 (*)    All other components within normal limits  CBC WITH DIFFERENTIAL/PLATELET - Abnormal; Notable for the following components:   Hemoglobin 11.0 (*)    HCT 35.6  (*)  MCH 25.5 (*)    RDW 16.1 (*)    All other components within normal limits  URINALYSIS, ROUTINE W REFLEX MICROSCOPIC - Abnormal; Notable for the following components:   Glucose, UA 50 (*)    Ketones, ur 20 (*)    All other components within normal limits  BRAIN NATRIURETIC PEPTIDE - Abnormal; Notable for the following components:   B Natriuretic Peptide 475.7 (*)    All other components within normal limits  TROPONIN I (HIGH SENSITIVITY) - Abnormal; Notable for the following components:   Troponin I (High Sensitivity) 21 (*)    All other components within normal limits  TROPONIN I (HIGH SENSITIVITY) - Abnormal; Notable for the following components:   Troponin I (High Sensitivity) 25 (*)    All other components within normal limits  SARS CORONAVIRUS 2 BY RT PCR [facility] ORDER, [place] LAB)  LIPASE, BLOOD    EKG EKG Interpretation  Date/Time:  Sunday August 04 2020 07:25:09 EDT Ventricular Rate:  80 PR Interval:    QRS Duration: 108 QT Interval:  420 QTC Calculation: 485 R Axis:   3 Text Interpretation: Normal sinus rhythm old anterior infarct Premature ventricular complexes Confirmed by Pattricia Boss [phone]) on 08/04/2020 12:20:09 PM   Radiology DG Chest 2 View  Result Date: 08/04/2020 CLINICAL DATA:  Shortness of breath. EXAM: CHEST - 2 VIEW COMPARISON:  10/24/2019 FINDINGS: Previous median sternotomy and CABG procedure. Stable cardiac enlargement. Small right pleural effusion and diffuse pulmonary vascular congestion identified. No airspace consolidation. IMPRESSION: Suspect mild CHF. Electronically Signed   By: Kerby Moors M.D.   On: 08/04/2020 08:26   CT Abdomen Pelvis W Contrast  Result Date: 08/04/2020 CLINICAL DATA:  Bowel obstruction suspected.  Pain and swelling. EXAM: CT ABDOMEN AND PELVIS WITH CONTRAST TECHNIQUE: Multidetector CT imaging of the abdomen and pelvis was performed using the standard protocol following bolus  administration of intravenous contrast. CONTRAST:  128m OMNIPAQUE IOHEXOL 300 MG/ML  SOLN COMPARISON:  January 29, 2019 FINDINGS: Lower chest: Small bilateral pleural effusions are identified, right greater than left. No nodules or masses. Reticular changes in the periphery of the lung were also seen in March of 2020, likely interstitial lung disease. No evidence of pneumonia. No other abnormalities in the lower chest. Hepatobiliary: No focal liver abnormality is seen. No gallstones, gallbladder wall thickening, or biliary dilatation. Pancreas: Unremarkable. No pancreatic ductal dilatation or surrounding inflammatory changes. Spleen: Normal in size without focal abnormality. Adrenals/Urinary Tract: Adrenal glands are unremarkable. Kidneys are normal, without renal calculi, focal lesion, or hydronephrosis. Bladder is unremarkable. Stomach/Bowel: The stomach is normal. The proximal small bowel is normal. There is a segment of ileum with wall thickening best seen on coronal images 37 through 47 in the right side of the abdomen. There is fecal material within this segmentally thickened loop of bowel. There is a relative transition point associated on coronal image 34. Proximal to the transition point is a mildly prominent loop of small bowel containing fecal material. Small bowel remains mildly prominent over at least 30 cm proximal to the transition point. The colon and appendix are normal. Vascular/Lymphatic: Calcified atherosclerosis is seen in the abdominal aorta and at the origins of the branching vessels. There is dense atherosclerosis at the origin of the SMA in particular. Atherosclerotic change extends into the iliac and femoral vessels. No adenopathy. Reproductive: Prostate is unremarkable. Other: No abdominal wall hernia or abnormality. No abdominopelvic ascites. Musculoskeletal: The patient is status post left hip replacement.  Degenerative changes are seen in the lumbar spine. IMPRESSION: 1. There is a  segment of ileum in the right side of the abdomen which is relatively thick walled as described above. Just proximal to this segmentally thickened loop is at least 30-40 cm of mildly prominent small bowel which contains fecal material and is larger in caliber than the remainder of the small bowel. The segmentally thickened loop of ileum may be secondary to chronic ischemia given the severe atherosclerotic change at the SMA origin. Sequela of inflammatory bowel disease is also possibility. Recommend consultation with gastroenterology. The segmentally thickened loop results in slowed transit and a relatively low-grade obstruction. 2. Atherosclerotic change in the abdominal aorta and branching vessels. 3. Small bilateral pleural effusions. 4. Probable interstitial lung disease in the lung bases. Electronically Signed   By: Dorise Bullion III M.D   On: 08/04/2020 13:56    Procedures Procedures (including critical care time)  Medications Ordered in ED Medications  morphine 4 MG/ML injection 4 mg (4 mg Intravenous Given 08/04/20 1121)  ondansetron (ZOFRAN) injection 4 mg (4 mg Intravenous Given 08/04/20 1118)  furosemide (LASIX) injection 40 mg (40 mg Intravenous Given 08/04/20 1307)  iohexol (OMNIPAQUE) 300 MG/ML solution 100 mL (100 mLs Intravenous Contrast Given 08/04/20 1315)    ED Course  I have reviewed the triage vital signs and the nursing notes.  Pertinent labs & imaging results that were available during my care of the patient were reviewed by me and considered in my medical decision making (see chart for details).    MDM Rules/Calculators/A&P                          85 year old male who presents to the ED today complaining of periumbilical abdominal pain with nausea that began approximately 12 hours ago.  History of partial small bowel obstruction states this feels similar however he is still having regular bowel movements and passing gas.  He states that the abdominal pain is causing his  shortness of breath to seem worse.  He states he was recently told by pulmonologist that he is scarring in his lungs related to Covid infection that he had in December 2020.  On arrival to the ED patient is afebrile, nontachycardic.  Mildly tachypneic with respirations at 22.  O2 sats 93%.  He is able to speak in full sentences without difficulty and appears to be in no acute distress.  His abdomen does seem mildly protuberant today.  This was noted on exam with pulmonologist last week.  He has absent bowel sounds and diffuse periumbilical abdominal tenderness palpation.  He had an EKG and a chest x-ray done while in the waiting room, chest x-ray does show mild congestive heart failure.  Patient states he supposed to take his Lasix 3 times a week however he states he frequently forgets.  He denies any worsening leg swelling compared to normal.  Plan for lab work including abdominal labs CBC, CMP, lipase.  Will add on BNP as well as a troponin.  Patient denies any chest pain however does have a cardiac history which could be contributing to his worsening shortness of breath.  We will plan for pain medication and Zofran.  We will continue to monitor.   CBC without leukocytosis. Hgb stable at 11.0.  CMP with glucose 169, bicarb 21. No gap. No other electrolyte abnormalities. LFTs within normal limits.  Lipase 28.  Initial troponin of 21. Will repeat.  U/A with ketones  and 50 glucose. No other findings to suggest UTI.   Pt became hypoxic while in the ED with O2 sats at 87%. Placed on 2L [place] which he is not typically on. Will add COVID test at this time however I have lower suspicion given he has had COVID infection in the past and is fully immunized.   BNP elevated at 475.7, elevated from baseline. Will provide IV lasix at this time.  Repeat troponin 25.   CT scan with concerns for possible questionable ischemic bowel. Given this will touch base with GI. Pt has seen Dr. Fuller Plan in the past.   Discussed case  with GI Dr. Silverio Decamp; will consult on pt likely tomorrow. Recommends admission and general surgery consult.   Discussed case with General Surgeon Dr. Marlou Starks who will come evaluate patient as well. Agrees with admission.   Pt initially did not want to be admitted to the hospital. He is upset because he has not eaten anything in 24 hours and that he is now peeing a lot due to IV lasix. I attempted to discuss with pt risks of leaving AMA however he initially did not want to hear my concerns. His daughter looked at his mychart and convinced him he needs to stay for further evaluation.   Dr. Neysa Bonito with [facility] agrees to evaluate patient for admission.   This note was prepared using Dragon voice recognition software and may include unintentional dictation errors due to the inherent limitations of voice recognition software.  Final Clinical Impression(s) / ED Diagnoses Final diagnoses:  SBO (small bowel obstruction) (HCC)  Shortness of breath  Acute on chronic congestive heart failure, unspecified heart failure type [facility])    Rx / DC Orders ED Discharge Orders    None       Eustaquio Maize, PA-C 08/04/20 1528    Pattricia Boss, MD 08/12/20 1500

## 2020-08-04 NOTE — Progress Notes (Signed)
Pt arrived to floor in stable condition. Report given to Sheilah Mins at bedside. Family present in room.

## 2020-08-04 NOTE — Progress Notes (Signed)
MD paged and notified that pt is threatening to pull NG tube off and requesting to see MD.. Pt and daughter were educated re NG tube. Pt c/o that it is uncomfortable, irritating. Marland Kitchen

## 2020-08-04 NOTE — Consult Note (Signed)
Reason for Consult:abd pain Referring Physician: Dr. [name] is an 85 y.o. male.  HPI: The patient is an 85 year old white male who presents to the emergency department with some abdominal pain that started yesterday.  He denies any nausea or vomiting.  He had a similar pain to this for 5 years ago which resolved on its own.  He has been having normal bowel movements and passing flatus.  He underwent a CT scan which did show an area of equalization in the small intestine with some dilation of the small bowel proximal to this.  Past Medical History:  Diagnosis Date  . Acute diastolic CHF (congestive heart failure) ([place]) 08/04/2020  . Arthritis   . Atrial fibrillation ([place])    post op; amiodarone and coumadin continued for 3 mos post op  . CARCINOMA, SKIN, SQUAMOUS CELL 01/07/2010  . Cataract    beginning of cataracts  . COLONIC POLYPS, HX OF 04/26/2007  . Coronary artery disease    s/p CABG 3/12: L-LAD, S-CFX (Dr. Roxy [name]); EF 45% at cath prior to CABG  . DIABETES MELLITUS, TYPE II 04/26/2007  . Hyperlipidemia   . HYPERTENSION 04/26/2007  . NEPHROLITHIASIS, HX OF 04/26/2007  . SBO (small bowel obstruction) ([place]) 10/2015   Partial   . Shortness of breath dyspnea    with exertion  . Unspecified hearing loss 12/18/2008    Past Surgical History:  Procedure Laterality Date  . BACK SURGERY     disectomy  . CARDIOVERSION N/A 06/08/2018   Procedure: CARDIOVERSION;  Surgeon: John Dresser, MD;  Location: [facility] ENDOSCOPY;  Service: Cardiovascular;  Laterality: N/A;  . CARDIOVERSION N/A 07/25/2018   Procedure: CARDIOVERSION;  Surgeon: John Records, MD;  Location: [place];  Service: Cardiovascular;  Laterality: N/A;  . COLONOSCOPY    . CORONARY ARTERY BYPASS GRAFT  01/12/2011  . LUMBAR LAMINECTOMY/DECOMPRESSION MICRODISCECTOMY N/A 05/08/2015   Procedure: LUMBAR LAMINECTOMY/DECOMPRESSION MICRODISCECTOMY 1 LEVEL Lumbar four five;  Surgeon: John Schools, MD;  Location: [place];   Service: Orthopedics;  Laterality: N/A;  . TOTAL HIP ARTHROPLASTY Left    left on 04/15/07  . TYMPANIC MEMBRANE REPAIR  1981    Family History  Problem Relation Age of Onset  . Heart failure Mother   . Heart disease Father   . Cancer Sister   . Cancer Brother   . Heart disease Brother   . Breast cancer Sister   . Colon cancer Neg Hx   . Esophageal cancer Neg Hx   . Rectal cancer Neg Hx   . Stomach cancer Neg Hx     Social History:  reports that he quit smoking about 39 years ago. His smoking use included cigarettes. He has a 60.00 pack-year smoking history. He has never used smokeless tobacco. He reports that he does not drink alcohol and does not use drugs.  Allergies: No Known Allergies  Medications: I have reviewed the patient's current medications.  Results for orders placed or performed during the hospital encounter of 08/04/20 (from the past 48 hour(s))  Comprehensive metabolic panel     Status: Abnormal   Collection Time: 08/04/20 11:18 AM  Result Value Ref Range   Sodium 137 135 - 145 mmol/L   Potassium 3.9 3.5 - 5.1 mmol/L   Chloride 103 98 - 111 mmol/L   CO2 21 (L) 22 - 32 mmol/L   Glucose, Bld 169 (H) 70 - 99 mg/dL    Comment: Glucose reference range applies only to samples taken after fasting  for at least 8 hours.   BUN 15 8 - 23 mg/dL   Creatinine, Ser 0.86 0.61 - 1.24 mg/dL   Calcium 9.1 8.9 - 10.3 mg/dL   Total Protein 8.3 (H) 6.5 - 8.1 g/dL   Albumin 4.5 3.5 - 5.0 g/dL   AST 27 15 - 41 U/L   ALT 24 0 - 44 U/L   Alkaline Phosphatase 52 38 - 126 U/L   Total Bilirubin 1.1 0.3 - 1.2 mg/dL   GFR calc non Af Amer >60 >60 mL/min   GFR calc Af Amer >60 >60 mL/min   Anion gap 13 5 - 15    Comment: Performed at [facility], [place] [address], [place], [place] [address]  Lipase, blood     Status: None   Collection Time: 08/04/20 11:18 AM  Result Value Ref Range   Lipase 28 11 - 51 U/L    Comment: Performed at [facility],  [place] [address] [place], [place] [address]  CBC with Differential     Status: Abnormal   Collection Time: 08/04/20 11:18 AM  Result Value Ref Range   WBC 8.0 4.0 - 10.5 K/uL   RBC 4.32 4.22 - 5.81 MIL/uL   Hemoglobin 11.0 (L) 13.0 - 17.0 g/dL   HCT 35.6 (L) 39 - 52 %   MCV 82.4 80.0 - 100.0 fL   MCH 25.5 (L) 26.0 - 34.0 pg   MCHC 30.9 30.0 - 36.0 g/dL   RDW 16.1 (H) 11.5 - 15.5 %   Platelets 283 150 - 400 K/uL   nRBC 0.0 0.0 - 0.2 %   Neutrophils Relative % 81 %   Neutro Abs 6.5 1.7 - 7.7 K/uL   Lymphocytes Relative 12 %   Lymphs Abs 0.9 0.7 - 4.0 K/uL   Monocytes Relative 6 %   Monocytes Absolute 0.5 0 - 1 K/uL   Eosinophils Relative 0 %   Eosinophils Absolute 0.0 0 - 0 K/uL   Basophils Relative 1 %   Basophils Absolute 0.0 0 - 0 K/uL   Immature Granulocytes 0 %   Abs Immature Granulocytes 0.02 0.00 - 0.07 K/uL    Comment: Performed at [facility], [place] [address], [place], [place] [address]  Urinalysis, Routine w reflex microscopic Urine, Clean Catch     Status: Abnormal   Collection Time: 08/04/20 11:18 AM  Result Value Ref Range   Color, Urine YELLOW YELLOW   APPearance CLEAR CLEAR   Specific Gravity, Urine 1.017 1.005 - 1.030   pH 6.0 5.0 - 8.0   Glucose, UA 50 (A) NEGATIVE mg/dL   Hgb urine dipstick NEGATIVE NEGATIVE   Bilirubin Urine NEGATIVE NEGATIVE   Ketones, ur 20 (A) NEGATIVE mg/dL   Protein, ur NEGATIVE NEGATIVE mg/dL   Nitrite NEGATIVE NEGATIVE   Leukocytes,Ua NEGATIVE NEGATIVE    Comment: Performed at [place] [address] [place], [place] [address]  Troponin I (High Sensitivity)     Status: Abnormal   Collection Time: 08/04/20 11:18 AM  Result Value Ref Range   Troponin I (High Sensitivity) 21 (H) <18 ng/L    Comment: (NOTE) Elevated high sensitivity troponin I (hsTnI) values and significant  changes across serial measurements may suggest ACS but many other  chronic and acute conditions are known to  elevate hsTnI results.  Refer to the "Links" section for chest pain algorithms and additional  guidance. Performed at [facility], [address] [place],  [place] [address]   Brain natriuretic peptide     Status: Abnormal   Collection Time: 08/04/20 11:18 AM  Result Value Ref Range   B Natriuretic Peptide 475.7 (H) 0.0 - 100.0 pg/mL    Comment: Performed at [facility], [place] [address], [place], [place] [address]  SARS Coronavirus 2 by RT PCR (hospital order, performed in [facility] hospital lab) Nasopharyngeal Nasopharyngeal Swab     Status: None   Collection Time: 08/04/20 12:51 PM   Specimen: Nasopharyngeal Swab  Result Value Ref Range   SARS Coronavirus 2 NEGATIVE NEGATIVE    Comment: (NOTE) SARS-CoV-2 target nucleic acids are NOT DETECTED.  The SARS-CoV-2 RNA is generally detectable in upper and lower respiratory specimens during the acute phase of infection. The lowest concentration of SARS-CoV-2 viral copies this assay can detect is 250 copies / mL. A negative result does not preclude SARS-CoV-2 infection and should not be used as the sole basis for treatment or other patient management decisions.  A negative result may occur with improper specimen collection / handling, submission of specimen other than nasopharyngeal swab, presence of viral mutation(s) within the areas targeted by this assay, and inadequate number of viral copies (<250 copies / mL). A negative result must be combined with clinical observations, patient history, and epidemiological information.  Fact Sheet for Patients:   StrictlyIdeas.no  Fact Sheet for Healthcare Providers: BankingDealers.co.za  This test is not yet approved or  cleared by the [place] FDA and has been authorized for detection and/or diagnosis of SARS-CoV-2 by FDA under an Emergency Use Authorization (EUA).  This EUA will remain in effect  (meaning this test can be used) for the duration of the COVID-19 declaration under Section 564(b)(1) of the Act, 21 U.S.C. section 360bbb-3(b)(1), unless the authorization is terminated or revoked sooner.  Performed at [facility], [place] [address], [place], [place] [address]   Troponin I (High Sensitivity)     Status: Abnormal   Collection Time: 08/04/20 12:55 PM  Result Value Ref Range   Troponin I (High Sensitivity) 25 (H) <18 ng/L    Comment: (NOTE) Elevated high sensitivity troponin I (hsTnI) values and significant  changes across serial measurements may suggest ACS but many other  chronic and acute conditions are known to elevate hsTnI results.  Refer to the "Links" section for chest pain algorithms and additional  guidance. Performed at [facility], [place] [address], [place], [place] [address]     DG Chest 2 View  Result Date: 08/04/2020 CLINICAL DATA:  Shortness of breath. EXAM: CHEST - 2 VIEW COMPARISON:  10/24/2019 FINDINGS: Previous median sternotomy and CABG procedure. Stable cardiac enlargement. Small right pleural effusion and diffuse pulmonary vascular congestion identified. No airspace consolidation. IMPRESSION: Suspect mild CHF. Electronically Signed   By: Kerby Moors M.D.   On: 08/04/2020 08:26   DG Abdomen 1 View  Result Date: 08/04/2020 CLINICAL DATA:  NG tube placement EXAM: ABDOMEN - 1 VIEW COMPARISON:  None. FINDINGS: Esophagogastric tube is positioned with tip and side-port at the level of the gastropathy or junction. Recommend slight advancement to ensure subdiaphragmatic placement of tip and side port. Gas-filled, although nondistended bowel throughout the included abdomen and pelvis. IMPRESSION: Esophagogastric tube is positioned with tip and side-port at the level of the gastropathy or junction. Recommend slight advancement to ensure subdiaphragmatic placement of tip and side port. Electronically Signed   By: Eddie Candle  M.D.   On: 08/04/2020 16:40  CT Abdomen Pelvis W Contrast  Result Date: 08/04/2020 CLINICAL DATA:  Bowel obstruction suspected.  Pain and swelling. EXAM: CT ABDOMEN AND PELVIS WITH CONTRAST TECHNIQUE: Multidetector CT imaging of the abdomen and pelvis was performed using the standard protocol following bolus administration of intravenous contrast. CONTRAST:  150mL OMNIPAQUE IOHEXOL 300 MG/ML  SOLN COMPARISON:  January 29, 2019 FINDINGS: Lower chest: Small bilateral pleural effusions are identified, right greater than left. No nodules or masses. Reticular changes in the periphery of the lung were also seen in March of 2020, likely interstitial lung disease. No evidence of pneumonia. No other abnormalities in the lower chest. Hepatobiliary: No focal liver abnormality is seen. No gallstones, gallbladder wall thickening, or biliary dilatation. Pancreas: Unremarkable. No pancreatic ductal dilatation or surrounding inflammatory changes. Spleen: Normal in size without focal abnormality. Adrenals/Urinary Tract: Adrenal glands are unremarkable. Kidneys are normal, without renal calculi, focal lesion, or hydronephrosis. Bladder is unremarkable. Stomach/Bowel: The stomach is normal. The proximal small bowel is normal. There is a segment of ileum with wall thickening best seen on coronal images 37 through 47 in the right side of the abdomen. There is fecal material within this segmentally thickened loop of bowel. There is a relative transition point associated on coronal image 34. Proximal to the transition point is a mildly prominent loop of small bowel containing fecal material. Small bowel remains mildly prominent over at least 30 cm proximal to the transition point. The colon and appendix are normal. Vascular/Lymphatic: Calcified atherosclerosis is seen in the abdominal aorta and at the origins of the branching vessels. There is dense atherosclerosis at the origin of the SMA in particular. Atherosclerotic change extends  into the iliac and femoral vessels. No adenopathy. Reproductive: Prostate is unremarkable. Other: No abdominal wall hernia or abnormality. No abdominopelvic ascites. Musculoskeletal: The patient is status post left hip replacement. Degenerative changes are seen in the lumbar spine. IMPRESSION: 1. There is a segment of ileum in the right side of the abdomen which is relatively thick walled as described above. Just proximal to this segmentally thickened loop is at least 30-40 cm of mildly prominent small bowel which contains fecal material and is larger in caliber than the remainder of the small bowel. The segmentally thickened loop of ileum may be secondary to chronic ischemia given the severe atherosclerotic change at the SMA origin. Sequela of inflammatory bowel disease is also possibility. Recommend consultation with gastroenterology. The segmentally thickened loop results in slowed transit and a relatively low-grade obstruction. 2. Atherosclerotic change in the abdominal aorta and branching vessels. 3. Small bilateral pleural effusions. 4. Probable interstitial lung disease in the lung bases. Electronically Signed   By: Dorise Bullion [name] M.D   On: 08/04/2020 13:56    Review of Systems  Constitutional: Negative.   HENT: Negative.   Eyes: Negative.   Respiratory: Negative.   Cardiovascular: Negative.   Gastrointestinal: Positive for abdominal pain. Negative for nausea and vomiting.  Endocrine: Negative.   Genitourinary: Negative.   Musculoskeletal: Negative.   Skin: Negative.   Allergic/Immunologic: Negative.   Neurological: Negative.   Hematological: Negative.   Psychiatric/Behavioral: Positive for agitation.   Blood pressure (!) 154/78, pulse 81, temperature 97.8 F (36.6 C), resp. rate 17, height 5\' 8"  (1.727 m), weight 104.3 kg, SpO2 92 %. Physical Exam Constitutional:      Appearance: Normal appearance. He is obese. He is not ill-appearing.  HENT:     Head: Normocephalic and  atraumatic.     Right Ear: External ear  normal.     Left Ear: External ear normal.     Nose: Nose normal.     Mouth/Throat:     Mouth: Mucous membranes are moist.  Eyes:     General: No scleral icterus.    Extraocular Movements: Extraocular movements intact.     Conjunctiva/sclera: Conjunctivae normal.     Pupils: Pupils are equal, round, and reactive to light.  Cardiovascular:     Rate and Rhythm: Normal rate and regular rhythm.     Pulses: Normal pulses.     Heart sounds: Normal heart sounds.     Comments: No pitting edema lower extr Pulmonary:     Effort: Pulmonary effort is normal. No respiratory distress.     Breath sounds: Normal breath sounds.  Abdominal:     Palpations: Abdomen is soft.     Tenderness: There is no abdominal tenderness.     Hernia: No hernia is present.  Musculoskeletal:        General: No tenderness or deformity. Normal range of motion.     Cervical back: Normal range of motion and neck supple.  Skin:    General: Skin is warm and dry.  Neurological:     General: No focal deficit present.     Mental Status: He is alert and oriented to person, place, and time.  Psychiatric:        Mood and Affect: Mood normal.        Behavior: Behavior normal.     Assessment/Plan: The patient appears to have some fecalization or thickening of a segment of small intestine with some evidence of partial obstruction above this.  Although his white count is normal I believe we could consider treating the thickened segment of small intestine with antibiotics in case this is an enteritis.  He could start on the small bowel protocol which might better define the thickened loop of small intestine.  This may also be therapeutic and help to flush out the fecalization in the small intestine.  We will follow him closely with you.  [name] 08/04/2020, 6:13 PM

## 2020-08-04 NOTE — Progress Notes (Signed)
Pt and pt's daughter  insisting of removing NG tube. Discussed with Jeannette Corpus, NP the situation. NP ordered to keep NG tube, but if family and patient wants to remove it,  it will be against Medical advice. Daughter and patient agree and would like to proceed with removing NG tube. They were educated . Charge nurse is aware.

## 2020-08-04 NOTE — ED Notes (Signed)
ED TO INPATIENT HANDOFF REPORT  ED Nurse Name and Phone #: [phone]  S Name/Age/Gender [name] 85 y.o. male Room/Bed: WA19/WA19  Code Status   Code Status: Prior  Home/SNF/Other Home Patient oriented to: self, place, time and situation Is this baseline? Yes   Triage Complete: Triage complete  Chief Complaint SBO (small bowel obstruction) ([place]) [K56.609]  Triage Note C/O increased SHOB, noticed it has become worse over last couple of months. Also c/o abd pain without N/V started last night. Pt states he had Covid last Dec and has scar tissue in his lungs.    Allergies No Known Allergies  Level of Care/Admitting Diagnosis ED Disposition    ED Disposition Condition Comment   Admit  Hospital Area: [facility] [[id number]]  Level of Care: Telemetry [5]  Admit to tele based on following criteria: Complex arrhythmia (Bradycardia/Tachycardia)  May admit patient to [facility] or [facility] if equivalent level of care is available:: Yes  Covid Evaluation: Asymptomatic Screening Protocol (No Symptoms)  Diagnosis: SBO (small bowel obstruction) [facility]) [[id number]]  Admitting Physician: Harold Hedge [[id number]]  Attending Physician: Harold Hedge [[id number]]  Estimated length of stay: past midnight tomorrow  Certification:: I certify this patient will need inpatient services for at least 2 midnights       B Medical/Surgery History Past Medical History:  Diagnosis Date   Acute diastolic CHF (congestive heart failure) ([place]) 08/04/2020   Arthritis    Atrial fibrillation ([place])    post op; amiodarone and coumadin continued for 3 mos post op   CARCINOMA, SKIN, SQUAMOUS CELL 01/07/2010   Cataract    beginning of cataracts   COLONIC POLYPS, HX OF 04/26/2007   Coronary artery disease    s/p CABG 3/12: L-LAD, S-CFX (Dr. Roxy Manns); EF 45% at cath prior to CABG   DIABETES MELLITUS, TYPE II 04/26/2007   Hyperlipidemia    HYPERTENSION 04/26/2007    NEPHROLITHIASIS, HX OF 04/26/2007   SBO (small bowel obstruction) ([place]) 10/2015   Partial    Shortness of breath dyspnea    with exertion   Unspecified hearing loss 12/18/2008   Past Surgical History:  Procedure Laterality Date   BACK SURGERY     disectomy   CARDIOVERSION N/A 06/08/2018   Procedure: CARDIOVERSION;  Surgeon: Buford Dresser, MD;  Location: [facility] ENDOSCOPY;  Service: Cardiovascular;  Laterality: N/A;   CARDIOVERSION N/A 07/25/2018   Procedure: CARDIOVERSION;  Surgeon: Fay Records, MD;  Location: [facility];  Service: Cardiovascular;  Laterality: N/A;   COLONOSCOPY     CORONARY ARTERY BYPASS GRAFT  01/12/2011   LUMBAR LAMINECTOMY/DECOMPRESSION MICRODISCECTOMY N/A 05/08/2015   Procedure: LUMBAR LAMINECTOMY/DECOMPRESSION MICRODISCECTOMY 1 LEVEL Lumbar four five;  Surgeon: Melina Schools, MD;  Location: [facility];  Service: Orthopedics;  Laterality: N/A;   TOTAL HIP ARTHROPLASTY Left    left on 04/15/07   [place]     A IV Location/Drains/Wounds Patient Lines/Drains/Airways Status    Active Line/Drains/Airways    Name Placement date Placement time Site Days   Peripheral IV 08/04/20 Left Antecubital 08/04/20  1119  Antecubital  less than 1   NG/OG Tube Nasogastric 18 Fr. Right nare Xray;Aucultation Measured external length of tube 08/04/20  1545  Right nare  less than 1   Incision (Closed) 05/08/15 Back Other (Comment) 05/08/15  1419   1915          Intake/Output Last 24 hours No intake or output data in the 24 hours ending  08/04/20 1827  Labs/Imaging Results for orders placed or performed during the hospital encounter of 08/04/20 (from the past 48 hour(s))  Comprehensive metabolic panel     Status: Abnormal   Collection Time: 08/04/20 11:18 AM  Result Value Ref Range   Sodium 137 135 - 145 mmol/L   Potassium 3.9 3.5 - 5.1 mmol/L   Chloride 103 98 - 111 mmol/L   CO2 21 (L) 22 - 32 mmol/L   Glucose, Bld 169 (H) 70 - 99 mg/dL     Comment: Glucose reference range applies only to samples taken after fasting for at least 8 hours.   BUN 15 8 - 23 mg/dL   Creatinine, Ser 0.86 0.61 - 1.24 mg/dL   Calcium 9.1 8.9 - 10.3 mg/dL   Total Protein 8.3 (H) 6.5 - 8.1 g/dL   Albumin 4.5 3.5 - 5.0 g/dL   AST 27 15 - 41 U/L   ALT 24 0 - 44 U/L   Alkaline Phosphatase 52 38 - 126 U/L   Total Bilirubin 1.1 0.3 - 1.2 mg/dL   GFR calc non Af Amer >60 >60 mL/min   GFR calc Af Amer >60 >60 mL/min   Anion gap 13 5 - 15    Comment: Performed at [facility], [place] [address] [place], [place] [address]  Lipase, blood     Status: None   Collection Time: 08/04/20 11:18 AM  Result Value Ref Range   Lipase 28 11 - 51 U/L    Comment: Performed at [facility], [place] [address] [place], [place] [address]  CBC with Differential     Status: Abnormal   Collection Time: 08/04/20 11:18 AM  Result Value Ref Range   WBC 8.0 4.0 - 10.5 K/uL   RBC 4.32 4.22 - 5.81 MIL/uL   Hemoglobin 11.0 (L) 13.0 - 17.0 g/dL   HCT 35.6 (L) 39 - 52 %   MCV 82.4 80.0 - 100.0 fL   MCH 25.5 (L) 26.0 - 34.0 pg   MCHC 30.9 30.0 - 36.0 g/dL   RDW 16.1 (H) 11.5 - 15.5 %   Platelets 283 150 - 400 K/uL   nRBC 0.0 0.0 - 0.2 %   Neutrophils Relative % 81 %   Neutro Abs 6.5 1.7 - 7.7 K/uL   Lymphocytes Relative 12 %   Lymphs Abs 0.9 0.7 - 4.0 K/uL   Monocytes Relative 6 %   Monocytes Absolute 0.5 0 - 1 K/uL   Eosinophils Relative 0 %   Eosinophils Absolute 0.0 0 - 0 K/uL   Basophils Relative 1 %   Basophils Absolute 0.0 0 - 0 K/uL   Immature Granulocytes 0 %   Abs Immature Granulocytes 0.02 0.00 - 0.07 K/uL    Comment: Performed at [facility], [place] [address], [place],  [address]  Urinalysis, Routine w reflex microscopic Urine, Clean Catch     Status: Abnormal   Collection Time: 08/04/20 11:18 AM  Result Value Ref Range   Color, Urine YELLOW YELLOW   APPearance CLEAR CLEAR   Specific Gravity, Urine  1.017 1.005 - 1.030   pH 6.0 5.0 - 8.0   Glucose, UA 50 (A) NEGATIVE mg/dL   Hgb urine dipstick NEGATIVE NEGATIVE   Bilirubin Urine NEGATIVE NEGATIVE   Ketones, ur 20 (A) NEGATIVE mg/dL   Protein, ur NEGATIVE NEGATIVE mg/dL   Nitrite NEGATIVE NEGATIVE   Leukocytes,Ua NEGATIVE NEGATIVE    Comment: Performed at [facility], [address] [place], [place] [address]  Troponin I (High Sensitivity)     Status: Abnormal   Collection Time: 08/04/20 11:18 AM  Result Value Ref Range   Troponin I (High Sensitivity) 21 (H) <18 ng/L    Comment: (NOTE) Elevated high sensitivity troponin I (hsTnI) values and significant  changes across serial measurements may suggest ACS but many other  chronic and acute conditions are known to elevate hsTnI results.  Refer to the "Links" section for chest pain algorithms and additional  guidance. Performed at [facility], [place] [address] [place], [place] [address]   Brain natriuretic peptide     Status: Abnormal   Collection Time: 08/04/20 11:18 AM  Result Value Ref Range   B Natriuretic Peptide 475.7 (H) 0.0 - 100.0 pg/mL    Comment: Performed at [facility], [place] [address], [place], [place] [address]  SARS Coronavirus 2 by RT PCR (hospital order, performed in [facility] hospital lab) Nasopharyngeal Nasopharyngeal Swab     Status: None   Collection Time: 08/04/20 12:51 PM   Specimen: Nasopharyngeal Swab  Result Value Ref Range   SARS Coronavirus 2 NEGATIVE NEGATIVE    Comment: (NOTE) SARS-CoV-2 target nucleic acids are NOT DETECTED.  The SARS-CoV-2 RNA is generally detectable in upper and lower respiratory specimens during the acute phase of infection. The lowest concentration of SARS-CoV-2 viral copies this assay can detect is 250 copies / mL. A negative result does not preclude SARS-CoV-2 infection and should not be used as the sole basis for treatment or other patient management decisions.   A negative result may occur with improper specimen collection / handling, submission of specimen other than nasopharyngeal swab, presence of viral mutation(s) within the areas targeted by this assay, and inadequate number of viral copies (<250 copies / mL). A negative result must be combined with clinical observations, patient history, and epidemiological information.  Fact Sheet for Patients:   StrictlyIdeas.no  Fact Sheet for Healthcare Providers: BankingDealers.co.za  This test is not yet approved or  cleared by the [place] FDA and has been authorized for detection and/or diagnosis of SARS-CoV-2 by FDA under an Emergency Use Authorization (EUA).  This EUA will remain in effect (meaning this test can be used) for the duration of the COVID-19 declaration under Section 564(b)(1) of the Act, 21 U.S.C. section 360bbb-3(b)(1), unless the authorization is terminated or revoked sooner.  Performed at [facility], [place] [address] [place], [place] [address]   Troponin I (High Sensitivity)     Status: Abnormal   Collection Time: 08/04/20 12:55 PM  Result Value Ref Range   Troponin I (High Sensitivity) 25 (H) <18 ng/L    Comment: (NOTE) Elevated high sensitivity troponin I (hsTnI) values and significant  changes across serial measurements may suggest ACS but many other  chronic and acute conditions are known to elevate hsTnI results.  Refer to the "Links" section for chest pain algorithms and additional  guidance. Performed at [facility], [place] [address], [place], [place] [address]    DG Chest 2 View  Result Date: 08/04/2020 CLINICAL DATA:  Shortness of breath. EXAM: CHEST - 2 VIEW COMPARISON:  10/24/2019 FINDINGS: Previous median sternotomy and CABG procedure. Stable cardiac enlargement. Small right pleural effusion and diffuse pulmonary vascular congestion identified. No airspace consolidation.  IMPRESSION: Suspect mild CHF. Electronically Signed   By: Kerby Moors M.D.   On: 08/04/2020 08:26   DG Abdomen 1 View  Result Date:  08/04/2020 CLINICAL DATA:  NG tube placement EXAM: ABDOMEN - 1 VIEW COMPARISON:  None. FINDINGS: Esophagogastric tube is positioned with tip and side-port at the level of the gastropathy or junction. Recommend slight advancement to ensure subdiaphragmatic placement of tip and side port. Gas-filled, although nondistended bowel throughout the included abdomen and pelvis. IMPRESSION: Esophagogastric tube is positioned with tip and side-port at the level of the gastropathy or junction. Recommend slight advancement to ensure subdiaphragmatic placement of tip and side port. Electronically Signed   By: Eddie Candle M.D.   On: 08/04/2020 16:40   CT Abdomen Pelvis W Contrast  Result Date: 08/04/2020 CLINICAL DATA:  Bowel obstruction suspected.  Pain and swelling. EXAM: CT ABDOMEN AND PELVIS WITH CONTRAST TECHNIQUE: Multidetector CT imaging of the abdomen and pelvis was performed using the standard protocol following bolus administration of intravenous contrast. CONTRAST:  186mL OMNIPAQUE IOHEXOL 300 MG/ML  SOLN COMPARISON:  January 29, 2019 FINDINGS: Lower chest: Small bilateral pleural effusions are identified, right greater than left. No nodules or masses. Reticular changes in the periphery of the lung were also seen in March of 2020, likely interstitial lung disease. No evidence of pneumonia. No other abnormalities in the lower chest. Hepatobiliary: No focal liver abnormality is seen. No gallstones, gallbladder wall thickening, or biliary dilatation. Pancreas: Unremarkable. No pancreatic ductal dilatation or surrounding inflammatory changes. Spleen: Normal in size without focal abnormality. Adrenals/Urinary Tract: Adrenal glands are unremarkable. Kidneys are normal, without renal calculi, focal lesion, or hydronephrosis. Bladder is unremarkable. Stomach/Bowel: The stomach is normal.  The proximal small bowel is normal. There is a segment of ileum with wall thickening best seen on coronal images 37 through 47 in the right side of the abdomen. There is fecal material within this segmentally thickened loop of bowel. There is a relative transition point associated on coronal image 34. Proximal to the transition point is a mildly prominent loop of small bowel containing fecal material. Small bowel remains mildly prominent over at least 30 cm proximal to the transition point. The colon and appendix are normal. Vascular/Lymphatic: Calcified atherosclerosis is seen in the abdominal aorta and at the origins of the branching vessels. There is dense atherosclerosis at the origin of the SMA in particular. Atherosclerotic change extends into the iliac and femoral vessels. No adenopathy. Reproductive: Prostate is unremarkable. Other: No abdominal wall hernia or abnormality. No abdominopelvic ascites. Musculoskeletal: The patient is status post left hip replacement. Degenerative changes are seen in the lumbar spine. IMPRESSION: 1. There is a segment of ileum in the right side of the abdomen which is relatively thick walled as described above. Just proximal to this segmentally thickened loop is at least 30-40 cm of mildly prominent small bowel which contains fecal material and is larger in caliber than the remainder of the small bowel. The segmentally thickened loop of ileum may be secondary to chronic ischemia given the severe atherosclerotic change at the SMA origin. Sequela of inflammatory bowel disease is also possibility. Recommend consultation with gastroenterology. The segmentally thickened loop results in slowed transit and a relatively low-grade obstruction. 2. Atherosclerotic change in the abdominal aorta and branching vessels. 3. Small bilateral pleural effusions. 4. Probable interstitial lung disease in the lung bases. Electronically Signed   By: Dorise Bullion III M.D   On: 08/04/2020 13:56     Pending Labs Unresulted Labs (From admission, onward)          Start     Ordered   08/05/20 4696  Basic metabolic panel  Daily,   R      08/04/20 1811   Signed and Held  CBC  (enoxaparin (LOVENOX)    CrCl >/= 30 ml/min)  Once,   R       Comments: Baseline for enoxaparin therapy IF NOT ALREADY DRAWN.  Notify MD if PLT < 100 K.    Signed and Held   Signed and Held  Creatinine, serum  (enoxaparin (LOVENOX)    CrCl >/= 30 ml/min)  Once,   R       Comments: Baseline for enoxaparin therapy IF NOT ALREADY DRAWN.    Signed and Held   Signed and Held  Creatinine, serum  (enoxaparin (LOVENOX)    CrCl >/= 30 ml/min)  Weekly,   R     Comments: while on enoxaparin therapy    Signed and Held   Signed and Held  Comprehensive metabolic panel  Tomorrow morning,   R        Signed and Held   Signed and Held  CBC  Tomorrow morning,   R        Signed and Held          Vitals/Pain Today's Vitals   08/04/20 1435 08/04/20 1435 08/04/20 1720 08/04/20 1824  BP: (!) 173/87  (!) 154/78 (!) 161/83  Pulse: 79  81 86  Resp: 20  17 19   Temp:      SpO2: 94%  92% 91%  Weight:      Height:      PainSc:  3       Isolation Precautions No active isolations  Medications Medications  morphine 4 MG/ML injection 4 mg (4 mg Intravenous Given 08/04/20 1121)  ondansetron (ZOFRAN) injection 4 mg (4 mg Intravenous Given 08/04/20 1118)  furosemide (LASIX) injection 40 mg (40 mg Intravenous Given 08/04/20 1307)  iohexol (OMNIPAQUE) 300 MG/ML solution 100 mL (100 mLs Intravenous Contrast Given 08/04/20 1315)  lidocaine (XYLOCAINE) 2 % jelly 1 application (1 application Topical Given 08/04/20 1609)    Mobility walks Low fall risk   Focused Assessments .   R Recommendations: See Admitting Provider Note  Report given to:   Additional Notes: n/a

## 2020-08-04 NOTE — H&P (Signed)
History and Physical        Hospital Admission Note Date: 08/04/2020  Patient name: [name] Medical record number: [id number] Date of birth: [birth year] Age: 85 y.o. Gender: male  PCP: [name], Evie Lacks, MD  Patient coming from: home Lives with: wife  Chief Complaint    Chief Complaint  Patient presents with  . Shortness of Breath  . Abdominal Pain      HPI:   This is an 85 year old male with past medical history of diabetes, hypertension, hyperlipidemia, CAD s/p CABG, atrial fibrillation on Eliquis, partial SBO, ILD, COVID-19 (December 2020) HFpEF (last EF 40 to 45% in 2019) who presented to the ED with complaints of gradual onset, constant waxing and waning periumbilical abdominal pain x12 hours with associated nausea similar to prior SBO.  Reportedly had a normal BM yesterday.  Still passing gas feels as though the abdominal pain is making him short of breath.  He is supposed to take Lasix 3 times a week however frequently forgets.  Of note he had COVID-19 in December 2020 and has been having shortness of breath since that time however has been worsening recently and was sent to a pulmonologist.  Had a CT chest on 7/22 which showed fibrotic ILD with slight basilar predominance categorized as probable UIP.  Had a pulmonology visit with Dr. Lamonte Sakai on 9/15 who felt this was probably from his COVID-19 and had PFTs at the time which showed some restriction with superimposed obstruction and was prescribed albuterol.  ED Course: Afebrile, hypertensive and tachypneic with SPO2 87% on room air requiring 2 L.  Labs: CO2 21, glucose 169, BNP 475, troponin 21-> 25, WBC 8.0, Hb 11.0, UA with ketones.  CXR with findings suggestive of mild CHF.  CT abdomen pelvis: Findings suggestive of chronic ischemia VS.  Sequelae of inflammatory bowel disease and relatively low-grade obstruction.   ED PA discussed with Dr. Silverio Decamp, GI will see in a.m. and Dr. Marlou Starks, general surgery, who will evaluate the patient.  Vitals:   08/04/20 1435 08/04/20 1720  BP: (!) 173/87 (!) 154/78  Pulse: 79 81  Resp: 20 17  Temp:    SpO2: 94% 92%     Review of Systems:  Review of Systems  Constitutional: Negative for chills and fever.  Respiratory: Positive for shortness of breath. Negative for wheezing.   Cardiovascular: Negative for chest pain and palpitations.  Gastrointestinal: Positive for abdominal pain. Negative for diarrhea, nausea and vomiting.  Genitourinary: Negative.   Musculoskeletal: Negative.   All other systems reviewed and are negative.   Medical/Social/Family History   Past Medical History: Past Medical History:  Diagnosis Date  . Acute diastolic CHF (congestive heart failure) ([facility]) 08/04/2020  . Arthritis   . Atrial fibrillation ([place])    post op; amiodarone and coumadin continued for 3 mos post op  . CARCINOMA, SKIN, SQUAMOUS CELL 01/07/2010  . Cataract    beginning of cataracts  . COLONIC POLYPS, HX OF 04/26/2007  . Coronary artery disease    s/p CABG 3/12: L-LAD, S-CFX (Dr. Roxy Manns); EF 45% at cath prior to CABG  . DIABETES MELLITUS, TYPE II 04/26/2007  . Hyperlipidemia   . HYPERTENSION 04/26/2007  . NEPHROLITHIASIS, HX OF  04/26/2007  . SBO (small bowel obstruction) ([place]) 10/2015   Partial   . Shortness of breath dyspnea    with exertion  . Unspecified hearing loss 12/18/2008    Past Surgical History:  Procedure Laterality Date  . BACK SURGERY     disectomy  . CARDIOVERSION N/A 06/08/2018   Procedure: CARDIOVERSION;  Surgeon: Buford Dresser, MD;  Location: [facility] ENDOSCOPY;  Service: Cardiovascular;  Laterality: N/A;  . CARDIOVERSION N/A 07/25/2018   Procedure: CARDIOVERSION;  Surgeon: Fay Records, MD;  Location: [place];  Service: Cardiovascular;  Laterality: N/A;  . COLONOSCOPY    . CORONARY ARTERY BYPASS GRAFT  01/12/2011  . LUMBAR  LAMINECTOMY/DECOMPRESSION MICRODISCECTOMY N/A 05/08/2015   Procedure: LUMBAR LAMINECTOMY/DECOMPRESSION MICRODISCECTOMY 1 LEVEL Lumbar four five;  Surgeon: Melina Schools, MD;  Location: [place];  Service: Orthopedics;  Laterality: N/A;  . TOTAL HIP ARTHROPLASTY Left    left on 04/15/07  . TYMPANIC MEMBRANE REPAIR  1981    Medications: Prior to Admission medications   Medication Sig Start Date End Date Taking? Authorizing Provider  albuterol (VENTOLIN HFA) 108 (90 Base) MCG/ACT inhaler Inhale 2 puffs into the lungs every 6 (six) hours as needed for wheezing or shortness of breath. 07/31/20  Yes Collene Gobble, MD  amiodarone (PACERONE) 200 MG tablet TAKE 1 TABLET BY MOUTH EVERY DAY Patient taking differently: Take 200 mg by mouth daily.  07/31/20  Yes Fay Records, MD  aspirin 81 MG tablet Take 81 mg by mouth daily.    Yes [provider]  cholecalciferol (VITAMIN D) 1000 units tablet Take 1,000 Units by mouth 2 (two) times daily.   Yes [provider]  ELIQUIS 5 MG TABS tablet TAKE 1 TABLET BY MOUTH TWICE A DAY Patient taking differently: Take 5 mg by mouth 2 (two) times daily.  11/30/19  Yes Fay Records, MD  furosemide (LASIX) 40 MG tablet Take 1 tablet (40 mg total) by mouth as directed. Take 1 tablet every Mon, Wed, Fri 02/06/20  Yes Fay Records, MD  gabapentin (NEURONTIN) 100 MG capsule TAKE 1 CAPSULE (100 MG TOTAL) BY MOUTH 3 (THREE) TIMES DAILY. OFFICE VISIT NEEDED Patient taking differently: Take 100 mg by mouth 3 (three) times daily.  02/19/20  Yes [name], Evie Lacks, MD  latanoprost (XALATAN) 0.005 % ophthalmic solution Place 1 drop into both eyes daily. 05/13/20  Yes [provider]  metFORMIN (GLUCOPHAGE) 500 MG tablet Take 1 tablet (500 mg total) by mouth 3 (three) times daily. 11/26/19  Yes [name], Evie Lacks, MD  metoprolol succinate (TOPROL-XL) 25 MG 24 hr tablet TAKE 1 TABLET BY MOUTH EVERY DAY Patient taking differently: Take 25 mg by mouth daily.   07/31/20  Yes Fay Records, MD  pantoprazole (PROTONIX) 40 MG tablet TAKE 1 TABLET BY MOUTH EVERY DAY Patient taking differently: Take 40 mg by mouth daily.  02/19/20  Yes [name], Evie Lacks, MD  Propylene Glycol (SYSTANE BALANCE) 0.6 % SOLN Place 1 drop into both eyes 2 (two) times daily as needed (for dry eyes).   Yes [provider]  repaglinide (PRANDIN) 2 MG tablet TAKE 1 TABLET BY MOUTH 3 TIMES DAILY BEFORE MEALS. Patient taking differently: Take 2 mg by mouth 3 (three) times daily before meals.  07/30/20  Yes [name], Evie Lacks, MD  simvastatin (ZOCOR) 10 MG tablet TAKE 1 TABLET BY MOUTH EVERY DAY Patient taking differently: Take 10 mg by mouth daily at 6 PM.  01/18/20  Yes Fay Records,  MD  Tiotropium Bromide-Olodaterol (STIOLTO RESPIMAT) 2.5-2.5 MCG/ACT AERS Inhale 2 puffs into the lungs daily. 07/02/20  Yes Martyn Ehrich, NP  Accu-Chek Softclix Lancets lancets Use to check blood sugar daily DX:E11.59 11/01/19   [name], Evie Lacks, MD  Blood Glucose Monitoring Suppl (ACCU-CHEK AVIVA PLUS) w/Device KIT Use to check blood sugar daily DX:E11.59 11/01/19   [name], Evie Lacks, MD  glucose blood (ACCU-CHEK AVIVA PLUS) test strip Use to check blood sugar daily DX:E11.59 11/01/19   [name], Evie Lacks, MD    Allergies:  No Known Allergies  Social History:  reports that he quit smoking about 39 years ago. His smoking use included cigarettes. He has a 60.00 pack-year smoking history. He has never used smokeless tobacco. He reports that he does not drink alcohol and does not use drugs.  Family History: Family History  Problem Relation Age of Onset  . Heart failure Mother   . Heart disease Father   . Cancer Sister   . Cancer Brother   . Heart disease Brother   . Breast cancer Sister   . Colon cancer Neg Hx   . Esophageal cancer Neg Hx   . Rectal cancer Neg Hx   . Stomach cancer Neg Hx      Objective   Physical Exam: Blood pressure (!) 154/78, pulse 81,  temperature 97.8 F (36.6 C), resp. rate 17, height _0  (1.727 m), weight 104.3 kg, SpO2 92 %.  Physical Exam Vitals and nursing note reviewed.  Constitutional:      Appearance: Normal appearance.  HENT:     Head: Normocephalic and atraumatic.  Eyes:     Conjunctiva/sclera: Conjunctivae normal.  Cardiovascular:     Rate and Rhythm: Normal rate and regular rhythm.  Pulmonary:     Effort: Pulmonary effort is normal.     Breath sounds: Normal breath sounds. No rales.     Comments: Hypoxic down to 88% which resolved quickly on 2 L Abdominal:     General: Abdomen is flat.     Palpations: Abdomen is soft.     Comments: Epigastric/periumbilical tenderness to palpation  Musculoskeletal:        General: No swelling or tenderness.     Right lower leg: No tenderness.     Left lower leg: No tenderness.     Comments: Trace lower extremity edema bilaterally  Skin:    Coloration: Skin is not jaundiced or pale.  Neurological:     Mental Status: He is alert. Mental status is at baseline.  Psychiatric:        Mood and Affect: Mood normal.        Behavior: Behavior normal.     LABS on Admission: I have personally reviewed all the labs and imaging below    Basic Metabolic Panel: Recent Labs  Lab 08/04/20 1118  NA 137  K 3.9  CL 103  CO2 21*  GLUCOSE 169*  BUN 15  CREATININE 0.86  CALCIUM 9.1   Liver Function Tests: Recent Labs  Lab 08/04/20 1118  AST 27  ALT 24  ALKPHOS 52  BILITOT 1.1  PROT 8.3*  ALBUMIN 4.5   Recent Labs  Lab 08/04/20 1118  LIPASE 28   No results for input(s): AMMONIA in the last 168 hours. CBC: Recent Labs  Lab 08/04/20 1118  WBC 8.0  NEUTROABS 6.5  HGB 11.0*  HCT 35.6*  MCV 82.4  PLT 283   Cardiac Enzymes: No results for input(s): CKTOTAL, CKMB, CKMBINDEX, TROPONINI in the  last 168 hours. BNP: Invalid input(s): POCBNP CBG: No results for input(s): GLUCAP in the last 168 hours.  Radiological Exams on Admission:  DG Chest 2  View  Result Date: 08/04/2020 CLINICAL DATA:  Shortness of breath. EXAM: CHEST - 2 VIEW COMPARISON:  10/24/2019 FINDINGS: Previous median sternotomy and CABG procedure. Stable cardiac enlargement. Small right pleural effusion and diffuse pulmonary vascular congestion identified. No airspace consolidation. IMPRESSION: Suspect mild CHF. Electronically Signed   By: Kerby Moors M.D.   On: 08/04/2020 08:26   DG Abdomen 1 View  Result Date: 08/04/2020 CLINICAL DATA:  NG tube placement EXAM: ABDOMEN - 1 VIEW COMPARISON:  None. FINDINGS: Esophagogastric tube is positioned with tip and side-port at the level of the gastropathy or junction. Recommend slight advancement to ensure subdiaphragmatic placement of tip and side port. Gas-filled, although nondistended bowel throughout the included abdomen and pelvis. IMPRESSION: Esophagogastric tube is positioned with tip and side-port at the level of the gastropathy or junction. Recommend slight advancement to ensure subdiaphragmatic placement of tip and side port. Electronically Signed   By: Eddie Candle M.D.   On: 08/04/2020 16:40   CT Abdomen Pelvis W Contrast  Result Date: 08/04/2020 CLINICAL DATA:  Bowel obstruction suspected.  Pain and swelling. EXAM: CT ABDOMEN AND PELVIS WITH CONTRAST TECHNIQUE: Multidetector CT imaging of the abdomen and pelvis was performed using the standard protocol following bolus administration of intravenous contrast. CONTRAST:  165m OMNIPAQUE IOHEXOL 300 MG/ML  SOLN COMPARISON:  January 29, 2019 FINDINGS: Lower chest: Small bilateral pleural effusions are identified, right greater than left. No nodules or masses. Reticular changes in the periphery of the lung were also seen in March of 2020, likely interstitial lung disease. No evidence of pneumonia. No other abnormalities in the lower chest. Hepatobiliary: No focal liver abnormality is seen. No gallstones, gallbladder wall thickening, or biliary dilatation. Pancreas: Unremarkable. No  pancreatic ductal dilatation or surrounding inflammatory changes. Spleen: Normal in size without focal abnormality. Adrenals/Urinary Tract: Adrenal glands are unremarkable. Kidneys are normal, without renal calculi, focal lesion, or hydronephrosis. Bladder is unremarkable. Stomach/Bowel: The stomach is normal. The proximal small bowel is normal. There is a segment of ileum with wall thickening best seen on coronal images 37 through 47 in the right side of the abdomen. There is fecal material within this segmentally thickened loop of bowel. There is a relative transition point associated on coronal image 34. Proximal to the transition point is a mildly prominent loop of small bowel containing fecal material. Small bowel remains mildly prominent over at least 30 cm proximal to the transition point. The colon and appendix are normal. Vascular/Lymphatic: Calcified atherosclerosis is seen in the abdominal aorta and at the origins of the branching vessels. There is dense atherosclerosis at the origin of the SMA in particular. Atherosclerotic change extends into the iliac and femoral vessels. No adenopathy. Reproductive: Prostate is unremarkable. Other: No abdominal wall hernia or abnormality. No abdominopelvic ascites. Musculoskeletal: The patient is status post left hip replacement. Degenerative changes are seen in the lumbar spine. IMPRESSION: 1. There is a segment of ileum in the right side of the abdomen which is relatively thick walled as described above. Just proximal to this segmentally thickened loop is at least 30-40 cm of mildly prominent small bowel which contains fecal material and is larger in caliber than the remainder of the small bowel. The segmentally thickened loop of ileum may be secondary to chronic ischemia given the severe atherosclerotic change at the SMA origin. Sequela of  inflammatory bowel disease is also possibility. Recommend consultation with gastroenterology. The segmentally thickened loop  results in slowed transit and a relatively low-grade obstruction. 2. Atherosclerotic change in the abdominal aorta and branching vessels. 3. Small bilateral pleural effusions. 4. Probable interstitial lung disease in the lung bases. Electronically Signed   By: Dorise Bullion III M.D   On: 08/04/2020 13:56      EKG: Independently reviewed.    A & P   Principal Problem:   SBO (small bowel obstruction) (HCC) Active Problems:   Diabetes mellitus, type 2 (HCC)   Paroxysmal atrial fibrillation (HCC)   Abdominal pain   Acute respiratory failure with hypoxia (HCC)   Acute diastolic CHF (congestive heart failure) ([place])   1. Periumbilical pain, suspected partial SBO with history of SBO a. Afebrile and hemodynamically stable b. NG tube in place c. CT with thickening of right sided colon and findings suggestive of chronic ischemia as well as low-grade obstruction d. GI consulted e. General surgery consulted f. N.p.o. pending specialist recommendations  2. Acute hypoxic respiratory failure in the setting of ILD and suspected mild diastolic heart failure exacerbation, Possibly from missing occasional Lasix doses a. On room air at baseline, currently 2 L b. EF 40 to 45% in 2019 c. BNP slightly elevated and with findings consistent with heart failure on CXR d. Given Lasix 40 mg in the ED e. Echo f. Follow-up volume status in a.m. g. Monitor daily weights and intake/output  3. Atrial fibrillation, rate controlled a. On Eliquis, amiodarone and Toprol-XL b. CHA2DS2-VASc at least 5 (age, hypertension, vascular disease, diabetes) c. Currently n.p.o. for partial SBO-put on Lopressor 5 mg twice daily  4. Hypertension a. Lopressor as above  5. Hyperlipidemia a. Holding home statin  6. Diabetes a. Glucose 169 b. Currently n.p.o. c. Follow-up in a.m.  7. History of ILD a. Continue albuterol inhaler b. Follow-up with pulmonology outpatient   DVT prophylaxis: Lovenox   Code Status:  Prior  Diet: N.p.o. Family Communication: Admission, patients condition and plan of care including tests being ordered have been discussed with the patient who indicates understanding and agrees with the plan and Code Status.  Disposition Plan: The appropriate patient status for this patient is INPATIENT. Inpatient status is judged to be reasonable and necessary in order to provide the required intensity of service to ensure the patient's safety. The patient's presenting symptoms, physical exam findings, and initial radiographic and laboratory data in the context of their chronic comorbidities is felt to place them at high risk for further clinical deterioration. Furthermore, it is not anticipated that the patient will be medically stable for discharge from the hospital within 2 midnights of admission. The following factors support the patient status of inpatient.   " The patient's presenting symptoms include abdominal pain, shortness of breath. " The worrisome physical exam findings include abdominal pain, shortness of breath. " The initial radiographic and laboratory data are worrisome because of findings of CHF, partial SBO. " The chronic co-morbidities include ILD, heart failure, diabetes.   * I certify that at the point of admission it is my clinical judgment that the patient will require inpatient hospital care spanning beyond 2 midnights from the point of admission due to high intensity of service, high risk for further deterioration and high frequency of surveillance required.*   Status is: Inpatient  Remains inpatient appropriate because:IV treatments appropriate due to intensity of illness or inability to take PO and Inpatient level of care appropriate due to severity  of illness   Dispo: The patient is from: Home              Anticipated d/c is to: Home              Anticipated d/c date is: 2 days              Patient currently is not medically stable to d/c.     Consultants   . General surgery . GI  Procedures  . NG tube placed  Time Spent on Admission: 65 minutes    Harold Hedge, DO [facility] Pager [phone] 08/04/2020, 6:12 PM

## 2020-08-05 ENCOUNTER — Inpatient Hospital Stay (HOSPITAL_COMMUNITY): Payer: Medicare Other

## 2020-08-05 DIAGNOSIS — I10 Essential (primary) hypertension: Secondary | ICD-10-CM

## 2020-08-05 DIAGNOSIS — I5031 Acute diastolic (congestive) heart failure: Secondary | ICD-10-CM

## 2020-08-05 DIAGNOSIS — R933 Abnormal findings on diagnostic imaging of other parts of digestive tract: Secondary | ICD-10-CM

## 2020-08-05 LAB — COMPREHENSIVE METABOLIC PANEL
ALT: 22 U/L (ref 0–44)
AST: 25 U/L (ref 15–41)
Albumin: 3.8 g/dL (ref 3.5–5.0)
Alkaline Phosphatase: 44 U/L (ref 38–126)
Anion gap: 13 (ref 5–15)
BUN: 15 mg/dL (ref 8–23)
CO2: 26 mmol/L (ref 22–32)
Calcium: 9 mg/dL (ref 8.9–10.3)
Chloride: 101 mmol/L (ref 98–111)
Creatinine, Ser: 0.94 mg/dL (ref 0.61–1.24)
GFR calc Af Amer: >60 mL/min (ref >60)
GFR calc non Af Amer: >60 mL/min (ref >60)
Glucose, Bld: 137 mg/dL — ABNORMAL HIGH (ref 70–99)
Potassium: 3.5 mmol/L (ref 3.5–5.1)
Sodium: 140 mmol/L (ref 135–145)
Total Bilirubin: 0.8 mg/dL (ref 0.3–1.2)
Total Protein: 7.1 g/dL (ref 6.5–8.1)

## 2020-08-05 LAB — ECHOCARDIOGRAM COMPLETE
Area-P 1/2: 4.31 cm2
Height: 68 in
S' Lateral: 3.1 cm
Weight: 3534.41 oz

## 2020-08-05 LAB — CBC
HCT: 35.7 % — ABNORMAL LOW (ref 39.0–52.0)
Hemoglobin: 10.7 g/dL — ABNORMAL LOW (ref 13.0–17.0)
MCH: 24.9 pg — ABNORMAL LOW (ref 26.0–34.0)
MCHC: 30 g/dL (ref 30.0–36.0)
MCV: 83.2 fL (ref 80.0–100.0)
Platelets: 272 10*3/uL (ref 150–400)
RBC: 4.29 MIL/uL (ref 4.22–5.81)
RDW: 16.1 % — ABNORMAL HIGH (ref 11.5–15.5)
WBC: 6.8 10*3/uL (ref 4.0–10.5)
nRBC: 0 % (ref 0.0–0.2)

## 2020-08-05 LAB — GLUCOSE, CAPILLARY
Glucose-Capillary: 120 mg/dL — ABNORMAL HIGH (ref 70–99)
Glucose-Capillary: 124 mg/dL — ABNORMAL HIGH (ref 70–99)

## 2020-08-05 LAB — MAGNESIUM: Magnesium: 2.3 mg/dL (ref 1.7–2.4)

## 2020-08-05 LAB — BRAIN NATRIURETIC PEPTIDE: B Natriuretic Peptide: 312.8 pg/mL — ABNORMAL HIGH (ref 0.0–100.0)

## 2020-08-05 MED ORDER — IOHEXOL 350 MG/ML SOLN
100.0000 mL | Freq: Once | INTRAVENOUS | Status: AC | PRN
  Administered 2020-08-05: 100 mL via INTRAVENOUS

## 2020-08-05 MED ORDER — FUROSEMIDE 10 MG/ML IJ SOLN
20.0000 mg | Freq: Two times a day (BID) | INTRAMUSCULAR | Status: DC
  Administered 2020-08-05 – 2020-08-06 (×3): 20 mg via INTRAVENOUS
  Filled 2020-08-05 (×3): qty 2

## 2020-08-05 MED ORDER — PERFLUTREN LIPID MICROSPHERE
1.0000 mL | INTRAVENOUS | Status: AC | PRN
  Administered 2020-08-05: 3 mL via INTRAVENOUS
  Filled 2020-08-05: qty 10

## 2020-08-05 MED ORDER — INFLUENZA VAC A&B SA ADJ QUAD 0.5 ML IM PRSY
0.5000 mL | PREFILLED_SYRINGE | INTRAMUSCULAR | Status: DC | PRN
  Filled 2020-08-05: qty 0.5

## 2020-08-05 MED ORDER — INSULIN ASPART 100 UNIT/ML ~~LOC~~ SOLN
0.0000 [IU] | Freq: Three times a day (TID) | SUBCUTANEOUS | Status: DC
  Administered 2020-08-06 (×2): 2 [IU] via SUBCUTANEOUS
  Administered 2020-08-06: 1 [IU] via SUBCUTANEOUS
  Administered 2020-08-07: 2 [IU] via SUBCUTANEOUS

## 2020-08-05 MED ORDER — POTASSIUM CHLORIDE 10 MEQ/100ML IV SOLN
10.0000 meq | INTRAVENOUS | Status: AC
  Administered 2020-08-05 (×4): 10 meq via INTRAVENOUS
  Filled 2020-08-05 (×3): qty 100

## 2020-08-05 MED ORDER — FUROSEMIDE 10 MG/ML IJ SOLN: 40.0000 mg | Freq: Once | INTRAMUSCULAR | Status: DC

## 2020-08-05 MED ORDER — FUROSEMIDE 10 MG/ML IJ SOLN
20.0000 mg | Freq: Once | INTRAMUSCULAR | Status: AC
  Administered 2020-08-05: 20 mg via INTRAVENOUS
  Filled 2020-08-05: qty 2

## 2020-08-05 NOTE — Progress Notes (Addendum)
Alerted by central tele that pt has been having frequent PVCs and had a 5 beat run of PVCs. Pt is awake, says he feels just fine and denies any symptoms. On call NP notified. Continue to monitor. Hortencia Conradi RN

## 2020-08-05 NOTE — Consult Note (Addendum)
Consultation  Referring Provider: Autumn Messing, MD Primary Care Physician:  Cassandria Anger, MD Primary Gastroenterologist:  Dr. Fuller Plan  Reason for Consultation:  SBO  HPI: [name] is a 85 y.o. male, known to Dr. Fuller Plan, who we are asked to see for recurrent partial small bowel obstruction. Patient has history of adult onset diabetes mellitus, hypertension, coronary artery disease status post CABG, history of atrial fibrillation, cardiomyopathy with EF 40 to 45% and history of COVID-04 November 2019 with persistent complaints of shortness of breath. Patient has history of partial small bowel obstruction 2016 for which he was hospitalized-obstruction resolved with conservative management.  He has not had any prior abdominal surgery. Patient had onset on Saturday, 08/03/2020 with progressive mid abdominal pain, somewhat wavelike and varying in intensity.  He says this was excruciating and 10 out of 10 at its worse.  He presented to the emergency room.  He relates feeling well over the previous several weeks without any GI symptoms.  Bowel movements generally regular occasional mild constipation.  Appetite has been fine.  He did not have any associated nausea or vomiting with this episode, and was having normal bowel movements up until onset.  He has not passed any flatus or bowel movement since onset. Patient relates feeling much better this morning without any significant pain.  He has not had a bowel movement or passed any flatus.  CT of the abdomen pelvis on admission shows a segment of ileum in the right abdomen relatively thick-walled and just proximal to this is at least 30 to 40 cm of mildly prominent small bowel containing fecal material.  There is thickened loop may be secondary to chronic ischemia given severe atherosclerotic change of the SMA origin, cannot rule out IBD.  This segmentally thickened loop results in slow transit and low-grade obstruction. Repeat abdominal films  this morning no obstruction  Labs on admit WBC 8, hemoglobin 11, creatinine 0.86, albumin 4.5, LFTs within normal limits    Past Medical History:  Diagnosis Date   Acute diastolic CHF (congestive heart failure) ([place]) 08/04/2020   Arthritis    Atrial fibrillation ([place])    post op; amiodarone and coumadin continued for 3 mos post op   CARCINOMA, SKIN, SQUAMOUS CELL 01/07/2010   Cataract    beginning of cataracts   COLONIC POLYPS, HX OF 04/26/2007   Coronary artery disease    s/p CABG 3/12: L-LAD, S-CFX (Dr. Roxy Manns); EF 45% at cath prior to CABG   DIABETES MELLITUS, TYPE II 04/26/2007   Hyperlipidemia    HYPERTENSION 04/26/2007   NEPHROLITHIASIS, HX OF 04/26/2007   SBO (small bowel obstruction) ([place]) 10/2015   Partial    Shortness of breath dyspnea    with exertion   Unspecified hearing loss 12/18/2008    Past Surgical History:  Procedure Laterality Date   BACK SURGERY     disectomy   CARDIOVERSION N/A 06/08/2018   Procedure: CARDIOVERSION;  Surgeon: Buford Dresser, MD;  Location: [facility] ENDOSCOPY;  Service: Cardiovascular;  Laterality: N/A;   CARDIOVERSION N/A 07/25/2018   Procedure: CARDIOVERSION;  Surgeon: Fay Records, MD;  Location: [facility] ENDOSCOPY;  Service: Cardiovascular;  Laterality: N/A;   COLONOSCOPY     CORONARY ARTERY BYPASS GRAFT  01/12/2011   LUMBAR LAMINECTOMY/DECOMPRESSION MICRODISCECTOMY N/A 05/08/2015   Procedure: LUMBAR LAMINECTOMY/DECOMPRESSION MICRODISCECTOMY 1 LEVEL Lumbar four five;  Surgeon: Melina Schools, MD;  Location: [place];  Service: Orthopedics;  Laterality: N/A;   TOTAL HIP ARTHROPLASTY Left    left  on 04/15/07   [place]    Prior to Admission medications   Medication Sig Start Date End Date Taking? Authorizing Provider  albuterol (VENTOLIN HFA) 108 (90 Base) MCG/ACT inhaler Inhale 2 puffs into the lungs every 6 (six) hours as needed for wheezing or shortness of breath. 07/31/20  Yes Collene Gobble, MD    amiodarone (PACERONE) 200 MG tablet TAKE 1 TABLET BY MOUTH EVERY DAY Patient taking differently: Take 200 mg by mouth daily.  07/31/20  Yes Fay Records, MD  aspirin 81 MG tablet Take 81 mg by mouth daily.    Yes [provider]  cholecalciferol (VITAMIN D) 1000 units tablet Take 1,000 Units by mouth 2 (two) times daily.   Yes [provider]  ELIQUIS 5 MG TABS tablet TAKE 1 TABLET BY MOUTH TWICE A DAY Patient taking differently: Take 5 mg by mouth 2 (two) times daily.  11/30/19  Yes Fay Records, MD  furosemide (LASIX) 40 MG tablet Take 1 tablet (40 mg total) by mouth as directed. Take 1 tablet every Mon, Wed, Fri 02/06/20  Yes Fay Records, MD  gabapentin (NEURONTIN) 100 MG capsule TAKE 1 CAPSULE (100 MG TOTAL) BY MOUTH 3 (THREE) TIMES DAILY. OFFICE VISIT NEEDED Patient taking differently: Take 100 mg by mouth 3 (three) times daily.  02/19/20  Yes [name], Evie Lacks, MD  latanoprost (XALATAN) 0.005 % ophthalmic solution Place 1 drop into both eyes daily. 05/13/20  Yes [provider]  metFORMIN (GLUCOPHAGE) 500 MG tablet Take 1 tablet (500 mg total) by mouth 3 (three) times daily. 11/26/19  Yes [name], Evie Lacks, MD  metoprolol succinate (TOPROL-XL) 25 MG 24 hr tablet TAKE 1 TABLET BY MOUTH EVERY DAY Patient taking differently: Take 25 mg by mouth daily.  07/31/20  Yes Fay Records, MD  pantoprazole (PROTONIX) 40 MG tablet TAKE 1 TABLET BY MOUTH EVERY DAY Patient taking differently: Take 40 mg by mouth daily.  02/19/20  Yes [name], Evie Lacks, MD  Propylene Glycol (SYSTANE BALANCE) 0.6 % SOLN Place 1 drop into both eyes 2 (two) times daily as needed (for dry eyes).   Yes [provider]  repaglinide (PRANDIN) 2 MG tablet TAKE 1 TABLET BY MOUTH 3 TIMES DAILY BEFORE MEALS. Patient taking differently: Take 2 mg by mouth 3 (three) times daily before meals.  07/30/20  Yes [name], Evie Lacks, MD  simvastatin (ZOCOR) 10 MG tablet TAKE 1 TABLET BY MOUTH EVERY  DAY Patient taking differently: Take 10 mg by mouth daily at 6 PM.  01/18/20  Yes Fay Records, MD  Tiotropium Bromide-Olodaterol (STIOLTO RESPIMAT) 2.5-2.5 MCG/ACT AERS Inhale 2 puffs into the lungs daily. 07/02/20  Yes Martyn Ehrich, NP  Accu-Chek Softclix Lancets lancets Use to check blood sugar daily DX:E11.59 11/01/19   [name], Evie Lacks, MD  Blood Glucose Monitoring Suppl (ACCU-CHEK AVIVA PLUS) w/Device KIT Use to check blood sugar daily DX:E11.59 11/01/19   [name], Evie Lacks, MD  glucose blood (ACCU-CHEK AVIVA PLUS) test strip Use to check blood sugar daily DX:E11.59 11/01/19   [name], Evie Lacks, MD    Current Facility-Administered Medications  Medication Dose Route Frequency Provider Last Rate Last Admin   dextromethorphan-guaiFENesin ([place] DM) 30-600 MG per 12 hr tablet 1 tablet  1 tablet Oral BID Blount, Scarlette Shorts T, NP       enoxaparin (LOVENOX) injection 40 mg  40 mg Subcutaneous Q24H Harold Hedge, MD   40 mg at 08/04/20 2158  metoprolol tartrate (LOPRESSOR) injection 5 mg  5 mg Intravenous Q12H Harold Hedge, MD       ondansetron [facility]) tablet 4 mg  4 mg Oral Q6H PRN Harold Hedge, MD       Or   ondansetron [facility]) injection 4 mg  4 mg Intravenous Q6H PRN Harold Hedge, MD       perflutren lipid microspheres (DEFINITY) IV suspension  1-10 mL Intravenous PRN Harold Hedge, MD   3 mL at 08/05/20 0837   sodium chloride flush (NS) 0.9 % injection 3 mL  3 mL Intravenous Q12H Harold Hedge, MD   3 mL at 08/04/20 2201    Allergies as of 08/04/2020   (No Known Allergies)    Family History  Problem Relation Age of Onset   Heart failure Mother    Heart disease Father    Cancer Sister    Cancer Brother    Heart disease Brother    Breast cancer Sister    Colon cancer Neg Hx    Esophageal cancer Neg Hx    Rectal cancer Neg Hx    Stomach cancer Neg Hx     Social History   Socioeconomic History   Marital status: Married    Spouse name:  Not on file   Number of children: Not on file   Years of education: Not on file   Highest education level: Not on file  Occupational History   Not on file  Tobacco Use   Smoking status: Former Smoker    Packs/day: 2.00    Years: 30.00    Pack years: 60.00    Types: Cigarettes    Quit date: 01/02/1981    Years since quitting: 39.6   Smokeless tobacco: Never Used  Vaping Use   Vaping Use: Never used  Substance and Sexual Activity   Alcohol use: No    Alcohol/week: 0.0 standard drinks   Drug use: No   Sexual activity: Not on file  Other Topics Concern   Not on file  Social History Narrative   Not on file   Social Determinants of Health   Financial Resource Strain:    Difficulty of Paying Living Expenses: Not on file  Food Insecurity:    Worried About Charity fundraiser in the Last Year: Not on file   [employer] of Food in the Last Year: Not on file  Transportation Needs:    Lack of Transportation (Medical): Not on file   Lack of Transportation (Non-Medical): Not on file  Physical Activity:    Days of Exercise per Week: Not on file   Minutes of Exercise per Session: Not on file  Stress:    Feeling of Stress : Not on file  Social Connections:    Frequency of Communication with Friends and Family: Not on file   Frequency of Social Gatherings with Friends and Family: Not on file   Attends Religious Services: Not on file   Active Member of Clubs or Organizations: Not on file   Attends Archivist Meetings: Not on file   Marital Status: Not on file  Intimate Partner Violence:    Fear of Current or Ex-Partner: Not on file   Emotionally Abused: Not on file   Physically Abused: Not on file   Sexually Abused: Not on file    Review of Systems: Pertinent positive and negative review of systems were noted in the above HPI section.  All other review of systems was  otherwise negative.  Physical Exam: Vital signs in last 24 hours: Temp:   [97.7 F (36.5 C)-98.7 F (37.1 C)] 98.6 F (37 C) (09/20 0718) Pulse Rate:  [74-86] 81 (09/20 0718) Resp:  [16-22] 20 (09/20 0718) BP: (121-173)/(43-87) 135/74 (09/20 0718) SpO2:  [87 %-99 %] 95 % (09/20 0718) Weight:  [100.2 kg] 100.2 kg (09/20 0500) Last BM Date: 08/03/20 General:   Alert,  Well-developed, well-nourished, elderly white male pleasant and cooperative in NAD Head:  Normocephalic and atraumatic. Eyes:  Sclera clear, no icterus.   Conjunctiva pink. Ears:  Normal auditory acuity. Nose:  No deformity, discharge,  or lesions. Mouth:  No deformity or lesions.   Neck:  Supple; no masses or thyromegaly. Lungs:  Clear throughout to auscultation.   No wheezes, crackles, or rhonchi.  Heart:  Regular rate and rhythm; no murmurs, clicks, rubs,  or gallops.  Sternotomy scar Abdomen:  Soft, BS somewhat hypoactive no significant abdominal tenderness, no guarding or rebound no palpable mass or hepatosplenomegaly.   Rectal:  Deferred  Msk:  Symmetrical without gross deformities. . Pulses:  Normal pulses noted. Extremities:  Without clubbing or edema. Neurologic:  Alert and  oriented x4;  grossly normal neurologically. Skin:  Intact without significant lesions or rashes.. Psych:  Alert and cooperative. Normal mood and affect.  Intake/Output from previous day: 09/19 0701 - 09/20 0700 In: 0  Out: 150 [Urine:150] Intake/Output this shift: No intake/output data recorded.  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 Question of small bowel ischemia raised, cannot rule out IBD though patient has had no interim symptoms since last partial obstruction 5 years ago. No prior abdominal surgery  #2 coronary artery disease status post CABG #3 left ventricular dysfunction with EF 40 to 45% #4 adult onset diabetes mellitus #5 atrial fib-on chronic Eliquis # 6 interstitial lung disease-persistent shortness of breath since COVID-04 November 2019 #7 history of adenomatous colon polyps-last colonoscopy 2016, 1 polyp removed no follow-up recommended due to age  PLAN: Start clear liquids Scheduled for CT angio of the abdomen-further assess mesenteric vasculature Consider CT enterography   Thank you. Will follow with you   Amy EsterwoodPA-C  08/05/2020, 9:40 AM      Attending Physician Note   I have taken an interval history, reviewed the chart and examined the patient. I evaluated the patient and formulated the impression and recommendations with the Advanced Practitioner so I agree with the note, impression and recommendations.   Lucio Edward, MD [facility]

## 2020-08-05 NOTE — Progress Notes (Addendum)
CC: Abdominal pain  Subjective:  He feels better this AM, some discomfort around lower mid abdomen, but denies flatus or BM since Saturday, 2 days ago.  He does have times where he does not have a BM for 2-3 days at atime.  Objective: Vital signs in last 24 hours: Temp:  [97.7 F (36.5 C)-98.7 F (37.1 C)] 98.6 F (37 C) (09/20 0718) Pulse Rate:  [74-86] 81 (09/20 0718) Resp:  [16-22] 20 (09/20 0718) BP: (121-173)/(43-87) 135/74 (09/20 0718) SpO2:  [87 %-99 %] 95 % (09/20 0718) Weight:  [100.2 kg] 100.2 kg (09/20 0500) Last BM Date: 08/03/20  Nothing p.o. or IV recorded. Urine 150 No BM recorded. Afebrile vital signs are stable. CMP is normal except for glucose of 137.  WBC 6.8 H/H 10.7/35.5. Platelets 272,000 BNP 475.7 Troponin 21>> 25 Covid's currently negative UA unremarkable CT 9/19: There is a segment of ileum in the right side of the abdomen which is relatively thick-walled just proximal to the segmental thickened loop is a 30 to 40 cm of mildly prominent small bowel which contains fecal material and is larger in caliber and the remainder of the small bowel.  The segmentally thickened loop of ileum may be secondary to chronic ischemia given severe atherosclerosis noted the SMA origin.  Sequela of's inflammatory bowel disease also possible. Atherosclerotic changes of the abdominal aorta and branching vessels are present.  Small bilateral pleural effusions, probable interstitial lung disease in the lung bases.  A.m. abdominal film today: Negative for obstructive pattern or over distended bowel.  No concerning mass-effect or gas collection moderate distention of the bladder by contrast, left hip arthroplasty  Intake/Output from previous day: 09/19 0701 - 09/20 0700 In: 0  Out: 150 [Urine:150] Intake/Output this shift: No intake/output data recorded.  General appearance: alert, cooperative and no distress Resp: complains of SOB x 6 months, on O2, getting Echo  currently, I did not listen to his chest.  GI: soft, non tender, + BS, NO BM or flatus.    Lab Results:  Recent Labs    08/04/20 1118 08/05/20 0552  WBC 8.0 6.8  HGB 11.0* 10.7*  HCT 35.6* 35.7*  PLT 283 272    BMET Recent Labs    08/04/20 1118 08/05/20 0552  NA 137 140  K 3.9 3.5  CL 103 101  CO2 21* 26  GLUCOSE 169* 137*  BUN 15 15  CREATININE 0.86 0.94  CALCIUM 9.1 9.0   PT/INR No results for input(s): LABPROT, INR in the last 72 hours.  Recent Labs  Lab 08/04/20 1118 08/05/20 0552  AST 27 25  ALT 24 22  ALKPHOS 52 44  BILITOT 1.1 0.8  PROT 8.3* 7.1  ALBUMIN 4.5 3.8     Lipase     Component Value Date/Time   LIPASE 28 08/04/2020 1118     Medications: . dextromethorphan-guaiFENesin  1 tablet Oral BID  . enoxaparin (LOVENOX) injection  40 mg Subcutaneous Q24H  . metoprolol tartrate  5 mg Intravenous Q12H  . sodium chloride flush  3 mL Intravenous Q12H    Anti-infectives (From admission, onward)   None      Assessment/Plan Acute hypoxic respiratory failure Interstitial lung disease COVID-19 (10/2019) Suspected mild diastolic heart failure Atrial fibrillation -Eliquis/amiodarone/Toprol Hypertension Type 2 diabetes  Abdominal pain  Questionable partial small bowel obstruction vs enteritis Hx PSBO 11/06/15 - resolved with Bowel rest after 48 hrs  FEN: N.p.o./no IVs listed ID: None DVT: Lovenox Follow-up: TBD  Plan:  Bladder scan, he says he is voiding, not much listed from yesterday or today.  Await Gi evaluation. He has a lot of air in his colon and some in the SB also.    If everyone is agreeable, we may use the small bowel protocol later after GI see's him.     LOS: 1 day    JENNINGS,WILLARD 08/05/2020 Please see Amion  Agree with above. Seen by Dr. Fuller Plan with GI. No prior abdominal surgery. Now passing flatus.  Alphonsa Overall, MD, [facility] Office phone:  [phone]

## 2020-08-05 NOTE — Plan of Care (Signed)
  Problem: Education: Goal: Knowledge of General Education information will improve Description Including pain rating scale, medication(s)/side effects and non-pharmacologic comfort measures Outcome: Progressing   Problem: Health Behavior/Discharge Planning: Goal: Ability to manage health-related needs will improve Outcome: Progressing   

## 2020-08-05 NOTE — Progress Notes (Signed)
  Echocardiogram 2D Echocardiogram has been performed.  Jennette Dubin 08/05/2020, 8:50 AM

## 2020-08-05 NOTE — Progress Notes (Signed)
Bladder scan revealed 167ml. Per patient he has been urinating with no problems. Provided patient was a urinal and educated patient on urinating in urinal so staff can measure. Patient verbalized understanding.

## 2020-08-05 NOTE — Progress Notes (Signed)
PROGRESS NOTE    [name]  NWG:[id number] DOB: 07/09/1935 DOA: 08/04/2020 PCP: Cassandria Anger, MD    Chief Complaint  Patient presents with  . Shortness of Breath  . Abdominal Pain    Brief Narrative:  HPI per Dr. Neysa Bonito This is an 85 year old male with past medical history of diabetes, hypertension, hyperlipidemia, CAD s/p CABG, atrial fibrillation on Eliquis, partial SBO, ILD, COVID-19 (December 2020) HFpEF (last EF 40 to 45% in 2019) who presented to the ED with complaints of gradual onset, constant waxing and waning periumbilical abdominal pain x12 hours with associated nausea similar to prior SBO.  Reportedly had a normal BM yesterday.  Still passing gas feels as though the abdominal pain is making him short of breath.  He is supposed to take Lasix 3 times a week however frequently forgets.  Of note he had COVID-19 in December 2020 and has been having shortness of breath since that time however has been worsening recently and was sent to a pulmonologist.  Had a CT chest on 7/22 which showed fibrotic ILD with slight basilar predominance categorized as probable UIP.  Had a pulmonology visit with Dr. Lamonte Sakai on 9/15 who felt this was probably from his COVID-19 and had PFTs at the time which showed some restriction with superimposed obstruction and was prescribed albuterol.  ED Course: Afebrile, hypertensive and tachypneic with SPO2 87% on room air requiring 2 L.  Labs: CO2 21, glucose 169, BNP 475, troponin 21-> 25, WBC 8.0, Hb 11.0, UA with ketones.  CXR with findings suggestive of mild CHF.  CT abdomen pelvis: Findings suggestive of chronic ischemia VS.  Sequelae of inflammatory bowel disease and relatively low-grade obstruction.  ED PA discussed with Dr. Silverio Decamp, GI will see in a.m. and Dr. Marlou Starks, general surgery, who will evaluate the patient.    Assessment & Plan:   Principal Problem:   SBO (small bowel obstruction) (HCC) Active Problems:   Diabetes mellitus, type  2 (HCC)   Paroxysmal atrial fibrillation (HCC)   Abdominal pain   Acute respiratory failure with hypoxia (HCC)   Acute diastolic CHF (congestive heart failure) (HCC)   Essential hypertension   #1 periumbilical pain, suspected partial small bowel obstruction with history of small bowel obstruction Patient presented with abdominal pain.  Last bowel movement was 2 days ago.  Afebrile.  Hemodynamically stable.  NG tube was placed however during my assessment patient with no NG tube.  CT abdomen and pelvis done with thickening of right-sided colon and findings suggestive of chronic ischemia as well as low-grade obstruction.  Patient passing flatus.  No bowel movement.  States improvement with abdominal pain.  General surgery and GI consulted.  Patient seen by general surgery small bowel protocol if GI agreeable.  Patient seen by GI and patient started on clears.  GI have ordered a CT angiogram of the abdomen to further assess mesenteric vasculature.  Per GI general surgery.  2.  Acute hypoxic respiratory failure in the setting of ILD and suspected mild acute diastolic CHF exacerbation, possibly from missing occasional Lasix doses Patient noted to be on room air at baseline.  On admission was requiring 2 L nasal cannula.  2D echo with a EF of 55 to 60%, no wall motion abnormality, grade 2 diastolic dysfunction, elevated left ventricular end-diastolic pressure, mildly dilated left atrial size.  Patient received a dose of Lasix 40 mg IV x1 in the ED.  Urine output not properly recorded.  Placed on Lasix 20 mg IV  every 12 hours.  Strict I's and O's.  Daily weights. Resume home regimen oral Lasix once diet has been advanced.  Supportive care.  Follow.  3.  Paroxysmal atrial fibrillation CHA2DS2VASC score at least 5 (age, hypertension, vascular disease, diabetes).  Patient noted to be on Toprol-XL, amiodarone, Eliquis prior to admission.  As patient was n.p.o. patient was placed on Lopressor 5 mg IV every 12  hours which will continue for rate control.  Eliquis on hold just in case patient may need procedures.  Resume Eliquis when okay with general surgery and GI.  Follow.  4.  Hypertension IV Lopressor.  5.  Hyperlipidemia Statin on hold.  Likely resume on discharge.  6.  History of ILD Continue albuterol inhaler.  Outpatient follow-up with pulmonary.  7.  Diabetes mellitus type 2 Last hemoglobin A1c 6.5 (11/04/2019).  Blood glucose level of 137.  Basic metabolic profile this morning.  Check a hemoglobin A1c.  Sliding scale insulin.  Follow.    DVT prophylaxis: Lovenox Code Status: Partial Family Communication: Updated patient.  No family at bedside. Disposition:   Status is: Inpatient    Dispo: The patient is from: Home              Anticipated d/c is to: To be determined              Anticipated d/c date is: To be determined              Patient currently being assessed by general surgery and gastroenterology.  Concern for partial small bowel obstruction.  Not stable for discharge.       Consultants:   General surgery: Dr. Marlou Starks 08/04/2020  Gastroenterology: Dr. Fuller Plan 08/05/2020  Procedures:  CT abdomen and pelvis 08/04/2020  CT angiogram abdomen and pelvis pending  Abdominal films 08/04/2020, 08/05/2020  2D echo 08/05/2020  Antimicrobials:   None   Subjective: Patient sitting up in bed watching television.  Passing flatus.  No bowel movement.  States improvement with abdominal pain.  Denies any chest pain.  Denies any significant shortness of breath.  Tolerating clears.  Objective: Vitals:   08/05/20 0718 08/05/20 1002 08/05/20 1101 08/05/20 1454  BP: 135/74 (!) 147/68  (!) 152/60  Pulse: 81 76  75  Resp: 20   (!) 21  Temp: 98.6 F (37 C)   98.1 F (36.7 C)  TempSrc: Oral   Oral  SpO2: 95%   100%  Weight:   102.2 kg   Height:        Intake/Output Summary (Last 24 hours) at 08/05/2020 1721 Last data filed at 08/05/2020 1143 Gross per 24 hour  Intake 0  ml  Output 650 ml  Net -650 ml   Filed Weights   08/04/20 0723 08/05/20 0500 08/05/20 1101  Weight: 104.3 kg 100.2 kg 102.2 kg    Examination:  General exam: Appears calm and comfortable  Respiratory system: Some scattered crackles.  No wheezing.  Fair air movement.  Speaking in full sentences.   Cardiovascular system: S1 & S2 heard, RRR. No JVD, murmurs, rubs, gallops or clicks. No pedal edema. Gastrointestinal system: Abdomen is nondistended, soft and tenderness to palpation periumbilical region.  Positive bowel sounds.  No rebound.  No guarding.  Central nervous system: Alert and oriented. No focal neurological deficits. Extremities: Symmetric 5 x 5 power. Skin: No rashes, lesions or ulcers Psychiatry: Judgement and insight appear normal. Mood & affect appropriate.     Data Reviewed: I have personally reviewed following labs  and imaging studies  CBC: Recent Labs  Lab 08/04/20 1118 08/05/20 0552  WBC 8.0 6.8  NEUTROABS 6.5  --   HGB 11.0* 10.7*  HCT 35.6* 35.7*  MCV 82.4 83.2  PLT 283 268    Basic Metabolic Panel: Recent Labs  Lab 08/04/20 1118 08/05/20 0552  NA 137 140  K 3.9 3.5  CL 103 101  CO2 21* 26  GLUCOSE 169* 137*  BUN 15 15  CREATININE 0.86 0.94  CALCIUM 9.1 9.0  MG  --  2.3    GFR: Estimated Creatinine Clearance: 69 mL/min (by C-G formula based on SCr of 0.94 mg/dL).  Liver Function Tests: Recent Labs  Lab 08/04/20 1118 08/05/20 0552  AST 27 25  ALT 24 22  ALKPHOS 52 44  BILITOT 1.1 0.8  PROT 8.3* 7.1  ALBUMIN 4.5 3.8    CBG: No results for input(s): GLUCAP in the last 168 hours.   Recent Results (from the past 240 hour(s))  SARS Coronavirus 2 by RT PCR (hospital order, performed in [facility] hospital lab) Nasopharyngeal Nasopharyngeal Swab     Status: None   Collection Time: 08/04/20 12:51 PM   Specimen: Nasopharyngeal Swab  Result Value Ref Range Status   SARS Coronavirus 2 NEGATIVE NEGATIVE Final    Comment:  (NOTE) SARS-CoV-2 target nucleic acids are NOT DETECTED.  The SARS-CoV-2 RNA is generally detectable in upper and lower respiratory specimens during the acute phase of infection. The lowest concentration of SARS-CoV-2 viral copies this assay can detect is 250 copies / mL. A negative result does not preclude SARS-CoV-2 infection and should not be used as the sole basis for treatment or other patient management decisions.  A negative result may occur with improper specimen collection / handling, submission of specimen other than nasopharyngeal swab, presence of viral mutation(s) within the areas targeted by this assay, and inadequate number of viral copies (<250 copies / mL). A negative result must be combined with clinical observations, patient history, and epidemiological information.  Fact Sheet for Patients:   StrictlyIdeas.no  Fact Sheet for Healthcare Providers: BankingDealers.co.za  This test is not yet approved or  cleared by the [place] FDA and has been authorized for detection and/or diagnosis of SARS-CoV-2 by FDA under an Emergency Use Authorization (EUA).  This EUA will remain in effect (meaning this test can be used) for the duration of the COVID-19 declaration under Section 564(b)(1) of the Act, 21 U.S.C. section 360bbb-3(b)(1), unless the authorization is terminated or revoked sooner.  Performed at [facility], [place] [address] [place], [place] [address]          Radiology Studies: DG Chest 2 View  Result Date: 08/04/2020 CLINICAL DATA:  Shortness of breath. EXAM: CHEST - 2 VIEW COMPARISON:  10/24/2019 FINDINGS: Previous median sternotomy and CABG procedure. Stable cardiac enlargement. Small right pleural effusion and diffuse pulmonary vascular congestion identified. No airspace consolidation. IMPRESSION: Suspect mild CHF. Electronically Signed   By: Kerby Moors M.D.   On: 08/04/2020 08:26    DG Abd 1 View  Result Date: 08/05/2020 CLINICAL DATA:  Follow-up small bowel obstruction EXAM: ABDOMEN - 1 VIEW COMPARISON:  Yesterday FINDINGS: Negative for obstructive pattern or over distended bowel. No concerning mass effect or gas collection. Moderate distension of the bladder by contrast. Left hip arthroplasty. IMPRESSION: No gas dilated bowel. Moderate distension of the bladder. Electronically Signed   By: Monte Fantasia M.D.   On: 08/05/2020 07:43   DG Abdomen 1 View  Result Date: 08/04/2020 CLINICAL DATA:  NG tube placement EXAM: ABDOMEN - 1 VIEW COMPARISON:  None. FINDINGS: Esophagogastric tube is positioned with tip and side-port at the level of the gastropathy or junction. Recommend slight advancement to ensure subdiaphragmatic placement of tip and side port. Gas-filled, although nondistended bowel throughout the included abdomen and pelvis. IMPRESSION: Esophagogastric tube is positioned with tip and side-port at the level of the gastropathy or junction. Recommend slight advancement to ensure subdiaphragmatic placement of tip and side port. Electronically Signed   By: Eddie Candle M.D.   On: 08/04/2020 16:40   CT Abdomen Pelvis W Contrast  Result Date: 08/04/2020 CLINICAL DATA:  Bowel obstruction suspected.  Pain and swelling. EXAM: CT ABDOMEN AND PELVIS WITH CONTRAST TECHNIQUE: Multidetector CT imaging of the abdomen and pelvis was performed using the standard protocol following bolus administration of intravenous contrast. CONTRAST:  172mL OMNIPAQUE IOHEXOL 300 MG/ML  SOLN COMPARISON:  January 29, 2019 FINDINGS: Lower chest: Small bilateral pleural effusions are identified, right greater than left. No nodules or masses. Reticular changes in the periphery of the lung were also seen in March of 2020, likely interstitial lung disease. No evidence of pneumonia. No other abnormalities in the lower chest. Hepatobiliary: No focal liver abnormality is seen. No gallstones, gallbladder wall  thickening, or biliary dilatation. Pancreas: Unremarkable. No pancreatic ductal dilatation or surrounding inflammatory changes. Spleen: Normal in size without focal abnormality. Adrenals/Urinary Tract: Adrenal glands are unremarkable. Kidneys are normal, without renal calculi, focal lesion, or hydronephrosis. Bladder is unremarkable. Stomach/Bowel: The stomach is normal. The proximal small bowel is normal. There is a segment of ileum with wall thickening best seen on coronal images 37 through 47 in the right side of the abdomen. There is fecal material within this segmentally thickened loop of bowel. There is a relative transition point associated on coronal image 34. Proximal to the transition point is a mildly prominent loop of small bowel containing fecal material. Small bowel remains mildly prominent over at least 30 cm proximal to the transition point. The colon and appendix are normal. Vascular/Lymphatic: Calcified atherosclerosis is seen in the abdominal aorta and at the origins of the branching vessels. There is dense atherosclerosis at the origin of the SMA in particular. Atherosclerotic change extends into the iliac and femoral vessels. No adenopathy. Reproductive: Prostate is unremarkable. Other: No abdominal wall hernia or abnormality. No abdominopelvic ascites. Musculoskeletal: The patient is status post left hip replacement. Degenerative changes are seen in the lumbar spine. IMPRESSION: 1. There is a segment of ileum in the right side of the abdomen which is relatively thick walled as described above. Just proximal to this segmentally thickened loop is at least 30-40 cm of mildly prominent small bowel which contains fecal material and is larger in caliber than the remainder of the small bowel. The segmentally thickened loop of ileum may be secondary to chronic ischemia given the severe atherosclerotic change at the SMA origin. Sequela of inflammatory bowel disease is also possibility. Recommend  consultation with gastroenterology. The segmentally thickened loop results in slowed transit and a relatively low-grade obstruction. 2. Atherosclerotic change in the abdominal aorta and branching vessels. 3. Small bilateral pleural effusions. 4. Probable interstitial lung disease in the lung bases. Electronically Signed   By: Dorise Bullion III M.D   On: 08/04/2020 13:56   ECHOCARDIOGRAM COMPLETE  Result Date: 08/05/2020    ECHOCARDIOGRAM REPORT   Patient Name:   ULRIC SALZMAN Date of Exam: 08/05/2020 Medical Rec #:  [id number]  Height:       68.0 in Accession #:    [id number]          Weight:       220.9 lb Date of Birth:  [birth year]           BSA:          2.132 m Patient Age:    67 years            BP:           135/74 mmHg Patient Gender: M                   HR:           81 bpm. Exam Location:  Inpatient Procedure: 2D Echo and Intracardiac Opacification Agent Indications:    CHF- Acute Diastolic F81.01  History:        Patient has prior history of Echocardiogram examinations, most                 recent 08/12/2018. CAD, Prior CABG, Arrythmias:Atrial                 Fibrillation; Risk Factors:Hypertension, Dyslipidemia and                 Diabetes.  Sonographer:    Mikki Santee RDCS (AE) Referring Phys: [id number] Carson E SEGAL IMPRESSIONS  1. Left ventricular ejection fraction, by estimation, is 55 to 60%. The left ventricle has normal function. The left ventricle has no regional wall motion abnormalities. Left ventricular diastolic parameters are consistent with Grade II diastolic dysfunction (pseudonormalization). Elevated left ventricular end-diastolic pressure.  2. Right ventricular systolic function is normal. The right ventricular size is normal. Tricuspid regurgitation signal is inadequate for assessing PA pressure.  3. Left atrial size was mildly dilated.  4. The mitral valve is normal in structure. Trivial mitral valve regurgitation. No evidence of mitral stenosis. Moderate mitral  annular calcification.  5. The aortic valve was not well visualized. Aortic valve regurgitation is not visualized. Mild aortic valve sclerosis is present, with no evidence of aortic valve stenosis.  6. The inferior vena cava is normal in size with greater than 50% respiratory variability, suggesting right atrial pressure of 3 mmHg. FINDINGS  Left Ventricle: Left ventricular ejection fraction, by estimation, is 55 to 60%. The left ventricle has normal function. The left ventricle has no regional wall motion abnormalities. Definity contrast agent was given IV to delineate the left ventricular  endocardial borders. The left ventricular internal cavity size was normal in size. There is no left ventricular hypertrophy. Left ventricular diastolic parameters are consistent with Grade II diastolic dysfunction (pseudonormalization). Elevated left ventricular end-diastolic pressure. Right Ventricle: The right ventricular size is normal. No increase in right ventricular wall thickness. Right ventricular systolic function is normal. Tricuspid regurgitation signal is inadequate for assessing PA pressure. Left Atrium: Left atrial size was mildly dilated. Right Atrium: Right atrial size was normal in size. Pericardium: There is no evidence of pericardial effusion. Mitral Valve: The mitral valve is normal in structure. Moderate mitral annular calcification. Trivial mitral valve regurgitation. No evidence of mitral valve stenosis. Tricuspid Valve: The tricuspid valve is normal in structure. Tricuspid valve regurgitation is trivial. No evidence of tricuspid stenosis. Aortic Valve: The aortic valve was not well visualized. Aortic valve regurgitation is not visualized. Mild aortic valve sclerosis is present, with no evidence of aortic valve stenosis. Pulmonic Valve: The pulmonic valve was normal in structure. Pulmonic valve regurgitation is  not visualized. No evidence of pulmonic stenosis. Aorta: The aortic root is normal in size and  structure. Venous: The inferior vena cava is normal in size with greater than 50% respiratory variability, suggesting right atrial pressure of 3 mmHg. IAS/Shunts: No atrial level shunt detected by color flow Doppler.  LEFT VENTRICLE PLAX 2D LVIDd:         4.20 cm  Diastology LVIDs:         3.10 cm  LV e' medial:    3.83 cm/s LV PW:         1.00 cm  LV E/e' medial:  34.5 LV IVS:        1.00 cm  LV e' lateral:   7.35 cm/s LVOT diam:     2.40 cm  LV E/e' lateral: 18.0 LV SV:         94 LV SV Index:   44 LVOT Area:     4.52 cm  RIGHT VENTRICLE TAPSE (M-mode): 0.9 cm LEFT ATRIUM              Index       RIGHT ATRIUM           Index LA diam:        3.60 cm  1.69 cm/m  RA Area:     18.30 cm LA Vol (A2C):   108.0 ml 50.66 ml/m RA Volume:   47.70 ml  22.37 ml/m LA Vol (A4C):   50.4 ml  23.64 ml/m LA Biplane Vol: 76.1 ml  35.70 ml/m  AORTIC VALVE LVOT Vmax:   79.20 cm/s LVOT Vmean:  56.000 cm/s LVOT VTI:    0.207 m  AORTA Ao Root diam: 3.50 cm MITRAL VALVE MV Area (PHT): 4.31 cm     SHUNTS MV Decel Time: 176 msec     Systemic VTI:  0.21 m MV E velocity: 132.00 cm/s  Systemic Diam: 2.40 cm MV A velocity: 85.90 cm/s MV E/A ratio:  1.54 Fransico Him MD Electronically signed by Fransico Him MD Signature Date/Time: 08/05/2020/9:54:06 AM    Final         Scheduled Meds: . dextromethorphan-guaiFENesin  1 tablet Oral BID  . enoxaparin (LOVENOX) injection  40 mg Subcutaneous Q24H  . metoprolol tartrate  5 mg Intravenous Q12H  . sodium chloride flush  3 mL Intravenous Q12H   Continuous Infusions:    LOS: 1 day    Time spent: 40 minutes    Irine Seal, MD [facility]   To contact the attending provider between 7A-7P or the covering provider during after hours 7P-7A, please log into the web site [email or web address] and access using universal [place] password for that web site. If you do not have the password, please call the hospital operator.  08/05/2020, 5:21 PM

## 2020-08-06 DIAGNOSIS — I4891 Unspecified atrial fibrillation: Secondary | ICD-10-CM

## 2020-08-06 DIAGNOSIS — I771 Stricture of artery: Secondary | ICD-10-CM

## 2020-08-06 DIAGNOSIS — Z791 Long term (current) use of non-steroidal anti-inflammatories (NSAID): Secondary | ICD-10-CM

## 2020-08-06 DIAGNOSIS — I5031 Acute diastolic (congestive) heart failure: Secondary | ICD-10-CM

## 2020-08-06 DIAGNOSIS — K56609 Unspecified intestinal obstruction, unspecified as to partial versus complete obstruction: Secondary | ICD-10-CM

## 2020-08-06 LAB — GLUCOSE, CAPILLARY
Glucose-Capillary: 137 mg/dL — ABNORMAL HIGH (ref 70–99)
Glucose-Capillary: 199 mg/dL — ABNORMAL HIGH (ref 70–99)
Glucose-Capillary: 175 mg/dL — ABNORMAL HIGH (ref 70–99)
Glucose-Capillary: 193 mg/dL — ABNORMAL HIGH (ref 70–99)

## 2020-08-06 LAB — BASIC METABOLIC PANEL
Anion gap: 10 (ref 5–15)
BUN: 15 mg/dL (ref 8–23)
CO2: 26 mmol/L (ref 22–32)
Calcium: 8.9 mg/dL (ref 8.9–10.3)
Chloride: 100 mmol/L (ref 98–111)
Creatinine, Ser: 1 mg/dL (ref 0.61–1.24)
GFR calc Af Amer: >60 mL/min (ref >60)
GFR calc non Af Amer: >60 mL/min (ref >60)
Glucose, Bld: 142 mg/dL — ABNORMAL HIGH (ref 70–99)
Potassium: 3.6 mmol/L (ref 3.5–5.1)
Sodium: 136 mmol/L (ref 135–145)

## 2020-08-06 LAB — CBC
HCT: 36.6 % — ABNORMAL LOW (ref 39.0–52.0)
Hemoglobin: 11.2 g/dL — ABNORMAL LOW (ref 13.0–17.0)
MCH: 25.5 pg — ABNORMAL LOW (ref 26.0–34.0)
MCHC: 30.6 g/dL (ref 30.0–36.0)
MCV: 83.4 fL (ref 80.0–100.0)
Platelets: 274 10*3/uL (ref 150–400)
RBC: 4.39 MIL/uL (ref 4.22–5.81)
RDW: 15.8 % — ABNORMAL HIGH (ref 11.5–15.5)
WBC: 5.8 10*3/uL (ref 4.0–10.5)
nRBC: 0 % (ref 0.0–0.2)

## 2020-08-06 LAB — HEMOGLOBIN A1C
Hgb A1c MFr Bld: 6.5 % — ABNORMAL HIGH (ref 4.8–5.6)
Mean Plasma Glucose: 139.85 mg/dL

## 2020-08-06 LAB — SEDIMENTATION RATE: Sed Rate: 20 mm/hr — ABNORMAL HIGH (ref 0–16)

## 2020-08-06 LAB — MAGNESIUM: Magnesium: 2.4 mg/dL (ref 1.7–2.4)

## 2020-08-06 MED ORDER — BISACODYL 10 MG RE SUPP
10.0000 mg | Freq: Once | RECTAL | Status: AC
  Administered 2020-08-06: 10 mg via RECTAL
  Filled 2020-08-06: qty 1

## 2020-08-06 MED ORDER — POTASSIUM CHLORIDE 20 MEQ PO PACK
40.0000 meq | PACK | Freq: Once | ORAL | Status: AC
  Administered 2020-08-06: 40 meq via ORAL
  Filled 2020-08-06: qty 2

## 2020-08-06 MED ORDER — GABAPENTIN 100 MG PO CAPS
100.0000 mg | ORAL_CAPSULE | Freq: Three times a day (TID) | ORAL | Status: DC
  Administered 2020-08-06 – 2020-08-07 (×4): 100 mg via ORAL
  Filled 2020-08-06 (×4): qty 1

## 2020-08-06 MED ORDER — ASPIRIN 81 MG PO CHEW
81.0000 mg | CHEWABLE_TABLET | Freq: Every day | ORAL | Status: DC
  Administered 2020-08-07: 81 mg via ORAL
  Filled 2020-08-06: qty 1

## 2020-08-06 MED ORDER — VITAMIN D 25 MCG (1000 UNIT) PO TABS
1000.0000 [IU] | ORAL_TABLET | Freq: Two times a day (BID) | ORAL | Status: DC
  Administered 2020-08-06 – 2020-08-07 (×2): 1000 [IU] via ORAL
  Filled 2020-08-06: qty 1

## 2020-08-06 MED ORDER — GABAPENTIN 100 MG PO CAPS: 100.0000 mg | ORAL_CAPSULE | Freq: Three times a day (TID) | ORAL | Status: DC

## 2020-08-06 MED ORDER — METOPROLOL SUCCINATE ER 25 MG PO TB24
25.0000 mg | ORAL_TABLET | Freq: Every day | ORAL | Status: DC
  Administered 2020-08-07: 25 mg via ORAL
  Filled 2020-08-06: qty 1

## 2020-08-06 MED ORDER — FLUTICASONE PROPIONATE 50 MCG/ACT NA SUSP
2.0000 | Freq: Every day | NASAL | Status: DC
  Administered 2020-08-06 – 2020-08-07 (×2): 2 via NASAL
  Filled 2020-08-06: qty 16

## 2020-08-06 MED ORDER — FUROSEMIDE 40 MG PO TABS
40.0000 mg | ORAL_TABLET | ORAL | Status: DC
  Administered 2020-08-07: 40 mg via ORAL
  Filled 2020-08-06: qty 1

## 2020-08-06 MED ORDER — AMIODARONE HCL 200 MG PO TABS
200.0000 mg | ORAL_TABLET | Freq: Every day | ORAL | Status: DC
  Administered 2020-08-07: 200 mg via ORAL
  Filled 2020-08-06: qty 1

## 2020-08-06 MED ORDER — PANTOPRAZOLE SODIUM 40 MG PO TBEC
40.0000 mg | DELAYED_RELEASE_TABLET | Freq: Every day | ORAL | Status: DC
  Administered 2020-08-07: 40 mg via ORAL

## 2020-08-06 MED ORDER — LATANOPROST 0.005 % OP SOLN
1.0000 [drp] | Freq: Every day | OPHTHALMIC | Status: DC
  Administered 2020-08-06: 1 [drp] via OPHTHALMIC
  Filled 2020-08-06: qty 2.5

## 2020-08-06 MED ORDER — TRAMADOL HCL 50 MG PO TABS
50.0000 mg | ORAL_TABLET | Freq: Four times a day (QID) | ORAL | Status: DC | PRN
  Administered 2020-08-06: 50 mg via ORAL
  Filled 2020-08-06: qty 1

## 2020-08-06 NOTE — Progress Notes (Signed)
SATURATION QUALIFICATIONS: (This note is used to comply with regulatory documentation for home oxygen)  Patient Saturations on Room Air at Rest = 93%  Patient Saturations on Room Air while Ambulating = 90%   Pt denied any shortness of breath while ambulating.

## 2020-08-06 NOTE — Progress Notes (Addendum)
JT:[id number] pain  Subjective:  Pt complains of his nose being dry and congested, his mouth being dry, his back hurting, but no abdominal pain.  He is tolerating clears but has not had a bowel movement.    He is also being kept in bed.  Objective: Vital signs in last 24 hours: Temp:  [98.1 F (36.7 C)-98.4 F (36.9 C)] 98.1 F (36.7 C) (09/21 0556) Pulse Rate:  [71-76] 71 (09/21 0556) Resp:  [18-21] 20 (09/21 0556) BP: (93-155)/(51-80) 93/51 (09/21 0556) SpO2:  [94 %-100 %] 95 % (09/21 0556) Weight:  [102.2 kg] 102.2 kg (09/20 1101) Last BM Date: 08/03/20 Nothing p.o. recorded 1650 urine No BM recorded Afebrile vital signs are stable. A.m. labs: BMP is normal except for glucose of 142, WBC 5.8, H/H 11.2/36.6 Platelets 274,000 Hemoglobin A1c 6.5  Intake/Output from previous day: 09/20 0701 - 09/21 0700 In: -  Out: 3009 [Urine:1650] Intake/Output this shift: No intake/output data recorded.  General appearance: alert, cooperative and no distress GI: soft, non-tender; bowel sounds normal; no masses,  no organomegaly, positive flatus, NO BM so far.  Lab Results:  Recent Labs    08/05/20 0552 08/06/20 0531  WBC 6.8 5.8  HGB 10.7* 11.2*  HCT 35.7* 36.6*  PLT 272 274    BMET Recent Labs    08/05/20 0552 08/06/20 0531  NA 140 136  K 3.5 3.6  CL 101 100  CO2 26 26  GLUCOSE 137* 142*  BUN 15 15  CREATININE 0.94 1.00  CALCIUM 9.0 8.9   PT/INR No results for input(s): LABPROT, INR in the last 72 hours.  Recent Labs  Lab 08/04/20 1118 08/05/20 0552  AST 27 25  ALT 24 22  ALKPHOS 52 44  BILITOT 1.1 0.8  PROT 8.3* 7.1  ALBUMIN 4.5 3.8     Lipase     Component Value Date/Time   LIPASE 28 08/04/2020 1118     Medications: . dextromethorphan-guaiFENesin  1 tablet Oral BID  . enoxaparin (LOVENOX) injection  40 mg Subcutaneous Q24H  . furosemide  20 mg Intravenous Q12H  . insulin aspart  0-9 Units Subcutaneous TID WC  . metoprolol tartrate  5  mg Intravenous Q12H  . 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     Improving appearance of small bowel without evidence for small bowel obstruction.  There are a few persistent areas of small bowel wall thickening which may be secondary to enteritis.  Ischemic enteritis seem less likely with good collateral blood supply at this level in addition to patent celiac axis and IMA. FEN: Clear liquids ID: None DVT: Lovenox Follow-up: TBD  Plan: There is no evidence for small bowel obstruction noted on the CT yesterday there are few loops in the mid abdomen demonstrating some circumferential wall thickening.    He is tolerating clear liquids well.  He reported yesterday he can go 2 to 3 days without a bowel movement as his baseline.  Ongoing to place him on full liquids and give him a Dulcolax suppository this AM.    Also recommend mobilizing. Defer further recommendations to GI.   LOS: 2 days   JENNINGS,WILLARD 08/06/2020 Please see Amion  Agree with above. No acute surgical issues.  Alphonsa Overall, MD, [facility] Office phone:  [phone]

## 2020-08-06 NOTE — Progress Notes (Signed)
Patient ID: [name], male   DOB: Jul 06, 1935, 85 y.o.   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  He has been passing gas but no bowel movement as yet. Asking about going home and also asking about diet for home.    Objective   Vital signs in last 24 hours: Temp:  [98.1 F (36.7 C)-98.4 F (36.9 C)] 98.1 F (36.7 C) (09/21 0556) Pulse Rate:  [71-76] 71 (09/21 0556) Resp:  [18-21] 20 (09/21 0556) BP: (93-155)/(51-80) 93/51 (09/21 0556) SpO2:  [94 %-100 %] 95 % (09/21 0556) Weight:  [102.2 kg] 102.2 kg (09/20 1101) Last BM Date: 08/03/20 General:    eld WM in NAD Heart:  Regular rate and rhythm; no murmurs Lungs: Respirations even and unlabored, lungs CTA bilaterally Abdomen:  Soft, obese no focal tenderness , nondistended. Normal bowel sounds. Extremities:  Without edema. Neurologic:  Alert and oriented,  grossly normal neurologically. Psych:  Cooperative. Normal mood and affect.  Intake/Output from previous day: 09/20 0701 - 09/21 0700 In: -  Out: 1761 [Urine:1650] Intake/Output this shift: No intake/output data recorded.  Lab Results: Recent Labs    08/04/20 1118 08/05/20 0552 08/06/20 0531  WBC 8.0 6.8 5.8  HGB 11.0* 10.7* 11.2*  HCT 35.6* 35.7* 36.6*  PLT 283 272 274   BMET Recent Labs    08/04/20 1118 08/05/20 0552 08/06/20 0531  NA 137 140 136  K 3.9 3.5 3.6  CL 103 101 100  CO2 21* 26 26  GLUCOSE 169* 137* 142*  BUN 15 15 15   CREATININE 0.86 0.94 1.00  CALCIUM 9.1 9.0 8.9   LFT Recent Labs    08/05/20 0552  PROT 7.1  ALBUMIN 3.8    AST 25  ALT 22  ALKPHOS 44  BILITOT 0.8   PT/INR No results for input(s): LABPROT, INR in the last 72 hours.  Studies/Results: DG Abd 1 View  Result Date: 08/05/2020 CLINICAL DATA:  Follow-up small bowel obstruction EXAM: ABDOMEN - 1 VIEW COMPARISON:  Yesterday FINDINGS: Negative for obstructive pattern or over distended bowel. No concerning mass effect or gas collection. Moderate distension of the bladder by contrast. Left hip arthroplasty. IMPRESSION: No gas dilated bowel. Moderate distension of the bladder. Electronically Signed   By: Monte Fantasia M.D.   On: 08/05/2020 07:43   DG Abdomen 1 View  Result Date: 08/04/2020 CLINICAL DATA:  NG tube placement EXAM: ABDOMEN - 1 VIEW COMPARISON:  None. FINDINGS: Esophagogastric tube is positioned with tip and side-port at the level of the gastropathy or junction. Recommend slight advancement to ensure subdiaphragmatic placement of tip and side port. Gas-filled, although nondistended bowel throughout the included abdomen and pelvis. IMPRESSION: Esophagogastric tube is positioned with tip and side-port at the level of the gastropathy or junction. Recommend slight advancement to ensure subdiaphragmatic placement of tip and side port. Electronically Signed   By: Eddie Candle M.D.   On: 08/04/2020 16:40   CT Abdomen Pelvis W Contrast  Result Date: 08/04/2020 CLINICAL DATA:  Bowel obstruction suspected.  Pain and swelling. EXAM: CT ABDOMEN AND PELVIS  WITH CONTRAST TECHNIQUE: Multidetector CT imaging of the abdomen and pelvis was performed using the standard protocol following bolus administration of intravenous contrast. CONTRAST:  115mL OMNIPAQUE IOHEXOL 300 MG/ML  SOLN COMPARISON:  January 29, 2019 FINDINGS: Lower chest: Small bilateral pleural effusions are identified, right greater than left. No nodules or masses. Reticular changes in the periphery of the lung were also seen in March of 2020, likely interstitial lung disease. No evidence of pneumonia.  No other abnormalities in the lower chest. Hepatobiliary: No focal liver abnormality is seen. No gallstones, gallbladder wall thickening, or biliary dilatation. Pancreas: Unremarkable. No pancreatic ductal dilatation or surrounding inflammatory changes. Spleen: Normal in size without focal abnormality. Adrenals/Urinary Tract: Adrenal glands are unremarkable. Kidneys are normal, without renal calculi, focal lesion, or hydronephrosis. Bladder is unremarkable. Stomach/Bowel: The stomach is normal. The proximal small bowel is normal. There is a segment of ileum with wall thickening best seen on coronal images 37 through 47 in the right side of the abdomen. There is fecal material within this segmentally thickened loop of bowel. There is a relative transition point associated on coronal image 34. Proximal to the transition point is a mildly prominent loop of small bowel containing fecal material. Small bowel remains mildly prominent over at least 30 cm proximal to the transition point. The colon and appendix are normal. Vascular/Lymphatic: Calcified atherosclerosis is seen in the abdominal aorta and at the origins of the branching vessels. There is dense atherosclerosis at the origin of the SMA in particular. Atherosclerotic change extends into the iliac and femoral vessels. No adenopathy. Reproductive: Prostate is unremarkable. Other: No abdominal wall hernia or abnormality. No abdominopelvic ascites. Musculoskeletal: The patient is status post left hip replacement. Degenerative changes are seen in the lumbar spine. IMPRESSION: 1. There is a segment of ileum in the right side of the abdomen which is relatively thick walled as described above. Just proximal to this segmentally thickened loop is at least 30-40 cm of mildly prominent small bowel which contains fecal material and is larger in caliber than the remainder of the small bowel. The segmentally thickened loop of ileum may be secondary to chronic ischemia given the  severe atherosclerotic change at the SMA origin. Sequela of inflammatory bowel disease is also possibility. Recommend consultation with gastroenterology. The segmentally thickened loop results in slowed transit and a relatively low-grade obstruction. 2. Atherosclerotic change in the abdominal aorta and branching vessels. 3. Small bilateral pleural effusions. 4. Probable interstitial lung disease in the lung bases. Electronically Signed   By: Dorise Bullion III M.D   On: 08/04/2020 13:56   ECHOCARDIOGRAM COMPLETE  Result Date: 08/05/2020    ECHOCARDIOGRAM REPORT   Patient Name:   [name] Date of Exam: 08/05/2020 Medical Rec #:  [id number]           Height:       68.0 in Accession #:    [id number]          Weight:       220.9 lb Date of Birth:  1935/08/24           BSA:          2.132 m Patient Age:    55 years            BP:           135/74 mmHg Patient Gender: M                   HR:  81 bpm. Exam Location:  Inpatient Procedure: 2D Echo and Intracardiac Opacification Agent Indications:    CHF- Acute Diastolic X10.62  History:        Patient has prior history of Echocardiogram examinations, most                 recent 08/12/2018. CAD, Prior CABG, Arrythmias:Atrial                 Fibrillation; Risk Factors:Hypertension, Dyslipidemia and                 Diabetes.  Sonographer:    Mikki Santee RDCS (AE) Referring Phys: [id number] Porter E SEGAL IMPRESSIONS  1. Left ventricular ejection fraction, by estimation, is 55 to 60%. The left ventricle has normal function. The left ventricle has no regional wall motion abnormalities. Left ventricular diastolic parameters are consistent with Grade II diastolic dysfunction (pseudonormalization). Elevated left ventricular end-diastolic pressure.  2. Right ventricular systolic function is normal. The right ventricular size is normal. Tricuspid regurgitation signal is inadequate for assessing PA pressure.  3. Left atrial size was mildly dilated.  4. The  mitral valve is normal in structure. Trivial mitral valve regurgitation. No evidence of mitral stenosis. Moderate mitral annular calcification.  5. The aortic valve was not well visualized. Aortic valve regurgitation is not visualized. Mild aortic valve sclerosis is present, with no evidence of aortic valve stenosis.  6. The inferior vena cava is normal in size with greater than 50% respiratory variability, suggesting right atrial pressure of 3 mmHg. FINDINGS  Left Ventricle: Left ventricular ejection fraction, by estimation, is 55 to 60%. The left ventricle has normal function. The left ventricle has no regional wall motion abnormalities. Definity contrast agent was given IV to delineate the left ventricular  endocardial borders. The left ventricular internal cavity size was normal in size. There is no left ventricular hypertrophy. Left ventricular diastolic parameters are consistent with Grade II diastolic dysfunction (pseudonormalization). Elevated left ventricular end-diastolic pressure. Right Ventricle: The right ventricular size is normal. No increase in right ventricular wall thickness. Right ventricular systolic function is normal. Tricuspid regurgitation signal is inadequate for assessing PA pressure. Left Atrium: Left atrial size was mildly dilated. Right Atrium: Right atrial size was normal in size. Pericardium: There is no evidence of pericardial effusion. Mitral Valve: The mitral valve is normal in structure. Moderate mitral annular calcification. Trivial mitral valve regurgitation. No evidence of mitral valve stenosis. Tricuspid Valve: The tricuspid valve is normal in structure. Tricuspid valve regurgitation is trivial. No evidence of tricuspid stenosis. Aortic Valve: The aortic valve was not well visualized. Aortic valve regurgitation is not visualized. Mild aortic valve sclerosis is present, with no evidence of aortic valve stenosis. Pulmonic Valve: The pulmonic valve was normal in structure.  Pulmonic valve regurgitation is not visualized. No evidence of pulmonic stenosis. Aorta: The aortic root is normal in size and structure. Venous: The inferior vena cava is normal in size with greater than 50% respiratory variability, suggesting right atrial pressure of 3 mmHg. IAS/Shunts: No atrial level shunt detected by color flow Doppler.  LEFT VENTRICLE PLAX 2D LVIDd:         4.20 cm  Diastology LVIDs:         3.10 cm  LV e' medial:    3.83 cm/s LV PW:         1.00 cm  LV E/e' medial:  34.5 LV IVS:        1.00 cm  LV e' lateral:  7.35 cm/s LVOT diam:     2.40 cm  LV E/e' lateral: 18.0 LV SV:         94 LV SV Index:   44 LVOT Area:     4.52 cm  RIGHT VENTRICLE TAPSE (M-mode): 0.9 cm LEFT ATRIUM              Index       RIGHT ATRIUM           Index LA diam:        3.60 cm  1.69 cm/m  RA Area:     18.30 cm LA Vol (A2C):   108.0 ml 50.66 ml/m RA Volume:   47.70 ml  22.37 ml/m LA Vol (A4C):   50.4 ml  23.64 ml/m LA Biplane Vol: 76.1 ml  35.70 ml/m  AORTIC VALVE LVOT Vmax:   79.20 cm/s LVOT Vmean:  56.000 cm/s LVOT VTI:    0.207 m  AORTA Ao Root diam: 3.50 cm MITRAL VALVE MV Area (PHT): 4.31 cm     SHUNTS MV Decel Time: 176 msec     Systemic VTI:  0.21 m MV E velocity: 132.00 cm/s  Systemic Diam: 2.40 cm MV A velocity: 85.90 cm/s MV E/A ratio:  1.54 Fransico Him MD Electronically signed by Fransico Him MD Signature Date/Time: 08/05/2020/9:54:06 AM    Final    CT Angio Abd/Pel w/ and/or w/o  Result Date: 08/05/2020 CLINICAL DATA:  Recurrent small-bowel obstruction.  SMA stenosis. EXAM: CTA ABDOMEN AND PELVIS WITHOUT AND WITH CONTRAST TECHNIQUE: Multidetector CT imaging of the abdomen and pelvis was performed using the standard protocol during bolus administration of intravenous contrast. Multiplanar reconstructed images and MIPs were obtained and reviewed to evaluate the vascular anatomy. CONTRAST:  124mL OMNIPAQUE IOHEXOL 350 MG/ML SOLN COMPARISON:  08/04/2020 FINDINGS: VASCULAR Aorta: There are  atherosclerotic changes throughout the abdominal aorta without evidence for dissection or aneurysm. Celiac: There is mild narrowing at the origin of the celiac axis. SMA: There is a replaced right hepatic artery arising from the SMA. 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 The remaining portions of the SMA are unremarkable. Renals: There are atherosclerotic changes at the origins of both renal arteries resulting in mild stenosis. IMA: Patent without evidence of aneurysm, dissection, vasculitis or significant stenosis. Inflow: Patent without evidence of aneurysm, dissection, vasculitis or significant stenosis. Proximal Outflow: Bilateral common femoral and visualized portions of the superficial and profunda femoral arteries are patent without evidence of aneurysm, dissection, vasculitis or significant stenosis. Veins: No obvious venous abnormality within the limitations of this arterial phase study. Review of the MIP images confirms the above findings. NON-VASCULAR Lower chest: There are trace bilateral pleural effusions, right greater than left.The heart size is normal. Hepatobiliary: The liver is normal. Normal gallbladder.There is no biliary ductal dilation. Pancreas: Normal contours without ductal dilatation. No peripancreatic fluid collection. Spleen: Unremarkable. Adrenals/Urinary Tract: --Adrenal glands: Unremarkable. --Right kidney/ureter: No hydronephrosis or radiopaque kidney stones. --Left kidney/ureter: No hydronephrosis or radiopaque kidney stones. --Urinary bladder: Unremarkable. Stomach/Bowel: --Stomach/Duodenum: No hiatal hernia or other gastric abnormality. Normal duodenal course and caliber. --Small bowel: There is no evidence for small bowel obstruction. Again noted are few loops of small bowel in the mid abdomen that demonstrate circumferential wall thickening. --Colon: Unremarkable. --Appendix: Normal. Lymphatic: --No  retroperitoneal lymphadenopathy. --No mesenteric lymphadenopathy. --No pelvic or inguinal lymphadenopathy. Reproductive: Unremarkable Other: No ascites or free air. There are fat containing  bilateral inguinal hernias, left greater than right. Musculoskeletal. No acute displaced fractures. IMPRESSION: 1. 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 2. Mild narrowing at the origin of the celiac axis. 3. Improving appearance of the small bowel without evidence for small bowel obstruction. There are few persistent areas of small-bowel wall thickening which may be secondary to an enteritis. Ischemic enteritis seems less likely given the robust collateral blood supply at this level in addition to a patent celiac axis and IMA. 4. Trace bilateral pleural effusions, right greater than left. 5. Fat containing bilateral inguinal hernias, left greater than right. Aortic Atherosclerosis (ICD10-I70.0). Electronically Signed   By: Constance Holster M.D.   On: 08/05/2020 17:31       Assessment / Plan:    #21 85 year old white male with history of recurrent partial small bowel obstructions, admitted for same.  Low-grade obstruction has resolved on imaging and abdominal pain has resolved as well.  Etiology of these recurrent obstructions is not clear, patient has not had any prior abdominal surgery. CT angio yesterday shows significant stenosis at the origin of the SMA, however has collaterals and celiac and IMA patent, therefore per radiology unlikely to be ischemic in etiology.  Consider spigelian/internal hernia, doubt IBD but cannot rule out, rule out other enteritis  Plan; advance to soft low roughage diet today, if he tolerates breakfast and lunch, may be able to be discharged home later today Low roughage diet at home over the next 1 to 2 weeks Sed rate today I think he needs further work-up which may be able to be done as an outpatient, will discuss MR enterography versus CT  enterography, and also consider capsule endoscopy. We will arrange for outpatient follow-up with Dr. Fuller Plan.   Principal Problem:   SBO (small bowel obstruction) (HCC) Active Problems:   Diabetes mellitus, type 2 (HCC)   Paroxysmal atrial fibrillation (HCC)   Abdominal pain   Acute respiratory failure with hypoxia (HCC)   Acute diastolic CHF (congestive heart failure) ([facility])   Essential hypertension     LOS: 2 days   Gregorey Nabor  08/06/2020, 8:34 AM

## 2020-08-06 NOTE — Progress Notes (Signed)
PROGRESS NOTE    [name]  NID:[id number] DOB: 1935-10-18 DOA: 08/04/2020 PCP: Cassandria Anger, MD    Chief Complaint  Patient presents with  . Shortness of Breath  . Abdominal Pain    Brief Narrative:  HPI per Dr. Neysa Bonito This is an 85 year old male with past medical history of diabetes, hypertension, hyperlipidemia, CAD s/p CABG, atrial fibrillation on Eliquis, partial SBO, ILD, COVID-19 (December 2020) HFpEF (last EF 40 to 45% in 2019) who presented to the ED with complaints of gradual onset, constant waxing and waning periumbilical abdominal pain x12 hours with associated nausea similar to prior SBO.  Reportedly had a normal BM yesterday.  Still passing gas feels as though the abdominal pain is making him short of breath.  He is supposed to take Lasix 3 times a week however frequently forgets.  Of note he had COVID-19 in December 2020 and has been having shortness of breath since that time however has been worsening recently and was sent to a pulmonologist.  Had a CT chest on 7/22 which showed fibrotic ILD with slight basilar predominance categorized as probable UIP.  Had a pulmonology visit with Dr. Lamonte Sakai on 9/15 who felt this was probably from his COVID-19 and had PFTs at the time which showed some restriction with superimposed obstruction and was prescribed albuterol.  ED Course: Afebrile, hypertensive and tachypneic with SPO2 87% on room air requiring 2 L.  Labs: CO2 21, glucose 169, BNP 475, troponin 21-> 25, WBC 8.0, Hb 11.0, UA with ketones.  CXR with findings suggestive of mild CHF.  CT abdomen pelvis: Findings suggestive of chronic ischemia VS.  Sequelae of inflammatory bowel disease and relatively low-grade obstruction.  ED PA discussed with Dr. Silverio Decamp, GI will see in a.m. and Dr. Marlou Starks, general surgery, who will evaluate the patient.    Assessment & Plan:   Principal Problem:   SBO (small bowel obstruction) (HCC) Active Problems:   Diabetes mellitus, type  2 (HCC)   Paroxysmal atrial fibrillation (HCC)   Abdominal pain   Acute respiratory failure with hypoxia (HCC)   Acute diastolic CHF (congestive heart failure) (HCC)   Essential hypertension   1 periumbilical pain, suspected partial small bowel obstruction with history of small bowel obstruction Patient presented with abdominal pain.  Last bowel movement was 2 days ago.  Afebrile.  Hemodynamically stable.  NG tube was placed however during my assessment patient with no NG tube.  CT abdomen and pelvis done with thickening of right-sided colon and findings suggestive of chronic ischemia as well as low-grade obstruction.  Patient passing flatus.  No bowel movement.  States improvement with abdominal pain.  General surgery and GI consulted.  Patient seen by general surgery small bowel protocol if GI agreeable.  Patient seen by GI and patient started on clears and diet advanced to a full liquid diet and subsequently a soft diet..  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  Few persistent areas of small bowel wall thickening which may be secondary to enteritis.  Ischemic enteritis seems less likely with good collateral blood supply at this level in addition to patent celiac axis and IMA.  GI recommended vascular surgery evaluation.  Vascular surgery has assessed the patient and does not think patient symptoms  can be attributed to chronic mesenteric ischemia or acute mesenteric ischemia.  No further vascular work-up indicated at this time per vascular surgery.  Patient to continue aspirin and statin.  Per GI /general surgery.  2.  Acute hypoxic respiratory failure in the setting of ILD and suspected mild acute diastolic CHF exacerbation, possibly  from missing occasional Lasix doses Patient noted to be on room air at baseline.  On admission was requiring 2 L nasal cannula.  2D echo with a EF of 55 to 60%, no wall motion abnormality, grade 2 diastolic dysfunction, elevated left ventricular end-diastolic pressure, mildly dilated left atrial size.  Patient received a dose of Lasix 40 mg IV x1 in the ED.  Urine output not properly recorded.  Patient started on Lasix 20 mg IV every 12 hours with urine output of 1.650 L over the past 24 hours.  Continue Lasix 20 mg IV every 12 hours through today and discontinue after today's doses.  Strict I's and O's.  Daily weights.  Could likely resume home dose oral Lasix tomorrow if stable.  Resume home dose Toprol-XL tomorrow.  Supportive care.   3.  Paroxysmal atrial fibrillation CHA2DS2VASC score at least 5 (age, hypertension, vascular disease, diabetes).  Patient noted to be on Toprol-XL, amiodarone, Eliquis prior to admission.  As patient was n.p.o. patient was placed on Lopressor 5 mg IV every 12 hours.  Discontinue IV Lopressor due to borderline blood pressure.  Resume Toprol-XL in the morning.  Resume amiodarone in the morning.  Eliquis on hold in case patient may need procedures.  General surgery/GI to advise when anticoagulation may be resumed.   4.  Hypertension Blood pressure noted to be borderline.  Discontinue IV Lopressor.  Currently on IV Lasix every 12 hours.  Could resume Toprol-XL tomorrow morning if blood pressure remained stable.  5.  Hyperlipidemia Statin has been held and will resume on discharge.  6.  History of ILD Continue albuterol inhaler.  Outpatient follow-up with pulmonary.  7.  Diabetes mellitus type 2 Last hemoglobin A1c 6.5 (11/04/2019).  CBG of 137 this morning.  Repeat hemoglobin A1c 6.5 (08/06/2020).  Sliding scale insulin.      DVT prophylaxis: Lovenox Code Status: Partial Family Communication: Updated patient.  No family at bedside. Disposition:   Status is:  Inpatient    Dispo: The patient is from: Home              Anticipated d/c is to: Home              Anticipated d/c date is: In 1 to 2 days.              Patient currently being assessed by general surgery and gastroenterology and also has been assessed by vascular surgery.  Diet has been advanced.  Not stable for discharge.       Consultants:   General surgery: Dr. Marlou Starks 08/04/2020  Gastroenterology: Dr. Fuller Plan 08/05/2020  Vascular surgery: Dr. Scot Dock 08/06/2020  Procedures:  CT abdomen and pelvis 08/04/2020  CT angiogram abdomen and pelvis 08/05/2020  Abdominal films 08/04/2020, 08/05/2020  2D echo 08/05/2020  Antimicrobials:   None   Subjective: Patient sitting up in chair eating lunch.  Noted to have tolerated clears and full liquid diet and diet has been advanced.  No bowel movement.  Passing flatus.  Objective: Vitals:   08/06/20 0556 08/06/20 0947 08/06/20 1429 08/06/20 1658  BP: (!) 93/51 (!) 128/56 134/63 (!) 146/71  Pulse: 71  82 74  Resp:  20  16   Temp: 98.1 F (36.7 C)     TempSrc: Oral     SpO2: 95%  91%   Weight:      Height:        Intake/Output Summary (Last 24 hours) at 08/06/2020 2100 Last data filed at 08/06/2020 1900 Gross per 24 hour  Intake 720 ml  Output 650 ml  Net 70 ml   Filed Weights   08/04/20 0723 08/05/20 0500 08/05/20 1101  Weight: 104.3 kg 100.2 kg 102.2 kg    Examination:  General exam: NAD Respiratory system: Some decreased crackles in the bases otherwise clear.  No wheezing.  Fair air movement.  Speaking in full sentences.  Cardiovascular system: Regular rate and rhythm no murmurs rubs or gallops.  No JVD.  No lower extremity edema.  Gastrointestinal system: Abdomen is soft, nontender, nondistended, positive bowel sounds.  No rebound.  No guarding.  Central nervous system: Alert and oriented. No focal neurological deficits. Extremities: Symmetric 5 x 5 power. Skin: No rashes, lesions or ulcers Psychiatry: Judgement and  insight appear normal. Mood & affect appropriate.     Data Reviewed: I have personally reviewed following labs and imaging studies  CBC: Recent Labs  Lab 08/04/20 1118 08/05/20 0552 08/06/20 0531  WBC 8.0 6.8 5.8  NEUTROABS 6.5  --   --   HGB 11.0* 10.7* 11.2*  HCT 35.6* 35.7* 36.6*  MCV 82.4 83.2 83.4  PLT 283 272 026    Basic Metabolic Panel: Recent Labs  Lab 08/04/20 1118 08/05/20 0552 08/06/20 0531  NA 137 140 136  K 3.9 3.5 3.6  CL 103 101 100  CO2 21* 26 26  GLUCOSE 169* 137* 142*  BUN 15 15 15   CREATININE 0.86 0.94 1.00  CALCIUM 9.1 9.0 8.9  MG  --  2.3 2.4    GFR: Estimated Creatinine Clearance: 64.8 mL/min (by C-G formula based on SCr of 1 mg/dL).  Liver Function Tests: Recent Labs  Lab 08/04/20 1118 08/05/20 0552  AST 27 25  ALT 24 22  ALKPHOS 52 44  BILITOT 1.1 0.8  PROT 8.3* 7.1  ALBUMIN 4.5 3.8    CBG: Recent Labs  Lab 08/05/20 1832 08/05/20 2215 08/06/20 0745 08/06/20 1152 08/06/20 1638  GLUCAP 120* 124* 137* 199* 175*     Recent Results (from the past 240 hour(s))  SARS Coronavirus 2 by RT PCR (hospital order, performed in [facility] hospital lab) Nasopharyngeal Nasopharyngeal Swab     Status: None   Collection Time: 08/04/20 12:51 PM   Specimen: Nasopharyngeal Swab  Result Value Ref Range Status   SARS Coronavirus 2 NEGATIVE NEGATIVE Final    Comment: (NOTE) SARS-CoV-2 target nucleic acids are NOT DETECTED.  The SARS-CoV-2 RNA is generally detectable in upper and lower respiratory specimens during the acute phase of infection. The lowest concentration of SARS-CoV-2 viral copies this assay can detect is 250 copies / mL. A negative result does not preclude SARS-CoV-2 infection and should not be used as the sole basis for treatment or other patient management decisions.  A negative result may occur with improper specimen collection / handling, submission of specimen other than nasopharyngeal swab, presence of viral  mutation(s) within the areas targeted by this assay, and inadequate number of viral copies (<250 copies / mL). A negative result must be combined with clinical observations, patient history, and epidemiological information.  Fact Sheet for Patients:   StrictlyIdeas.no  Fact Sheet for Healthcare Providers: BankingDealers.co.za  This test is  not yet approved or  cleared by the [place] and has been authorized for detection and/or diagnosis of SARS-CoV-2 by FDA under an Emergency Use Authorization (EUA).  This EUA will remain in effect (meaning this test can be used) for the duration of the COVID-19 declaration under Section 564(b)(1) of the Act, 21 U.S.C. section 360bbb-3(b)(1), unless the authorization is terminated or revoked sooner.  Performed at [facility], [place] [address], [place], [place] [address]          Radiology Studies: DG Abd 1 View  Result Date: 08/05/2020 CLINICAL DATA:  Follow-up small bowel obstruction EXAM: ABDOMEN - 1 VIEW COMPARISON:  Yesterday FINDINGS: Negative for obstructive pattern or over distended bowel. No concerning mass effect or gas collection. Moderate distension of the bladder by contrast. Left hip arthroplasty. IMPRESSION: No gas dilated bowel. Moderate distension of the bladder. Electronically Signed   By: Monte Fantasia M.D.   On: 08/05/2020 07:43   ECHOCARDIOGRAM COMPLETE  Result Date: 08/05/2020    ECHOCARDIOGRAM REPORT   Patient Name:   [name] Date of Exam: 08/05/2020 Medical Rec #:  [id number]           Height:       68.0 in Accession #:    [id number]          Weight:       220.9 lb Date of Birth:  September 24, 1935           BSA:          2.132 m Patient Age:    89 years            BP:           135/74 mmHg Patient Gender: M                   HR:           81 bpm. Exam Location:  Inpatient Procedure: 2D Echo and Intracardiac Opacification Agent Indications:    CHF-  Acute Diastolic X79.39  History:        Patient has prior history of Echocardiogram examinations, most                 recent 08/12/2018. CAD, Prior CABG, Arrythmias:Atrial                 Fibrillation; Risk Factors:Hypertension, Dyslipidemia and                 Diabetes.  Sonographer:    Mikki Santee RDCS (AE) Referring Phys: [id number] Moore E SEGAL IMPRESSIONS  1. Left ventricular ejection fraction, by estimation, is 55 to 60%. The left ventricle has normal function. The left ventricle has no regional wall motion abnormalities. Left ventricular diastolic parameters are consistent with Grade II diastolic dysfunction (pseudonormalization). Elevated left ventricular end-diastolic pressure.  2. Right ventricular systolic function is normal. The right ventricular size is normal. Tricuspid regurgitation signal is inadequate for assessing PA pressure.  3. Left atrial size was mildly dilated.  4. The mitral valve is normal in structure. Trivial mitral valve regurgitation. No evidence of mitral stenosis. Moderate mitral annular calcification.  5. The aortic valve was not well visualized. Aortic valve regurgitation is not visualized. Mild aortic valve sclerosis is present, with no evidence of aortic valve stenosis.  6. The inferior vena cava is normal in size with greater than 50% respiratory variability, suggesting right atrial pressure of 3 mmHg. FINDINGS  Left Ventricle: Left ventricular ejection fraction,  by estimation, is 55 to 60%. The left ventricle has normal function. The left ventricle has no regional wall motion abnormalities. Definity contrast agent was given IV to delineate the left ventricular  endocardial borders. The left ventricular internal cavity size was normal in size. There is no left ventricular hypertrophy. Left ventricular diastolic parameters are consistent with Grade II diastolic dysfunction (pseudonormalization). Elevated left ventricular end-diastolic pressure. Right Ventricle: The right  ventricular size is normal. No increase in right ventricular wall thickness. Right ventricular systolic function is normal. Tricuspid regurgitation signal is inadequate for assessing PA pressure. Left Atrium: Left atrial size was mildly dilated. Right Atrium: Right atrial size was normal in size. Pericardium: There is no evidence of pericardial effusion. Mitral Valve: The mitral valve is normal in structure. Moderate mitral annular calcification. Trivial mitral valve regurgitation. No evidence of mitral valve stenosis. Tricuspid Valve: The tricuspid valve is normal in structure. Tricuspid valve regurgitation is trivial. No evidence of tricuspid stenosis. Aortic Valve: The aortic valve was not well visualized. Aortic valve regurgitation is not visualized. Mild aortic valve sclerosis is present, with no evidence of aortic valve stenosis. Pulmonic Valve: The pulmonic valve was normal in structure. Pulmonic valve regurgitation is not visualized. No evidence of pulmonic stenosis. Aorta: The aortic root is normal in size and structure. Venous: The inferior vena cava is normal in size with greater than 50% respiratory variability, suggesting right atrial pressure of 3 mmHg. IAS/Shunts: No atrial level shunt detected by color flow Doppler.  LEFT VENTRICLE PLAX 2D LVIDd:         4.20 cm  Diastology LVIDs:         3.10 cm  LV e' medial:    3.83 cm/s LV PW:         1.00 cm  LV E/e' medial:  34.5 LV IVS:        1.00 cm  LV e' lateral:   7.35 cm/s LVOT diam:     2.40 cm  LV E/e' lateral: 18.0 LV SV:         94 LV SV Index:   44 LVOT Area:     4.52 cm  RIGHT VENTRICLE TAPSE (M-mode): 0.9 cm LEFT ATRIUM              Index       RIGHT ATRIUM           Index LA diam:        3.60 cm  1.69 cm/m  RA Area:     18.30 cm LA Vol (A2C):   108.0 ml 50.66 ml/m RA Volume:   47.70 ml  22.37 ml/m LA Vol (A4C):   50.4 ml  23.64 ml/m LA Biplane Vol: 76.1 ml  35.70 ml/m  AORTIC VALVE LVOT Vmax:   79.20 cm/s LVOT Vmean:  56.000 cm/s LVOT VTI:     0.207 m  AORTA Ao Root diam: 3.50 cm MITRAL VALVE MV Area (PHT): 4.31 cm     SHUNTS MV Decel Time: 176 msec     Systemic VTI:  0.21 m MV E velocity: 132.00 cm/s  Systemic Diam: 2.40 cm MV A velocity: 85.90 cm/s MV E/A ratio:  1.54 Fransico Him MD Electronically signed by Fransico Him MD Signature Date/Time: 08/05/2020/9:54:06 AM    Final    CT Angio Abd/Pel w/ and/or w/o  Result Date: 08/05/2020 CLINICAL DATA:  Recurrent small-bowel obstruction.  SMA stenosis. EXAM: CTA ABDOMEN AND PELVIS WITHOUT AND WITH CONTRAST TECHNIQUE: Multidetector CT imaging of the abdomen  and pelvis was performed using the standard protocol during bolus administration of intravenous contrast. Multiplanar reconstructed images and MIPs were obtained and reviewed to evaluate the vascular anatomy. CONTRAST:  140mL OMNIPAQUE IOHEXOL 350 MG/ML SOLN COMPARISON:  08/04/2020 FINDINGS: VASCULAR Aorta: There are atherosclerotic changes throughout the abdominal aorta without evidence for dissection or aneurysm. Celiac: There is mild narrowing at the origin of the celiac axis. SMA: There is a replaced right hepatic artery arising from the SMA. 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 The remaining portions of the SMA are unremarkable. Renals: There are atherosclerotic changes at the origins of both renal arteries resulting in mild stenosis. IMA: Patent without evidence of aneurysm, dissection, vasculitis or significant stenosis. Inflow: Patent without evidence of aneurysm, dissection, vasculitis or significant stenosis. Proximal Outflow: Bilateral common femoral and visualized portions of the superficial and profunda femoral arteries are patent without evidence of aneurysm, dissection, vasculitis or significant stenosis. Veins: No obvious venous abnormality within the limitations of this arterial phase study. Review of the MIP images confirms the above findings. NON-VASCULAR Lower  chest: There are trace bilateral pleural effusions, right greater than left.The heart size is normal. Hepatobiliary: The liver is normal. Normal gallbladder.There is no biliary ductal dilation. Pancreas: Normal contours without ductal dilatation. No peripancreatic fluid collection. Spleen: Unremarkable. Adrenals/Urinary Tract: --Adrenal glands: Unremarkable. --Right kidney/ureter: No hydronephrosis or radiopaque kidney stones. --Left kidney/ureter: No hydronephrosis or radiopaque kidney stones. --Urinary bladder: Unremarkable. Stomach/Bowel: --Stomach/Duodenum: No hiatal hernia or other gastric abnormality. Normal duodenal course and caliber. --Small bowel: There is no evidence for small bowel obstruction. Again noted are few loops of small bowel in the mid abdomen that demonstrate circumferential wall thickening. --Colon: Unremarkable. --Appendix: Normal. Lymphatic: --No retroperitoneal lymphadenopathy. --No mesenteric lymphadenopathy. --No pelvic or inguinal lymphadenopathy. Reproductive: Unremarkable Other: No ascites or free air. There are fat containing bilateral inguinal hernias, left greater than right. Musculoskeletal. No acute displaced fractures. IMPRESSION: 1. 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 2. Mild narrowing at the origin of the celiac axis. 3. Improving appearance of the small bowel without evidence for small bowel obstruction. There are few persistent areas of small-bowel wall thickening which may be secondary to an enteritis. Ischemic enteritis seems less likely given the robust collateral blood supply at this level in addition to a patent celiac axis and IMA. 4. Trace bilateral pleural effusions, right greater than left. 5. Fat containing bilateral inguinal hernias, left greater than right. Aortic Atherosclerosis (ICD10-I70.0). Electronically Signed   By: Constance Holster M.D.   On: 08/05/2020 17:31        Scheduled Meds: .  dextromethorphan-guaiFENesin  1 tablet Oral BID  . enoxaparin (LOVENOX) injection  40 mg Subcutaneous Q24H  . fluticasone  2 spray Each Nare Daily  . furosemide  20 mg Intravenous Q12H  . gabapentin  100 mg Oral TID  . insulin aspart  0-9 Units Subcutaneous TID WC  . sodium chloride flush  3 mL Intravenous Q12H   Continuous Infusions:    LOS: 2 days    Time spent: 40 minutes    Irine Seal, MD [facility]   To contact the attending provider between 7A-7P or the covering provider during after hours 7P-7A, please log into the web site [email or web address] and access using universal [place] password for that web site. If you do not have the password, please call the hospital operator.  08/06/2020, 9:00 PM

## 2020-08-06 NOTE — Consult Note (Signed)
REASON FOR CONSULT:    SMA stenosis.  The consult is requested by Nicoletta Ba.   ASSESSMENT & PLAN:   MESENTERIC ARTERY OCCLUSIVE DISEASE: This patient does have evidence of atherosclerotic plaque at the origin of the celiac axis and superior mesenteric arteries.  However, these do not appear to be flow-limiting and I do not think that his symptoms can be attributed to chronic mesenteric ischemia or acute mesenteric ischemia.  He denies any postprandial abdominal pain and has had no weight loss.  Thus at this point I do not think any further vascular work-up is indicated.  He is on aspirin and is on a statin.  He quit smoking 40 years ago.  We have discussed the importance of exercise and nutrition.  I be happy to see him back at any time if any new vascular issues arise.  Deitra Mayo, MD Office: [phone]   HPI:   [name] is a pleasant 85 y.o. male, who was admitted 2 days ago with upper abdominal pain.  On my history the patient had developed similar pain approximately 5 years ago and has had intermittent episodes of upper abdominal pain which comes and goes.  2 days ago he had return of his symptoms that were more severe and he presented to the [facility] emergency department.  Part of his work-up included a CT angiogram of the abdomen and pelvis which showed stenosis of the superior mesenteric artery and vascular surgery was consulted.  On my history the patient notes the gradual onset of upper abdominal pain that began Saturday night.  He states that he has had similar symptoms multiple times in the past.  The symptoms come and go.  There are no aggravating or alleviating factors.  He specifically denies any history of postprandial abdominal pain.  He denies any history of weight loss.  He is on Eliquis for atrial fibrillation.  He states that his symptoms did not come on suddenly.  He denies any history of claudication or rest pain.  Past Medical History:    Diagnosis Date  . Acute diastolic CHF (congestive heart failure) ([place]) 08/04/2020  . Arthritis   . Atrial fibrillation ([place])    post op; amiodarone and coumadin continued for 3 mos post op  . CARCINOMA, SKIN, SQUAMOUS CELL 01/07/2010  . Cataract    beginning of cataracts  . COLONIC POLYPS, HX OF 04/26/2007  . Coronary artery disease    s/p CABG 3/12: L-LAD, S-CFX (Dr. Roxy Manns); EF 45% at cath prior to CABG  . DIABETES MELLITUS, TYPE II 04/26/2007  . Hyperlipidemia   . HYPERTENSION 04/26/2007  . NEPHROLITHIASIS, HX OF 04/26/2007  . SBO (small bowel obstruction) ([place]) 10/2015   Partial   . Shortness of breath dyspnea    with exertion  . Unspecified hearing loss 12/18/2008    Family History  Problem Relation Age of Onset  . Heart failure Mother   . Heart disease Father   . Cancer Sister   . Cancer Brother   . Heart disease Brother   . Breast cancer Sister   . Colon cancer Neg Hx   . Esophageal cancer Neg Hx   . Rectal cancer Neg Hx   . Stomach cancer Neg Hx     SOCIAL HISTORY: Social History   Socioeconomic History  . Marital status: Married    Spouse name: Not on file  . Number of children: Not on file  . Years of education: Not on file  . Highest  education level: Not on file  Occupational History  . Not on file  Tobacco Use  . Smoking status: Former Smoker    Packs/day: 2.00    Years: 30.00    Pack years: 60.00    Types: Cigarettes    Quit date: 01/02/1981    Years since quitting: 39.6  . Smokeless tobacco: Never Used  Vaping Use  . Vaping Use: Never used  Substance and Sexual Activity  . Alcohol use: No    Alcohol/week: 0.0 standard drinks  . Drug use: No  . Sexual activity: Not on file  Other Topics Concern  . Not on file  Social History Narrative  . Not on file   Social Determinants of Health   Financial Resource Strain:   . Difficulty of Paying Living Expenses: Not on file  Food Insecurity:   . Worried About Charity fundraiser in the Last Year: Not  on file  . Ran Out of Food in the Last Year: Not on file  Transportation Needs:   . Lack of Transportation (Medical): Not on file  . Lack of Transportation (Non-Medical): Not on file  Physical Activity:   . Days of Exercise per Week: Not on file  . Minutes of Exercise per Session: Not on file  Stress:   . Feeling of Stress : Not on file  Social Connections:   . Frequency of Communication with Friends and Family: Not on file  . Frequency of Social Gatherings with Friends and Family: Not on file  . Attends Religious Services: Not on file  . Active Member of Clubs or Organizations: Not on file  . Attends Archivist Meetings: Not on file  . Marital Status: Not on file  Intimate Partner Violence:   . Fear of Current or Ex-Partner: Not on file  . Emotionally Abused: Not on file  . Physically Abused: Not on file  . Sexually Abused: Not on file    No Known Allergies  Current Facility-Administered Medications  Medication Dose Route Frequency Provider Last Rate Last Admin  . dextromethorphan-guaiFENesin (MUCINEX DM) 30-600 MG per 12 hr tablet 1 tablet  1 tablet Oral BID Blount, Xenia T, NP      . enoxaparin (LOVENOX) injection 40 mg  40 mg Subcutaneous Q24H Harold Hedge, MD   40 mg at 08/05/20 2100  . fluticasone (FLONASE) 50 MCG/ACT nasal spray 2 spray  2 spray Each Nare Daily Eugenie Filler, MD      . furosemide (LASIX) injection 20 mg  20 mg Intravenous Q12H Eugenie Filler, MD   20 mg at 08/06/20 0615  . gabapentin (NEURONTIN) capsule 100 mg  100 mg Oral TID Eugenie Filler, MD   100 mg at 08/06/20 0940  . influenza vaccine adjuvanted (FLUAD) injection 0.5 mL  0.5 mL Intramuscular Prior to discharge Eugenie Filler, MD      . insulin aspart (novoLOG) injection 0-9 Units  0-9 Units Subcutaneous TID WC Eugenie Filler, MD   2 Units at 08/06/20 1211  . ondansetron (ZOFRAN) tablet 4 mg  4 mg Oral Q6H PRN Harold Hedge, MD       Or  . ondansetron [facility])  injection 4 mg  4 mg Intravenous Q6H PRN Harold Hedge, MD      . sodium chloride flush (NS) 0.9 % injection 3 mL  3 mL Intravenous Q12H Harold Hedge, MD   3 mL at 08/06/20 0940  . traMADol (ULTRAM) tablet 50 mg  50 mg Oral Q6H PRN Eugenie Filler, MD   50 mg at 08/06/20 3235    REVIEW OF SYSTEMS:  [X]  denotes positive finding, [ ]  denotes negative finding Cardiac  Comments:  Chest pain or chest pressure:    Shortness of breath upon exertion:    Short of breath when lying flat:    Irregular heart rhythm:        Vascular    Pain in calf, thigh, or hip brought on by ambulation:    Pain in feet at night that wakes you up from your sleep:     Blood clot in your veins:    Leg swelling:         Pulmonary    Oxygen at home:    Productive cough:     Wheezing:         Neurologic    Sudden weakness in arms or legs:     Sudden numbness in arms or legs:     Sudden onset of difficulty speaking or slurred speech:    Temporary loss of vision in one eye:     Problems with dizziness:         Gastrointestinal    Blood in stool:     Vomited blood:         Genitourinary    Burning when urinating:     Blood in urine:        Psychiatric    Major depression:         Hematologic    Bleeding problems:    Problems with blood clotting too easily:        Skin    Rashes or ulcers:        Constitutional    Fever or chills:     PHYSICAL EXAM:   Vitals:   08/05/20 1948 08/06/20 0556 08/06/20 0947 08/06/20 1429  BP: (!) 141/67 (!) 93/51 (!) 128/56 134/63  Pulse: 73 71  82  Resp: 18 20  16   Temp: 98.4 F (36.9 C) 98.1 F (36.7 C)    TempSrc: Oral Oral    SpO2: 94% 95%  91%  Weight:      Height:       Body mass index is 34.26 kg/m.  GENERAL: The patient is a well-nourished male, in no acute distress. The vital signs are documented above. CARDIAC: There is a regular rate and rhythm.  VASCULAR: I do not detect carotid bruits. On the right side he has a palpable femoral  pulse.  I cannot palpate a popliteal or pedal pulses.  The right foot is warm and well-perfused. On the left side he has a palpable femoral pulse and dorsalis pedis pulse. He has no significant lower extremity swelling. PULMONARY: There is good air exchange bilaterally without wheezing or rales. ABDOMEN: Soft and non-tender with normal pitched bowel sounds.  I am unable to assess for aneurysm given his body habitus.  I do not appreciate abdominal bruits. MUSCULOSKELETAL: There are no major deformities or cyanosis. NEUROLOGIC: No focal weakness or paresthesias are detected. SKIN: There are no ulcers or rashes noted. PSYCHIATRIC: The patient has a normal affect.  DATA:    LABS: GFR is greater than 60.  CT ANGIOGRAM ABDOMEN PELVIS: I have reviewed the images of his CT angiogram of the abdomen and pelvis.  He does have calcific plaque adjacent to the origin of the celiac artery.  The celiac artery appears to be widely patent.  He also has calcific disease in  the proximal superior mesenteric artery.  However this disease does not appear to be flow-limiting.  His IMA is also patent.

## 2020-08-06 NOTE — Evaluation (Signed)
Physical Therapy Evaluation Patient Details Name: [name] MRN: [id number] DOB: 12-11-1934 Today's Date: 08/06/2020   History of Present Illness  85 yo male admitted with SBO, dyspnea. Hx of CAD, CABG, DM, COVID, HF  Clinical Impression  On eval, pt was Supv-Min guard level for mobility. He walked ~215 feet around the unit. Pt c/o moderate back pain during session. O2 91% on RA, dyspnea 2/4 with ambulation. Will plan to follow during hospital stay. Do not anticipate any f/u PT needs.     Follow Up Recommendations No PT follow up    Equipment Recommendations  None recommended by PT    Recommendations for Other Services       Precautions / Restrictions Precautions Precautions: Fall Restrictions Weight Bearing Restrictions: No      Mobility  Bed Mobility               General bed mobility comments: oob in recliner  Transfers Overall transfer level: Needs assistance Equipment used: Rolling walker (2 wheeled) Transfers: Sit to/from Stand Sit to Stand: Supervision         General transfer comment: for safety  Ambulation/Gait Ambulation/Gait assistance: Min guard Gait Distance (Feet): 215 Feet Assistive device: None Gait Pattern/deviations: Step-through pattern;Decreased stride length     General Gait Details: slow gait speed. pt c/o back pain that improved some with ambulation. O2 91% on RA, dyspnea 2/4.  Stairs            Wheelchair Mobility    Modified Rankin (Stroke Patients Only)       Balance Overall balance assessment: Mild deficits observed, not formally tested                                           Pertinent Vitals/Pain Pain Assessment: 0-10 Pain Score: 6  Pain Location: back Pain Descriptors / Indicators: Aching;Discomfort;Sore Pain Intervention(s): Heat applied;Monitored during session;Repositioned    Home Living Family/patient expects to be discharged to:: Private residence Living Arrangements:  Spouse/significant other;Children Available Help at Discharge: Family Type of Home: House Home Access: Stairs to enter Entrance Stairs-Rails: Can reach both Entrance Stairs-Number of Steps: 6 front 1 back Home Layout: One level Home Equipment: Grab bars - tub/shower;Tub bench Additional Comments: Pt. wife has a walker and wc.    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     Hand Dominance   Dominant Hand: Right    Extremity/Trunk Assessment   Upper Extremity Assessment Upper Extremity Assessment: Defer to OT evaluation    Lower Extremity Assessment Lower Extremity Assessment: Generalized weakness    Cervical / Trunk Assessment Cervical / Trunk Assessment: Normal  Communication   Communication: No difficulties  Cognition Arousal/Alertness: Awake/alert Behavior During Therapy: WFL for tasks assessed/performed Overall Cognitive Status: Within Functional Limits for tasks assessed                                        General Comments      Exercises     Assessment/Plan    PT Assessment Patient needs continued PT services  PT Problem List Pain;Decreased mobility       PT Treatment Interventions Therapeutic exercise;Therapeutic activities;Patient/family education;Gait training;Functional mobility training    PT Goals (Current goals can  be found in the Care Plan section)  Acute Rehab PT Goals Patient Stated Goal: go home PT Goal Formulation: With patient Time For Goal Achievement: 08/20/20 Potential to Achieve Goals: Good    Frequency Min 3X/week   Barriers to discharge        Co-evaluation               AM-PAC PT "6 Clicks" Mobility  Outcome Measure Help needed turning from your back to your side while in a flat bed without using bedrails?: None Help needed moving from lying on your back to sitting on the side of a flat bed without using bedrails?: None Help needed moving to and  from a bed to a chair (including a wheelchair)?: None Help needed standing up from a chair using your arms (e.g., wheelchair or bedside chair)?: None Help needed to walk in hospital room?: A Little Help needed climbing 3-5 steps with a railing? : A Little 6 Click Score: 22    End of Session Equipment Utilized During Treatment: Gait belt Activity Tolerance: Patient tolerated treatment well Patient left: in chair;with call bell/phone within reach;with chair alarm set   PT Visit Diagnosis: Pain;Unsteadiness on feet (R26.81) Pain - part of body:  (back)    Time: 1152-1207 PT Time Calculation (min) (ACUTE ONLY): 15 min   Charges:   PT Evaluation $PT Eval Low Complexity: [place], PT Acute Rehabilitation  Office: [phone] Pager: [phone]

## 2020-08-06 NOTE — Progress Notes (Signed)
Patient ID: [name], male   DOB: 03-Dec-1934, 85 y.o.   MRN: [id number]  GI brief note - as per discussion with Dr Bryan Lemma - have asked Vascular surgery to consult  today .

## 2020-08-06 NOTE — Evaluation (Signed)
Occupational Therapy Evaluation Patient Details Name: [name] MRN: [id number] DOB: 07-Mar-1935 Today's Date: 08/06/2020    History of Present Illness This is an 85 year old male with past medical history of diabetes, hypertension, hyperlipidemia, CAD s/p CABG, atrial fibrillation on Eliquis, partial SBO, ILD, COVID-19 (December 2020) HFpEF (last EF 40 to 45% in 2019) who presented to the ED with complaints of gradual onset, constant waxing and waning periumbilical abdominal pain x12 hours with associated nausea similar to prior SBO.  Reportedly had a normal BM yesterday.  Still passing gas feels as though the abdominal pain is making him short of breath.  He is supposed to take Lasix 3 times a week however frequently forgets.   Clinical Impression   Pt. Is at baseline for ADLs. Pt. Wife has TTB that he can use if he feels he needs it. Pt. Was able to ambulate to commode and perform transfer without assist. Pt. Does not have any skilled OT needs> one time evaluation performed. No further needs.     Follow Up Recommendations  No OT follow up    Equipment Recommendations  None recommended by OT    Recommendations for Other Services       Precautions / Restrictions Precautions Precautions: Fall Restrictions Weight Bearing Restrictions: No      Mobility Bed Mobility                  Transfers                 General transfer comment: Pt. ambulated to bathroom without ad.     Balance                                           ADL either performed or assessed with clinical judgement   ADL Overall ADL's : Modified independent;At baseline                                       General ADL Comments: Pt. uses cross leg techique for dressing.      Vision Baseline Vision/History: Wears glasses (needs cataract sx on r) Wears Glasses: At all times Patient Visual Report: No change from baseline       Perception      Praxis      Pertinent Vitals/Pain Pain Assessment: 0-10 Pain Score: 3  Pain Location: back Pain Descriptors / Indicators: Aching Pain Intervention(s): Premedicated before session     Hand Dominance Right   Extremity/Trunk Assessment Upper Extremity Assessment Upper Extremity Assessment: Overall WFL for tasks assessed           Communication Communication Communication: No difficulties   Cognition Arousal/Alertness: Awake/alert Behavior During Therapy: WFL for tasks assessed/performed Overall Cognitive Status: Within Functional Limits for tasks assessed                                     General Comments       Exercises     Shoulder Instructions      Home Living Family/patient expects to be discharged to:: Private residence Living Arrangements: Spouse/significant other;Children Available Help at Discharge: Family Type of Home: House Home Access: Stairs to enter [employer] of Steps: 6 front 1 back Entrance  Stairs-Rails: Can reach both Home Layout: One level     Bathroom Shower/Tub: Teacher, early years/pre: Handicapped height     Home Equipment: Grab bars - tub/shower;Tub bench   Additional Comments: Pt. wife has a walker and wc.      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        OT Problem List:        OT Treatment/Interventions:      OT Goals(Current goals can be found in the care plan section) Acute Rehab OT Goals Patient Stated Goal: go home  OT Frequency:     Barriers to D/C:            Co-evaluation              AM-PAC OT "6 Clicks" Daily Activity     Outcome Measure Help from another person eating meals?: None Help from another person taking care of personal grooming?: None Help from another person toileting, which includes using toliet, bedpan, or urinal?: None Help from another person bathing (including washing,  rinsing, drying)?: None Help from another person to put on and taking off regular upper body clothing?: None Help from another person to put on and taking off regular lower body clothing?: None 6 Click Score: 24   End of Session Nurse Communication:  (ok therapy.)  Activity Tolerance: Patient tolerated treatment well Patient left: in chair;with chair alarm set;with call bell/phone within reach                   Time: 1023-1046 OT Time Calculation (min): 23 min Charges:  OT General Charges $OT Visit: 1 Visit OT Evaluation $OT Eval Low Complexity: 1 Low  Reece Packer OT/L  Yadriel Kerrigan 08/06/2020, 10:47 AM

## 2020-08-07 ENCOUNTER — Other Ambulatory Visit: Payer: Self-pay

## 2020-08-07 DIAGNOSIS — K56609 Unspecified intestinal obstruction, unspecified as to partial versus complete obstruction: Secondary | ICD-10-CM

## 2020-08-07 LAB — BASIC METABOLIC PANEL
Anion gap: 9 (ref 5–15)
BUN: 18 mg/dL (ref 8–23)
CO2: 28 mmol/L (ref 22–32)
Calcium: 8.9 mg/dL (ref 8.9–10.3)
Chloride: 100 mmol/L (ref 98–111)
Creatinine, Ser: 1 mg/dL (ref 0.61–1.24)
GFR calc Af Amer: >60 mL/min (ref >60)
GFR calc non Af Amer: >60 mL/min (ref >60)
Glucose, Bld: 147 mg/dL — ABNORMAL HIGH (ref 70–99)
Potassium: 3.8 mmol/L (ref 3.5–5.1)
Sodium: 137 mmol/L (ref 135–145)

## 2020-08-07 LAB — CBC WITH DIFFERENTIAL/PLATELET
Abs Immature Granulocytes: 0.02 10*3/uL (ref 0.00–0.07)
Basophils Absolute: 0 10*3/uL (ref 0.0–0.1)
Basophils Relative: 1 %
Eosinophils Absolute: 0.2 10*3/uL (ref 0.0–0.5)
Eosinophils Relative: 3 %
HCT: 36 % — ABNORMAL LOW (ref 39.0–52.0)
Hemoglobin: 11.2 g/dL — ABNORMAL LOW (ref 13.0–17.0)
Immature Granulocytes: 0 %
Lymphocytes Relative: 30 %
Lymphs Abs: 1.6 10*3/uL (ref 0.7–4.0)
MCH: 25.8 pg — ABNORMAL LOW (ref 26.0–34.0)
MCHC: 31.1 g/dL (ref 30.0–36.0)
MCV: 82.9 fL (ref 80.0–100.0)
Monocytes Absolute: 0.8 10*3/uL (ref 0.1–1.0)
Monocytes Relative: 15 %
Neutro Abs: 2.8 10*3/uL (ref 1.7–7.7)
Neutrophils Relative %: 51 %
Platelets: 277 10*3/uL (ref 150–400)
RBC: 4.34 MIL/uL (ref 4.22–5.81)
RDW: 16.2 % — ABNORMAL HIGH (ref 11.5–15.5)
WBC: 5.4 10*3/uL (ref 4.0–10.5)
nRBC: 0 % (ref 0.0–0.2)

## 2020-08-07 LAB — GLUCOSE, CAPILLARY: Glucose-Capillary: 156 mg/dL — ABNORMAL HIGH (ref 70–99)

## 2020-08-07 LAB — MAGNESIUM: Magnesium: 2.6 mg/dL — ABNORMAL HIGH (ref 1.7–2.4)

## 2020-08-07 MED ORDER — METFORMIN HCL 500 MG PO TABS: 500.0000 mg | ORAL_TABLET | Freq: Three times a day (TID) | ORAL | Status: DC

## 2020-08-07 MED ORDER — APIXABAN 5 MG PO TABS
5.0000 mg | ORAL_TABLET | Freq: Two times a day (BID) | ORAL | Status: DC
  Administered 2020-08-07: 5 mg via ORAL
  Filled 2020-08-07: qty 1

## 2020-08-07 NOTE — Discharge Instructions (Signed)

## 2020-08-07 NOTE — Care Management Important Message (Signed)
Important Message  Patient Details IM Letter given to the Patient Name: [name] MRN: [id number] Date of Birth: [birth year]   Medicare Important Message Given:  Yes     Kerin Salen 08/07/2020, 10:44 AM

## 2020-08-07 NOTE — Progress Notes (Signed)
Patient ID: [name], male   DOB: 12-Dec-1934, 85 y.o.   MRN: [id number]    Progress Note   Subjective   Day # 3  CC; SBO  WBC 5.4/hemoglobin 11.2  Patient says he feels very well, better than he has in some time.  He is hoping to be discharged this morning.  He has no complaints of abdominal pain or distention, has been tolerating solid food.  He did have a bowel movement last evening.  Vascular surgery consult appreciated   Objective   Vital signs in last 24 hours: Temp:  [97.8 F (36.6 C)-97.9 F (36.6 C)] 97.9 F (36.6 C) (09/22 0655) Pulse Rate:  [70-82] 70 (09/22 0655) Resp:  [16-18] 18 (09/22 0655) BP: (121-151)/(56-89) 121/89 (09/22 0655) SpO2:  [86 %-91 %] 86 % (09/22 0655) Last BM Date: 08/06/20 General:    Elderly white male in NAD Heart:  Regular rate and rhythm; no murmurs Lungs: Respirations even and unlabored, lungs CTA bilaterally Abdomen:  Soft, nontender and nondistended. Normal bowel sounds. Extremities:  Without edema. Neurologic:  Alert and oriented,  grossly normal neurologically. Psych:  Cooperative. Normal mood and affect.  Intake/Output from previous day: 09/21 0701 - 09/22 0700 In: 840 [P.O.:840] Out: 750 [Urine:750] Intake/Output this shift: No intake/output data recorded.  Lab Results: Recent Labs    08/05/20 0552 08/06/20 0531 08/07/20 0619  WBC 6.8 5.8 5.4  HGB 10.7* 11.2* 11.2*  HCT 35.7* 36.6* 36.0*  PLT 272 274 277   BMET Recent Labs    08/05/20 0552 08/06/20 0531 08/07/20 0619  NA 140 136 137  K 3.5 3.6 3.8  CL 101 100 100  CO2 26 26 28   GLUCOSE 137* 142* 147*  BUN 15 15 18   CREATININE 0.94 1.00 1.00  CALCIUM 9.0 8.9 8.9   LFT Recent Labs    08/05/20 0552  PROT 7.1  ALBUMIN 3.8  AST 25  ALT 22  ALKPHOS 44  BILITOT 0.8   PT/INR No results for input(s): LABPROT, INR in the last 72 hours.  Studies/Results: CT Angio Abd/Pel w/ and/or w/o  Result Date: 08/05/2020 CLINICAL DATA:  Recurrent  small-bowel obstruction.  SMA stenosis. EXAM: CTA ABDOMEN AND PELVIS WITHOUT AND WITH CONTRAST TECHNIQUE: Multidetector CT imaging of the abdomen and pelvis was performed using the standard protocol during bolus administration of intravenous contrast. Multiplanar reconstructed images and MIPs were obtained and reviewed to evaluate the vascular anatomy. CONTRAST:  184mL OMNIPAQUE IOHEXOL 350 MG/ML SOLN COMPARISON:  08/04/2020 FINDINGS: VASCULAR Aorta: There are atherosclerotic changes throughout the abdominal aorta without evidence for dissection or aneurysm. Celiac: There is mild narrowing at the origin of the celiac axis. SMA: There is a replaced right hepatic artery arising from the SMA. 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 The remaining portions of the SMA are unremarkable. Renals: There are atherosclerotic changes at the origins of both renal arteries resulting in mild stenosis. IMA: Patent without evidence of aneurysm, dissection, vasculitis or significant stenosis. Inflow: Patent without evidence of aneurysm, dissection, vasculitis or significant stenosis. Proximal Outflow: Bilateral common femoral and visualized portions of the superficial and profunda femoral arteries are patent without evidence of aneurysm, dissection, vasculitis or significant stenosis. Veins: No obvious venous abnormality within the limitations of this arterial phase study. Review of the MIP images confirms the above findings. NON-VASCULAR Lower chest: There are trace bilateral pleural effusions, right greater than left.The heart size is  normal. Hepatobiliary: The liver is normal. Normal gallbladder.There is no biliary ductal dilation. Pancreas: Normal contours without ductal dilatation. No peripancreatic fluid collection. Spleen: Unremarkable. Adrenals/Urinary Tract: --Adrenal glands: Unremarkable. --Right kidney/ureter: No hydronephrosis or radiopaque kidney stones.  --Left kidney/ureter: No hydronephrosis or radiopaque kidney stones. --Urinary bladder: Unremarkable. Stomach/Bowel: --Stomach/Duodenum: No hiatal hernia or other gastric abnormality. Normal duodenal course and caliber. --Small bowel: There is no evidence for small bowel obstruction. Again noted are few loops of small bowel in the mid abdomen that demonstrate circumferential wall thickening. --Colon: Unremarkable. --Appendix: Normal. Lymphatic: --No retroperitoneal lymphadenopathy. --No mesenteric lymphadenopathy. --No pelvic or inguinal lymphadenopathy. Reproductive: Unremarkable Other: No ascites or free air. There are fat containing bilateral inguinal hernias, left greater than right. Musculoskeletal. No acute displaced fractures. IMPRESSION: 1. 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 2. Mild narrowing at the origin of the celiac axis. 3. Improving appearance of the small bowel without evidence for small bowel obstruction. There are few persistent areas of small-bowel wall thickening which may be secondary to an enteritis. Ischemic enteritis seems less likely given the robust collateral blood supply at this level in addition to a patent celiac axis and IMA. 4. Trace bilateral pleural effusions, right greater than left. 5. Fat containing bilateral inguinal hernias, left greater than right. Aortic Atherosclerosis (ICD10-I70.0). Electronically Signed   By: Constance Holster M.D.   On: 08/05/2020 17:31       Assessment / Plan:    #52 85 year old white male with recurrent partial small bowel obstruction.  Symptoms have completely resolved.  Etiology of the mid small bowel circumferential thickening noted on imaging not clear. No prior abdominal surgery-question internal hernia, doubt IBD but cannot rule out Vascular surgery does not feel that acute or chronic mesenteric ischemia is responsible.  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  Plan; okay for discharge from GI standpoint. Low residue diet on discharge Will arrange for CT enterography, to be done as an outpatient, probably next week, then office follow-up with Dr. Fuller Plan thereafter.  Patient aware that our office will call him tomorrow regarding scheduling for the CT enterography.    Principal Problem:   SBO (small bowel obstruction) (HCC) Active Problems:   Diabetes mellitus, type 2 (HCC)   Paroxysmal atrial fibrillation (HCC)   Abdominal pain   Acute respiratory failure with hypoxia (HCC)   Acute diastolic CHF (congestive heart failure) ([place])   Essential hypertension     LOS: 3 days   Page Lancon EsterwoodPA-C  08/07/2020, 8:53 AM

## 2020-08-07 NOTE — Discharge Summary (Addendum)
Physician Discharge Summary  [name] DXA:[id number] DOB: 1935/02/22  PCP: Cassandria Anger, MD  Admitted from: Home Discharged to: Home  Admit date: 08/04/2020 Discharge date: 08/07/2020  Recommendations for Outpatient Follow-up:    Follow-up Information    [name], Evie Lacks, MD. Schedule an appointment as soon as possible for a visit in 1 week(s).   Specialty: Internal Medicine Why: To be seen with repeat labs (CBC & BMP). Contact information: [place] [address] [phone]        Fay Records, MD .   Specialty: Cardiology Contact information: [address] [place] [address] [phone]        Ladene Artist, MD. Schedule an appointment as soon as possible for a visit.   Specialty: Gastroenterology Why: MDs office will call you with appointment for CT enterography and subsequent MD follow-up. Contact information: [address] [place] [address] [phone]                Home Health: None Equipment/Devices: None  Discharge Condition: Improved and stable CODE STATUS: Partial code/DNI Diet recommendation: Low residue heart healthy and diabetic diet.  Discharge Diagnoses:  Principal Problem:   SBO (small bowel obstruction) (HCC) Active Problems:   Diabetes mellitus, type 2 (HCC)   Paroxysmal atrial fibrillation (HCC)   Abdominal pain   Acute respiratory failure with hypoxia (HCC)   Acute diastolic CHF (congestive heart failure) (HCC)   Essential hypertension   Brief Summary: 85 year old male, lives with family and independent, PMH of type II DM, hypertension, hyperlipidemia, CAD s/p CABG, atrial fibrillation on Eliquis, partial SBO, ILD, COVID-19 (December 2020), prior chronic systolic CHF (last EF 47-65% in 2019) who presented to the ED with complaints of gradual onset, constant waxing and waning periumbilical abdominal pain of 12 hours duration with associated nausea, similar  to prior presentation with SBO.  He had normal BM day prior to admission.  He was still passing flatus.  He describes some dyspnea.  Since he had COVID-19 in December 2020, had been having dyspnea which reportedly was worse recently and was sent to a pulmonologist.  He had a CT chest on 7/22 which showed fibrotic ILD with slight basilar predominance categorized as probable UIP.  He saw Dr. Lamonte Sakai on 9/15 who felt this was probably from his COVID-19 and had PFTs at that time which showed some restriction with superimposed obstruction and was prescribed albuterol.  ED Course:Afebrile, hypertensive and tachypneic with SPO2 87% on room air requiring 2 L. Labs: CO2 21, glucose 169, BNP 475, troponin 21->25, WBC 8.0, Hb 11.0, UA with ketones. CXR with findings suggestive of mild CHF. CT abdomen pelvis: Findings suggestive of chronic ischemia VS. Sequelae of inflammatory bowel disease and relatively low-grade obstruction.   Assessment and plan:  Recurrent partial small bowel obstruction: General surgery and [facility] consulted and assisted with evaluation and management.  Patient was treated conservatively with bowel rest/n.p.o., NG tube and IV fluids.  CT abdomen and pelvis showed thickening of right-sided colon and findings suggestive of chronic ischemia as well as low-grade obstruction.  He was started on clear liquid diet and diet was gradually advanced.  He has tolerated regular consistency diet.  He has had a BM last night.  Passing flatus today.  GI obtained CTA of abdomen to further assess mesenteric vasculature which showed high-grade stenosis at the origin of the SMA, mild narrowing origin of the celiac axis, IMA patent without evidence of stenosis,  improving appearance of small bowel without evidence of small bowel obstruction, few persistent areas of small bowel wall thickening which may be secondary to enteritis.  Ischemic enteritis seemed less likely with good collateral blood supply at this  level in addition to patent celiac axis and IMA.  As per GI recommendation, vascular surgery was consulted.  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  However they indicated that these do not appear to be flow-limiting and they did not think that his symptoms can be attributed to chronic mesenteric ischemia or acute mesenteric ischemia.  Thus at this point they did not think any further vascular work-up was indicated.  He is already on aspirin and on statin.  He quit smoking 40 years ago.  GI and general surgery evaluated today and have cleared him for discharge.  GI indicate that the etiology of the mid small bowel circumferential thickening noted on imaging is not clear.  He has had no prior abdominal surgery-question internal hernia, the doubt IBD but cannot be ruled out.  GI will arrange for CT enterography as outpatient and for outpatient follow-up.  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  On room air at home.  On admission required 2 L/min nasal cannula oxygen.  TTE showed LVEF of 55-60% without wall motion abnormalities and grade 2 diastolic dysfunction.  He was treated with couple of doses of IV Lasix in the hospital.  He has diuresed well and clinically appears euvolemic.  Continue prior home dose of 3 times per week oral Lasix.  Discussed with patient and his daughter regarding importance of compliance with all aspects of his medical care.  They verbalized understanding.  Patient has a follow-up appointment with his primary cardiologist/Dr. Harrington Challenger on 9/24.  Hypoxia resolved as per RN based on desaturation protocol prior to discharge.  Paroxysmal atrial fibrillation: CHA2DS2VASC score at  least 5 (age, hypertension, vascular disease, diabetes).  While patient was n.p.o., he was temporarily on IV Lasix and Eliquis was on hold in case he needed any procedures.  Continue prior home dose of Toprol-XL, amiodarone and Eliquis at DC.  Of note patient also is on aspirin 81 mg daily PTA which obviously increases his bleeding risk while on Eliquis.  Unclear why he is on both these and this can be addressed during close follow-up with cardiology on 9/24 if he needs to continue aspirin.  Essential hypertension: Controlled.  Continue Toprol-XL.  Dyslipidemia: Continue statins.  ILD Outpatient follow-up with pulmonology.  Continue prior respiratory medications.  Type II DM A1c 6.5 on 08/06/2020.  Resume prior home meds.  Good control.  Body mass index is 34.26 kg/m./Obesity Outpatient follow-up.  Anemia, suspect anemia of chronic disease Stable.  Outpatient evaluation and follow-up.    Consultations:  General surgery  [facility]  Vascular surgery  Procedures:  NG tube   Discharge Instructions  Discharge Instructions    (HEART FAILURE PATIENTS) Call MD:  Anytime you have any of the following symptoms: 1) 3 pound weight gain in 24 hours or 5 pounds in 1 week 2) shortness of breath, with or without a dry hacking cough 3) swelling in the hands, feet or stomach 4) if you have to sleep on extra pillows at night in order to breathe.  Complete by: As directed    Call MD for:  difficulty breathing, headache or visual disturbances   Complete by: As directed    Call MD for:  extreme fatigue   Complete by: As directed    Call MD for:  persistant dizziness or light-headedness   Complete by: As directed    Call MD for:  persistant nausea and vomiting   Complete by: As directed    Call MD for:  severe uncontrolled pain   Complete by: As directed    Call MD for:  temperature >100.4   Complete by: As directed    Diet - low sodium heart healthy   Complete by: As directed    Low  residue diet, i.e low fiber diet.   Diet Carb Modified   Complete by: As directed    Increase activity slowly   Complete by: As directed        Medication List    TAKE these medications   Accu-Chek Aviva Plus test strip Generic drug: glucose blood Use to check blood sugar daily DX:E11.59   Accu-Chek Aviva Plus w/Device Kit Use to check blood sugar daily DX:E11.59   Accu-Chek Softclix Lancets lancets Use to check blood sugar daily DX:E11.59   albuterol 108 (90 Base) MCG/ACT inhaler Commonly known as: VENTOLIN HFA Inhale 2 puffs into the lungs every 6 (six) hours as needed for wheezing or shortness of breath.   amiodarone 200 MG tablet Commonly known as: PACERONE TAKE 1 TABLET BY MOUTH EVERY DAY   aspirin 81 MG tablet Take 81 mg by mouth daily.   cholecalciferol 1000 units tablet Commonly known as: VITAMIN D Take 1,000 Units by mouth 2 (two) times daily.   Eliquis 5 MG Tabs tablet Generic drug: apixaban TAKE 1 TABLET BY MOUTH TWICE A DAY What changed: how much to take   furosemide 40 MG tablet Commonly known as: LASIX Take 1 tablet (40 mg total) by mouth as directed. Take 1 tablet every Mon, Wed, Fri   gabapentin 100 MG capsule Commonly known as: NEURONTIN TAKE 1 CAPSULE (100 MG TOTAL) BY MOUTH 3 (THREE) TIMES DAILY. OFFICE VISIT NEEDED What changed: See the new instructions.   latanoprost 0.005 % ophthalmic solution Commonly known as: XALATAN Place 1 drop into both eyes daily.   metFORMIN 500 MG tablet Commonly known as: GLUCOPHAGE Take 1 tablet (500 mg total) by mouth 3 (three) times daily. Do not take today 08/07/2020.  Start taking again from tomorrow 08/08/2020. What changed: additional instructions   metoprolol succinate 25 MG 24 hr tablet Commonly known as: TOPROL-XL TAKE 1 TABLET BY MOUTH EVERY DAY   pantoprazole 40 MG tablet Commonly known as: PROTONIX TAKE 1 TABLET BY MOUTH EVERY DAY   repaglinide 2 MG tablet Commonly known as: PRANDIN TAKE 1  TABLET BY MOUTH 3 TIMES DAILY BEFORE MEALS. What changed: See the new instructions.   simvastatin 10 MG tablet Commonly known as: ZOCOR TAKE 1 TABLET BY MOUTH EVERY DAY What changed: when to take this   Stiolto Respimat 2.5-2.5 MCG/ACT Aers Generic drug: Tiotropium Bromide-Olodaterol Inhale 2 puffs into the lungs daily.   Systane Balance 0.6 % Soln Generic drug: Propylene Glycol Place 1 drop into both eyes 2 (two) times daily as needed (for dry eyes).      No Known Allergies    Procedures/Studies: DG Chest 2 View  Result Date: 08/04/2020 CLINICAL DATA:  Shortness of breath. EXAM: CHEST - 2 VIEW COMPARISON:  10/24/2019 FINDINGS: Previous median sternotomy and  CABG procedure. Stable cardiac enlargement. Small right pleural effusion and diffuse pulmonary vascular congestion identified. No airspace consolidation. IMPRESSION: Suspect mild CHF. Electronically Signed   By: Kerby Moors M.D.   On: 08/04/2020 08:26   DG Abd 1 View  Result Date: 08/05/2020 CLINICAL DATA:  Follow-up small bowel obstruction EXAM: ABDOMEN - 1 VIEW COMPARISON:  Yesterday FINDINGS: Negative for obstructive pattern or over distended bowel. No concerning mass effect or gas collection. Moderate distension of the bladder by contrast. Left hip arthroplasty. IMPRESSION: No gas dilated bowel. Moderate distension of the bladder. Electronically Signed   By: Monte Fantasia M.D.   On: 08/05/2020 07:43   DG Abdomen 1 View  Result Date: 08/04/2020 CLINICAL DATA:  NG tube placement EXAM: ABDOMEN - 1 VIEW COMPARISON:  None. FINDINGS: Esophagogastric tube is positioned with tip and side-port at the level of the gastropathy or junction. Recommend slight advancement to ensure subdiaphragmatic placement of tip and side port. Gas-filled, although nondistended bowel throughout the included abdomen and pelvis. IMPRESSION: Esophagogastric tube is positioned with tip and side-port at the level of the gastropathy or junction. Recommend  slight advancement to ensure subdiaphragmatic placement of tip and side port. Electronically Signed   By: Eddie Candle M.D.   On: 08/04/2020 16:40   CT Abdomen Pelvis W Contrast  Result Date: 08/04/2020 CLINICAL DATA:  Bowel obstruction suspected.  Pain and swelling. EXAM: CT ABDOMEN AND PELVIS WITH CONTRAST TECHNIQUE: Multidetector CT imaging of the abdomen and pelvis was performed using the standard protocol following bolus administration of intravenous contrast. CONTRAST:  145m OMNIPAQUE IOHEXOL 300 MG/ML  SOLN COMPARISON:  January 29, 2019 FINDINGS: Lower chest: Small bilateral pleural effusions are identified, right greater than left. No nodules or masses. Reticular changes in the periphery of the lung were also seen in March of 2020, likely interstitial lung disease. No evidence of pneumonia. No other abnormalities in the lower chest. Hepatobiliary: No focal liver abnormality is seen. No gallstones, gallbladder wall thickening, or biliary dilatation. Pancreas: Unremarkable. No pancreatic ductal dilatation or surrounding inflammatory changes. Spleen: Normal in size without focal abnormality. Adrenals/Urinary Tract: Adrenal glands are unremarkable. Kidneys are normal, without renal calculi, focal lesion, or hydronephrosis. Bladder is unremarkable. Stomach/Bowel: The stomach is normal. The proximal small bowel is normal. There is a segment of ileum with wall thickening best seen on coronal images 37 through 47 in the right side of the abdomen. There is fecal material within this segmentally thickened loop of bowel. There is a relative transition point associated on coronal image 34. Proximal to the transition point is a mildly prominent loop of small bowel containing fecal material. Small bowel remains mildly prominent over at least 30 cm proximal to the transition point. The colon and appendix are normal. Vascular/Lymphatic: Calcified atherosclerosis is seen in the abdominal aorta and at the origins of the  branching vessels. There is dense atherosclerosis at the origin of the SMA in particular. Atherosclerotic change extends into the iliac and femoral vessels. No adenopathy. Reproductive: Prostate is unremarkable. Other: No abdominal wall hernia or abnormality. No abdominopelvic ascites. Musculoskeletal: The patient is status post left hip replacement. Degenerative changes are seen in the lumbar spine. IMPRESSION: 1. There is a segment of ileum in the right side of the abdomen which is relatively thick walled as described above. Just proximal to this segmentally thickened loop is at least 30-40 cm of mildly prominent small bowel which contains fecal material and is larger in caliber than the remainder of the small  bowel. The segmentally thickened loop of ileum may be secondary to chronic ischemia given the severe atherosclerotic change at the SMA origin. Sequela of inflammatory bowel disease is also possibility. Recommend consultation with gastroenterology. The segmentally thickened loop results in slowed transit and a relatively low-grade obstruction. 2. Atherosclerotic change in the abdominal aorta and branching vessels. 3. Small bilateral pleural effusions. 4. Probable interstitial lung disease in the lung bases. Electronically Signed   By: Dorise Bullion III M.D   On: 08/04/2020 13:56   ECHOCARDIOGRAM COMPLETE  Result Date: 08/05/2020    ECHOCARDIOGRAM REPORT   Patient Name:   [name] Date of Exam: 08/05/2020 Medical Rec #:  [id number]           Height:       68.0 in Accession #:    [id number]          Weight:       220.9 lb Date of Birth:  04-27-1935           BSA:          2.132 m Patient Age:    85 years            BP:           135/74 mmHg Patient Gender: M                   HR:           81 bpm. Exam Location:  Inpatient Procedure: 2D Echo and Intracardiac Opacification Agent Indications:    CHF- Acute Diastolic U31.49  History:        Patient has prior history of Echocardiogram examinations,  most                 recent 08/12/2018. CAD, Prior CABG, Arrythmias:Atrial                 Fibrillation; Risk Factors:Hypertension, Dyslipidemia and                 Diabetes.  Sonographer:    Mikki Santee RDCS (AE) Referring Phys: [id number] Harrison E SEGAL IMPRESSIONS  1. Left ventricular ejection fraction, by estimation, is 55 to 60%. The left ventricle has normal function. The left ventricle has no regional wall motion abnormalities. Left ventricular diastolic parameters are consistent with Grade II diastolic dysfunction (pseudonormalization). Elevated left ventricular end-diastolic pressure.  2. Right ventricular systolic function is normal. The right ventricular size is normal. Tricuspid regurgitation signal is inadequate for assessing PA pressure.  3. Left atrial size was mildly dilated.  4. The mitral valve is normal in structure. Trivial mitral valve regurgitation. No evidence of mitral stenosis. Moderate mitral annular calcification.  5. The aortic valve was not well visualized. Aortic valve regurgitation is not visualized. Mild aortic valve sclerosis is present, with no evidence of aortic valve stenosis.  6. The inferior vena cava is normal in size with greater than 50% respiratory variability, suggesting right atrial pressure of 3 mmHg. FINDINGS  Left Ventricle: Left ventricular ejection fraction, by estimation, is 55 to 60%. The left ventricle has normal function. The left ventricle has no regional wall motion abnormalities. Definity contrast agent was given IV to delineate the left ventricular  endocardial borders. The left ventricular internal cavity size was normal in size. There is no left ventricular hypertrophy. Left ventricular diastolic parameters are consistent with Grade II diastolic dysfunction (pseudonormalization). Elevated left ventricular end-diastolic pressure. Right Ventricle: The right ventricular size is normal. No increase in  right ventricular wall thickness. Right ventricular systolic  function is normal. Tricuspid regurgitation signal is inadequate for assessing PA pressure. Left Atrium: Left atrial size was mildly dilated. Right Atrium: Right atrial size was normal in size. Pericardium: There is no evidence of pericardial effusion. Mitral Valve: The mitral valve is normal in structure. Moderate mitral annular calcification. Trivial mitral valve regurgitation. No evidence of mitral valve stenosis. Tricuspid Valve: The tricuspid valve is normal in structure. Tricuspid valve regurgitation is trivial. No evidence of tricuspid stenosis. Aortic Valve: The aortic valve was not well visualized. Aortic valve regurgitation is not visualized. Mild aortic valve sclerosis is present, with no evidence of aortic valve stenosis. Pulmonic Valve: The pulmonic valve was normal in structure. Pulmonic valve regurgitation is not visualized. No evidence of pulmonic stenosis. Aorta: The aortic root is normal in size and structure. Venous: The inferior vena cava is normal in size with greater than 50% respiratory variability, suggesting right atrial pressure of 3 mmHg. IAS/Shunts: No atrial level shunt detected by color flow Doppler.  LEFT VENTRICLE PLAX 2D LVIDd:         4.20 cm  Diastology LVIDs:         3.10 cm  LV e' medial:    3.83 cm/s LV PW:         1.00 cm  LV E/e' medial:  34.5 LV IVS:        1.00 cm  LV e' lateral:   7.35 cm/s LVOT diam:     2.40 cm  LV E/e' lateral: 18.0 LV SV:         94 LV SV Index:   44 LVOT Area:     4.52 cm  RIGHT VENTRICLE TAPSE (M-mode): 0.9 cm LEFT ATRIUM              Index       RIGHT ATRIUM           Index LA diam:        3.60 cm  1.69 cm/m  RA Area:     18.30 cm LA Vol (A2C):   108.0 ml 50.66 ml/m RA Volume:   47.70 ml  22.37 ml/m LA Vol (A4C):   50.4 ml  23.64 ml/m LA Biplane Vol: 76.1 ml  35.70 ml/m  AORTIC VALVE LVOT Vmax:   79.20 cm/s LVOT Vmean:  56.000 cm/s LVOT VTI:    0.207 m  AORTA Ao Root diam: 3.50 cm MITRAL VALVE MV Area (PHT): 4.31 cm     SHUNTS MV Decel Time:  176 msec     Systemic VTI:  0.21 m MV E velocity: 132.00 cm/s  Systemic Diam: 2.40 cm MV A velocity: 85.90 cm/s MV E/A ratio:  1.54 Fransico Him MD Electronically signed by Fransico Him MD Signature Date/Time: 08/05/2020/9:54:06 AM    Final    CT Angio Abd/Pel w/ and/or w/o  Result Date: 08/05/2020 CLINICAL DATA:  Recurrent small-bowel obstruction.  SMA stenosis. EXAM: CTA ABDOMEN AND PELVIS WITHOUT AND WITH CONTRAST TECHNIQUE: Multidetector CT imaging of the abdomen and pelvis was performed using the standard protocol during bolus administration of intravenous contrast. Multiplanar reconstructed images and MIPs were obtained and reviewed to evaluate the vascular anatomy. CONTRAST:  140m OMNIPAQUE IOHEXOL 350 MG/ML SOLN COMPARISON:  08/04/2020 FINDINGS: VASCULAR Aorta: There are atherosclerotic changes throughout the abdominal aorta without evidence for dissection or aneurysm. Celiac: There is mild narrowing at the origin of the celiac axis. SMA: There is a replaced right hepatic artery arising from the  SMA. 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 The remaining portions of the SMA are unremarkable. Renals: There are atherosclerotic changes at the origins of both renal arteries resulting in mild stenosis. IMA: Patent without evidence of aneurysm, dissection, vasculitis or significant stenosis. Inflow: Patent without evidence of aneurysm, dissection, vasculitis or significant stenosis. Proximal Outflow: Bilateral common femoral and visualized portions of the superficial and profunda femoral arteries are patent without evidence of aneurysm, dissection, vasculitis or significant stenosis. Veins: No obvious venous abnormality within the limitations of this arterial phase study. Review of the MIP images confirms the above findings. NON-VASCULAR Lower chest: There are trace bilateral pleural effusions, right greater than left.The heart size is normal.  Hepatobiliary: The liver is normal. Normal gallbladder.There is no biliary ductal dilation. Pancreas: Normal contours without ductal dilatation. No peripancreatic fluid collection. Spleen: Unremarkable. Adrenals/Urinary Tract: --Adrenal glands: Unremarkable. --Right kidney/ureter: No hydronephrosis or radiopaque kidney stones. --Left kidney/ureter: No hydronephrosis or radiopaque kidney stones. --Urinary bladder: Unremarkable. Stomach/Bowel: --Stomach/Duodenum: No hiatal hernia or other gastric abnormality. Normal duodenal course and caliber. --Small bowel: There is no evidence for small bowel obstruction. Again noted are few loops of small bowel in the mid abdomen that demonstrate circumferential wall thickening. --Colon: Unremarkable. --Appendix: Normal. Lymphatic: --No retroperitoneal lymphadenopathy. --No mesenteric lymphadenopathy. --No pelvic or inguinal lymphadenopathy. Reproductive: Unremarkable Other: No ascites or free air. There are fat containing bilateral inguinal hernias, left greater than right. Musculoskeletal. No acute displaced fractures. IMPRESSION: 1. 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 2. Mild narrowing at the origin of the celiac axis. 3. Improving appearance of the small bowel without evidence for small bowel obstruction. There are few persistent areas of small-bowel wall thickening which may be secondary to an enteritis. Ischemic enteritis seems less likely given the robust collateral blood supply at this level in addition to a patent celiac axis and IMA. 4. Trace bilateral pleural effusions, right greater than left. 5. Fat containing bilateral inguinal hernias, left greater than right. Aortic Atherosclerosis (ICD10-I70.0). Electronically Signed   By: Constance Holster M.D.   On: 08/05/2020 17:31      Subjective: Patient denies complaints.  He reports that this is the best he has felt for a long time.  Had a BM last night.  Passing plenty of  flatus this morning.  Tolerated regular consistency diet without nausea, vomiting or abdominal pain.  States that he ambulated in the halls multiple times last night without dyspnea, chest pain, dizziness or lightheadedness.  Discharge Exam:  Vitals:   08/06/20 1429 08/06/20 1658 08/06/20 2130 08/07/20 0655  BP: 134/63 (!) 146/71 (!) 151/71 121/89  Pulse: 82 74 73 70  Resp: 16   18  Temp:   97.8 F (36.6 C) 97.9 F (36.6 C)  TempSrc:   Oral Oral  SpO2: 91%  91% (!) 86%  Weight:      Height:        General: Pt lying comfortably in bed & appears in no obvious distress.  Pleasant elderly male, moderately built and obese lying comfortably supine in bed. Cardiovascular: S1 & S2 heard, RRR, S1/S2 +. No murmurs, rubs, gallops or clicks. No JVD or pedal edema.  Telemetry personally reviewed: Sinus rhythm with occasional PVCs. Respiratory: Clear to auscultation without wheezing, rhonchi or crackles. No increased work of breathing. Abdominal:  Non distended, non tender & soft. No organomegaly or masses appreciated.  Normal bowel sounds heard. CNS: Alert and oriented. No focal deficits. Extremities: no edema, no cyanosis    The results of significant diagnostics from this hospitalization (including imaging, microbiology, ancillary and laboratory) are listed below for reference.     Microbiology: Recent Results (from the past 240 hour(s))  SARS Coronavirus 2 by RT PCR (hospital order, performed in [facility] hospital lab) Nasopharyngeal Nasopharyngeal Swab     Status: None   Collection Time: 08/04/20 12:51 PM   Specimen: Nasopharyngeal Swab  Result Value Ref Range Status   SARS Coronavirus 2 NEGATIVE NEGATIVE Final    Comment: (NOTE) SARS-CoV-2 target nucleic acids are NOT DETECTED.  The SARS-CoV-2 RNA is generally detectable in upper and lower respiratory specimens during the acute phase of infection. The lowest concentration of SARS-CoV-2 viral copies this assay can detect is  250 copies / mL. A negative result does not preclude SARS-CoV-2 infection and should not be used as the sole basis for treatment or other patient management decisions.  A negative result may occur with improper specimen collection / handling, submission of specimen other than nasopharyngeal swab, presence of viral mutation(s) within the areas targeted by this assay, and inadequate number of viral copies (<250 copies / mL). A negative result must be combined with clinical observations, patient history, and epidemiological information.  Fact Sheet for Patients:   StrictlyIdeas.no  Fact Sheet for Healthcare Providers: BankingDealers.co.za  This test is not yet approved or  cleared by the [place] FDA and has been authorized for detection and/or diagnosis of SARS-CoV-2 by FDA under an Emergency Use Authorization (EUA).  This EUA will remain in effect (meaning this test can be used) for the duration of the COVID-19 declaration under Section 564(b)(1) of the Act, 21 U.S.C. section 360bbb-3(b)(1), unless the authorization is terminated or revoked sooner.  Performed at [facility], [place] [address] [place], [place] [address]      Labs: CBC: Recent Labs  Lab 08/04/20 1118 08/05/20 0552 08/06/20 0531 08/07/20 0619  WBC 8.0 6.8 5.8 5.4  NEUTROABS 6.5  --   --  2.8  HGB 11.0* 10.7* 11.2* 11.2*  HCT 35.6* 35.7* 36.6* 36.0*  MCV 82.4 83.2 83.4 82.9  PLT 283 272 274 800    Basic Metabolic Panel: Recent Labs  Lab 08/04/20 1118 08/05/20 0552 08/06/20 0531 08/07/20 0619  NA 137 140 136 137  K 3.9 3.5 3.6 3.8  CL 103 101 100 100  CO2 21* '26 26 28  ' GLUCOSE 169* 137* 142* 147*  BUN '15 15 15 18  ' CREATININE 0.86 0.94 1.00 1.00  CALCIUM 9.1 9.0 8.9 8.9  MG  --  2.3 2.4 2.6*    Liver Function Tests: Recent Labs  Lab 08/04/20 1118 08/05/20 0552  AST 27 25  ALT 24 22  ALKPHOS 52 44  BILITOT 1.1 0.8   PROT 8.3* 7.1  ALBUMIN 4.5 3.8    CBG: Recent Labs  Lab 08/06/20 0745 08/06/20 1152 08/06/20 1638 08/06/20 2154 08/07/20 0730  GLUCAP 137* 199* 175* 193* 156*    Hgb A1c Recent Labs    08/06/20 0531  HGBA1C 6.5*     Urinalysis    Component Value Date/Time   COLORURINE YELLOW 08/04/2020 1118   [place] 08/04/2020 1118   LABSPEC 1.017 08/04/2020 1118   PHURINE 6.0 08/04/2020 1118   GLUCOSEU 50 (A) 08/04/2020 [place] 04/04/2018 1416   [place] 08/04/2020 1118   [place] 08/04/2020 1118  BILIRUBINUR n 12/26/2010 0000   KETONESUR 20 (A) 08/04/2020 1118   PROTEINUR NEGATIVE 08/04/2020 1118   UROBILINOGEN 0.2 04/04/2018 1416   NITRITE NEGATIVE 08/04/2020 1118   LEUKOCYTESUR NEGATIVE 08/04/2020 1118    I discussed in detail with patient's daughter via phone, updated care and answered all questions.  Time coordinating discharge: 40 minutes  SIGNED:  Vernell Leep, MD, [place], [facility]. [facility]  To contact the attending provider between 7A-7P or the covering provider during after hours 7P-7A, please log into the web site [email or web address] and access using universal [place] password for that web site. If you do not have the password, please call the hospital operator.

## 2020-08-07 NOTE — Progress Notes (Signed)
Physical Therapy Treatment Patient Details Name: [name] MRN: [id number] DOB: 1935-03-01 Today's Date: 08/07/2020    History of Present Illness 85 yo male admitted with SBO, dyspnea. Hx of CAD, CABG, DM, COVID, HF    PT Comments    Patient progressing steadily with acute PT. He requires supervision for bed mob and transfers with cues to deter reaching outside BOS to steady self. Pt performed stretches for back pain after standing with limited relief and c/o ongoing back pain with gait. SpO2 monitored during gait and pt's sats stable between 90-95%. He denied SOB or fatigue throughout gait. Acute PT will follow in acute setting, educated on use of SPC for steadying with gait. Anticipate no further needs.  SATURATION QUALIFICATIONS: (This note is used to comply with regulatory documentation for home oxygen)  Patient Saturations on Room Air at Rest = 90-95%  Patient Saturations on Room Air while Ambulating = 90-94%  Patient is able to maintain SpO2 saturations with mobility on RA and does not require supplemental oxygen for this.     Follow Up Recommendations  No PT follow up     Equipment Recommendations  None recommended by PT    Recommendations for Other Services       Precautions / Restrictions Precautions Precautions: Fall Restrictions Weight Bearing Restrictions: No    Mobility  Bed Mobility Overal bed mobility: Needs Assistance Bed Mobility: Supine to Sit     Supine to sit: Min guard     General bed mobility comments: HOB flat and pt taking extra time to sit up, no assist required to raise up   Transfers Overall transfer level: Needs assistance Equipment used: Rolling walker (2 wheeled) Transfers: Sit to/from Stand Sit to Stand: Supervision         General transfer comment: supervision for safety and cues to deter reaching out for furniture or walls in standing.  Ambulation/Gait Ambulation/Gait assistance: Min guard Gait Distance (Feet):  180 Feet Assistive device: None Gait Pattern/deviations: Step-through pattern;Decreased stride length;Drifts right/left Gait velocity: decr   General Gait Details: slow gait velocity, pt c/o back pain with gait, denied DOE, Pt on RA and sats range from 90-95% with gait. Pt with 2 small LOB and able to steady self without assist. Educated/encouraged to use [facility] for stability at home.   Stairs             Wheelchair Mobility    Modified Rankin (Stroke Patients Only)       Balance Overall balance assessment: Mild deficits observed, not formally tested               Cognition Arousal/Alertness: Awake/alert Behavior During Therapy: WFL for tasks assessed/performed Overall Cognitive Status: Within Functional Limits for tasks assessed                                 General Comments: pt pleasant and eager to return home      Exercises      General Comments        Pertinent Vitals/Pain Pain Assessment: No/denies pain    Home Living                      Prior Function            PT Goals (current goals can now be found in the care plan section) Acute Rehab PT Goals Patient Stated Goal: go home PT Goal Formulation:  With patient Time For Goal Achievement: 08/20/20 Potential to Achieve Goals: Good Progress towards PT goals: Progressing toward goals    Frequency    Min 3X/week      PT Plan Current plan remains appropriate    Co-evaluation              AM-PAC PT "6 Clicks" Mobility   Outcome Measure  Help needed turning from your back to your side while in a flat bed without using bedrails?: None Help needed moving from lying on your back to sitting on the side of a flat bed without using bedrails?: A Little Help needed moving to and from a bed to a chair (including a wheelchair)?: A Little Help needed standing up from a chair using your arms (e.g., wheelchair or bedside chair)?: None Help needed to walk in hospital  room?: A Little Help needed climbing 3-5 steps with a railing? : A Little 6 Click Score: 20    End of Session Equipment Utilized During Treatment: Gait belt Activity Tolerance: Patient tolerated treatment well Patient left: in chair;with call bell/phone within reach;with chair alarm set Nurse Communication: Mobility status PT Visit Diagnosis: Pain;Unsteadiness on feet (R26.81) Pain - part of body:  (back)     Time: 6144-3154 PT Time Calculation (min) (ACUTE ONLY): 18 min  Charges:  $Gait Training: 8-22 mins                     Verner Mould, DPT Acute Rehabilitation Services  Office [phone] Pager [phone]  08/07/2020 12:11 PM

## 2020-08-07 NOTE — Progress Notes (Addendum)
CC: Abdominal pain  Subjective: "I feel great."  Patient is lying in bed but, much more comfortable than yesterday no complaints at all.  He says he is tolerating a soft diet, he has had a bowel movement, he has no abdominal complaints at all.  Objective: Vital signs in last 24 hours: Temp:  [97.8 F (36.6 C)-97.9 F (36.6 C)] 97.9 F (36.6 C) (09/22 0655) Pulse Rate:  [70-82] 70 (09/22 0655) Resp:  [16-18] 18 (09/22 0655) BP: (121-151)/(56-89) 121/89 (09/22 0655) SpO2:  [86 %-91 %] 86 % (09/22 0655) Last BM Date: 08/06/20 840 p.o. 750 urine BM x1 Afebrile vital signs are stable No radiology studies K+ 3.8, magnesium 2.6 WBC 5.4   Intake/Output from previous day: 09/21 0701 - 09/22 0700 In: 840 [P.O.:840] Out: 750 [Urine:750] Intake/Output this shift: No intake/output data recorded.  General appearance: alert, cooperative and no distress GI: soft, non-tender; bowel sounds normal; no masses,  no organomegaly  Lab Results:  Recent Labs    08/06/20 0531 08/07/20 0619  WBC 5.8 5.4  HGB 11.2* 11.2*  HCT 36.6* 36.0*  PLT 274 277    BMET Recent Labs    08/06/20 0531 08/07/20 0619  NA 136 137  K 3.6 3.8  CL 100 100  CO2 26 28  GLUCOSE 142* 147*  BUN 15 18  CREATININE 1.00 1.00  CALCIUM 8.9 8.9   PT/INR No results for input(s): LABPROT, INR in the last 72 hours.  Recent Labs  Lab 08/04/20 1118 08/05/20 0552  AST 27 25  ALT 24 22  ALKPHOS 52 44  BILITOT 1.1 0.8  PROT 8.3* 7.1  ALBUMIN 4.5 3.8     Lipase     Component Value Date/Time   LIPASE 28 08/04/2020 1118     Medications:  amiodarone  200 mg Oral Daily   aspirin  81 mg Oral Daily   cholecalciferol  1,000 Units Oral BID   dextromethorphan-guaiFENesin  1 tablet Oral BID   enoxaparin (LOVENOX) injection  40 mg Subcutaneous Q24H   fluticasone  2 spray Each Nare Daily   furosemide  40 mg Oral Q M,W,F   gabapentin  100 mg Oral TID   insulin aspart  0-9 Units Subcutaneous TID WC    latanoprost  1 drop Both Eyes Daily   metoprolol succinate  25 mg Oral Daily   pantoprazole  40 mg Oral Daily   sodium chloride flush  3 mL Intravenous Q12H    Assessment/Plan Acute hypoxic respiratory failure Interstitial lung disease COVID-19 (10/2019) Suspected mild diastolic heart failure Atrial fibrillation -Eliquis/amiodarone/Toprol Hypertension Type 2 diabetes   Abdominal pain             Questionable partial small bowel obstruction vs enteritis vs small bowel ischemia Hx PSBO 11/06/15 - resolved with Bowel rest after 48 hrs             - CT angio 08/05/20: 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             Improving appearance of small bowel without evidence for small bowel obstruction.  There are a few persistent areas of small bowel wall thickening which may be secondary to enteritis.  Ischemic enteritis seem less likely with good collateral blood supply at this level in addition to patent celiac axis and IMA.  -Vascular consult Dr. Deitra Mayo: CT shows evidence of atherosclerotic disease; however these  do not appear to be flow-limiting.  Symptoms do not appear related to chronic or acute mesenteric ischemia   FEN:  Soft diet ID: None DVT: Lovenox Follow-up: TBD  Plan: Patient is tolerating soft diet and having bowel movements.  He has no abdominal pain.  There is no surgical issue currently.  Please call if we can be of further assistance.     LOS: 3 days    JENNINGS,WILLARD 08/07/2020 Please see Amion  Agree with above.  Alphonsa Overall, MD, [facility] Office phone:  [phone]

## 2020-08-07 NOTE — Progress Notes (Signed)
Nursing Discharge Summary  Patient ID: [name] MRN: [id number] DOB/AGE: 07/17/1935 85 y.o.  Admit date: 08/04/2020 Discharge date: 08/07/2020  Discharged Condition: good  Disposition: Discharge disposition: 01-Home or Self Care        Follow-up Information    [name], Evie Lacks, MD. Schedule an appointment as soon as possible for a visit in 1 week(s).   Specialty: Internal Medicine Why: To be seen with repeat labs (CBC & BMP). Contact information: [place] [address] [phone]        Fay Records, MD .   Specialty: Cardiology Contact information: [address] [place] [address] [phone]        Ladene Artist, MD. Schedule an appointment as soon as possible for a visit.   Specialty: Gastroenterology Why: MDs office will call you with appointment for CT enterography and subsequent MD follow-up. Contact information: [address] [place] [address] [phone]               Prescriptions Given: Reviewed medications with patient and follow up appointments.  Patient verbalized understanding without further questions.    Means of Discharge: Patient to be discharged home via private vehicle by son.  Patient to be taken downstairs via wheelchair.   Signed: Buel Ream 08/07/2020, 11:50 AM

## 2020-08-08 ENCOUNTER — Other Ambulatory Visit: Payer: Self-pay

## 2020-08-08 ENCOUNTER — Telehealth: Payer: Self-pay

## 2020-08-08 DIAGNOSIS — K56609 Unspecified intestinal obstruction, unspecified as to partial versus complete obstruction: Secondary | ICD-10-CM

## 2020-08-08 DIAGNOSIS — R1084 Generalized abdominal pain: Secondary | ICD-10-CM

## 2020-08-08 DIAGNOSIS — R933 Abnormal findings on diagnostic imaging of other parts of digestive tract: Secondary | ICD-10-CM

## 2020-08-08 NOTE — Progress Notes (Signed)
Cardiology Office Note   Date:  08/09/2020   ID:  [name], DOB 08-12-1935, MRN [id number]  PCP:  Cassandria Anger, MD  Cardiologist:   [name], MD   F/u of CAD and atrial fibrillaiton        History of Present Illness: [name] is a 85 y.o. male with a history ofCAD, (s/p CABG 01/2011 LIMA to LAD; SVG to Cx),diabetes, hypertension, nephrolithiasis, hyperlipidemia, and atrial fibrillaiton  He underwent cardioversion in past that [place] and then had  amiodarone load.   Converted   Went back into atrial fib   .. .  Echo in Sept 2019 LVEF est at 40 to 45%   By my review it appeared greater    Myovue in November 2019 was normal    The pt had near syncope in June 2020  Monitor showed no arrhythmias  Symptoms improved/resolved    In Dec 2020 he and his wife were hospitalized with COVID    I saw him in Feb  He complained of DOE and giving  out easier    Labs done    I recomm adding lasix 57m alternate with 20 mg  [facility]  Since d/c he says his breathing is still a little down.  Tires  He denies CP    Current Meds  Medication Sig  . Accu-Chek Softclix Lancets lancets Use to check blood sugar daily DX:E11.59  . albuterol (VENTOLIN HFA) 108 (90 Base) MCG/ACT inhaler Inhale 2 puffs into the lungs every 6 (six) hours as needed for wheezing or shortness of breath.  .Marland Kitchenamiodarone (PACERONE) 200 MG tablet TAKE 1 TABLET BY MOUTH EVERY DAY (Patient taking differently: Take 200 mg by mouth daily. )  . aspirin 81 MG tablet Take 81 mg by mouth daily.   . Blood Glucose Monitoring Suppl (ACCU-CHEK AVIVA PLUS) w/Device KIT Use to check blood sugar daily DX:E11.59  . cholecalciferol (VITAMIN D) 1000 units tablet Take 1,000 Units by mouth 2 (two) times daily.  .Marland KitchenELIQUIS 5 MG TABS tablet TAKE 1 TABLET BY MOUTH TWICE A DAY (Patient taking differently: Take 5 mg by mouth 2 (two) times daily. )  . furosemide (LASIX) 40 MG  tablet Take 1 tablet (40 mg total) by mouth as directed. Take 1 tablet every Mon, Wed, Fri  . gabapentin (NEURONTIN) 100 MG capsule TAKE 1 CAPSULE (100 MG TOTAL) BY MOUTH 3 (THREE) TIMES DAILY. OFFICE VISIT NEEDED (Patient taking differently: Take 100 mg by mouth 3 (three) times daily. )  . glucose blood (ACCU-CHEK AVIVA PLUS) test strip Use to check blood sugar daily DX:E11.59  . latanoprost (XALATAN) 0.005 % ophthalmic solution Place 1 drop into both eyes daily.  . metFORMIN (GLUCOPHAGE) 500 MG tablet Take 1 tablet (500 mg total) by mouth 3 (three) times daily. Do not take today 08/07/2020.  Start taking again from tomorrow 08/08/2020.  . metoprolol succinate (TOPROL-XL) 25 MG 24 hr tablet TAKE 1 TABLET BY MOUTH EVERY DAY (Patient taking differently: Take 25 mg by mouth daily. )  . pantoprazole (PROTONIX) 40 MG tablet TAKE 1 TABLET BY MOUTH EVERY DAY (Patient taking differently: Take 40 mg by mouth daily. )  . Propylene Glycol (SYSTANE BALANCE) 0.6 % SOLN Place 1 drop into both eyes 2 (two) times daily as needed (for dry eyes).  . repaglinide (PRANDIN) 2 MG tablet TAKE 1 TABLET BY MOUTH 3 TIMES DAILY BEFORE MEALS. (Patient taking differently: Take  2 mg by mouth 3 (three) times daily before meals. )  . simvastatin (ZOCOR) 10 MG tablet TAKE 1 TABLET BY MOUTH EVERY DAY (Patient taking differently: Take 10 mg by mouth daily at 6 PM. )  . Tiotropium Bromide-Olodaterol (STIOLTO RESPIMAT) 2.5-2.5 MCG/ACT AERS Inhale 2 puffs into the lungs daily.     Allergies:   Patient has no known allergies.   Past Medical History:  Diagnosis Date  . Acute diastolic CHF (congestive heart failure) ([place]) 08/04/2020  . Arthritis   . Atrial fibrillation ([place])    post op; amiodarone and coumadin continued for 3 mos post op  . CARCINOMA, SKIN, SQUAMOUS CELL 01/07/2010  . Cataract    beginning of cataracts  . COLONIC POLYPS, HX OF 04/26/2007  . Coronary artery disease    s/p CABG 3/12: L-LAD, S-CFX (Dr. Roxy Manns); EF 45% at  cath prior to CABG  . DIABETES MELLITUS, TYPE II 04/26/2007  . Hyperlipidemia   . HYPERTENSION 04/26/2007  . NEPHROLITHIASIS, HX OF 04/26/2007  . SBO (small bowel obstruction) ([place]) 10/2015   Partial   . Shortness of breath dyspnea    with exertion  . Unspecified hearing loss 12/18/2008    Past Surgical History:  Procedure Laterality Date  . BACK SURGERY     disectomy  . CARDIOVERSION N/A 06/08/2018   Procedure: CARDIOVERSION;  Surgeon: Buford Dresser, MD;  Location: [facility] ENDOSCOPY;  Service: Cardiovascular;  Laterality: N/A;  . CARDIOVERSION N/A 07/25/2018   Procedure: CARDIOVERSION;  Surgeon: Fay Records, MD;  Location: [place];  Service: Cardiovascular;  Laterality: N/A;  . COLONOSCOPY    . CORONARY ARTERY BYPASS GRAFT  01/12/2011  . LUMBAR LAMINECTOMY/DECOMPRESSION MICRODISCECTOMY N/A 05/08/2015   Procedure: LUMBAR LAMINECTOMY/DECOMPRESSION MICRODISCECTOMY 1 LEVEL Lumbar four five;  Surgeon: Melina Schools, MD;  Location: [place];  Service: Orthopedics;  Laterality: N/A;  . TOTAL HIP ARTHROPLASTY Left    left on 04/15/07  . TYMPANIC MEMBRANE REPAIR  1981     Social History:  The patient  reports that he quit smoking about 39 years ago. His smoking use included cigarettes. He has a 60.00 pack-year smoking history. He has never used smokeless tobacco. He reports that he does not drink alcohol and does not use drugs.   Family History:  The patient's family history includes Breast cancer in his sister; Cancer in his brother and sister; Heart disease in his brother and father; Heart failure in his mother.    ROS:  Please see the history of present illness. All other systems are reviewed and  Negative to the above problem except as noted.    PHYSICAL EXAM: VS:  BP 130/72   Pulse 78   Ht _0  (1.727 m)   Wt 225 lb 9.6 oz (102.3 kg)   SpO2 91%   BMI 34.30 kg/m   GEN:  Morbidly obese 85 yo in no acute distress  HEENT: normal  Neck: JVP is normal   Cardiac:  Regular rate  and rhytm; no murmurs No LE edema   Respiratory:  clear to auscultation bilaterally GI: soft, nontender, obese    Skin: warm and dry, no rash Neuro:  Strength and sensation are intact Psych: euthymic mood, full affect   Echo 9/21  Left ventricular ejection fraction, by estimation, is 55 to 60%. The left ventricle has normal function. The left ventricle has no regional wall motion abnormalities. Left ventricular diastolic parameters are consistent with Grade II diastolic dysfunction (pseudonormalization). Elevated left ventricular end-diastolic pressure. 2. Right  ventricular systolic function is normal. The right ventricular size is normal. Tricuspid regurgitation signal is inadequate for assessing PA pressure. 3. Left atrial size was mildly dilated. 4. The mitral valve is normal in structure. Trivial mitral valve regurgitation. No evidence of mitral stenosis. Moderate mitral annular calcification. 5. The aortic valve was not well visualized. Aortic valve regurgitation is not visualized. Mild aortic valve sclerosis is present, with no evidence of aortic valve stenosis. 6. The inferior vena cava is normal in size with greater than 50% respiratory variability, suggesting right atrial pressure of 3 mmHg.  EKG:  EKG Is ordered today   SR 73 bpm  First degree AV block  PR 236 msec    Lipid Panel    Component Value Date/Time   CHOL 118 12/29/2018 1151   TRIG 130 10/24/2019 2027   TRIG 78 11/05/2006 1123   HDL 51 12/29/2018 1151   CHOLHDL 2.3 12/29/2018 1151   CHOLHDL 2.5 01/17/2016 1209   VLDL 20 01/17/2016 1209   LDLCALC 47 12/29/2018 1151      Wt Readings from Last 3 Encounters:  08/09/20 225 lb 9.6 oz (102.3 kg)  08/05/20 225 lb 5 oz (102.2 kg)  07/31/20 234 lb (106.1 kg)      ASSESSMENT AND PLAN:  1 Atrial fibrillation   REmainis in SR on amiodarone. Currently on 200 mg and Eliquis   Continue for now    2  Dyspnea. Continues to complain of dyspnea   COVID did not help   ? If related to amiodarone   Would check TSH and BNP and ESR   If amio involved then ESR should be elevated   2  CAD   Pt s/p CABG in 2012 Myovue scan in November 2019 was normal.  I am not convinced of active angina   3  HTN   BP is controlled  Keep on same meds    4   HL Continue simvastatin  LDL in Feb 2020 ws 29    5  Hx of d izziness  Pt denies   6 Morbid obesity   Wt remains elevated   Watch diet   Stay active    Follow-up end ofJan / Feb     Current medicines are reviewed at length with the patient today.  The patient does not have concerns regarding medicines.  Signed, [name], MD  08/09/2020 1:42 PM    [facility] [place], [place], [place]  [address] Phone: [phone]; Fax: [phone]

## 2020-08-08 NOTE — Telephone Encounter (Signed)
Patient is scheduled for his CT enterography at [facility] in Radiology on 08/20/20. The appointment is at 3:00 pm. He must begin fasting at 11:00 am. He must arrive at 1:30 pm for the test. He will be given his solution to drink at that time. Patient is scheduled for his hospital follow up appointment with Dr Fuller Plan on 08/28/20 at 10:50 am. Called the patient. No answer. Left a message asking for a call back about these appointments.

## 2020-08-09 ENCOUNTER — Other Ambulatory Visit: Payer: Self-pay

## 2020-08-09 ENCOUNTER — Encounter: Payer: Self-pay | Admitting: Internal Medicine

## 2020-08-09 ENCOUNTER — Ambulatory Visit: Payer: Medicare Other | Admitting: Internal Medicine

## 2020-08-09 VITALS — BP 130/72 | HR 78 | Ht 68.0 in | Wt 225.6 lb

## 2020-08-09 DIAGNOSIS — R0602 Shortness of breath: Secondary | ICD-10-CM | POA: Diagnosis not present

## 2020-08-09 DIAGNOSIS — I480 Paroxysmal atrial fibrillation: Secondary | ICD-10-CM | POA: Diagnosis not present

## 2020-08-09 DIAGNOSIS — E782 Mixed hyperlipidemia: Secondary | ICD-10-CM | POA: Diagnosis not present

## 2020-08-09 DIAGNOSIS — I2510 Atherosclerotic heart disease of native coronary artery without angina pectoris: Secondary | ICD-10-CM | POA: Diagnosis not present

## 2020-08-09 NOTE — Patient Instructions (Signed)
Medication Instructions:  No changes *If you need a refill on your cardiac medications before your next appointment, please call your pharmacy*   Lab Work: Today: lipids, tsh, bnp, sed rate  If you have labs (blood work) drawn today and your tests are completely normal, you will receive your results only by: Marland Kitchen MyChart Message (if you have MyChart) OR . A paper copy in the mail If you have any lab test that is abnormal or we need to change your treatment, we will call you to review the results.   Testing/Procedures: none   Follow-Up: At [facility], you and your health needs are our priority.  As part of our continuing mission to provide you with exceptional heart care, we have created designated Provider Care Teams.  These Care Teams include your primary Cardiologist (physician) and Advanced Practice Providers (APPs -  Physician Assistants and Nurse Practitioners) who all work together to provide you with the care you need, when you need it.    Your next appointment:   4 month(s)  The format for your next appointment:   In Person  Provider:   You may see [name], MD or one of the following Advanced Practice Providers on your designated Care Team:    Richardson Dopp, PA-C  Robbie Lis, [place]    Other Instructions

## 2020-08-10 LAB — LIPID PANEL
Chol/HDL Ratio: 4.2 ratio (ref 0.0–5.0)
Cholesterol, Total: 126 mg/dL (ref 100–199)
HDL: 30 mg/dL — ABNORMAL LOW (ref >39)
LDL Chol Calc (NIH): 70 mg/dL (ref 0–99)
Triglycerides: 149 mg/dL (ref 0–149)
VLDL Cholesterol Cal: 26 mg/dL (ref 5–40)

## 2020-08-10 LAB — PRO B NATRIURETIC PEPTIDE: NT-Pro BNP: 365 pg/mL (ref 0–486)

## 2020-08-10 LAB — TSH: TSH: 3.74 u[IU]/mL (ref 0.450–4.500)

## 2020-08-10 LAB — SEDIMENTATION RATE: Sed Rate: 58 mm/hr — ABNORMAL HIGH (ref 0–30)

## 2020-08-12 NOTE — Telephone Encounter (Signed)
Patient is returning your call.  

## 2020-08-12 NOTE — Telephone Encounter (Signed)
Spoke with the patient. He agrees to the appointments as stated.

## 2020-08-12 NOTE — Telephone Encounter (Signed)
Left a message to call back regarding these appointments.

## 2020-08-13 DIAGNOSIS — D6869 Other thrombophilia: Secondary | ICD-10-CM | POA: Diagnosis not present

## 2020-08-13 DIAGNOSIS — M5136 Other intervertebral disc degeneration, lumbar region: Secondary | ICD-10-CM | POA: Diagnosis not present

## 2020-08-13 DIAGNOSIS — I482 Chronic atrial fibrillation, unspecified: Secondary | ICD-10-CM | POA: Diagnosis not present

## 2020-08-13 DIAGNOSIS — I4891 Unspecified atrial fibrillation: Secondary | ICD-10-CM | POA: Diagnosis not present

## 2020-08-13 DIAGNOSIS — M48061 Spinal stenosis, lumbar region without neurogenic claudication: Secondary | ICD-10-CM | POA: Diagnosis not present

## 2020-08-15 ENCOUNTER — Other Ambulatory Visit: Payer: Self-pay

## 2020-08-15 ENCOUNTER — Ambulatory Visit (INDEPENDENT_AMBULATORY_CARE_PROVIDER_SITE_OTHER): Payer: Medicare Other | Admitting: Internal Medicine

## 2020-08-15 ENCOUNTER — Encounter: Payer: Self-pay | Admitting: Internal Medicine

## 2020-08-15 DIAGNOSIS — I2510 Atherosclerotic heart disease of native coronary artery without angina pectoris: Secondary | ICD-10-CM | POA: Diagnosis not present

## 2020-08-15 DIAGNOSIS — J849 Interstitial pulmonary disease, unspecified: Secondary | ICD-10-CM

## 2020-08-15 DIAGNOSIS — I10 Essential (primary) hypertension: Secondary | ICD-10-CM

## 2020-08-15 DIAGNOSIS — I152 Hypertension secondary to endocrine disorders: Secondary | ICD-10-CM

## 2020-08-15 DIAGNOSIS — E1159 Type 2 diabetes mellitus with other circulatory complications: Secondary | ICD-10-CM

## 2020-08-15 DIAGNOSIS — I480 Paroxysmal atrial fibrillation: Secondary | ICD-10-CM | POA: Diagnosis not present

## 2020-08-15 NOTE — Assessment & Plan Note (Signed)
On Metformin, Prandin CBGs 130s at home

## 2020-08-15 NOTE — Assessment & Plan Note (Signed)
Better. Less SOB

## 2020-08-15 NOTE — Progress Notes (Signed)
Subjective:  Patient ID: [name], male    DOB: Jul 13, 1935  Age: 85 y.o. MRN: [id number]  CC: No chief complaint on file.   HPI [name] presents for CAD, SBO, CHF f/u.  He is doing better F/u hosp stay --   Per hx: " Admit date: 08/04/2020 Discharge date: 08/07/2020  Recommendations for Outpatient Follow-up:        Follow-up Information        Michoel Kunin, Evie Lacks, MD. Schedule an appointment as soon as possible for a visit in 1 week(s).   Specialty: Internal Medicine Why: To be seen with repeat labs (CBC & BMP). Contact information: [place] [address] [phone]             Fay Records, MD .   Specialty: Cardiology Contact information: [address] [place] [address] [phone]             Ladene Artist, MD. Schedule an appointment as soon as possible for a visit.   Specialty: Gastroenterology Why: MDs office will call you with appointment for CT enterography and subsequent MD follow-up. Contact information: [address] [place] [address] [phone]                 Home Health: None Equipment/Devices: None  Discharge Condition: Improved and stable CODE STATUS: Partial code/DNI Diet recommendation: Low residue heart healthy and diabetic diet.  Discharge Diagnoses:  Principal Problem:   SBO (small bowel obstruction) (HCC) Active Problems:   Diabetes mellitus, type 2 (HCC)   Paroxysmal atrial fibrillation (HCC)   Abdominal pain   Acute respiratory failure with hypoxia (HCC)   Acute diastolic CHF (congestive heart failure) (HCC)   Essential hypertension   Brief Summary: 85 year old male, lives with family and independent, PMH of type II DM, hypertension, hyperlipidemia, CAD s/p CABG, atrial fibrillation on Eliquis, partial SBO, ILD, COVID-19 (December 2020), prior chronic systolic CHF (last EF 73-42% in 2019) who presented to the ED with  complaints of gradual onset, constant waxing and waning periumbilical abdominal pain of 12 hours duration with associated nausea, similar to prior presentation with SBO.  He had normal BM day prior to admission.  He was still passing flatus.  He describes some dyspnea.  Since he had COVID-19 in December 2020, had been having dyspnea which reportedly was worse recently and was sent to a pulmonologist.  He had a CT chest on 7/22 which showed fibrotic ILD with slight basilar predominance categorized as probable UIP.  He saw Dr. Lamonte Sakai on 9/15 who felt this was probably from his COVID-19 and had PFTs at that time which showed some restriction with superimposed obstruction and was prescribed albuterol.  ED Course:Afebrile, hypertensive and tachypneic with SPO2 87% on room air requiring 2 L. Labs: CO2 21, glucose 169, BNP 475, troponin 21->25, WBC 8.0, Hb 11.0, UA with ketones. CXR with findings suggestive of mild CHF. CT abdomen pelvis: Findings suggestive of chronic ischemia VS. Sequelae of inflammatory bowel disease and relatively low-grade obstruction.   Assessment and plan:  Recurrent partial small bowel obstruction: General surgery and [facility] consulted and assisted with evaluation and management.  Patient was treated conservatively with bowel rest/n.p.o., NG tube and IV fluids.  CT abdomen and pelvis showed thickening of right-sided colon and findings suggestive of chronic ischemia as well as low-grade obstruction.  He was started on clear liquid diet and diet was gradually advanced.  He has tolerated regular  consistency diet.  He has had a BM last night.  Passing flatus today.  GI obtained CTA of abdomen to further assess mesenteric vasculature which showed high-grade stenosis at the origin of the SMA, mild narrowing origin of the celiac axis, IMA patent without evidence of stenosis, improving appearance of small bowel without evidence of small bowel obstruction, few persistent areas of small  bowel wall thickening which may be secondary to enteritis.  Ischemic enteritis seemed less likely with good collateral blood supply at this level in addition to patent celiac axis and IMA.  As per GI recommendation, vascular surgery was consulted.  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  However they indicated that these do not appear to be flow-limiting and they did not think that his symptoms can be attributed to chronic mesenteric ischemia or acute mesenteric ischemia.  Thus at this point they did not think any further vascular work-up was indicated.  He is already on aspirin and on statin.  He quit smoking 40 years ago.  GI and general surgery evaluated today and have cleared him for discharge.  GI indicate that the etiology of the mid small bowel circumferential thickening noted on imaging is not clear.  He has had no prior abdominal surgery-question internal hernia, the doubt IBD but cannot be ruled out.  GI will arrange for CT enterography as outpatient and for outpatient follow-up.  Acute hypoxic respiratory failure-multifactorial due to ILD complicating recent [id number] illness, suspected mild acute on chronic diastolic CHF: Above likely exacerbated by inconsistent use of home Lasix.  On room air at home.  On admission required 2 L/min nasal cannula oxygen.  TTE showed LVEF of 55-60% without wall motion abnormalities and grade 2 diastolic dysfunction.  He was treated with couple of doses of IV Lasix in the hospital.  He has diuresed well and clinically appears euvolemic.  Continue prior home dose of 3 times per week oral Lasix.  Discussed with patient and his daughter regarding importance of compliance with all aspects of his medical care.  They verbalized understanding.  Patient has a follow-up appointment with his primary cardiologist/Dr. Harrington Challenger on  9/24.  Hypoxia resolved as per RN based on desaturation protocol prior to discharge.  Paroxysmal atrial fibrillation: CHA2DS2VASC score at least 5 (age, hypertension, vascular disease, diabetes).  While patient was n.p.o., he was temporarily on IV Lasix and Eliquis was on hold in case he needed any procedures.  Continue prior home dose of Toprol-XL, amiodarone and Eliquis at DC.  Of note patient also is on aspirin 81 mg daily PTA which obviously increases his bleeding risk while on Eliquis.  Unclear why he is on both these and this can be addressed during close follow-up with cardiology on 9/24 if he needs to continue aspirin.  Essential hypertension: Controlled.  Continue Toprol-XL.  Dyslipidemia: Continue statins."   Outpatient Medications Prior to Visit  Medication Sig Dispense Refill  . Accu-Chek Softclix Lancets lancets Use to check blood sugar daily DX:E11.59 100 each 3  . albuterol (VENTOLIN HFA) 108 (90 Base) MCG/ACT inhaler Inhale 2 puffs into the lungs every 6 (six) hours as needed for wheezing or shortness of breath. 8 g 2  . amiodarone (PACERONE) 200 MG tablet TAKE 1 TABLET BY MOUTH EVERY DAY (Patient taking differently: Take 200 mg by mouth daily. ) 90 tablet 2  . aspirin 81 MG tablet Take  81 mg by mouth daily.     . Blood Glucose Monitoring Suppl (ACCU-CHEK AVIVA PLUS) w/Device KIT Use to check blood sugar daily DX:E11.59 1 kit 0  . cholecalciferol (VITAMIN D) 1000 units tablet Take 1,000 Units by mouth 2 (two) times daily.    Marland Kitchen ELIQUIS 5 MG TABS tablet TAKE 1 TABLET BY MOUTH TWICE A DAY (Patient taking differently: Take 5 mg by mouth 2 (two) times daily. ) 60 tablet 5  . furosemide (LASIX) 40 MG tablet Take 1 tablet (40 mg total) by mouth as directed. Take 1 tablet every Mon, Wed, Fri 90 tablet 3  . gabapentin (NEURONTIN) 100 MG capsule TAKE 1 CAPSULE (100 MG TOTAL) BY MOUTH 3 (THREE) TIMES DAILY. OFFICE VISIT NEEDED (Patient taking differently: Take 100 mg by mouth 3 (three)  times daily. ) 270 capsule 3  . glucose blood (ACCU-CHEK AVIVA PLUS) test strip Use to check blood sugar daily DX:E11.59 100 each 3  . latanoprost (XALATAN) 0.005 % ophthalmic solution Place 1 drop into both eyes daily.    . metFORMIN (GLUCOPHAGE) 500 MG tablet Take 1 tablet (500 mg total) by mouth 3 (three) times daily. Do not take today 08/07/2020.  Start taking again from tomorrow 08/08/2020.    . metoprolol succinate (TOPROL-XL) 25 MG 24 hr tablet TAKE 1 TABLET BY MOUTH EVERY DAY (Patient taking differently: Take 25 mg by mouth daily. ) 90 tablet 2  . pantoprazole (PROTONIX) 40 MG tablet TAKE 1 TABLET BY MOUTH EVERY DAY (Patient taking differently: Take 40 mg by mouth daily. ) 90 tablet 3  . Propylene Glycol (SYSTANE BALANCE) 0.6 % SOLN Place 1 drop into both eyes 2 (two) times daily as needed (for dry eyes).    . repaglinide (PRANDIN) 2 MG tablet TAKE 1 TABLET BY MOUTH 3 TIMES DAILY BEFORE MEALS. (Patient taking differently: Take 2 mg by mouth 3 (three) times daily before meals. ) 270 tablet 3  . simvastatin (ZOCOR) 10 MG tablet TAKE 1 TABLET BY MOUTH EVERY DAY (Patient taking differently: Take 10 mg by mouth daily at 6 PM. ) 90 tablet 3  . Tiotropium Bromide-Olodaterol (STIOLTO RESPIMAT) 2.5-2.5 MCG/ACT AERS Inhale 2 puffs into the lungs daily. (Patient not taking: Reported on 08/15/2020) 4 g 0   No facility-administered medications prior to visit.    ROS: Review of Systems  Constitutional: Positive for unexpected weight change. Negative for appetite change and fatigue.  HENT: Negative for congestion, nosebleeds, sneezing, sore throat and trouble swallowing.   Eyes: Negative for itching and visual disturbance.  Respiratory: Negative for cough.   Cardiovascular: Negative for chest pain, palpitations and leg swelling.  Gastrointestinal: Negative for abdominal distention, blood in stool, diarrhea and nausea.  Genitourinary: Negative for frequency and hematuria.  Musculoskeletal: Positive for  back pain and gait problem. Negative for joint swelling and neck pain.  Skin: Negative for rash.  Neurological: Negative for dizziness, tremors, speech difficulty and weakness.  Psychiatric/Behavioral: Negative for agitation, dysphoric mood and sleep disturbance. The patient is not nervous/anxious.     Objective:  BP (!) 142/68 (BP Location: Right Arm, Patient Position: Sitting, Cuff Size: Large)   Pulse 75   Temp 98 F (36.7 C) (Oral)   Ht '5\' 8"'  (1.727 m)   Wt 229 lb (103.9 kg)   SpO2 96%   BMI 34.82 kg/m   BP Readings from Last 3 Encounters:  08/15/20 (!) 142/68  08/09/20 130/72  08/07/20 121/89    Wt Readings from Last 3 Encounters:  08/15/20 229 lb (103.9 kg)  08/09/20 225 lb 9.6 oz (102.3 kg)  08/05/20 225 lb 5 oz (102.2 kg)    Physical Exam Constitutional:      General: He is not in acute distress.    Appearance: He is well-developed.     Comments: NAD  Eyes:     Conjunctiva/sclera: Conjunctivae normal.     Pupils: Pupils are equal, round, and reactive to light.  Neck:     Thyroid: No thyromegaly.     Vascular: No JVD.  Cardiovascular:     Rate and Rhythm: Normal rate and regular rhythm.     Heart sounds: Normal heart sounds. No murmur heard.  No friction rub. No gallop.   Pulmonary:     Effort: Pulmonary effort is normal. No respiratory distress.     Breath sounds: Normal breath sounds. No wheezing or rales.  Chest:     Chest wall: No tenderness.  Abdominal:     General: Bowel sounds are normal. There is no distension.     Palpations: Abdomen is soft. There is no mass.     Tenderness: There is no abdominal tenderness. There is no guarding or rebound.  Musculoskeletal:        General: No tenderness. Normal range of motion.     Cervical back: Normal range of motion.  Lymphadenopathy:     Cervical: No cervical adenopathy.  Skin:    General: Skin is warm and dry.     Findings: No rash.  Neurological:     Mental Status: He is alert and oriented to  person, place, and time.     Cranial Nerves: No cranial nerve deficit.     Motor: No abnormal muscle tone.     Coordination: Coordination normal.     Gait: Gait normal.     Deep Tendon Reflexes: Reflexes are normal and symmetric.  Psychiatric:        Behavior: Behavior normal.        Thought Content: Thought content normal.        Judgment: Judgment normal.     Lab Results  Component Value Date   WBC 5.4 08/07/2020   HGB 11.2 (L) 08/07/2020   HCT 36.0 (L) 08/07/2020   PLT 277 08/07/2020   GLUCOSE 147 (H) 08/07/2020   CHOL 126 08/09/2020   TRIG 149 08/09/2020   HDL 30 (L) 08/09/2020   LDLCALC 70 08/09/2020   ALT 22 08/05/2020   AST 25 08/05/2020   NA 137 08/07/2020   K 3.8 08/07/2020   CL 100 08/07/2020   CREATININE 1.00 08/07/2020   BUN 18 08/07/2020   CO2 28 08/07/2020   TSH 3.740 08/09/2020   PSA 1.18 07/02/2017   INR 2.3 03/03/2012   HGBA1C 6.5 (H) 08/06/2020   MICROALBUR 0.7 04/04/2018    DG Chest 2 View  Result Date: 08/04/2020 CLINICAL DATA:  Shortness of breath. EXAM: CHEST - 2 VIEW COMPARISON:  10/24/2019 FINDINGS: Previous median sternotomy and CABG procedure. Stable cardiac enlargement. Small right pleural effusion and diffuse pulmonary vascular congestion identified. No airspace consolidation. IMPRESSION: Suspect mild CHF. Electronically Signed   By: Kerby Moors M.D.   On: 08/04/2020 08:26   DG Abd 1 View  Result Date: 08/05/2020 CLINICAL DATA:  Follow-up small bowel obstruction EXAM: ABDOMEN - 1 VIEW COMPARISON:  Yesterday FINDINGS: Negative for obstructive pattern or over distended bowel. No concerning mass effect or gas collection. Moderate distension of the bladder by contrast. Left hip arthroplasty. IMPRESSION: No gas  dilated bowel. Moderate distension of the bladder. Electronically Signed   By: Monte Fantasia M.D.   On: 08/05/2020 07:43   DG Abdomen 1 View  Result Date: 08/04/2020 CLINICAL DATA:  NG tube placement EXAM: ABDOMEN - 1 VIEW  COMPARISON:  None. FINDINGS: Esophagogastric tube is positioned with tip and side-port at the level of the gastropathy or junction. Recommend slight advancement to ensure subdiaphragmatic placement of tip and side port. Gas-filled, although nondistended bowel throughout the included abdomen and pelvis. IMPRESSION: Esophagogastric tube is positioned with tip and side-port at the level of the gastropathy or junction. Recommend slight advancement to ensure subdiaphragmatic placement of tip and side port. Electronically Signed   By: Eddie Candle M.D.   On: 08/04/2020 16:40   CT Abdomen Pelvis W Contrast  Result Date: 08/04/2020 CLINICAL DATA:  Bowel obstruction suspected.  Pain and swelling. EXAM: CT ABDOMEN AND PELVIS WITH CONTRAST TECHNIQUE: Multidetector CT imaging of the abdomen and pelvis was performed using the standard protocol following bolus administration of intravenous contrast. CONTRAST:  110m OMNIPAQUE IOHEXOL 300 MG/ML  SOLN COMPARISON:  January 29, 2019 FINDINGS: Lower chest: Small bilateral pleural effusions are identified, right greater than left. No nodules or masses. Reticular changes in the periphery of the lung were also seen in March of 2020, likely interstitial lung disease. No evidence of pneumonia. No other abnormalities in the lower chest. Hepatobiliary: No focal liver abnormality is seen. No gallstones, gallbladder wall thickening, or biliary dilatation. Pancreas: Unremarkable. No pancreatic ductal dilatation or surrounding inflammatory changes. Spleen: Normal in size without focal abnormality. Adrenals/Urinary Tract: Adrenal glands are unremarkable. Kidneys are normal, without renal calculi, focal lesion, or hydronephrosis. Bladder is unremarkable. Stomach/Bowel: The stomach is normal. The proximal small bowel is normal. There is a segment of ileum with wall thickening best seen on coronal images 37 through 47 in the right side of the abdomen. There is fecal material within this  segmentally thickened loop of bowel. There is a relative transition point associated on coronal image 34. Proximal to the transition point is a mildly prominent loop of small bowel containing fecal material. Small bowel remains mildly prominent over at least 30 cm proximal to the transition point. The colon and appendix are normal. Vascular/Lymphatic: Calcified atherosclerosis is seen in the abdominal aorta and at the origins of the branching vessels. There is dense atherosclerosis at the origin of the SMA in particular. Atherosclerotic change extends into the iliac and femoral vessels. No adenopathy. Reproductive: Prostate is unremarkable. Other: No abdominal wall hernia or abnormality. No abdominopelvic ascites. Musculoskeletal: The patient is status post left hip replacement. Degenerative changes are seen in the lumbar spine. IMPRESSION: 1. There is a segment of ileum in the right side of the abdomen which is relatively thick walled as described above. Just proximal to this segmentally thickened loop is at least 30-40 cm of mildly prominent small bowel which contains fecal material and is larger in caliber than the remainder of the small bowel. The segmentally thickened loop of ileum may be secondary to chronic ischemia given the severe atherosclerotic change at the SMA origin. Sequela of inflammatory bowel disease is also possibility. Recommend consultation with gastroenterology. The segmentally thickened loop results in slowed transit and a relatively low-grade obstruction. 2. Atherosclerotic change in the abdominal aorta and branching vessels. 3. Small bilateral pleural effusions. 4. Probable interstitial lung disease in the lung bases. Electronically Signed   By: DDorise BullionIII M.D   On: 08/04/2020 13:56   ECHOCARDIOGRAM COMPLETE  Result Date: 08/05/2020    ECHOCARDIOGRAM REPORT   Patient Name:   JEWELL RYANS Date of Exam: 08/05/2020 Medical Rec #:  [id number]           Height:       68.0 in  Accession #:    [id number]          Weight:       220.9 lb Date of Birth:  [birth year]           BSA:          2.132 m Patient Age:    47 years            BP:           135/74 mmHg Patient Gender: M                   HR:           81 bpm. Exam Location:  Inpatient Procedure: 2D Echo and Intracardiac Opacification Agent Indications:    CHF- Acute Diastolic W29.56  History:        Patient has prior history of Echocardiogram examinations, most                 recent 08/12/2018. CAD, Prior CABG, Arrythmias:Atrial                 Fibrillation; Risk Factors:Hypertension, Dyslipidemia and                 Diabetes.  Sonographer:    Mikki Santee RDCS (AE) Referring Phys: [id number] [place] E SEGAL IMPRESSIONS  1. Left ventricular ejection fraction, by estimation, is 55 to 60%. The left ventricle has normal function. The left ventricle has no regional wall motion abnormalities. Left ventricular diastolic parameters are consistent with Grade II diastolic dysfunction (pseudonormalization). Elevated left ventricular end-diastolic pressure.  2. Right ventricular systolic function is normal. The right ventricular size is normal. Tricuspid regurgitation signal is inadequate for assessing PA pressure.  3. Left atrial size was mildly dilated.  4. The mitral valve is normal in structure. Trivial mitral valve regurgitation. No evidence of mitral stenosis. Moderate mitral annular calcification.  5. The aortic valve was not well visualized. Aortic valve regurgitation is not visualized. Mild aortic valve sclerosis is present, with no evidence of aortic valve stenosis.  6. The inferior vena cava is normal in size with greater than 50% respiratory variability, suggesting right atrial pressure of 3 mmHg. FINDINGS  Left Ventricle: Left ventricular ejection fraction, by estimation, is 55 to 60%. The left ventricle has normal function. The left ventricle has no regional wall motion abnormalities. Definity contrast agent was given IV to delineate  the left ventricular  endocardial borders. The left ventricular internal cavity size was normal in size. There is no left ventricular hypertrophy. Left ventricular diastolic parameters are consistent with Grade II diastolic dysfunction (pseudonormalization). Elevated left ventricular end-diastolic pressure. Right Ventricle: The right ventricular size is normal. No increase in right ventricular wall thickness. Right ventricular systolic function is normal. Tricuspid regurgitation signal is inadequate for assessing PA pressure. Left Atrium: Left atrial size was mildly dilated. Right Atrium: Right atrial size was normal in size. Pericardium: There is no evidence of pericardial effusion. Mitral Valve: The mitral valve is normal in structure. Moderate mitral annular calcification. Trivial mitral valve regurgitation. No evidence of mitral valve stenosis. Tricuspid Valve: The tricuspid valve is normal in structure. Tricuspid valve regurgitation is trivial. No evidence of tricuspid stenosis. Aortic Valve: The aortic  valve was not well visualized. Aortic valve regurgitation is not visualized. Mild aortic valve sclerosis is present, with no evidence of aortic valve stenosis. Pulmonic Valve: The pulmonic valve was normal in structure. Pulmonic valve regurgitation is not visualized. No evidence of pulmonic stenosis. Aorta: The aortic root is normal in size and structure. Venous: The inferior vena cava is normal in size with greater than 50% respiratory variability, suggesting right atrial pressure of 3 mmHg. IAS/Shunts: No atrial level shunt detected by color flow Doppler.  LEFT VENTRICLE PLAX 2D LVIDd:         4.20 cm  Diastology LVIDs:         3.10 cm  LV e' medial:    3.83 cm/s LV PW:         1.00 cm  LV E/e' medial:  34.5 LV IVS:        1.00 cm  LV e' lateral:   7.35 cm/s LVOT diam:     2.40 cm  LV E/e' lateral: 18.0 LV SV:         94 LV SV Index:   44 LVOT Area:     4.52 cm  RIGHT VENTRICLE TAPSE (M-mode): 0.9 cm LEFT  ATRIUM              Index       RIGHT ATRIUM           Index LA diam:        3.60 cm  1.69 cm/m  RA Area:     18.30 cm LA Vol (A2C):   108.0 ml 50.66 ml/m RA Volume:   47.70 ml  22.37 ml/m LA Vol (A4C):   50.4 ml  23.64 ml/m LA Biplane Vol: 76.1 ml  35.70 ml/m  AORTIC VALVE LVOT Vmax:   79.20 cm/s LVOT Vmean:  56.000 cm/s LVOT VTI:    0.207 m  AORTA Ao Root diam: 3.50 cm MITRAL VALVE MV Area (PHT): 4.31 cm     SHUNTS MV Decel Time: 176 msec     Systemic VTI:  0.21 m MV E velocity: 132.00 cm/s  Systemic Diam: 2.40 cm MV A velocity: 85.90 cm/s MV E/A ratio:  1.54 Fransico Him MD Electronically signed by Fransico Him MD Signature Date/Time: 08/05/2020/9:54:06 AM    Final    CT Angio Abd/Pel w/ and/or w/o  Result Date: 08/05/2020 CLINICAL DATA:  Recurrent small-bowel obstruction.  SMA stenosis. EXAM: CTA ABDOMEN AND PELVIS WITHOUT AND WITH CONTRAST TECHNIQUE: Multidetector CT imaging of the abdomen and pelvis was performed using the standard protocol during bolus administration of intravenous contrast. Multiplanar reconstructed images and MIPs were obtained and reviewed to evaluate the vascular anatomy. CONTRAST:  155m OMNIPAQUE IOHEXOL 350 MG/ML SOLN COMPARISON:  08/04/2020 FINDINGS: VASCULAR Aorta: There are atherosclerotic changes throughout the abdominal aorta without evidence for dissection or aneurysm. Celiac: There is mild narrowing at the origin of the celiac axis. SMA: There is a replaced right hepatic artery arising from the SMA. 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 The remaining portions of the SMA are unremarkable. Renals: There are atherosclerotic changes at the origins of both renal arteries resulting in mild stenosis. IMA: Patent without evidence of aneurysm, dissection, vasculitis or significant stenosis. Inflow: Patent without evidence of aneurysm, dissection, vasculitis or significant stenosis. Proximal Outflow: Bilateral  common femoral and visualized portions of the superficial and profunda femoral arteries are patent without evidence of aneurysm, dissection, vasculitis  or significant stenosis. Veins: No obvious venous abnormality within the limitations of this arterial phase study. Review of the MIP images confirms the above findings. NON-VASCULAR Lower chest: There are trace bilateral pleural effusions, right greater than left.The heart size is normal. Hepatobiliary: The liver is normal. Normal gallbladder.There is no biliary ductal dilation. Pancreas: Normal contours without ductal dilatation. No peripancreatic fluid collection. Spleen: Unremarkable. Adrenals/Urinary Tract: --Adrenal glands: Unremarkable. --Right kidney/ureter: No hydronephrosis or radiopaque kidney stones. --Left kidney/ureter: No hydronephrosis or radiopaque kidney stones. --Urinary bladder: Unremarkable. Stomach/Bowel: --Stomach/Duodenum: No hiatal hernia or other gastric abnormality. Normal duodenal course and caliber. --Small bowel: There is no evidence for small bowel obstruction. Again noted are few loops of small bowel in the mid abdomen that demonstrate circumferential wall thickening. --Colon: Unremarkable. --Appendix: Normal. Lymphatic: --No retroperitoneal lymphadenopathy. --No mesenteric lymphadenopathy. --No pelvic or inguinal lymphadenopathy. Reproductive: Unremarkable Other: No ascites or free air. There are fat containing bilateral inguinal hernias, left greater than right. Musculoskeletal. No acute displaced fractures. IMPRESSION: 1. 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 2. Mild narrowing at the origin of the celiac axis. 3. Improving appearance of the small bowel without evidence for small bowel obstruction. There are few persistent areas of small-bowel wall thickening which may be secondary to an enteritis. Ischemic enteritis seems less likely given the robust collateral blood supply at this level in  addition to a patent celiac axis and IMA. 4. Trace bilateral pleural effusions, right greater than left. 5. Fat containing bilateral inguinal hernias, left greater than right. Aortic Atherosclerosis (ICD10-I70.0). Electronically Signed   By: Constance Holster M.D.   On: 08/05/2020 17:31    Assessment & Plan:    Walker Kehr, MD

## 2020-08-15 NOTE — Assessment & Plan Note (Signed)
On Metoprolol 

## 2020-08-15 NOTE — Assessment & Plan Note (Signed)
ASA, Amlodipine, Lopressor, Zocor F/u w/Dr Harrington Challenger

## 2020-08-15 NOTE — Assessment & Plan Note (Signed)
Lopressor, daily baby aspirin, amiodarone, Eliquis

## 2020-08-16 ENCOUNTER — Telehealth: Payer: Self-pay

## 2020-08-16 DIAGNOSIS — Z79899 Other long term (current) drug therapy: Secondary | ICD-10-CM

## 2020-08-16 LAB — CBC WITH DIFFERENTIAL/PLATELET
Absolute Monocytes: 360 cells/uL (ref 200–950)
Basophils Absolute: 10 cells/uL (ref 0–200)
Basophils Relative: 0.1 %
Eosinophils Absolute: 0 cells/uL — ABNORMAL LOW (ref 15–500)
Eosinophils Relative: 0 %
HCT: 35.3 % — ABNORMAL LOW (ref 38.5–50.0)
Hemoglobin: 10.8 g/dL — ABNORMAL LOW (ref 13.2–17.1)
Lymphs Abs: 910 cells/uL (ref 850–3900)
MCH: 25.2 pg — ABNORMAL LOW (ref 27.0–33.0)
MCHC: 30.6 g/dL — ABNORMAL LOW (ref 32.0–36.0)
MCV: 82.3 fL (ref 80.0–100.0)
MPV: 9.9 fL (ref 7.5–12.5)
Monocytes Relative: 3.6 %
Neutro Abs: 8720 cells/uL — ABNORMAL HIGH (ref 1500–7800)
Neutrophils Relative %: 87.2 %
Platelets: 364 10*3/uL (ref 140–400)
RBC: 4.29 10*6/uL (ref 4.20–5.80)
RDW: 15.2 % — ABNORMAL HIGH (ref 11.0–15.0)
Total Lymphocyte: 9.1 %
WBC: 10 10*3/uL (ref 3.8–10.8)

## 2020-08-16 LAB — COMPLETE METABOLIC PANEL WITH GFR
AG Ratio: 1.6 (calc) (ref 1.0–2.5)
ALT: 18 U/L (ref 9–46)
AST: 16 U/L (ref 10–35)
Albumin: 4.2 g/dL (ref 3.6–5.1)
Alkaline phosphatase (APISO): 49 U/L (ref 35–144)
BUN/Creatinine Ratio: 21 (calc) (ref 6–22)
BUN: 24 mg/dL (ref 7–25)
CO2: 22 mmol/L (ref 20–32)
Calcium: 9.3 mg/dL (ref 8.6–10.3)
Chloride: 103 mmol/L (ref 98–110)
Creat: 1.15 mg/dL — ABNORMAL HIGH (ref 0.70–1.11)
GFR, Est African American: 68 mL/min/{1.73_m2} (ref >=60)
GFR, Est Non African American: 59 mL/min/{1.73_m2} — ABNORMAL LOW (ref >=60)
Globulin: 2.7 g/dL (calc) (ref 1.9–3.7)
Glucose, Bld: 231 mg/dL — ABNORMAL HIGH (ref 65–99)
Potassium: 4.8 mmol/L (ref 3.5–5.3)
Sodium: 136 mmol/L (ref 135–146)
Total Bilirubin: 0.4 mg/dL (ref 0.2–1.2)
Total Protein: 6.9 g/dL (ref 6.1–8.1)

## 2020-08-16 NOTE — Telephone Encounter (Signed)
-----  Message from Paula Ross V, MD sent at 08/11/2020 10:50 PM EDT ----- Lipids:   LDL is good    Thyroid function is normal Fluid overall OK Marker of inflammation is elevated  It was 20 5 days ago  Now 58   That shouldn't change that much   Before stopping I would check a CBC and ESR in 10 days   

## 2020-08-16 NOTE — Telephone Encounter (Signed)
The patient has been notified of the result and verbalized understanding.  All questions (if any) were answered. Antonieta Iba, RN 08/16/2020 10:53 AM

## 2020-08-20 ENCOUNTER — Other Ambulatory Visit: Payer: Self-pay

## 2020-08-20 ENCOUNTER — Encounter (HOSPITAL_COMMUNITY): Payer: Self-pay

## 2020-08-20 ENCOUNTER — Ambulatory Visit (HOSPITAL_COMMUNITY)
Admission: RE | Admit: 2020-08-20 | Discharge: 2020-08-20 | Disposition: A | Discharge status: Home / Self Care | Payer: Medicare Other | Source: Ambulatory Visit | Attending: Nurse Practitioner | Admitting: Nurse Practitioner

## 2020-08-20 DIAGNOSIS — K56609 Unspecified intestinal obstruction, unspecified as to partial versus complete obstruction: Secondary | ICD-10-CM | POA: Insufficient documentation

## 2020-08-20 DIAGNOSIS — K6389 Other specified diseases of intestine: Secondary | ICD-10-CM | POA: Diagnosis not present

## 2020-08-20 DIAGNOSIS — K529 Noninfective gastroenteritis and colitis, unspecified: Secondary | ICD-10-CM | POA: Diagnosis not present

## 2020-08-20 DIAGNOSIS — R1084 Generalized abdominal pain: Secondary | ICD-10-CM | POA: Diagnosis not present

## 2020-08-20 DIAGNOSIS — R933 Abnormal findings on diagnostic imaging of other parts of digestive tract: Secondary | ICD-10-CM | POA: Insufficient documentation

## 2020-08-20 DIAGNOSIS — N281 Cyst of kidney, acquired: Secondary | ICD-10-CM | POA: Diagnosis not present

## 2020-08-20 DIAGNOSIS — I700 Atherosclerosis of aorta: Secondary | ICD-10-CM | POA: Diagnosis not present

## 2020-08-20 MED ORDER — BARIUM SULFATE 0.1 % PO SUSP
ORAL | Status: AC
  Filled 2020-08-20: qty 1

## 2020-08-20 MED ORDER — IOHEXOL 300 MG/ML  SOLN
100.0000 mL | Freq: Once | INTRAMUSCULAR | Status: AC | PRN
  Administered 2020-08-20: 100 mL via INTRAVENOUS

## 2020-08-21 ENCOUNTER — Other Ambulatory Visit: Payer: Medicare Other | Admitting: *Deleted

## 2020-08-21 ENCOUNTER — Other Ambulatory Visit: Payer: Self-pay

## 2020-08-21 DIAGNOSIS — Z79899 Other long term (current) drug therapy: Secondary | ICD-10-CM | POA: Diagnosis not present

## 2020-08-22 LAB — CBC
Hematocrit: 38 % (ref 37.5–51.0)
Hemoglobin: 11.7 g/dL — ABNORMAL LOW (ref 13.0–17.7)
MCH: 24.8 pg — ABNORMAL LOW (ref 26.6–33.0)
MCHC: 30.8 g/dL — ABNORMAL LOW (ref 31.5–35.7)
MCV: 81 fL (ref 79–97)
Platelets: 365 10*3/uL (ref 150–450)
RBC: 4.71 x10E6/uL (ref 4.14–5.80)
RDW: 15.2 % (ref 11.6–15.4)
WBC: 7.9 10*3/uL (ref 3.4–10.8)

## 2020-08-22 LAB — SEDIMENTATION RATE: Sed Rate: 21 mm/hr (ref 0–30)

## 2020-08-28 ENCOUNTER — Ambulatory Visit: Payer: Medicare Other | Admitting: Gastroenterology

## 2020-08-28 ENCOUNTER — Encounter: Payer: Self-pay | Admitting: Gastroenterology

## 2020-08-28 VITALS — BP 128/78 | HR 85 | Ht 68.0 in | Wt 229.2 lb

## 2020-08-28 DIAGNOSIS — K5904 Chronic idiopathic constipation: Secondary | ICD-10-CM

## 2020-08-28 DIAGNOSIS — R933 Abnormal findings on diagnostic imaging of other parts of digestive tract: Secondary | ICD-10-CM

## 2020-08-28 DIAGNOSIS — K56600 Partial intestinal obstruction, unspecified as to cause: Secondary | ICD-10-CM

## 2020-08-28 NOTE — Progress Notes (Signed)
    History of Present Illness: This is an 85 year old male returning for follow-up after hospitalization for partial small bowel obstruction.  CT AP on 08/04/2020 showed a segment of ileum with wall thickening with a low-grade partial small bowel obstruction.  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  His symptoms improved and he was discharged with plans for an outpatient CT enterography which was performed on 08/20/2020 showing no small bowel thickening or mass and no signs of obstruction.  The prior segmental small bowel abnormality was no longer evident.  An infectious or inflammatory small bowel enteritis was suspected.  Current Medications, Allergies, Past Medical History, Past Surgical History, Family History and Social History were reviewed in [employer] record.   Physical Exam: General: Well developed, well nourished, no acute distress Head: Normocephalic and atraumatic Eyes:  sclerae anicteric, EOMI Ears: Normal auditory acuity Mouth: Not examined, mask on during Covid-19 pandemic Lungs: Clear throughout to auscultation Heart: Regular rate and rhythm; no murmurs, rubs or bruits Abdomen: Soft, non tender and non distended. No masses, hepatosplenomegaly or hernias noted. Normal Bowel sounds Rectal: Not done Musculoskeletal: Symmetrical with no gross deformities  Pulses:  Normal pulses noted Extremities: No clubbing, cyanosis, edema or deformities noted Neurological: Alert oriented x 4, grossly nonfocal Psychological:  Alert and cooperative. Normal mood and affect   Assessment and Recommendations:  1.  Resolved partial small bowel obstruction.  Abnormal CT scan of small bowel.  Findings likely secondary to small bowel enteritis, either infectious or inflammatory. IBD or an internal hernia were considered during his hospitalization but felt to be less likely. No  further GI evaluation at this time. Return if GI symptoms recur.   2. Constipation. Increase daily water and fiber intake. Colace daily. Dulcolax or Miralax prn.

## 2020-08-28 NOTE — Patient Instructions (Signed)
Increase your fiber daily and water intake daily.   Take over the counter Colace daily to help with constipation.   Thank you for choosing me and [facility].  Pricilla Riffle. Dagoberto Ligas., MD., Marval Regal

## 2020-09-10 ENCOUNTER — Ambulatory Visit (INDEPENDENT_AMBULATORY_CARE_PROVIDER_SITE_OTHER): Payer: Medicare Other | Admitting: Internal Medicine

## 2020-09-10 ENCOUNTER — Encounter: Payer: Self-pay | Admitting: Internal Medicine

## 2020-09-10 ENCOUNTER — Other Ambulatory Visit: Payer: Self-pay

## 2020-09-10 VITALS — BP 140/64 | HR 76 | Temp 98.8°F | Ht 68.0 in | Wt 230.0 lb

## 2020-09-10 DIAGNOSIS — E1159 Type 2 diabetes mellitus with other circulatory complications: Secondary | ICD-10-CM | POA: Diagnosis not present

## 2020-09-10 DIAGNOSIS — I480 Paroxysmal atrial fibrillation: Secondary | ICD-10-CM

## 2020-09-10 DIAGNOSIS — I10 Essential (primary) hypertension: Secondary | ICD-10-CM | POA: Diagnosis not present

## 2020-09-10 NOTE — Assessment & Plan Note (Signed)
stable overall by history and exam, recent data reviewed with pt, and pt to continue medical treatment as before,  to f/u any worsening symptoms or concerns  

## 2020-09-10 NOTE — Progress Notes (Signed)
Subjective:    Patient ID: [name], male    DOB: 1935-06-14, 85 y.o.   MRN: [id number]  HPI  Here with 1 wk onset tender subq nodule like mass and brusing to the right mid anterior forearm, and cannot recall any trauma on eliquis.  Pt denies chest pain, increased sob or doe, wheezing, orthopnea, PND, increased LE swelling, palpitations, dizziness or syncope. Pt denies new neurological symptoms such as new headache, or facial or extremity weakness or numbness   Pt denies polydipsia, polyuria  Pt denies fever, wt loss, night sweats, loss of appetite, or other constitutional symptoms Past Medical History:  Diagnosis Date  . Acute diastolic CHF (congestive heart failure) ([place]) 08/04/2020  . Arthritis   . Atrial fibrillation ([place])    post op; amiodarone and coumadin continued for 3 mos post op  . CARCINOMA, SKIN, SQUAMOUS CELL 01/07/2010  . Cataract    beginning of cataracts  . COLONIC POLYPS, HX OF 04/26/2007  . Coronary artery disease    s/p CABG 3/12: L-LAD, S-CFX (Dr. Roxy Manns); EF 45% at cath prior to CABG  . DIABETES MELLITUS, TYPE II 04/26/2007  . Hyperlipidemia   . HYPERTENSION 04/26/2007  . NEPHROLITHIASIS, HX OF 04/26/2007  . SBO (small bowel obstruction) ([place]) 10/2015   Partial   . Shortness of breath dyspnea    with exertion  . Unspecified hearing loss 12/18/2008   Past Surgical History:  Procedure Laterality Date  . BACK SURGERY     disectomy  . CARDIOVERSION N/A 06/08/2018   Procedure: CARDIOVERSION;  Surgeon: Buford Dresser, MD;  Location: [facility] ENDOSCOPY;  Service: Cardiovascular;  Laterality: N/A;  . CARDIOVERSION N/A 07/25/2018   Procedure: CARDIOVERSION;  Surgeon: Fay Records, MD;  Location: [place];  Service: Cardiovascular;  Laterality: N/A;  . COLONOSCOPY    . CORONARY ARTERY BYPASS GRAFT  01/12/2011  . LUMBAR LAMINECTOMY/DECOMPRESSION MICRODISCECTOMY N/A 05/08/2015   Procedure: LUMBAR LAMINECTOMY/DECOMPRESSION MICRODISCECTOMY 1 LEVEL Lumbar four five;   Surgeon: Melina Schools, MD;  Location: [place];  Service: Orthopedics;  Laterality: N/A;  . TOTAL HIP ARTHROPLASTY Left    left on 04/15/07  . [place]    reports that he quit smoking about 39 years ago. His smoking use included cigarettes. He has a 60.00 pack-year smoking history. He has never used smokeless tobacco. He reports that he does not drink alcohol and does not use drugs. family history includes Breast cancer in his sister; Cancer in his brother and sister; Heart disease in his brother and father; Heart failure in his mother. No Known Allergies Current Outpatient Medications on File Prior to Visit  Medication Sig Dispense Refill  . Accu-Chek Softclix Lancets lancets Use to check blood sugar daily DX:E11.59 100 each 3  . albuterol (VENTOLIN HFA) 108 (90 Base) MCG/ACT inhaler Inhale 2 puffs into the lungs every 6 (six) hours as needed for wheezing or shortness of breath. 8 g 2  . amiodarone (PACERONE) 200 MG tablet TAKE 1 TABLET BY MOUTH EVERY DAY (Patient taking differently: Take 200 mg by mouth daily. ) 90 tablet 2  . aspirin 81 MG tablet Take 81 mg by mouth daily.     . bisacodyl (DULCOLAX) 5 MG EC tablet Take 5 mg by mouth daily as needed for moderate constipation.    . Blood Glucose Monitoring Suppl (ACCU-CHEK AVIVA PLUS) w/Device KIT Use to check blood sugar daily DX:E11.59 1 kit 0  . cholecalciferol (VITAMIN D) 1000 units tablet Take 1,000 Units  by mouth 2 (two) times daily.    Marland Kitchen docusate sodium (COLACE) 100 MG capsule Take 300 mg by mouth at bedtime.    Marland Kitchen ELIQUIS 5 MG TABS tablet TAKE 1 TABLET BY MOUTH TWICE A DAY (Patient taking differently: Take 5 mg by mouth 2 (two) times daily. ) 60 tablet 5  . furosemide (LASIX) 40 MG tablet Take 1 tablet (40 mg total) by mouth as directed. Take 1 tablet every Mon, Wed, Fri 90 tablet 3  . gabapentin (NEURONTIN) 100 MG capsule TAKE 1 CAPSULE (100 MG TOTAL) BY MOUTH 3 (THREE) TIMES DAILY. OFFICE VISIT NEEDED (Patient taking  differently: Take 100 mg by mouth 3 (three) times daily. ) 270 capsule 3  . glucose blood (ACCU-CHEK AVIVA PLUS) test strip Use to check blood sugar daily DX:E11.59 100 each 3  . latanoprost (XALATAN) 0.005 % ophthalmic solution Place 1 drop into both eyes daily.    . metFORMIN (GLUCOPHAGE) 500 MG tablet Take 1 tablet (500 mg total) by mouth 3 (three) times daily. Do not take today 08/07/2020.  Start taking again from tomorrow 08/08/2020.    . metoprolol succinate (TOPROL-XL) 25 MG 24 hr tablet TAKE 1 TABLET BY MOUTH EVERY DAY (Patient taking differently: Take 25 mg by mouth daily. ) 90 tablet 2  . pantoprazole (PROTONIX) 40 MG tablet TAKE 1 TABLET BY MOUTH EVERY DAY (Patient taking differently: Take 40 mg by mouth daily. ) 90 tablet 3  . Propylene Glycol (SYSTANE BALANCE) 0.6 % SOLN Place 1 drop into both eyes 2 (two) times daily as needed (for dry eyes).    . repaglinide (PRANDIN) 2 MG tablet TAKE 1 TABLET BY MOUTH 3 TIMES DAILY BEFORE MEALS. (Patient taking differently: Take 2 mg by mouth 3 (three) times daily before meals. ) 270 tablet 3  . simvastatin (ZOCOR) 10 MG tablet TAKE 1 TABLET BY MOUTH EVERY DAY (Patient taking differently: Take 10 mg by mouth daily at 6 PM. ) 90 tablet 3  . Tiotropium Bromide-Olodaterol (STIOLTO RESPIMAT) 2.5-2.5 MCG/ACT AERS Inhale 2 puffs into the lungs daily. 4 g 0   No current facility-administered medications on file prior to visit.   Review of Systems All otherwise neg per pt    Objective:   Physical Exam BP 140/64 (BP Location: Left Arm, Patient Position: Sitting, Cuff Size: Large)   Pulse 76   Temp 98.8 F (37.1 C) (Oral)   Ht _0  (1.727 m)   Wt 230 lb (104.3 kg)   SpO2 92%   BMI 34.97 kg/m  VS noted,  Constitutional: Pt appears in NAD HENT: Head: NCAT.  Right Ear: External ear normal.  Left Ear: External ear normal.  Eyes: . Pupils are equal, round, and reactive to light. Conjunctivae and EOM are normal Nose: without d/c or deformity Neck:  Neck supple. Gross normal ROM Cardiovascular: Normal rate and regular rhythm.   Pulmonary/Chest: Effort normal and breath sounds without rales or wheezing.  Right mid anterior forerm with 1 cm sub tender hematoma with 3 cm surrounding brown purple swelling bruising Neurological: Pt is alert. At baseline orientation, motor grossly intact Skin: Skin is warm. No rashes, other new lesions, no LE edema Psychiatric: Pt behavior is normal without agitation  All otherwise neg per pt Lab Results  Component Value Date   WBC 7.9 08/21/2020   HGB 11.7 (L) 08/21/2020   HCT 38.0 08/21/2020   PLT 365 08/21/2020   GLUCOSE 231 (H) 08/15/2020   CHOL 126 08/09/2020   TRIG 149  08/09/2020   HDL 30 (L) 08/09/2020   LDLCALC 70 08/09/2020   ALT 18 08/15/2020   AST 16 08/15/2020   NA 136 08/15/2020   K 4.8 08/15/2020   CL 103 08/15/2020   CREATININE 1.15 (H) 08/15/2020   BUN 24 08/15/2020   CO2 22 08/15/2020   TSH 3.740 08/09/2020   PSA 1.18 07/02/2017   INR 2.3 03/03/2012   HGBA1C 6.5 (H) 08/06/2020   MICROALBUR 0.7 04/04/2018      Assessment & Plan:

## 2020-09-10 NOTE — Patient Instructions (Signed)
You have a moderate sided hematoma (and some bruising) to the right mid forearm  This should heal by itself in the next 2-4 wks  We should not need further xray or lab testing today  Please continue all other medications as before, and refills have been done if requested.  Please have the pharmacy call with any other refills you may need.  Please keep your appointments with your specialists as you may have planned

## 2020-09-10 NOTE — Assessment & Plan Note (Signed)
Stable, continue eliquis

## 2020-09-19 ENCOUNTER — Ambulatory Visit: Payer: Medicare Other | Admitting: Gastroenterology

## 2020-09-30 ENCOUNTER — Other Ambulatory Visit: Payer: Medicare Other

## 2020-09-30 ENCOUNTER — Telehealth: Payer: Self-pay | Admitting: *Deleted

## 2020-09-30 DIAGNOSIS — R0602 Shortness of breath: Secondary | ICD-10-CM

## 2020-10-01 ENCOUNTER — Telehealth: Payer: Self-pay | Admitting: Internal Medicine

## 2020-10-01 ENCOUNTER — Telehealth: Payer: Self-pay | Admitting: *Deleted

## 2020-10-01 DIAGNOSIS — R0602 Shortness of breath: Secondary | ICD-10-CM

## 2020-10-01 LAB — BASIC METABOLIC PANEL
BUN/Creatinine Ratio: 15 (ref 10–24)
BUN: 18 mg/dL (ref 8–27)
CO2: 22 mmol/L (ref 20–29)
Calcium: 9.1 mg/dL (ref 8.6–10.2)
Chloride: 105 mmol/L (ref 96–106)
Creatinine, Ser: 1.17 mg/dL (ref 0.76–1.27)
GFR calc Af Amer: 66 mL/min/{1.73_m2} (ref >59)
GFR calc non Af Amer: 57 mL/min/{1.73_m2} — ABNORMAL LOW (ref >59)
Glucose: 113 mg/dL — ABNORMAL HIGH (ref 65–99)
Potassium: 4.6 mmol/L (ref 3.5–5.2)
Sodium: 139 mmol/L (ref 134–144)

## 2020-10-01 LAB — PRO B NATRIURETIC PEPTIDE: NT-Pro BNP: 795 pg/mL — ABNORMAL HIGH (ref 0–486)

## 2020-10-01 MED ORDER — FUROSEMIDE 40 MG PO TABS: 40.0000 mg | ORAL_TABLET | ORAL | 3 refills | Status: DC

## 2020-10-01 NOTE — Telephone Encounter (Signed)
Pt came in with wife today.  She had appt  Says at times he gets SOB and chest tightness   Does note SOB with activity  QUestions if fluid  ON exam: Lungs Clear  Cardiac RRR  No S3 Ext with triv edema     WIll order BMET and BNP today     [name]

## 2020-10-01 NOTE — Telephone Encounter (Signed)
-----   Message from Fay Records, MD sent at 10/01/2020  3:32 PM EST ----- Fluid number (BNP is up a lttle more)    He is taking lasix 3 x per week    Could increae on 1 or 2 days to 1.5 tablets (60 mg ) and see how responds Check BMET and BNP in 2 wks

## 2020-10-01 NOTE — Telephone Encounter (Signed)
See accompanying phone note

## 2020-10-01 NOTE — Telephone Encounter (Signed)
Patient aware of results and recommendations. He will do this and return for labs on Dec 1.

## 2020-10-16 ENCOUNTER — Other Ambulatory Visit: Payer: Self-pay

## 2020-10-16 ENCOUNTER — Other Ambulatory Visit: Payer: Medicare Other

## 2020-10-16 DIAGNOSIS — R0602 Shortness of breath: Secondary | ICD-10-CM

## 2020-10-17 LAB — BASIC METABOLIC PANEL
BUN/Creatinine Ratio: 20 (ref 10–24)
BUN: 25 mg/dL (ref 8–27)
CO2: 21 mmol/L (ref 20–29)
Calcium: 9.6 mg/dL (ref 8.6–10.2)
Chloride: 107 mmol/L — ABNORMAL HIGH (ref 96–106)
Creatinine, Ser: 1.25 mg/dL (ref 0.76–1.27)
GFR calc Af Amer: 61 mL/min/{1.73_m2} (ref >59)
GFR calc non Af Amer: 53 mL/min/{1.73_m2} — ABNORMAL LOW (ref >59)
Glucose: 167 mg/dL — ABNORMAL HIGH (ref 65–99)
Potassium: 4.7 mmol/L (ref 3.5–5.2)
Sodium: 143 mmol/L (ref 134–144)

## 2020-10-17 LAB — PRO B NATRIURETIC PEPTIDE: NT-Pro BNP: 1312 pg/mL — ABNORMAL HIGH (ref 0–486)

## 2020-10-21 ENCOUNTER — Other Ambulatory Visit: Payer: Self-pay | Admitting: Emergency Medicine

## 2020-10-21 ENCOUNTER — Telehealth: Payer: Self-pay | Admitting: *Deleted

## 2020-10-21 DIAGNOSIS — R0602 Shortness of breath: Secondary | ICD-10-CM

## 2020-10-21 MED ORDER — FUROSEMIDE 40 MG PO TABS: ORAL_TABLET | ORAL | 3 refills | Status: DC

## 2020-10-21 NOTE — Telephone Encounter (Signed)
-----   Message from [name] V, MD sent at 10/20/2020 11:06 PM EST ----- Electrolytesa are OK Kidney function is stable  Fluid is still up  I would  recomm 60 mg lasix every other day   Dont stop Check BMET and BNP again in 2 wks    Keep track of wt

## 2020-10-21 NOTE — Telephone Encounter (Signed)
Patient informed to take lasix 60 mg every other day and try not to miss any doses. Will return for labs on 11/04/20.

## 2020-11-04 ENCOUNTER — Other Ambulatory Visit: Payer: Self-pay

## 2020-11-04 ENCOUNTER — Other Ambulatory Visit: Payer: Medicare Other | Admitting: *Deleted

## 2020-11-04 DIAGNOSIS — R0602 Shortness of breath: Secondary | ICD-10-CM | POA: Diagnosis not present

## 2020-11-05 LAB — PRO B NATRIURETIC PEPTIDE: NT-Pro BNP: 1052 pg/mL — ABNORMAL HIGH (ref 0–486)

## 2020-11-05 LAB — BASIC METABOLIC PANEL
BUN/Creatinine Ratio: 11 (ref 10–24)
BUN: 15 mg/dL (ref 8–27)
CO2: 22 mmol/L (ref 20–29)
Calcium: 9.2 mg/dL (ref 8.6–10.2)
Chloride: 101 mmol/L (ref 96–106)
Creatinine, Ser: 1.34 mg/dL — ABNORMAL HIGH (ref 0.76–1.27)
GFR calc Af Amer: 56 mL/min/{1.73_m2} — ABNORMAL LOW (ref >59)
GFR calc non Af Amer: 49 mL/min/{1.73_m2} — ABNORMAL LOW (ref >59)
Glucose: 132 mg/dL — ABNORMAL HIGH (ref 65–99)
Potassium: 3.9 mmol/L (ref 3.5–5.2)
Sodium: 141 mmol/L (ref 134–144)

## 2020-11-07 ENCOUNTER — Telehealth: Payer: Self-pay

## 2020-11-07 DIAGNOSIS — I2510 Atherosclerotic heart disease of native coronary artery without angina pectoris: Secondary | ICD-10-CM

## 2020-11-07 DIAGNOSIS — R0602 Shortness of breath: Secondary | ICD-10-CM

## 2020-11-07 NOTE — Telephone Encounter (Signed)
Pt aware of lab results and recommendations. He will be in for labs on 1/7. No additional needs at this time.

## 2020-11-07 NOTE — Telephone Encounter (Signed)
-----   Message from [name] V, MD sent at 11/07/2020  7:21 AM EST ----- Fluid is still up though a little better Kidney function is relatively stable   I would keep on same regimen     F/U BMET and BNP in another 2 wks   Watch/limit salt over next few wks

## 2020-11-20 ENCOUNTER — Ambulatory Visit: Payer: Medicare Other | Admitting: Internal Medicine

## 2020-11-22 ENCOUNTER — Other Ambulatory Visit: Payer: Self-pay

## 2020-11-22 ENCOUNTER — Other Ambulatory Visit: Payer: Medicare Other | Admitting: *Deleted

## 2020-11-22 DIAGNOSIS — I2510 Atherosclerotic heart disease of native coronary artery without angina pectoris: Secondary | ICD-10-CM | POA: Diagnosis not present

## 2020-11-22 DIAGNOSIS — R0602 Shortness of breath: Secondary | ICD-10-CM | POA: Diagnosis not present

## 2020-11-23 LAB — BASIC METABOLIC PANEL
BUN/Creatinine Ratio: 17 (ref 10–24)
BUN: 21 mg/dL (ref 8–27)
CO2: 23 mmol/L (ref 20–29)
Calcium: 9.1 mg/dL (ref 8.6–10.2)
Chloride: 103 mmol/L (ref 96–106)
Creatinine, Ser: 1.24 mg/dL (ref 0.76–1.27)
GFR calc Af Amer: 62 mL/min/{1.73_m2} (ref >59)
GFR calc non Af Amer: 53 mL/min/{1.73_m2} — ABNORMAL LOW (ref >59)
Glucose: 112 mg/dL — ABNORMAL HIGH (ref 65–99)
Potassium: 4.1 mmol/L (ref 3.5–5.2)
Sodium: 139 mmol/L (ref 134–144)

## 2020-11-23 LAB — PRO B NATRIURETIC PEPTIDE: NT-Pro BNP: 1168 pg/mL — ABNORMAL HIGH (ref 0–486)

## 2020-11-29 ENCOUNTER — Ambulatory Visit (INDEPENDENT_AMBULATORY_CARE_PROVIDER_SITE_OTHER): Payer: Medicare Other | Admitting: Internal Medicine

## 2020-11-29 ENCOUNTER — Other Ambulatory Visit: Payer: Self-pay

## 2020-11-29 ENCOUNTER — Encounter: Payer: Self-pay | Admitting: Internal Medicine

## 2020-11-29 VITALS — BP 130/82 | HR 74 | Temp 98.1°F | Wt 223.0 lb

## 2020-11-29 DIAGNOSIS — J849 Interstitial pulmonary disease, unspecified: Secondary | ICD-10-CM

## 2020-11-29 DIAGNOSIS — I10 Essential (primary) hypertension: Secondary | ICD-10-CM | POA: Diagnosis not present

## 2020-11-29 DIAGNOSIS — D485 Neoplasm of uncertain behavior of skin: Secondary | ICD-10-CM | POA: Diagnosis not present

## 2020-11-29 DIAGNOSIS — E785 Hyperlipidemia, unspecified: Secondary | ICD-10-CM

## 2020-11-29 DIAGNOSIS — E1169 Type 2 diabetes mellitus with other specified complication: Secondary | ICD-10-CM

## 2020-11-29 DIAGNOSIS — E1159 Type 2 diabetes mellitus with other circulatory complications: Secondary | ICD-10-CM

## 2020-11-29 DIAGNOSIS — I5031 Acute diastolic (congestive) heart failure: Secondary | ICD-10-CM | POA: Diagnosis not present

## 2020-11-29 DIAGNOSIS — Z23 Encounter for immunization: Secondary | ICD-10-CM

## 2020-11-29 DIAGNOSIS — I480 Paroxysmal atrial fibrillation: Secondary | ICD-10-CM

## 2020-11-29 LAB — BASIC METABOLIC PANEL
BUN: 22 mg/dL (ref 6–23)
CO2: 26 mEq/L (ref 19–32)
Calcium: 9.6 mg/dL (ref 8.4–10.5)
Chloride: 103 mEq/L (ref 96–112)
Creatinine, Ser: 1.44 mg/dL (ref 0.40–1.50)
GFR: 44.83 mL/min — ABNORMAL LOW (ref >60.00)
Glucose, Bld: 95 mg/dL (ref 70–99)
Potassium: 4 mEq/L (ref 3.5–5.1)
Sodium: 140 mEq/L (ref 135–145)

## 2020-11-29 LAB — HEMOGLOBIN A1C: Hgb A1c MFr Bld: 6.9 % — ABNORMAL HIGH (ref 4.6–6.5)

## 2020-11-29 NOTE — Assessment & Plan Note (Signed)
BP Readings from Last 3 Encounters:  11/29/20 130/82  09/10/20 140/64  08/28/20 128/78   Toprol, Furosemide

## 2020-11-29 NOTE — Progress Notes (Signed)
Subjective:  Patient ID: [name], male    DOB: 11-04-1935  Age: 86 y.o. MRN: [id number]  CC: Follow-up (3 months f/u- Flu shot)   HPI [name] presents for CHF, DM, A fib/PAF f/u Lost wt on Furosemide  Outpatient Medications Prior to Visit  Medication Sig Dispense Refill  . Accu-Chek Softclix Lancets lancets Use to check blood sugar daily DX:E11.59 100 each 3  . albuterol (VENTOLIN HFA) 108 (90 Base) MCG/ACT inhaler TAKE 2 PUFFS BY MOUTH EVERY 6 HOURS AS NEEDED FOR WHEEZE OR SHORTNESS OF BREATH 6.7 each 2  . amiodarone (PACERONE) 200 MG tablet TAKE 1 TABLET BY MOUTH EVERY DAY (Patient taking differently: Take 200 mg by mouth daily.) 90 tablet 2  . aspirin 81 MG tablet Take 81 mg by mouth daily.     . bisacodyl (DULCOLAX) 5 MG EC tablet Take 5 mg by mouth daily as needed for moderate constipation.    . Blood Glucose Monitoring Suppl (ACCU-CHEK AVIVA PLUS) w/Device KIT Use to check blood sugar daily DX:E11.59 1 kit 0  . cholecalciferol (VITAMIN D) 1000 units tablet Take 1,000 Units by mouth 2 (two) times daily.    Marland Kitchen docusate sodium (COLACE) 100 MG capsule Take 300 mg by mouth at bedtime.    Marland Kitchen ELIQUIS 5 MG TABS tablet TAKE 1 TABLET BY MOUTH TWICE A DAY (Patient taking differently: Take 5 mg by mouth 2 (two) times daily.) 60 tablet 5  . furosemide (LASIX) 40 MG tablet Take 1.5 tablets (60 mg) every other day 90 tablet 3  . gabapentin (NEURONTIN) 100 MG capsule TAKE 1 CAPSULE (100 MG TOTAL) BY MOUTH 3 (THREE) TIMES DAILY. OFFICE VISIT NEEDED (Patient taking differently: Take 100 mg by mouth 3 (three) times daily.) 270 capsule 3  . glucose blood (ACCU-CHEK AVIVA PLUS) test strip Use to check blood sugar daily DX:E11.59 100 each 3  . latanoprost (XALATAN) 0.005 % ophthalmic solution Place 1 drop into both eyes daily.    . metFORMIN (GLUCOPHAGE) 500 MG tablet Take 1 tablet (500 mg total) by mouth 3 (three) times daily. Do not take today 08/07/2020.  Start taking again from  tomorrow 08/08/2020.    . metoprolol succinate (TOPROL-XL) 25 MG 24 hr tablet TAKE 1 TABLET BY MOUTH EVERY DAY (Patient taking differently: Take 25 mg by mouth daily.) 90 tablet 2  . pantoprazole (PROTONIX) 40 MG tablet TAKE 1 TABLET BY MOUTH EVERY DAY (Patient taking differently: Take 40 mg by mouth daily.) 90 tablet 3  . Propylene Glycol 0.6 % SOLN Place 1 drop into both eyes 2 (two) times daily as needed (for dry eyes).    . repaglinide (PRANDIN) 2 MG tablet TAKE 1 TABLET BY MOUTH 3 TIMES DAILY BEFORE MEALS. (Patient taking differently: Take 2 mg by mouth 3 (three) times daily before meals.) 270 tablet 3  . simvastatin (ZOCOR) 10 MG tablet TAKE 1 TABLET BY MOUTH EVERY DAY (Patient taking differently: Take 10 mg by mouth daily at 6 PM.) 90 tablet 3  . Tiotropium Bromide-Olodaterol (STIOLTO RESPIMAT) 2.5-2.5 MCG/ACT AERS Inhale 2 puffs into the lungs daily. 4 g 0   No facility-administered medications prior to visit.    ROS: Review of Systems  Constitutional: Positive for fatigue. Negative for appetite change and unexpected weight change.  HENT: Negative for congestion, nosebleeds, sneezing, sore throat and trouble swallowing.   Eyes: Negative for itching and visual disturbance.  Respiratory: Negative for cough.   Cardiovascular: Negative for chest pain, palpitations and leg  swelling.  Gastrointestinal: Negative for abdominal distention, blood in stool, diarrhea and nausea.  Genitourinary: Negative for frequency and hematuria.  Musculoskeletal: Positive for arthralgias and back pain. Negative for gait problem, joint swelling and neck pain.  Skin: Negative for rash.  Neurological: Negative for dizziness, tremors, speech difficulty and weakness.  Psychiatric/Behavioral: Negative for agitation, dysphoric mood and sleep disturbance. The patient is not nervous/anxious.     Objective:  BP 130/82 (BP Location: Left Arm)   Pulse 74   Temp 98.1 F (36.7 C) (Oral)   Wt 223 lb (101.2 kg)   SpO2  95%   BMI 33.91 kg/m   BP Readings from Last 3 Encounters:  11/29/20 130/82  09/10/20 140/64  08/28/20 128/78    Wt Readings from Last 3 Encounters:  11/29/20 223 lb (101.2 kg)  09/10/20 230 lb (104.3 kg)  08/28/20 229 lb 3.2 oz (104 kg)    Physical Exam Constitutional:      General: He is not in acute distress.    Appearance: He is well-developed. He is obese.     Comments: NAD  HENT:     Mouth/Throat:     Mouth: Oropharynx is clear and moist.  Eyes:     Conjunctiva/sclera: Conjunctivae normal.     Pupils: Pupils are equal, round, and reactive to light.  Neck:     Thyroid: No thyromegaly.     Vascular: No JVD.  Cardiovascular:     Rate and Rhythm: Normal rate and regular rhythm.     Pulses: Intact distal pulses.     Heart sounds: Normal heart sounds. No murmur heard. No friction rub. No gallop.   Pulmonary:     Effort: Pulmonary effort is normal. No respiratory distress.     Breath sounds: Normal breath sounds. No wheezing or rales.  Chest:     Chest wall: No tenderness.  Abdominal:     General: Bowel sounds are normal. There is no distension.     Palpations: Abdomen is soft. There is no mass.     Tenderness: There is no abdominal tenderness. There is no guarding or rebound.  Musculoskeletal:        General: Tenderness present. No edema. Normal range of motion.     Cervical back: Normal range of motion.  Lymphadenopathy:     Cervical: No cervical adenopathy.  Skin:    General: Skin is warm and dry.     Findings: No rash.  Neurological:     Mental Status: He is alert and oriented to person, place, and time.     Cranial Nerves: No cranial nerve deficit.     Motor: No abnormal muscle tone.     Coordination: He displays a negative Romberg sign. Coordination normal.     Gait: Gait normal.     Deep Tendon Reflexes: Reflexes are normal and symmetric.  Psychiatric:        Mood and Affect: Mood and affect normal.        Behavior: Behavior normal.        Thought  Content: Thought content normal.        Judgment: Judgment normal.   back hurts Skin lesion L hand - a 1 cm nodule/ulcer         Lab Results  Component Value Date   WBC 7.9 08/21/2020   HGB 11.7 (L) 08/21/2020   HCT 38.0 08/21/2020   PLT 365 08/21/2020   GLUCOSE 112 (H) 11/22/2020   CHOL 126 08/09/2020   TRIG 149  08/09/2020   HDL 30 (L) 08/09/2020   LDLCALC 70 08/09/2020   ALT 18 08/15/2020   AST 16 08/15/2020   NA 139 11/22/2020   K 4.1 11/22/2020   CL 103 11/22/2020   CREATININE 1.24 11/22/2020   BUN 21 11/22/2020   CO2 23 11/22/2020   TSH 3.740 08/09/2020   PSA 1.18 07/02/2017   INR 2.3 03/03/2012   HGBA1C 6.5 (H) 08/06/2020   MICROALBUR 0.7 04/04/2018    CT ENTERO ABD/PELVIS W CONTAST  Result Date: 08/20/2020 CLINICAL DATA:  Follow-up small bowel wall thickening EXAM: CT ABDOMEN AND PELVIS WITH CONTRAST (ENTEROGRAPHY) TECHNIQUE: Multidetector CT of the abdomen and pelvis during bolus administration of intravenous contrast. Negative oral contrast was given. CONTRAST:  126m OMNIPAQUE IOHEXOL 300 MG/ML  SOLN COMPARISON:  CT abdomen/pelvis dated 08/05/2020. CT abdomen/pelvis dated 08/04/2020. FINDINGS: Lower chest: Mild subpleural reticulation/fibrosis at the lung bases. Hepatobiliary: Liver is within normal limits. Gallbladder is unremarkable. No intrahepatic or extrahepatic ductal dilatation. Pancreas: Within normal limits. Spleen: Within normal limits. Adrenals/Urinary Tract: Adrenal glands are within normal limits. 8 mm lateral left upper pole renal cyst (series 8/image 16). Right kidney is within normal limits. No hydronephrosis. Bladder is within normal limits, although partially obscured by streak artifact. Stomach/Bowel: Stomach is within normal limits. No evidence of bowel obstruction. No small bowel wall thickening or mass is evident on CT. Specifically, the prior segment of abnormal small bowel beneath the right mid anterior abdominal wall is no longer evident.  Normal appendix (series 2/image 81). No colonic wall thickening or inflammatory changes. Vascular/Lymphatic: No evidence of abdominal aortic aneurysm. Atherosclerotic calcifications of the abdominal aorta and branch vessels. No suspicious abdominopelvic lymphadenopathy. Reproductive: Prostate is grossly unremarkable, although partially obscured by streak artifact. Other: No abdominopelvic ascites. Musculoskeletal: Left hip arthroplasty, without evidence of complication. Mild degenerative changes of the visualized thoracolumbar spine. Median sternotomy. IMPRESSION: No small bowel wall thickening or mass is evident on CT. Specifically, the prior segment of abnormal small bowel beneath the right mid anterior abdominal wall is no longer evident. Presumably, this reflected infectious/inflammatory small bowel enteritis, which has now resolved. Additional stable ancillary findings as above. Electronically Signed   By: SJulian HyM.D.   On: 08/20/2020 16:50    Assessment & Plan:     Follow-up: No follow-ups on file.  AWalker Kehr MD

## 2020-11-29 NOTE — Assessment & Plan Note (Signed)
On Simvastatin 

## 2020-11-29 NOTE — Assessment & Plan Note (Signed)
Lost wt on Furosemide

## 2020-11-29 NOTE — Assessment & Plan Note (Signed)
Skin lesion L hand - a 1 cm nodule/ulcer - likely skin cancer Derm ref

## 2020-11-29 NOTE — Assessment & Plan Note (Addendum)
Cont w/Toprol, daily baby aspirin, amiodarone, Eliquis On Paceron

## 2020-12-05 NOTE — Progress Notes (Signed)
Cardiology Office Note   Date:  12/09/2020   ID:  [name], DOB [birth year], MRN [id number]  PCP:  Cassandria Anger, MD  Cardiologist:   [name], MD   F/u of CAD and atrial fibrillaiton        History of Present Illness: [name] is a 86 y.o. male with a history ofCAD, (s/p CABG 01/2011 LIMA to LAD; SVG to Cx),diabetes, hypertension, nephrolithiasis, hyperlipidemia, and atrial fibrillaiton  He underwent cardioversion in past that [place] and then had  amiodarone load.   Converted   Went back into atrial fib   .. .  Echo in Sept 2019 LVEF est at 40 to 45%   By my review it appeared greater    Myovue in November 2019 was normal    The pt had near syncope in June 2020  Monitor showed no arrhythmias  Symptoms improved/resolved    In Dec 2020 he and his wife were hospitalized with COVID    I saw him in Feb  He complained of DOE and giving  out easier    Labs done    I recomm adding lasix 82m alternate with 20 mg  [facility]  Since d/c he says his breathing is still a little down.  Tires  He denies CP     In Nov he came to his wifes appt  Complained of some SOB    BNP was elevatdd  Lasix dosing adjusted  Current Meds  Medication Sig  . Accu-Chek Softclix Lancets lancets Use to check blood sugar daily DX:E11.59  . albuterol (VENTOLIN HFA) 108 (90 Base) MCG/ACT inhaler TAKE 2 PUFFS BY MOUTH EVERY 6 HOURS AS NEEDED FOR WHEEZE OR SHORTNESS OF BREATH  . amiodarone (PACERONE) 200 MG tablet TAKE 1 TABLET BY MOUTH EVERY DAY (Patient taking differently: Take 200 mg by mouth daily.)  . aspirin 81 MG tablet Take 81 mg by mouth daily.   . bisacodyl (DULCOLAX) 5 MG EC tablet Take 5 mg by mouth daily as needed for moderate constipation.  . Blood Glucose Monitoring Suppl (ACCU-CHEK AVIVA PLUS) w/Device KIT Use to check blood sugar daily DX:E11.59  . cholecalciferol (VITAMIN D) 1000 units tablet Take 1,000 Units by mouth  2 (two) times daily.  .Marland Kitchendocusate sodium (COLACE) 100 MG capsule Take 300 mg by mouth at bedtime.  .Marland KitchenELIQUIS 5 MG TABS tablet TAKE 1 TABLET BY MOUTH TWICE A DAY (Patient taking differently: Take 5 mg by mouth 2 (two) times daily.)  . furosemide (LASIX) 40 MG tablet Take 1.5 tablets (60 mg) every other day  . gabapentin (NEURONTIN) 100 MG capsule TAKE 1 CAPSULE (100 MG TOTAL) BY MOUTH 3 (THREE) TIMES DAILY. OFFICE VISIT NEEDED (Patient taking differently: Take 100 mg by mouth 3 (three) times daily.)  . glucose blood (ACCU-CHEK AVIVA PLUS) test strip Use to check blood sugar daily DX:E11.59  . latanoprost (XALATAN) 0.005 % ophthalmic solution Place 1 drop into both eyes daily.  . metFORMIN (GLUCOPHAGE) 500 MG tablet Take 1 tablet (500 mg total) by mouth 3 (three) times daily. Do not take today 08/07/2020.  Start taking again from tomorrow 08/08/2020.  . metoprolol succinate (TOPROL-XL) 25 MG 24 hr tablet TAKE 1 TABLET BY MOUTH EVERY DAY (Patient taking differently: Take 25 mg by mouth daily.)  . pantoprazole (PROTONIX) 40 MG tablet TAKE 1 TABLET BY MOUTH EVERY DAY (Patient taking differently: Take 40 mg by mouth daily.)  .  Propylene Glycol 0.6 % SOLN Place 1 drop into both eyes 2 (two) times daily as needed (for dry eyes).  . repaglinide (PRANDIN) 2 MG tablet TAKE 1 TABLET BY MOUTH 3 TIMES DAILY BEFORE MEALS. (Patient taking differently: Take 2 mg by mouth 3 (three) times daily before meals.)  . simvastatin (ZOCOR) 10 MG tablet TAKE 1 TABLET BY MOUTH EVERY DAY (Patient taking differently: Take 10 mg by mouth daily at 6 PM.)  . Tiotropium Bromide-Olodaterol (STIOLTO RESPIMAT) 2.5-2.5 MCG/ACT AERS Inhale 2 puffs into the lungs daily.     Allergies:   Patient has no known allergies.   Past Medical History:  Diagnosis Date  . Acute diastolic CHF (congestive heart failure) ([place]) 08/04/2020  . Arthritis   . Atrial fibrillation ([place])    post op; amiodarone and coumadin continued for 3 mos post op  .  CARCINOMA, SKIN, SQUAMOUS CELL 01/07/2010  . Cataract    beginning of cataracts  . COLONIC POLYPS, HX OF 04/26/2007  . Coronary artery disease    s/p CABG 3/12: L-LAD, S-CFX (Dr. Roxy Manns); EF 45% at cath prior to CABG  . DIABETES MELLITUS, TYPE II 04/26/2007  . Hyperlipidemia   . HYPERTENSION 04/26/2007  . NEPHROLITHIASIS, HX OF 04/26/2007  . SBO (small bowel obstruction) ([place]) 10/2015   Partial   . Shortness of breath dyspnea    with exertion  . Unspecified hearing loss 12/18/2008    Past Surgical History:  Procedure Laterality Date  . BACK SURGERY     disectomy  . CARDIOVERSION N/A 06/08/2018   Procedure: CARDIOVERSION;  Surgeon: Buford Dresser, MD;  [facility] ENDOSCOPY;  Service: Cardiovascular;  Laterality: N/A;  . CARDIOVERSION N/A 07/25/2018   Procedure: CARDIOVERSION;  Surgeon: Fay Records, MD;  Location: [place];  Service: Cardiovascular;  Laterality: N/A;  . COLONOSCOPY    . CORONARY ARTERY BYPASS GRAFT  01/12/2011  . LUMBAR LAMINECTOMY/DECOMPRESSION MICRODISCECTOMY N/A 05/08/2015   Procedure: LUMBAR LAMINECTOMY/DECOMPRESSION MICRODISCECTOMY 1 LEVEL Lumbar four five;  Surgeon: Melina Schools, MD;  Location: [place];  Service: Orthopedics;  Laterality: N/A;  . TOTAL HIP ARTHROPLASTY Left    left on 04/15/07  . TYMPANIC MEMBRANE REPAIR  1981     Social History:  The patient  reports that he quit smoking about 39 years ago. His smoking use included cigarettes. He has a 60.00 pack-year smoking history. He has never used smokeless tobacco. He reports that he does not drink alcohol and does not use drugs.   Family History:  The patient's family history includes Breast cancer in his sister; Cancer in his brother and sister; Heart disease in his brother and father; Heart failure in his mother.    ROS:  Please see the history of present illness. All other systems are reviewed and  Negative to the above problem except as noted.    PHYSICAL EXAM: VS:  BP 136/64   Pulse 74    Ht '5\' 8"'  (1.727 m)   Wt 228 lb (103.4 kg)   BMI 34.67 kg/m   GEN:  Morbidly obese 86 yo in no acute distress  HEENT: normal  Neck: JVP is normal   Cardiac:  Regular rate and rhytm; no murmurs No LE edema   Respiratory:  clear to auscultation bilaterally GI: soft, nontender, obese    Skin: warm and dry,  L hand with nonhealing wound above thumb Neuro:  Strength and sensation are intact Psych: euthymic mood, full affect   Echo 9/21  Left ventricular ejection fraction, by  estimation, is 55 to 60%. The left ventricle has normal function. The left ventricle has no regional wall motion abnormalities. Left ventricular diastolic parameters are consistent with Grade II diastolic dysfunction (pseudonormalization). Elevated left ventricular end-diastolic pressure. 2. Right ventricular systolic function is normal. The right ventricular size is normal. Tricuspid regurgitation signal is inadequate for assessing PA pressure. 3. Left atrial size was mildly dilated. 4. The mitral valve is normal in structure. Trivial mitral valve regurgitation. No evidence of mitral stenosis. Moderate mitral annular calcification. 5. The aortic valve was not well visualized. Aortic valve regurgitation is not visualized. Mild aortic valve sclerosis is present, with no evidence of aortic valve stenosis. 6. The inferior vena cava is normal in size with greater than 50% respiratory variability, suggesting right atrial pressure of 3 mmHg.  EKG:  EKG Is ordered today   SR 74  Occasional PVC  First degree AV block  PR 240 sem  Lipid Panel    Component Value Date/Time   CHOL 126 08/09/2020 1356   TRIG 149 08/09/2020 1356   TRIG 78 11/05/2006 1123   HDL 30 (L) 08/09/2020 1356   CHOLHDL 4.2 08/09/2020 1356   CHOLHDL 2.5 01/17/2016 1209   VLDL 20 01/17/2016 1209   LDLCALC 70 08/09/2020 1356      Wt Readings from Last 3 Encounters:  12/09/20 228 lb (103.4 kg)  11/29/20 223 lb (101.2 kg)  09/10/20 230 lb (104.3  kg)      ASSESSMENT AND PLAN:  1  Dyspnea  Pt says his breathing is improved   Keep on current regimen of 60 mg alt with 40 mg daily  Electrolytes are OK    2 Atrial fibrillation   REmainis in SR on amiodarone. Currently on 200 mg and Eliquis Need to review when to taper to 100    3  CAD   Pt s/p CABG in 2012 Myovue scan in November 2019 was normal. No symtposm of angina   4  HTN   BP is controlled  Keep on same meds    5  HL Continue simvastatin  LDL in Feb Sept 2021 was 70  HDL 30  Stay activie    6 Hx of d izziness  Pt denies dizziness    7  Morbid obesity  I have reviewed diet in past    8  Derm  Will try to expedite appt to derm   Follow-up in Aug 2022  Current medicines are reviewed at length with the patient today.  The patient does not have concerns regarding medicines.  Signed, [name], MD  12/09/2020 1:53 PM    [facility] [address], [place], [place]  [address] Phone: [phone]; Fax: [phone]

## 2020-12-09 ENCOUNTER — Encounter: Payer: Self-pay | Admitting: Internal Medicine

## 2020-12-09 ENCOUNTER — Other Ambulatory Visit: Payer: Self-pay

## 2020-12-09 ENCOUNTER — Ambulatory Visit: Payer: Medicare Other | Admitting: Internal Medicine

## 2020-12-09 VITALS — BP 136/64 | HR 74 | Ht 68.0 in | Wt 228.0 lb

## 2020-12-09 DIAGNOSIS — I2510 Atherosclerotic heart disease of native coronary artery without angina pectoris: Secondary | ICD-10-CM

## 2020-12-09 MED ORDER — FUROSEMIDE 40 MG PO TABS: ORAL_TABLET | ORAL | 3 refills | Status: DC

## 2020-12-09 NOTE — Patient Instructions (Signed)
Medication Instructions:  No changes *If you need a refill on your cardiac medications before your next appointment, please call your pharmacy*   Lab Work: none If you have labs (blood work) drawn today and your tests are completely normal, you will receive your results only by: Marland Kitchen MyChart Message (if you have MyChart) OR . A paper copy in the mail If you have any lab test that is abnormal or we need to change your treatment, we will call you to review the results.   Testing/Procedures: none   Follow-Up: At [facility], you and your health needs are our priority.  As part of our continuing mission to provide you with exceptional heart care, we have created designated Provider Care Teams.  These Care Teams include your primary Cardiologist (physician) and Advanced Practice Providers (APPs -  Physician Assistants and Nurse Practitioners) who all work together to provide you with the care you need, when you need it.    Your next appointment:   8 month(s)  (August 2022)  The format for your next appointment:   In Person  Provider:   You may see [name], MD or one of the following Advanced Practice Providers on your designated Care Team:    Richardson Dopp, PA-C  Robbie Lis, [place]   Other Instructions

## 2020-12-19 ENCOUNTER — Telehealth: Payer: Self-pay | Admitting: Internal Medicine

## 2020-12-19 NOTE — Telephone Encounter (Signed)
Patient calling to report the lesion on his hand is getting a worse since last office visit. The first Dermatology appointment is June  Patient would like to know if he should wait until June or see Dr Alain Marion

## 2020-12-20 NOTE — Telephone Encounter (Signed)
Called pt there was no answer LMOM w/MD response. And to call back to make the appt.Marland KitchenJohny Chess

## 2020-12-20 NOTE — Telephone Encounter (Signed)
Please schedule skin biopsy with me.  Thanks

## 2020-12-26 ENCOUNTER — Ambulatory Visit (INDEPENDENT_AMBULATORY_CARE_PROVIDER_SITE_OTHER): Payer: Medicare Other | Admitting: Internal Medicine

## 2020-12-26 ENCOUNTER — Other Ambulatory Visit: Payer: Self-pay | Admitting: Internal Medicine

## 2020-12-26 ENCOUNTER — Other Ambulatory Visit: Payer: Self-pay

## 2020-12-26 ENCOUNTER — Encounter: Payer: Self-pay | Admitting: Internal Medicine

## 2020-12-26 VITALS — BP 122/68 | HR 74 | Temp 97.9°F

## 2020-12-26 DIAGNOSIS — L723 Sebaceous cyst: Secondary | ICD-10-CM

## 2020-12-26 DIAGNOSIS — D485 Neoplasm of uncertain behavior of skin: Secondary | ICD-10-CM

## 2020-12-26 DIAGNOSIS — Z7901 Long term (current) use of anticoagulants: Secondary | ICD-10-CM | POA: Diagnosis not present

## 2020-12-26 DIAGNOSIS — C44629 Squamous cell carcinoma of skin of left upper limb, including shoulder: Secondary | ICD-10-CM

## 2020-12-26 MED ORDER — FUROSEMIDE 40 MG PO TABS: ORAL_TABLET | ORAL | 3 refills | Status: DC

## 2020-12-26 MED ORDER — LIDOCAINE-EPINEPHRINE 2 %-1:100000 IJ SOLN
2.0000 mL | Freq: Once | INTRAMUSCULAR | Status: AC
  Administered 2020-12-26: 2 mL via INTRADERMAL

## 2020-12-26 MED ORDER — DOXYCYCLINE HYCLATE 100 MG PO TABS: 100.0000 mg | ORAL_TABLET | Freq: Two times a day (BID) | ORAL | 0 refills | Status: DC

## 2020-12-26 NOTE — Patient Instructions (Signed)
Postprocedure instructions :    A Band-Aid should be  changed once or twice daily. You can take a shower tomorrow.  Keep the wound clean. You can wash it with liquid soap and water. Pat dry with gauze or a Kleenex tissue before applying antibiotic ointment and a Band-Aid.   You need to report immediately  if fever, chills or any signs of infection develop.    The biopsy results should be available in 1 -2 weeks.    Epidermoid Cyst  An epidermoid cyst, also called an epidermal cyst, is a small lump under your skin. The cyst contains a substance called keratin. Do not try to pop or open the cyst yourself. What are the causes?  A blocked hair follicle.  A hair that curls and re-enters the skin instead of growing straight out of the skin.  A blocked pore.  Irritated skin.  An injury to the skin.  Certain conditions that are passed along from parent to child.  Human papillomavirus (HPV). This happens rarely when cysts occur on the bottom of the feet.  Long-term sun damage to the skin. What increases the risk?  Having acne.  Being male.  Having an injury to the skin.  Being past puberty.  Having certain conditions caused by genes (genetic disorder) What are the signs or symptoms? These cysts are usually harmless, but they can get infected. Symptoms of infection may include:  Redness.  Inflammation.  Tenderness.  Warmth.  Fever.  A bad-smelling substance that drains from the cyst.  Pus that drains from the cyst. How is this treated? In many cases, epidermoid cysts go away on their own without treatment. If a cyst becomes infected, treatment may include:  Opening and draining the cyst, done by a doctor. After draining, you may need minor surgery to remove the rest of the cyst.  Antibiotic medicine.  Shots of medicines (steroids) that help to reduce inflammation.  Surgery to remove the cyst. Surgery may be done if the cyst: ? Becomes large. ? Bothers  you. ? Has a chance of turning into cancer.  Do not try to open a cyst yourself. Follow these instructions at home: Medicines  Take over-the-counter and prescription medicines as told by your doctor.  If you were prescribed an antibiotic medicine, take it as told by your doctor. Do not stop taking it even if you start to feel better. General instructions  Keep the area around your cyst clean and dry.  Wear loose, dry clothing.  Avoid touching your cyst.  Check your cyst every day for signs of infection. Check for: ? Redness, swelling, or pain. ? Fluid or blood. ? Warmth. ? Pus or a bad smell.  Keep all follow-up visits. How is this prevented?  Wear clean, dry, clothing.  Avoid wearing tight clothing.  Keep your skin clean and dry. Take showers or baths every day. Contact a doctor if:  Your cyst has symptoms of infection.  Your condition does not improve or gets worse.  You have a cyst that looks different from other cysts you have had.  You have a fever. Get help right away if:  Redness spreads from the cyst into the area close by. Summary  An epidermoid cyst is a small lump under your skin.  If a cyst becomes infected, treatment may include surgery to open and drain the cyst, or to remove it.  Take over-the-counter and prescription medicines only as told by your doctor.  Contact a doctor if  your condition is not improving or is getting worse.  Keep all follow-up visits. This information is not intended to replace advice given to you by your health care provider. Make sure you discuss any questions you have with your health care provider. Document Revised: 02/07/2020 Document Reviewed: 02/07/2020 Elsevier Patient Education  [place].

## 2020-12-26 NOTE — Assessment & Plan Note (Signed)
On Eliquis.  See discussion.

## 2020-12-26 NOTE — Progress Notes (Signed)
Subjective:  Patient ID: [name], male    DOB: Aug 18, 1935  Age: 86 y.o. MRN: [id number]  CC: Follow-up (F/U ON SPOT ON (L) HAND)   HPI [name] presents for reassessment of his left hand nodular lesion that seems to be getting worse.  He was referred to dermatology but the earliest appointment is available for June 2022.  He is wondering what to do  Outpatient Medications Prior to Visit  Medication Sig Dispense Refill  . aspirin 81 MG chewable tablet CHEW ONE TABLET BY MOUTH DAILY AS NEEDED    . ibuprofen (ADVIL) 600 MG tablet TAKE ONE TABLET BY MOUTH  PRN    . metoprolol tartrate (LOPRESSOR) 50 MG tablet TAKE ONE-HALF TABLET BY MOUTH    . Accu-Chek Softclix Lancets lancets Use to check blood sugar daily DX:E11.59 100 each 3  . albuterol (VENTOLIN HFA) 108 (90 Base) MCG/ACT inhaler TAKE 2 PUFFS BY MOUTH EVERY 6 HOURS AS NEEDED FOR WHEEZE OR SHORTNESS OF BREATH 6.7 each 2  . amiodarone (PACERONE) 200 MG tablet TAKE 1 TABLET BY MOUTH EVERY DAY (Patient taking differently: Take 200 mg by mouth daily.) 90 tablet 2  . bisacodyl (DULCOLAX) 5 MG EC tablet Take 5 mg by mouth daily as needed for moderate constipation.    . Blood Glucose Monitoring Suppl (ACCU-CHEK AVIVA PLUS) w/Device KIT Use to check blood sugar daily DX:E11.59 1 kit 0  . cholecalciferol (VITAMIN D) 1000 units tablet Take 1,000 Units by mouth 2 (two) times daily.    Marland Kitchen docusate sodium (COLACE) 100 MG capsule Take 300 mg by mouth at bedtime.    Marland Kitchen ELIQUIS 5 MG TABS tablet TAKE 1 TABLET BY MOUTH TWICE A DAY (Patient taking differently: Take 5 mg by mouth 2 (two) times daily.) 60 tablet 5  . gabapentin (NEURONTIN) 100 MG capsule TAKE 1 CAPSULE (100 MG TOTAL) BY MOUTH 3 (THREE) TIMES DAILY. OFFICE VISIT NEEDED (Patient taking differently: Take 100 mg by mouth 3 (three) times daily.) 270 capsule 3  . glucose blood (ACCU-CHEK AVIVA PLUS) test strip Use to check blood sugar daily DX:E11.59 100 each 3  .  latanoprost (XALATAN) 0.005 % ophthalmic solution Place 1 drop into both eyes daily.    . metFORMIN (GLUCOPHAGE) 500 MG tablet Take 1 tablet (500 mg total) by mouth 3 (three) times daily. Do not take today 08/07/2020.  Start taking again from tomorrow 08/08/2020.    . pantoprazole (PROTONIX) 40 MG tablet TAKE 1 TABLET BY MOUTH EVERY DAY (Patient taking differently: Take 40 mg by mouth daily.) 90 tablet 3  . Propylene Glycol 0.6 % SOLN Place 1 drop into both eyes 2 (two) times daily as needed (for dry eyes).    . repaglinide (PRANDIN) 2 MG tablet TAKE 1 TABLET BY MOUTH 3 TIMES DAILY BEFORE MEALS. (Patient taking differently: Take 2 mg by mouth 3 (three) times daily before meals.) 270 tablet 3  . simvastatin (ZOCOR) 10 MG tablet TAKE 1 TABLET BY MOUTH EVERY DAY (Patient taking differently: Take 10 mg by mouth daily at 6 PM.) 90 tablet 3  . Tiotropium Bromide-Olodaterol (STIOLTO RESPIMAT) 2.5-2.5 MCG/ACT AERS Inhale 2 puffs into the lungs daily. 4 g 0  . aspirin 81 MG tablet Take 81 mg by mouth daily.     . furosemide (LASIX) 40 MG tablet Take 1 tablet daily alternating with 1.5 tablets every other day 90 tablet 3  . metoprolol succinate (TOPROL-XL) 25 MG 24 hr tablet TAKE 1 TABLET  BY MOUTH EVERY DAY (Patient taking differently: Take 25 mg by mouth daily.) 90 tablet 2   No facility-administered medications prior to visit.    ROS: Review of Systems  Constitutional: Negative for fever.  Skin: Positive for color change and rash.  Hematological: Bruises/bleeds easily.    Objective:  BP 122/68 (BP Location: Left Arm)   Pulse 74   Temp 97.9 F (36.6 C) (Oral)   SpO2 94%   BP Readings from Last 3 Encounters:  12/26/20 122/68  12/09/20 136/64  11/29/20 130/82    Wt Readings from Last 3 Encounters:  12/09/20 228 lb (103.4 kg)  11/29/20 223 lb (101.2 kg)  09/10/20 230 lb (104.3 kg)    Physical Exam Constitutional:      Appearance: He is obese. He is not ill-appearing.  Skin:    Findings:  Erythema, lesion and rash present.  Neurological:     Mental Status: He is oriented to person, place, and time.  Psychiatric:        Mood and Affect: Mood normal.    There is a nodular oval lesion on the left dorsal hand measuring around 21 x 18 x 6 mm with a scab in the middle. It is purplish in color with a scab in the middle.  No discharge.  It is surrounded by an area of erythematous scaly papular rash Lab Results  Component Value Date   WBC 7.9 08/21/2020   HGB 11.7 (L) 08/21/2020   HCT 38.0 08/21/2020   PLT 365 08/21/2020   GLUCOSE 95 11/29/2020   CHOL 126 08/09/2020   TRIG 149 08/09/2020   HDL 30 (L) 08/09/2020   LDLCALC 70 08/09/2020   ALT 18 08/15/2020   AST 16 08/15/2020   NA 140 11/29/2020   K 4.0 11/29/2020   CL 103 11/29/2020   CREATININE 1.44 11/29/2020   BUN 22 11/29/2020   CO2 26 11/29/2020   TSH 3.740 08/09/2020   PSA 1.18 07/02/2017   INR 2.3 03/03/2012   HGBA1C 6.9 (H) 11/29/2020   MICROALBUR 0.7 04/04/2018    CT ENTERO ABD/PELVIS W CONTAST  Result Date: 08/20/2020 CLINICAL DATA:  Follow-up small bowel wall thickening EXAM: CT ABDOMEN AND PELVIS WITH CONTRAST (ENTEROGRAPHY) TECHNIQUE: Multidetector CT of the abdomen and pelvis during bolus administration of intravenous contrast. Negative oral contrast was given. CONTRAST:  110m OMNIPAQUE IOHEXOL 300 MG/ML  SOLN COMPARISON:  CT abdomen/pelvis dated 08/05/2020. CT abdomen/pelvis dated 08/04/2020. FINDINGS: Lower chest: Mild subpleural reticulation/fibrosis at the lung bases. Hepatobiliary: Liver is within normal limits. Gallbladder is unremarkable. No intrahepatic or extrahepatic ductal dilatation. Pancreas: Within normal limits. Spleen: Within normal limits. Adrenals/Urinary Tract: Adrenal glands are within normal limits. 8 mm lateral left upper pole renal cyst (series 8/image 16). Right kidney is within normal limits. No hydronephrosis. Bladder is within normal limits, although partially obscured by streak  artifact. Stomach/Bowel: Stomach is within normal limits. No evidence of bowel obstruction. No small bowel wall thickening or mass is evident on CT. Specifically, the prior segment of abnormal small bowel beneath the right mid anterior abdominal wall is no longer evident. Normal appendix (series 2/image 81). No colonic wall thickening or inflammatory changes. Vascular/Lymphatic: No evidence of abdominal aortic aneurysm. Atherosclerotic calcifications of the abdominal aorta and branch vessels. No suspicious abdominopelvic lymphadenopathy. Reproductive: Prostate is grossly unremarkable, although partially obscured by streak artifact. Other: No abdominopelvic ascites. Musculoskeletal: Left hip arthroplasty, without evidence of complication. Mild degenerative changes of the visualized thoracolumbar spine. Median sternotomy. IMPRESSION: No small  bowel wall thickening or mass is evident on CT. Specifically, the prior segment of abnormal small bowel beneath the right mid anterior abdominal wall is no longer evident. Presumably, this reflected infectious/inflammatory small bowel enteritis, which has now resolved. Additional stable ancillary findings as above. Electronically Signed   By: Julian Hy M.D.   On: 08/20/2020 16:50    Assessment & Plan:   [name] was seen today for follow-up.  Diagnoses and all orders for this visit:  Sebaceous cyst of finger of left hand -     Dermatology pathology -     Dermabond -     lidocaine-EPINEPHrine (XYLOCAINE W/EPI) 2 %-1:100000 (with pres) injection 2 mL  Other orders -     doxycycline (VIBRA-TABS) 100 MG tablet; Take 1 tablet (100 mg total) by mouth 2 (two) times daily.     Meds ordered this encounter  Medications  . doxycycline (VIBRA-TABS) 100 MG tablet    Sig: Take 1 tablet (100 mg total) by mouth 2 (two) times daily.    Dispense:  14 tablet    Refill:  0  . lidocaine-EPINEPHrine (XYLOCAINE W/EPI) 2 %-1:100000 (with pres) injection 2 mL      Follow-up: No follow-ups on file.  Walker Kehr, MDProcedure - Excisional biopsy: Indication: Rule out skin cancer Risks including bleeding on Eliquis, infection, incomplete removal and other were discussed. Consent was signed.  Lesion #1 on left hand measuring 18 x 21 mm   Skin over lesion #1  was prepped with Betadine and alcohol  and anesthetized with 2 cc of 2% lidocaine and epinephrine, using a 25-gauge 1 inch needle. Excisional biopsy with a sterile #11 blade was carried out on the side of the lesion attempting to obtain a biopsy specimen.  Upon entering the lesion was discovered that it is filled with cottage cheeselike material.  At this point I decided to excise the scalp top of the lesion and clean up the content.  The specimen was sent to pathology lab.  The wound base was cleaned with leftovers of lidocaine with epinephrine, dried with sterile gauze.  Then derma glue applied to close the edges. Band-Aid was applied with antibiotic ointment. Tolerated well. Complications - none The patient was instructed to keep it clean.  He will take doxycycline orally for 7 days.  He will use triamcinolone cream or ointment for surrounding rash. Coban dressing applied

## 2020-12-26 NOTE — Assessment & Plan Note (Addendum)
I was to remove the lesion completely because the patient is on Eliquis.  I attempted to obtain a skin biopsy.  During the procedure it appeared that we are dealing with the sebaceous cyst most likely.  I elected to remove it completely.  Please see the procedure.  Bleeding was controlled.  Specimen sent to the lab.   Skin over lesion #1  was prepped with Betadine and alcohol  and anesthetized with 2 cc of 2% lidocaine and epinephrine, using a 25-gauge 1 inch needle. Excisional biopsy with a sterile #11 blade was carried out on the side of the lesion attempting to obtain a biopsy specimen.  Upon entering the lesion was discovered that it is filled with cottage cheeselike material.  At this point I decided to excise the scalp top of the lesion and clean up the content.  The specimen was sent to pathology lab.  The wound base was cleaned with leftovers of lidocaine with epinephrine, dried with sterile gauze.  Then derma glue applied to close the edges. Band-Aid was applied with antibiotic ointment. Tolerated well. Complications - none The patient was instructed to keep it clean.  He will take doxycycline orally for 7 days.  He will use triamcinolone cream or ointment for surrounding rash. Coban dressing applied  Oral doxycycline prescribed

## 2021-01-05 ENCOUNTER — Telehealth: Payer: Self-pay | Admitting: Internal Medicine

## 2021-01-05 NOTE — Telephone Encounter (Signed)
After review of amiodarone I would recomm holding Sunday   Take 200 mg on other days  Pt needs referral to derm.   Hx of problem   (I cannot remember specific, but subacute)

## 2021-01-06 MED ORDER — AMIODARONE HCL 200 MG PO TABS: 200.0000 mg | ORAL_TABLET | Freq: Every day | ORAL | 3 refills | Status: DC

## 2021-01-06 NOTE — Telephone Encounter (Signed)
Pt aware to take amiodarone 200 mg daily.  He said Dr. Alain Marion biopsied the area on his hand.  He said he thinks it was a cyst.  It is healing per the patient.  No referral has been placed at this time.

## 2021-01-06 NOTE — Addendum Note (Signed)
Addended by: Rodman Key on: 01/06/2021 11:47 AM   Modules accepted: Orders

## 2021-01-07 ENCOUNTER — Other Ambulatory Visit: Payer: Self-pay | Admitting: Internal Medicine

## 2021-01-12 ENCOUNTER — Other Ambulatory Visit: Payer: Self-pay | Admitting: Internal Medicine

## 2021-01-12 DIAGNOSIS — C4492 Squamous cell carcinoma of skin, unspecified: Secondary | ICD-10-CM | POA: Insufficient documentation

## 2021-01-30 DIAGNOSIS — C44629 Squamous cell carcinoma of skin of left upper limb, including shoulder: Secondary | ICD-10-CM | POA: Diagnosis not present

## 2021-02-10 DIAGNOSIS — C44629 Squamous cell carcinoma of skin of left upper limb, including shoulder: Secondary | ICD-10-CM | POA: Diagnosis not present

## 2021-02-22 ENCOUNTER — Other Ambulatory Visit: Payer: Self-pay | Admitting: Internal Medicine

## 2021-02-25 ENCOUNTER — Other Ambulatory Visit: Payer: Self-pay | Admitting: Internal Medicine

## 2021-02-26 ENCOUNTER — Other Ambulatory Visit: Payer: Self-pay

## 2021-02-27 ENCOUNTER — Encounter: Payer: Self-pay | Admitting: Internal Medicine

## 2021-02-27 ENCOUNTER — Ambulatory Visit (INDEPENDENT_AMBULATORY_CARE_PROVIDER_SITE_OTHER): Payer: Medicare Other | Admitting: Internal Medicine

## 2021-02-27 ENCOUNTER — Other Ambulatory Visit: Payer: Self-pay

## 2021-02-27 VITALS — BP 128/62 | HR 77 | Temp 98.3°F | Ht 68.0 in | Wt 228.0 lb

## 2021-02-27 DIAGNOSIS — E785 Hyperlipidemia, unspecified: Secondary | ICD-10-CM | POA: Diagnosis not present

## 2021-02-27 DIAGNOSIS — E1169 Type 2 diabetes mellitus with other specified complication: Secondary | ICD-10-CM | POA: Diagnosis not present

## 2021-02-27 DIAGNOSIS — I480 Paroxysmal atrial fibrillation: Secondary | ICD-10-CM | POA: Diagnosis not present

## 2021-02-27 DIAGNOSIS — I700 Atherosclerosis of aorta: Secondary | ICD-10-CM | POA: Diagnosis not present

## 2021-02-27 DIAGNOSIS — I10 Essential (primary) hypertension: Secondary | ICD-10-CM | POA: Diagnosis not present

## 2021-02-27 DIAGNOSIS — I2510 Atherosclerotic heart disease of native coronary artery without angina pectoris: Secondary | ICD-10-CM

## 2021-02-27 DIAGNOSIS — E1159 Type 2 diabetes mellitus with other circulatory complications: Secondary | ICD-10-CM | POA: Diagnosis not present

## 2021-02-27 LAB — COMPREHENSIVE METABOLIC PANEL
ALT: 14 U/L (ref 0–53)
AST: 18 U/L (ref 0–37)
Albumin: 3.8 g/dL (ref 3.5–5.2)
Alkaline Phosphatase: 45 U/L (ref 39–117)
BUN: 25 mg/dL — ABNORMAL HIGH (ref 6–23)
CO2: 27 mEq/L (ref 19–32)
Calcium: 9.1 mg/dL (ref 8.4–10.5)
Chloride: 106 mEq/L (ref 96–112)
Creatinine, Ser: 1.63 mg/dL — ABNORMAL HIGH (ref 0.40–1.50)
GFR: 38.57 mL/min — ABNORMAL LOW (ref >60.00)
Glucose, Bld: 147 mg/dL — ABNORMAL HIGH (ref 70–99)
Potassium: 4.1 mEq/L (ref 3.5–5.1)
Sodium: 142 mEq/L (ref 135–145)
Total Bilirubin: 0.4 mg/dL (ref 0.2–1.2)
Total Protein: 7.2 g/dL (ref 6.0–8.3)

## 2021-02-27 LAB — HEMOGLOBIN A1C: Hgb A1c MFr Bld: 7.5 % — ABNORMAL HIGH (ref 4.6–6.5)

## 2021-02-27 NOTE — Assessment & Plan Note (Signed)
Cont w/Toprol, daily baby aspirin, amiodarone, Eliquis

## 2021-02-27 NOTE — Assessment & Plan Note (Signed)
Toprol, Furosemide

## 2021-02-27 NOTE — Progress Notes (Signed)
Subjective:  Patient ID: [name], male    DOB: [birth year]  Age: 86 y.o. MRN: [id number]  CC: Follow-up (3 month f/u)   HPI [name] presents for PAF, LBP - spinal stenosis. Seeing Dr Nelva Bush next wk   Outpatient Medications Prior to Visit  Medication Sig Dispense Refill  . Accu-Chek Softclix Lancets lancets Use to check blood sugar daily DX:E11.59 100 each 3  . albuterol (VENTOLIN HFA) 108 (90 Base) MCG/ACT inhaler TAKE 2 PUFFS BY MOUTH EVERY 6 HOURS AS NEEDED FOR WHEEZE OR SHORTNESS OF BREATH 6.7 each 2  . amiodarone (PACERONE) 200 MG tablet Take 1 tablet (200 mg total) by mouth daily. Except Sunday. 90 tablet 3  . aspirin 81 MG chewable tablet CHEW ONE TABLET BY MOUTH DAILY AS NEEDED    . bisacodyl (DULCOLAX) 5 MG EC tablet Take 5 mg by mouth daily as needed for moderate constipation.    . Blood Glucose Monitoring Suppl (ACCU-CHEK AVIVA PLUS) w/Device KIT Use to check blood sugar daily DX:E11.59 1 kit 0  . cholecalciferol (VITAMIN D) 1000 units tablet Take 1,000 Units by mouth 2 (two) times daily.    Marland Kitchen docusate sodium (COLACE) 100 MG capsule Take 300 mg by mouth at bedtime.    Marland Kitchen ELIQUIS 5 MG TABS tablet TAKE 1 TABLET BY MOUTH TWICE A DAY (Patient taking differently: Take 5 mg by mouth 2 (two) times daily.) 60 tablet 5  . furosemide (LASIX) 40 MG tablet Take 1 tablet daily alternating with 1.5 tablets every other day 135 tablet 3  . gabapentin (NEURONTIN) 100 MG capsule TAKE 1 CAPSULE (100 MG TOTAL) BY MOUTH 3 (THREE) TIMES DAILY. OFFICE VISIT NEEDED (Patient taking differently: Take 100 mg by mouth 3 (three) times daily.) 270 capsule 3  . glucose blood (ACCU-CHEK AVIVA PLUS) test strip Use to check blood sugar daily DX:E11.59 100 each 3  . ibuprofen (ADVIL) 600 MG tablet TAKE ONE TABLET BY MOUTH  PRN    . latanoprost (XALATAN) 0.005 % ophthalmic solution Place 1 drop into both eyes daily.    . metFORMIN (GLUCOPHAGE) 500 MG tablet TAKE 1 TABLET BY MOUTH THREE TIMES A  DAY 270 tablet 3  . metoprolol tartrate (LOPRESSOR) 50 MG tablet TAKE ONE-HALF TABLET BY MOUTH    . pantoprazole (PROTONIX) 40 MG tablet TAKE 1 TABLET BY MOUTH EVERY DAY 90 tablet 3  . Propylene Glycol 0.6 % SOLN Place 1 drop into both eyes 2 (two) times daily as needed (for dry eyes).    . repaglinide (PRANDIN) 2 MG tablet TAKE 1 TABLET BY MOUTH 3 TIMES DAILY BEFORE MEALS. (Patient taking differently: Take 2 mg by mouth 3 (three) times daily before meals.) 270 tablet 3  . simvastatin (ZOCOR) 10 MG tablet TAKE 1 TABLET BY MOUTH EVERY DAY 90 tablet 3  . Tiotropium Bromide-Olodaterol (STIOLTO RESPIMAT) 2.5-2.5 MCG/ACT AERS Inhale 2 puffs into the lungs daily. 4 g 0  . doxycycline (VIBRA-TABS) 100 MG tablet Take 1 tablet (100 mg total) by mouth 2 (two) times daily. (Patient not taking: Reported on 02/27/2021) 14 tablet 0   No facility-administered medications prior to visit.    ROS: Review of Systems  Constitutional: Positive for fatigue. Negative for appetite change and unexpected weight change.  HENT: Negative for congestion, nosebleeds, sneezing, sore throat and trouble swallowing.   Eyes: Negative for itching and visual disturbance.  Respiratory: Positive for shortness of breath. Negative for cough.   Cardiovascular: Negative for chest pain, palpitations and leg  swelling.  Gastrointestinal: Negative for abdominal distention, blood in stool, diarrhea and nausea.  Genitourinary: Negative for frequency and hematuria.  Musculoskeletal: Negative for back pain, gait problem, joint swelling and neck pain.  Skin: Negative for rash.  Neurological: Negative for dizziness, tremors, speech difficulty and weakness.  Psychiatric/Behavioral: Negative for agitation, dysphoric mood and sleep disturbance. The patient is not nervous/anxious.     Objective:  BP 128/62 (BP Location: Left Arm)   Pulse 77   Temp 98.3 F (36.8 C) (Oral)   Ht '5\' 8"'  (1.727 m)   Wt 228 lb (103.4 kg)   SpO2 95%   BMI 34.67  kg/m   BP Readings from Last 3 Encounters:  02/27/21 128/62  12/26/20 122/68  12/09/20 136/64    Wt Readings from Last 3 Encounters:  02/27/21 228 lb (103.4 kg)  12/09/20 228 lb (103.4 kg)  11/29/20 223 lb (101.2 kg)    Physical Exam Constitutional:      General: He is not in acute distress.    Appearance: He is well-developed. He is obese.     Comments: NAD  Eyes:     Conjunctiva/sclera: Conjunctivae normal.     Pupils: Pupils are equal, round, and reactive to light.  Neck:     Thyroid: No thyromegaly.     Vascular: No JVD.  Cardiovascular:     Rate and Rhythm: Normal rate. Rhythm irregular.     Heart sounds: Normal heart sounds. No murmur heard. No friction rub. No gallop.   Pulmonary:     Effort: Pulmonary effort is normal. No respiratory distress.     Breath sounds: Normal breath sounds. No wheezing or rales.  Chest:     Chest wall: No tenderness.  Abdominal:     General: Bowel sounds are normal. There is no distension.     Palpations: Abdomen is soft. There is no mass.     Tenderness: There is no abdominal tenderness. There is no guarding or rebound.  Musculoskeletal:        General: No tenderness. Normal range of motion.     Cervical back: Normal range of motion.  Lymphadenopathy:     Cervical: No cervical adenopathy.  Skin:    General: Skin is warm and dry.     Findings: No rash.  Neurological:     Mental Status: He is alert and oriented to person, place, and time.     Cranial Nerves: No cranial nerve deficit.     Motor: No abnormal muscle tone.     Coordination: Coordination normal.     Gait: Gait normal.     Deep Tendon Reflexes: Reflexes are normal and symmetric.  Psychiatric:        Behavior: Behavior normal.        Thought Content: Thought content normal.        Judgment: Judgment normal.     Lab Results  Component Value Date   WBC 7.9 08/21/2020   HGB 11.7 (L) 08/21/2020   HCT 38.0 08/21/2020   PLT 365 08/21/2020   GLUCOSE 95 11/29/2020    CHOL 126 08/09/2020   TRIG 149 08/09/2020   HDL 30 (L) 08/09/2020   LDLCALC 70 08/09/2020   ALT 18 08/15/2020   AST 16 08/15/2020   NA 140 11/29/2020   K 4.0 11/29/2020   CL 103 11/29/2020   CREATININE 1.44 11/29/2020   BUN 22 11/29/2020   CO2 26 11/29/2020   TSH 3.740 08/09/2020   PSA 1.18 07/02/2017   INR  2.3 03/03/2012   HGBA1C 6.9 (H) 11/29/2020   MICROALBUR 0.7 04/04/2018    CT ENTERO ABD/PELVIS W CONTAST  Result Date: 08/20/2020 CLINICAL DATA:  Follow-up small bowel wall thickening EXAM: CT ABDOMEN AND PELVIS WITH CONTRAST (ENTEROGRAPHY) TECHNIQUE: Multidetector CT of the abdomen and pelvis during bolus administration of intravenous contrast. Negative oral contrast was given. CONTRAST:  155m OMNIPAQUE IOHEXOL 300 MG/ML  SOLN COMPARISON:  CT abdomen/pelvis dated 08/05/2020. CT abdomen/pelvis dated 08/04/2020. FINDINGS: Lower chest: Mild subpleural reticulation/fibrosis at the lung bases. Hepatobiliary: Liver is within normal limits. Gallbladder is unremarkable. No intrahepatic or extrahepatic ductal dilatation. Pancreas: Within normal limits. Spleen: Within normal limits. Adrenals/Urinary Tract: Adrenal glands are within normal limits. 8 mm lateral left upper pole renal cyst (series 8/image 16). Right kidney is within normal limits. No hydronephrosis. Bladder is within normal limits, although partially obscured by streak artifact. Stomach/Bowel: Stomach is within normal limits. No evidence of bowel obstruction. No small bowel wall thickening or mass is evident on CT. Specifically, the prior segment of abnormal small bowel beneath the right mid anterior abdominal wall is no longer evident. Normal appendix (series 2/image 81). No colonic wall thickening or inflammatory changes. Vascular/Lymphatic: No evidence of abdominal aortic aneurysm. Atherosclerotic calcifications of the abdominal aorta and branch vessels. No suspicious abdominopelvic lymphadenopathy. Reproductive: Prostate is  grossly unremarkable, although partially obscured by streak artifact. Other: No abdominopelvic ascites. Musculoskeletal: Left hip arthroplasty, without evidence of complication. Mild degenerative changes of the visualized thoracolumbar spine. Median sternotomy. IMPRESSION: No small bowel wall thickening or mass is evident on CT. Specifically, the prior segment of abnormal small bowel beneath the right mid anterior abdominal wall is no longer evident. Presumably, this reflected infectious/inflammatory small bowel enteritis, which has now resolved. Additional stable ancillary findings as above. Electronically Signed   By: SJulian HyM.D.   On: 08/20/2020 16:50    Assessment & Plan:    AWalker Kehr MD

## 2021-03-03 DIAGNOSIS — I700 Atherosclerosis of aorta: Secondary | ICD-10-CM | POA: Insufficient documentation

## 2021-03-03 DIAGNOSIS — M47816 Spondylosis without myelopathy or radiculopathy, lumbar region: Secondary | ICD-10-CM | POA: Diagnosis not present

## 2021-03-03 NOTE — Assessment & Plan Note (Signed)
Cont w/ASA, Amlodipine, Lopressor, Zocor

## 2021-03-03 NOTE — Assessment & Plan Note (Signed)
Simvastatin, diet

## 2021-03-03 NOTE — Assessment & Plan Note (Signed)
On Metformin, Prandin BMET, A1c q 3 mo

## 2021-03-03 NOTE — Assessment & Plan Note (Signed)
On Simvastatin 

## 2021-03-14 DIAGNOSIS — M47816 Spondylosis without myelopathy or radiculopathy, lumbar region: Secondary | ICD-10-CM | POA: Insufficient documentation

## 2021-03-25 ENCOUNTER — Other Ambulatory Visit: Payer: Self-pay | Admitting: *Deleted

## 2021-03-25 ENCOUNTER — Other Ambulatory Visit: Payer: Medicare Other | Admitting: *Deleted

## 2021-03-25 DIAGNOSIS — R0602 Shortness of breath: Secondary | ICD-10-CM

## 2021-03-26 LAB — CBC
Hematocrit: 30.6 % — ABNORMAL LOW (ref 37.5–51.0)
Hemoglobin: 9.1 g/dL — ABNORMAL LOW (ref 13.0–17.7)
MCH: 22.3 pg — ABNORMAL LOW (ref 26.6–33.0)
MCHC: 29.7 g/dL — ABNORMAL LOW (ref 31.5–35.7)
MCV: 75 fL — ABNORMAL LOW (ref 79–97)
Platelets: 372 x10E3/uL (ref 150–450)
RBC: 4.08 x10E6/uL — ABNORMAL LOW (ref 4.14–5.80)
RDW: 16 % — ABNORMAL HIGH (ref 11.6–15.4)
WBC: 6.1 x10E3/uL (ref 3.4–10.8)

## 2021-03-26 LAB — BASIC METABOLIC PANEL
BUN/Creatinine Ratio: 16 (ref 10–24)
BUN: 23 mg/dL (ref 8–27)
CO2: 19 mmol/L — ABNORMAL LOW (ref 20–29)
Calcium: 9.2 mg/dL (ref 8.6–10.2)
Chloride: 102 mmol/L (ref 96–106)
Creatinine, Ser: 1.45 mg/dL — ABNORMAL HIGH (ref 0.76–1.27)
Glucose: 127 mg/dL — ABNORMAL HIGH (ref 65–99)
Potassium: 4.1 mmol/L (ref 3.5–5.2)
Sodium: 139 mmol/L (ref 134–144)
eGFR: 48 mL/min/{1.73_m2} — ABNORMAL LOW (ref >59)

## 2021-03-26 LAB — PRO B NATRIURETIC PEPTIDE: NT-Pro BNP: 742 pg/mL — ABNORMAL HIGH (ref 0–486)

## 2021-03-27 ENCOUNTER — Telehealth: Payer: Self-pay | Admitting: Internal Medicine

## 2021-03-27 DIAGNOSIS — D649 Anemia, unspecified: Secondary | ICD-10-CM

## 2021-03-27 DIAGNOSIS — E782 Mixed hyperlipidemia: Secondary | ICD-10-CM

## 2021-03-27 DIAGNOSIS — I480 Paroxysmal atrial fibrillation: Secondary | ICD-10-CM

## 2021-03-27 DIAGNOSIS — I2510 Atherosclerotic heart disease of native coronary artery without angina pectoris: Secondary | ICD-10-CM

## 2021-03-27 DIAGNOSIS — R0602 Shortness of breath: Secondary | ICD-10-CM

## 2021-03-27 DIAGNOSIS — M47816 Spondylosis without myelopathy or radiculopathy, lumbar region: Secondary | ICD-10-CM | POA: Diagnosis not present

## 2021-03-27 DIAGNOSIS — Z79899 Other long term (current) drug therapy: Secondary | ICD-10-CM

## 2021-03-27 NOTE — Telephone Encounter (Signed)
Pt brought his wife into clinic for appt on Tuesday She said he was doing not so good SOB with activity    His Lungs were Clear    Cardiac exam  RRR  N oS3    Abd supple   Ext without edemia  Labs drawn    See result note for recommm  1.  ANemia panel 2  Stop ASA  Continue Eliiquis 3  Lexiscan myovue

## 2021-03-27 NOTE — Telephone Encounter (Signed)
Pt advised and agrees to a stress test and will stop his ASA.   Pt verbalized understanding of his Lexiscan instructions and will call if se has any further questions.   He plans to have his labs the same day as his stress test.

## 2021-03-27 NOTE — Addendum Note (Signed)
Addended by: Stephani Police on: 03/27/2021 10:10 AM   Modules accepted: Orders

## 2021-03-27 NOTE — Addendum Note (Signed)
Addended by: Fay Records on: 03/27/2021 10:21 AM   Modules accepted: Orders

## 2021-03-27 NOTE — Telephone Encounter (Signed)
This procedure has been fully reviewed with the patient and written informed consent has been obtained.

## 2021-04-01 ENCOUNTER — Telehealth (HOSPITAL_COMMUNITY): Payer: Self-pay

## 2021-04-01 NOTE — Telephone Encounter (Signed)
Spoke with the patient, detailed instructions given. He stated that he would be here for his test. Asked to call back with any questions. [name] EMTP 

## 2021-04-03 ENCOUNTER — Other Ambulatory Visit: Payer: Medicare Other

## 2021-04-03 ENCOUNTER — Other Ambulatory Visit: Payer: Self-pay

## 2021-04-03 ENCOUNTER — Ambulatory Visit (HOSPITAL_COMMUNITY): Payer: Medicare Other | Attending: Cardiology

## 2021-04-03 DIAGNOSIS — E782 Mixed hyperlipidemia: Secondary | ICD-10-CM | POA: Diagnosis not present

## 2021-04-03 DIAGNOSIS — I480 Paroxysmal atrial fibrillation: Secondary | ICD-10-CM | POA: Insufficient documentation

## 2021-04-03 DIAGNOSIS — Z79899 Other long term (current) drug therapy: Secondary | ICD-10-CM | POA: Diagnosis not present

## 2021-04-03 DIAGNOSIS — R0602 Shortness of breath: Secondary | ICD-10-CM | POA: Insufficient documentation

## 2021-04-03 DIAGNOSIS — I2510 Atherosclerotic heart disease of native coronary artery without angina pectoris: Secondary | ICD-10-CM

## 2021-04-03 DIAGNOSIS — D649 Anemia, unspecified: Secondary | ICD-10-CM | POA: Insufficient documentation

## 2021-04-03 LAB — MYOCARDIAL PERFUSION IMAGING
LV dias vol: 119 mL (ref 62–150)
LV sys vol: 71 mL
Peak HR: 72 {beats}/min
Rest HR: 61 {beats}/min
SDS: 6
SRS: 0
SSS: 6
TID: 1.07

## 2021-04-03 MED ORDER — REGADENOSON 0.4 MG/5ML IV SOLN
0.4000 mg | Freq: Once | INTRAVENOUS | Status: AC
  Administered 2021-04-03: 0.4 mg via INTRAVENOUS

## 2021-04-03 MED ORDER — TECHNETIUM TC 99M TETROFOSMIN IV KIT
30.7000 | PACK | Freq: Once | INTRAVENOUS | Status: AC | PRN
Start: 2021-04-03 — End: 2021-04-03
  Administered 2021-04-03: 30.7 via INTRAVENOUS
  Filled 2021-04-03: qty 31

## 2021-04-03 MED ORDER — TECHNETIUM TC 99M TETROFOSMIN IV KIT
10.1000 | PACK | Freq: Once | INTRAVENOUS | Status: AC | PRN
Start: 2021-04-03 — End: 2021-04-03
  Administered 2021-04-03: 10.1 via INTRAVENOUS
  Filled 2021-04-03: qty 11

## 2021-04-04 ENCOUNTER — Telehealth: Payer: Self-pay | Admitting: *Deleted

## 2021-04-04 ENCOUNTER — Other Ambulatory Visit: Payer: Medicare Other

## 2021-04-04 DIAGNOSIS — R0602 Shortness of breath: Secondary | ICD-10-CM

## 2021-04-04 LAB — RETICULOCYTES: Retic Ct Pct: 1.7 % (ref 0.6–2.6)

## 2021-04-04 LAB — IRON AND TIBC
Iron Saturation: 5 % — CL (ref 15–55)
Iron: 24 ug/dL — ABNORMAL LOW (ref 38–169)
Total Iron Binding Capacity: 452 ug/dL — ABNORMAL HIGH (ref 250–450)
UIBC: 428 ug/dL — ABNORMAL HIGH (ref 111–343)

## 2021-04-04 LAB — VITAMIN B12: Vitamin B-12: 197 pg/mL — ABNORMAL LOW (ref 232–1245)

## 2021-04-04 LAB — FOLATE: Folate: 10 ng/mL (ref >3.0)

## 2021-04-04 LAB — FERRITIN: Ferritin: 14 ng/mL — ABNORMAL LOW (ref 30–400)

## 2021-04-04 MED ORDER — FERROUS SULFATE 325 (65 FE) MG PO TABS: 325.0000 mg | ORAL_TABLET | Freq: Two times a day (BID) | ORAL | 6 refills | Status: AC

## 2021-04-04 MED ORDER — CYANOCOBALAMIN 250 MCG PO TABS: 500.0000 ug | ORAL_TABLET | Freq: Every day | ORAL | Status: AC

## 2021-04-04 NOTE — Telephone Encounter (Addendum)
I spoke with the patient.  I scheduled his lab STAT on Monday and his echo Monday as well.  He is SOB and tired.  He said he has been doing some work around the house when I called.  Adv of plan for labs and echo Monday.  He will pick up and begin taking iron BID and B12 daily.  Continues protonix.  denies any bleeding.  Adv  to call our office or go to ER for any active bleeding over the weekend.  Pt voices understanding and agreement.     ----- Message from Fay Records, MD sent at 04/04/2021  9:04 AM EDT ----- Pt with Fe deficient anemia   B12 also low (mild)  Pt denies signs of blood loss    Would recomm 325 FeSO4 bid    B12:   Add 530mcg per day    Would repeat CBC and BMET   (Cr was 1.45   May need to back down on Eliquis    Continue protonnix   Will forward to A [name]  ______________________________________________________________ Reviewed with patient  Inferior changes are different   Pt is currently anemic (Hgb 9) with Fe deficient He does have some tightnesss, SOB with activity  Anemia may be exacerbating even if no change in anatomy    I told the pt that would consider heart catheterization potentially but only when anemia clarified Would recomm echo to redefine LVEF

## 2021-04-07 ENCOUNTER — Other Ambulatory Visit: Payer: Self-pay

## 2021-04-07 ENCOUNTER — Ambulatory Visit (HOSPITAL_COMMUNITY): Payer: Medicare Other | Attending: Cardiovascular Disease

## 2021-04-07 ENCOUNTER — Other Ambulatory Visit: Payer: Medicare Other | Admitting: *Deleted

## 2021-04-07 DIAGNOSIS — R0602 Shortness of breath: Secondary | ICD-10-CM

## 2021-04-07 LAB — CBC
Hematocrit: 31.4 % — ABNORMAL LOW (ref 37.5–51.0)
Hemoglobin: 9.3 g/dL — ABNORMAL LOW (ref 13.0–17.7)
MCH: 21.7 pg — ABNORMAL LOW (ref 26.6–33.0)
MCHC: 29.6 g/dL — ABNORMAL LOW (ref 31.5–35.7)
MCV: 73 fL — ABNORMAL LOW (ref 79–97)
Platelets: 357 10*3/uL (ref 150–450)
RBC: 4.29 x10E6/uL (ref 4.14–5.80)
RDW: 17.6 % — ABNORMAL HIGH (ref 11.6–15.4)
WBC: 8.1 10*3/uL (ref 3.4–10.8)

## 2021-04-07 LAB — BASIC METABOLIC PANEL
BUN/Creatinine Ratio: 16 (ref 10–24)
BUN: 22 mg/dL (ref 8–27)
CO2: 22 mmol/L (ref 20–29)
Calcium: 9.2 mg/dL (ref 8.6–10.2)
Chloride: 101 mmol/L (ref 96–106)
Creatinine, Ser: 1.37 mg/dL — ABNORMAL HIGH (ref 0.76–1.27)
Glucose: 145 mg/dL — ABNORMAL HIGH (ref 65–99)
Potassium: 3.9 mmol/L (ref 3.5–5.2)
Sodium: 137 mmol/L (ref 134–144)
eGFR: 51 mL/min/{1.73_m2} — ABNORMAL LOW (ref >59)

## 2021-04-07 LAB — ECHOCARDIOGRAM COMPLETE
Area-P 1/2: 3.03 cm2
MV M vel: 4.98 m/s
MV Peak grad: 99.2 mmHg
S' Lateral: 4.5 cm

## 2021-04-07 MED ORDER — PERFLUTREN LIPID MICROSPHERE
1.0000 mL | INTRAVENOUS | Status: AC | PRN
  Administered 2021-04-07: 2 mL via INTRAVENOUS

## 2021-04-07 NOTE — Progress Notes (Signed)
Patient ID: [name], male   DOB: 1935-10-19, 86 y.o.   MRN: [id number]  Echocardiogram 2D Echocardiogram has been performed.  Jennette Dubin 04/07/21

## 2021-04-09 ENCOUNTER — Telehealth: Payer: Self-pay | Admitting: *Deleted

## 2021-04-09 DIAGNOSIS — R0602 Shortness of breath: Secondary | ICD-10-CM

## 2021-04-09 DIAGNOSIS — D649 Anemia, unspecified: Secondary | ICD-10-CM

## 2021-04-09 NOTE — Telephone Encounter (Signed)
I spoke with patient and reviewed lab results and echo results with him.  He will come to office on June 8 for CBC

## 2021-04-09 NOTE — Telephone Encounter (Signed)
-----   Message from [name] V, MD sent at 04/08/2021 10:37 PM EDT ----- CBC is stable from 2 wks ago   9.3 now Continue Fe supplements Kidney function is stable   Cr is 1.37 Follow medically for now. Should have CBC in 2 wks

## 2021-04-10 ENCOUNTER — Other Ambulatory Visit (HOSPITAL_COMMUNITY): Payer: Self-pay | Admitting: Internal Medicine

## 2021-04-10 DIAGNOSIS — M5416 Radiculopathy, lumbar region: Secondary | ICD-10-CM | POA: Diagnosis not present

## 2021-04-10 NOTE — Telephone Encounter (Signed)
Pt last saw Dr Harrington Challenger 12/09/20, last labs 04/07/21 Creat 1.37, age 86, weight 103.4kg, based on specified criteria pt is on appropriate dosage of Eliquis 5mg  BID for afib.  Will refill rx.

## 2021-04-17 ENCOUNTER — Other Ambulatory Visit: Payer: Self-pay | Admitting: Internal Medicine

## 2021-04-22 ENCOUNTER — Ambulatory Visit: Payer: Medicare Other | Admitting: Dermatology

## 2021-04-23 ENCOUNTER — Other Ambulatory Visit: Payer: Medicare Other | Admitting: *Deleted

## 2021-04-23 ENCOUNTER — Other Ambulatory Visit: Payer: Self-pay

## 2021-04-23 DIAGNOSIS — D649 Anemia, unspecified: Secondary | ICD-10-CM

## 2021-04-23 DIAGNOSIS — R0602 Shortness of breath: Secondary | ICD-10-CM

## 2021-04-23 LAB — CBC
Hematocrit: 37.9 % (ref 37.5–51.0)
Hemoglobin: 11.5 g/dL — ABNORMAL LOW (ref 13.0–17.7)
MCH: 24.8 pg — ABNORMAL LOW (ref 26.6–33.0)
MCHC: 30.3 g/dL — ABNORMAL LOW (ref 31.5–35.7)
MCV: 82 fL (ref 79–97)
Platelets: 294 10*3/uL (ref 150–450)
RBC: 4.63 x10E6/uL (ref 4.14–5.80)
RDW: 24.6 % — ABNORMAL HIGH (ref 11.6–15.4)
WBC: 9.7 10*3/uL (ref 3.4–10.8)

## 2021-04-30 DIAGNOSIS — H353211 Exudative age-related macular degeneration, right eye, with active choroidal neovascularization: Secondary | ICD-10-CM | POA: Diagnosis not present

## 2021-04-30 DIAGNOSIS — H40021 Open angle with borderline findings, high risk, right eye: Secondary | ICD-10-CM | POA: Diagnosis not present

## 2021-04-30 DIAGNOSIS — H401121 Primary open-angle glaucoma, left eye, mild stage: Secondary | ICD-10-CM | POA: Diagnosis not present

## 2021-04-30 DIAGNOSIS — H524 Presbyopia: Secondary | ICD-10-CM | POA: Diagnosis not present

## 2021-04-30 DIAGNOSIS — H2511 Age-related nuclear cataract, right eye: Secondary | ICD-10-CM | POA: Diagnosis not present

## 2021-04-30 LAB — HM DIABETES EYE EXAM

## 2021-05-01 DIAGNOSIS — M5416 Radiculopathy, lumbar region: Secondary | ICD-10-CM | POA: Diagnosis not present

## 2021-05-02 ENCOUNTER — Encounter: Payer: Self-pay | Admitting: Internal Medicine

## 2021-05-12 ENCOUNTER — Ambulatory Visit (INDEPENDENT_AMBULATORY_CARE_PROVIDER_SITE_OTHER): Payer: Medicare Other | Admitting: Ophthalmology

## 2021-05-12 ENCOUNTER — Other Ambulatory Visit: Payer: Self-pay

## 2021-05-12 ENCOUNTER — Encounter (INDEPENDENT_AMBULATORY_CARE_PROVIDER_SITE_OTHER): Payer: Self-pay | Admitting: Ophthalmology

## 2021-05-12 DIAGNOSIS — H3561 Retinal hemorrhage, right eye: Secondary | ICD-10-CM

## 2021-05-12 DIAGNOSIS — H353132 Nonexudative age-related macular degeneration, bilateral, intermediate dry stage: Secondary | ICD-10-CM

## 2021-05-12 DIAGNOSIS — H353211 Exudative age-related macular degeneration, right eye, with active choroidal neovascularization: Secondary | ICD-10-CM | POA: Diagnosis not present

## 2021-05-12 DIAGNOSIS — E1159 Type 2 diabetes mellitus with other circulatory complications: Secondary | ICD-10-CM | POA: Diagnosis not present

## 2021-05-12 DIAGNOSIS — H35372 Puckering of macula, left eye: Secondary | ICD-10-CM | POA: Diagnosis not present

## 2021-05-12 MED ORDER — BEVACIZUMAB 2.5 MG/0.1ML IZ SOSY
2.5000 mg | PREFILLED_SYRINGE | INTRAVITREAL | Status: AC | PRN
  Administered 2021-05-12: 2.5 mg via INTRAVITREAL

## 2021-05-12 NOTE — Progress Notes (Signed)
05/12/2021     CHIEF COMPLAINT Patient presents for Retina Evaluation (NP- neovascular AMD- Dr. Cammie Mcgee states, "I had cat sx OS and I thought it was time to have my OD done because I cannot see out of it anymore and Dr. Kathlen Mody told me that it was something wrong with the back of my eye. I have a dark spot that is in the center of my vision wherever I look."/Type II DM: A1C:7.5    LBS: 130/Pt reports that he is not using Latanoprost every night. He states that he uses it in OD 'a couple of nights a week.")   HISTORY OF PRESENT ILLNESS: [name] is a 86 y.o. male who presents to the clinic today for:   HPI     Retina Evaluation           Laterality: both eyes   Onset: 1 month ago   Duration: 1 month   Associated Symptoms: Floaters (Stable OU), Blind Spot (I have a dark spot in the very center of my va OD) and Photophobia (I have bad senstivity to being outside. I cannot see to get around).  Negative for Flashes and Distortion   Comments: NP- neovascular AMD- Dr. Kathlen Mody Pt states, "I had cat sx OS and I thought it was time to have my OD done because I cannot see out of it anymore and Dr. Kathlen Mody told me that it was something wrong with the back of my eye. I have a dark spot that is in the center of my vision wherever I look." Type II DM: A1C:7.5    LBS: 130 Pt reports that he is not using Latanoprost every night. He states that he uses it in OD 'a couple of nights a week."       Last edited by Kendra Opitz, COA on 05/12/2021  3:24 PM.      Referring physician: Cassandria Anger[facility],  [place] [address]  HISTORICAL INFORMATION:   Selected notes from the MEDICAL RECORD NUMBER    Lab Results  Component Value Date   HGBA1C 7.5 (H) 02/27/2021     CURRENT MEDICATIONS: Current Outpatient Medications (Ophthalmic Drugs)  Medication Sig   latanoprost (XALATAN) 0.005 % ophthalmic solution Place 1 drop into both eyes daily.   Propylene Glycol  0.6 % SOLN Place 1 drop into both eyes 2 (two) times daily as needed (for dry eyes).   No current facility-administered medications for this visit. (Ophthalmic Drugs)   Current Outpatient Medications (Other)  Medication Sig   Accu-Chek Softclix Lancets lancets Use to check blood sugar daily DX:E11.59   albuterol (VENTOLIN HFA) 108 (90 Base) MCG/ACT inhaler TAKE 2 PUFFS BY MOUTH EVERY 6 HOURS AS NEEDED FOR WHEEZE OR SHORTNESS OF BREATH   amiodarone (PACERONE) 200 MG tablet Take 1 tablet (200 mg total) by mouth daily. Except Sunday.   bisacodyl (DULCOLAX) 5 MG EC tablet Take 5 mg by mouth daily as needed for moderate constipation.   Blood Glucose Monitoring Suppl (ACCU-CHEK AVIVA PLUS) w/Device KIT Use to check blood sugar daily DX:E11.59   cholecalciferol (VITAMIN D) 1000 units tablet Take 1,000 Units by mouth 2 (two) times daily.   docusate sodium (COLACE) 100 MG capsule Take 300 mg by mouth at bedtime.   ELIQUIS 5 MG TABS tablet TAKE 1 TABLET BY MOUTH TWICE A DAY   ferrous sulfate 325 (65 FE) MG tablet Take 1 tablet (325 mg total) by mouth 2 (two) times  daily with a meal.   furosemide (LASIX) 40 MG tablet Take 1 tablet daily alternating with 1.5 tablets every other day   gabapentin (NEURONTIN) 100 MG capsule Take 1 capsule (100 mg total) by mouth 3 (three) times daily.   glucose blood (ACCU-CHEK AVIVA PLUS) test strip Use to check blood sugar daily DX:E11.59   ibuprofen (ADVIL) 600 MG tablet TAKE ONE TABLET BY MOUTH  PRN   metFORMIN (GLUCOPHAGE) 500 MG tablet TAKE 1 TABLET BY MOUTH THREE TIMES A DAY   metoprolol tartrate (LOPRESSOR) 50 MG tablet TAKE ONE-HALF TABLET BY MOUTH   pantoprazole (PROTONIX) 40 MG tablet TAKE 1 TABLET BY MOUTH EVERY DAY   repaglinide (PRANDIN) 2 MG tablet TAKE 1 TABLET BY MOUTH 3 TIMES DAILY BEFORE MEALS. (Patient taking differently: Take 2 mg by mouth 3 (three) times daily before meals.)   simvastatin (ZOCOR) 10 MG tablet TAKE 1 TABLET BY MOUTH EVERY DAY    Tiotropium Bromide-Olodaterol (STIOLTO RESPIMAT) 2.5-2.5 MCG/ACT AERS Inhale 2 puffs into the lungs daily.   vitamin B-12 (CYANOCOBALAMIN) 250 MCG tablet Take 2 tablets (500 mcg total) by mouth daily.   No current facility-administered medications for this visit. (Other)      REVIEW OF SYSTEMS:    ALLERGIES No Known Allergies  PAST MEDICAL HISTORY Past Medical History:  Diagnosis Date   Acute diastolic CHF (congestive heart failure) ([place]) 08/04/2020   Arthritis    Atrial fibrillation ([place])    post op; amiodarone and coumadin continued for 3 mos post op   CARCINOMA, SKIN, SQUAMOUS CELL 01/07/2010   Cataract    beginning of cataracts   COLONIC POLYPS, HX OF 04/26/2007   Coronary artery disease    s/p CABG 3/12: L-LAD, S-CFX (Dr. Roxy Manns); EF 45% at cath prior to CABG   DIABETES MELLITUS, TYPE II 04/26/2007   Hyperlipidemia    HYPERTENSION 04/26/2007   NEPHROLITHIASIS, HX OF 04/26/2007   SBO (small bowel obstruction) ([place]) 10/2015   Partial    Shortness of breath dyspnea    with exertion   Unspecified hearing loss 12/18/2008   Past Surgical History:  Procedure Laterality Date   BACK SURGERY     disectomy   CARDIOVERSION N/A 06/08/2018   Procedure: CARDIOVERSION;  Surgeon: Buford Dresser, MD;  Location: [facility] ENDOSCOPY;  Service: Cardiovascular;  Laterality: N/A;   CARDIOVERSION N/A 07/25/2018   Procedure: CARDIOVERSION;  Surgeon: Fay Records, MD;  Location: [place];  Service: Cardiovascular;  Laterality: N/A;   COLONOSCOPY     CORONARY ARTERY BYPASS GRAFT  01/12/2011   LUMBAR LAMINECTOMY/DECOMPRESSION MICRODISCECTOMY N/A 05/08/2015   Procedure: LUMBAR LAMINECTOMY/DECOMPRESSION MICRODISCECTOMY 1 LEVEL Lumbar four five;  Surgeon: Melina Schools, MD;  Location: [place];  Service: Orthopedics;  Laterality: N/A;   TOTAL HIP ARTHROPLASTY Left    left on 04/15/07   TYMPANIC MEMBRANE REPAIR  1981    FAMILY HISTORY Family History  Problem Relation Age of Onset   Heart failure  Mother    Heart disease Father    Cancer Sister    Cancer Brother    Heart disease Brother    Breast cancer Sister    Colon cancer Neg Hx    Esophageal cancer Neg Hx    Rectal cancer Neg Hx    Stomach cancer Neg Hx     SOCIAL HISTORY Social History   Tobacco Use   Smoking status: Former    Packs/day: 2.00    Years: 30.00    Pack years: 60.00    Types:  Cigarettes    Quit date: 01/02/1981    Years since quitting: 40.3   Smokeless tobacco: Never  Vaping Use   Vaping Use: Never used  Substance Use Topics   Alcohol use: No   Drug use: No         OPHTHALMIC EXAM:  Base Eye Exam     Visual Acuity (ETDRS)       Right Left   Dist cc 20/200 -1 20/20 -2   Dist ph cc NI     Correction: Glasses         Tonometry (Tonopen, 3:29 PM)       Right Left   Pressure 17 17         Pupils       Pupils Dark Light Shape React APD   Right PERRL 3 3 Round Brisk None   Left PERRL 3 2 Round Brisk None         Visual Fields (Counting fingers)       Left Right     Full   Restrictions Central scotoma          Extraocular Movement       Right Left    Full Full         Neuro/Psych     Oriented x3: Yes         Dilation     Both eyes: 1.0% Mydriacyl, 2.5% Phenylephrine @ 3:29 PM           Slit Lamp and Fundus Exam     External Exam       Right Left   External Normal Normal         Slit Lamp Exam       Right Left   Lids/Lashes Normal Normal   Conjunctiva/Sclera White and quiet White and quiet   Cornea Clear Clear   Anterior Chamber Deep and quiet Deep and quiet   Iris Round and reactive Round and reactive   Lens Clear Posterior chamber intraocular lens   Anterior Vitreous Normal Normal         Fundus Exam       Right Left   Posterior Vitreous Normal Normal   Disc Normal Normal   C/D Ratio 0.5 0.5   Macula Subretinal hemorrhage, Hemorrhage, with sub-RPE hemorrhage, approximately 3 disc areas in size with adjacent subretinal  hemorrhage and other 3 disc areas in size Intermediate age related macular degeneration, Soft drusen, no macular thickening, no hemorrhage   Vessels Normal Normal   Periphery Normal Normal            IMAGING AND PROCEDURES  Imaging and Procedures for 05/12/21  OCT, Retina - OU - Both Eyes       Right Eye Quality was good. Scan locations included subfoveal. Central Foveal Thickness: 479. Progression has no prior data. Findings include abnormal foveal contour, intraretinal fluid, subretinal fluid, pigment epithelial detachment.   Left Eye Quality was good. Scan locations included subfoveal. Central Foveal Thickness: 308. Progression has no prior data. Findings include abnormal foveal contour, retinal drusen .   Notes Significant hemorrhagic PED OU with subretinal hemorrhage in the.  Subfoveal location.  Wet AMD OD.  We will commence with therapy with     Intravitreal Injection, Pharmacologic Agent - OD - Right Eye       Time Out 05/12/2021. 4:14 PM. Confirmed correct patient, procedure, site, and patient consented.   Anesthesia Topical anesthesia was used. Anesthetic medications included [place] 3.5%.  Procedure Preparation included 5% betadine to ocular surface, 10% betadine to eyelids, Ofloxacin .   Injection: 2.5 mg bevacizumab 2.5 MG/0.1ML   Route: Intravitreal, Site: Right Eye   NDC: [phone], Lot: [id number]   Post-op Post injection exam found visual acuity of at least counting fingers. The patient tolerated the procedure well. There were no complications. The patient received written and verbal post procedure care education. Post injection medications were not given.              ASSESSMENT/PLAN:  Exudative age-related macular degeneration of right eye with active choroidal neovascularization (HCC) OD, new onset CNVM with significant hemorrhagic RPE detachment and subretinal hemorrhage subfoveal will need to commence therapy with antivegF  Commence with  Avastin today  Diabetes mellitus, type 2 (HCC) No detectable diabetic retinopathy OU today  Intermediate stage nonexudative age-related macular degeneration of both eyes The nature of age--related macular degeneration was discussed with the patient as well as the distinction between dry and wet types. Checking an [place]ler Grid daily with advice to return immediately should a distortion develop, was given to the patient. The patient 's smoking status now and in the past was determined and advice based on the AREDS study was provided regarding the consumption of antioxidant supplements. AREDS 2 vitamin formulation was recommended. Consumption of dark leafy vegetables and fresh fruits of various colors was recommended. Treatment modalities for wet macular degeneration particularly the use of intravitreal injections of anti-blood vessel growth factors was discussed with the patient. Avastin, Lucentis, and Eylea are the available options. On occasion, therapy includes the use of photodynamic therapy and thermal laser. Stressed to the patient do not rub eyes.  Patient was advised to check Amsler Grid daily and return immediately if changes are noted. Instructions on using the grid were given to the patient. All patient questions were answered.  Left epiretinal membrane OS, Will observe today     ICD-10-CM   1. Exudative age-related macular degeneration of right eye with active choroidal neovascularization (HCC)  H35.3211 OCT, Retina - OU - Both Eyes    Intravitreal Injection, Pharmacologic Agent - OD - Right Eye    bevacizumab (AVASTIN) SOSY 2.5 mg    2. Type 2 diabetes mellitus with other circulatory complication, without long-term current use of insulin (HCC)  E11.59     3. Intermediate stage nonexudative age-related macular degeneration of both eyes  H35.3132     4. Left epiretinal membrane  H35.372     5. Deep retinal hemorrhage, right  H35.61 OCT, Retina - OU - Both Eyes      1.  New onset  wet AMD with several RPE hemorrhagic changes well subretinal hemorrhage large approximately 8-9 disc areas total in size OD  We will commence with therapy today intravitreal antivegF, Avastin  2.  Dilate OD next in 5 weeks likely repeat injection  3.  Ophthalmic Meds Ordered this visit:  Meds ordered this encounter  Medications   bevacizumab (AVASTIN) SOSY 2.5 mg       Return in about 5 weeks (around 06/16/2021) for dilate, OD, AVASTIN OCT.  There are no Patient Instructions on file for this visit.   Explained the diagnoses, plan, and follow up with the patient and they expressed understanding.  Patient expressed understanding of the importance of proper follow up care.   Clent Demark Jaspal Pultz M.D. Diseases & Surgery of the Retina and Vitreous [facility] [place] 05/12/21     Abbreviations: M myopia (nearsighted); A astigmatism; H  hyperopia (farsighted); P presbyopia; Mrx spectacle prescription;  CTL contact lenses; OD right eye; OS left eye; OU both eyes  XT exotropia; ET esotropia; PEK punctate epithelial keratitis; PEE punctate epithelial erosions; DES dry eye syndrome; MGD meibomian gland dysfunction; ATs artificial tears; PFAT's preservative free artificial tears; [place] nuclear sclerotic cataract; PSC posterior subcapsular cataract; ERM epi-retinal membrane; PVD posterior vitreous detachment; RD retinal detachment; DM diabetes mellitus; DR diabetic retinopathy; NPDR non-proliferative diabetic retinopathy; PDR proliferative diabetic retinopathy; CSME clinically significant macular edema; DME diabetic macular edema; dbh dot blot hemorrhages; CWS cotton wool spot; POAG primary open angle glaucoma; C/D cup-to-disc ratio; HVF humphrey visual field; GVF goldmann visual field; OCT optical coherence tomography; IOP intraocular pressure; BRVO Branch retinal vein occlusion; CRVO central retinal vein occlusion; CRAO central retinal artery occlusion; BRAO branch retinal artery occlusion; RT  retinal tear; SB scleral buckle; PPV pars plana vitrectomy; VH Vitreous hemorrhage; PRP panretinal laser photocoagulation; IVK intravitreal kenalog; VMT vitreomacular traction; MH Macular hole;  NVD neovascularization of the disc; NVE neovascularization elsewhere; AREDS age related eye disease study; ARMD age related macular degeneration; POAG primary open angle glaucoma; EBMD epithelial/anterior basement membrane dystrophy; ACIOL anterior chamber intraocular lens; IOL intraocular lens; PCIOL posterior chamber intraocular lens; Phaco/IOL phacoemulsification with intraocular lens placement; [place] photorefractive keratectomy; LASIK laser assisted in situ keratomileusis; HTN hypertension; DM diabetes mellitus; COPD chronic obstructive pulmonary disease

## 2021-05-12 NOTE — Assessment & Plan Note (Signed)
OS, Will observe today

## 2021-05-12 NOTE — Assessment & Plan Note (Signed)
No detectable diabetic retinopathy OU today 

## 2021-05-12 NOTE — Assessment & Plan Note (Signed)
OD, new onset CNVM with significant hemorrhagic RPE detachment and subretinal hemorrhage subfoveal will need to commence therapy with antivegF  Commence with Avastin today

## 2021-05-12 NOTE — Assessment & Plan Note (Signed)

## 2021-05-27 DIAGNOSIS — M5416 Radiculopathy, lumbar region: Secondary | ICD-10-CM | POA: Diagnosis not present

## 2021-05-29 ENCOUNTER — Other Ambulatory Visit: Payer: Self-pay

## 2021-05-29 ENCOUNTER — Ambulatory Visit (INDEPENDENT_AMBULATORY_CARE_PROVIDER_SITE_OTHER): Payer: Medicare Other | Admitting: Internal Medicine

## 2021-05-29 ENCOUNTER — Encounter: Payer: Self-pay | Admitting: Internal Medicine

## 2021-05-29 ENCOUNTER — Ambulatory Visit (INDEPENDENT_AMBULATORY_CARE_PROVIDER_SITE_OTHER): Payer: Medicare Other

## 2021-05-29 VITALS — BP 120/70 | HR 79 | Temp 98.2°F | Ht 68.0 in | Wt 227.2 lb

## 2021-05-29 DIAGNOSIS — D509 Iron deficiency anemia, unspecified: Secondary | ICD-10-CM

## 2021-05-29 DIAGNOSIS — G8929 Other chronic pain: Secondary | ICD-10-CM

## 2021-05-29 DIAGNOSIS — K5901 Slow transit constipation: Secondary | ICD-10-CM

## 2021-05-29 DIAGNOSIS — Z0000 Encounter for general adult medical examination without abnormal findings: Secondary | ICD-10-CM | POA: Diagnosis not present

## 2021-05-29 DIAGNOSIS — M545 Low back pain, unspecified: Secondary | ICD-10-CM | POA: Diagnosis not present

## 2021-05-29 DIAGNOSIS — E1159 Type 2 diabetes mellitus with other circulatory complications: Secondary | ICD-10-CM | POA: Diagnosis not present

## 2021-05-29 DIAGNOSIS — D649 Anemia, unspecified: Secondary | ICD-10-CM | POA: Insufficient documentation

## 2021-05-29 NOTE — Assessment & Plan Note (Signed)
Check A1c. 

## 2021-05-29 NOTE — Progress Notes (Signed)
Subjective:  Patient ID: [name], male    DOB: 1935-04-25  Age: 86 y.o. MRN: [id number]  CC: Follow-up (3 month f/u)   HPI PRINCESTON BLIZZARD presents for HTN, CAD, DM  Outpatient Medications Prior to Visit  Medication Sig Dispense Refill   Accu-Chek Softclix Lancets lancets Use to check blood sugar daily DX:E11.59 100 each 3   albuterol (VENTOLIN HFA) 108 (90 Base) MCG/ACT inhaler TAKE 2 PUFFS BY MOUTH EVERY 6 HOURS AS NEEDED FOR WHEEZE OR SHORTNESS OF BREATH 6.7 each 2   amiodarone (PACERONE) 200 MG tablet Take 1 tablet (200 mg total) by mouth daily. Except Sunday. 90 tablet 3   bisacodyl (DULCOLAX) 5 MG EC tablet Take 5 mg by mouth daily as needed for moderate constipation.     Blood Glucose Monitoring Suppl (ACCU-CHEK AVIVA PLUS) w/Device KIT Use to check blood sugar daily DX:E11.59 1 kit 0   cholecalciferol (VITAMIN D) 1000 units tablet Take 1,000 Units by mouth 2 (two) times daily.     docusate sodium (COLACE) 100 MG capsule Take 300 mg by mouth at bedtime.     ELIQUIS 5 MG TABS tablet TAKE 1 TABLET BY MOUTH TWICE A DAY 60 tablet 5   ferrous sulfate 325 (65 FE) MG tablet Take 1 tablet (325 mg total) by mouth 2 (two) times daily with a meal. 60 tablet 6   furosemide (LASIX) 40 MG tablet Take 1 tablet daily alternating with 1.5 tablets every other day 135 tablet 3   gabapentin (NEURONTIN) 100 MG capsule Take 1 capsule (100 mg total) by mouth 3 (three) times daily. 270 capsule 3   glucose blood (ACCU-CHEK AVIVA PLUS) test strip Use to check blood sugar daily DX:E11.59 100 each 3   ibuprofen (ADVIL) 600 MG tablet TAKE ONE TABLET BY MOUTH  PRN     latanoprost (XALATAN) 0.005 % ophthalmic solution Place 1 drop into both eyes daily.     metFORMIN (GLUCOPHAGE) 500 MG tablet TAKE 1 TABLET BY MOUTH THREE TIMES A DAY 270 tablet 3   metoprolol tartrate (LOPRESSOR) 50 MG tablet      pantoprazole (PROTONIX) 40 MG tablet TAKE 1 TABLET BY MOUTH EVERY DAY 90 tablet 3   Propylene  Glycol 0.6 % SOLN Place 1 drop into both eyes 2 (two) times daily as needed (for dry eyes).     repaglinide (PRANDIN) 2 MG tablet TAKE 1 TABLET BY MOUTH 3 TIMES DAILY BEFORE MEALS. (Patient taking differently: Take 2 mg by mouth 3 (three) times daily before meals.) 270 tablet 3   simvastatin (ZOCOR) 10 MG tablet TAKE 1 TABLET BY MOUTH EVERY DAY 90 tablet 3   Tiotropium Bromide-Olodaterol (STIOLTO RESPIMAT) 2.5-2.5 MCG/ACT AERS Inhale 2 puffs into the lungs daily. 4 g 0   vitamin B-12 (CYANOCOBALAMIN) 250 MCG tablet Take 2 tablets (500 mcg total) by mouth daily.     No facility-administered medications prior to visit.    ROS: Review of Systems  Constitutional:  Positive for fatigue. Negative for appetite change and unexpected weight change.  HENT:  Negative for congestion, nosebleeds, sneezing, sore throat and trouble swallowing.   Eyes:  Negative for itching and visual disturbance.  Respiratory:  Positive for shortness of breath. Negative for cough and wheezing.   Cardiovascular:  Negative for chest pain, palpitations and leg swelling.  Gastrointestinal:  Positive for constipation. Negative for abdominal distention, blood in stool, diarrhea and nausea.  Genitourinary:  Negative for frequency and hematuria.  Musculoskeletal:  Positive for arthralgias,  back pain and gait problem. Negative for joint swelling and neck pain.  Skin:  Negative for rash.  Neurological:  Negative for dizziness, tremors, speech difficulty and weakness.  Psychiatric/Behavioral:  Negative for agitation, dysphoric mood, sleep disturbance and suicidal ideas. The patient is not nervous/anxious.    Objective:  BP 120/70 (BP Location: Left Arm)   Pulse 79   Temp 98.2 F (36.8 C) (Oral)   Ht '5\' 8"'  (1.727 m)   Wt 227 lb 3.2 oz (103.1 kg)   SpO2 94%   BMI 34.55 kg/m   BP Readings from Last 3 Encounters:  05/29/21 120/70  05/29/21 120/70  02/27/21 128/62    Wt Readings from Last 3 Encounters:  05/29/21 227 lb 3.2  oz (103.1 kg)  05/29/21 227 lb 3.2 oz (103.1 kg)  04/03/21 228 lb (103.4 kg)    Physical Exam Constitutional:      General: He is not in acute distress.    Appearance: He is well-developed. He is obese.     Comments: NAD  Eyes:     Conjunctiva/sclera: Conjunctivae normal.     Pupils: Pupils are equal, round, and reactive to light.  Neck:     Thyroid: No thyromegaly.     Vascular: No JVD.  Cardiovascular:     Rate and Rhythm: Normal rate and regular rhythm.     Heart sounds: Normal heart sounds. No murmur heard.   No friction rub. No gallop.  Pulmonary:     Effort: Pulmonary effort is normal. No respiratory distress.     Breath sounds: Normal breath sounds. No wheezing or rales.  Chest:     Chest wall: No tenderness.  Abdominal:     General: Bowel sounds are normal. There is no distension.     Palpations: Abdomen is soft. There is no mass.     Tenderness: There is no abdominal tenderness. There is no guarding or rebound.  Musculoskeletal:        General: Tenderness present. Normal range of motion.     Cervical back: Normal range of motion.  Lymphadenopathy:     Cervical: No cervical adenopathy.  Skin:    General: Skin is warm and dry.     Findings: No rash.  Neurological:     Mental Status: He is alert and oriented to person, place, and time.     Cranial Nerves: No cranial nerve deficit.     Motor: No abnormal muscle tone.     Coordination: Coordination normal.     Gait: Gait normal.     Deep Tendon Reflexes: Reflexes are normal and symmetric.  Psychiatric:        Behavior: Behavior normal.        Thought Content: Thought content normal.        Judgment: Judgment normal.   Stiff LS   Lab Results  Component Value Date   WBC 9.7 04/23/2021   HGB 11.5 (L) 04/23/2021   HCT 37.9 04/23/2021   PLT 294 04/23/2021   GLUCOSE 145 (H) 04/07/2021   CHOL 126 08/09/2020   TRIG 149 08/09/2020   HDL 30 (L) 08/09/2020   LDLCALC 70 08/09/2020   ALT 14 02/27/2021   AST 18  02/27/2021   NA 137 04/07/2021   K 3.9 04/07/2021   CL 101 04/07/2021   CREATININE 1.37 (H) 04/07/2021   BUN 22 04/07/2021   CO2 22 04/07/2021   TSH 3.740 08/09/2020   PSA 1.18 07/02/2017   INR 2.3 03/03/2012   HGBA1C  7.5 (H) 02/27/2021   MICROALBUR 0.7 04/04/2018    CT ENTERO ABD/PELVIS W CONTAST  Result Date: 08/20/2020 CLINICAL DATA:  Follow-up small bowel wall thickening EXAM: CT ABDOMEN AND PELVIS WITH CONTRAST (ENTEROGRAPHY) TECHNIQUE: Multidetector CT of the abdomen and pelvis during bolus administration of intravenous contrast. Negative oral contrast was given. CONTRAST:  152m OMNIPAQUE IOHEXOL 300 MG/ML  SOLN COMPARISON:  CT abdomen/pelvis dated 08/05/2020. CT abdomen/pelvis dated 08/04/2020. FINDINGS: Lower chest: Mild subpleural reticulation/fibrosis at the lung bases. Hepatobiliary: Liver is within normal limits. Gallbladder is unremarkable. No intrahepatic or extrahepatic ductal dilatation. Pancreas: Within normal limits. Spleen: Within normal limits. Adrenals/Urinary Tract: Adrenal glands are within normal limits. 8 mm lateral left upper pole renal cyst (series 8/image 16). Right kidney is within normal limits. No hydronephrosis. Bladder is within normal limits, although partially obscured by streak artifact. Stomach/Bowel: Stomach is within normal limits. No evidence of bowel obstruction. No small bowel wall thickening or mass is evident on CT. Specifically, the prior segment of abnormal small bowel beneath the right mid anterior abdominal wall is no longer evident. Normal appendix (series 2/image 81). No colonic wall thickening or inflammatory changes. Vascular/Lymphatic: No evidence of abdominal aortic aneurysm. Atherosclerotic calcifications of the abdominal aorta and branch vessels. No suspicious abdominopelvic lymphadenopathy. Reproductive: Prostate is grossly unremarkable, although partially obscured by streak artifact. Other: No abdominopelvic ascites. Musculoskeletal: Left hip  arthroplasty, without evidence of complication. Mild degenerative changes of the visualized thoracolumbar spine. Median sternotomy. IMPRESSION: No small bowel wall thickening or mass is evident on CT. Specifically, the prior segment of abnormal small bowel beneath the right mid anterior abdominal wall is no longer evident. Presumably, this reflected infectious/inflammatory small bowel enteritis, which has now resolved. Additional stable ancillary findings as above. Electronically Signed   By: SJulian HyM.D.   On: 08/20/2020 16:50    Assessment & Plan:    AWalker Kehr MD

## 2021-05-29 NOTE — Assessment & Plan Note (Signed)
Better on iron

## 2021-05-29 NOTE — Assessment & Plan Note (Signed)
Worse on iron - use LOC

## 2021-05-29 NOTE — Progress Notes (Signed)
Subjective:   [name] is a 86 y.o. male who presents for Medicare Annual/Subsequent preventive examination.  Review of Systems     Cardiac Risk Factors include: advanced age (>73mn, >>20women);diabetes mellitus;dyslipidemia;family history of premature cardiovascular disease;hypertension;male gender;obesity (BMI >30kg/m2);sedentary lifestyle     Objective:    Today's Vitals   05/29/21 1319  BP: 120/70  Pulse: 79  Temp: 98.2 F (36.8 C)  SpO2: 94%  Weight: 227 lb 3.2 oz (103.1 kg)  Height: _0  (1.727 m)   Body mass index is 34.55 kg/m.  Advanced Directives 05/29/2021 08/04/2020 10/25/2019 10/24/2019 01/28/2019 07/25/2018 06/08/2018  Does Patient Have a Medical Advance Directive? No No - No No No No  Would patient like information on creating a medical advance directive? No - Patient declined No - Patient declined No - Patient declined - - No - Patient declined No - Patient declined    Current Medications (verified) Outpatient Encounter Medications as of 05/29/2021  Medication Sig   Accu-Chek Softclix Lancets lancets Use to check blood sugar daily DX:E11.59   albuterol (VENTOLIN HFA) 108 (90 Base) MCG/ACT inhaler TAKE 2 PUFFS BY MOUTH EVERY 6 HOURS AS NEEDED FOR WHEEZE OR SHORTNESS OF BREATH   amiodarone (PACERONE) 200 MG tablet Take 1 tablet (200 mg total) by mouth daily. Except Sunday.   bisacodyl (DULCOLAX) 5 MG EC tablet Take 5 mg by mouth daily as needed for moderate constipation.   Blood Glucose Monitoring Suppl (ACCU-CHEK AVIVA PLUS) w/Device KIT Use to check blood sugar daily DX:E11.59   cholecalciferol (VITAMIN D) 1000 units tablet Take 1,000 Units by mouth 2 (two) times daily.   docusate sodium (COLACE) 100 MG capsule Take 300 mg by mouth at bedtime.   ELIQUIS 5 MG TABS tablet TAKE 1 TABLET BY MOUTH TWICE A DAY   ferrous sulfate 325 (65 FE) MG tablet Take 1 tablet (325 mg total) by mouth 2 (two) times daily with a meal.   furosemide (LASIX) 40 MG tablet Take 1  tablet daily alternating with 1.5 tablets every other day   gabapentin (NEURONTIN) 100 MG capsule Take 1 capsule (100 mg total) by mouth 3 (three) times daily.   glucose blood (ACCU-CHEK AVIVA PLUS) test strip Use to check blood sugar daily DX:E11.59   ibuprofen (ADVIL) 600 MG tablet TAKE ONE TABLET BY MOUTH  PRN   latanoprost (XALATAN) 0.005 % ophthalmic solution Place 1 drop into both eyes daily.   metFORMIN (GLUCOPHAGE) 500 MG tablet TAKE 1 TABLET BY MOUTH THREE TIMES A DAY   pantoprazole (PROTONIX) 40 MG tablet TAKE 1 TABLET BY MOUTH EVERY DAY   Propylene Glycol 0.6 % SOLN Place 1 drop into both eyes 2 (two) times daily as needed (for dry eyes).   repaglinide (PRANDIN) 2 MG tablet TAKE 1 TABLET BY MOUTH 3 TIMES DAILY BEFORE MEALS. (Patient taking differently: Take 2 mg by mouth 3 (three) times daily before meals.)   simvastatin (ZOCOR) 10 MG tablet TAKE 1 TABLET BY MOUTH EVERY DAY   vitamin B-12 (CYANOCOBALAMIN) 250 MCG tablet Take 2 tablets (500 mcg total) by mouth daily.   metoprolol tartrate (LOPRESSOR) 50 MG tablet    Tiotropium Bromide-Olodaterol (STIOLTO RESPIMAT) 2.5-2.5 MCG/ACT AERS Inhale 2 puffs into the lungs daily.   No facility-administered encounter medications on file as of 05/29/2021.    Allergies (verified) Patient has no known allergies.   History: Past Medical History:  Diagnosis Date   [place]ute diastolic CHF (congestive heart failure[place] 08/04/[place]   Arthritis  Atrial fibrillation ([place])    post op; amiodarone and coumadin continued for 3 mos post op   CARCINOMA, SKIN, SQUAMOUS CELL 01/07/2010   Cataract    beginning of cataracts   COLONIC POLYPS, HX OF 04/26/2007   Coronary artery disease    s/p CABG 3/12: L-LAD, S-CFX (Dr. Roxy Manns); EF 45% at cath prior to CABG   DIABETES MELLITUS, TYPE II 04/26/2007   Hyperlipidemia    HYPERTENSION 04/26/2007   NEPHROLITHIASIS, HX OF 04/26/2007   SBO (small bowel obstruction) ([place]) 10/2015   Partial    Shortness of breath  dyspnea    with exertion   Unspecified hearing loss 12/18/2008   Past Surgical History:  Procedure Laterality Date   BACK SURGERY     disectomy   CARDIOVERSION N/A 06/08/2018   Procedure: CARDIOVERSION;  Surgeon: Buford Dresser, MD;  Location: [facility] ENDOSCOPY;  Service: Cardiovascular;  Laterality: N/A;   CARDIOVERSION N/A 07/25/2018   Procedure: CARDIOVERSION;  Surgeon: Fay Records, MD;  Location: [place];  Service: Cardiovascular;  Laterality: N/A;   COLONOSCOPY     CORONARY ARTERY BYPASS GRAFT  01/12/2011   LUMBAR LAMINECTOMY/DECOMPRESSION MICRODISCECTOMY N/A 05/08/2015   Procedure: LUMBAR LAMINECTOMY/DECOMPRESSION MICRODISCECTOMY 1 LEVEL Lumbar four five;  Surgeon: Melina Schools, MD;  Location: [place];  Service: Orthopedics;  Laterality: N/A;   TOTAL HIP ARTHROPLASTY Left    left on 04/15/07   TYMPANIC MEMBRANE REPAIR  1981   Family History  Problem Relation Age of Onset   Heart failure Mother    Heart disease Father    Cancer Sister    Cancer Brother    Heart disease Brother    Breast cancer Sister    Colon cancer Neg Hx    Esophageal cancer Neg Hx    Rectal cancer Neg Hx    Stomach cancer Neg Hx    Social History   Socioeconomic History   Marital status: Married    Spouse name: Not on file   Number of children: Not on file   Years of education: Not on file   Highest education level: Not on file  Occupational History   Not on file  Tobacco Use   Smoking status: Former    Packs/day: 2.00    Years: 30.00    Pack years: 60.00    Types: Cigarettes    Quit date: 01/02/1981    Years since quitting: 40.4   Smokeless tobacco: Never  Vaping Use   Vaping Use: Never used  Substance and Sexual Activity   Alcohol use: No   Drug use: No   Sexual activity: Not on file  Other Topics Concern   Not on file  Social History Narrative   Not on file   Social Determinants of Health   Financial Resource Strain: Low Risk    Difficulty of Paying Living Expenses: Not  hard at all  Food Insecurity: No Food Insecurity   Worried About Charity fundraiser in the Last Year: Never true   Ran Out of Food in the Last Year: Never true  Transportation Needs: No Transportation Needs   Lack of Transportation (Medical): No   Lack of Transportation (Non-Medical): No  Physical Activity: Inactive   Days of Exercise per Week: 0 days   Minutes of Exercise per Session: 0 min  Stress: No Stress Concern Present   Feeling of Stress : Not at all  Social Connections: Socially Integrated   Frequency of Communication with Friends and Family: More than three times a  week   Frequency of Social Gatherings with Friends and Family: Once a week   Attends Religious Services: More than 4 times per year   Active Member of [employer] or Organizations: No   Attends Music therapist: More than 4 times per year   Marital Status: Married    Tobacco Counseling Counseling given: Not Answered   Clinical Intake:  Pre-visit preparation completed: Yes  Pain : No/denies pain     BMI - recorded: 34.55 Nutritional Status: BMI > 30  Obese Nutritional Risks: None Diabetes: Yes CBG done?: No Did pt. bring in CBG monitor from home?: No  How often do you need to have someone help you when you read instructions, pamphlets, or other written materials from your doctor or pharmacy?: 1 - Never What is the last grade level you completed in school?: 8th grade  Diabetic? yes  Interpreter Needed?: No  Information entered by :: Lisette Abu, LPN   Activities of Daily Living In your present state of health, do you have any difficulty performing the following activities: 05/29/2021 09/10/2020  Hearing? N N  Vision? N N  Difficulty concentrating or making decisions? N N  Walking or climbing stairs? N N  Dressing or bathing? N N  Doing errands, shopping? N N  Preparing Food and eating ? N -  Using the Toilet? N -  In the past six months, have you accidently leaked urine? N -   Do you have problems with loss of bowel control? N -  Managing your Medications? N -  Managing your Finances? N -  Housekeeping or managing your Housekeeping? N -  Some recent data might be hidden    Patient Care Team: [name], Evie Lacks, MD as PCP - General (Internal Medicine) Fay Records, MD as PCP - Cardiology (Cardiology) Melina Schools, MD as Consulting Physician (Orthopedic Surgery) Suella Broad, MD as Consulting Physician (Physical Medicine and Rehabilitation) Hortencia Pilar, MD as Consulting Physician (Ophthalmology) Zadie Rhine Clent Demark, MD as Consulting Physician (Ophthalmology)  Indicate any recent Medical Services you may have received from other than [facility] providers in the past year (date may be approximate).     Assessment:   This is a routine wellness examination for [place].  Hearing/Vision screen Hearing Screening - Comments:: Patient declined any hearing difficulty. Vision Screening - Comments:: Patient glasses.  Seen for macular degeneration.  Dietary issues and exercise activities discussed: Current Exercise Habits: The patient does not participate in regular exercise at present   Goals Addressed               This Visit's Progress     Patient Stated (pt-stated)        My goal is to stay alive and get better vision, because right now I am dealing with macular degeneration in my left eye.      Depression Screen PHQ 2/9 Scores 05/29/2021 09/10/2020 07/02/2017 05/26/2016 05/21/2015 01/16/2015 01/03/2013  PHQ - 2 Score 0 0 2 0 0 0 0  PHQ- 9 Score - - 5 - - - -    Fall Risk Fall Risk  05/29/2021 09/10/2020 07/05/2018 07/02/2017 05/26/2016  Falls in the past year? 1 0 No No No  Number falls in past yr: 0 0 - - -  Injury with Fall? 1 0 - - -  Comment hurt tailbone - - - -  Risk for fall due to : - No Fall Risks - Impaired mobility -  Follow up - Falls evaluation completed - - -  FALL RISK PREVENTION PERTAINING TO THE HOME:  Any stairs in or around  the home? No  If so, are there any without handrails? No  Home free of loose throw rugs in walkways, pet beds, electrical cords, etc? Yes  Adequate lighting in your home to reduce risk of falls? Yes   ASSISTIVE DEVICES UTILIZED TO PREVENT FALLS:  Life alert? No  Use of a cane, walker or w/c? No  Grab bars in the bathroom? Yes  Shower chair or bench in shower? Yes  Elevated toilet seat or a handicapped toilet? Yes   TIMED UP AND GO:  Was the test performed? Yes .  Length of time to ambulate 10 feet: 8 sec.   Gait steady and fast without use of assistive device  Cognitive Function:Normal cognitive status assessed by direct observation by this Nurse Health Advisor. No abnormalities found.   MMSE - Mini Mental State Exam 07/02/2017  Orientation to time 5  Orientation to Place 5  Registration 3  Attention/ Calculation 4  Recall 2  Language- name 2 objects 2  Language- repeat 1  Language- follow 3 step command 3  Language- read & follow direction 1  Write a sentence 1  Copy design 1  Total score 28        Immunizations Immunization History  Administered Date(s) Administered   Fluad Quad(high Dose 65+) 08/18/2019, 11/29/2020   Influenza Nasal 08/28/2016   Influenza Whole 09/14/2007, 09/01/2010   Influenza, High Dose Seasonal PF 09/13/2009, 08/01/2012, 08/29/2014, 09/06/2017, 08/16/2018   Influenza,inj,Quad PF,6+ Mos 08/20/2015   Influenza-Unspecified 08/16/2008, 09/16/2008, 08/28/2010, 08/17/2011, 08/25/2013, 08/16/2014   PFIZER(Purple Top)SARS-COV-2 Vaccination 02/23/2020, 03/19/2020   Pneumococcal Conjugate-13 08/16/2014, 08/29/2014   Pneumococcal Polysaccharide-23 10/16/2002, 02/18/2016   Pneumococcal-Unspecified 08/16/2005, 08/28/2010   Td 11/16/1993, 12/18/2008   Tdap 11/16/2009   Zoster, Live 07/24/2011    TDAP status: Due, Education has been provided regarding the importance of this vaccine. Advised may receive this vaccine at local pharmacy or Health Dept.  Aware to provide a copy of the vaccination record if obtained from local pharmacy or Health Dept. Verbalized acceptance and understanding.  Flu Vaccine status: Up to date  Pneumococcal vaccine status: Up to date  Covid-19 vaccine status: Completed vaccines  Qualifies for Shingles Vaccine? Yes   Zostavax completed Yes   Shingrix Completed?: No.    Education has been provided regarding the importance of this vaccine. Patient has been advised to call insurance company to determine out of pocket expense if they have not yet received this vaccine. Advised may also receive vaccine at local pharmacy or Health Dept. Verbalized acceptance and understanding.  Screening Tests Health Maintenance  Topic Date Due   Zoster Vaccines- Shingrix (1 of 2) Never done   URINE MICROALBUMIN  04/05/2019   FOOT EXAM  07/06/2019   TETANUS/TDAP  11/17/2019   COVID-19 Vaccine (3 - Pfizer risk series) 04/16/2020   INFLUENZA VACCINE  06/16/2021   OPHTHALMOLOGY EXAM  04/30/2022   PNA vac Low Risk Adult  Completed   HPV VACCINES  Aged Out    Health Maintenance  Health Maintenance Due  Topic Date Due   Zoster Vaccines- Shingrix (1 of 2) Never done   URINE MICROALBUMIN  04/05/2019   FOOT EXAM  07/06/2019   TETANUS/TDAP  11/17/2019   COVID-19 Vaccine (3 - Pfizer risk series) 04/16/2020    Colorectal cancer screening: No longer required.   Lung Cancer Screening: (Low Dose CT Chest recommended if Age 49-80 years, 30 pack-year currently smoking OR  have quit w/in 15years.) does not qualify.   Lung Cancer Screening Referral: no  Additional Screening:  Hepatitis C Screening: does not qualify; Completed no  Vision Screening: Recommended annual ophthalmology exams for early detection of glaucoma and other disorders of the eye. Is the patient up to date with their annual eye exam?  Yes  Who is the provider or what is the name of the office in which the patient attends annual eye exams? Dr. Marshall Cork If  pt is not established with a provider, would they like to be referred to a provider to establish care? No .   Dental Screening: Recommended annual dental exams for proper oral hygiene  Community Resource Referral / Chronic Care Management: CRR required this visit?  No   CCM required this visit?  No      Plan:     I have personally reviewed and noted the following in the patient's chart:   Medical and social history Use of alcohol, tobacco or illicit drugs  Current medications and supplements including opioid prescriptions. Patient is not currently taking opioid prescriptions. Functional ability and status Nutritional status Physical activity Advanced directives List of other physicians Hospitalizations, surgeries, and ER visits in previous 12 months Vitals Screenings to include cognitive, depression, and falls Referrals and appointments  In addition, I have reviewed and discussed with patient certain preventive protocols, quality metrics, and best practice recommendations. A written personalized care plan for preventive services as well as general preventive health recommendations were provided to patient.     Sheral Flow, LPN   [id number]/[id number]   Nurse Notes: n/a

## 2021-05-29 NOTE — Patient Instructions (Signed)
[name] , Thank you for taking time to come for your Medicare Wellness Visit. I appreciate your ongoing commitment to your health goals. Please review the following plan we discussed and let me know if I can assist you in the future.   Screening recommendations/referrals: Colonoscopy: not a candidate for colon cancer screening due to age Recommended yearly ophthalmology/optometry visit for glaucoma screening and checkup Recommended yearly dental visit for hygiene and checkup  Vaccinations: Influenza vaccine: 11/29/2020 Pneumococcal vaccine: 08/29/2014, 02/18/2016 Tdap vaccine: 11/16/2009; due every 10 years, (OVERDUE): not covered by Medicare as preventative but will cover as treatment for an injury. Shingles vaccine: Please call your insurance company to determine your out of pocket expense for the Shingrix vaccine. You may receive this vaccine at your local pharmacy.   Covid-19: 02/23/2020, 03/19/2020  Advanced directives: Advance directive discussed with you today. Even though you declined this today please call our office should you change your mind and we can give you the proper paperwork for you to fill out.  Conditions/risks identified: Client understands the importance of follow-up with providers by attending scheduled visits and discussed goals to eat healthier, increase physical activity, exercise the brain, socialize more,  get enough rest and make time for laughter.  Next appointment: Please schedule your next Medicare Wellness Visit with your Nurse Health Advisor in 1 year by calling [phone].  Preventive Care 3 Years and Older, Male Preventive care refers to lifestyle choices and visits with your health care provider that can promote health and wellness. What does preventive care include? A yearly physical exam. This is also called an annual well check. Dental exams once or twice a year. Routine eye exams. Ask your health care provider how often you should have your eyes  checked. Personal lifestyle choices, including: Daily care of your teeth and gums. Regular physical activity. Eating a healthy diet. Avoiding tobacco and drug use. Limiting alcohol use. Practicing safe sex. Taking low doses of aspirin every day. Taking vitamin and mineral supplements as recommended by your health care provider. What happens during an annual well check? The services and screenings done by your health care provider during your annual well check will depend on your age, overall health, lifestyle risk factors, and family history of disease. Counseling  Your health care provider may ask you questions about your: Alcohol use. Tobacco use. Drug use. Emotional well-being. Home and relationship well-being. Sexual activity. Eating habits. History of falls. Memory and ability to understand (cognition). Work and work Statistician. Screening  You may have the following tests or measurements: Height, weight, and BMI. Blood pressure. Lipid and cholesterol levels. These may be checked every 5 years, or more frequently if you are over 66 years old. Skin check. Lung cancer screening. You may have this screening every year starting at age 48 if you have a 30-pack-year history of smoking and currently smoke or have quit within the past 15 years. Fecal occult blood test (FOBT) of the stool. You may have this test every year starting at age 6. Flexible sigmoidoscopy or colonoscopy. You may have a sigmoidoscopy every 5 years or a colonoscopy every 10 years starting at age 37. Prostate cancer screening. Recommendations will vary depending on your family history and other risks. Hepatitis C blood test. Hepatitis B blood test. Sexually transmitted disease (STD) testing. Diabetes screening. This is done by checking your blood sugar (glucose) after you have not eaten for a while (fasting). You may have this done every 1-3 years. Abdominal aortic aneurysm (AAA)  screening. You may need this  if you are a current or former smoker. Osteoporosis. You may be screened starting at age 43 if you are at high risk. Talk with your health care provider about your test results, treatment options, and if necessary, the need for more tests. Vaccines  Your health care provider may recommend certain vaccines, such as: Influenza vaccine. This is recommended every year. Tetanus, diphtheria, and acellular pertussis (Tdap, Td) vaccine. You may need a Td booster every 10 years. Zoster vaccine. You may need this after age 71. Pneumococcal 13-valent conjugate (PCV13) vaccine. One dose is recommended after age 30. Pneumococcal polysaccharide (PPSV23) vaccine. One dose is recommended after age 31. Talk to your health care provider about which screenings and vaccines you need and how often you need them. This information is not intended to replace advice given to you by your health care provider. Make sure you discuss any questions you have with your health care provider. Document Released: 11/29/2015 Document Revised: 07/22/2016 Document Reviewed: 09/03/2015 Elsevier Interactive Patient Education  2017 [place] Prevention in the Home Falls can cause injuries. They can happen to people of all ages. There are many things you can do to make your home safe and to help prevent falls. What can I do on the outside of my home? Regularly fix the edges of walkways and driveways and fix any cracks. Remove anything that might make you trip as you walk through a door, such as a raised step or threshold. Trim any bushes or trees on the path to your home. Use bright outdoor lighting. Clear any walking paths of anything that might make someone trip, such as rocks or tools. Regularly check to see if handrails are loose or broken. Make sure that both sides of any steps have handrails. Any raised decks and porches should have guardrails on the edges. Have any leaves, snow, or ice cleared regularly. Use sand  or salt on walking paths during winter. Clean up any spills in your garage right away. This includes oil or grease spills. What can I do in the bathroom? Use night lights. Install grab bars by the toilet and in the tub and shower. Do not use towel bars as grab bars. Use non-skid mats or decals in the tub or shower. If you need to sit down in the shower, use a plastic, non-slip stool. Keep the floor dry. Clean up any water that spills on the floor as soon as it happens. Remove soap buildup in the tub or shower regularly. Attach bath mats securely with double-sided non-slip rug tape. Do not have throw rugs and other things on the floor that can make you trip. What can I do in the bedroom? Use night lights. Make sure that you have a light by your bed that is easy to reach. Do not use any sheets or blankets that are too big for your bed. They should not hang down onto the floor. Have a firm chair that has side arms. You can use this for support while you get dressed. Do not have throw rugs and other things on the floor that can make you trip. What can I do in the kitchen? Clean up any spills right away. Avoid walking on wet floors. Keep items that you use a lot in easy-to-reach places. If you need to reach something above you, use a strong step stool that has a grab bar. Keep electrical cords out of the way. Do not use floor polish or  wax that makes floors slippery. If you must use wax, use non-skid floor wax. Do not have throw rugs and other things on the floor that can make you trip. What can I do with my stairs? Do not leave any items on the stairs. Make sure that there are handrails on both sides of the stairs and use them. Fix handrails that are broken or loose. Make sure that handrails are as long as the stairways. Check any carpeting to make sure that it is firmly attached to the stairs. Fix any carpet that is loose or worn. Avoid having throw rugs at the top or bottom of the stairs.  If you do have throw rugs, attach them to the floor with carpet tape. Make sure that you have a light switch at the top of the stairs and the bottom of the stairs. If you do not have them, ask someone to add them for you. What else can I do to help prevent falls? Wear shoes that: Do not have high heels. Have rubber bottoms. Are comfortable and fit you well. Are closed at the toe. Do not wear sandals. If you use a stepladder: Make sure that it is fully opened. Do not climb a closed stepladder. Make sure that both sides of the stepladder are locked into place. Ask someone to hold it for you, if possible. Clearly mark and make sure that you can see: Any grab bars or handrails. First and last steps. Where the edge of each step is. Use tools that help you move around (mobility aids) if they are needed. These include: Canes. Walkers. Scooters. Crutches. Turn on the lights when you go into a dark area. Replace any light bulbs as soon as they burn out. Set up your furniture so you have a clear path. Avoid moving your furniture around. If any of your floors are uneven, fix them. If there are any pets around you, be aware of where they are. Review your medicines with your doctor. Some medicines can make you feel dizzy. This can increase your chance of falling. Ask your doctor what other things that you can do to help prevent falls. This information is not intended to replace advice given to you by your health care provider. Make sure you discuss any questions you have with your health care provider. Document Released: 08/29/2009 Document Revised: 04/09/2016 Document Reviewed: 12/07/2014 Elsevier Interactive Patient Education  2017 [employer].

## 2021-05-29 NOTE — Assessment & Plan Note (Addendum)
Rare Norco prn F/u w/Dr Nelva Bush

## 2021-06-12 ENCOUNTER — Telehealth: Payer: Self-pay | Admitting: Internal Medicine

## 2021-06-12 NOTE — Chronic Care Management (AMB) (Signed)
  Chronic Care Management   Note  06/12/2021 Name: [name] MRN: [id number] DOB: Jan 14, 1935  [name] is a 86 y.o. year old male who is a primary care patient of [name], Evie Lacks, MD. I reached out to Jerre Simon by phone today in response to a referral sent by [name] PCP, [name], Evie Lacks, MD.   Mr. Axline was given information about Chronic Care Management services today including:  CCM service includes personalized support from designated clinical staff supervised by his physician, including individualized plan of care and coordination with other care providers 24/7 contact phone numbers for assistance for urgent and routine care needs. Service will only be billed when office clinical staff spend 20 minutes or more in a month to coordinate care. Only one practitioner may furnish and bill the service in a calendar month. The patient may stop CCM services at any time (effective at the end of the month) by phone call to the office staff.   Patient agreed to services and verbal consent obtained.   Follow up plan:   Lauretta Grill Upstream Scheduler

## 2021-06-18 ENCOUNTER — Encounter (INDEPENDENT_AMBULATORY_CARE_PROVIDER_SITE_OTHER): Payer: Self-pay | Admitting: Ophthalmology

## 2021-06-18 ENCOUNTER — Other Ambulatory Visit: Payer: Self-pay

## 2021-06-18 ENCOUNTER — Ambulatory Visit (INDEPENDENT_AMBULATORY_CARE_PROVIDER_SITE_OTHER): Payer: Medicare Other | Admitting: Ophthalmology

## 2021-06-18 DIAGNOSIS — H353211 Exudative age-related macular degeneration, right eye, with active choroidal neovascularization: Secondary | ICD-10-CM

## 2021-06-18 MED ORDER — BEVACIZUMAB 2.5 MG/0.1ML IZ SOSY
2.5000 mg | PREFILLED_SYRINGE | INTRAVITREAL | Status: AC | PRN
  Administered 2021-06-18: 2.5 mg via INTRAVITREAL

## 2021-06-18 NOTE — Progress Notes (Signed)
06/18/2021     CHIEF COMPLAINT Patient presents for Retina Follow Up (5 week fu OD and Avastin OD/Pt states VA OU stable since last visit. Pt denies FOL, floaters, or ocular pain OU. Karie [place]: unknown/LBS: 130 something (I do not check it all the time)/Pt reports that he uses Latanoprost In OD "but only about once a week."/)   HISTORY OF PRESENT ILLNESS: [name] is a 86 y.o. male who presents to the clinic today for:   HPI     Retina Follow Up           Diagnosis: Wet AMD   Laterality: right eye   Onset: 5 weeks ago   Severity: mild   Duration: 5 weeks   Course: stable   Comments: 5 week fu OD and Avastin OD Pt states VA OU stable since last visit. Pt denies FOL, floaters, or ocular pain OU.  A1C: unknown LBS: 130 something (I do not check it all the time) Pt reports that he uses Latanoprost In OD "but only about once a week."        Last edited by Kendra Opitz, COA on 06/18/2021  9:01 AM.      Referring physician: Cassandria Anger, MD [place],  [place] [id number]  HISTORICAL INFORMATION:   Selected notes from the MEDICAL RECORD NUMBER    Lab Results  Component Value Date   HGBA1C 7.5 (H) 02/27/2021     CURRENT MEDICATIONS: Current Outpatient Medications (Ophthalmic Drugs)  Medication Sig   latanoprost (XALATAN) 0.005 % ophthalmic solution Place 1 drop into both eyes daily.   Propylene Glycol 0.6 % SOLN Place 1 drop into both eyes 2 (two) times daily as needed (for dry eyes).   No current facility-administered medications for this visit. (Ophthalmic Drugs)   Current Outpatient Medications (Other)  Medication Sig   Accu-Chek Softclix Lancets lancets Use to check blood sugar daily DX:E11.59   albuterol (VENTOLIN HFA) 108 (90 Base) MCG/ACT inhaler TAKE 2 PUFFS BY MOUTH EVERY 6 HOURS AS NEEDED FOR WHEEZE OR SHORTNESS OF BREATH   amiodarone (PACERONE) 200 MG tablet Take 1 tablet (200 mg total) by mouth daily. Except Sunday.    bisacodyl (DULCOLAX) 5 MG EC tablet Take 5 mg by mouth daily as needed for moderate constipation.   Blood Glucose Monitoring Suppl (ACCU-CHEK AVIVA PLUS) w/Device KIT Use to check blood sugar daily DX:E11.59   cholecalciferol (VITAMIN D) 1000 units tablet Take 1,000 Units by mouth 2 (two) times daily.   docusate sodium (COLACE) 100 MG capsule Take 300 mg by mouth at bedtime.   ELIQUIS 5 MG TABS tablet TAKE 1 TABLET BY MOUTH TWICE A DAY   ferrous sulfate 325 (65 FE) MG tablet Take 1 tablet (325 mg total) by mouth 2 (two) times daily with a meal.   furosemide (LASIX) 40 MG tablet Take 1 tablet daily alternating with 1.5 tablets every other day   gabapentin (NEURONTIN) 100 MG capsule Take 1 capsule (100 mg total) by mouth 3 (three) times daily.   glucose blood (ACCU-CHEK AVIVA PLUS) test strip Use to check blood sugar daily DX:E11.59   ibuprofen (ADVIL) 600 MG tablet TAKE ONE TABLET BY MOUTH  PRN   metFORMIN (GLUCOPHAGE) 500 MG tablet TAKE 1 TABLET BY MOUTH THREE TIMES A DAY   metoprolol tartrate (LOPRESSOR) 50 MG tablet    pantoprazole (PROTONIX) 40 MG tablet TAKE 1 TABLET BY MOUTH EVERY DAY   repaglinide (PRANDIN) 2 MG tablet TAKE  1 TABLET BY MOUTH 3 TIMES DAILY BEFORE MEALS. (Patient taking differently: Take 2 mg by mouth 3 (three) times daily before meals.)   simvastatin (ZOCOR) 10 MG tablet TAKE 1 TABLET BY MOUTH EVERY DAY   Tiotropium Bromide-Olodaterol (STIOLTO RESPIMAT) 2.5-2.5 MCG/ACT AERS Inhale 2 puffs into the lungs daily.   vitamin B-12 (CYANOCOBALAMIN) 250 MCG tablet Take 2 tablets (500 mcg total) by mouth daily.   No current facility-administered medications for this visit. (Other)      REVIEW OF SYSTEMS:    ALLERGIES No Known Allergies  PAST MEDICAL HISTORY Past Medical History:  Diagnosis Date   Acute diastolic CHF (congestive heart failure) ([place]) 08/04/2020   Arthritis    Atrial fibrillation ([place])    post op; amiodarone and coumadin continued for 3 mos post op    CARCINOMA, SKIN, SQUAMOUS CELL 01/07/2010   Cataract    beginning of cataracts   COLONIC POLYPS, HX OF 04/26/2007   Coronary artery disease    s/p CABG 3/12: L-LAD, S-CFX (Dr. Roxy Manns); EF 45% at cath prior to CABG   DIABETES MELLITUS, TYPE II 04/26/2007   Hyperlipidemia    HYPERTENSION 04/26/2007   NEPHROLITHIASIS, HX OF 04/26/2007   SBO (small bowel obstruction) ([place]) 10/2015   Partial    Shortness of breath dyspnea    with exertion   Unspecified hearing loss 12/18/2008   Past Surgical History:  Procedure Laterality Date   BACK SURGERY     disectomy   CARDIOVERSION N/A 06/08/2018   Procedure: CARDIOVERSION;  Surgeon: Buford Dresser, MD;  Location: [facility] ENDOSCOPY;  Service: Cardiovascular;  Laterality: N/A;   CARDIOVERSION N/A 07/25/2018   Procedure: CARDIOVERSION;  Surgeon: Fay Records, MD;  Location: [place];  Service: Cardiovascular;  Laterality: N/A;   COLONOSCOPY     CORONARY ARTERY BYPASS GRAFT  01/12/2011   LUMBAR LAMINECTOMY/DECOMPRESSION MICRODISCECTOMY N/A 05/08/2015   Procedure: LUMBAR LAMINECTOMY/DECOMPRESSION MICRODISCECTOMY 1 LEVEL Lumbar four five;  Surgeon: Melina Schools, MD;  Location: [facility];  Service: Orthopedics;  Laterality: N/A;   TOTAL HIP ARTHROPLASTY Left    left on 04/15/07   TYMPANIC MEMBRANE REPAIR  1981    FAMILY HISTORY Family History  Problem Relation Age of Onset   Heart failure Mother    Heart disease Father    Cancer Sister    Cancer Brother    Heart disease Brother    Breast cancer Sister    Colon cancer Neg Hx    Esophageal cancer Neg Hx    Rectal cancer Neg Hx    Stomach cancer Neg Hx     SOCIAL HISTORY Social History   Tobacco Use   Smoking status: Former    Packs/day: 2.00    Years: 30.00    Pack years: 60.00    Types: Cigarettes    Quit date: 01/02/1981    Years since quitting: 40.4   Smokeless tobacco: Never  Vaping Use   Vaping Use: Never used  Substance Use Topics   Alcohol use: No   Drug use: No          OPHTHALMIC EXAM:  Base Eye Exam     Visual Acuity (ETDRS)       Right Left   Dist cc HM 20/20 -1    Correction: Glasses         Tonometry (Tonopen, 9:05 AM)       Right Left   Pressure 20 19         Pupils  Pupils Dark Light Shape React APD   Right PERRL 3 2 Round Brisk None   Left PERRL 3 2 Round Brisk None         Visual Fields       Left Right   Restrictions Partial inner superior temporal, inferior temporal, superior nasal, inferior nasal deficiencies          Neuro/Psych     Oriented x3: Yes         Dilation     Right eye: 1.0% Mydriacyl, 2.5% Phenylephrine @ 9:05 AM           Slit Lamp and Fundus Exam     External Exam       Right Left   External Normal Normal         Slit Lamp Exam       Right Left   Lids/Lashes Normal Normal   Conjunctiva/Sclera White and quiet White and quiet   Cornea Clear Clear   Anterior Chamber Deep and quiet Deep and quiet   Iris Round and reactive Round and reactive   Lens Clear Posterior chamber intraocular lens   Anterior Vitreous Normal Normal         Fundus Exam       Right Left   Posterior Vitreous Normal Normal   Disc Normal Normal   C/D Ratio 0.5 0.5   Macula Subretinal hemorrhage, Hemorrhage, with sub-RPE hemorrhage, approximately 3 disc areas in size with adjacent subretinal hemorrhage and other 3 disc areas in size Intermediate age related macular degeneration, Soft drusen, no macular thickening, no hemorrhage   Vessels Normal Normal   Periphery Normal Normal            IMAGING AND PROCEDURES  Imaging and Procedures for 06/18/21  OCT, Retina - OU - Both Eyes       Right Eye Quality was good. Scan locations included subfoveal. Central Foveal Thickness: 443. Progression has improved. Findings include abnormal foveal contour, intraretinal fluid, subretinal fluid, pigment epithelial detachment.   Left Eye Quality was good. Scan locations included subfoveal. Central  Foveal Thickness: 308. Progression has been stable. Findings include abnormal foveal contour, retinal drusen .   Notes Significant hemorrhagic PED OU with subretinal hemorrhage in the.  Subfoveal location.  Wet AMD OD.  Slightly improved pigment epithelial detachment 1 month after first injection Avastin.  We will repeat injection today     Intravitreal Injection, Pharmacologic Agent - OD - Right Eye       Time Out 06/18/2021. 9:34 AM. Confirmed correct patient, procedure, site, and patient consented.   Anesthesia Topical anesthesia was used. Anesthetic medications included Akten 3.5%.   Procedure Preparation included 5% betadine to ocular surface, 10% betadine to eyelids, Ofloxacin , Tobramycin 0.3%. A 30 gauge needle was used.   Injection: 2.5 mg bevacizumab 2.5 MG/0.1ML   Route: Intravitreal, Site: Right Eye   NDC: [phone], Lot: [id number]   Post-op Post injection exam found visual acuity of at least counting fingers. The patient tolerated the procedure well. There were no complications. The patient received written and verbal post procedure care education. Post injection medications were not given.              ASSESSMENT/PLAN:  Exudative age-related macular degeneration of right eye with active choroidal neovascularization (HCC) Large vascularized subfoveal pigment epithelial detachment with large area of subretinal hemorrhage.  Slightly improved today and its volume and appearance and OCT findings.  Post Avastin 1 month previous.  Repeat injection today  and continue with therapy     ICD-10-CM   1. Exudative age-related macular degeneration of right eye with active choroidal neovascularization (HCC)  H35.3211 OCT, Retina - OU - Both Eyes    Intravitreal Injection, Pharmacologic Agent - OD - Right Eye    bevacizumab (AVASTIN) SOSY 2.5 mg      1.  Large subfoveal pigment epithelial detachment hemorrhagic associated with subretinal hemorrhage now 1 month post injection  #1 Avastin.  Slightly improved volume findings on examination.  We will repeat injection today follow-up again next in 1 month  2.  3.  Ophthalmic Meds Ordered this visit:  Meds ordered this encounter  Medications   bevacizumab (AVASTIN) SOSY 2.5 mg       Return in about 5 weeks (around 07/23/2021) for dilate, OD, AVASTIN OCT.  There are no Patient Instructions on file for this visit.   Explained the diagnoses, plan, and follow up with the patient and they expressed understanding.  Patient expressed understanding of the importance of proper follow up care.   Clent Demark Kasey Hansell M.D. Diseases & Surgery of the Retina and Vitreous Retina & Diabetic [place] 06/18/21     Abbreviations: M myopia (nearsighted); A astigmatism; H hyperopia (farsighted); P presbyopia; Mrx spectacle prescription;  CTL contact lenses; OD right eye; OS left eye; OU both eyes  XT exotropia; ET esotropia; PEK punctate epithelial keratitis; PEE punctate epithelial erosions; DES dry eye syndrome; MGD meibomian gland dysfunction; ATs artificial tears; PFAT's preservative free artificial tears; [place] nuclear sclerotic cataract; PSC posterior subcapsular cataract; ERM epi-retinal membrane; PVD posterior vitreous detachment; RD retinal detachment; DM diabetes mellitus; DR diabetic retinopathy; NPDR non-proliferative diabetic retinopathy; PDR proliferative diabetic retinopathy; CSME clinically significant macular edema; DME diabetic macular edema; dbh dot blot hemorrhages; CWS cotton wool spot; POAG primary open angle glaucoma; C/D cup-to-disc ratio; HVF humphrey visual field; GVF goldmann visual field; OCT optical coherence tomography; IOP intraocular pressure; BRVO Branch retinal vein occlusion; CRVO central retinal vein occlusion; CRAO central retinal artery occlusion; BRAO branch retinal artery occlusion; RT retinal tear; SB scleral buckle; PPV pars plana vitrectomy; VH Vitreous hemorrhage; PRP panretinal laser  photocoagulation; IVK intravitreal kenalog; VMT vitreomacular traction; MH Macular hole;  NVD neovascularization of the disc; NVE neovascularization elsewhere; AREDS age related eye disease study; ARMD age related macular degeneration; POAG primary open angle glaucoma; EBMD epithelial/anterior basement membrane dystrophy; ACIOL anterior chamber intraocular lens; IOL intraocular lens; PCIOL posterior chamber intraocular lens; Phaco/IOL phacoemulsification with intraocular lens placement; [place] photorefractive keratectomy; LASIK laser assisted in situ keratomileusis; HTN hypertension; DM diabetes mellitus; COPD chronic obstructive pulmonary disease

## 2021-06-18 NOTE — Assessment & Plan Note (Signed)
Large vascularized subfoveal pigment epithelial detachment with large area of subretinal hemorrhage.  Slightly improved today and its volume and appearance and OCT findings.  Post Avastin 1 month previous.  Repeat injection today and continue with therapy

## 2021-06-25 ENCOUNTER — Telehealth: Payer: Self-pay

## 2021-06-25 NOTE — Chronic Care Management (AMB) (Signed)
Chronic Care Management Pharmacy Assistant   Name: [name]  MRN: [id number] DOB: 09-10-1935  [name] is an 86 y.o. year old male who presents for his initial CCM visit with the clinical pharmacist.   Recent office visits:  07/14/2-John V. [name], MD (PCP) 3 month follow up. Labs ordered. Follow up in 3 months. 02/27/21-John V. [name],MD (PCP) 3 month follow up. Labs ordered. Follow up in 3 months.  Recent consult visits:  06/18/21-John A. Rankin, MD (Ophthalmology) 5 week follow up. Start Avasatin SOSY 2.5 mg. Follow up in 5 weeks. 05/12/21-John A. Rankin, MD (Retina Specialist) Seen for Retina evaluation. Start Avastin SOSY 2.75m. Follow up in 5 weeks. 03/03/21-[name](Chiropractic Medicine) Notes note available.  02/10/21-[name] (Dermatology) Notes not available. 01/30/21-[name] (Dermatology) Notes not available.  Hospital visits:  None in previous 6 months  Medications: Outpatient Encounter Medications as of 06/25/2021  Medication Sig   Accu-Chek Softclix Lancets lancets Use to check blood sugar daily DX:E11.59   albuterol (VENTOLIN HFA) 108 (90 Base) MCG/ACT inhaler TAKE 2 PUFFS BY MOUTH EVERY 6 HOURS AS NEEDED FOR WHEEZE OR SHORTNESS OF BREATH   amiodarone (PACERONE) 200 MG tablet Take 1 tablet (200 mg total) by mouth daily. Except Sunday.   bisacodyl (DULCOLAX) 5 MG EC tablet Take 5 mg by mouth daily as needed for moderate constipation.   Blood Glucose Monitoring Suppl (ACCU-CHEK AVIVA PLUS) w/Device KIT Use to check blood sugar daily DX:E11.59   cholecalciferol (VITAMIN D) 1000 units tablet Take 1,000 Units by mouth 2 (two) times daily.   docusate sodium (COLACE) 100 MG capsule Take 300 mg by mouth at bedtime.   ELIQUIS 5 MG TABS tablet TAKE 1 TABLET BY MOUTH TWICE A DAY   ferrous sulfate 325 (65 FE) MG tablet Take 1 tablet (325 mg total) by mouth 2 (two) times daily with a meal.   furosemide (LASIX) 40 MG tablet Take  1 tablet daily alternating with 1.5 tablets every other day   gabapentin (NEURONTIN) 100 MG capsule Take 1 capsule (100 mg total) by mouth 3 (three) times daily.   glucose blood (ACCU-CHEK AVIVA PLUS) test strip Use to check blood sugar daily DX:E11.59   ibuprofen (ADVIL) 600 MG tablet TAKE ONE TABLET BY MOUTH  PRN   latanoprost (XALATAN) 0.005 % ophthalmic solution Place 1 drop into both eyes daily.   metFORMIN (GLUCOPHAGE) 500 MG tablet TAKE 1 TABLET BY MOUTH THREE TIMES A DAY   metoprolol tartrate (LOPRESSOR) 50 MG tablet    pantoprazole (PROTONIX) 40 MG tablet TAKE 1 TABLET BY MOUTH EVERY DAY   Propylene Glycol 0.6 % SOLN Place 1 drop into both eyes 2 (two) times daily as needed (for dry eyes).   repaglinide (PRANDIN) 2 MG tablet TAKE 1 TABLET BY MOUTH 3 TIMES DAILY BEFORE MEALS. (Patient taking differently: Take 2 mg by mouth 3 (three) times daily before meals.)   simvastatin (ZOCOR) 10 MG tablet TAKE 1 TABLET BY MOUTH EVERY DAY   Tiotropium Bromide-Olodaterol (STIOLTO RESPIMAT) 2.5-2.5 MCG/ACT AERS Inhale 2 puffs into the lungs daily.   vitamin B-12 (CYANOCOBALAMIN) 250 MCG tablet Take 2 tablets (500 mcg total) by mouth daily.   No facility-administered encounter medications on file as of 06/25/2021.    Albuterol (VENTOLIN HFA) 108 (90 Base) MCG/ACT inhaler Last filled:01/30/21 None noted DS Amiodarone (PACERONE) 200 MG tablet Last filled:05/25/21 90 DS Bisacodyl (DULCOLAX) 5 MG EC tablet Last filled:None noted Cholecalciferol (VITAMIN D) 1000 units tablet Last filled:  None noted Docusate sodium (COLACE) 100 MG capsule Last filled:None noted ELIQUIS 5 MG TABS tablet Last filled:05/10/21 30 DS Ferrous sulfate 325 (65 FE) MG tablet Last filled:05/02/21 90 DS Furosemide (LASIX) 40 MG tablet Last filled:02/13/21 36 DS Gabapentin (NEURONTIN) 100 MG capsule Last filled:04/21/21 90 DS Ibuprofen (ADVIL) 600 MG tablet Last filled:None noted Latanoprost (XALATAN) 0.005 % ophthalmic solution  Last filled:01/30/21 None noted DS MetFORMIN (GLUCOPHAGE) 500 MG tablet Last filled:05/25/21 90 DS Metoprolol tartrate (LOPRESSOR) 50 MG tablet Last filled:01/30/21 None noted DS Pantoprazole (PROTONIX) 40 MG tablet Last filled:05/03/21 90 DS Propylene Glycol 0.6 % SOLN Last filled:None noted Repaglinide (PRANDIN) 2 MG tablet Last filled:06/20/21 90 DS Simvastatin (ZOCOR) 10 MG tablet Last filled:04/08/21 90 DS Tiotropium Bromide-Olodaterol (STIOLTO RESPIMAT) 2.5-2.5 MCG/ACT AERS Last filled:None noted Vitamin B-12 (CYANOCOBALAMIN) 250 MCG tablet Last filled:None noted   Star Rating Drugs: Simvastatin (ZOCOR) 10 MG tablet Last filled:04/08/21 90 DS MetFORMIN (GLUCOPHAGE) 500 MG tablet Last filled:05/25/21 90 DS  Myriam Elta [place], [place]

## 2021-07-15 ENCOUNTER — Other Ambulatory Visit: Payer: Self-pay

## 2021-07-15 ENCOUNTER — Ambulatory Visit (INDEPENDENT_AMBULATORY_CARE_PROVIDER_SITE_OTHER): Payer: Medicare Other

## 2021-07-15 DIAGNOSIS — I480 Paroxysmal atrial fibrillation: Secondary | ICD-10-CM

## 2021-07-15 DIAGNOSIS — E1169 Type 2 diabetes mellitus with other specified complication: Secondary | ICD-10-CM | POA: Diagnosis not present

## 2021-07-15 DIAGNOSIS — E1159 Type 2 diabetes mellitus with other circulatory complications: Secondary | ICD-10-CM

## 2021-07-15 DIAGNOSIS — I152 Hypertension secondary to endocrine disorders: Secondary | ICD-10-CM

## 2021-07-15 DIAGNOSIS — I2510 Atherosclerotic heart disease of native coronary artery without angina pectoris: Secondary | ICD-10-CM

## 2021-07-15 DIAGNOSIS — E785 Hyperlipidemia, unspecified: Secondary | ICD-10-CM

## 2021-07-15 NOTE — Progress Notes (Addendum)
Chronic Care Management Pharmacy Note  07/15/2021 Name:  [name] MRN:  [id number] DOB:  1935-11-05  Summary: - Patient reports that since last visit his cardiologist has discontinued metoprolol, increase furosemide to 4m daily - patient reports that blood pressure have been averaging 120-130/70-75 -Notes that blood sugars when he has checked are usually averaging 110-118, highest he could recall was in the 130's, denies issues with hypoglycemia -Confirms he has been taking iron and vitamin B12 supplements, has not had labs rechecked since started -Reports that he has had issues with shortness of breath since he has [place], has not been using prescribed inhalers as he does not find to be overly beneficial when used in the past  Recommendations/Changes made from today's visit: -Recommending for patient to continue to monitor blood pressure and blood sugars at least 3 times weekly -Patient to start using stiolto - 2 puffs daily - will start using consistently to determine extent of benefit from trial of medications, reviewed when to use albuterol HFA, and difference between maintenance and rescue inhalers -Advised for patient to stop ibuprofen / oral NSAIDs, instead patient to use acetaminophen (up to 2,0036mdaily) or diclofenac 1% gel 4 times daily as needed    Subjective: John MCNABs an [age].o. year old male who is a primary patient of [name].  The CCM team was consulted for assistance with disease management and care coordination needs.    Engaged with patient by telephone for initial visit in response to provider referral for pharmacy case management and/or care coordination services.   Consent to Services:  The patient was given the following information about Chronic Care Management services today, agreed to services, and gave verbal consent: 1. CCM service includes personalized support from designated clinical staff  supervised by the primary care provider, including individualized plan of care and coordination with other care providers 2. 24/7 contact phone numbers for assistance for urgent and routine care needs. 3. Service will only be billed when office clinical staff spend 20 minutes or more in a month to coordinate care. 4. Only one practitioner may furnish and bill the service in a calendar month. 5.The patient may stop CCM services at any time (effective at the end of the month) by phone call to the office staff. 6. The patient will be responsible for cost sharing (co-pay) of up to 20% of the service fee (after annual deductible is met). Patient agreed to services and consent obtained.  Patient Care Team: [name] as PCP - General (Internal Medicine) [name] as PCP - Cardiology (Cardiology) [name] as Consulting Physician (Orthopedic Surgery) [name] as Consulting Physician (Physical Medicine and Rehabilitation) [name] as Consulting Physician (Ophthalmology) [name] as Consulting Physician (Ophthalmology) [name] as Consulting Physician (Orthopedic Surgery) [name], DaDarnelle MaffucciRP[facility] Pharmacist (Pharmacist)  Recent office visits:  07/14/2-Aleksei V. [name], MD (PCP) 3 month follow up. Labs ordered. Follow up in 3 months. 02/27/21-Aleksei V. [name],MD (PCP) 3 month follow up. Labs ordered. Follow up in 3 months.   Recent consult visits:  06/18/21-Gary A. Rankin, MD (Ophthalmology) 5 week follow up. Start Avasatin SOSY 2.5 mg. Follow up in 5 weeks. 05/12/21-Gary A. Rankin, MD (Retina Specialist) Seen for Retina evaluation. Start Avastin SOSY 2.79m64mFollow up in 5 weeks. 03/03/21-[facility]) Notes note available.  02/10/21-Mario Mitkov (Dermatology) Notes not available. 01/30/21-Mario Mitkov (  Dermatology) Notes not available.   Hospital visits:  None in previous 6  months  Objective:  Lab Results  Component Value Date   CREATININE 1.37 (H) 04/07/2021   BUN 22 04/07/2021   GFR 38.57 (L) 02/27/2021   GFRNONAA 53 (L) 11/22/2020   GFRAA 62 11/22/2020   NA 137 04/07/2021   K 3.9 04/07/2021   CALCIUM 9.2 04/07/2021   CO2 22 04/07/2021   GLUCOSE 145 (H) 04/07/2021    Lab Results  Component Value Date/Time   HGBA1C 7.5 (H) 02/27/2021 03:30 PM   HGBA1C 6.9 (H) 11/29/2020 02:56 PM   GFR 38.57 (L) 02/27/2021 03:30 PM   GFR 44.83 (L) 11/29/2020 02:56 PM   MICROALBUR 0.7 04/04/2018 02:16 PM    Last diabetic Eye exam:  Lab Results  Component Value Date/Time   HMDIABEYEEXA Retinopathy (A) 04/30/2021 12:00 AM    Last diabetic Foot exam:  Lab Results  Component Value Date/Time   HMDIABFOOTEX done 11/17/2011 12:00 AM     Lab Results  Component Value Date   CHOL 126 08/09/2020   HDL 30 (L) 08/09/2020   LDLCALC 70 08/09/2020   TRIG 149 08/09/2020   CHOLHDL 4.2 08/09/2020    Hepatic Function Latest Ref Rng & Units 02/27/2021 08/15/2020 08/05/2020  Total Protein 6.0 - 8.3 g/dL 7.2 6.9 7.1  Albumin 3.5 - 5.2 g/dL 3.8 - 3.8  AST 0 - 37 U/L '18 16 25  ' ALT 0 - 53 U/L '14 18 22  ' Alk Phosphatase 39 - 117 U/L 45 - 44  Total Bilirubin 0.2 - 1.2 mg/dL 0.4 0.4 0.8  Bilirubin, Direct 0.00 - 0.40 mg/dL - - -    Lab Results  Component Value Date/Time   TSH 3.740 08/09/2020 01:56 PM   TSH 3.310 12/22/2019 12:21 PM    CBC Latest Ref Rng & Units 04/23/2021 04/07/2021 03/25/2021  WBC 3.4 - 10.8 x10E3/uL 9.7 8.1 6.1  Hemoglobin 13.0 - 17.7 g/dL 11.5(L) 9.3(L) 9.1(L)  Hematocrit 37.5 - 51.0 % 37.9 31.4(L) 30.6(L)  Platelets 150 - 450 x10E3/uL 294 357 372    No results found for: VD25OH  Clinical ASCVD: No  The ASCVD Risk score Mikey Bussing DC Jr., et al., 2013) failed to calculate for the following reasons:   The 2013 ASCVD risk score is only valid for ages 54 to 80    Depression screen PHQ 2/9 05/29/2021 09/10/2020 07/02/2017  Decreased Interest 0 0 1  Down,  Depressed, Hopeless 0 0 1  PHQ - 2 Score 0 0 2  Altered sleeping - - 2  Tired, decreased energy - - 1  Change in appetite - - 0  Feeling bad or failure about yourself  - - 0  Trouble concentrating - - 0  Moving slowly or fidgety/restless - - 0  Suicidal thoughts - - 0  PHQ-9 Score - - 5  Difficult doing work/chores - - Not difficult at all  Some recent data might be hidden   Social History   Tobacco Use  Smoking Status Former   Packs/day: 2.00   Years: 30.00   Pack years: 60.00   Types: Cigarettes   Quit date: 01/02/1981   Years since quitting: 40.5  Smokeless Tobacco Never   BP Readings from Last 3 Encounters:  05/29/21 120/70  05/29/21 120/70  02/27/21 128/62   Pulse Readings from Last 3 Encounters:  05/29/21 79  05/29/21 79  02/27/21 77   Wt Readings from Last 3 Encounters:  05/29/21 227 lb 3.2 oz (103.1  kg)  05/29/21 227 lb 3.2 oz (103.1 kg)  04/03/21 228 lb (103.4 kg)   BMI Readings from Last 3 Encounters:  05/29/21 34.55 kg/m  05/29/21 34.55 kg/m  04/03/21 34.67 kg/m    Assessment/Interventions: Review of patient past medical history, allergies, medications, health status, including review of consultants reports, laboratory and other test data, was performed as part of comprehensive evaluation and provision of chronic care management services.   SDOH:  (Social Determinants of Health) assessments and interventions performed: Yes  SDOH Screenings   Alcohol Screen: Low Risk    Last Alcohol Screening Score (AUDIT): 0  Depression (PHQ2-9): Low Risk    PHQ-2 Score: 0  Financial Resource Strain: Low Risk    Difficulty of Paying Living Expenses: Not hard at all  Food Insecurity: No Food Insecurity   Worried About Charity fundraiser in the Last Year: Never true   Ran Out of Food in the Last Year: Never true  Housing: Low Risk    Last Housing Risk Score: 0  Physical Activity: Inactive   Days of Exercise per Week: 0 days   Minutes of Exercise per  Session: 0 min  Social Connections: Engineer, building services of Communication with Friends and Family: More than three times a week   Frequency of Social Gatherings with Friends and Family: Once a week   Attends Religious Services: More than 4 times per year   Active Member of [employer] or Organizations: No   Attends Music therapist: More than 4 times per year   Marital Status: Married  Stress: No Stress Concern Present   Feeling of Stress : Not at all  Tobacco Use: Medium Risk   Smoking Tobacco Use: Former   Smokeless Tobacco Use: Never  Transportation Needs: No Data processing manager (Medical): No   Lack of Transportation (Non-Medical): No    CCM Care Plan  No Known Allergies  Medications Reviewed Today     Reviewed by [name], [facility] (Pharmacist) on 07/15/21 at 1610  Med List Status: <None>   Medication Order Taking? Sig Documenting Provider Last Dose Status Informant  Accu-Chek Softclix Lancets lancets [id number] Yes Use to check blood sugar daily DX:E11.59 [name], Evie Lacks, MD Taking Active Self  albuterol (VENTOLIN HFA) 108 (90 Base) MCG/ACT inhaler [id number] No TAKE 2 PUFFS BY MOUTH EVERY 6 HOURS AS NEEDED FOR WHEEZE OR SHORTNESS OF BREATH  Patient not taking: Reported on 07/15/2021   Collene Gobble, MD Not Taking Active   amiodarone (PACERONE) 200 MG tablet [id number] Yes Take 1 tablet (200 mg total) by mouth daily. Except Sunday. Fay Records, MD Taking Active   bisacodyl (DULCOLAX) 5 MG EC tablet [id number] Yes Take 5 mg by mouth daily as needed for moderate constipation. [provider] Taking Active   Blood Glucose Monitoring Suppl (ACCU-CHEK AVIVA PLUS) w/Device KIT [id number] Yes Use to check blood sugar daily DX:E11.59 [name], Evie Lacks, MD Taking Active Self  cholecalciferol (VITAMIN D) 1000 units tablet [id number] Yes Take 2,000 Units by mouth daily. [provider] Taking Active Self  docusate  sodium (COLACE) 100 MG capsule [id number] Yes Take 200 mg by mouth at bedtime. [provider] Taking Active   ELIQUIS 5 MG TABS tablet [id number] Yes TAKE 1 TABLET BY MOUTH TWICE A DAY Fay Records, MD Taking Active   ferrous sulfate 325 (65 FE) MG tablet [id number] Yes Take 1 tablet (325 mg total) by mouth 2 (two) times  daily with a meal. Fay Records, MD Taking Active   furosemide (LASIX) 40 MG tablet [id number] Yes Take 1 tablet daily alternating with 1.5 tablets every other day  Patient taking differently: Take 80 mg by mouth daily. Take 1 tablet daily alternating with 1.5 tablets every other day   Fay Records, MD Taking Active   gabapentin (NEURONTIN) 100 MG capsule [id number] Yes Take 1 capsule (100 mg total) by mouth 3 (three) times daily. [name], Evie Lacks, MD Taking Active   glucose blood (ACCU-CHEK AVIVA PLUS) test strip [id number] Yes Use to check blood sugar daily DX:E11.59 [name], Evie Lacks, MD Taking Active Self  Discontinued 07/15/21 1610 (Change in therapy)   latanoprost (XALATAN) 0.005 % ophthalmic solution [id number] Yes Place 1 drop into both eyes daily. [provider] Taking Active Self  metFORMIN (GLUCOPHAGE) 500 MG tablet [id number] Yes TAKE 1 TABLET BY MOUTH THREE TIMES A DAY [name], Evie Lacks, MD Taking Active   metoprolol tartrate (LOPRESSOR) 50 MG tablet [id number] No   Patient not taking: Reported on 07/15/2021   [provider] Not Taking Active   pantoprazole (PROTONIX) 40 MG tablet [id number] Yes TAKE 1 TABLET BY MOUTH EVERY DAY [name], Evie Lacks, MD Taking Active   Propylene Glycol 0.6 % SOLN [id number] Yes Place 1 drop into the left eye 2 (two) times daily as needed (for dry eyes). [provider] Taking Active Self  repaglinide (PRANDIN) 2 MG tablet [id number] Yes TAKE 1 TABLET BY MOUTH 3 TIMES DAILY BEFORE MEALS.  Patient taking differently: Take 2 mg by mouth 3 (three) times daily before meals.   [name], Evie Lacks, MD  Taking Active   simvastatin (ZOCOR) 10 MG tablet [id number] Yes TAKE 1 TABLET BY MOUTH EVERY DAY Fay Records, MD Taking Active   Tiotropium Bromide-Olodaterol (STIOLTO RESPIMAT) 2.5-2.5 MCG/ACT AERS [id number] No Inhale 2 puffs into the lungs daily.  Patient not taking: Reported on 07/15/2021   Martyn Ehrich, NP Not Taking Active   vitamin B-12 (CYANOCOBALAMIN) 250 MCG tablet [id number] Yes Take 2 tablets (500 mcg total) by mouth daily. 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 eye with active choroidal neovascularization ([place]) 05/12/2021   Intermediate stage nonexudative age-related macular degeneration of both eyes 05/12/2021   Left epiretinal membrane 05/12/2021   Atherosclerosis of aorta ([place]) 03/03/2021   Squamous cell skin cancer 01/12/2021   Chronic anticoagulation 12/26/2020   Essential hypertension    SBO (small bowel obstruction) ([place]) 84/01/7542   Acute diastolic CHF (congestive heart failure) ([place]) 08/04/2020   ILD (interstitial lung disease) ([place]) 07/31/2020   Acute respiratory disease due to COVID-19 virus 10/24/2019   Acute respiratory failure with hypoxia ([place]) 10/24/2019   Near syncope 04/12/2019   Dysphagia 02/21/2019   Dyspnea 02/21/2019   History of total knee replacement, left 11/02/2018   Spinal stenosis of lumbar region 10/03/2018   Diabetic neuropathy ([place]) 07/05/2018   RUQ abdominal pain 04/04/2018   Vertigo 04/04/2018   Well adult exam 07/02/2017   Abdominal pain 07/02/2017   Constipation 02/18/2016   Small bowel obstruction ([place]) 11/06/2015   Back pain 05/08/2015   Cough 10/17/2014   Hyperlipidemia associated with type 2 diabetes mellitus ([place]) 02/09/2011   CAD (coronary artery disease), native coronary artery 02/09/2011   Paroxysmal atrial fibrillation ([place]) 01/20/2011  Neoplasm of uncertain behavior of skin  01/07/2010   Unspecified hearing loss 12/18/2008   Diabetes mellitus, type 2 ([place]) 04/26/2007   Hypertension associated with diabetes ([place]) 04/26/2007   COLONIC POLYPS, HX OF 04/26/2007   NEPHROLITHIASIS, HX OF 04/26/2007    Immunization History  Administered Date(s) Administered   Fluad Quad(high Dose 65+) 08/18/2019, 11/29/2020   Influenza Nasal 08/28/2016   Influenza Whole 09/14/2007, 09/01/2010   Influenza, High Dose Seasonal PF 09/13/2009, 08/01/2012, 08/29/2014, 09/06/2017, 08/16/2018   Influenza,inj,Quad PF,6+ Mos 08/20/2015   Influenza-Unspecified 08/16/2008, 09/16/2008, 08/28/2010, 08/17/2011, 08/25/2013, 08/16/2014   PFIZER(Purple Top)SARS-COV-2 Vaccination 02/23/2020, 03/19/2020   Pneumococcal Conjugate-13 08/16/2014, 08/29/2014   Pneumococcal Polysaccharide-23 10/16/2002, 02/18/2016   Pneumococcal-Unspecified 08/16/2005, 08/28/2010   Td 11/16/1993, 12/18/2008   Tdap 11/16/2009   Zoster, Live 07/24/2011    Conditions to be addressed/monitored:  Hypertension, Hyperlipidemia, Diabetes, Atrial Fibrillation, Coronary Artery Disease, GERD, and Neuropathy   Care Plan : CCM Care Plan  Updates made by [name], RPH since 07/15/2021 12:00 AM     Problem: HTN, HLD, CAD, DM2 Afib, Fe and B12 Anemia, GERD, Shortness of Breath, Neuropathy   Priority: High  Onset Date: 07/15/2021     Long-Range Goal: Disease Management   Start Date: 07/15/2021  Expected End Date: 01/13/2022  This Visit's Progress: On track  Priority: High  Note:   Current Barriers:  Unable to independently monitor therapeutic efficacy  Pharmacist Clinical Goal(s):  Patient will achieve adherence to monitoring guidelines and medication adherence to achieve therapeutic efficacy maintain control of Blood pressure/ HR, A1c, LDL, GERD, Neuropathy as evidenced by BP/HR and blood sugar logs, next lipid panel, acid reflux occurrent, and pain levels  through collaboration with PharmD and provider.    Interventions: 1:1 collaboration with [name], Evie Lacks, MD regarding development and update of comprehensive plan of care as evidenced by provider attestation and co-signature Inter-disciplinary care team collaboration (see longitudinal plan of care) Comprehensive medication review performed; medication list updated in electronic medical record  Hypertension (BP goal <140/90) -Controlled -Current treatment: Furosemide 60m daily  -Medications previously tried: diltiazem, lisinopril, metoprolol succinate,   -Current home readings: averaging 120-130/72-75 -Current dietary habits: low sodium, does not drink coffee, drinks diet cheerwine -12oz daily  -Current exercise habits: none at this time  -Denies hypotensive/hypertensive symptoms -Educated on BP goals and benefits of medications for prevention of heart attack, stroke and kidney damage; Daily salt intake goal < 2300 mg; Importance of home blood pressure monitoring; Proper BP monitoring technique; Symptoms of hypotension and importance of maintaining adequate hydration; -Counseled to monitor BP at home 3 times weekly, document, and provide log at future appointments -Counseled on diet and exercise extensively Recommended to continue current medication  Hyperlipidemia: (LDL goal < 70) -Controlled Lab Results  Component Value Date   LDLCALC 70 08/09/2020  -Current treatment: Simvastatin 150m- 1 tablet daily  -Medications previously tried: n/a  -Current dietary patterns: notes that at times diet can be elevated in high cholesterol foods -Current exercise habits: none at this time  -Educated on Cholesterol goals;  Benefits of statin for ASCVD risk reduction; Importance of limiting foods high in cholesterol; Exercise goal of 150 minutes per week; Strategies to manage statin-induced myalgias; -Counseled on diet and exercise extensively Recommended to continue current medication  Diabetes (A1c goal <8%) -Controlled Lab  Results  Component Value Date   HGBA1C 7.5 (H) 02/27/2021  -Current medications: Repaglidine 63m63m 1 tablet 3 times daily before meals  Metformin 500m363m1 tablet  3 times daily  -Medications previously tried: n/a  -Current home glucose readings fasting glucose: averaging 110-118, highest he has seen it was 130 -Denies hypoglycemic/hyperglycemic symptoms -Current meal patterns:  breakfast: Eggs, sausage, fruit  lunch: smaller meal, depends on what is available to him, could not specify  dinner: vegetables and meat snacks: oatmeal cookie,  drinks: water or diet cheerwine  -Current exercise: none at this time  -Educated on A1c and blood sugar goals; Complications of diabetes including kidney damage, retinal damage, and cardiovascular disease; Exercise goal of 150 minutes per week; Benefits of weight loss; Prevention and management of hypoglycemic episodes; Benefits of routine self-monitoring of blood sugar; Carbohydrate counting and/or plate method -Counseled to check feet daily and get yearly eye exams -Counseled on diet and exercise extensively Recommended to continue current medication  Atrial Fibrillation (Goal: prevent stroke and major bleeding) -Controlled -CHADSVASC: at least 5 -Current treatment: Rate control: Amiodarone 250m - 1 tablet daily except Sundays  Anticoagulation: Eliquis 532m- 1 tablet twice daily  -Medications previously tried: warfarin -Home BP and HR readings:   -Counseled on increased risk of stroke due to Afib and benefits of anticoagulation for stroke prevention; importance of adherence to anticoagulant exactly as prescribed; bleeding risk associated with Eliquis and importance of self-monitoring for signs/symptoms of bleeding; avoidance of NSAIDs due to increased bleeding risk with anticoagulants; importance of regular laboratory monitoring; seeking medical attention after a head injury or if there is blood in the urine/stool; -Counseled on diet and  exercise extensively Recommended for patient to stop use of ibuprofen that he reports taking due to increase risk of bleeding while taking  Shortness of Breath / Dyspnea (Goal: control symptoms and prevent exacerbations) -Not ideally controlled -Current treatment  Tiotropium Bromide-Olodaterol (Stiolto) 2.5-2.20m46mact - 2 puffs daily  Albuterol 108m1mct HFA inhaler - 2 puffs every 6 hours as needed  -Medications previously tried: n/a  -Exacerbations requiring treatment in last 6 months: none -Patient denies consistent use of maintenance inhaler -Frequency of rescue inhaler use: does not use -Counseled on Proper inhaler technique; Benefits of consistent maintenance inhaler use When to use rescue inhaler Differences between maintenance and rescue inhalers -Recommended to continue current medication  GERD (Goal: Prevention of acid reflux/heartburn) -Controlled -Current treatment  Pantoprazole 40mg2m tablet daily  -Medications previously tried: omeprazole   -Recommended to continue current medication  Neuropathy (Goal: Pain control) -Controlled -Current treatment  Gabapentin 100mg 77mcapsule 3 times daily  -Medications previously tried: n/a  -Recommended to continue current medication   Glaucoma (Goal: Control of occular pressure)- follows with Dr. RankinZadie Rhinetrolled -Current treatment  Latanoprost 0.005% solution - 1 drop into both eyes daily - patient reports that he is only using in right eye at this time  -Medications previously tried: n/a  -Recommended to continue current medication  Iron/B12 Deficiency Anemia (Goal: maintenance of appropriate Iron and B12 levels) -Not ideally controlled - B12 and Iron Binding/Ferritin/Folate levels have not been rechecked since addition of medications  -Current treatment  Vitamin B12 250mcg 67mtablet daily Ferrous Sulfate 3220mg - 73mblet twice daily  -Medications previously tried: n/a  -Counseled on diet and exercise  extensively Recommended to continue current medication  Health Maintenance -Vaccine gaps: Shingles, Tdap, COVID booster, Influenza vaccines  -Current therapy:  Ibuprofen 600mg - 172mlet every 6 hours as needed  Docusate 100mg - 2 720mules daily  Bisacodyl 20mg - 1 ta31mt daily as needed  Vitamin D 1000 units - 2 tablet daily  Propylene Glycol 0.6% solution - 1 drop into both eyes twice daily as needed  -Educated on Cost vs benefit of each product must be carefully weighed by individual consumer Supplements may interfere with prescription drugs -Patient is satisfied with current therapy and denies issues -Recommended for patient to stop ibuprofen and use acetaminophen (up to 2028m daily) or volatren gel 1% - applied 4 times daily for pain instead of oral NSAIDs due to bleeding risk   Patient Goals/Self-Care Activities Patient will:  - take medications as prescribed check glucose 3 times weekly, document, and provide at future appointments check blood pressure 3 times wekly, document, and provide at future appointments engage in dietary modifications by reducing carbohydrate, high cholesterol, and sodium intake  Follow Up Plan: Telephone follow up appointment with care management team member scheduled for: The patient has been provided with contact information for the care management team and has been advised to call with any health related questions or concerns.     Medication Assistance: None required.  Patient affirms current coverage meets needs.  Patient's preferred pharmacy is:  [id number] [place], [place]one: [phone]: [phone]  ([facility] and [facility] [place]hone: [phone]: [phone] Uses pill box? Yes Pt endorses 80-85%  compliance  Care Plan and Follow Up Patient Decision:  Patient agrees to Care Plan and Follow-up.  Plan: Telephone follow up appointment with care management team member scheduled for:  4 months and The patient has been provided with contact information for the care management team and has been advised to call with any health related questions or concerns.   DaTomasa BlasePharmD Clinical Pharmacist, [place]iftoncreening examination/treatment/procedure(s) were performed by non-physician practitioner and as supervising physician I was immediately available for consultation/collaboration.  I agree with above. AlLew DawesMD

## 2021-07-15 NOTE — Patient Instructions (Signed)
Visit Information   PATIENT GOALS:   Goals Addressed             This Visit's Progress    Monitor and Manage My Blood Sugar-Diabetes Type 2       Timeframe:  Long-Range Goal Priority:  High Start Date: 07/15/2021                            Expected End Date:  01/14/2022                     Follow Up Date 11/05/2021   - check blood sugar at prescribed times - check blood sugar before and after exercise - check blood sugar if I feel it is too high or too low - take the blood sugar log to all doctor visits - take the blood sugar meter to all doctor visits    Why is this important?   Checking your blood sugar at home helps to keep it from getting very high or very low.  Writing the results in a diary or log helps the doctor know how to care for you.  Your blood sugar log should have the time, date and the results.  Also, write down the amount of insulin or other medicine that you take.  Other information, like what you ate, exercise done and how you were feeling, will also be helpful.       Track and Manage My Blood Pressure and Heart Rate -Hypertension/ Afib       Timeframe:  Long-Range Goal Priority:  High Start Date:   07/15/2021                          Expected End Date: 01/14/2022                      Follow Up Date 11/05/2021   - check blood pressure 3 times per week - choose a place to take my blood pressure (home, clinic or office, retail store) - write blood pressure results in a log or diary    Why is this important?   You won't feel high blood pressure, but it can still hurt your blood vessels.  High blood pressure can cause heart or kidney problems. It can also cause a stroke.  Making lifestyle changes like losing a little weight or eating less salt will help.  Checking your blood pressure at home and at different times of the day can help to control blood pressure.  If the doctor prescribes medicine remember to take it the way the doctor ordered.  Call the office  if you cannot afford the medicine or if there are questions about it.           Consent to CCM Services: [name] was given information about Chronic Care Management services including:  CCM service includes personalized support from designated clinical staff supervised by his physician, including individualized plan of care and coordination with other care providers 24/7 contact phone numbers for assistance for urgent and routine care needs. Service will only be billed when office clinical staff spend 20 minutes or more in a month to coordinate care. Only one practitioner may furnish and bill the service in a calendar month. The patient may stop CCM services at any time (effective at the end of the month) by phone call to the office staff. The patient will  be responsible for cost sharing (co-pay) of up to 20% of the service fee (after annual deductible is met).  Patient agreed to services and verbal consent obtained.   Patient verbalizes understanding of instructions provided today and agrees to view in [place].   Telephone follow up appointment with care management team member scheduled for: 4 months The patient has been provided with contact information for the care management team and has been advised to call with any health related questions or concerns.   [name], PharmD Clinical Pharmacist, [place]   CLINICAL CARE PLAN: Patient Care Plan: CCM Care Plan     Problem Identified: HTN, HLD, CAD, Afib, Fe and B12 Anemia, Shortness of Breather, Neuropathy   Priority: High  Onset Date: 07/15/2021     Long-Range Goal: Disease Management   Start Date: 07/15/2021  Expected End Date: 01/13/2022  This Visit's Progress: On track  Priority: High  Note:   Current Barriers:  Unable to independently monitor therapeutic efficacy  Pharmacist Clinical Goal(s):  Patient will achieve adherence to monitoring guidelines and medication adherence to achieve  therapeutic efficacy maintain control of Blood pressure/ HR, A1c, LDL, GERD, Neuropathy as evidenced by BP/HR and blood sugar logs, next lipid panel, acid reflux occurrent, and pain levels  through collaboration with PharmD and provider.   Interventions: 1:1 collaboration with [name], Evie Lacks, MD regarding development and update of comprehensive plan of care as evidenced by provider attestation and co-signature Inter-disciplinary care team collaboration (see longitudinal plan of care) Comprehensive medication review performed; medication list updated in electronic medical record  Hypertension (BP goal <140/90) -Controlled -Current treatment: Furosemide 37m daily  -Medications previously tried: diltiazem, lisinopril, metoprolol succinate,   -Current home readings: averaging 120-130/72-75 -Current dietary habits: low sodium, does not drink coffee, drinks diet cheerwine -12oz daily  -Current exercise habits: none at this time  -Denies hypotensive/hypertensive symptoms -Educated on BP goals and benefits of medications for prevention of heart attack, stroke and kidney damage; Daily salt intake goal < 2300 mg; Importance of home blood pressure monitoring; Proper BP monitoring technique; Symptoms of hypotension and importance of maintaining adequate hydration; -Counseled to monitor BP at home 3 times weekly, document, and provide log at future appointments -Counseled on diet and exercise extensively Recommended to continue current medication  Hyperlipidemia: (LDL goal < 70) -Controlled Lab Results  Component Value Date   LDLCALC 70 08/09/2020  -Current treatment: Simvastatin 127m- 1 tablet daily  -Medications previously tried: n/a  -Current dietary patterns: notes that at times diet can be elevated in high cholesterol foods -Current exercise habits: none at this time  -Educated on Cholesterol goals;  Benefits of statin for ASCVD risk reduction; Importance of limiting foods high  in cholesterol; Exercise goal of 150 minutes per week; Strategies to manage statin-induced myalgias; -Counseled on diet and exercise extensively Recommended to continue current medication  Diabetes (A1c goal <8%) -Controlled Lab Results  Component Value Date   HGBA1C 7.5 (H) 02/27/2021  -Current medications: Repaglidine 13m46m 1 tablet 3 times daily before meals  Metformin 500m57m1 tablet 3 times daily  -Medications previously tried: n/a  -Current home glucose readings fasting glucose: averaging 110-118, highest he has seen it was 130 -Denies hypoglycemic/hyperglycemic symptoms -Current meal patterns:  breakfast: Eggs, sausage, fruit  lunch: smaller meal, depends on what is available to him, could not specify  dinner: vegetables and meat snacks: oatmeal cookie,  drinks: water or diet cheerwine  -Current exercise: none at this time  -  Educated on A1c and blood sugar goals; Complications of diabetes including kidney damage, retinal damage, and cardiovascular disease; Exercise goal of 150 minutes per week; Benefits of weight loss; Prevention and management of hypoglycemic episodes; Benefits of routine self-monitoring of blood sugar; Carbohydrate counting and/or plate method -Counseled to check feet daily and get yearly eye exams -Counseled on diet and exercise extensively Recommended to continue current medication  Atrial Fibrillation (Goal: prevent stroke and major bleeding) -Controlled -CHADSVASC: at least 5 -Current treatment: Rate control: Amiodarone 200mg  - 1 tablet daily except Sundays  Anticoagulation: Eliquis 5mg  - 1 tablet twice daily  -Medications previously tried: warfarin -Home BP and HR readings:   -Counseled on increased risk of stroke due to Afib and benefits of anticoagulation for stroke prevention; importance of adherence to anticoagulant exactly as prescribed; bleeding risk associated with Eliquis and importance of self-monitoring for signs/symptoms of  bleeding; avoidance of NSAIDs due to increased bleeding risk with anticoagulants; importance of regular laboratory monitoring; seeking medical attention after a head injury or if there is blood in the urine/stool; -Counseled on diet and exercise extensively Recommended for patient to stop use of ibuprofen that he reports taking due to increase risk of bleeding while taking  Shortness of Breath / Dyspnea (Goal: control symptoms and prevent exacerbations) -Not ideally controlled -Current treatment  Tiotropium Bromide-Olodaterol (Stiolto) 2.5-2.63mcg/act - 2 puffs daily  Albuterol 161mcg/act HFA inhaler - 2 puffs every 6 hours as needed  -Medications previously tried: n/a  -Exacerbations requiring treatment in last 6 months: none -Patient denies consistent use of maintenance inhaler -Frequency of rescue inhaler use: does not use -Counseled on Proper inhaler technique; Benefits of consistent maintenance inhaler use When to use rescue inhaler Differences between maintenance and rescue inhalers -Recommended to continue current medication  GERD (Goal: Prevention of acid reflux/heartburn) -Controlled -Current treatment  Pantoprazole 40mg  - 1 tablet daily  -Medications previously tried: omeprazole   -Recommended to continue current medication  Neuropathy (Goal: Pain control) -Controlled -Current treatment  Gabapentin 100mg  - 1 capsule 3 times daily  -Medications previously tried: n/a  -Recommended to continue current medication   Glaucoma (Goal: Control of occular pressure)- follows with Dr. Zadie Rhine  -Controlled -Current treatment  Latanoprost 0.005% solution - 1 drop into both eyes daily - patient reports that he is only using in right eye at this time  -Medications previously tried: n/a  -Recommended to continue current medication  Iron/B12 Deficiency Anemia (Goal: maintenance of appropriate Iron and B12 levels) -Not ideally controlled - B12 and Iron Binding/Ferritin/Folate levels  have not been rechecked since addition of medications  -Current treatment  Vitamin B12 266mcg - 2 tablet daily Ferrous Sulfate 325mg  - 1 tablet twice daily  -Medications previously tried: n/a  -Counseled on diet and exercise extensively Recommended to continue current medication  Health Maintenance -Vaccine gaps: Shingles, Tdap, COVID booster, Influenza vaccines  -Current therapy:  Ibuprofen 600mg  - 1 tablet every 6 hours as needed  Docusate 100mg  - 2 capsules daily  Bisacodyl 5mg  - 1 tablet daily as needed  Vitamin D 1000 units - 2 tablet daily  Propylene Glycol 0.6% solution - 1 drop into both eyes twice daily as needed  -Educated on Cost vs benefit of each product must be carefully weighed by individual consumer Supplements may interfere with prescription drugs -Patient is satisfied with current therapy and denies issues -Recommended for patient to stop ibuprofen and use acetaminophen (up to 2000mg  daily) or volatren gel 1% - applied 4 times daily for pain instead  of oral NSAIDs due to bleeding risk   Patient Goals/Self-Care Activities Patient will:  - take medications as prescribed check glucose 3 times weekly, document, and provide at future appointments check blood pressure 3 times wekly, document, and provide at future appointments engage in dietary modifications by reducing carbohydrate, high cholesterol, and sodium intake  Follow Up Plan: Telephone follow up appointment with care management team member scheduled for: The patient has been provided with contact information for the care management team and has been advised to call with any health related questions or concerns.

## 2021-07-24 ENCOUNTER — Other Ambulatory Visit: Payer: Self-pay

## 2021-07-24 ENCOUNTER — Encounter (INDEPENDENT_AMBULATORY_CARE_PROVIDER_SITE_OTHER): Payer: Self-pay | Admitting: Ophthalmology

## 2021-07-24 ENCOUNTER — Ambulatory Visit (INDEPENDENT_AMBULATORY_CARE_PROVIDER_SITE_OTHER): Payer: Medicare Other | Admitting: Ophthalmology

## 2021-07-24 DIAGNOSIS — H353211 Exudative age-related macular degeneration, right eye, with active choroidal neovascularization: Secondary | ICD-10-CM | POA: Diagnosis not present

## 2021-07-24 MED ORDER — BEVACIZUMAB 2.5 MG/0.1ML IZ SOSY
2.5000 mg | PREFILLED_SYRINGE | INTRAVITREAL | Status: AC | PRN
  Administered 2021-07-24: 2.5 mg via INTRAVITREAL

## 2021-07-24 NOTE — Assessment & Plan Note (Signed)
OD was wet AMD, much less active from onset yet still with massive scarring.

## 2021-07-24 NOTE — Progress Notes (Signed)
07/24/2021     CHIEF COMPLAINT Patient presents for  Chief Complaint  Patient presents with   Retina Follow Up    5 week fu OD and Avastin OD Pt states VA OU stable since last visit. Pt denies FOL, floaters, or ocular pain OU.  A1C: unknown LBS: 130 something (I do not check it all the time) Pt reports that he uses Latanoprost In OD "but only about once a week."       HISTORY OF PRESENT ILLNESS: [name] is a 86 y.o. male who presents to the clinic today for:   HPI     Retina Follow Up   Patient presents with  Wet AMD.  In right eye.  This started 5 weeks ago.  Severity is mild.  Duration of 5 weeks.  Since onset it is stable. Additional comments: 5 week fu OD and Avastin OD Pt states VA OU stable since last visit. Pt denies FOL, floaters, or ocular pain OU.  A1C: unknown LBS: 130 something (I do not check it all the time) Pt reports that he uses Latanoprost In OD "but only about once a week."         Comments   5 week fu od oct avastin od. Pt states "seems like I am seeing a little better."  Pt is using latanoprost OD qhs, pt states he did not use it last night but used it the night before. LBS: last checked 2-3 weeks A1C: 7.5 in April 2022, pt states he goes next week for follow up visit.      Last edited by Laurin Coder on 07/24/2021  9:11 AM.      Referring physician: Cassandria Anger, MD [place],  [place] [address]  HISTORICAL INFORMATION:   Selected notes from the MEDICAL RECORD NUMBER    Lab Results  Component Value Date   HGBA1C 7.5 (H) 02/27/2021     CURRENT MEDICATIONS: Current Outpatient Medications (Ophthalmic Drugs)  Medication Sig   latanoprost (XALATAN) 0.005 % ophthalmic solution Place 1 drop into both eyes daily.   Propylene Glycol 0.6 % SOLN Place 1 drop into the left eye 2 (two) times daily as needed (for dry eyes).   No current facility-administered medications for this visit. (Ophthalmic Drugs)    Current Outpatient Medications (Other)  Medication Sig   Accu-Chek Softclix Lancets lancets Use to check blood sugar daily DX:E11.59   albuterol (VENTOLIN HFA) 108 (90 Base) MCG/ACT inhaler TAKE 2 PUFFS BY MOUTH EVERY 6 HOURS AS NEEDED FOR WHEEZE OR SHORTNESS OF BREATH (Patient not taking: Reported on 07/15/2021)   amiodarone (PACERONE) 200 MG tablet Take 1 tablet (200 mg total) by mouth daily. Except Sunday.   bisacodyl (DULCOLAX) 5 MG EC tablet Take 5 mg by mouth daily as needed for moderate constipation.   Blood Glucose Monitoring Suppl (ACCU-CHEK AVIVA PLUS) w/Device KIT Use to check blood sugar daily DX:E11.59   cholecalciferol (VITAMIN D) 1000 units tablet Take 2,000 Units by mouth daily.   docusate sodium (COLACE) 100 MG capsule Take 200 mg by mouth at bedtime.   ELIQUIS 5 MG TABS tablet TAKE 1 TABLET BY MOUTH TWICE A DAY   ferrous sulfate 325 (65 FE) MG tablet Take 1 tablet (325 mg total) by mouth 2 (two) times daily with a meal.   furosemide (LASIX) 40 MG tablet Take 1 tablet daily alternating with 1.5 tablets every other day (Patient taking differently: Take 80 mg by mouth daily. Take  1 tablet daily alternating with 1.5 tablets every other day)   gabapentin (NEURONTIN) 100 MG capsule Take 1 capsule (100 mg total) by mouth 3 (three) times daily.   glucose blood (ACCU-CHEK AVIVA PLUS) test strip Use to check blood sugar daily DX:E11.59   metFORMIN (GLUCOPHAGE) 500 MG tablet TAKE 1 TABLET BY MOUTH THREE TIMES A DAY   metoprolol tartrate (LOPRESSOR) 50 MG tablet  (Patient not taking: Reported on 07/15/2021)   pantoprazole (PROTONIX) 40 MG tablet TAKE 1 TABLET BY MOUTH EVERY DAY   repaglinide (PRANDIN) 2 MG tablet TAKE 1 TABLET BY MOUTH 3 TIMES DAILY BEFORE MEALS. (Patient taking differently: Take 2 mg by mouth 3 (three) times daily before meals.)   simvastatin (ZOCOR) 10 MG tablet TAKE 1 TABLET BY MOUTH EVERY DAY   Tiotropium Bromide-Olodaterol (STIOLTO RESPIMAT) 2.5-2.5 MCG/ACT AERS  Inhale 2 puffs into the lungs daily. (Patient not taking: Reported on 07/15/2021)   vitamin B-12 (CYANOCOBALAMIN) 250 MCG tablet Take 2 tablets (500 mcg total) by mouth daily.   No current facility-administered medications for this visit. (Other)      REVIEW OF SYSTEMS:    ALLERGIES No Known Allergies  PAST MEDICAL HISTORY Past Medical History:  Diagnosis Date   Acute diastolic CHF (congestive heart failure) ([place]) 08/04/2020   Arthritis    Atrial fibrillation ([place])    post op; amiodarone and coumadin continued for 3 mos post op   CARCINOMA, SKIN, SQUAMOUS CELL 01/07/2010   Cataract    beginning of cataracts   COLONIC POLYPS, HX OF 04/26/2007   Coronary artery disease    s/p CABG 3/12: L-LAD, S-CFX (Dr. Roxy Manns); EF 45% at cath prior to CABG   DIABETES MELLITUS, TYPE II 04/26/2007   Hyperlipidemia    HYPERTENSION 04/26/2007   NEPHROLITHIASIS, HX OF 04/26/2007   SBO (small bowel obstruction) ([place]) 10/2015   Partial    Shortness of breath dyspnea    with exertion   Unspecified hearing loss 12/18/2008   Past Surgical History:  Procedure Laterality Date   BACK SURGERY     disectomy   CARDIOVERSION N/A 06/08/2018   Procedure: CARDIOVERSION;  Surgeon: Buford Dresser, MD;  Location[facility] ENDOSCOPY;  Service: Cardiovascular;  Laterality: N/A;   CARDIOVERSION N/A 07/25/2018   Procedure: CARDIOVERSION;  Surgeon: Fay Records, MD;  Location: [facility];  Service: Cardiovascular;  Laterality: N/A;   COLONOSCOPY     CORONARY ARTERY BYPASS GRAFT  01/12/2011   LUMBAR LAMINECTOMY/DECOMPRESSION MICRODISCECTOMY N/A 05/08/2015   Procedure: LUMBAR LAMINECTOMY/DECOMPRESSION MICRODISCECTOMY 1 LEVEL Lumbar four five;  Surgeon: Melina Schools, MD;  Location: [facility];  Service: Orthopedics;  Laterality: N/A;   TOTAL HIP ARTHROPLASTY Left    left on 04/15/07   TYMPANIC MEMBRANE REPAIR  1981    FAMILY HISTORY Family History  Problem Relation Age of Onset   Heart failure Mother    Heart disease  Father    Cancer Sister    Cancer Brother    Heart disease Brother    Breast cancer Sister    Colon cancer Neg Hx    Esophageal cancer Neg Hx    Rectal cancer Neg Hx    Stomach cancer Neg Hx     SOCIAL HISTORY Social History   Tobacco Use   Smoking status: Former    Packs/day: 2.00    Years: 30.00    Pack years: 60.00    Types: Cigarettes    Quit date: 01/02/1981    Years since quitting: 40.5   Smokeless tobacco:  Never  Vaping Use   Vaping Use: Never used  Substance Use Topics   Alcohol use: No   Drug use: No         OPHTHALMIC EXAM:  Base Eye Exam     Visual Acuity (Snellen - Linear)       Right Left   Dist [place] CF at 3' 20/20 -2   Dist ph [place] NI          Tonometry (Tonopen, 9:14 AM)       Right Left   Pressure 20 18         Pupils       Pupils Dark Light APD   Right PERRL 3 2 None   Left PERRL 3 2 None         Visual Fields       Left Right   Restrictions  Central scotoma         Extraocular Movement       Right Left    Full Full         Neuro/Psych     Oriented x3: Yes   Mood/Affect: Normal         Dilation     Right eye: 1.0% Mydriacyl, 2.5% Phenylephrine @ 9:14 AM           Slit Lamp and Fundus Exam     External Exam       Right Left   External Normal Normal         Slit Lamp Exam       Right Left   Lids/Lashes Normal Normal   Conjunctiva/Sclera White and quiet White and quiet   Cornea Clear Clear   Anterior Chamber Deep and quiet Deep and quiet   Iris Round and reactive Round and reactive   Lens Clear Posterior chamber intraocular lens   Anterior Vitreous Normal Normal         Fundus Exam       Right Left   Posterior Vitreous Normal    Disc Normal    C/D Ratio 0.5    Macula Subretinal hemorrhage, Hemorrhage, with sub-RPE hemorrhage, approximately 3 disc areas in size with adjacent subretinal hemorrhage and other 3 disc areas in size, with no new hemorrhage, and temporal edge of large R PED,  may have a small tear in the RPE    Vessels Normal    Periphery Normal             IMAGING AND PROCEDURES  Imaging and Procedures for 07/24/21  OCT, Retina - OU - Both Eyes       Right Eye Quality was good. Scan locations included subfoveal. Progression has improved. Findings include abnormal foveal contour, intraretinal fluid, subretinal fluid, pigment epithelial detachment.   Left Eye Quality was good. Scan locations included subfoveal. Central Foveal Thickness: 308. Progression has been stable. Findings include abnormal foveal contour, retinal drusen .   Notes Significant hemorrhagic PED OU with subretinal hemorrhage in the.  Subfoveal location.  Wet AMD OD.  Slightly improved pigment epithelial detachment 1 month after  injection Avastin.  We will repeat injection today     Intravitreal Injection, Pharmacologic Agent - OD - Right Eye       Time Out 07/24/2021. 9:52 AM. Confirmed correct patient, procedure, site, and patient consented.   Anesthesia Topical anesthesia was used. Anesthetic medications included Akten 3.5%.   Procedure Preparation included 5% betadine to ocular surface, 10% betadine to eyelids, Ofloxacin , Tobramycin 0.3%. A  30 gauge needle was used.   Injection: 2.5 mg bevacizumab 2.5 MG/0.1ML   Route: Intravitreal, Site: Right Eye   NDC: [phone], Lot: [id number]   Post-op Post injection exam found visual acuity of at least counting fingers. The patient tolerated the procedure well. There were no complications. The patient received written and verbal post procedure care education. Post injection medications were not given.              ASSESSMENT/PLAN:  Exudative age-related macular degeneration of right eye with active choroidal neovascularization (HCC) OD was wet AMD, much less active from onset yet still with massive scarring.     ICD-10-CM   1. Exudative age-related macular degeneration of right eye with active choroidal  neovascularization (HCC)  H35.3211 OCT, Retina - OU - Both Eyes    Intravitreal Injection, Pharmacologic Agent - OD - Right Eye    bevacizumab (AVASTIN) SOSY 2.5 mg      1.  Wet ARMD with large subfoveal vascularized PED and massive hemorrhage at onset of therapy some  2.5 months prior, now postinjection #2 and slightly improving macular anatomy.  No hemorrhage. 2.  Repeat injection Avastin OD today  3.  Ophthalmic Meds Ordered this visit:  Meds ordered this encounter  Medications   bevacizumab (AVASTIN) SOSY 2.5 mg       Return in about 5 weeks (around 08/28/2021) for dilate, OD, AVASTIN OCT.  There are no Patient Instructions on file for this visit.   Explained the diagnoses, plan, and follow up with the patient and they expressed understanding.  Patient expressed understanding of the importance of proper follow up care.   Clent Demark Leeyah Heather M.D. Diseases & Surgery of the Retina and Vitreous Retina & Diabetic [place] 07/24/21     Abbreviations: M myopia (nearsighted); A astigmatism; H hyperopia (farsighted); P presbyopia; Mrx spectacle prescription;  CTL contact lenses; OD right eye; OS left eye; OU both eyes  XT exotropia; ET esotropia; PEK punctate epithelial keratitis; PEE punctate epithelial erosions; DES dry eye syndrome; MGD meibomian gland dysfunction; ATs artificial tears; PFAT's preservative free artificial tears; [place] nuclear sclerotic cataract; PSC posterior subcapsular cataract; ERM epi-retinal membrane; PVD posterior vitreous detachment; RD retinal detachment; DM diabetes mellitus; DR diabetic retinopathy; NPDR non-proliferative diabetic retinopathy; PDR proliferative diabetic retinopathy; CSME clinically significant macular edema; DME diabetic macular edema; dbh dot blot hemorrhages; CWS cotton wool spot; POAG primary open angle glaucoma; C/D cup-to-disc ratio; HVF humphrey visual field; GVF goldmann visual field; OCT optical coherence tomography; IOP intraocular  pressure; BRVO Branch retinal vein occlusion; CRVO central retinal vein occlusion; CRAO central retinal artery occlusion; BRAO branch retinal artery occlusion; RT retinal tear; SB scleral buckle; PPV pars plana vitrectomy; VH Vitreous hemorrhage; PRP panretinal laser photocoagulation; IVK intravitreal kenalog; VMT vitreomacular traction; MH Macular hole;  NVD neovascularization of the disc; NVE neovascularization elsewhere; AREDS age related eye disease study; ARMD age related macular degeneration; POAG primary open angle glaucoma; EBMD epithelial/anterior basement membrane dystrophy; ACIOL anterior chamber intraocular lens; IOL intraocular lens; PCIOL posterior chamber intraocular lens; Phaco/IOL phacoemulsification with intraocular lens placement; [place] photorefractive keratectomy; LASIK laser assisted in situ keratomileusis; HTN hypertension; DM diabetes mellitus; COPD chronic obstructive pulmonary disease

## 2021-07-30 DIAGNOSIS — M5136 Other intervertebral disc degeneration, lumbar region: Secondary | ICD-10-CM | POA: Diagnosis not present

## 2021-07-30 DIAGNOSIS — D6869 Other thrombophilia: Secondary | ICD-10-CM | POA: Diagnosis not present

## 2021-07-30 DIAGNOSIS — I4891 Unspecified atrial fibrillation: Secondary | ICD-10-CM | POA: Diagnosis not present

## 2021-07-30 DIAGNOSIS — M47896 Other spondylosis, lumbar region: Secondary | ICD-10-CM | POA: Diagnosis not present

## 2021-07-30 DIAGNOSIS — G8929 Other chronic pain: Secondary | ICD-10-CM | POA: Diagnosis not present

## 2021-08-21 ENCOUNTER — Ambulatory Visit: Payer: Medicare Other | Admitting: Physician Assistant

## 2021-08-21 ENCOUNTER — Encounter: Payer: Self-pay | Admitting: Physician Assistant

## 2021-08-21 ENCOUNTER — Other Ambulatory Visit: Payer: Self-pay

## 2021-08-21 VITALS — BP 120/72 | HR 72 | Ht 68.0 in | Wt 224.8 lb

## 2021-08-21 DIAGNOSIS — R0602 Shortness of breath: Secondary | ICD-10-CM

## 2021-08-21 DIAGNOSIS — D649 Anemia, unspecified: Secondary | ICD-10-CM | POA: Diagnosis not present

## 2021-08-21 DIAGNOSIS — I2510 Atherosclerotic heart disease of native coronary artery without angina pectoris: Secondary | ICD-10-CM

## 2021-08-21 DIAGNOSIS — I480 Paroxysmal atrial fibrillation: Secondary | ICD-10-CM

## 2021-08-21 DIAGNOSIS — I5032 Chronic diastolic (congestive) heart failure: Secondary | ICD-10-CM | POA: Diagnosis not present

## 2021-08-21 DIAGNOSIS — I10 Essential (primary) hypertension: Secondary | ICD-10-CM | POA: Diagnosis not present

## 2021-08-21 NOTE — Progress Notes (Signed)
Cardiology Office Note:    Date:  08/21/2021   ID:  [name], DOB July 18, 1935, MRN [id number]  PCP:  Cassandria Anger, MD  [facility] Cardiologist:  [name], MD  [place] Electrophysiologist:  None   Chief Complaint: 9 months follow up   History of Present Illness:    [name] is a 86 y.o. male with a hx of CAD s/p CABG 01/2011 (LIMA to LAD; SVG to Cx),diabetes, hypertension, nephrolithiasis, hyperlipidemia, mild to moderate MR, diastolic heart failure, IDA and  PAF seen for follow up.    The pt had near syncope in June 2020 . Monitor showed no arrhythmias.  Symptoms improved/resolved.  Stress test 03/2021 was high risk study with worse inferior defect compared to 2019 study. Per Dr. Harrington Challenger "Pt is currently anemic  (Hgb 9) with Fe deficient He does have some tightnesss, SOB with activity    Anemia may be exacerbating even if no change in anatomy      I told the pt that would consider heart catheterization potentially but only when anemia clarified Would recomm echo to redefine LVEF"  Echo 03/2021 showed LVEF of 60-65%, intermediate diastolic, mild to moderate MR.   Last CBC 04/23/21 showed improved Hgb to 11.5.   Here today for follow up.  He reports chronic shortness of breath all the time.  No exercise or activity regularly.  Mostly sedentary lifestyle.  He denies chest pain, dizziness, palpitation, PND, lower extremity edema or melena.  Intermittent orthopnea.  Does not weigh himself.  Watch his salt in his diet.  Past Medical History:  Diagnosis Date   Acute diastolic CHF (congestive heart failure) ([place]) 08/04/2020   Arthritis    Atrial fibrillation ([place])    post op; amiodarone and coumadin continued for 3 mos post op   CARCINOMA, SKIN, SQUAMOUS CELL 01/07/2010   Cataract    beginning of cataracts   COLONIC POLYPS, HX OF 04/26/2007   Coronary artery disease    s/p CABG 3/12: L-LAD, S-CFX (Dr. Roxy Manns); EF 45% at cath prior to CABG   DIABETES  MELLITUS, TYPE II 04/26/2007   Hyperlipidemia    HYPERTENSION 04/26/2007   NEPHROLITHIASIS, HX OF 04/26/2007   SBO (small bowel obstruction) ([place]) 10/2015   Partial    Shortness of breath dyspnea    with exertion   Unspecified hearing loss 12/18/2008    Past Surgical History:  Procedure Laterality Date   BACK SURGERY     disectomy   CARDIOVERSION N/A 06/08/2018   Procedure: CARDIOVERSION;  Surgeon: Buford Dresser, MD;  Location: [facility] ENDOSCOPY;  Service: Cardiovascular;  Laterality: N/A;   CARDIOVERSION N/A 07/25/2018   Procedure: CARDIOVERSION;  Surgeon: Fay Records, MD;  Location: [place];  Service: Cardiovascular;  Laterality: N/A;   COLONOSCOPY     CORONARY ARTERY BYPASS GRAFT  01/12/2011   LUMBAR LAMINECTOMY/DECOMPRESSION MICRODISCECTOMY N/A 05/08/2015   Procedure: LUMBAR LAMINECTOMY/DECOMPRESSION MICRODISCECTOMY 1 LEVEL Lumbar four five;  Surgeon: Melina Schools, MD;  Location: [place];  Service: Orthopedics;  Laterality: N/A;   TOTAL HIP ARTHROPLASTY Left    left on 04/15/07   TYMPANIC MEMBRANE REPAIR  1981    Current Medications: Current Meds  Medication Sig   Accu-Chek Softclix Lancets lancets Use to check blood sugar daily DX:E11.59   albuterol (VENTOLIN HFA) 108 (90 Base) MCG/ACT inhaler TAKE 2 PUFFS BY MOUTH EVERY 6 HOURS AS NEEDED FOR WHEEZE OR SHORTNESS OF BREATH   amiodarone (PACERONE) 200 MG tablet Take 1 tablet (200  mg total) by mouth daily. Except Sunday.   bisacodyl (DULCOLAX) 5 MG EC tablet Take 5 mg by mouth daily as needed for moderate constipation.   Blood Glucose Monitoring Suppl (ACCU-CHEK AVIVA PLUS) w/Device KIT Use to check blood sugar daily DX:E11.59   cholecalciferol (VITAMIN D) 1000 units tablet Take 2,000 Units by mouth daily.   docusate sodium (COLACE) 100 MG capsule Take 200 mg by mouth at bedtime.   ELIQUIS 5 MG TABS tablet TAKE 1 TABLET BY MOUTH TWICE A DAY   ferrous sulfate 325 (65 FE) MG tablet Take 325 mg by mouth daily with breakfast.    furosemide (LASIX) 40 MG tablet Take 80 mg by mouth.   gabapentin (NEURONTIN) 100 MG capsule Take 1 capsule (100 mg total) by mouth 3 (three) times daily.   glucose blood (ACCU-CHEK AVIVA PLUS) test strip Use to check blood sugar daily DX:E11.59   latanoprost (XALATAN) 0.005 % ophthalmic solution Place 1 drop into both eyes daily.   metFORMIN (GLUCOPHAGE) 500 MG tablet TAKE 1 TABLET BY MOUTH THREE TIMES A DAY   metoprolol tartrate (LOPRESSOR) 50 MG tablet    pantoprazole (PROTONIX) 40 MG tablet TAKE 1 TABLET BY MOUTH EVERY DAY   Propylene Glycol 0.6 % SOLN Place 1 drop into the left eye 2 (two) times daily as needed (for dry eyes).   repaglinide (PRANDIN) 2 MG tablet TAKE 1 TABLET BY MOUTH 3 TIMES DAILY BEFORE MEALS. (Patient taking differently: Take 2 mg by mouth 3 (three) times daily before meals.)   simvastatin (ZOCOR) 10 MG tablet TAKE 1 TABLET BY MOUTH EVERY DAY   vitamin B-12 (CYANOCOBALAMIN) 250 MCG tablet Take 2 tablets (500 mcg total) by mouth daily.   [DISCONTINUED] furosemide (LASIX) 40 MG tablet Take 1 tablet daily alternating with 1.5 tablets every other day (Patient taking differently: Take 80 mg by mouth daily. Take 1 tablet daily alternating with 1.5 tablets every other day)     Allergies:   Patient has no known allergies.   Social History   Socioeconomic History   Marital status: Married    Spouse name: Not on file   Number of children: Not on file   Years of education: Not on file   Highest education level: Not on file  Occupational History   Not on file  Tobacco Use   Smoking status: Former    Packs/day: 2.00    Years: 30.00    Pack years: 60.00    Types: Cigarettes    Quit date: 01/02/1981    Years since quitting: 40.6   Smokeless tobacco: Never  Vaping Use   Vaping Use: Never used  Substance and Sexual Activity   Alcohol use: No   Drug use: No   Sexual activity: Not on file  Other Topics Concern   Not on file  Social History Narrative   Not on file    Social Determinants of Health   Financial Resource Strain: Low Risk    Difficulty of Paying Living Expenses: Not hard at all  Food Insecurity: No Food Insecurity   Worried About Charity fundraiser in the Last Year: Never true   [place] in the Last Year: Never true  Transportation Needs: No Transportation Needs   Lack of Transportation (Medical): No   Lack of Transportation (Non-Medical): No  Physical Activity: Inactive   Days of Exercise per Week: 0 days   Minutes of Exercise per Session: 0 min  Stress: No Stress Concern Present  Feeling of Stress : Not at all  Social Connections: Socially Integrated   Frequency of Communication with Friends and Family: More than three times a week   Frequency of Social Gatherings with Friends and Family: Once a week   Attends Religious Services: More than 4 times per year   Active Member of [employer] or Organizations: No   Attends Music therapist: More than 4 times per year   Marital Status: Married     Family History: The patient's family history includes Breast cancer in his sister; Cancer in his brother and sister; Heart disease in his brother and father; Heart failure in his mother. There is no history of Colon cancer, Esophageal cancer, Rectal cancer, or Stomach cancer.    ROS:   Please see the history of present illness.    All other systems reviewed and are negative.   EKGs/Labs/Other Studies Reviewed:    The following studies were reviewed today:  Stress test 03/2021 Nuclear stress EF: 40%. The left ventricular ejection fraction is moderately decreased (30-44%). There was no ST segment deviation noted during stress. There is a small defect of severe severity present in the basal inferior, mid inferior and apical inferior location. The defect is partially reversible and is consistent with inferior infarct with peri infarct ischemia. This is a high risk study. Compared to study of 2019, there is a now an inferior  defect worse on stress imaging and LVF appears down with focal inferoseptal and inferior wall motion abnormalities Consider 2D echo  Echo 04/07/21 1. E/LatE' elevated but in setting of severe MAC diastolic parameters  indeterminate . Left ventricular ejection fraction, by estimation, is 60  to 65%. The left ventricle has normal function. The left ventricle has no  regional wall motion abnormalities.  Left ventricular diastolic parameters are indeterminate.   2. Right ventricular systolic function is normal. The right ventricular  size is normal.   3. Left atrial size was moderately dilated.   4. Right atrial size was mildly dilated.   5. The mitral valve is degenerative. Mild to moderate mitral valve  regurgitation. No evidence of mitral stenosis. Severe mitral annular  calcification.   6. The aortic valve is tricuspid. There is mild calcification of the  aortic valve. Aortic valve regurgitation is not visualized. Mild aortic  valve sclerosis is present, with no evidence of aortic valve stenosis.   7. The inferior vena cava is normal in size with greater than 50%  respiratory variability, suggesting right atrial pressure of 3 mmHg.   EKG:  EKG is not  ordered today.   Recent Labs: 02/27/2021: ALT 14 03/25/2021: NT-Pro BNP 742 04/07/2021: BUN 22; Creatinine, Ser 1.37; Potassium 3.9; Sodium 137 04/23/2021: Hemoglobin 11.5; Platelets 294  Recent Lipid Panel    Component Value Date/Time   CHOL 126 08/09/2020 1356   TRIG 149 08/09/2020 1356   TRIG 78 11/05/2006 1123   HDL 30 (L) 08/09/2020 1356   CHOLHDL 4.2 08/09/2020 1356   CHOLHDL 2.5 01/17/2016 1209   VLDL 20 01/17/2016 1209   LDLCALC 70 08/09/2020 1356   Physical Exam:    VS:  BP 120/72   Pulse 72   Ht _0  (1.727 m)   Wt 224 lb 12.8 oz (102 kg)   SpO2 94%   BMI 34.18 kg/m     Wt Readings from Last 3 Encounters:  08/21/21 224 lb 12.8 oz (102 kg)  05/29/21 227 lb 3.2 oz (103.1 kg)  05/29/21 227 lb 3.2 oz (  103.1 kg)      GEN:  Well nourished, well developed in no acute distress HEENT: Normal NECK: No JVD; No carotid bruits LYMPHATICS: No lymphadenopathy CARDIAC: RRR, no murmurs, rubs, gallops RESPIRATORY:  Clear to auscultation without rales, wheezing or rhonchi  ABDOMEN: Soft, non-tender, non-distended MUSCULOSKELETAL:  No edema; No deformity  SKIN: Warm and dry NEUROLOGIC:  Alert and oriented x 3 PSYCHIATRIC:  Normal affect   ASSESSMENT AND PLAN:    CAD s/p CABG - Stress test 03/2021 was high risk study with worse inferior defect compared to 2019 study. Echo showed preserved LVEF. Cath deferred previously in 03/2021 due to hgb of 9 2nd to iron deficiency. Hgb improved to 11.5 on 6/22. -Patient has chronic dyspnea with and without activity.  This seems overall stable for months.  No chest tightness or pressure. -Would continue medical therapy.  Recommended to call us if worsening symptoms. -Not on aspirin due to need of anticoagulation -Continue beta-blocker and statin  2. HTN -Blood pressure stable and controlled on current medication  3. HLD - No results found for requested labs within last 8760 hours.  -Continue statin  4. DM -Per PCP  5. PAF -Sinus rhythm on exam.  Continue Eliquis for anticoagulation.  No bleeding issue. -Continue metoprolol  6. Chronic diastolic CHF -Lost 4 pounds since last office visit.  He has intermittent orthopnea.  Continue low-sodium diet. -Continue Lasix 80 mg daily -Continue BB  Medication Adjustments/Labs and Tests Ordered: Current medicines are reviewed at length with the patient today.  Concerns regarding medicines are outlined above.  No orders of the defined types were placed in this encounter.  No orders of the defined types were placed in this encounter.   Patient Instructions  Medication Instructions:  Your physician recommends that you continue on your current medications as directed. Please refer to the Current Medication list given to you  today.  *If you need a refill on your cardiac medications before your next appointment, please call your pharmacy*   Lab Work: None ordered  If you have labs (blood work) drawn today and your tests are completely normal, you will receive your results only by: [place] (if you have MyChart) OR A paper copy in the mail If you have any lab test that is abnormal or we need to change your treatment, we will call you to review the results.   Testing/Procedures: None ordered   Follow-Up: At [facility], you and your health needs are our priority.  As part of our continuing mission to provide you with exceptional heart care, we have created designated Provider Care Teams.  These Care Teams include your primary Cardiologist (physician) and Advanced Practice Providers (APPs -  Physician Assistants and Nurse Practitioners) who all work together to provide you with the care you need, when you need it.  We recommend signing up for the patient portal called "MyChart".  Sign up information is provided on this After Visit Summary.  MyChart is used to connect with patients for Virtual Visits (Telemedicine).  Patients are able to view lab/test results, encounter notes, upcoming appointments, etc.  Non-urgent messages can be sent to your provider as well.   To learn more about what you can do with MyChart, go to [email or web address].    Your next appointment:   12 month(s)  The format for your next appointment:   In Person  Provider:   You may see [name], MD or one of the following Advanced Practice Providers on your designated  Care Team:   Richardson Dopp, PA-C Robbie Lis, PA-C   Other Instructions    Signed, Leanor Kail, [place]  08/21/2021 12:06 PM    [facility]

## 2021-08-21 NOTE — Patient Instructions (Signed)
Medication Instructions:  Your physician recommends that you continue on your current medications as directed. Please refer to the Current Medication list given to you today.  *If you need a refill on your cardiac medications before your next appointment, please call your pharmacy*   Lab Work: None ordered  If you have labs (blood work) drawn today and your tests are completely normal, you will receive your results only by: . MyChart Message (if you have MyChart) OR . A paper copy in the mail If you have any lab test that is abnormal or we need to change your treatment, we will call you to review the results.   Testing/Procedures: None ordered   Follow-Up: At [facility], you and your health needs are our priority.  As part of our continuing mission to provide you with exceptional heart care, we have created designated Provider Care Teams.  These Care Teams include your primary Cardiologist (physician) and Advanced Practice Providers (APPs -  Physician Assistants and Nurse Practitioners) who all work together to provide you with the care you need, when you need it.  We recommend signing up for the patient portal called "MyChart".  Sign up information is provided on this After Visit Summary.  MyChart is used to connect with patients for Virtual Visits (Telemedicine).  Patients are able to view lab/test results, encounter notes, upcoming appointments, etc.  Non-urgent messages can be sent to your provider as well.   To learn more about what you can do with MyChart, go to [email or web address].    Your next appointment:   12 month(s)  The format for your next appointment:   In Person  Provider:   You may see Paula Ross, MD or one of the following Advanced Practice Providers on your designated Care Team:    Scott Weaver, PA-C  Vin Bhagat, PA-C    Other Instructions   

## 2021-08-27 ENCOUNTER — Encounter (INDEPENDENT_AMBULATORY_CARE_PROVIDER_SITE_OTHER): Payer: Self-pay | Admitting: Ophthalmology

## 2021-08-27 ENCOUNTER — Other Ambulatory Visit: Payer: Self-pay

## 2021-08-27 ENCOUNTER — Ambulatory Visit (INDEPENDENT_AMBULATORY_CARE_PROVIDER_SITE_OTHER): Payer: Medicare Other | Admitting: Ophthalmology

## 2021-08-27 DIAGNOSIS — H353211 Exudative age-related macular degeneration, right eye, with active choroidal neovascularization: Secondary | ICD-10-CM | POA: Diagnosis not present

## 2021-08-27 MED ORDER — BEVACIZUMAB 2.5 MG/0.1ML IZ SOSY
2.5000 mg | PREFILLED_SYRINGE | INTRAVITREAL | Status: AC | PRN
  Administered 2021-08-27: 2.5 mg via INTRAVITREAL

## 2021-08-27 NOTE — Assessment & Plan Note (Signed)
Vastly improved macular anatomy as compared to onset of examination and therapy May 12, 2021.  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  We will repeat injection today and examination next in 5 weeks

## 2021-08-27 NOTE — Progress Notes (Signed)
08/27/2021     CHIEF COMPLAINT Patient presents for  Chief Complaint  Patient presents with   Retina Follow Up    5 week fu OD and Avastin OD Pt states VA OU stable since last visit. Pt denies FOL, floaters, or ocular pain OU.  A1C: unknown LBS: 130 something (I do not check it all the time) Pt reports that he uses Latanoprost In OD "but only about once a week."       HISTORY OF PRESENT ILLNESS: [name] is a 86 y.o. male who presents to the clinic today for:   HPI     Retina Follow Up   Patient presents with  Wet AMD.  In right eye.  This started 5 weeks ago.  Severity is mild.  Duration of 5 weeks.  Since onset it is stable. Additional comments: 5 week fu OD and Avastin OD Pt states VA OU stable since last visit. Pt denies FOL, floaters, or ocular pain OU.  A1C: unknown LBS: 130 something (I do not check it all the time) Pt reports that he uses Latanoprost In OD "but only about once a week."         Comments   5 week fu OD oct avastin OD. Patient states vision is stable and unchanged since last visit. Denies any new floaters or FOL. Pt states he uses latanoprost qhs OD.       Last edited by Laurin Coder on 08/27/2021  8:47 AM.      Referring physician: Hortencia Pilar, MD [place],  [place] [address]  HISTORICAL INFORMATION:   Selected notes from the MEDICAL RECORD NUMBER    Lab Results  Component Value Date   HGBA1C 7.5 (H) 02/27/2021     CURRENT MEDICATIONS: Current Outpatient Medications (Ophthalmic Drugs)  Medication Sig   latanoprost (XALATAN) 0.005 % ophthalmic solution Place 1 drop into both eyes daily.   Propylene Glycol 0.6 % SOLN Place 1 drop into the left eye 2 (two) times daily as needed (for dry eyes).   No current facility-administered medications for this visit. (Ophthalmic Drugs)   Current Outpatient Medications (Other)  Medication Sig   Accu-Chek Softclix Lancets lancets Use to check  blood sugar daily DX:E11.59   albuterol (VENTOLIN HFA) 108 (90 Base) MCG/ACT inhaler TAKE 2 PUFFS BY MOUTH EVERY 6 HOURS AS NEEDED FOR WHEEZE OR SHORTNESS OF BREATH   amiodarone (PACERONE) 200 MG tablet Take 1 tablet (200 mg total) by mouth daily. Except Sunday.   bisacodyl (DULCOLAX) 5 MG EC tablet Take 5 mg by mouth daily as needed for moderate constipation.   Blood Glucose Monitoring Suppl (ACCU-CHEK AVIVA PLUS) w/Device KIT Use to check blood sugar daily DX:E11.59   cholecalciferol (VITAMIN D) 1000 units tablet Take 2,000 Units by mouth daily.   docusate sodium (COLACE) 100 MG capsule Take 200 mg by mouth at bedtime.   ELIQUIS 5 MG TABS tablet TAKE 1 TABLET BY MOUTH TWICE A DAY   ferrous sulfate 325 (65 FE) MG tablet Take 1 tablet (325 mg total) by mouth 2 (two) times daily with a meal. (Patient not taking: Reported on 08/21/2021)   ferrous sulfate 325 (65 FE) MG tablet Take 325 mg by mouth daily with breakfast.   furosemide (LASIX) 40 MG tablet Take 80 mg by mouth.   gabapentin (NEURONTIN) 100 MG capsule Take 1 capsule (100 mg total) by mouth 3 (three) times daily.   glucose blood (ACCU-CHEK AVIVA PLUS)  test strip Use to check blood sugar daily DX:E11.59   metFORMIN (GLUCOPHAGE) 500 MG tablet TAKE 1 TABLET BY MOUTH THREE TIMES A DAY   metoprolol tartrate (LOPRESSOR) 50 MG tablet    pantoprazole (PROTONIX) 40 MG tablet TAKE 1 TABLET BY MOUTH EVERY DAY   repaglinide (PRANDIN) 2 MG tablet TAKE 1 TABLET BY MOUTH 3 TIMES DAILY BEFORE MEALS. (Patient taking differently: Take 2 mg by mouth 3 (three) times daily before meals.)   simvastatin (ZOCOR) 10 MG tablet TAKE 1 TABLET BY MOUTH EVERY DAY   Tiotropium Bromide-Olodaterol (STIOLTO RESPIMAT) 2.5-2.5 MCG/ACT AERS Inhale 2 puffs into the lungs daily. (Patient not taking: Reported on 08/21/2021)   vitamin B-12 (CYANOCOBALAMIN) 250 MCG tablet Take 2 tablets (500 mcg total) by mouth daily.   No current facility-administered medications for this visit.  (Other)      REVIEW OF SYSTEMS: ROS   Negative for: Constitutional, Gastrointestinal, Neurological, Skin, Genitourinary, Musculoskeletal, HENT, Endocrine, Cardiovascular, Eyes, Respiratory, Psychiatric, Allergic/Imm, Heme/Lymph Last edited by Hurman Horn, MD on 08/27/2021  9:50 AM.       ALLERGIES No Known Allergies  PAST MEDICAL HISTORY Past Medical History:  Diagnosis Date   Acute diastolic CHF (congestive heart failure) ([place]) 08/04/2020   Arthritis    Atrial fibrillation ([place])    post op; amiodarone and coumadin continued for 3 mos post op   CARCINOMA, SKIN, SQUAMOUS CELL 01/07/2010   Cataract    beginning of cataracts   COLONIC POLYPS, HX OF 04/26/2007   Coronary artery disease    s/p CABG 3/12: L-LAD, S-CFX (Dr. Roxy Manns); EF 45% at cath prior to CABG   DIABETES MELLITUS, TYPE II 04/26/2007   Hyperlipidemia    HYPERTENSION 04/26/2007   NEPHROLITHIASIS, HX OF 04/26/2007   SBO (small bowel obstruction) ([place]) 10/2015   Partial    Shortness of breath dyspnea    with exertion   Unspecified hearing loss 12/18/2008   Past Surgical History:  Procedure Laterality Date   BACK SURGERY     disectomy   CARDIOVERSION N/A 06/08/2018   Procedure: CARDIOVERSION;  Surgeon: Buford Dresser, MD;  Location: [facility];  Service: Cardiovascular;  Laterality: N/A;   CARDIOVERSION N/A 07/25/2018   Procedure: CARDIOVERSION;  Surgeon: Fay Records, MD;  Location: [facility];  Service: Cardiovascular;  Laterality: N/A;   COLONOSCOPY     CORONARY ARTERY BYPASS GRAFT  01/12/2011   LUMBAR LAMINECTOMY/DECOMPRESSION MICRODISCECTOMY N/A 05/08/2015   Procedure: LUMBAR LAMINECTOMY/DECOMPRESSION MICRODISCECTOMY 1 LEVEL Lumbar four five;  Surgeon: Melina Schools, MD;  Location: [facility];  Service: Orthopedics;  Laterality: N/A;   TOTAL HIP ARTHROPLASTY Left    left on 04/15/07   TYMPANIC MEMBRANE REPAIR  1981    FAMILY HISTORY Family History  Problem Relation Age of Onset   Heart failure Mother     Heart disease Father    Cancer Sister    Cancer Brother    Heart disease Brother    Breast cancer Sister    Colon cancer Neg Hx    Esophageal cancer Neg Hx    Rectal cancer Neg Hx    Stomach cancer Neg Hx     SOCIAL HISTORY Social History   Tobacco Use   Smoking status: Former    Packs/day: 2.00    Years: 30.00    Pack years: 60.00    Types: Cigarettes    Quit date: 01/02/1981    Years since quitting: 40.6   Smokeless tobacco: Never  Vaping Use   Vaping Use:  Never used  Substance Use Topics   Alcohol use: No   Drug use: No         OPHTHALMIC EXAM:  Base Eye Exam     Visual Acuity (Snellen - Linear)       Right Left   Dist cc CF at 3' 20/20 -1   Dist ph cc NI          Tonometry (Tonopen, 8:49 AM)       Right Left   Pressure 15 17         Pupils       Pupils Dark Light Shape React APD   Right PERRL 2.5 2 Round Minimal None   Left PERRL 2.5 2 Round Minimal None         Extraocular Movement       Right Left    Full Full         Neuro/Psych     Oriented x3: Yes   Mood/Affect: Normal         Dilation     Right eye: 1.0% Mydriacyl, 2.5% Phenylephrine @ 8:49 AM           Slit Lamp and Fundus Exam     External Exam       Right Left   External Normal Normal         Slit Lamp Exam       Right Left   Lids/Lashes Normal Normal   Conjunctiva/Sclera White and quiet White and quiet   Cornea Clear Clear   Anterior Chamber Deep and quiet Deep and quiet   Iris Round and reactive Round and reactive   Lens Clear Posterior chamber intraocular lens   Anterior Vitreous Normal Normal         Fundus Exam       Right Left   Posterior Vitreous Normal    Disc Normal    C/D Ratio 0.5    Macula Subretinal hemorrhage, Hemorrhage, with sub-RPE hemorrhage, approximately 3 disc areas in size with adjacent subretinal hemorrhage and other 3 disc areas in size, with no new hemorrhage, and now some evidence of darkening of the  hemorrhage thus resolution and temporal edge of large R PED, may have a small tear in the RPE    Vessels Normal    Periphery Normal             IMAGING AND PROCEDURES  Imaging and Procedures for 08/27/21  OCT, Retina - OU - Both Eyes       Right Eye Quality was good. Scan locations included subfoveal. Central Foveal Thickness: 516. Progression has improved. Findings include abnormal foveal contour, intraretinal fluid, subretinal fluid, pigment epithelial detachment.   Left Eye Quality was good. Scan locations included subfoveal. Central Foveal Thickness: 307. Progression has been stable. Findings include abnormal foveal contour, retinal drusen .   Notes Significant hemorrhagic PED OU with subretinal hemorrhage in the.  Subfoveal location.  Wet AMD OD.  Slightly improved pigment epithelial detachment 1 month after  injection Avastin.  We will repeat injection today, and improved overall since onset of therapy June 2022     Intravitreal Injection, Pharmacologic Agent - OD - Right Eye       Time Out 08/27/2021. 9:47 AM. Confirmed correct patient, procedure, site, and patient consented.   Anesthesia Topical anesthesia was used. Anesthetic medications included [place] 3.5%.   Procedure Preparation included 5% betadine to ocular surface, 10% betadine to eyelids, Ofloxacin , Tobramycin 0.3%. A 30 gauge  needle was used.   Injection: 2.5 mg bevacizumab 2.5 MG/0.1ML   Route: Intravitreal, Site: Right Eye   NDC: [phone], Lot: [id number]   Post-op Post injection exam found visual acuity of at least counting fingers. The patient tolerated the procedure well. There were no complications. The patient received written and verbal post procedure care education. Post injection medications included ocuflox.              ASSESSMENT/PLAN:  Exudative age-related macular degeneration of right eye with active choroidal neovascularization ([place]) Vastly improved macular anatomy as  compared to onset of examination and therapy May 12, 2021.  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  We will repeat injection today and examination next in 5 weeks     ICD-10-CM   1. Exudative age-related macular degeneration of right eye with active choroidal neovascularization (HCC)  H35.3211 OCT, Retina - OU - Both Eyes    Intravitreal Injection, Pharmacologic Agent - OD - Right Eye    bevacizumab (AVASTIN) SOSY 2.5 mg      1.  OD, at 5-week follow-up.  We will repeat injection Avastin today.  Visual acuity stable if not slightly improved.  Macular anatomy also improving.  2.  3.  Ophthalmic Meds Ordered this visit:  Meds ordered this encounter  Medications   bevacizumab (AVASTIN) SOSY 2.5 mg       Return in about 5 weeks (around 10/01/2021) for dilate, OD, AVASTIN OCT.  There are no Patient Instructions on file for this visit.   Explained the diagnoses, plan, and follow up with the patient and they expressed understanding.  Patient expressed understanding of the importance of proper follow up care.   Clent Demark Marlean Mortell M.D. Diseases & Surgery of the Retina and Vitreous [facility] 08/27/21     Abbreviations: M myopia (nearsighted); A astigmatism; H hyperopia (farsighted); P presbyopia; Mrx spectacle prescription;  CTL contact lenses; OD right eye; OS left eye; OU both eyes  XT exotropia; ET esotropia; PEK punctate epithelial keratitis; PEE punctate epithelial erosions; DES dry eye syndrome; MGD meibomian gland dysfunction; ATs artificial tears; PFAT's preservative free artificial tears; [place] nuclear sclerotic cataract; PSC posterior subcapsular cataract; ERM epi-retinal membrane; PVD posterior vitreous detachment; RD retinal detachment; DM diabetes mellitus; DR diabetic retinopathy; NPDR  non-proliferative diabetic retinopathy; PDR proliferative diabetic retinopathy; CSME clinically significant macular edema; DME diabetic macular edema; dbh dot blot hemorrhages; CWS cotton wool spot; POAG primary open angle glaucoma; C/D cup-to-disc ratio; HVF humphrey visual field; GVF goldmann visual field; OCT optical coherence tomography; IOP intraocular pressure; BRVO Branch retinal vein occlusion; CRVO central retinal vein occlusion; CRAO central retinal artery occlusion; BRAO branch retinal artery occlusion; RT retinal tear; SB scleral buckle; PPV pars plana vitrectomy; VH Vitreous hemorrhage; PRP panretinal laser photocoagulation; IVK intravitreal kenalog; VMT vitreomacular traction; MH Macular hole;  NVD neovascularization of the disc; NVE neovascularization elsewhere; AREDS age related eye disease study; ARMD age related macular degeneration; POAG primary open angle glaucoma; EBMD epithelial/anterior basement membrane dystrophy; ACIOL anterior chamber intraocular lens; IOL intraocular lens; PCIOL posterior chamber intraocular lens; Phaco/IOL phacoemulsification with intraocular lens placement; [place] photorefractive keratectomy; LASIK laser assisted in situ keratomileusis; HTN hypertension; DM diabetes mellitus; COPD chronic obstructive pulmonary disease

## 2021-08-28 ENCOUNTER — Encounter (INDEPENDENT_AMBULATORY_CARE_PROVIDER_SITE_OTHER): Payer: Medicare Other | Admitting: Ophthalmology

## 2021-09-01 ENCOUNTER — Other Ambulatory Visit: Payer: Self-pay

## 2021-09-01 ENCOUNTER — Encounter: Payer: Self-pay | Admitting: Internal Medicine

## 2021-09-01 ENCOUNTER — Ambulatory Visit (INDEPENDENT_AMBULATORY_CARE_PROVIDER_SITE_OTHER): Payer: Medicare Other | Admitting: Internal Medicine

## 2021-09-01 VITALS — BP 140/72 | HR 69 | Temp 97.7°F | Ht 68.0 in | Wt 226.0 lb

## 2021-09-01 DIAGNOSIS — I2510 Atherosclerotic heart disease of native coronary artery without angina pectoris: Secondary | ICD-10-CM | POA: Diagnosis not present

## 2021-09-01 DIAGNOSIS — E1159 Type 2 diabetes mellitus with other circulatory complications: Secondary | ICD-10-CM

## 2021-09-01 DIAGNOSIS — G8929 Other chronic pain: Secondary | ICD-10-CM

## 2021-09-01 DIAGNOSIS — I480 Paroxysmal atrial fibrillation: Secondary | ICD-10-CM

## 2021-09-01 DIAGNOSIS — Z23 Encounter for immunization: Secondary | ICD-10-CM

## 2021-09-01 DIAGNOSIS — M545 Low back pain, unspecified: Secondary | ICD-10-CM

## 2021-09-01 DIAGNOSIS — M47816 Spondylosis without myelopathy or radiculopathy, lumbar region: Secondary | ICD-10-CM | POA: Diagnosis not present

## 2021-09-01 DIAGNOSIS — E538 Deficiency of other specified B group vitamins: Secondary | ICD-10-CM | POA: Diagnosis not present

## 2021-09-01 LAB — COMPREHENSIVE METABOLIC PANEL
ALT: 22 U/L (ref 0–53)
AST: 25 U/L (ref 0–37)
Albumin: 4.2 g/dL (ref 3.5–5.2)
Alkaline Phosphatase: 50 U/L (ref 39–117)
BUN: 18 mg/dL (ref 6–23)
CO2: 28 mEq/L (ref 19–32)
Calcium: 9.1 mg/dL (ref 8.4–10.5)
Chloride: 102 mEq/L (ref 96–112)
Creatinine, Ser: 1.29 mg/dL (ref 0.40–1.50)
GFR: 50.88 mL/min — ABNORMAL LOW (ref >60.00)
Glucose, Bld: 115 mg/dL — ABNORMAL HIGH (ref 70–99)
Potassium: 3.4 mEq/L — ABNORMAL LOW (ref 3.5–5.1)
Sodium: 141 mEq/L (ref 135–145)
Total Bilirubin: 0.7 mg/dL (ref 0.2–1.2)
Total Protein: 7.1 g/dL (ref 6.0–8.3)

## 2021-09-01 LAB — VITAMIN B12: Vitamin B-12: 712 pg/mL (ref 211–911)

## 2021-09-01 LAB — HEMOGLOBIN A1C: Hgb A1c MFr Bld: 6.9 % — ABNORMAL HIGH (ref 4.6–6.5)

## 2021-09-01 NOTE — Assessment & Plan Note (Signed)
On B12 Check B12 

## 2021-09-01 NOTE — Assessment & Plan Note (Signed)
Dr Nelva Bush injected back this am

## 2021-09-01 NOTE — Assessment & Plan Note (Addendum)
No angina Cont w/Eliquis, Amlodipine, Lopressor, Zocor

## 2021-09-01 NOTE — Assessment & Plan Note (Signed)
Cont w/Eliquis 

## 2021-09-01 NOTE — Assessment & Plan Note (Signed)
Cont on Metformin, Prandin Check A1c 

## 2021-09-01 NOTE — Addendum Note (Signed)
Addended by: Boris Lown B on: 09/01/2021 02:47 PM   Modules accepted: Orders

## 2021-09-01 NOTE — Progress Notes (Signed)
Subjective:  Patient ID: [name], male    DOB: [birth year]  Age: 86 y.o. MRN: [id number]  CC: Follow-up (3 month f/u)   HPI [name] presents for DM, HTN, CHF. Pt had an injection in the back He is here w/dtr Tye [place]  Outpatient Medications Prior to Visit  Medication Sig Dispense Refill   Accu-Chek Softclix Lancets lancets Use to check blood sugar daily DX:E11.59 100 each 3   albuterol (VENTOLIN HFA) 108 (90 Base) MCG/ACT inhaler TAKE 2 PUFFS BY MOUTH EVERY 6 HOURS AS NEEDED FOR WHEEZE OR SHORTNESS OF BREATH 6.7 each 2   amiodarone (PACERONE) 200 MG tablet Take 1 tablet (200 mg total) by mouth daily. Except Sunday. 90 tablet 3   bisacodyl (DULCOLAX) 5 MG EC tablet Take 5 mg by mouth daily as needed for moderate constipation.     Blood Glucose Monitoring Suppl (ACCU-CHEK AVIVA PLUS) w/Device KIT Use to check blood sugar daily DX:E11.59 1 kit 0   cholecalciferol (VITAMIN D) 1000 units tablet Take 2,000 Units by mouth daily.     docusate sodium (COLACE) 100 MG capsule Take 200 mg by mouth at bedtime.     ELIQUIS 5 MG TABS tablet TAKE 1 TABLET BY MOUTH TWICE A DAY 60 tablet 5   ferrous sulfate 325 (65 FE) MG tablet Take 325 mg by mouth daily with breakfast.     furosemide (LASIX) 40 MG tablet Take 80 mg by mouth.     gabapentin (NEURONTIN) 100 MG capsule Take 1 capsule (100 mg total) by mouth 3 (three) times daily. 270 capsule 3   glucose blood (ACCU-CHEK AVIVA PLUS) test strip Use to check blood sugar daily DX:E11.59 100 each 3   latanoprost (XALATAN) 0.005 % ophthalmic solution Place 1 drop into both eyes daily.     metFORMIN (GLUCOPHAGE) 500 MG tablet TAKE 1 TABLET BY MOUTH THREE TIMES A DAY 270 tablet 3   metoprolol tartrate (LOPRESSOR) 50 MG tablet      pantoprazole (PROTONIX) 40 MG tablet TAKE 1 TABLET BY MOUTH EVERY DAY 90 tablet 3   Propylene Glycol 0.6 % SOLN Place 1 drop into the left eye 2 (two) times daily as needed (for dry eyes).     repaglinide (PRANDIN)  2 MG tablet TAKE 1 TABLET BY MOUTH 3 TIMES DAILY BEFORE MEALS. (Patient taking differently: Take 2 mg by mouth 3 (three) times daily before meals.) 270 tablet 3   simvastatin (ZOCOR) 10 MG tablet TAKE 1 TABLET BY MOUTH EVERY DAY 90 tablet 3   Tiotropium Bromide-Olodaterol (STIOLTO RESPIMAT) 2.5-2.5 MCG/ACT AERS Inhale 2 puffs into the lungs daily. 4 g 0   vitamin B-12 (CYANOCOBALAMIN) 250 MCG tablet Take 2 tablets (500 mcg total) by mouth daily.     ferrous sulfate 325 (65 FE) MG tablet Take 1 tablet (325 mg total) by mouth 2 (two) times daily with a meal. (Patient not taking: No sig reported) 60 tablet 6   No facility-administered medications prior to visit.    ROS: Review of Systems  Constitutional:  Positive for fatigue. Negative for appetite change and unexpected weight change.  HENT:  Negative for congestion, nosebleeds, sneezing, sore throat and trouble swallowing.   Eyes:  Negative for itching and visual disturbance.  Respiratory:  Positive for shortness of breath. Negative for cough.   Cardiovascular:  Negative for chest pain, palpitations and leg swelling.  Gastrointestinal:  Negative for abdominal distention, blood in stool, diarrhea and nausea.  Genitourinary:  Negative for frequency and  hematuria.  Musculoskeletal:  Positive for arthralgias, back pain and gait problem. Negative for joint swelling and neck pain.  Skin:  Negative for rash.  Neurological:  Positive for weakness. Negative for dizziness, tremors and speech difficulty.  Psychiatric/Behavioral:  Negative for agitation, dysphoric mood, sleep disturbance and suicidal ideas. The patient is not nervous/anxious.    Objective:  BP 140/72 (BP Location: Left Arm)   Pulse 69   Temp 97.7 F (36.5 C) (Oral)   Ht _0  (1.727 m)   Wt 226 lb (102.5 kg)   SpO2 93%   BMI 34.36 kg/m   BP Readings from Last 3 Encounters:  09/01/21 140/72  08/21/21 120/72  05/29/21 120/70    Wt Readings from Last 3 Encounters:  09/01/21  226 lb (102.5 kg)  08/21/21 224 lb 12.8 oz (102 kg)  05/29/21 227 lb 3.2 oz (103.1 kg)    Physical Exam Constitutional:      General: He is not in acute distress.    Appearance: He is well-developed. He is obese.     Comments: NAD  Eyes:     Conjunctiva/sclera: Conjunctivae normal.     Pupils: Pupils are equal, round, and reactive to light.  Neck:     Thyroid: No thyromegaly.     Vascular: No JVD.  Cardiovascular:     Rate and Rhythm: Normal rate and regular rhythm.     Heart sounds: Normal heart sounds. No murmur heard.   No friction rub. No gallop.  Pulmonary:     Effort: Pulmonary effort is normal. No respiratory distress.     Breath sounds: Normal breath sounds. No wheezing or rales.  Chest:     Chest wall: No tenderness.  Abdominal:     General: Bowel sounds are normal. There is no distension.     Palpations: Abdomen is soft. There is no mass.     Tenderness: There is no abdominal tenderness. There is no guarding or rebound.  Musculoskeletal:        General: No tenderness. Normal range of motion.     Cervical back: Normal range of motion.  Lymphadenopathy:     Cervical: No cervical adenopathy.  Skin:    General: Skin is warm and dry.     Findings: No rash.  Neurological:     Mental Status: He is alert and oriented to person, place, and time.     Cranial Nerves: No cranial nerve deficit.     Motor: Weakness present. No abnormal muscle tone.     Coordination: Coordination normal.     Gait: Gait abnormal.     Deep Tendon Reflexes: Reflexes are normal and symmetric.  Psychiatric:        Behavior: Behavior normal.        Thought Content: Thought content normal.        Judgment: Judgment normal.    Lab Results  Component Value Date   WBC 9.7 04/23/2021   HGB 11.5 (L) 04/23/2021   HCT 37.9 04/23/2021   PLT 294 04/23/2021   GLUCOSE 145 (H) 04/07/2021   CHOL 126 08/09/2020   TRIG 149 08/09/2020   HDL 30 (L) 08/09/2020   LDLCALC 70 08/09/2020   ALT 14  02/27/2021   AST 18 02/27/2021   NA 137 04/07/2021   K 3.9 04/07/2021   CL 101 04/07/2021   CREATININE 1.37 (H) 04/07/2021   BUN 22 04/07/2021   CO2 22 04/07/2021   TSH 3.740 08/09/2020   PSA 1.18 07/02/2017  INR 2.3 03/03/2012   HGBA1C 7.5 (H) 02/27/2021   MICROALBUR 0.7 04/04/2018    CT ENTERO ABD/PELVIS W CONTAST  Result Date: 08/20/2020 CLINICAL DATA:  Follow-up small bowel wall thickening EXAM: CT ABDOMEN AND PELVIS WITH CONTRAST (ENTEROGRAPHY) TECHNIQUE: Multidetector CT of the abdomen and pelvis during bolus administration of intravenous contrast. Negative oral contrast was given. CONTRAST:  118m OMNIPAQUE IOHEXOL 300 MG/ML  SOLN COMPARISON:  CT abdomen/pelvis dated 08/05/2020. CT abdomen/pelvis dated 08/04/2020. FINDINGS: Lower chest: Mild subpleural reticulation/fibrosis at the lung bases. Hepatobiliary: Liver is within normal limits. Gallbladder is unremarkable. No intrahepatic or extrahepatic ductal dilatation. Pancreas: Within normal limits. Spleen: Within normal limits. Adrenals/Urinary Tract: Adrenal glands are within normal limits. 8 mm lateral left upper pole renal cyst (series 8/image 16). Right kidney is within normal limits. No hydronephrosis. Bladder is within normal limits, although partially obscured by streak artifact. Stomach/Bowel: Stomach is within normal limits. No evidence of bowel obstruction. No small bowel wall thickening or mass is evident on CT. Specifically, the prior segment of abnormal small bowel beneath the right mid anterior abdominal wall is no longer evident. Normal appendix (series 2/image 81). No colonic wall thickening or inflammatory changes. Vascular/Lymphatic: No evidence of abdominal aortic aneurysm. Atherosclerotic calcifications of the abdominal aorta and branch vessels. No suspicious abdominopelvic lymphadenopathy. Reproductive: Prostate is grossly unremarkable, although partially obscured by streak artifact. Other: No abdominopelvic ascites.  Musculoskeletal: Left hip arthroplasty, without evidence of complication. Mild degenerative changes of the visualized thoracolumbar spine. Median sternotomy. IMPRESSION: No small bowel wall thickening or mass is evident on CT. Specifically, the prior segment of abnormal small bowel beneath the right mid anterior abdominal wall is no longer evident. Presumably, this reflected infectious/inflammatory small bowel enteritis, which has now resolved. Additional stable ancillary findings as above. Electronically Signed   By: SJulian HyM.D.   On: 08/20/2020 16:50    Assessment & Plan:   Problem List Items Addressed This Visit     CAD (coronary artery disease), native coronary artery (Chronic)    No angina Cont w/Eliquis, Amlodipine, Lopressor, Zocor      Diabetes mellitus, type 2 (HCC) (Chronic)    Cont on Metformin, Prandin Check A1c      Relevant Orders   Comprehensive metabolic panel   Hemoglobin A1c   Paroxysmal atrial fibrillation (HCC) (Chronic)    Cont w/Eliquis      Relevant Orders   Comprehensive metabolic panel   Hemoglobin A1c   B12 deficiency    On B12 Check B12      Relevant Orders   Vitamin B12   Back pain - Primary    Dr RNelva Bushinjected back this am      Other Visit Diagnoses     Needs flu shot       Relevant Orders   Flu Vaccine QUAD High Dose(Fluad) (Completed)         Follow-up: Return in about 3 months (around 12/02/2021) for a follow-up visit.  AWalker Kehr MD

## 2021-09-18 DIAGNOSIS — M47816 Spondylosis without myelopathy or radiculopathy, lumbar region: Secondary | ICD-10-CM | POA: Diagnosis not present

## 2021-10-02 ENCOUNTER — Encounter (INDEPENDENT_AMBULATORY_CARE_PROVIDER_SITE_OTHER): Payer: Self-pay | Admitting: Ophthalmology

## 2021-10-02 ENCOUNTER — Other Ambulatory Visit: Payer: Self-pay

## 2021-10-02 ENCOUNTER — Ambulatory Visit (INDEPENDENT_AMBULATORY_CARE_PROVIDER_SITE_OTHER): Payer: Medicare Other | Admitting: Ophthalmology

## 2021-10-02 DIAGNOSIS — H2511 Age-related nuclear cataract, right eye: Secondary | ICD-10-CM | POA: Diagnosis not present

## 2021-10-02 DIAGNOSIS — H353211 Exudative age-related macular degeneration, right eye, with active choroidal neovascularization: Secondary | ICD-10-CM

## 2021-10-02 HISTORY — DX: Age-related nuclear cataract, right eye: H25.11

## 2021-10-02 MED ORDER — BEVACIZUMAB 2.5 MG/0.1ML IZ SOSY
2.5000 mg | PREFILLED_SYRINGE | INTRAVITREAL | Status: AC | PRN
  Administered 2021-10-02: 10:00:00 2.5 mg via INTRAVITREAL

## 2021-10-02 NOTE — Progress Notes (Signed)
10/02/2021     CHIEF COMPLAINT Patient presents for  Chief Complaint  Patient presents with   Retina Follow Up    5 week fu OD and Avastin OD Pt states VA OU stable since last visit. Pt denies FOL, floaters, or ocular pain OU.  A1C: unknown LBS: 130 something (I do not check it all the time) Pt reports that he uses Latanoprost In OD "but only about once a week."       HISTORY OF PRESENT ILLNESS: [name] is a 86 y.o. male who presents to the clinic today for:   HPI     Retina Follow Up   Patient presents with  Wet AMD.  In right eye.  This started 5 weeks ago.  Severity is mild.  Duration of 5 weeks.  Since onset it is stable. Additional comments: 5 week fu OD and Avastin OD Pt states VA OU stable since last visit. Pt denies FOL, floaters, or ocular pain OU.  A1C: unknown LBS: 130 something (I do not check it all the time) Pt reports that he uses Latanoprost In OD "but only about once a week."         Comments   5 week fu OD oct avastin OD. Patient states vision is stable and unchanged since last visit. Denies any new floaters or FOL. Pt states he uses latanoprost qhs OD.      Last edited by Laurin Coder on 10/02/2021  9:23 AM.      Referring physician: Lisabeth Pick, MD [address] [place],  [place] [address]  HISTORICAL INFORMATION:   Selected notes from the MEDICAL RECORD NUMBER    Lab Results  Component Value Date   HGBA1C 6.9 (H) 09/01/2021     CURRENT MEDICATIONS: Current Outpatient Medications (Ophthalmic Drugs)  Medication Sig   latanoprost (XALATAN) 0.005 % ophthalmic solution Place 1 drop into both eyes daily.   Propylene Glycol 0.6 % SOLN Place 1 drop into the left eye 2 (two) times daily as needed (for dry eyes).   No current facility-administered medications for this visit. (Ophthalmic Drugs)   Current Outpatient Medications (Other)  Medication Sig   Accu-Chek Softclix Lancets lancets Use to check blood sugar  daily DX:E11.59   albuterol (VENTOLIN HFA) 108 (90 Base) MCG/ACT inhaler TAKE 2 PUFFS BY MOUTH EVERY 6 HOURS AS NEEDED FOR WHEEZE OR SHORTNESS OF BREATH   amiodarone (PACERONE) 200 MG tablet Take 1 tablet (200 mg total) by mouth daily. Except Sunday.   bisacodyl (DULCOLAX) 5 MG EC tablet Take 5 mg by mouth daily as needed for moderate constipation.   Blood Glucose Monitoring Suppl (ACCU-CHEK AVIVA PLUS) w/Device KIT Use to check blood sugar daily DX:E11.59   cholecalciferol (VITAMIN D) 1000 units tablet Take 2,000 Units by mouth daily.   docusate sodium (COLACE) 100 MG capsule Take 200 mg by mouth at bedtime.   ELIQUIS 5 MG TABS tablet TAKE 1 TABLET BY MOUTH TWICE A DAY   ferrous sulfate 325 (65 FE) MG tablet Take 1 tablet (325 mg total) by mouth 2 (two) times daily with a meal. (Patient not taking: No sig reported)   ferrous sulfate 325 (65 FE) MG tablet Take 325 mg by mouth daily with breakfast.   furosemide (LASIX) 40 MG tablet Take 80 mg by mouth.   gabapentin (NEURONTIN) 100 MG capsule Take 1 capsule (100 mg total) by mouth 3 (three) times daily.   glucose blood (ACCU-CHEK AVIVA PLUS) test strip Use  to check blood sugar daily DX:E11.59   metFORMIN (GLUCOPHAGE) 500 MG tablet TAKE 1 TABLET BY MOUTH THREE TIMES A DAY   metoprolol tartrate (LOPRESSOR) 50 MG tablet    pantoprazole (PROTONIX) 40 MG tablet TAKE 1 TABLET BY MOUTH EVERY DAY   repaglinide (PRANDIN) 2 MG tablet TAKE 1 TABLET BY MOUTH 3 TIMES DAILY BEFORE MEALS. (Patient taking differently: Take 2 mg by mouth 3 (three) times daily before meals.)   simvastatin (ZOCOR) 10 MG tablet TAKE 1 TABLET BY MOUTH EVERY DAY   Tiotropium Bromide-Olodaterol (STIOLTO RESPIMAT) 2.5-2.5 MCG/ACT AERS Inhale 2 puffs into the lungs daily.   vitamin B-12 (CYANOCOBALAMIN) 250 MCG tablet Take 2 tablets (500 mcg total) by mouth daily.   No current facility-administered medications for this visit. (Other)      REVIEW OF SYSTEMS:    ALLERGIES No Known  Allergies  PAST MEDICAL HISTORY Past Medical History:  Diagnosis Date   Acute diastolic CHF (congestive heart failure) ([place]) 08/04/2020   Arthritis    Atrial fibrillation ([place])    post op; amiodarone and coumadin continued for 3 mos post op   CARCINOMA, SKIN, SQUAMOUS CELL 01/07/2010   Cataract    beginning of cataracts   COLONIC POLYPS, HX OF 04/26/2007   Coronary artery disease    s/p CABG 3/12: L-LAD, S-CFX (Dr. Roxy Manns); EF 45% at cath prior to CABG   DIABETES MELLITUS, TYPE II 04/26/2007   Hyperlipidemia    HYPERTENSION 04/26/2007   NEPHROLITHIASIS, HX OF 04/26/2007   SBO (small bowel obstruction) ([place]) 10/2015   Partial    Shortness of breath dyspnea    with exertion   Unspecified hearing loss 12/18/2008   Past Surgical History:  Procedure Laterality Date   BACK SURGERY     disectomy   CARDIOVERSION N/A 06/08/2018   Procedure: CARDIOVERSION;  Surgeon: Buford Dresser, MD;  Location: [facility] ENDOSCOPY;  Service: Cardiovascular;  Laterality: N/A;   CARDIOVERSION N/A 07/25/2018   Procedure: CARDIOVERSION;  Surgeon: Fay Records, MD;  Location: [facility];  Service: Cardiovascular;  Laterality: N/A;   COLONOSCOPY     CORONARY ARTERY BYPASS GRAFT  01/12/2011   LUMBAR LAMINECTOMY/DECOMPRESSION MICRODISCECTOMY N/A 05/08/2015   Procedure: LUMBAR LAMINECTOMY/DECOMPRESSION MICRODISCECTOMY 1 LEVEL Lumbar four five;  Surgeon: Melina Schools, MD;  Location: [facility];  Service: Orthopedics;  Laterality: N/A;   TOTAL HIP ARTHROPLASTY Left    left on 04/15/07   TYMPANIC MEMBRANE REPAIR  1981    FAMILY HISTORY Family History  Problem Relation Age of Onset   Heart failure Mother    Heart disease Father    Cancer Sister    Cancer Brother    Heart disease Brother    Breast cancer Sister    Colon cancer Neg Hx    Esophageal cancer Neg Hx    Rectal cancer Neg Hx    Stomach cancer Neg Hx     SOCIAL HISTORY Social History   Tobacco Use   Smoking status: Former    Packs/day: 2.00     Years: 30.00    Pack years: 60.00    Types: Cigarettes    Quit date: 01/02/1981    Years since quitting: 40.7   Smokeless tobacco: Never  Vaping Use   Vaping Use: Never used  Substance Use Topics   Alcohol use: No   Drug use: No         OPHTHALMIC EXAM:  Base Eye Exam     Visual Acuity (ETDRS)  Right Left   Dist cc CF at 3' 20/20 -1   Dist ph cc NI     Correction: Glasses         Tonometry (Tonopen, 9:25 AM)       Right Left   Pressure 11 16         Pupils       Pupils Dark Light APD   Right PERRL 2.5 2 None   Left PERRL 2.5 2 None         Extraocular Movement       Right Left    Full Full         Neuro/Psych     Oriented x3: Yes   Mood/Affect: Normal         Dilation     Right eye: 1.0% Mydriacyl, 2.5% Phenylephrine @ 9:25 AM           Slit Lamp and Fundus Exam     External Exam       Right Left   External Normal Normal         Slit Lamp Exam       Right Left   Lids/Lashes Normal Normal   Conjunctiva/Sclera White and quiet White and quiet   Cornea Clear Clear   Anterior Chamber Deep and quiet Deep and quiet   Iris Round and reactive Round and reactive   Lens 3+ Nuclear sclerosis Posterior chamber intraocular lens   Anterior Vitreous Normal Normal         Fundus Exam       Right Left   Posterior Vitreous Normal    Disc Normal    C/D Ratio 0.5    Macula Subretinal hemorrhage, Hemorrhage, with sub-RPE hemorrhage, approximately 3 disc areas in size with adjacent subretinal hemorrhage and other 3 disc areas in size, with no new hemorrhage, and now some evidence of darkening of the hemorrhage thus resolution and temporal edge of large R PED, may have a small tear in the RPE    Vessels Normal    Periphery Normal             IMAGING AND PROCEDURES  Imaging and Procedures for 10/02/21  Intravitreal Injection, Pharmacologic Agent - OD - Right Eye       Time Out 10/02/2021. 9:58 AM. Confirmed correct  patient, procedure, site, and patient consented.   Anesthesia Topical anesthesia was used. Anesthetic medications included Lidocaine 4%.   Procedure Preparation included 5% betadine to ocular surface, 10% betadine to eyelids, Ofloxacin , Tobramycin 0.3%. A 30 gauge needle was used.   Injection: 2.5 mg bevacizumab 2.5 MG/0.1ML   Route: Intravitreal, Site: Right Eye   NDC: [id number]   Post-op Post injection exam found visual acuity of at least counting fingers. The patient tolerated the procedure well. There were no complications. The patient received written and verbal post procedure care education. Post injection medications included ocuflox.      OCT, Retina - OU - Both Eyes       Right Eye Quality was good. Scan locations included subfoveal. Central Foveal Thickness: 477. Progression has improved. Findings include abnormal foveal contour, intraretinal fluid, subretinal fluid, pigment epithelial detachment.   Left Eye Quality was good. Scan locations included subfoveal. Central Foveal Thickness: 306. Progression has been stable. Findings include abnormal foveal contour, retinal drusen .   Notes Significant hemorrhagic PED OU with subretinal hemorrhage in the.  Subfoveal location.  Wet AMD OD.  Slightly improved pigment epithelial detachment 1 month after  injection Avastin.  With less thickening, we will repeat injection today, and improved overall since onset of therapy June 2022             ASSESSMENT/PLAN:  Exudative age-related macular degeneration of right eye with active choroidal neovascularization (HCC) OD, since onset, stabilized condition with large subfoveal disciform scar subretinal fluid vascularized PED.  Repeat injection today at 1 month follow-up  Nuclear sclerotic cataract of right eye Moderate to severe cataract which she has some component of visual loss.  Combined with CNVM centrally.  We will suggest cataract extraction with intraocular lens  placement in the right eye to maximize visual potential under Dr. Herbert Deaner or [facility] or the new Dr. Lucianne Lei     ICD-10-CM   1. Exudative age-related macular degeneration of right eye with active choroidal neovascularization (HCC)  H35.3211 Intravitreal Injection, Pharmacologic Agent - OD - Right Eye    OCT, Retina - OU - Both Eyes    bevacizumab (AVASTIN) SOSY 2.5 mg    2. Nuclear sclerotic cataract of right eye  H25.11       1.  OD vastly improved overall.  Yet still active subfoveal membrane.  With subretinal fluid.  We will limit acuity ultimately.  Does need to have cataract surgery however.  2.  Follow-up in 2 to 4 months with [facility] eye care, Dr. Lucianne Lei consideration of countertraction intraocular lens placement OD  3.  Repeat intravitreal Avastin OD today follow-up next in 6 weeks  Ophthalmic Meds Ordered this visit:  Meds ordered this encounter  Medications   bevacizumab (AVASTIN) SOSY 2.5 mg       Return in about 6 weeks (around 11/13/2021) for DILATE OU, AVASTIN OCT, OD.  There are no Patient Instructions on file for this visit.   Explained the diagnoses, plan, and follow up with the patient and they expressed understanding.  Patient expressed understanding of the importance of proper follow up care.   Clent Demark Marisol Glazer M.D. Diseases & Surgery of the Retina and Vitreous [facility]ina [facility] Di[facility] [place] 10/02/21     Abbreviations: M myopia (nearsighted); A astigmatism; H hyperopia (farsighted); P presbyopia; Mrx spectacle prescription;  CTL contact lenses; OD right eye; OS left eye; OU both eyes  XT exotropia; ET esotropia; PEK punctate epithelial keratitis; PEE punctate epithelial erosions; DES dry eye syndrome; MGD meibomian gland dysfunction; ATs artificial tears; PFAT's preservative free artificial tears; [place] nuclear sclerotic cataract; PSC posterior subcapsular cataract; ERM epi-retinal membrane; PVD posterior vitreous detachment; RD retinal detachment; DM diabetes  mellitus; DR diabetic retinopathy; NPDR non-proliferative diabetic retinopathy; PDR proliferative diabetic retinopathy; CSME clinically significant macular edema; DME diabetic macular edema; dbh dot blot hemorrhages; CWS cotton wool spot; POAG primary open angle glaucoma; C/D cup-to-disc ratio; HVF humphrey visual field; GVF goldmann visual field; OCT optical coherence tomography; IOP intraocular pressure; BRVO Branch retinal vein occlusion; CRVO central retinal vein occlusion; CRAO central retinal artery occlusion; BRAO branch retinal artery occlusion; RT retinal tear; SB scleral buckle; PPV pars plana vitrectomy; VH Vitreous hemorrhage; PRP panretinal laser photocoagulation; IVK intravitreal kenalog; VMT vitreomacular traction; MH Macular hole;  NVD neovascularization of the disc; NVE neovascularization elsewhere; AREDS age related eye disease study; ARMD age related macular degeneration; POAG primary open angle glaucoma; EBMD epithelial/anterior basement membrane dystrophy; ACIOL anterior chamber intraocular lens; IOL intraocular lens; PCIOL posterior chamber intraocular lens; Phaco/IOL phacoemulsification with intraocular lens placement; [place] photorefractive keratectomy; LASIK laser assisted in situ keratomileusis; HTN hypertension; DM diabetes mellitus; COPD chronic obstructive pulmonary disease

## 2021-10-02 NOTE — Assessment & Plan Note (Addendum)
OD, since onset, stabilized condition with large subfoveal disciform scar subretinal fluid vascularized PED.  Repeat injection today at 1 month follow-up

## 2021-10-02 NOTE — Assessment & Plan Note (Signed)
Moderate to severe cataract which she has some component of visual loss.  Combined with CNVM centrally.  We will suggest cataract extraction with intraocular lens placement in the right eye to maximize visual potential under Dr. Herbert Deaner or [facility] or the new Dr. Lucianne Lei

## 2021-10-10 ENCOUNTER — Other Ambulatory Visit: Payer: Self-pay | Admitting: Internal Medicine

## 2021-10-27 ENCOUNTER — Other Ambulatory Visit: Payer: Self-pay | Admitting: Internal Medicine

## 2021-10-27 MED ORDER — ACCU-CHEK AVIVA PLUS VI STRP: ORAL_STRIP | 3 refills | Status: DC

## 2021-11-07 ENCOUNTER — Ambulatory Visit (INDEPENDENT_AMBULATORY_CARE_PROVIDER_SITE_OTHER): Payer: Medicare Other

## 2021-11-07 DIAGNOSIS — E1159 Type 2 diabetes mellitus with other circulatory complications: Secondary | ICD-10-CM

## 2021-11-07 DIAGNOSIS — I152 Hypertension secondary to endocrine disorders: Secondary | ICD-10-CM

## 2021-11-07 DIAGNOSIS — I2510 Atherosclerotic heart disease of native coronary artery without angina pectoris: Secondary | ICD-10-CM

## 2021-11-07 DIAGNOSIS — I480 Paroxysmal atrial fibrillation: Secondary | ICD-10-CM

## 2021-11-07 NOTE — Patient Instructions (Addendum)
Visit Information  Following are the goals we discussed today:   Tack and Manage My Blood Pressure/ HR  - HTN/ Afib  Timeframe:  Long-Range Goal Priority:  High Start Date:   07/15/2021                          Expected End Date: 10/16/2022                      Follow Up Date 05/06/2022   - check blood pressure 3 times per week - choose a place to take my blood pressure (home, clinic or office, retail store) - write blood pressure results in a log or diary    Why is this important?   You won't feel high blood pressure, but it can still hurt your blood vessels.  High blood pressure can cause heart or kidney problems. It can also cause a stroke.  Making lifestyle changes like losing a little weight or eating less salt will help.  Checking your blood pressure at home and at different times of the day can help to control blood pressure.  If the doctor prescribes medicine remember to take it the way the doctor ordered.  Call the office if you cannot afford the medicine or if there are questions about it.   Track and Manage My Blood Sugars - DM   Timeframe:  Long-Range Goal Priority:  High Start Date: 07/15/2021                            Expected End Date:  10/16/2022                     Follow Up Date 05/06/2022   - check blood sugar at prescribed times - check blood sugar before and after exercise - check blood sugar if I feel it is too high or too low - take the blood sugar log to all doctor visits - take the blood sugar meter to all doctor visits    Why is this important?   Checking your blood sugar at home helps to keep it from getting very high or very low.  Writing the results in a diary or log helps the doctor know how to care for you.  Your blood sugar log should have the time, date and the results.  Also, write down the amount of insulin or other medicine that you take.  Other information, like what you ate, exercise done and how you were feeling, will also be helpful.     Plan: Telephone follow up appointment with care management team member scheduled for:  6 months  The patient has been provided with contact information for the care management team and has been advised to call with any health related questions or concerns.   [name], PharmD Clinical Pharmacist, [facility]   Please call the care guide team at [phone] if you need to cancel or reschedule your appointment.   Patient verbalizes understanding of instructions provided today and agrees to view in [place].

## 2021-11-07 NOTE — Progress Notes (Addendum)
Chronic Care Management Pharmacy Note  11/07/2021 Name:  [name] MRN:  [id number] DOB:  May 19, 1935  Summary: -Patient has been doing well since last visit, reports that he has been checking BP and BG periodically, has been in range when he has checked - BP averaging 120-130/75, last BG was 118 (lastest A1c improved to 6.9%) -Reports that he feels like his breathing has been under good control, has not felt the need to use either of inhalers recently  -Denies any issues with any of his current medications  Recommendations/Changes made from today's visit: -Recommending no medication changes at this time, patient to continue monitoring blood pressure and blood sugars at least once weekly - patient will reach out to office shold BP >140/90 on average or if BG >150 on average    Subjective: [name] is an 86 y.o. year old male who is a primary patient of [name], Evie Lacks, MD.  The CCM team was consulted for assistance with disease management and care coordination needs.    Engaged with patient by telephone for follow up visit in response to provider referral for pharmacy case management and/or care coordination services.   Consent to Services:  The patient was given the following information about Chronic Care Management services today, agreed to services, and gave verbal consent: 1. CCM service includes personalized support from designated clinical staff supervised by the primary care provider, including individualized plan of care and coordination with other care providers 2. 24/7 contact phone numbers for assistance for urgent and routine care needs. 3. Service will only be billed when office clinical staff spend 20 minutes or more in a month to coordinate care. 4. Only one practitioner may furnish and bill the service in a calendar month. 5.The patient may stop CCM services at any time (effective at the end of the month) by phone call to the office staff. 6. The  patient will be responsible for cost sharing (co-pay) of up to 20% of the service fee (after annual deductible is met). Patient agreed to services and consent obtained.  Patient Care Team: [name], Evie Lacks, MD as PCP - General (Internal Medicine) Fay Records, MD as PCP - Cardiology (Cardiology) Melina Schools, MD as Consulting Physician (Orthopedic Surgery) Suella Broad, MD as Consulting Physician (Physical Medicine and Rehabilitation) Hortencia Pilar, MD as Consulting Physician (Ophthalmology) Zadie Rhine Clent Demark, MD as Consulting Physician (Ophthalmology) Melina Schools, MD as Consulting Physician (Orthopedic Surgery) [name], [facility] as Pharmacist (Pharmacist)  Recent office visits:  09/01/2021 - Dr. Alain Marion - no changes to medications - follow up in 3 months    Recent consult visits: 10/02/2021 - Dr. Zadie Rhine - Ophthalmology  08/27/2021 - Dr. Zadie Rhine - Ophthalmology  08/21/2021 - Dr. Curly Shores - Cardiology - to continue lasix 65m daily - follow up in 12 months    Hospital visits:  None since last visit    Objective:  Lab Results  Component Value Date   CREATININE 1.29 09/01/2021   BUN 18 09/01/2021   GFR 50.88 (L) 09/01/2021   GFRNONAA 53 (L) 11/22/2020   GFRAA 62 11/22/2020   NA 141 09/01/2021   K 3.4 (L) 09/01/2021   CALCIUM 9.1 09/01/2021   CO2 28 09/01/2021   GLUCOSE 115 (H) 09/01/2021    Lab Results  Component Value Date/Time   HGBA1C 6.9 (H) 09/01/2021 02:49 PM   HGBA1C 7.5 (H) 02/27/2021 03:30 PM   GFR 50.88 (L) 09/01/2021 02:49 PM   GFR 38.57 (L) 02/27/2021  03:30 PM   MICROALBUR 0.7 04/04/2018 02:16 PM    Last diabetic Eye exam:  Lab Results  Component Value Date/Time   HMDIABEYEEXA Retinopathy (A) 04/30/2021 12:00 AM    Last diabetic Foot exam:  Lab Results  Component Value Date/Time   HMDIABFOOTEX done 11/17/2011 12:00 AM     Lab Results  Component Value Date   CHOL 126 08/09/2020   HDL 30 (L) 08/09/2020   LDLCALC 70 08/09/2020    TRIG 149 08/09/2020   CHOLHDL 4.2 08/09/2020    Hepatic Function Latest Ref Rng & Units 09/01/2021 02/27/2021 08/15/2020  Total Protein 6.0 - 8.3 g/dL 7.1 7.2 6.9  Albumin 3.5 - 5.2 g/dL 4.2 3.8 -  AST 0 - 37 U/L '25 18 16  ' ALT 0 - 53 U/L '22 14 18  ' Alk Phosphatase 39 - 117 U/L 50 45 -  Total Bilirubin 0.2 - 1.2 mg/dL 0.7 0.4 0.4  Bilirubin, Direct 0.00 - 0.40 mg/dL - - -    Lab Results  Component Value Date/Time   TSH 3.740 08/09/2020 01:56 PM   TSH 3.310 12/22/2019 12:21 PM    CBC Latest Ref Rng & Units 04/23/2021 04/07/2021 03/25/2021  WBC 3.4 - 10.8 x10E3/uL 9.7 8.1 6.1  Hemoglobin 13.0 - 17.7 g/dL 11.5(L) 9.3(L) 9.1(L)  Hematocrit 37.5 - 51.0 % 37.9 31.4(L) 30.6(L)  Platelets 150 - 450 x10E3/uL 294 357 372    No results found for: VD25OH  Clinical ASCVD: No  The ASCVD Risk score (Arnett DK, et al., 2019) failed to calculate for the following reasons:   The 2019 ASCVD risk score is only valid for ages 34 to 62    Depression screen PHQ 2/9 05/29/2021 09/10/2020 07/02/2017  Decreased Interest 0 0 1  Down, Depressed, Hopeless 0 0 1  PHQ - 2 Score 0 0 2  Altered sleeping - - 2  Tired, decreased energy - - 1  Change in appetite - - 0  Feeling bad or failure about yourself  - - 0  Trouble concentrating - - 0  Moving slowly or fidgety/restless - - 0  Suicidal thoughts - - 0  PHQ-9 Score - - 5  Difficult doing work/chores - - Not difficult at all  Some recent data might be hidden   Social History   Tobacco Use  Smoking Status Former   Packs/day: 2.00   Years: 30.00   Pack years: 60.00   Types: Cigarettes   Quit date: 01/02/1981   Years since quitting: 40.8  Smokeless Tobacco Never   BP Readings from Last 3 Encounters:  09/01/21 140/72  08/21/21 120/72  05/29/21 120/70   Pulse Readings from Last 3 Encounters:  09/01/21 69  08/21/21 72  05/29/21 79   Wt Readings from Last 3 Encounters:  09/01/21 226 lb (102.5 kg)  08/21/21 224 lb 12.8 oz (102 kg)  05/29/21 227  lb 3.2 oz (103.1 kg)   BMI Readings from Last 3 Encounters:  09/01/21 34.36 kg/m  08/21/21 34.18 kg/m  05/29/21 34.55 kg/m    Assessment/Interventions: Review of patient past medical history, allergies, medications, health status, including review of consultants reports, laboratory and other test data, was performed as part of comprehensive evaluation and provision of chronic care management services.   SDOH:  (Social Determinants of Health) assessments and interventions performed: Yes  SDOH Screenings   Alcohol Screen: Low Risk    Last Alcohol Screening Score (AUDIT): 0  Depression (PHQ2-9): Low Risk    PHQ-2 Score: 0  Financial  Resource Strain: Low Risk    Difficulty of Paying Living Expenses: Not hard at all  Food Insecurity: No Food Insecurity   Worried About Charity fundraiser in the Last Year: Never true   Ran Out of Food in the Last Year: Never true  Housing: Low Risk    Last Housing Risk Score: 0  Physical Activity: Inactive   Days of Exercise per Week: 0 days   Minutes of Exercise per Session: 0 min  Social Connections: Engineer, building services of Communication with Friends and Family: More than three times a week   Frequency of Social Gatherings with Friends and Family: Once a week   Attends Religious Services: More than 4 times per year   Active Member of [employer] or Organizations: No   Attends Music therapist: More than 4 times per year   Marital Status: Married  Stress: No Stress Concern Present   Feeling of Stress : Not at all  Tobacco Use: Medium Risk   Smoking Tobacco Use: Former   Smokeless Tobacco Use: Never   Passive Exposure: Not on Pensions consultant Needs: No Transportation Needs   Lack of Transportation (Medical): No   Lack of Transportation (Non-Medical): No    CCM Care Plan  No Known Allergies  Medications Reviewed Today     Reviewed by [name], [facility] (Pharmacist) on 11/07/21 at 1430  Med List Status: <None>    Medication Order Taking? Sig Documenting Provider Last Dose Status Informant  Accu-Chek Softclix Lancets lancets [id number] Yes Use to check blood sugar daily DX:E11.59 [name], Evie Lacks, MD Taking Active Self  albuterol (VENTOLIN HFA) 108 (90 Base) MCG/ACT inhaler [id number] No TAKE 2 PUFFS BY MOUTH EVERY 6 HOURS AS NEEDED FOR WHEEZE OR SHORTNESS OF BREATH  Patient not taking: Reported on 11/07/2021   Collene Gobble, MD Not Taking Active   amiodarone (PACERONE) 200 MG tablet [id number] Yes Take 1 tablet (200 mg total) by mouth daily. Except Sunday. Fay Records, MD Taking Active   bisacodyl (DULCOLAX) 5 MG EC tablet [id number] Yes Take 5 mg by mouth daily as needed for moderate constipation. [provider] Taking Active   Blood Glucose Monitoring Suppl (ACCU-CHEK AVIVA PLUS) w/Device KIT [id number] Yes Use to check blood sugar daily DX:E11.59 [name], Evie Lacks, MD Taking Active Self  cholecalciferol (VITAMIN D) 1000 units tablet [id number] Yes Take 2,000 Units by mouth daily. [provider] Taking Active Self  docusate sodium (COLACE) 100 MG capsule [id number] Yes Take 200 mg by mouth at bedtime. [provider] Taking Active   ELIQUIS 5 MG TABS tablet [id number] Yes TAKE 1 TABLET BY MOUTH TWICE A DAY Fay Records, MD Taking Active   ferrous sulfate 325 (65 FE) MG tablet [id number] Yes Take 1 tablet (325 mg total) by mouth 2 (two) times daily with a meal. Fay Records, MD Taking Active     Discontinued 11/07/21 1424   furosemide (LASIX) 40 MG tablet [id number] Yes Take 2 tablets (80 mg total) by mouth daily. Fay Records, MD Taking Active   gabapentin (NEURONTIN) 100 MG capsule [id number] Yes Take 1 capsule (100 mg total) by mouth 3 (three) times daily. [name], Evie Lacks, MD Taking Active   glucose blood (ACCU-CHEK AVIVA PLUS) test strip [id number] Yes Use to check blood sugar daily DX:E11.59 [name], Evie Lacks, MD Taking Active   latanoprost (XALATAN) 0.005 %  ophthalmic solution [id number] Yes Place 1 drop into both eyes daily. [provider] Taking Active Self  metFORMIN (GLUCOPHAGE) 500 MG tablet [id number] Yes TAKE 1 TABLET BY MOUTH THREE TIMES A DAY [name], Evie Lacks, MD Taking Active      Discontinued 11/07/21 1425 (Change in therapy)   pantoprazole (PROTONIX) 40 MG tablet [id number] Yes TAKE 1 TABLET BY MOUTH EVERY DAY [name], Evie Lacks, MD Taking Active   Propylene Glycol 0.6 % SOLN [id number] Yes Place 1 drop into the left eye 2 (two) times daily as needed (for dry eyes). [provider] Taking Active Self  repaglinide (PRANDIN) 2 MG tablet [id number] Yes TAKE 1 TABLET BY MOUTH 3 TIMES DAILY BEFORE MEALS. [name], Evie Lacks, MD Taking Active   simvastatin (ZOCOR) 10 MG tablet [id number] Yes TAKE 1 TABLET BY MOUTH EVERY DAY Fay Records, MD Taking Active   Tiotropium Bromide-Olodaterol (STIOLTO RESPIMAT) 2.5-2.5 MCG/ACT AERS [id number] No Inhale 2 puffs into the lungs daily.  Patient not taking: Reported on 11/07/2021   Martyn Ehrich, NP Not Taking Active   vitamin B-12 (CYANOCOBALAMIN) 250 MCG tablet [id number] Yes Take 2 tablets (500 mcg total) by mouth daily. Fay Records, MD Taking Active             Patient Active Problem List   Diagnosis Date Noted   Nuclear sclerotic cataract of right eye 10/02/2021   B12 deficiency 09/01/2021   Anemia 05/29/2021   Exudative age-related macular degeneration of right eye with active choroidal neovascularization ([place]) 05/12/2021   Intermediate stage nonexudative age-related macular degeneration of both eyes 05/12/2021   Left epiretinal membrane 05/12/2021   Atherosclerosis of aorta ([place]) 03/03/2021   Squamous cell skin cancer 01/12/2021   Chronic anticoagulation 12/26/2020   Essential hypertension    SBO (small bowel obstruction) ([place]) 09/32/3557   Acute diastolic CHF (congestive heart failure) ([place]) 08/04/2020   ILD (interstitial lung disease) ([place]) 07/31/2020    Acute respiratory disease due to COVID-19 virus 10/24/2019   Acute respiratory failure with hypoxia ([place]) 10/24/2019   Near syncope 04/12/2019   Dysphagia 02/21/2019   Dyspnea 02/21/2019   History of total knee replacement, left 11/02/2018   Spinal stenosis of lumbar region 10/03/2018   Diabetic neuropathy ([place]) 07/05/2018   RUQ abdominal pain 04/04/2018   Vertigo 04/04/2018   Well adult exam 07/02/2017   Abdominal pain 07/02/2017   Constipation 02/18/2016   Small bowel obstruction ([place]) 11/06/2015   Back pain 05/08/2015   Cough 10/17/2014   Hyperlipidemia associated with type 2 diabetes mellitus ([place]) 02/09/2011   CAD (coronary artery disease), native coronary artery 02/09/2011   Paroxysmal atrial fibrillation ([place]) 01/20/2011   Neoplasm of uncertain behavior of skin 01/07/2010   Unspecified hearing loss 12/18/2008   Diabetes mellitus, type 2 ([place]) 04/26/2007   Hypertension associated with diabetes ([place]) 04/26/2007   COLONIC POLYPS, HX OF 04/26/2007   NEPHROLITHIASIS, HX OF 04/26/2007    Immunization History  Administered Date(s) Administered   Fluad Quad(high Dose 65+) 08/18/2019, 11/29/2020, 09/01/2021   Influenza Nasal 08/28/2016   Influenza Whole 09/14/2007, 09/01/2010   Influenza, High Dose Seasonal PF 09/13/2009, 08/01/2012, 08/29/2014, 09/06/2017, 08/16/2018   Influenza,inj,Quad PF,6+ Mos 08/20/2015   Influenza-Unspecified 08/16/2008, 09/16/2008, 08/28/2010, 08/17/2011, 08/25/2013, 08/16/2014   PFIZER(Purple Top)SARS-COV-2 Vaccination 02/23/2020, 03/19/2020   Pneumococcal Conjugate-13 08/16/2014, 08/29/2014   Pneumococcal Polysaccharide-23 10/16/2002, 02/18/2016   Pneumococcal-Unspecified 08/16/2005, 08/28/2010   Td 11/16/1993, 12/18/2008   Tdap 11/16/2009   Zoster, Live 07/24/2011    Conditions to be addressed/monitored:  Hypertension, Hyperlipidemia, Diabetes, Atrial Fibrillation, Coronary Artery Disease, GERD, and  Neuropathy   Care Plan : CCM Care Plan   Updates made by [name], RPH since 11/07/2021 12:00 AM     Problem: HTN, HLD, CAD, DM2 Afib, Fe and B12 Anemia, GERD, Shortness of Breath, Neuropathy   Priority: High  Onset Date: 07/15/2021     Long-Range Goal: Disease Management   Start Date: 07/15/2021  Expected End Date: 11/07/2022  This Visit's Progress: On track  Recent Progress: On track  Priority: High  Note:   Current Barriers:  Unable to independently monitor therapeutic efficacy  Pharmacist Clinical Goal(s):  Patient will achieve adherence to monitoring guidelines and medication adherence to achieve therapeutic efficacy maintain control of Blood pressure/ HR, A1c, LDL, GERD, Neuropathy as evidenced by BP/HR and blood sugar logs, next lipid panel, acid reflux occurrent, and pain levels  through collaboration with PharmD and provider.   Interventions: 1:1 collaboration with [name], Evie Lacks, MD regarding development and update of comprehensive plan of care as evidenced by provider attestation and co-signature Inter-disciplinary care team collaboration (see longitudinal plan of care) Comprehensive medication review performed; medication list updated in electronic medical record  Hypertension (BP goal <140/90) -Controlled -Current treatment: Furosemide 37m daily  -Medications previously tried: diltiazem, lisinopril, metoprolol succinate,   -Current home readings: averaging 120-130/75 -Current dietary habits: low sodium, does not drink coffee, drinks diet cheerwine -12oz daily  -Current exercise habits: none at this time  -Denies hypotensive/hypertensive symptoms -Educated on BP goals and benefits of medications for prevention of heart attack, stroke and kidney damage; Daily salt intake goal < 2300 mg; Importance of home blood pressure monitoring; Proper BP monitoring technique; Symptoms of hypotension and importance of maintaining adequate hydration; -Counseled to monitor BP at home 3 times weekly,  document, and provide log at future appointments -Counseled on diet and exercise extensively Recommended to continue current medication  Hyperlipidemia: (LDL goal < 70) -Controlled Lab Results  Component Value Date   LDLCALC 70 08/09/2020  -Current treatment: Simvastatin 126m- 1 tablet daily  -Medications previously tried: n/a  -Current dietary patterns: notes that at times diet can be elevated in high cholesterol foods -Current exercise habits: none at this time  -Educated on Cholesterol goals;  Benefits of statin for ASCVD risk reduction; Importance of limiting foods high in cholesterol; Exercise goal of 150 minutes per week; Strategies to manage statin-induced myalgias; -Counseled on diet and exercise extensively Recommended to continue current medication  Diabetes (A1c goal <8%) -Controlled - A1c improved from previous result  Lab Results  Component Value Date   HGBA1C 6.9 (H) 09/01/2021  -Current medications: Repaglidine 73m8m 1 tablet 3 times daily before meals  Metformin 500m11m1 tablet 3 times daily  -Medications previously tried: n/a  -Current home glucose readings fasting glucose: averaging 110-118, highest he has seen it was 130 -Denies hypoglycemic/hyperglycemic symptoms -Current meal patterns:  breakfast: Eggs, sausage, fruit  lunch: smaller meal, depends on what is available to him, could not specify  dinner: vegetables and meat snacks: oatmeal cookie,  drinks: water or diet cheerwine  -Current exercise: none at this time  -Educated on A1c and blood sugar goals; Complications of diabetes including kidney damage, retinal damage, and cardiovascular disease; Exercise goal of 150 minutes per week; Benefits of weight loss; Prevention and management of hypoglycemic episodes; Benefits of routine self-monitoring of blood sugar; Carbohydrate counting and/or plate method -Counseled to check feet daily and get yearly eye exams -Counseled on diet and exercise  extensively Recommended to continue current medication  Atrial  Fibrillation (Goal: prevent stroke and major bleeding) -Controlled -CHADSVASC: at least 5 -Current treatment: Rate control: Amiodarone 244m - 1 tablet daily except Sundays  Anticoagulation: Eliquis 531m- 1 tablet twice daily  -Medications previously tried: warfarin -Home BP and HR readings:   -Counseled on increased risk of stroke due to Afib and benefits of anticoagulation for stroke prevention; importance of adherence to anticoagulant exactly as prescribed; bleeding risk associated with Eliquis and importance of self-monitoring for signs/symptoms of bleeding; avoidance of NSAIDs due to increased bleeding risk with anticoagulants; importance of regular laboratory monitoring; seeking medical attention after a head injury or if there is blood in the urine/stool; -Counseled on diet and exercise extensively Recommended for patient to stop use of ibuprofen that he reports taking due to increase risk of bleeding while taking  Shortness of Breath / Dyspnea (Goal: control symptoms and prevent exacerbations) -Controlled at this time -has not felt the need to use any of his inhalers  -Current treatment  Tiotropium Bromide-Olodaterol (Stiolto) 2.5-2.55m755mact - 2 puffs daily  Albuterol 108m96mct HFA inhaler - 2 puffs every 6 hours as needed  -Medications previously tried: n/a  -Exacerbations requiring treatment in last 6 months: none -Patient denies consistent use of maintenance inhaler -Frequency of rescue inhaler use: does not use -Counseled on Proper inhaler technique; Benefits of consistent maintenance inhaler use When to use rescue inhaler Differences between maintenance and rescue inhalers -Recommended to continue current medication  GERD (Goal: Prevention of acid reflux/heartburn) -Controlled -Current treatment  Pantoprazole 40mg68m tablet daily  -Medications previously tried: omeprazole   -Recommended to continue  current medication  Neuropathy (Goal: Pain control) -Controlled -Current treatment  Gabapentin 100mg 1mcapsule 3 times daily  -Medications previously tried: n/a  -Recommended to continue current medication   Glaucoma (Goal: Control of occular pressure)- follows with Dr. RankinZadie Rhinetrolled -Current treatment  Latanoprost 0.005% solution - 1 drop into both eyes daily - patient reports that he is only using in right eye at this time  -Medications previously tried: n/a  -Recommended to continue current medication  Iron/B12 Deficiency Anemia (Goal: maintenance of appropriate Iron and B12 levels) -Not ideally controlled - B12 and Iron Binding/Ferritin/Folate levels have not been rechecked since addition of medications  -Current treatment  Vitamin B12 250mcg 23mtablet daily Ferrous Sulfate 3255mg - 555mblet twice daily  -Medications previously tried: n/a  -Counseled on diet and exercise extensively Recommended to continue current medication  Health Maintenance -Vaccine gaps: Shingles, Tdap, COVID booster, Influenza vaccines  -Current therapy:  Docusate 100mg - 237msules daily  Bisacodyl 55mg - 1 t10met daily as needed  Vitamin D 1000 units - 2 tablet daily  Propylene Glycol 0.6% solution - 1 drop into both eyes twice daily as needed  -Educated on Cost vs benefit of each product must be carefully weighed by individual consumer Supplements may interfere with prescription drugs -Patient is satisfied with current therapy and denies issues -Recommended for patient to continue use of acetaminophen (up to 2000mg daily50m volatren gel 1% - applied 4 times daily for pain instead of oral NSAIDs due to bleeding risk   Patient Goals/Self-Care Activities Patient will:  - take medications as prescribed check glucose 3 times weekly, document, and provide at future appointments check blood pressure 3 times wekly, document, and provide at future appointments engage in dietary modifications by  reducing carbohydrate, high cholesterol, and sodium intake  Follow Up Plan: Telephone follow up appointment with care management team member scheduled  for: 6 months  The patient has been provided with contact information for the care management team and has been advised to call with any health related questions or concerns.      Medication Assistance: None required.  Patient affirms current coverage meets needs.  Patient's preferred pharmacy is:  [id number] [place], [place]Phone: [phone]: [phone] [facility]ates[address] [place]Phone: [phone]: [phone]  Uses pill box? Yes Pt endorses 80-85% compliance  Care Plan and Follow Up Patient Decision:  Patient agrees to Care Plan and Follow-up.  Plan: Telephone follow up appointment with care management team member scheduled for:  4 months and The patient has been provided with contact information for the care management team and has been advised to call with any health related questions or concerns.   DTomasa Blase PharmD Clinical Pharmacist, LBlue Riverscreening examination/treatment/procedure(s) were performed by non-physician practitioner and as supervising physician I was immediately available for consultation/collaboration.  I agree with above. ALew Dawes MD

## 2021-11-13 ENCOUNTER — Encounter (INDEPENDENT_AMBULATORY_CARE_PROVIDER_SITE_OTHER): Payer: Self-pay | Admitting: Ophthalmology

## 2021-11-13 ENCOUNTER — Other Ambulatory Visit: Payer: Self-pay

## 2021-11-13 ENCOUNTER — Ambulatory Visit (INDEPENDENT_AMBULATORY_CARE_PROVIDER_SITE_OTHER): Payer: Medicare Other | Admitting: Ophthalmology

## 2021-11-13 DIAGNOSIS — R0683 Snoring: Secondary | ICD-10-CM | POA: Diagnosis not present

## 2021-11-13 DIAGNOSIS — H353211 Exudative age-related macular degeneration, right eye, with active choroidal neovascularization: Secondary | ICD-10-CM | POA: Diagnosis not present

## 2021-11-13 MED ORDER — BEVACIZUMAB 2.5 MG/0.1ML IZ SOSY
2.5000 mg | PREFILLED_SYRINGE | INTRAVITREAL | Status: AC | PRN
  Administered 2021-11-13: 10:00:00 2.5 mg via INTRAVITREAL

## 2021-11-13 NOTE — Progress Notes (Signed)
11/13/2021     CHIEF COMPLAINT Patient presents for  Chief Complaint  Patient presents with   Retina Follow Up      HISTORY OF PRESENT ILLNESS: [name] is a 86 y.o. male who presents to the clinic today for:   HPI     Retina Follow Up           Diagnosis: Wet AMD   Laterality: right eye   Onset: 6 weeks ago   Severity: mild   Duration: 6 weeks   Course: stable         Comments   6 week fu. Dilate OU, Avastin OD and OCT- Exudative ARMD OD- Last injection 10/02/2021  Pt states VA OU stable since last visit. Pt denies FOL, floaters, or ocular pain OU.  Pt states, "I still have trouble shaving and seeing up close. "  Pt reports using Latanoprost QHD OD       Last edited by Kendra Opitz, COA on 11/13/2021  9:55 AM.      Referring physician: Cassandria Anger, MD [place],  [place] [address]  HISTORICAL INFORMATION:   Selected notes from the MEDICAL RECORD NUMBER    Lab Results  Component Value Date   HGBA1C 6.9 (H) 09/01/2021     CURRENT MEDICATIONS: Current Outpatient Medications (Ophthalmic Drugs)  Medication Sig   latanoprost (XALATAN) 0.005 % ophthalmic solution Place 1 drop into both eyes daily.   Propylene Glycol 0.6 % SOLN Place 1 drop into the left eye 2 (two) times daily as needed (for dry eyes).   No current facility-administered medications for this visit. (Ophthalmic Drugs)   Current Outpatient Medications (Other)  Medication Sig   Accu-Chek Softclix Lancets lancets Use to check blood sugar daily DX:E11.59   albuterol (VENTOLIN HFA) 108 (90 Base) MCG/ACT inhaler TAKE 2 PUFFS BY MOUTH EVERY 6 HOURS AS NEEDED FOR WHEEZE OR SHORTNESS OF BREATH (Patient not taking: Reported on 11/07/2021)   amiodarone (PACERONE) 200 MG tablet Take 1 tablet (200 mg total) by mouth daily. Except Sunday.   bisacodyl (DULCOLAX) 5 MG EC tablet Take 5 mg by mouth daily as needed for moderate constipation.   Blood Glucose Monitoring  Suppl (ACCU-CHEK AVIVA PLUS) w/Device KIT Use to check blood sugar daily DX:E11.59   cholecalciferol (VITAMIN D) 1000 units tablet Take 2,000 Units by mouth daily.   docusate sodium (COLACE) 100 MG capsule Take 200 mg by mouth at bedtime.   ELIQUIS 5 MG TABS tablet TAKE 1 TABLET BY MOUTH TWICE A DAY   ferrous sulfate 325 (65 FE) MG tablet Take 1 tablet (325 mg total) by mouth 2 (two) times daily with a meal.   furosemide (LASIX) 40 MG tablet Take 2 tablets (80 mg total) by mouth daily.   gabapentin (NEURONTIN) 100 MG capsule Take 1 capsule (100 mg total) by mouth 3 (three) times daily.   glucose blood (ACCU-CHEK AVIVA PLUS) test strip Use to check blood sugar daily DX:E11.59   metFORMIN (GLUCOPHAGE) 500 MG tablet TAKE 1 TABLET BY MOUTH THREE TIMES A DAY   pantoprazole (PROTONIX) 40 MG tablet TAKE 1 TABLET BY MOUTH EVERY DAY   repaglinide (PRANDIN) 2 MG tablet TAKE 1 TABLET BY MOUTH 3 TIMES DAILY BEFORE MEALS.   simvastatin (ZOCOR) 10 MG tablet TAKE 1 TABLET BY MOUTH EVERY DAY   Tiotropium Bromide-Olodaterol (STIOLTO RESPIMAT) 2.5-2.5 MCG/ACT AERS Inhale 2 puffs into the lungs daily. (Patient not taking: Reported on 11/07/2021)   vitamin  B-12 (CYANOCOBALAMIN) 250 MCG tablet Take 2 tablets (500 mcg total) by mouth daily.   No current facility-administered medications for this visit. (Other)      REVIEW OF SYSTEMS:    ALLERGIES No Known Allergies  PAST MEDICAL HISTORY Past Medical History:  Diagnosis Date   Acute diastolic CHF (congestive heart failure) ([place]) 08/04/2020   Arthritis    Atrial fibrillation ([place])    post op; amiodarone and coumadin continued for 3 mos post op   CARCINOMA, SKIN, SQUAMOUS CELL 01/07/2010   Cataract    beginning of cataracts   COLONIC POLYPS, HX OF 04/26/2007   Coronary artery disease    s/p CABG 3/12: L-LAD, S-CFX (Dr. Roxy Manns); EF 45% at cath prior to CABG   DIABETES MELLITUS, TYPE II 04/26/2007   Hyperlipidemia    HYPERTENSION 04/26/2007    NEPHROLITHIASIS, HX OF 04/26/2007   SBO (small bowel obstruction) ([place]) 10/2015   Partial    Shortness of breath dyspnea    with exertion   Unspecified hearing loss 12/18/2008   Past Surgical History:  Procedure Laterality Date   BACK SURGERY     disectomy   CARDIOVERSION N/A 06/08/2018   Procedure: CARDIOVERSION;  Surgeon: Buford Dresser, MD;  Location: [facility] ENDOSCOPY;  Service: Cardiovascular;  Laterality: N/A;   CARDIOVERSION N/A 07/25/2018   Procedure: CARDIOVERSION;  Surgeon: Fay Records, MD;  Location: [facility];  Service: Cardiovascular;  Laterality: N/A;   COLONOSCOPY     CORONARY ARTERY BYPASS GRAFT  01/12/2011   LUMBAR LAMINECTOMY/DECOMPRESSION MICRODISCECTOMY N/A 05/08/2015   Procedure: LUMBAR LAMINECTOMY/DECOMPRESSION MICRODISCECTOMY 1 LEVEL Lumbar four five;  Surgeon: Melina Schools, MD;  Location: [facility];  Service: Orthopedics;  Laterality: N/A;   TOTAL HIP ARTHROPLASTY Left    left on 04/15/07   TYMPANIC MEMBRANE REPAIR  1981    FAMILY HISTORY Family History  Problem Relation Age of Onset   Heart failure Mother    Heart disease Father    Cancer Sister    Cancer Brother    Heart disease Brother    Breast cancer Sister    Colon cancer Neg Hx    Esophageal cancer Neg Hx    Rectal cancer Neg Hx    Stomach cancer Neg Hx     SOCIAL HISTORY Social History   Tobacco Use   Smoking status: Former    Packs/day: 2.00    Years: 30.00    Pack years: 60.00    Types: Cigarettes    Quit date: 01/02/1981    Years since quitting: 40.8   Smokeless tobacco: Never  Vaping Use   Vaping Use: Never used  Substance Use Topics   Alcohol use: No   Drug use: No         OPHTHALMIC EXAM:  Base Eye Exam     Visual Acuity (ETDRS)       Right Left   Dist [place] CF at 3' 20/60 -1   Dist ph [place]  20/25 -2         Tonometry (Tonopen, 9:59 AM)       Right Left   Pressure 17 17         Pupils       Pupils Dark Light Shape React APD   Right PERRL 2 2 Round Minimal  None   Left PERRL 2 2 Round Minimal None         Visual Fields       Left Right     Full   Restrictions  Partial inner superior temporal, inferior temporal, superior nasal, inferior nasal deficiencies          Extraocular Movement       Right Left    Full, Ortho Full, Ortho         Neuro/Psych     Oriented x3: Yes   Mood/Affect: Normal         Dilation     Both eyes: 1.0% Mydriacyl, 2.5% Phenylephrine @ 9:58 AM           Slit Lamp and Fundus Exam     External Exam       Right Left   External Normal Normal         Slit Lamp Exam       Right Left   Lids/Lashes Normal Normal   Conjunctiva/Sclera White and quiet White and quiet   Cornea Clear Clear   Anterior Chamber Deep and quiet Deep and quiet   Iris Round and reactive Round and reactive   Lens 3+ Nuclear sclerosis Posterior chamber intraocular lens   Anterior Vitreous Normal Normal         Fundus Exam       Right Left   Posterior Vitreous Normal Normal   Disc Normal Normal   C/D Ratio 0.5 0.5   Macula with no new hemorrhage, large RPED, may have a small tear in the RPE Intermediate age related macular degeneration, Soft drusen, no macular thickening, no hemorrhage   Vessels Normal Normal   Periphery Normal Normal            IMAGING AND PROCEDURES  Imaging and Procedures for 11/13/21  OCT, Retina - OU - Both Eyes       Right Eye Quality was good. Scan locations included subfoveal. Central Foveal Thickness: 289. Progression has improved. Findings include abnormal foveal contour, intraretinal fluid, subretinal fluid, pigment epithelial detachment.   Left Eye Quality was good. Scan locations included subfoveal. Central Foveal Thickness: 301. Progression has been stable. Findings include abnormal foveal contour, retinal drusen .   Notes Significant hemorrhagic PED OD.  Subfoveal location.  Wet AMD OD.  Overall much smaller pigment epithelial detachment since onset of therapy June  2022..  With less thickening, we will repeat injection today, and improved overall since onset of therapy June 2022, no residual subretinal fluid currently.  Vision limited by subretinal fibrosis     Intravitreal Injection, Pharmacologic Agent - OD - Right Eye       Time Out 11/13/2021. 10:12 AM. Confirmed correct patient, procedure, site, and patient consented.   Anesthesia Topical anesthesia was used. Anesthetic medications included Lidocaine 4%.   Procedure Preparation included 5% betadine to ocular surface, 10% betadine to eyelids, Ofloxacin , Tobramycin 0.3%. A 30 gauge needle was used.   Injection: 2.5 mg bevacizumab 2.5 MG/0.1ML   Route: Intravitreal, Site: Right Eye   NDC: [phone], Lot: [id number]   Post-op Post injection exam found visual acuity of at least counting fingers. The patient tolerated the procedure well. There were no complications. The patient received written and verbal post procedure care education. Post injection medications included ocuflox.              ASSESSMENT/PLAN:  Snores With review of systems positive for sleep apnea I have urged in fact I have spent 10 minutes talking with the patient informing of the nature and the physiology of untreated sleep apnea and its effect on the CNS and in my opinion macular hypoxia contributing to worsening of age-related  macular degeneration.  For this reason of asked him to follow-up with PCP for home sleep study and if sleep apnea is discovered to undertake all measures to make the treatment work for his long-term benefit     ICD-10-CM   1. Exudative age-related macular degeneration of right eye with active choroidal neovascularization (HCC)  H35.3211 OCT, Retina - OU - Both Eyes    Intravitreal Injection, Pharmacologic Agent - OD - Right Eye    bevacizumab (AVASTIN) SOSY 2.5 mg    2. Snores  R06.83       1.  OD, vastly improved anatomically and preserved acuity on intravitreal Avastin currently at  6-week interval follow-up, 6 months after onset of therapy.  Repeat injection today follow-up next in 7-week  2.  I did discuss at length the patient and family about the consideration having home sleep study.  Very important for this patient to have sleep apnea evaluated and found to be present to treat it so as to prevent nightly hypoxic in the episodic  hyper tension that is concomitant with this disease so as to mitigate and potentially prevent progression of disease and/or development of worsening in the left eye  3.  Ophthalmic Meds Ordered this visit:  Meds ordered this encounter  Medications   bevacizumab (AVASTIN) SOSY 2.5 mg       Return in about 7 weeks (around 01/01/2022) for dilate, OD, AVASTIN OCT.  There are no Patient Instructions on file for this visit.   Explained the diagnoses, plan, and follow up with the patient and they expressed understanding.  Patient expressed understanding of the importance of proper follow up care.   Clent Demark Rashi Giuliani M.D. Diseases & Surgery of the Retina and Vitreous [facility] 11/13/21     Abbreviations: M myopia (nearsighted); A astigmatism; H hyperopia (farsighted); P presbyopia; Mrx spectacle prescription;  CTL contact lenses; OD right eye; OS left eye; OU both eyes  XT exotropia; ET esotropia; PEK punctate epithelial keratitis; PEE punctate epithelial erosions; DES dry eye syndrome; MGD meibomian gland dysfunction; ATs artificial tears; PFAT's preservative free artificial tears; [place] nuclear sclerotic cataract; PSC posterior subcapsular cataract; ERM epi-retinal membrane; PVD posterior vitreous detachment; RD retinal detachment; DM diabetes mellitus; DR diabetic retinopathy; NPDR non-proliferative diabetic retinopathy; PDR proliferative diabetic retinopathy; CSME clinically significant macular edema; DME diabetic macular edema; dbh dot blot hemorrhages; CWS cotton wool spot; POAG primary open angle glaucoma; C/D cup-to-disc  ratio; HVF humphrey visual field; GVF goldmann visual field; OCT optical coherence tomography; IOP intraocular pressure; BRVO Branch retinal vein occlusion; CRVO central retinal vein occlusion; CRAO central retinal artery occlusion; BRAO branch retinal artery occlusion; RT retinal tear; SB scleral buckle; PPV pars plana vitrectomy; VH Vitreous hemorrhage; PRP panretinal laser photocoagulation; IVK intravitreal kenalog; VMT vitreomacular traction; MH Macular hole;  NVD neovascularization of the disc; NVE neovascularization elsewhere; AREDS age related eye disease study; ARMD age related macular degeneration; POAG primary open angle glaucoma; EBMD epithelial/anterior basement membrane dystrophy; ACIOL anterior chamber intraocular lens; IOL intraocular lens; PCIOL posterior chamber intraocular lens; Phaco/IOL phacoemulsification with intraocular lens placement; [place] photorefractive keratectomy; LASIK laser assisted in situ keratomileusis; HTN hypertension; DM diabetes mellitus; COPD chronic obstructive pulmonary disease

## 2021-11-13 NOTE — Assessment & Plan Note (Signed)
With review of systems positive for sleep apnea I have urged in fact I have spent 10 minutes talking with the patient informing of the nature and the physiology of untreated sleep apnea and its effect on the CNS and in my opinion macular hypoxia contributing to worsening of age-related macular degeneration.  For this reason of asked him to follow-up with PCP for home sleep study and if sleep apnea is discovered to undertake all measures to make the treatment work for his long-term benefit

## 2021-11-15 DIAGNOSIS — I2510 Atherosclerotic heart disease of native coronary artery without angina pectoris: Secondary | ICD-10-CM | POA: Diagnosis not present

## 2021-11-15 DIAGNOSIS — I152 Hypertension secondary to endocrine disorders: Secondary | ICD-10-CM | POA: Diagnosis not present

## 2021-11-15 DIAGNOSIS — I480 Paroxysmal atrial fibrillation: Secondary | ICD-10-CM

## 2021-11-15 DIAGNOSIS — E1159 Type 2 diabetes mellitus with other circulatory complications: Secondary | ICD-10-CM | POA: Diagnosis not present

## 2021-11-20 DIAGNOSIS — H25811 Combined forms of age-related cataract, right eye: Secondary | ICD-10-CM | POA: Diagnosis not present

## 2021-11-20 DIAGNOSIS — H353122 Nonexudative age-related macular degeneration, left eye, intermediate dry stage: Secondary | ICD-10-CM | POA: Diagnosis not present

## 2021-11-20 DIAGNOSIS — H353211 Exudative age-related macular degeneration, right eye, with active choroidal neovascularization: Secondary | ICD-10-CM | POA: Diagnosis not present

## 2021-11-20 DIAGNOSIS — H40021 Open angle with borderline findings, high risk, right eye: Secondary | ICD-10-CM | POA: Diagnosis not present

## 2021-11-20 DIAGNOSIS — H401121 Primary open-angle glaucoma, left eye, mild stage: Secondary | ICD-10-CM | POA: Diagnosis not present

## 2021-12-02 ENCOUNTER — Encounter (HOSPITAL_COMMUNITY): Payer: Self-pay

## 2021-12-02 ENCOUNTER — Emergency Department (HOSPITAL_COMMUNITY)
Admission: EM | Admit: 2021-12-02 | Discharge: 2021-12-03 | Disposition: A | Discharge status: Home / Self Care | Payer: Medicare Other | Attending: Emergency Medicine | Admitting: Emergency Medicine

## 2021-12-02 ENCOUNTER — Other Ambulatory Visit: Payer: Self-pay

## 2021-12-02 DIAGNOSIS — E119 Type 2 diabetes mellitus without complications: Secondary | ICD-10-CM | POA: Insufficient documentation

## 2021-12-02 DIAGNOSIS — I509 Heart failure, unspecified: Secondary | ICD-10-CM | POA: Insufficient documentation

## 2021-12-02 DIAGNOSIS — Z87442 Personal history of urinary calculi: Secondary | ICD-10-CM | POA: Insufficient documentation

## 2021-12-02 DIAGNOSIS — R110 Nausea: Secondary | ICD-10-CM | POA: Insufficient documentation

## 2021-12-02 DIAGNOSIS — I110 Hypertensive heart disease with heart failure: Secondary | ICD-10-CM | POA: Diagnosis not present

## 2021-12-02 DIAGNOSIS — R1084 Generalized abdominal pain: Secondary | ICD-10-CM | POA: Diagnosis not present

## 2021-12-02 DIAGNOSIS — Z7901 Long term (current) use of anticoagulants: Secondary | ICD-10-CM | POA: Diagnosis not present

## 2021-12-02 DIAGNOSIS — Z7984 Long term (current) use of oral hypoglycemic drugs: Secondary | ICD-10-CM | POA: Diagnosis not present

## 2021-12-02 DIAGNOSIS — R1031 Right lower quadrant pain: Secondary | ICD-10-CM | POA: Diagnosis present

## 2021-12-02 DIAGNOSIS — R109 Unspecified abdominal pain: Secondary | ICD-10-CM | POA: Diagnosis not present

## 2021-12-02 DIAGNOSIS — I700 Atherosclerosis of aorta: Secondary | ICD-10-CM | POA: Diagnosis not present

## 2021-12-02 NOTE — ED Provider Triage Note (Signed)
Emergency Medicine Provider Triage Evaluation Note  LYNDA CAPISTRAN , a 87 y.o. male  was evaluated in triage.  Pt complains of gradual onset, constant, achy, periumbilical abdominal pain that began around 6 PM tonight.  Patient states that he thinks he ate too much chocolate.  He states that this is happened to him before.  He admits eating quite a bit of candy over the past several weeks and attributes this to his abdominal pain.  He states he is somewhat nauseated.  Denies any vomiting.  He last had a bowel movement earlier today without any issues.  No fevers or chills.  Review of Systems  Positive: + abd pain, nausea Negative: - constipation, diarrhea, fevers, chills, vomiting  Physical Exam  BP (!) 182/80 (BP Location: Right Arm)    Pulse 75    Temp (!) 97.4 F (36.3 C) (Oral)    Resp 16    Ht 5\' 8"  (1.727 m)    Wt 103 kg    SpO2 98%    BMI 34.52 kg/m  Gen:   Awake, no distress   Resp:  Normal effort  MSK:   Moves extremities without difficulty  Other:  Mild abd TTP  Medical Decision Making  Medically screening exam initiated at 11:22 PM.  Appropriate orders placed.  MICCO BOURBEAU was informed that the remainder of the evaluation will be completed by another provider, this initial triage assessment does not replace that evaluation, and the importance of remaining in the ED until their evaluation is complete.     Eustaquio Maize, PA-C 12/02/21 [id number]

## 2021-12-02 NOTE — ED Triage Notes (Signed)
Pt states that he has eaten too much chocolate over the past 2 weeks and now has a stomach ache.

## 2021-12-03 ENCOUNTER — Emergency Department (HOSPITAL_COMMUNITY): Payer: Medicare Other

## 2021-12-03 DIAGNOSIS — I700 Atherosclerosis of aorta: Secondary | ICD-10-CM | POA: Diagnosis not present

## 2021-12-03 DIAGNOSIS — R109 Unspecified abdominal pain: Secondary | ICD-10-CM | POA: Diagnosis not present

## 2021-12-03 LAB — CBC WITH DIFFERENTIAL/PLATELET
Abs Immature Granulocytes: 0.06 10*3/uL (ref 0.00–0.07)
Basophils Absolute: 0.1 10*3/uL (ref 0.0–0.1)
Basophils Relative: 1 %
Eosinophils Absolute: 0.2 10*3/uL (ref 0.0–0.5)
Eosinophils Relative: 2 %
HCT: 44.3 % (ref 39.0–52.0)
Hemoglobin: 15.2 g/dL (ref 13.0–17.0)
Immature Granulocytes: 1 %
Lymphocytes Relative: 20 %
Lymphs Abs: 1.8 10*3/uL (ref 0.7–4.0)
MCH: 32.3 pg (ref 26.0–34.0)
MCHC: 34.3 g/dL (ref 30.0–36.0)
MCV: 94.1 fL (ref 80.0–100.0)
Monocytes Absolute: 0.8 10*3/uL (ref 0.1–1.0)
Monocytes Relative: 9 %
Neutro Abs: 6.1 10*3/uL (ref 1.7–7.7)
Neutrophils Relative %: 67 %
Platelets: 244 10*3/uL (ref 150–400)
RBC: 4.71 MIL/uL (ref 4.22–5.81)
RDW: 12.8 % (ref 11.5–15.5)
WBC: 8.9 10*3/uL (ref 4.0–10.5)
nRBC: 0 % (ref 0.0–0.2)

## 2021-12-03 LAB — COMPREHENSIVE METABOLIC PANEL
ALT: 24 U/L (ref 0–44)
AST: 25 U/L (ref 15–41)
Albumin: 4.3 g/dL (ref 3.5–5.0)
Alkaline Phosphatase: 50 U/L (ref 38–126)
Anion gap: 11 (ref 5–15)
BUN: 18 mg/dL (ref 8–23)
CO2: 24 mmol/L (ref 22–32)
Calcium: 9.6 mg/dL (ref 8.9–10.3)
Chloride: 104 mmol/L (ref 98–111)
Creatinine, Ser: 1.21 mg/dL (ref 0.61–1.24)
GFR, Estimated: 59 mL/min — ABNORMAL LOW (ref >60)
Glucose, Bld: 185 mg/dL — ABNORMAL HIGH (ref 70–99)
Potassium: 3.9 mmol/L (ref 3.5–5.1)
Sodium: 139 mmol/L (ref 135–145)
Total Bilirubin: 0.8 mg/dL (ref 0.3–1.2)
Total Protein: 7.7 g/dL (ref 6.5–8.1)

## 2021-12-03 LAB — LIPASE, BLOOD: Lipase: 32 U/L (ref 11–51)

## 2021-12-03 MED ORDER — ONDANSETRON 4 MG PO TBDP
4.0000 mg | ORAL_TABLET | Freq: Once | ORAL | Status: AC
  Administered 2021-12-03: 4 mg via ORAL
  Filled 2021-12-03: qty 1

## 2021-12-03 MED ORDER — SODIUM CHLORIDE 0.9 % IV BOLUS: 500.0000 mL | Freq: Once | INTRAVENOUS | Status: DC

## 2021-12-03 MED ORDER — SODIUM CHLORIDE (PF) 0.9 % IJ SOLN
INTRAMUSCULAR | Status: AC
  Filled 2021-12-03: qty 50

## 2021-12-03 MED ORDER — SODIUM CHLORIDE 0.9 % IV BOLUS
500.0000 mL | Freq: Once | INTRAVENOUS | Status: AC
  Administered 2021-12-03: 500 mL via INTRAVENOUS

## 2021-12-03 MED ORDER — MORPHINE SULFATE (PF) 4 MG/ML IV SOLN
4.0000 mg | Freq: Once | INTRAVENOUS | Status: AC
  Administered 2021-12-03: 4 mg via INTRAVENOUS
  Filled 2021-12-03: qty 1

## 2021-12-03 MED ORDER — IOHEXOL 300 MG/ML  SOLN
100.0000 mL | Freq: Once | INTRAMUSCULAR | Status: AC | PRN
  Administered 2021-12-03: 100 mL via INTRAVENOUS

## 2021-12-03 NOTE — ED Provider Notes (Signed)
[facility] DEPT Provider Note   CSN: [id number] Arrival date & time: 12/02/21  2307     History  Chief Complaint  Patient presents with   Abdominal Pain    [name] is a 87 y.o. male.  87 year old male with past medical history of CHF (EF 60-65% on echo 04/07/21), A. fib (on Eliquis), kidney stones, hypertension and diabetes with additional history reviewed with patient presents with complaint of abdominal pain.  Pain started 24 hours ago, relates to eating a large amount of chocolate which he has been doing for the past few weeks.  States that this happened in the past after eating a lot of chocolate and resulted in inflammation in his intestine.  Reports nausea without vomiting, denies changes in bowel or bladder habits, denies chest pain or shortness of breath, fevers, chills.  Denies prior abdominal surgeries.  Pain waxes and wanes in intensity but is always present, primarily right lower quadrant, unable to describe otherwise.      Home Medications Prior to Admission medications   Medication Sig Start Date End Date Taking? Authorizing Provider  Accu-Chek Softclix Lancets lancets Use to check blood sugar daily DX:E11.59 11/01/19   [name], Evie Lacks, MD  albuterol (VENTOLIN HFA) 108 (90 Base) MCG/ACT inhaler TAKE 2 PUFFS BY MOUTH EVERY 6 HOURS AS NEEDED FOR WHEEZE OR SHORTNESS OF BREATH Patient not taking: Reported on 11/07/2021 10/21/20   Collene Gobble, MD  amiodarone (PACERONE) 200 MG tablet Take 1 tablet (200 mg total) by mouth daily. Except Sunday. 01/06/21   Fay Records, MD  bisacodyl (DULCOLAX) 5 MG EC tablet Take 5 mg by mouth daily as needed for moderate constipation.    [provider]  Blood Glucose Monitoring Suppl (ACCU-CHEK AVIVA PLUS) w/Device KIT Use to check blood sugar daily DX:E11.59 11/01/19   [name], Evie Lacks, MD  cholecalciferol (VITAMIN D) 1000 units tablet Take 2,000 Units by mouth daily.    [provider]  docusate sodium (COLACE) 100 MG capsule Take 200 mg by mouth at bedtime.    [provider]  ELIQUIS 5 MG TABS tablet TAKE 1 TABLET BY MOUTH TWICE A DAY 04/10/21   Fay Records, MD  ferrous sulfate 325 (65 FE) MG tablet Take 1 tablet (325 mg total) by mouth 2 (two) times daily with a meal. 04/04/21   Fay Records, MD  furosemide (LASIX) 40 MG tablet Take 2 tablets (80 mg total) by mouth daily. 10/14/21   Fay Records, MD  gabapentin (NEURONTIN) 100 MG capsule Take 1 capsule (100 mg total) by mouth 3 (three) times daily. 04/21/21   [name], Evie Lacks, MD  glucose blood (ACCU-CHEK AVIVA PLUS) test strip Use to check blood sugar daily DX:E11.59 10/27/21   [name], Evie Lacks, MD  latanoprost (XALATAN) 0.005 % ophthalmic solution Place 1 drop into both eyes daily. 05/13/20   [provider]  metFORMIN (GLUCOPHAGE) 500 MG tablet TAKE 1 TABLET BY MOUTH THREE TIMES A DAY 02/26/21   [name], Evie Lacks, MD  pantoprazole (PROTONIX) 40 MG tablet TAKE 1 TABLET BY MOUTH EVERY DAY 02/24/21   [name], Evie Lacks, MD  Propylene Glycol 0.6 % SOLN Place 1 drop into the left eye 2 (two) times daily as needed (for dry eyes).    [provider]  repaglinide (PRANDIN) 2 MG tablet TAKE 1 TABLET BY MOUTH 3 TIMES DAILY BEFORE MEALS. 10/27/21   [name], Evie Lacks, MD  simvastatin (ZOCOR) 10 MG  tablet TAKE 1 TABLET BY MOUTH EVERY DAY 01/07/21   Fay Records, MD  Tiotropium Bromide-Olodaterol (STIOLTO RESPIMAT) 2.5-2.5 MCG/ACT AERS Inhale 2 puffs into the lungs daily. Patient not taking: Reported on 11/07/2021 07/02/20   Martyn Ehrich, NP  vitamin B-12 (CYANOCOBALAMIN) 250 MCG tablet Take 2 tablets (500 mcg total) by mouth daily. 04/04/21   Fay Records, MD      Allergies    Patient has no known allergies.    Review of Systems   Review of Systems  Constitutional:  Negative for chills and fever.  Respiratory:  Negative for shortness of breath.   Cardiovascular:   Negative for chest pain.  Gastrointestinal:  Positive for abdominal pain and nausea. Negative for constipation, diarrhea and vomiting.  Genitourinary:  Negative for dysuria and flank pain.  Musculoskeletal:  Negative for arthralgias and myalgias.  Skin:  Negative for rash and wound.  Allergic/Immunologic: Positive for immunocompromised state.  Neurological:  Negative for weakness.  Hematological:  Negative for adenopathy.  Psychiatric/Behavioral:  Negative for confusion.   All other systems reviewed and are negative.  Physical Exam Updated Vital Signs BP (!) 168/80    Pulse 65    Temp (!) 97.4 F (36.3 C) (Oral)    Resp 18    Ht 5' 8" (1.727 m)    Wt 103 kg    SpO2 94%    BMI 34.52 kg/m  Physical Exam Vitals and nursing note reviewed.  Constitutional:      General: He is not in acute distress.    Appearance: He is well-developed. He is not diaphoretic.  HENT:     Head: Normocephalic and atraumatic.  Cardiovascular:     Rate and Rhythm: Normal rate and regular rhythm.     Heart sounds: Normal heart sounds.  Pulmonary:     Effort: Pulmonary effort is normal.     Breath sounds: Normal breath sounds.  Abdominal:     Palpations: Abdomen is soft.     Tenderness: There is generalized abdominal tenderness.  Skin:    General: Skin is warm and dry.  Neurological:     Mental Status: He is alert and oriented to person, place, and time.  Psychiatric:        Behavior: Behavior normal.    ED Results / Procedures / Treatments   Labs (all labs ordered are listed, but only abnormal results are displayed) Labs Reviewed  COMPREHENSIVE METABOLIC PANEL - Abnormal; Notable for the following components:      Result Value   Glucose, Bld 185 (*)    GFR, Estimated 59 (*)    All other components within normal limits  LIPASE, BLOOD  CBC WITH DIFFERENTIAL/PLATELET  URINALYSIS, ROUTINE W REFLEX MICROSCOPIC    EKG None  Radiology CT Abdomen Pelvis W Contrast  Result Date:  12/03/2021 CLINICAL DATA:  Abdominal pain, acute nonlocalized. EXAM: CT ABDOMEN AND PELVIS WITH CONTRAST TECHNIQUE: Multidetector CT imaging of the abdomen and pelvis was performed using the standard protocol following bolus administration of intravenous contrast. RADIATION DOSE REDUCTION: This exam was performed according to the departmental dose-optimization program which includes automated exposure control, adjustment of the mA and/or kV according to patient size and/or use of iterative reconstruction technique. CONTRAST:  133m OMNIPAQUE IOHEXOL 300 MG/ML  SOLN COMPARISON:  August 20, 2020 FINDINGS: Lower chest: Similar mild subpleural reticulations and bronchiectasis at the lung bases. Prior median sternotomy. Coronary artery calcifications/stents. No acute abnormality. Hepatobiliary: No suspicious hepatic lesion. Gallbladder is unremarkable. No biliary  ductal dilation. Pancreas: No pancreatic ductal dilation or evidence of acute inflammation. Spleen: Normal in size without focal abnormality. Adrenals/Urinary Tract: Bilateral adrenal glands appear normal. Subcentimeter hypodense left interpolar renal lesion is technically too small to accurately characterize but statistically likely to reflect a cyst. No solid enhancing renal lesions. Kidneys demonstrate symmetric enhancement and excretion of contrast material. Urinary bladder is unremarkable for degree of distension. Stomach/Bowel: No enteric contrast was administered. Stomach is unremarkable for degree of distension. No pathologic dilation of small or large bowel. Terminal ileum appears normal. The appendix is not confidently identified however there is no pericecal inflammation. Scattered colonic diverticulosis without findings of acute diverticulitis. No suspicious colonic wall thickening or mass like lesions identified. Vascular/Lymphatic: Aortic and branch vessel atherosclerosis without abdominal aortic aneurysm. No pathologically enlarged abdominal or  pelvic lymph nodes. Reproductive: Dystrophic prostatic calcifications. Other: No significant abdominopelvic free fluid. No pneumoperitoneum. Musculoskeletal: Left hip arthroplasty. Multilevel degenerative changes spine with grade 1 L4 on L5 degenerative anterolisthesis. Right hip degenerative change. No acute osseous abnormality. IMPRESSION: 1. No acute abdominopelvic findings. 2. Stable ancillary findings further described above. 3.  Aortic Atherosclerosis (ICD10-I70.0). Electronically Signed   By: Dahlia Bailiff M.D.   On: 12/03/2021 10:02    Procedures Procedures    Medications Ordered in ED Medications  morphine 4 MG/ML injection 4 mg (4 mg Intravenous Given 12/03/21 0831)  ondansetron (ZOFRAN-ODT) disintegrating tablet 4 mg (4 mg Oral Given 12/03/21 0832)  sodium chloride 0.9 % bolus 500 mL (0 mLs Intravenous Stopped 12/03/21 0926)  sodium chloride (PF) 0.9 % injection (  Given 12/03/21 0945)  iohexol (OMNIPAQUE) 300 MG/ML solution 100 mL (100 mLs Intravenous Contrast Given 12/03/21 0932)    ED Course/ Medical Decision Making/ A&P                           Medical Decision Making Amount and/or Complexity of Data Reviewed Radiology: ordered.  Risk Prescription drug management.   This patient presents to the ED for concern of abdominal pain, this involves an extensive number of treatment options, and is a complaint that carries with it a high risk of complications and morbidity.  The differential diagnosis includes but not limited to appendicitis, small bowel obstruction, acute cholecystitis, diverticulitis.   Co morbidities that complicate the patient evaluation  A. fib on Eliquis, CHF, hyperlipidemia, diabetes   Additional history obtained:  Additional history obtained from recent PCP visit on 09/01/21 External records from outside source obtained and reviewed including PCP 09/01/21 office visit   Lab Tests:  I Ordered, and personally interpreted labs.  The pertinent results  include: CBC, CMP, lipase.  Glucose is elevated at 185 otherwise labs are unremarkable.   Imaging Studies ordered:  I ordered imaging studies including CT abdomen pelvis with contrast I agree with the radiologist interpretation no acute abdominal pelvic findings   Medicines ordered and prescription drug management:  I ordered medication including Zofran and morphine, IV fluids for pain and hydration due to contrasted CT study Reevaluation of the patient after these medicines showed that the patient improved I have reviewed the patients home medicines and have made adjustments as needed   Critical Interventions:  None   Consultations Obtained:  I requested consultation with the Dr. Eulis Foster, ER attending,  and discussed lab and imaging findings as well as pertinent plan - they recommend: Plan for discharge home.   Problem List / ED Course:  87 year old male presents with complaint of  abdominal pain which she feels is related to eating too much chocolate recently.  He reports history of similar symptoms previously resulting in inflammation of his colon.  Concerned he has developed the same.  On exam, he has generalized abdominal tenderness without guarding or rebound. Labs are reviewed and reassuring including CBC chemistry with elevated glucose to 185, lipase is normal.  CT without acute findings to explain patient's pain today.  On recheck, patient is feeling better.  Patient is encouraged to follow-up with his provider, return to ER for worsening or concerning symptoms.   Reevaluation:  After the interventions noted above, I reevaluated the patient and found that they have :improved   Dispostion:  After consideration of the diagnostic results and the patients response to treatment, I feel that the patent would benefit from recheck with primary care provider, return to ED for worsening or concerning symptoms.          Final Clinical Impression(s) / ED Diagnoses Final  diagnoses:  Generalized abdominal pain    Rx / DC Orders ED Discharge Orders     None         Tacy Learn, PA-C 12/03/21 1500    Daleen Bo, MD 12/03/21 1727

## 2021-12-03 NOTE — ED Provider Notes (Signed)
°  Face-to-face evaluation   History: He presents for evaluation abdominal pain after eating chocolate yesterday.  He denies nausea, vomiting, diarrhea, weakness or dizziness.  Physical exam: Alert elderly male who is calm comfortable.  Abdomen soft nontender to palpation.  He is lucid.  MDM: Evaluation for  Chief Complaint  Patient presents with   Abdominal Pain     Abdominal pain after eating chocolate, with reassuring vital signs on admission, and normal evaluation in the ED.  Mild hypertension is present at time of discharge.  He has a known history of hypertension and diabetes.  He is on appropriate medications for his chronic medical problems.  Medical screening examination/treatment/procedure(s) were conducted as a shared visit with non-physician practitioner(s) and myself.  I personally evaluated the patient during the encounter    Daleen Bo, MD 12/03/21 1727

## 2021-12-03 NOTE — Discharge Instructions (Addendum)
Follow-up with your doctor for recheck.  Return to the emergency room for any worsening or concerning symptoms.  Recommend bland diet until symptoms improve.

## 2021-12-04 ENCOUNTER — Ambulatory Visit (INDEPENDENT_AMBULATORY_CARE_PROVIDER_SITE_OTHER): Payer: Medicare Other | Admitting: Internal Medicine

## 2021-12-04 ENCOUNTER — Other Ambulatory Visit: Payer: Self-pay

## 2021-12-04 ENCOUNTER — Encounter: Payer: Self-pay | Admitting: Internal Medicine

## 2021-12-04 DIAGNOSIS — I2510 Atherosclerotic heart disease of native coronary artery without angina pectoris: Secondary | ICD-10-CM | POA: Diagnosis not present

## 2021-12-04 DIAGNOSIS — I480 Paroxysmal atrial fibrillation: Secondary | ICD-10-CM | POA: Diagnosis not present

## 2021-12-04 DIAGNOSIS — E1159 Type 2 diabetes mellitus with other circulatory complications: Secondary | ICD-10-CM | POA: Diagnosis not present

## 2021-12-04 DIAGNOSIS — R1033 Periumbilical pain: Secondary | ICD-10-CM | POA: Diagnosis not present

## 2021-12-04 NOTE — Assessment & Plan Note (Signed)
Cont on Metformin, Prandin 

## 2021-12-04 NOTE — Assessment & Plan Note (Signed)
Cont on amiodarone, Eliquis, Paceron, Toprol

## 2021-12-04 NOTE — Assessment & Plan Note (Signed)
S/p ER visit f/u on 1/17 for abd pain. Abd CT was ok. He overate chocolate, peanuts - ?etiology Per hx: "Abdominal pain after eating chocolate, with reassuring vital signs on admission, and normal evaluation in the ED.  Mild hypertension is present at time of discharge.  He has a known history of hypertension and diabetes.  He is on appropriate medications for his chronic medical problems."  Resolving

## 2021-12-04 NOTE — Progress Notes (Signed)
Subjective:  Patient ID: [name], male    DOB: 09/02/1935  Age: 87 y.o. MRN: [id number]  CC: No chief complaint on file.   HPI [name] presents for ER visit f/u on 1/17 for abd pain. Abd CT was ok. Given MS inj  Per hx: "Abdominal pain after eating chocolate, with reassuring vital signs on admission, and normal evaluation in the ED.  Mild hypertension is present at time of discharge.  He has a known history of hypertension and diabetes.  He is on appropriate medications for his chronic medical problems."  F/u CAD, DM  Outpatient Medications Prior to Visit  Medication Sig Dispense Refill   Accu-Chek Softclix Lancets lancets Use to check blood sugar daily DX:E11.59 100 each 3   amiodarone (PACERONE) 200 MG tablet Take 1 tablet (200 mg total) by mouth daily. Except Sunday. 90 tablet 3   bisacodyl (DULCOLAX) 5 MG EC tablet Take 5 mg by mouth daily as needed for moderate constipation.     Blood Glucose Monitoring Suppl (ACCU-CHEK AVIVA PLUS) w/Device KIT Use to check blood sugar daily DX:E11.59 1 kit 0   cholecalciferol (VITAMIN D) 1000 units tablet Take 2,000 Units by mouth daily.     docusate sodium (COLACE) 100 MG capsule Take 200 mg by mouth at bedtime.     ELIQUIS 5 MG TABS tablet TAKE 1 TABLET BY MOUTH TWICE A DAY 60 tablet 5   ferrous sulfate 325 (65 FE) MG tablet Take 1 tablet (325 mg total) by mouth 2 (two) times daily with a meal. 60 tablet 6   furosemide (LASIX) 40 MG tablet Take 2 tablets (80 mg total) by mouth daily. 180 tablet 3   gabapentin (NEURONTIN) 100 MG capsule Take 1 capsule (100 mg total) by mouth 3 (three) times daily. 270 capsule 3   glucose blood (ACCU-CHEK AVIVA PLUS) test strip Use to check blood sugar daily DX:E11.59 100 each 3   latanoprost (XALATAN) 0.005 % ophthalmic solution Place 1 drop into both eyes daily.     metFORMIN (GLUCOPHAGE) 500 MG tablet TAKE 1 TABLET BY MOUTH THREE TIMES A DAY 270 tablet 3   pantoprazole (PROTONIX) 40 MG  tablet TAKE 1 TABLET BY MOUTH EVERY DAY 90 tablet 3   Propylene Glycol 0.6 % SOLN Place 1 drop into the left eye 2 (two) times daily as needed (for dry eyes).     repaglinide (PRANDIN) 2 MG tablet TAKE 1 TABLET BY MOUTH 3 TIMES DAILY BEFORE MEALS. 270 tablet 3   simvastatin (ZOCOR) 10 MG tablet TAKE 1 TABLET BY MOUTH EVERY DAY 90 tablet 3   vitamin B-12 (CYANOCOBALAMIN) 250 MCG tablet Take 2 tablets (500 mcg total) by mouth daily.     albuterol (VENTOLIN HFA) 108 (90 Base) MCG/ACT inhaler TAKE 2 PUFFS BY MOUTH EVERY 6 HOURS AS NEEDED FOR WHEEZE OR SHORTNESS OF BREATH (Patient not taking: Reported on 11/07/2021) 6.7 each 2   Tiotropium Bromide-Olodaterol (STIOLTO RESPIMAT) 2.5-2.5 MCG/ACT AERS Inhale 2 puffs into the lungs daily. (Patient not taking: Reported on 11/07/2021) 4 g 0   No facility-administered medications prior to visit.    ROS: Review of Systems  Constitutional:  Negative for appetite change, fatigue and unexpected weight change.  HENT:  Negative for congestion, nosebleeds, sneezing, sore throat and trouble swallowing.   Eyes:  Negative for itching and visual disturbance.  Respiratory:  Negative for cough.   Cardiovascular:  Negative for chest pain, palpitations and leg swelling.  Gastrointestinal:  Positive for  nausea. Negative for abdominal distention, blood in stool and diarrhea.  Genitourinary:  Negative for frequency and hematuria.  Musculoskeletal:  Negative for back pain, gait problem, joint swelling and neck pain.  Skin:  Negative for rash.  Neurological:  Negative for dizziness, tremors, speech difficulty and weakness.  Psychiatric/Behavioral:  Negative for agitation, dysphoric mood and sleep disturbance. The patient is not nervous/anxious.    Objective:  BP 130/62 (BP Location: Left Arm, Patient Position: Sitting, Cuff Size: Large)    Pulse 77    Temp 98.3 F (36.8 C) (Oral)    Ht '5\' 8"'  (1.727 m)    Wt 226 lb (102.5 kg)    SpO2 91%    BMI 34.36 kg/m   BP Readings  from Last 3 Encounters:  12/04/21 130/62  12/03/21 (!) 168/80  09/01/21 140/72    Wt Readings from Last 3 Encounters:  12/04/21 226 lb (102.5 kg)  12/02/21 227 lb (103 kg)  09/01/21 226 lb (102.5 kg)    Physical Exam Constitutional:      General: He is not in acute distress.    Appearance: He is well-developed. He is obese. He is not ill-appearing or toxic-appearing.     Comments: NAD  Eyes:     Conjunctiva/sclera: Conjunctivae normal.     Pupils: Pupils are equal, round, and reactive to light.  Neck:     Thyroid: No thyromegaly.     Vascular: No JVD.  Cardiovascular:     Rate and Rhythm: Normal rate and regular rhythm.     Heart sounds: Normal heart sounds. No murmur heard.   No friction rub. No gallop.  Pulmonary:     Effort: Pulmonary effort is normal. No respiratory distress.     Breath sounds: Normal breath sounds. No wheezing or rales.  Chest:     Chest wall: No tenderness.  Abdominal:     General: Bowel sounds are normal. There is no distension.     Palpations: Abdomen is soft. There is no mass.     Tenderness: There is no abdominal tenderness. There is no guarding or rebound.  Musculoskeletal:        General: No tenderness. Normal range of motion.     Cervical back: Normal range of motion.  Lymphadenopathy:     Cervical: No cervical adenopathy.  Skin:    General: Skin is warm and dry.     Findings: No rash.  Neurological:     Mental Status: He is alert and oriented to person, place, and time.     Cranial Nerves: No cranial nerve deficit.     Motor: No abnormal muscle tone.     Coordination: Coordination normal.     Gait: Gait normal.     Deep Tendon Reflexes: Reflexes are normal and symmetric.  Psychiatric:        Behavior: Behavior normal.        Thought Content: Thought content normal.        Judgment: Judgment normal.    Lab Results  Component Value Date   WBC 8.9 12/02/2021   HGB 15.2 12/02/2021   HCT 44.3 12/02/2021   PLT 244 12/02/2021    GLUCOSE 185 (H) 12/02/2021   CHOL 126 08/09/2020   TRIG 149 08/09/2020   HDL 30 (L) 08/09/2020   LDLCALC 70 08/09/2020   ALT 24 12/02/2021   AST 25 12/02/2021   NA 139 12/02/2021   K 3.9 12/02/2021   CL 104 12/02/2021   CREATININE 1.21 12/02/2021  BUN 18 12/02/2021   CO2 24 12/02/2021   TSH 3.740 08/09/2020   PSA 1.18 07/02/2017   INR 2.3 03/03/2012   HGBA1C 6.9 (H) 09/01/2021   MICROALBUR 0.7 04/04/2018    CT Abdomen Pelvis W Contrast  Result Date: 12/03/2021 CLINICAL DATA:  Abdominal pain, acute nonlocalized. EXAM: CT ABDOMEN AND PELVIS WITH CONTRAST TECHNIQUE: Multidetector CT imaging of the abdomen and pelvis was performed using the standard protocol following bolus administration of intravenous contrast. RADIATION DOSE REDUCTION: This exam was performed according to the departmental dose-optimization program which includes automated exposure control, adjustment of the mA and/or kV according to patient size and/or use of iterative reconstruction technique. CONTRAST:  157m OMNIPAQUE IOHEXOL 300 MG/ML  SOLN COMPARISON:  August 20, 2020 FINDINGS: Lower chest: Similar mild subpleural reticulations and bronchiectasis at the lung bases. Prior median sternotomy. Coronary artery calcifications/stents. No acute abnormality. Hepatobiliary: No suspicious hepatic lesion. Gallbladder is unremarkable. No biliary ductal dilation. Pancreas: No pancreatic ductal dilation or evidence of acute inflammation. Spleen: Normal in size without focal abnormality. Adrenals/Urinary Tract: Bilateral adrenal glands appear normal. Subcentimeter hypodense left interpolar renal lesion is technically too small to accurately characterize but statistically likely to reflect a cyst. No solid enhancing renal lesions. Kidneys demonstrate symmetric enhancement and excretion of contrast material. Urinary bladder is unremarkable for degree of distension. Stomach/Bowel: No enteric contrast was administered. Stomach is unremarkable  for degree of distension. No pathologic dilation of small or large bowel. Terminal ileum appears normal. The appendix is not confidently identified however there is no pericecal inflammation. Scattered colonic diverticulosis without findings of acute diverticulitis. No suspicious colonic wall thickening or mass like lesions identified. Vascular/Lymphatic: Aortic and branch vessel atherosclerosis without abdominal aortic aneurysm. No pathologically enlarged abdominal or pelvic lymph nodes. Reproductive: Dystrophic prostatic calcifications. Other: No significant abdominopelvic free fluid. No pneumoperitoneum. Musculoskeletal: Left hip arthroplasty. Multilevel degenerative changes spine with grade 1 L4 on L5 degenerative anterolisthesis. Right hip degenerative change. No acute osseous abnormality. IMPRESSION: 1. No acute abdominopelvic findings. 2. Stable ancillary findings further described above. 3.  Aortic Atherosclerosis (ICD10-I70.0). Electronically Signed   By: JDahlia BailiffM.D.   On: 12/03/2021 10:02    Assessment & Plan:   Problem List Items Addressed This Visit     CAD (coronary artery disease), native coronary artery (Chronic)    No CP.  Cont w/Eliquis, Amlodipine, Lopressor, Zocor       Diabetes mellitus, type 2 (HCC) (Chronic)    Cont on Metformin, Prandin      Paroxysmal atrial fibrillation (HCC) (Chronic)    Cont on amiodarone, Eliquis, Paceron, Toprol       Abdominal pain    S/p ER visit f/u on 1/17 for abd pain. Abd CT was ok. He overate chocolate, peanuts - ?etiology Per hx: "Abdominal pain after eating chocolate, with reassuring vital signs on admission, and normal evaluation in the ED.  Mild hypertension is present at time of discharge.  He has a known history of hypertension and diabetes.  He is on appropriate medications for his chronic medical problems."  Resolving         No orders of the defined types were placed in this encounter.     Follow-up: Return in  about 3 months (around 03/04/2022) for a follow-up visit.  AWalker Kehr MD

## 2021-12-04 NOTE — Assessment & Plan Note (Signed)
No CP.  Cont w/Eliquis, Amlodipine, Lopressor, Zocor

## 2021-12-31 ENCOUNTER — Encounter (INDEPENDENT_AMBULATORY_CARE_PROVIDER_SITE_OTHER): Payer: Self-pay | Admitting: Ophthalmology

## 2021-12-31 ENCOUNTER — Ambulatory Visit (INDEPENDENT_AMBULATORY_CARE_PROVIDER_SITE_OTHER): Payer: Medicare Other | Admitting: Ophthalmology

## 2021-12-31 ENCOUNTER — Other Ambulatory Visit: Payer: Self-pay

## 2021-12-31 DIAGNOSIS — H2511 Age-related nuclear cataract, right eye: Secondary | ICD-10-CM

## 2021-12-31 DIAGNOSIS — H353211 Exudative age-related macular degeneration, right eye, with active choroidal neovascularization: Secondary | ICD-10-CM | POA: Diagnosis not present

## 2021-12-31 DIAGNOSIS — H353132 Nonexudative age-related macular degeneration, bilateral, intermediate dry stage: Secondary | ICD-10-CM

## 2021-12-31 DIAGNOSIS — H35372 Puckering of macula, left eye: Secondary | ICD-10-CM

## 2021-12-31 MED ORDER — BEVACIZUMAB 2.5 MG/0.1ML IZ SOSY
2.5000 mg | PREFILLED_SYRINGE | INTRAVITREAL | Status: AC | PRN
  Administered 2021-12-31: 2.5 mg via INTRAVITREAL

## 2021-12-31 NOTE — Progress Notes (Signed)
12/31/2021     CHIEF COMPLAINT Patient presents for  Chief Complaint  Patient presents with   Macular Degeneration      HISTORY OF PRESENT ILLNESS: [name] is a 87 y.o. male who presents to the clinic today for:   HPI   History of wet AMD OD today follow-up visit at 76 weeks.  No interval change in vision either better or worse in the right eye. Last edited by Hurman Horn, MD on 12/31/2021  8:21 AM.      Referring physician: Cassandria Anger, MD [place],  [place] [id number]  HISTORICAL INFORMATION:   Selected notes from the MEDICAL RECORD NUMBER    Lab Results  Component Value Date   HGBA1C 6.9 (H) 09/01/2021     CURRENT MEDICATIONS: Current Outpatient Medications (Ophthalmic Drugs)  Medication Sig   latanoprost (XALATAN) 0.005 % ophthalmic solution Place 1 drop into both eyes daily.   Propylene Glycol 0.6 % SOLN Place 1 drop into the left eye 2 (two) times daily as needed (for dry eyes).   No current facility-administered medications for this visit. (Ophthalmic Drugs)   Current Outpatient Medications (Other)  Medication Sig   Accu-Chek Softclix Lancets lancets Use to check blood sugar daily DX:E11.59   albuterol (VENTOLIN HFA) 108 (90 Base) MCG/ACT inhaler TAKE 2 PUFFS BY MOUTH EVERY 6 HOURS AS NEEDED FOR WHEEZE OR SHORTNESS OF BREATH (Patient not taking: Reported on 11/07/2021)   amiodarone (PACERONE) 200 MG tablet Take 1 tablet (200 mg total) by mouth daily. Except Sunday.   bisacodyl (DULCOLAX) 5 MG EC tablet Take 5 mg by mouth daily as needed for moderate constipation.   Blood Glucose Monitoring Suppl (ACCU-CHEK AVIVA PLUS) w/Device KIT Use to check blood sugar daily DX:E11.59   cholecalciferol (VITAMIN D) 1000 units tablet Take 2,000 Units by mouth daily.   docusate sodium (COLACE) 100 MG capsule Take 200 mg by mouth at bedtime.   ELIQUIS 5 MG TABS tablet TAKE 1 TABLET BY MOUTH TWICE A DAY   ferrous sulfate 325 (65 FE) MG  tablet Take 1 tablet (325 mg total) by mouth 2 (two) times daily with a meal.   furosemide (LASIX) 40 MG tablet Take 2 tablets (80 mg total) by mouth daily.   gabapentin (NEURONTIN) 100 MG capsule Take 1 capsule (100 mg total) by mouth 3 (three) times daily.   glucose blood (ACCU-CHEK AVIVA PLUS) test strip Use to check blood sugar daily DX:E11.59   metFORMIN (GLUCOPHAGE) 500 MG tablet TAKE 1 TABLET BY MOUTH THREE TIMES A DAY   pantoprazole (PROTONIX) 40 MG tablet TAKE 1 TABLET BY MOUTH EVERY DAY   repaglinide (PRANDIN) 2 MG tablet TAKE 1 TABLET BY MOUTH 3 TIMES DAILY BEFORE MEALS.   simvastatin (ZOCOR) 10 MG tablet TAKE 1 TABLET BY MOUTH EVERY DAY   Tiotropium Bromide-Olodaterol (STIOLTO RESPIMAT) 2.5-2.5 MCG/ACT AERS Inhale 2 puffs into the lungs daily. (Patient not taking: Reported on 11/07/2021)   vitamin B-12 (CYANOCOBALAMIN) 250 MCG tablet Take 2 tablets (500 mcg total) by mouth daily.   No current facility-administered medications for this visit. (Other)      REVIEW OF SYSTEMS: ROS   Negative for: Constitutional, Gastrointestinal, Neurological, Skin, Genitourinary, Musculoskeletal, HENT, Endocrine, Cardiovascular, Eyes, Respiratory, Psychiatric, Allergic/Imm, Heme/Lymph Last edited by Hurman Horn, MD on 12/31/2021  8:17 AM.       ALLERGIES No Known Allergies  PAST MEDICAL HISTORY Past Medical History:  Diagnosis Date   Acute diastolic  CHF (congestive heart failure) ([place]) 08/04/2020   Arthritis    Atrial fibrillation ([place])    post op; amiodarone and coumadin continued for 3 mos post op   CARCINOMA, SKIN, SQUAMOUS CELL 01/07/2010   Cataract    beginning of cataracts   COLONIC POLYPS, HX OF 04/26/2007   Coronary artery disease    s/p CABG 3/12: L-LAD, S-CFX (Dr. Roxy Manns); EF 45% at cath prior to CABG   DIABETES MELLITUS, TYPE II 04/26/2007   Hyperlipidemia    HYPERTENSION 04/26/2007   NEPHROLITHIASIS, HX OF 04/26/2007   SBO (small bowel obstruction) ([place]) 10/2015   Partial     Shortness of breath dyspnea    with exertion   Unspecified hearing loss 12/18/2008   Past Surgical History:  Procedure Laterality Date   BACK SURGERY     disectomy   CARDIOVERSION N/A 06/08/2018   Procedure: CARDIOVERSION;  Surgeon: Buford Dresser, MD;  Location[facility] ENDOSCOPY;  Service: Cardiovascular;  Laterality: N/A;   CARDIOVERSION N/A 07/25/2018   Procedure: CARDIOVERSION;  Surgeon: Fay Records, MD;  Location: [place];  Service: Cardiovascular;  Laterality: N/A;   COLONOSCOPY     CORONARY ARTERY BYPASS GRAFT  01/12/2011   LUMBAR LAMINECTOMY/DECOMPRESSION MICRODISCECTOMY N/A 05/08/2015   Procedure: LUMBAR LAMINECTOMY/DECOMPRESSION MICRODISCECTOMY 1 LEVEL Lumbar four five;  Surgeon: Melina Schools, MD;  Location: [place];  Service: Orthopedics;  Laterality: N/A;   TOTAL HIP ARTHROPLASTY Left    left on 04/15/07   TYMPANIC MEMBRANE REPAIR  1981    FAMILY HISTORY Family History  Problem Relation Age of Onset   Heart failure Mother    Heart disease Father    Cancer Sister    Cancer Brother    Heart disease Brother    Breast cancer Sister    Colon cancer Neg Hx    Esophageal cancer Neg Hx    Rectal cancer Neg Hx    Stomach cancer Neg Hx     SOCIAL HISTORY Social History   Tobacco Use   Smoking status: Former    Packs/day: 2.00    Years: 30.00    Pack years: 60.00    Types: Cigarettes    Quit date: 01/02/1981    Years since quitting: 41.0   Smokeless tobacco: Never  Vaping Use   Vaping Use: Never used  Substance Use Topics   Alcohol use: No   Drug use: No         OPHTHALMIC EXAM:  Base Eye Exam     Visual Acuity (ETDRS)       Right Left   Dist cc CF at 3' 20/20 -1    Correction: Glasses         Tonometry (Tonopen, 8:19 AM)       Right Left   Pressure 15 13         Pupils       Pupils APD   Right PERRL None   Left PERRL None         Visual Fields       Left Right    Full    Restrictions  Partial inner superior temporal,  inferior temporal, superior nasal, inferior nasal deficiencies         Extraocular Movement       Right Left    Full, Ortho Full, Ortho         Neuro/Psych     Oriented x3: Yes   Mood/Affect: Normal         Dilation  Both eyes: 1.0% Mydriacyl, 2.5% Phenylephrine @ 8:19 AM           Slit Lamp and Fundus Exam     External Exam       Right Left   External Normal Normal         Slit Lamp Exam       Right Left   Lids/Lashes Normal Normal   Conjunctiva/Sclera White and quiet White and quiet   Cornea Clear Clear   Anterior Chamber Deep and quiet Deep and quiet   Iris Round and reactive Round and reactive   Lens 3+ Nuclear sclerosis Posterior chamber intraocular lens   Anterior Vitreous Normal Normal         Fundus Exam       Right Left   Posterior Vitreous Normal Normal   Disc Normal Normal   C/D Ratio 0.5 0.5   Macula with no new hemorrhage, large RPED, may have a small tear in the RPE Intermediate age related macular degeneration, Soft drusen, no macular thickening, no hemorrhage   Vessels Normal Normal   Periphery Normal Normal            IMAGING AND PROCEDURES  Imaging and Procedures for 12/31/21  OCT, Retina - OU - Both Eyes       Right Eye Quality was good. Scan locations included subfoveal. Central Foveal Thickness: 283. Progression has improved. Findings include abnormal foveal contour, intraretinal fluid, subretinal fluid, pigment epithelial detachment.   Left Eye Quality was good. Scan locations included subfoveal. Central Foveal Thickness: 295. Progression has been stable. Findings include abnormal foveal contour, retinal drusen , epiretinal membrane.   Notes Significant hemorrhagic PED OD.  Subfoveal location.  Wet AMD OD.  Overall much smaller pigment epithelial detachment since onset of therapy June 2022..  With less thickening, we will repeat injection today, and improved overall since onset of therapy June 2022, no  residual subretinal fluid currently.  Vision limited by subretinal fibrosis  OS with minor epiretinal membrane.  Intermediate ARMD also present with drusen deposits, and old chronic solitary intraretinal cyst, not active over many months thus not an active disease process OS     Intravitreal Injection, Pharmacologic Agent - OD - Right Eye       Time Out 12/31/2021. 9:02 AM. Confirmed correct patient, procedure, site, and patient consented.   Anesthesia Topical anesthesia was used. Anesthetic medications included Lidocaine 4%.   Procedure Preparation included 5% betadine to ocular surface, 10% betadine to eyelids, Ofloxacin , Tobramycin 0.3%. A 30 gauge needle was used.   Injection: 2.5 mg bevacizumab 2.5 MG/0.1ML   Route: Intravitreal, Site: Right Eye   NDC: [phone], Lot: [id number]   Post-op Post injection exam found visual acuity of at least counting fingers. The patient tolerated the procedure well. There were no complications. The patient received written and verbal post procedure care education. Post injection medications included ocuflox.      Color Fundus Photography Optos - OU - Both Eyes       Right Eye Progression has no prior data. Disc findings include normal observations. Macula : retinal pigment epithelium abnormalities, drusen. Vessels : normal observations. Periphery : normal observations.   Left Eye Progression has no prior data. Disc findings include normal observations. Macula : drusen, epiretinal membrane. Vessels : normal observations. Periphery : normal observations.   Notes OD with a 1.75 disc area size subfoveal disciform scar accounting for acuity with associated atrophy. This region is now well pigmented circumferentially to this  lesion.  This does signifying likely that it is undergoing regression, thus we will treat again today to maintain and prevent scotoma enlargement and thereafter extend interval examination              ASSESSMENT/PLAN:  Nuclear sclerotic cataract of right eye Stable over time  Left epiretinal membrane Minor, no impact on acuity OS observe  Intermediate stage nonexudative age-related macular degeneration of both eyes No sign of CNVM OS  Exudative age-related macular degeneration of right eye with active choroidal neovascularization ([place]) Stabilized post onset of therapy and finding June 2022.  Subretinal fibrosis limits acuity.  Clinically the right eye has evidence of pigmentary change documented today on color fundus photography signifying that this lesion is no longer growing however we will continue suppressive therapy today at 7-week interval examination in the week extended to 8 weeks     ICD-10-CM   1. Exudative age-related macular degeneration of right eye with active choroidal neovascularization (HCC)  H35.3211 OCT, Retina - OU - Both Eyes    Intravitreal Injection, Pharmacologic Agent - OD - Right Eye    bevacizumab (AVASTIN) SOSY 2.5 mg    Color Fundus Photography Optos - OU - Both Eyes    2. Nuclear sclerotic cataract of right eye  H25.11     3. Left epiretinal membrane  H35.372     4. Intermediate stage nonexudative age-related macular degeneration of both eyes  H35.3132       1.  OD continues to respond very nicely to intravitreal Avastin, currently at 7 weeks.  No enlargement of large subfoveal vascularized PED.  Disciform scar centrally accounts for acuity.  Pigmentary changes noted clinically and on color fundus (photography does suggest involutional process ongoing.  2.  Repeat intravitreal Avastin today extended interval exam nation next 8 weeks  3.  Ophthalmic Meds Ordered this visit:  Meds ordered this encounter  Medications   bevacizumab (AVASTIN) SOSY 2.5 mg       Return in about 8 weeks (around 02/25/2022) for dilate, OD, AVASTIN OCT.  There are no Patient Instructions on file for this visit.   Explained the diagnoses, plan, and follow up with  the patient and they expressed understanding.  Patient expressed understanding of the importance of proper follow up care.   Clent Demark Marca Gadsby M.D. Diseases & Surgery of the Retina and Vitreous Retina & Diabetic [place] 12/31/21     Abbreviations: M myopia (nearsighted); A astigmatism; H hyperopia (farsighted); P presbyopia; Mrx spectacle prescription;  CTL contact lenses; OD right eye; OS left eye; OU both eyes  XT exotropia; ET esotropia; PEK punctate epithelial keratitis; PEE punctate epithelial erosions; DES dry eye syndrome; MGD meibomian gland dysfunction; ATs artificial tears; PFAT's preservative free artificial tears; [place] nuclear sclerotic cataract; PSC posterior subcapsular cataract; ERM epi-retinal membrane; PVD posterior vitreous detachment; RD retinal detachment; DM diabetes mellitus; DR diabetic retinopathy; NPDR non-proliferative diabetic retinopathy; PDR proliferative diabetic retinopathy; CSME clinically significant macular edema; DME diabetic macular edema; dbh dot blot hemorrhages; CWS cotton wool spot; POAG primary open angle glaucoma; C/D cup-to-disc ratio; HVF humphrey visual field; GVF goldmann visual field; OCT optical coherence tomography; IOP intraocular pressure; BRVO Branch retinal vein occlusion; CRVO central retinal vein occlusion; CRAO central retinal artery occlusion; BRAO branch retinal artery occlusion; RT retinal tear; SB scleral buckle; PPV pars plana vitrectomy; VH Vitreous hemorrhage; PRP panretinal laser photocoagulation; IVK intravitreal kenalog; VMT vitreomacular traction; MH Macular hole;  NVD neovascularization of the disc; NVE neovascularization elsewhere; AREDS age  related eye disease study; ARMD age related macular degeneration; POAG primary open angle glaucoma; EBMD epithelial/anterior basement membrane dystrophy; ACIOL anterior chamber intraocular lens; IOL intraocular lens; PCIOL posterior chamber intraocular lens; Phaco/IOL phacoemulsification with  intraocular lens placement; Pernella Ackerley photorefractive keratectomy; LASIK laser assisted in situ keratomileusis; HTN hypertension; DM diabetes mellitus; COPD chronic obstructive pulmonary disease

## 2021-12-31 NOTE — Assessment & Plan Note (Signed)
No sign of CNVM OS 

## 2021-12-31 NOTE — Assessment & Plan Note (Signed)
Stable over time. 

## 2021-12-31 NOTE — Assessment & Plan Note (Signed)
Stabilized post onset of therapy and finding June 2022.  Subretinal fibrosis limits acuity.  Clinically the right eye has evidence of pigmentary change documented today on color fundus photography signifying that this lesion is no longer growing however we will continue suppressive therapy today at 7-week interval examination in the week extended to 8 weeks

## 2021-12-31 NOTE — Assessment & Plan Note (Signed)
Minor, no impact on acuity OS observe

## 2022-01-01 ENCOUNTER — Encounter (INDEPENDENT_AMBULATORY_CARE_PROVIDER_SITE_OTHER): Payer: Medicare Other | Admitting: Ophthalmology

## 2022-01-01 ENCOUNTER — Other Ambulatory Visit: Payer: Self-pay | Admitting: Internal Medicine

## 2022-01-02 NOTE — Telephone Encounter (Signed)
Refill request

## 2022-01-12 NOTE — Progress Notes (Signed)
Cardiology Office Note   Date:  01/14/2022   ID:  [name], DOB [birth year], MRN [id number]  PCP:  Cassandria Anger, MD  Cardiologist:   [name], MD   F/u of CAD and atrial fibrillaiton        History of Present Illness: [name] is a 87 y.o. male with a history ofCAD, (s/p CABG 01/2011 LIMA to LAD; SVG to Cx),diabetes, hypertension, nephrolithiasis, hyperlipidemia, and atrial fibrillaiton  He underwent cardioversion in past that [place] and then had  amiodarone load.   Converted   Went back into atrial fib   .. .  Echo in Sept 2019 LVEF est at 40 to 45%   By my review it appeared greater   Myovue in November 2019 was normal    The pt had near syncope in June 2020  Monitor showed no arrhythmias  Symptoms improved/resolved    In Dec 2020 he and his wife were hospitalized with COVID   H has had complaints of SOB in past   Lasix dose has been adjusted    I saw the pt in early 2022   He has bbeen seen by B Bhagat ince  Myoview in May 2022 showed inferior defect consistent with scar and mild periinfarct ischemia   LVEF 40%   Echo though showed LVEF 60 to 65% with normal wall motion   COnsistent with soft tissue attenuation and possible subendocardial scar  Since seen in clinic he denies CP   Still gets SOB with activity   Unchanged. Denies dizziness   No syncope  No palpitations   Current Meds  Medication Sig   Accu-Chek Softclix Lancets lancets Use to check blood sugar daily DX:E11.59   albuterol (VENTOLIN HFA) 108 (90 Base) MCG/ACT inhaler TAKE 2 PUFFS BY MOUTH EVERY 6 HOURS AS NEEDED FOR WHEEZE OR SHORTNESS OF BREATH   amiodarone (PACERONE) 200 MG tablet Take 1 tablet (200 mg total) by mouth daily. Except Sunday.   bisacodyl (DULCOLAX) 5 MG EC tablet Take 5 mg by mouth daily as needed for moderate constipation.   Blood Glucose Monitoring Suppl (ACCU-CHEK AVIVA PLUS) w/Device KIT Use to check blood sugar daily DX:E11.59   cholecalciferol (VITAMIN D) 1000  units tablet Take 2,000 Units by mouth daily.   docusate sodium (COLACE) 100 MG capsule Take 200 mg by mouth at bedtime.   ELIQUIS 5 MG TABS tablet TAKE 1 TABLET BY MOUTH TWICE A DAY   ferrous sulfate 325 (65 FE) MG tablet Take 1 tablet (325 mg total) by mouth 2 (two) times daily with a meal.   furosemide (LASIX) 40 MG tablet Take 2 tablets (80 mg total) by mouth daily.   gabapentin (NEURONTIN) 100 MG capsule Take 1 capsule (100 mg total) by mouth 3 (three) times daily.   glucose blood (ACCU-CHEK AVIVA PLUS) test strip Use to check blood sugar daily DX:E11.59   latanoprost (XALATAN) 0.005 % ophthalmic solution Place 1 drop into both eyes daily.   metFORMIN (GLUCOPHAGE) 500 MG tablet TAKE 1 TABLET BY MOUTH THREE TIMES A DAY   pantoprazole (PROTONIX) 40 MG tablet TAKE 1 TABLET BY MOUTH EVERY DAY   Propylene Glycol 0.6 % SOLN Place 1 drop into the left eye 2 (two) times daily as needed (for dry eyes).   repaglinide (PRANDIN) 2 MG tablet TAKE 1 TABLET BY MOUTH 3 TIMES DAILY BEFORE MEALS.   simvastatin (ZOCOR) 10 MG tablet TAKE 1 TABLET BY MOUTH EVERY DAY   Tiotropium Bromide-Olodaterol (STIOLTO  RESPIMAT) 2.5-2.5 MCG/ACT AERS Inhale 2 puffs into the lungs daily.   vitamin B-12 (CYANOCOBALAMIN) 250 MCG tablet Take 2 tablets (500 mcg total) by mouth daily.     Allergies:   Patient has no known allergies.   Past Medical History:  Diagnosis Date   Acute diastolic CHF (congestive heart failure) ([place]) 08/04/2020   Arthritis    Atrial fibrillation ([place])    post op; amiodarone and coumadin continued for 3 mos post op   CARCINOMA, SKIN, SQUAMOUS CELL 01/07/2010   Cataract    beginning of cataracts   COLONIC POLYPS, HX OF 04/26/2007   Coronary artery disease    s/p CABG 3/12: L-LAD, S-CFX (Dr. Roxy Manns); EF 45% at cath prior to CABG   DIABETES MELLITUS, TYPE II 04/26/2007   Hyperlipidemia    HYPERTENSION 04/26/2007   NEPHROLITHIASIS, HX OF 04/26/2007   SBO (small bowel obstruction) ([place]) 10/2015    Partial    Shortness of breath dyspnea    with exertion   Unspecified hearing loss 12/18/2008    Past Surgical History:  Procedure Laterality Date   BACK SURGERY     disectomy   CARDIOVERSION N/A 06/08/2018   Procedure: CARDIOVERSION;  Surgeon: Buford Dresser, MD;  Location: [facility] ENDOSCOPY;  Service: Cardiovascular;  Laterality: N/A;   CARDIOVERSION N/A 07/25/2018   Procedure: CARDIOVERSION;  Surgeon: Fay Records, MD;  Location: [place];  Service: Cardiovascular;  Laterality: N/A;   COLONOSCOPY     CORONARY ARTERY BYPASS GRAFT  01/12/2011   LUMBAR LAMINECTOMY/DECOMPRESSION MICRODISCECTOMY N/A 05/08/2015   Procedure: LUMBAR LAMINECTOMY/DECOMPRESSION MICRODISCECTOMY 1 LEVEL Lumbar four five;  Surgeon: Melina Schools, MD;  Location: [place];  Service: Orthopedics;  Laterality: N/A;   TOTAL HIP ARTHROPLASTY Left    left on 04/15/07   TYMPANIC MEMBRANE REPAIR  1981     Social History:  The patient  reports that he quit smoking about 41 years ago. His smoking use included cigarettes. He has a 60.00 pack-year smoking history. He has never used smokeless tobacco. He reports that he does not drink alcohol and does not use drugs.   Family History:  The patient's family history includes Breast cancer in his sister; Cancer in his brother and sister; Heart disease in his brother and father; Heart failure in his mother.    ROS:  Please see the history of present illness. All other systems are reviewed and  Negative to the above problem except as noted.    PHYSICAL EXAM: VS:  BP 140/80    Pulse 68    Ht _0  (1.727 m)    Wt 227 lb 9.6 oz (103.2 kg)    SpO2 94%    BMI 34.61 kg/m   GEN:  obese 87 yo in no acute distress  HEENT: normal  Neck: JVP is normal   Cardiac:  Regular rate and rhytm; no murmurs No LE edema   Respiratory:  clear to auscultation bilaterally GI: soft, nontender, obese    Skin: warm and dry,  L hand with nonhealing wound above thumb Neuro:  Strength and sensation are  intact Psych: euthymic mood, full affect  Echo   04/07/21  E/LatE' elevated but in setting of severe MAC diastolic parameters indeterminate . Left ventricular ejection fraction, by estimation, is 60 to 65%. The left ventricle has normal function. The left ventricle has no regional wall motion abnormalities. Left ventricular diastolic parameters are indeterminate. 2. Right ventricular systolic function is normal. The right ventricular size is normal. 3. Left atrial  size was moderately dilated. 4. Right atrial size was mildly dilated. 5. The mitral valve is degenerative. Mild to moderate mitral valve regurgitation. No evidence of mitral stenosis. Severe mitral annular calcification. 6. The aortic valve is tricuspid. There is mild calcification of the aortic valve. Aortic valve regurgitation is not visualized. Mild aortic valve sclerosis is present, with no evidence of aortic valve stenosis. 7. The inferior vena cava is normal in size with greater than 50% respiratory variability, suggesting right atrial pressure of 3 mmHg.  Myoview  04/03/21  Nuclear stress EF: 40%. The left ventricular ejection fraction is moderately decreased (30-44%). There was no ST segment deviation noted during stress. There is a small defect of severe severity present in the basal inferior, mid inferior and apical inferior location. The defect is partially reversible and is consistent with inferior infarct with peri infarct ischemia. This is a high risk study. Compared to study of 2019, there is a now an inferior defect worse on stress imaging and LVF appears down with focal inferoseptal and inferior wall motion abnormalities Consider 2D echo   Echo 9/21  Left ventricular ejection fraction, by estimation, is 55 to 60%. The left ventricle has normal function. The left ventricle has no regional wall motion abnormalities. Left ventricular diastolic parameters are consistent with Grade II diastolic dysfunction  (pseudonormalization). Elevated left ventricular end-diastolic pressure. 2. Right ventricular systolic function is normal. The right ventricular size is normal. Tricuspid regurgitation signal is inadequate for assessing PA pressure. 3. Left atrial size was mildly dilated. 4. The mitral valve is normal in structure. Trivial mitral valve regurgitation. No evidence of mitral stenosis. Moderate mitral annular calcification. 5. The aortic valve was not well visualized. Aortic valve regurgitation is not visualized. Mild aortic valve sclerosis is present, with no evidence of aortic valve stenosis. 6. The inferior vena cava is normal in size with greater than 50% respiratory variability, suggesting right atrial pressure of 3 mmHg.  EKG:  EKG Is ordered today  SR 68 bpm   Occasional PVC   First degree AV block   PR 258    Lipid Panel    Component Value Date/Time   CHOL 126 08/09/2020 1356   TRIG 149 08/09/2020 1356   TRIG 78 11/05/2006 1123   HDL 30 (L) 08/09/2020 1356   CHOLHDL 4.2 08/09/2020 1356   CHOLHDL 2.5 01/17/2016 1209   VLDL 20 01/17/2016 1209   LDLCALC 70 08/09/2020 1356      Wt Readings from Last 3 Encounters:  01/14/22 227 lb 9.6 oz (103.2 kg)  12/04/21 226 lb (102.5 kg)  12/02/21 227 lb (103 kg)      ASSESSMENT AND PLAN:  1  Dyspnea  Still gets winded   PFTs in past showed some obstruction  Not severe   Some restriction   Breathing is unchaged    Some may be due to size  Watch diet     Follow sats at home    2 Atrial fibrillation   Remains in SR     check labs  May back down on Amio     3  CAD   Pt s/p CABG in 2012 Myovue scan in 2022 showed no ischemia       4  HTN   BP is controlled   5  HL Continue simvastatin  LDL in Feb Sept 2021  Check lipids     6 Hx of d izziness  Pt denies symptmos    7  Morbid obesity  I  have reviewed diet  Cut back on carbs     Follow-up in 6 to 8 months     Current medicines are reviewed at length with the patient today.  The  patient does not have concerns regarding medicines.  Signed, [name], MD  01/14/2022 4:24 PM    [facility] [address], [place], [place]  [address] Phone: [phone]; Fax: [phone]

## 2022-01-14 ENCOUNTER — Other Ambulatory Visit: Payer: Self-pay

## 2022-01-14 ENCOUNTER — Ambulatory Visit: Payer: Medicare Other | Admitting: Internal Medicine

## 2022-01-14 ENCOUNTER — Encounter: Payer: Self-pay | Admitting: Internal Medicine

## 2022-01-14 VITALS — BP 140/80 | HR 68 | Ht 68.0 in | Wt 227.6 lb

## 2022-01-14 DIAGNOSIS — Z79899 Other long term (current) drug therapy: Secondary | ICD-10-CM

## 2022-01-14 DIAGNOSIS — I5032 Chronic diastolic (congestive) heart failure: Secondary | ICD-10-CM

## 2022-01-14 DIAGNOSIS — I2510 Atherosclerotic heart disease of native coronary artery without angina pectoris: Secondary | ICD-10-CM

## 2022-01-14 DIAGNOSIS — R0602 Shortness of breath: Secondary | ICD-10-CM | POA: Diagnosis not present

## 2022-01-14 DIAGNOSIS — D649 Anemia, unspecified: Secondary | ICD-10-CM

## 2022-01-14 DIAGNOSIS — E782 Mixed hyperlipidemia: Secondary | ICD-10-CM | POA: Diagnosis not present

## 2022-01-14 DIAGNOSIS — I480 Paroxysmal atrial fibrillation: Secondary | ICD-10-CM | POA: Diagnosis not present

## 2022-01-14 NOTE — Patient Instructions (Addendum)
Medication Instructions:  ?Your physician recommends that you continue on your current medications as directed. Please refer to the Current Medication list given to you today. ? ?*If you need a refill on your cardiac medications before your next appointment, please call your pharmacy* ? ? ?Lab Work: ?Lipid, cbc, cmet, esr, pro bnp, tsh ?If you have labs (blood work) drawn today and your tests are completely normal, you will receive your results only by: ?MyChart Message (if you have MyChart) OR ?A paper copy in the mail ?If you have any lab test that is abnormal or we need to change your treatment, we will call you to review the results. ? ? ?Testing/Procedures: Chest Xray  ? ? ? ?Follow-Up: ?At [facility], you and your health needs are our priority.  As part of our continuing mission to provide you with exceptional heart care, we have created designated Provider Care Teams.  These Care Teams include your primary Cardiologist (physician) and Advanced Practice Providers (APPs -  Physician Assistants and Nurse Practitioners) who all work together to provide you with the care you need, when you need it. ? ?We recommend signing up for the patient portal called "MyChart".  Sign up information is provided on this After Visit Summary.  MyChart is used to connect with patients for Virtual Visits (Telemedicine).  Patients are able to view lab/test results, encounter notes, upcoming appointments, etc.  Non-urgent messages can be sent to your provider as well.   ?To learn more about what you can do with MyChart, go to [email or web address].   ? ?Your next appointment:   ?6 month(s) ? ?The format for your next appointment:   ?In Person ? ?Provider:   ?[name], MD   ? ? ?Other Instructions ?  ?

## 2022-01-15 ENCOUNTER — Ambulatory Visit
Admission: RE | Admit: 2022-01-15 | Discharge: 2022-01-15 | Disposition: A | Discharge status: Home / Self Care | Payer: Medicare Other | Source: Ambulatory Visit | Attending: Internal Medicine | Admitting: Internal Medicine

## 2022-01-15 DIAGNOSIS — Z79899 Other long term (current) drug therapy: Secondary | ICD-10-CM

## 2022-01-15 DIAGNOSIS — I480 Paroxysmal atrial fibrillation: Secondary | ICD-10-CM

## 2022-01-15 DIAGNOSIS — R0602 Shortness of breath: Secondary | ICD-10-CM

## 2022-01-15 DIAGNOSIS — D649 Anemia, unspecified: Secondary | ICD-10-CM

## 2022-01-15 DIAGNOSIS — I5032 Chronic diastolic (congestive) heart failure: Secondary | ICD-10-CM

## 2022-01-15 DIAGNOSIS — R0600 Dyspnea, unspecified: Secondary | ICD-10-CM | POA: Diagnosis not present

## 2022-01-15 DIAGNOSIS — I2510 Atherosclerotic heart disease of native coronary artery without angina pectoris: Secondary | ICD-10-CM

## 2022-01-15 DIAGNOSIS — E782 Mixed hyperlipidemia: Secondary | ICD-10-CM

## 2022-01-15 LAB — LIPID PANEL
Chol/HDL Ratio: 3 ratio (ref 0.0–5.0)
Cholesterol, Total: 114 mg/dL (ref 100–199)
HDL: 38 mg/dL — ABNORMAL LOW (ref >39)
LDL Chol Calc (NIH): 54 mg/dL (ref 0–99)
Triglycerides: 120 mg/dL (ref 0–149)
VLDL Cholesterol Cal: 22 mg/dL (ref 5–40)

## 2022-01-15 LAB — COMPREHENSIVE METABOLIC PANEL
ALT: 20 IU/L (ref 0–44)
AST: 22 IU/L (ref 0–40)
Albumin/Globulin Ratio: 1.7 (ref 1.2–2.2)
Albumin: 4.1 g/dL (ref 3.6–4.6)
Alkaline Phosphatase: 58 IU/L (ref 44–121)
BUN/Creatinine Ratio: 14 (ref 10–24)
BUN: 16 mg/dL (ref 8–27)
Bilirubin Total: 0.4 mg/dL (ref 0.0–1.2)
CO2: 22 mmol/L (ref 20–29)
Calcium: 9.1 mg/dL (ref 8.6–10.2)
Chloride: 106 mmol/L (ref 96–106)
Creatinine, Ser: 1.11 mg/dL (ref 0.76–1.27)
Globulin, Total: 2.4 g/dL (ref 1.5–4.5)
Glucose: 97 mg/dL (ref 70–99)
Potassium: 3.9 mmol/L (ref 3.5–5.2)
Sodium: 141 mmol/L (ref 134–144)
Total Protein: 6.5 g/dL (ref 6.0–8.5)
eGFR: 65 mL/min/{1.73_m2} (ref >59)

## 2022-01-15 LAB — CBC
Hematocrit: 38.5 % (ref 37.5–51.0)
Hemoglobin: 13.5 g/dL (ref 13.0–17.7)
MCH: 32.6 pg (ref 26.6–33.0)
MCHC: 35.1 g/dL (ref 31.5–35.7)
MCV: 93 fL (ref 79–97)
Platelets: 227 10*3/uL (ref 150–450)
RBC: 4.14 x10E6/uL (ref 4.14–5.80)
RDW: 13.4 % (ref 11.6–15.4)
WBC: 6.1 10*3/uL (ref 3.4–10.8)

## 2022-01-15 LAB — SEDIMENTATION RATE: Sed Rate: 16 mm/hr (ref 0–30)

## 2022-01-15 LAB — PRO B NATRIURETIC PEPTIDE: NT-Pro BNP: 908 pg/mL — ABNORMAL HIGH (ref 0–486)

## 2022-01-15 LAB — TSH: TSH: 12.3 u[IU]/mL — ABNORMAL HIGH (ref 0.450–4.500)

## 2022-01-19 ENCOUNTER — Other Ambulatory Visit: Payer: Self-pay

## 2022-01-19 ENCOUNTER — Telehealth: Payer: Self-pay

## 2022-01-19 DIAGNOSIS — Z79899 Other long term (current) drug therapy: Secondary | ICD-10-CM

## 2022-01-19 DIAGNOSIS — I480 Paroxysmal atrial fibrillation: Secondary | ICD-10-CM

## 2022-01-19 DIAGNOSIS — R0602 Shortness of breath: Secondary | ICD-10-CM

## 2022-01-19 DIAGNOSIS — E039 Hypothyroidism, unspecified: Secondary | ICD-10-CM

## 2022-01-19 DIAGNOSIS — E8770 Fluid overload, unspecified: Secondary | ICD-10-CM

## 2022-01-19 MED ORDER — FUROSEMIDE 40 MG PO TABS: 80.0000 mg | ORAL_TABLET | Freq: Every day | ORAL | 3 refills | Status: DC

## 2022-01-19 MED ORDER — METOLAZONE 2.5 MG PO TABS: ORAL_TABLET | ORAL | 3 refills | Status: DC

## 2022-01-19 NOTE — Telephone Encounter (Signed)
-----   Message from Fay Records, MD sent at 01/16/2022  2:01 PM EST ----- ?LIpids are excellent  ?CBC is OK ?Liver function and kidney function aer OK ? ?Fluid is up a little    Watch sodium   Can take 2.5 Lasix 1x per week     ?Thyroid function needs to be looked at a little closer   Would check free t3, free T4    ?

## 2022-01-19 NOTE — Telephone Encounter (Signed)
-----   Message from Fay Records, MD sent at 01/19/2022  2:00 PM EST ----- ?XR shows  some vascular congestion.  The pt did not take the biggest breath which can be confounding  ?Can try Zaroxylyn 2.5 mg 2x per week     Take 30 min prior to lasix     Limit salt ?Get BMET and BNP in 3 weeks  ?

## 2022-01-19 NOTE — Telephone Encounter (Signed)
Pt verbalized understanding of his lab results and will come in for BMET and BNP 02/12/22. He asked to have his Thyroid labs then as well.. I advised him we will do what is easiest for him but it is optimal to check his Thyroid as soon as possible for any plans to treat... he says he will plan the 3t0h but will call if he can come by sooner.  ?

## 2022-01-19 NOTE — Telephone Encounter (Signed)
Pt verbalized understanding of his lab results and will have new labs per Dr. Harrington Challenger drawn Thursday 01/22/22.  ?

## 2022-01-22 ENCOUNTER — Other Ambulatory Visit: Payer: Medicare Other

## 2022-01-30 ENCOUNTER — Other Ambulatory Visit: Payer: Self-pay | Admitting: Internal Medicine

## 2022-02-12 ENCOUNTER — Other Ambulatory Visit: Payer: Medicare Other

## 2022-02-12 DIAGNOSIS — E8770 Fluid overload, unspecified: Secondary | ICD-10-CM

## 2022-02-12 DIAGNOSIS — R0602 Shortness of breath: Secondary | ICD-10-CM

## 2022-02-12 DIAGNOSIS — Z79899 Other long term (current) drug therapy: Secondary | ICD-10-CM | POA: Diagnosis not present

## 2022-02-12 DIAGNOSIS — I480 Paroxysmal atrial fibrillation: Secondary | ICD-10-CM | POA: Diagnosis not present

## 2022-02-12 DIAGNOSIS — E039 Hypothyroidism, unspecified: Secondary | ICD-10-CM

## 2022-02-13 ENCOUNTER — Telehealth: Payer: Self-pay | Admitting: *Deleted

## 2022-02-13 DIAGNOSIS — E876 Hypokalemia: Secondary | ICD-10-CM

## 2022-02-13 DIAGNOSIS — Z79899 Other long term (current) drug therapy: Secondary | ICD-10-CM

## 2022-02-13 LAB — PRO B NATRIURETIC PEPTIDE: NT-Pro BNP: 470 pg/mL (ref 0–486)

## 2022-02-13 LAB — BASIC METABOLIC PANEL
BUN/Creatinine Ratio: 17 (ref 10–24)
BUN: 31 mg/dL — ABNORMAL HIGH (ref 8–27)
CO2: 32 mmol/L — ABNORMAL HIGH (ref 20–29)
Calcium: 9.4 mg/dL (ref 8.6–10.2)
Chloride: 93 mmol/L — ABNORMAL LOW (ref 96–106)
Creatinine, Ser: 1.78 mg/dL — ABNORMAL HIGH (ref 0.76–1.27)
Glucose: 150 mg/dL — ABNORMAL HIGH (ref 70–99)
Potassium: 3.1 mmol/L — ABNORMAL LOW (ref 3.5–5.2)
Sodium: 139 mmol/L (ref 134–144)
eGFR: 37 mL/min/{1.73_m2} — ABNORMAL LOW (ref >59)

## 2022-02-13 LAB — T4, FREE: Free T4: 1.09 ng/dL (ref 0.82–1.77)

## 2022-02-13 LAB — T3, FREE: T3, Free: 3.2 pg/mL (ref 2.0–4.4)

## 2022-02-13 MED ORDER — POTASSIUM CHLORIDE CRYS ER 20 MEQ PO TBCR: 40.0000 meq | EXTENDED_RELEASE_TABLET | Freq: Two times a day (BID) | ORAL | 0 refills | Status: DC

## 2022-02-13 MED ORDER — METOLAZONE 2.5 MG PO TABS: ORAL_TABLET | ORAL | 3 refills | Status: DC

## 2022-02-13 NOTE — Telephone Encounter (Signed)
The patient has been notified of the result and verbalized understanding.  All questions (if any) were answered. ?Darrell Jewel, RN 02/13/2022 11:53 AM  ?  ? ?DOD Caryl Comes ordered to reduce Metolazone to once every two weeks. Start potassium 40 mg two times a day for 2 days then 40 daily for 2 days then stop. Recheck lab work in 10 days.  ? ?Ordered medication changes and labs. Scheduled lab April 10. Patient verbalized understanding and agreement with read back.  ?

## 2022-02-23 ENCOUNTER — Other Ambulatory Visit: Payer: Medicare Other | Admitting: *Deleted

## 2022-02-23 DIAGNOSIS — Z79899 Other long term (current) drug therapy: Secondary | ICD-10-CM

## 2022-02-23 DIAGNOSIS — E876 Hypokalemia: Secondary | ICD-10-CM | POA: Diagnosis not present

## 2022-02-23 LAB — BASIC METABOLIC PANEL
BUN/Creatinine Ratio: 16 (ref 10–24)
BUN: 22 mg/dL (ref 8–27)
CO2: 24 mmol/L (ref 20–29)
Calcium: 8.8 mg/dL (ref 8.6–10.2)
Chloride: 105 mmol/L (ref 96–106)
Creatinine, Ser: 1.36 mg/dL — ABNORMAL HIGH (ref 0.76–1.27)
Glucose: 132 mg/dL — ABNORMAL HIGH (ref 70–99)
Potassium: 3.9 mmol/L (ref 3.5–5.2)
Sodium: 141 mmol/L (ref 134–144)
eGFR: 51 mL/min/{1.73_m2} — ABNORMAL LOW (ref >59)

## 2022-02-25 ENCOUNTER — Ambulatory Visit (INDEPENDENT_AMBULATORY_CARE_PROVIDER_SITE_OTHER): Payer: Medicare Other | Admitting: Ophthalmology

## 2022-02-25 ENCOUNTER — Encounter (INDEPENDENT_AMBULATORY_CARE_PROVIDER_SITE_OTHER): Payer: Self-pay | Admitting: Ophthalmology

## 2022-02-25 DIAGNOSIS — H353211 Exudative age-related macular degeneration, right eye, with active choroidal neovascularization: Secondary | ICD-10-CM | POA: Diagnosis not present

## 2022-02-25 DIAGNOSIS — H35372 Puckering of macula, left eye: Secondary | ICD-10-CM

## 2022-02-25 DIAGNOSIS — H353132 Nonexudative age-related macular degeneration, bilateral, intermediate dry stage: Secondary | ICD-10-CM

## 2022-02-25 DIAGNOSIS — H2511 Age-related nuclear cataract, right eye: Secondary | ICD-10-CM

## 2022-02-25 MED ORDER — BEVACIZUMAB 2.5 MG/0.1ML IZ SOSY
2.5000 mg | PREFILLED_SYRINGE | INTRAVITREAL | Status: AC | PRN
  Administered 2022-02-25: 2.5 mg via INTRAVITREAL

## 2022-02-25 NOTE — Assessment & Plan Note (Signed)
Large subfoveal PED adjacent, outer retinal scarring and loss of the photoreceptor layer appears present even though there is no intraretinal subretinal fluid.  Nonetheless stable acuity stabilized and improving macular anatomy.  Currently at interval follow-up of 8 weeks.  We will repeat injection today and evaluation again in 8 weeks ?

## 2022-02-25 NOTE — Progress Notes (Signed)
? ? ?02/25/2022 ? ?  ? ?CHIEF COMPLAINT ?Patient presents for  ?Chief Complaint  ?Patient presents with  ? Macular Degeneration  ? ? ?Follow-up for wet AMD OD with large subfoveal PED limiting acuity ? ?HISTORY OF PRESENT ILLNESS: ?[name] is a 87 y.o. male who presents to the clinic today for:  ? ?HPI   ?8 weeks dilate OD, Avastin OD, OCT. ?Patient states vision is stable and unchanged since last visit. Denies any new floaters or FOL. ?Pt uses Latanoprost QHS OD. ?Last edited by Laurin Coder on 02/25/2022  8:19 AM.  ?  ? ? ?Referring physician: ?Lisabeth Pick, MD ?[address] ?[address],  [place] [address] ? ?HISTORICAL INFORMATION:  ? ?Selected notes from the [facility] ?  ? ?Lab Results  ?Component Value Date  ? HGBA1C 6.9 (H) 09/01/2021  ?  ? ?CURRENT MEDICATIONS: ?Current Outpatient Medications (Ophthalmic Drugs)  ?Medication Sig  ? latanoprost (XALATAN) 0.005 % ophthalmic solution Place 1 drop into both eyes daily.  ? Propylene Glycol 0.6 % SOLN Place 1 drop into the left eye 2 (two) times daily as needed (for dry eyes).  ? ?No current facility-administered medications for this visit. (Ophthalmic Drugs)  ? ?Current Outpatient Medications (Other)  ?Medication Sig  ? Accu-Chek Softclix Lancets lancets Use to check blood sugar daily DX:E11.59  ? albuterol (VENTOLIN HFA) 108 (90 Base) MCG/ACT inhaler TAKE 2 PUFFS BY MOUTH EVERY 6 HOURS AS NEEDED FOR WHEEZE OR SHORTNESS OF BREATH  ? amiodarone (PACERONE) 200 MG tablet Take 1 tablet (200 mg total) by mouth daily. Except Sunday.  ? bisacodyl (DULCOLAX) 5 MG EC tablet Take 5 mg by mouth daily as needed for moderate constipation.  ? Blood Glucose Monitoring Suppl (ACCU-CHEK AVIVA PLUS) w/Device KIT Use to check blood sugar daily DX:E11.59  ? cholecalciferol (VITAMIN D) 1000 units tablet Take 2,000 Units by mouth daily.  ? docusate sodium (COLACE) 100 MG capsule Take 200 mg by mouth at bedtime.  ? ELIQUIS 5 MG TABS tablet TAKE 1 TABLET BY MOUTH  TWICE A DAY  ? ferrous sulfate 325 (65 FE) MG tablet Take 1 tablet (325 mg total) by mouth 2 (two) times daily with a meal.  ? furosemide (LASIX) 40 MG tablet Take 2 tablets (80 mg total) by mouth daily. Can take an extra 1/2 tablet by mouth once a week.  ? gabapentin (NEURONTIN) 100 MG capsule Take 1 capsule (100 mg total) by mouth 3 (three) times daily.  ? glucose blood (ACCU-CHEK AVIVA PLUS) test strip Use to check blood sugar daily DX:E11.59  ? metFORMIN (GLUCOPHAGE) 500 MG tablet TAKE 1 TABLET BY MOUTH THREE TIMES A DAY  ? metolazone (ZAROXOLYN) 2.5 MG tablet Take one tablet by mouth 30 minutes prior to taking your lasix, once every two weeks.  ? pantoprazole (PROTONIX) 40 MG tablet TAKE 1 TABLET BY MOUTH EVERY DAY  ? potassium chloride SA (KLOR-CON M) 20 MEQ tablet Take 2 tablets (40 mEq total) by mouth 2 (two) times daily. 40 mg (2 tablets) two times a day for 2 days, then 40 mg (2 tablets) once a day for 2 days, then stop.  ? repaglinide (PRANDIN) 2 MG tablet TAKE 1 TABLET BY MOUTH 3 TIMES DAILY BEFORE MEALS.  ? simvastatin (ZOCOR) 10 MG tablet TAKE 1 TABLET BY MOUTH EVERY DAY  ? Tiotropium Bromide-Olodaterol (STIOLTO RESPIMAT) 2.5-2.5 MCG/ACT AERS Inhale 2 puffs into the lungs daily.  ? vitamin B-12 (CYANOCOBALAMIN) 250 MCG tablet Take 2 tablets (  500 mcg total) by mouth daily.  ? ?No current facility-administered medications for this visit. (Other)  ? ? ? ? ?REVIEW OF SYSTEMS: ?ROS   ?Negative for: Constitutional, Gastrointestinal, Neurological, Skin, Genitourinary, Musculoskeletal, HENT, Endocrine, Cardiovascular, Eyes, Respiratory, Psychiatric, Allergic/Imm, Heme/Lymph ?Last edited by Hurman Horn, MD on 02/25/2022  9:09 AM.  ?  ? ? ? ?ALLERGIES ?No Known Allergies ? ?PAST MEDICAL HISTORY ?Past Medical History:  ?Diagnosis Date  ? Acute diastolic CHF (congestive heart failure) ([place]) 08/04/2020  ? Arthritis   ? Atrial fibrillation ([place])   ? post op; amiodarone and coumadin continued for 3 mos post op  ?  CARCINOMA, SKIN, SQUAMOUS CELL 01/07/2010  ? Cataract   ? beginning of cataracts  ? COLONIC POLYPS, HX OF 04/26/2007  ? Coronary artery disease   ? s/p CABG 3/12: L-LAD, S-CFX (Dr. Roxy Manns); EF 45% at cath prior to CABG  ? DIABETES MELLITUS, TYPE II 04/26/2007  ? Hyperlipidemia   ? HYPERTENSION 04/26/2007  ? NEPHROLITHIASIS, HX OF 04/26/2007  ? SBO (small bowel obstruction) ([place]) 10/2015  ? Partial   ? Shortness of breath dyspnea   ? with exertion  ? Unspecified hearing loss 12/18/2008  ? ?Past Surgical History:  ?Procedure Laterality Date  ? BACK SURGERY    ? disectomy  ? CARDIOVERSION N/A 06/08/2018  ? Procedure: CARDIOVERSION;  Surgeon: Buford Dresser, MD;  Location: [facility] ENDOSCOPY;  Service: Cardiovascular;  Laterality: N/A;  ? CARDIOVERSION N/A 07/25/2018  ? Procedure: CARDIOVERSION;  Surgeon: Fay Records, MD;  Location: [place];  Service: Cardiovascular;  Laterality: N/A;  ? COLONOSCOPY    ? CORONARY ARTERY BYPASS GRAFT  01/12/2011  ? LUMBAR LAMINECTOMY/DECOMPRESSION MICRODISCECTOMY N/A 05/08/2015  ? Procedure: LUMBAR LAMINECTOMY/DECOMPRESSION MICRODISCECTOMY 1 LEVEL Lumbar four five;  Surgeon: Melina Schools, MD;  Location: [place];  Service: Orthopedics;  Laterality: N/A;  ? TOTAL HIP ARTHROPLASTY Left   ? left on 04/15/07  ? [place]  ? ? ?FAMILY HISTORY ?Family History  ?Problem Relation Age of Onset  ? Heart failure Mother   ? Heart disease Father   ? Cancer Sister   ? Cancer Brother   ? Heart disease Brother   ? Breast cancer Sister   ? Colon cancer Neg Hx   ? Esophageal cancer Neg Hx   ? Rectal cancer Neg Hx   ? Stomach cancer Neg Hx   ? ? ?SOCIAL HISTORY ?Social History  ? ?Tobacco Use  ? Smoking status: Former  ?  Packs/day: 2.00  ?  Years: 30.00  ?  Pack years: 60.00  ?  Types: Cigarettes  ?  Quit date: 01/02/1981  ?  Years since quitting: 41.1  ? Smokeless tobacco: Never  ?Vaping Use  ? Vaping Use: Never used  ?Substance Use Topics  ? Alcohol use: No  ? Drug use: No  ? ?  ? ?   ? ?OPHTHALMIC EXAM: ? ?Base Eye Exam   ? ? Visual Acuity (ETDRS)   ? ?   Right Left  ? Dist cc CF at 3' 20/25 -1  ? Dist ph cc NI   ? ? Correction: Glasses  ? ?  ?  ? ? Tonometry (Tonopen, 8:21 AM)   ? ?   Right Left  ? Pressure 14 17  ? ?  ?  ? ? Pupils   ? ?   Pupils Dark Light React APD  ? Right PERRL 2 2 Minimal None  ? Left PERRL 2 2 Minimal  None  ? ?  ?  ? ? Extraocular Movement   ? ?   Right Left  ?  Full Full  ? ?  ?  ? ? Neuro/Psych   ? ? Oriented x3: Yes  ? Mood/Affect: Normal  ? ?  ?  ? ? Dilation   ? ? Right eye: 1.0% Mydriacyl, 2.5% Phenylephrine @ 8:21 AM  ? ?  ?  ? ?  ? ?Slit Lamp and Fundus Exam   ? ? External Exam   ? ?   Right Left  ? External Normal Normal  ? ?  ?  ? ? Slit Lamp Exam   ? ?   Right Left  ? Lids/Lashes Normal Normal  ? Conjunctiva/Sclera White and quiet White and quiet  ? Cornea Clear Clear  ? Anterior Chamber Deep and quiet Deep and quiet  ? Iris Round and reactive Round and reactive  ? Lens 3+ Nuclear sclerosis Posterior chamber intraocular lens  ? Anterior Vitreous Normal Normal  ? ?  ?  ? ? Fundus Exam   ? ?   Right Left  ? Posterior Vitreous Normal   ? Disc Normal   ? C/D Ratio 0.5   ? Macula with no new hemorrhage, large RPED, may have a small tear in the RPE   ? Vessels Normal   ? Periphery Normal   ? ?  ?  ? ?  ? ? ?IMAGING AND PROCEDURES  ?Imaging and Procedures for 02/25/22 ? ?OCT, Retina - OU - Both Eyes   ? ?   ?Right Eye ?Quality was good. Scan locations included subfoveal. Central Foveal Thickness: 303. Progression has improved. Findings include abnormal foveal contour, intraretinal fluid, subretinal fluid, pigment epithelial detachment.  ? ?Left Eye ?Quality was good. Scan locations included subfoveal. Central Foveal Thickness: 298. Progression has been stable. Findings include abnormal foveal contour, retinal drusen , epiretinal membrane.  ? ?Notes ?Significant hemorrhagic PED OD.  Subfoveal location.  Wet AMD OD.  Overall much smaller pigment epithelial detachment  since onset of therapy June 2022..  With less thickening, we will repeat injection today, and improved overall since onset of therapy June 2022, no residual subretinal fluid currently.  Vision limited by subret

## 2022-02-25 NOTE — Assessment & Plan Note (Addendum)
Okay to proceed with cataract extraction with intraocular lens placement ? ?Follow-up with [facility] eye care,  Dr. Lucianne Lei as scheduled ?

## 2022-02-25 NOTE — Assessment & Plan Note (Signed)
Minor no impact on acuity OS ?

## 2022-02-25 NOTE — Assessment & Plan Note (Signed)
No sign of CNVM OS 

## 2022-02-26 ENCOUNTER — Other Ambulatory Visit: Payer: Self-pay | Admitting: Internal Medicine

## 2022-03-02 ENCOUNTER — Telehealth: Payer: Self-pay

## 2022-03-02 DIAGNOSIS — I480 Paroxysmal atrial fibrillation: Secondary | ICD-10-CM

## 2022-03-02 DIAGNOSIS — Z79899 Other long term (current) drug therapy: Secondary | ICD-10-CM

## 2022-03-02 DIAGNOSIS — I5032 Chronic diastolic (congestive) heart failure: Secondary | ICD-10-CM

## 2022-03-02 NOTE — Telephone Encounter (Signed)
Pt to have repeat labs 04/02/22. ?

## 2022-03-02 NOTE — Telephone Encounter (Signed)
-----   Message from Fay Records, MD sent at 02/23/2022  8:43 PM EDT ----- ?Labs are better   Cr 1.36    Potassium is normal    ?Keep on same meds     ?F/U again BMET and BNP in 1 month   ?

## 2022-03-03 ENCOUNTER — Other Ambulatory Visit (HOSPITAL_COMMUNITY): Payer: Self-pay | Admitting: Internal Medicine

## 2022-03-04 ENCOUNTER — Encounter: Payer: Self-pay | Admitting: Internal Medicine

## 2022-03-04 ENCOUNTER — Ambulatory Visit (INDEPENDENT_AMBULATORY_CARE_PROVIDER_SITE_OTHER): Payer: Medicare Other | Admitting: Internal Medicine

## 2022-03-04 DIAGNOSIS — I152 Hypertension secondary to endocrine disorders: Secondary | ICD-10-CM | POA: Diagnosis not present

## 2022-03-04 DIAGNOSIS — M5137 Other intervertebral disc degeneration, lumbosacral region: Secondary | ICD-10-CM | POA: Insufficient documentation

## 2022-03-04 DIAGNOSIS — R269 Unspecified abnormalities of gait and mobility: Secondary | ICD-10-CM

## 2022-03-04 DIAGNOSIS — I259 Chronic ischemic heart disease, unspecified: Secondary | ICD-10-CM | POA: Insufficient documentation

## 2022-03-04 DIAGNOSIS — M545 Low back pain, unspecified: Secondary | ICD-10-CM

## 2022-03-04 DIAGNOSIS — E66812 Obesity, class 2: Secondary | ICD-10-CM | POA: Insufficient documentation

## 2022-03-04 DIAGNOSIS — G8929 Other chronic pain: Secondary | ICD-10-CM | POA: Diagnosis not present

## 2022-03-04 DIAGNOSIS — E1169 Type 2 diabetes mellitus with other specified complication: Secondary | ICD-10-CM | POA: Diagnosis not present

## 2022-03-04 DIAGNOSIS — E785 Hyperlipidemia, unspecified: Secondary | ICD-10-CM

## 2022-03-04 DIAGNOSIS — E669 Obesity, unspecified: Secondary | ICD-10-CM | POA: Insufficient documentation

## 2022-03-04 DIAGNOSIS — E1159 Type 2 diabetes mellitus with other circulatory complications: Secondary | ICD-10-CM

## 2022-03-04 DIAGNOSIS — D126 Benign neoplasm of colon, unspecified: Secondary | ICD-10-CM | POA: Insufficient documentation

## 2022-03-04 DIAGNOSIS — N200 Calculus of kidney: Secondary | ICD-10-CM | POA: Insufficient documentation

## 2022-03-04 DIAGNOSIS — M1990 Unspecified osteoarthritis, unspecified site: Secondary | ICD-10-CM | POA: Insufficient documentation

## 2022-03-04 LAB — COMPREHENSIVE METABOLIC PANEL
ALT: 23 U/L (ref 0–53)
AST: 26 U/L (ref 0–37)
Albumin: 4.3 g/dL (ref 3.5–5.2)
Alkaline Phosphatase: 52 U/L (ref 39–117)
BUN: 27 mg/dL — ABNORMAL HIGH (ref 6–23)
CO2: 29 mEq/L (ref 19–32)
Calcium: 9.5 mg/dL (ref 8.4–10.5)
Chloride: 102 mEq/L (ref 96–112)
Creatinine, Ser: 1.45 mg/dL (ref 0.40–1.50)
GFR: 44.07 mL/min — ABNORMAL LOW (ref >60.00)
Glucose, Bld: 139 mg/dL — ABNORMAL HIGH (ref 70–99)
Potassium: 3.5 mEq/L (ref 3.5–5.1)
Sodium: 141 mEq/L (ref 135–145)
Total Bilirubin: 0.7 mg/dL (ref 0.2–1.2)
Total Protein: 7.3 g/dL (ref 6.0–8.3)

## 2022-03-04 LAB — HEMOGLOBIN A1C: Hgb A1c MFr Bld: 6.9 % — ABNORMAL HIGH (ref 4.6–6.5)

## 2022-03-04 NOTE — Assessment & Plan Note (Signed)
Cont on Metoprolol 

## 2022-03-04 NOTE — Assessment & Plan Note (Signed)
Cont on Metformin, Prandin ? ?BMET, A1c q 3 mo ?

## 2022-03-04 NOTE — Assessment & Plan Note (Addendum)
Pt lost wt ?Blue-Emu cream was recommended to use 2-3 times a day ?Start using a rollator walker ?

## 2022-03-04 NOTE — Progress Notes (Signed)
? ?Subjective:  ?Patient ID: [name], male    DOB: Jan 25, 1935  Age: 87 y.o. MRN: [id number] ? ?CC: No chief complaint on file. ? ? ?HPI ?[name] presents for HTN, DM, CHF ? ?Outpatient Medications Prior to Visit  ?Medication Sig Dispense Refill  ? Accu-Chek Softclix Lancets lancets Use to check blood sugar daily DX:E11.59 100 each 3  ? albuterol (VENTOLIN HFA) 108 (90 Base) MCG/ACT inhaler TAKE 2 PUFFS BY MOUTH EVERY 6 HOURS AS NEEDED FOR WHEEZE OR SHORTNESS OF BREATH 6.7 each 2  ? amiodarone (PACERONE) 200 MG tablet Take 1 tablet (200 mg total) by mouth daily. Except Sunday. 90 tablet 3  ? apixaban (ELIQUIS) 5 MG TABS tablet TAKE 1 TABLET BY MOUTH TWICE A DAY 60 tablet 10  ? bisacodyl (DULCOLAX) 5 MG EC tablet Take 5 mg by mouth daily as needed for moderate constipation.    ? Blood Glucose Monitoring Suppl (ACCU-CHEK AVIVA PLUS) w/Device KIT Use to check blood sugar daily DX:E11.59 1 kit 0  ? cholecalciferol (VITAMIN D) 1000 units tablet Take 2,000 Units by mouth daily.    ? docusate sodium (COLACE) 100 MG capsule Take 200 mg by mouth at bedtime.    ? ferrous sulfate 325 (65 FE) MG tablet Take 1 tablet (325 mg total) by mouth 2 (two) times daily with a meal. 60 tablet 6  ? furosemide (LASIX) 40 MG tablet Take 2 tablets (80 mg total) by mouth daily. Can take an extra 1/2 tablet by mouth once a week. 180 tablet 3  ? gabapentin (NEURONTIN) 100 MG capsule Take 1 capsule (100 mg total) by mouth 3 (three) times daily. 270 capsule 3  ? glucose blood (ACCU-CHEK AVIVA PLUS) test strip Use to check blood sugar daily DX:E11.59 100 each 3  ? latanoprost (XALATAN) 0.005 % ophthalmic solution Place 1 drop into both eyes daily.    ? metFORMIN (GLUCOPHAGE) 500 MG tablet TAKE 1 TABLET BY MOUTH THREE TIMES A DAY 270 tablet 3  ? metolazone (ZAROXOLYN) 2.5 MG tablet Take one tablet by mouth 30 minutes prior to taking your lasix, once every two weeks. 30 tablet 3  ? pantoprazole (PROTONIX) 40 MG tablet TAKE 1  TABLET BY MOUTH EVERY DAY 90 tablet 3  ? potassium chloride SA (KLOR-CON M) 20 MEQ tablet Take 2 tablets (40 mEq total) by mouth 2 (two) times daily. 40 mg (2 tablets) two times a day for 2 days, then 40 mg (2 tablets) once a day for 2 days, then stop. 12 tablet 0  ? Propylene Glycol 0.6 % SOLN Place 1 drop into the left eye 2 (two) times daily as needed (for dry eyes).    ? repaglinide (PRANDIN) 2 MG tablet TAKE 1 TABLET BY MOUTH 3 TIMES DAILY BEFORE MEALS. 270 tablet 3  ? simvastatin (ZOCOR) 10 MG tablet TAKE 1 TABLET BY MOUTH EVERY DAY 90 tablet 2  ? Tiotropium Bromide-Olodaterol (STIOLTO RESPIMAT) 2.5-2.5 MCG/ACT AERS Inhale 2 puffs into the lungs daily. 4 g 0  ? vitamin B-12 (CYANOCOBALAMIN) 250 MCG tablet Take 2 tablets (500 mcg total) by mouth daily.    ? ?No facility-administered medications prior to visit.  ? ? ?ROS: ?Review of Systems  ?Constitutional:  Positive for fatigue and unexpected weight change. Negative for appetite change.  ?HENT:  Negative for congestion, nosebleeds, sneezing, sore throat and trouble swallowing.   ?Eyes:  Negative for itching and visual disturbance.  ?Respiratory:  Negative for cough.   ?Cardiovascular:  Negative for  chest pain, palpitations and leg swelling.  ?Gastrointestinal:  Negative for abdominal distention, blood in stool, diarrhea and nausea.  ?Genitourinary:  Negative for frequency and hematuria.  ?Musculoskeletal:  Positive for back pain and gait problem. Negative for joint swelling and neck pain.  ?Skin:  Positive for wound. Negative for rash.  ?Neurological:  Negative for dizziness, tremors, speech difficulty and weakness.  ?Psychiatric/Behavioral:  Negative for agitation, dysphoric mood and sleep disturbance. The patient is not nervous/anxious.   ? ?Objective:  ?BP 120/70 (BP Location: Left Arm, Patient Position: Sitting, Cuff Size: Large)   Pulse 87   Temp 98.3 ?F (36.8 ?C) (Oral)   Ht _0  (1.727 m)   Wt 216 lb (98 kg)   SpO2 92%   BMI 32.84 kg/m?  ? ?BP  Readings from Last 3 Encounters:  ?03/04/22 120/70  ?01/14/22 140/80  ?12/04/21 130/62  ? ? ?Wt Readings from Last 3 Encounters:  ?03/04/22 216 lb (98 kg)  ?01/14/22 227 lb 9.6 oz (103.2 kg)  ?12/04/21 226 lb (102.5 kg)  ? ? ?Physical Exam ?Constitutional:   ?   General: He is not in acute distress. ?   Appearance: He is well-developed. He is obese.  ?   Comments: NAD  ?Eyes:  ?   Conjunctiva/sclera: Conjunctivae normal.  ?   Pupils: Pupils are equal, round, and reactive to light.  ?Neck:  ?   Thyroid: No thyromegaly.  ?   Vascular: No JVD.  ?Cardiovascular:  ?   Rate and Rhythm: Normal rate and regular rhythm.  ?   Heart sounds: Normal heart sounds. No murmur heard. ?  No friction rub. No gallop.  ?Pulmonary:  ?   Effort: Pulmonary effort is normal. No respiratory distress.  ?   Breath sounds: Normal breath sounds. No wheezing or rales.  ?Chest:  ?   Chest wall: No tenderness.  ?Abdominal:  ?   General: Bowel sounds are normal. There is no distension.  ?   Palpations: Abdomen is soft. There is no mass.  ?   Tenderness: There is no abdominal tenderness. There is no guarding or rebound.  ?Musculoskeletal:     ?   General: Tenderness present. Normal range of motion.  ?   Cervical back: Normal range of motion.  ?Lymphadenopathy:  ?   Cervical: No cervical adenopathy.  ?Skin: ?   General: Skin is warm and dry.  ?   Findings: No rash.  ?Neurological:  ?   Mental Status: He is alert and oriented to person, place, and time.  ?   Cranial Nerves: No cranial nerve deficit.  ?   Motor: No abnormal muscle tone.  ?   Coordination: Coordination normal.  ?   Gait: Gait normal.  ?   Deep Tendon Reflexes: Reflexes are normal and symmetric.  ?Psychiatric:     ?   Behavior: Behavior normal.     ?   Thought Content: Thought content normal.     ?   Judgment: Judgment normal.  ?Ataxic gait, antalgic ? ?Lab Results  ?Component Value Date  ? WBC 6.1 01/14/2022  ? HGB 13.5 01/14/2022  ? HCT 38.5 01/14/2022  ? PLT 227 01/14/2022  ? GLUCOSE  132 (H) 02/23/2022  ? CHOL 114 01/14/2022  ? TRIG 120 01/14/2022  ? HDL 38 (L) 01/14/2022  ? [employer] 54 01/14/2022  ? ALT 20 01/14/2022  ? AST 22 01/14/2022  ? NA 141 02/23/2022  ? K 3.9 02/23/2022  ? CL 105 02/23/2022  ?  CREATININE 1.36 (H) 02/23/2022  ? BUN 22 02/23/2022  ? CO2 24 02/23/2022  ? TSH 12.300 (H) 01/14/2022  ? PSA 1.18 07/02/2017  ? INR 2.3 03/03/2012  ? HGBA1C 6.9 (H) 09/01/2021  ? MICROALBUR 0.7 04/04/2018  ? ? ?DG Chest 2 View ? ?Result Date: 01/16/2022 ?CLINICAL DATA:  Dyspnea. Shortness of breath. Paroxysmal atrial fibrillation. Coronary artery disease involving native coronary artery of native heart without angina. Shortness of breath. Anemia. Chronic diastolic heart failure. Medication management. Mixed hyperlipidemia. EXAM: CHEST - 2 VIEW COMPARISON:  08/04/2020, chest CT 06/06/2020 FINDINGS: Patient is post median sternotomy. Stable cardiomegaly. Chronic blunting of the right costophrenic angle. No large pleural effusion. Vascular congestion. No convincing pulmonary edema. No confluent consolidation. Biapical pleuroparenchymal scarring. No pneumothorax. No acute osseous findings. IMPRESSION: Cardiomegaly with vascular congestion. No convincing pulmonary edema. Electronically Signed   By: Keith Rake M.D.   On: 01/16/2022 21:23  ? ? ?Assessment & Plan:  ? ?Problem List Items Addressed This Visit   ? ? Back pain  ?  Pt lost wt ?Blue-Emu cream was recommended to use 2-3 times a day ?Start using a rollator walker ? ?  ?  ? Gait disorder  ?  Start using a rollator walker ?  ?  ? Diabetes mellitus, type 2 (HCC) (Chronic)  ?  Cont on Metformin, Prandin ? ?BMET, A1c q 3 mo ?  ?  ? Relevant Orders  ? Comprehensive metabolic panel  ? Hemoglobin A1c  ? Hypertension associated with diabetes ([place]) (Chronic)  ?  Cont on Metoprolol ?  ?  ? Hyperlipidemia associated with type 2 diabetes mellitus (HCC) (Chronic)  ?  Cont on Simvastatin ?  ?  ?  ? ? ?No orders of the defined types were placed in this  encounter. ?  ? ? ?Follow-up: Return in about 3 months (around 06/03/2022) for a follow-up visit. ? ?Walker Kehr, MD ?

## 2022-03-04 NOTE — Assessment & Plan Note (Signed)
Start using a rollator walker ?

## 2022-03-04 NOTE — Telephone Encounter (Signed)
Eliquis '5mg'$  refill request received. Patient is 87 years old, weight-103.2kg, Crea-1.36 on 02/23/2022, Diagnosis-Afib, and last seen by Dr. Harrington Challenger on 01/14/2022. Dose is appropriate based on dosing criteria. Will send in refill to requested pharmacy.   ?

## 2022-03-04 NOTE — Assessment & Plan Note (Signed)
Cont on Simvastatin 

## 2022-03-12 NOTE — Progress Notes (Signed)
Cut back on amiodarone to Monday through Friday    200 mg   Hold on Saturday and sunday ?

## 2022-03-16 ENCOUNTER — Telehealth: Payer: Self-pay

## 2022-03-16 MED ORDER — AMIODARONE HCL 200 MG PO TABS: 200.0000 mg | ORAL_TABLET | Freq: Every day | ORAL | 3 refills | Status: DC

## 2022-03-16 NOTE — Telephone Encounter (Signed)
-----   Message from Fay Records, MD sent at 03/12/2022 11:31 PM EDT ----- ? ? ? ?----- Message ----- ?From: Fay Records, MD ?Sent: 01/14/2022  11:37 PM EDT ?To: Fay Records, MD ? ?AMio dosing  ?

## 2022-03-16 NOTE — Telephone Encounter (Signed)
Fay Records, MD (Physician)  ?   ?   ?Cut back on amiodarone to Monday through Friday    200 mg   Hold on Saturday and sunday  ?  ? ? ?Pt advised and will keep his planned labs 04/02/22. He will let us know in a few weeks how he is doing.  ?

## 2022-03-26 DIAGNOSIS — H353211 Exudative age-related macular degeneration, right eye, with active choroidal neovascularization: Secondary | ICD-10-CM | POA: Diagnosis not present

## 2022-03-26 DIAGNOSIS — H25811 Combined forms of age-related cataract, right eye: Secondary | ICD-10-CM | POA: Diagnosis not present

## 2022-03-26 DIAGNOSIS — H353122 Nonexudative age-related macular degeneration, left eye, intermediate dry stage: Secondary | ICD-10-CM | POA: Diagnosis not present

## 2022-03-26 DIAGNOSIS — H401121 Primary open-angle glaucoma, left eye, mild stage: Secondary | ICD-10-CM | POA: Diagnosis not present

## 2022-04-02 ENCOUNTER — Other Ambulatory Visit: Payer: Medicare Other

## 2022-04-02 DIAGNOSIS — I5032 Chronic diastolic (congestive) heart failure: Secondary | ICD-10-CM

## 2022-04-02 DIAGNOSIS — I480 Paroxysmal atrial fibrillation: Secondary | ICD-10-CM

## 2022-04-02 DIAGNOSIS — Z79899 Other long term (current) drug therapy: Secondary | ICD-10-CM

## 2022-04-03 LAB — BASIC METABOLIC PANEL
BUN/Creatinine Ratio: 21 (ref 10–24)
BUN: 26 mg/dL (ref 8–27)
CO2: 26 mmol/L (ref 20–29)
Calcium: 9.4 mg/dL (ref 8.6–10.2)
Chloride: 100 mmol/L (ref 96–106)
Creatinine, Ser: 1.21 mg/dL (ref 0.76–1.27)
Glucose: 144 mg/dL — ABNORMAL HIGH (ref 70–99)
Potassium: 3.3 mmol/L — ABNORMAL LOW (ref 3.5–5.2)
Sodium: 142 mmol/L (ref 134–144)
eGFR: 59 mL/min/{1.73_m2} — ABNORMAL LOW (ref >59)

## 2022-04-03 LAB — PRO B NATRIURETIC PEPTIDE: NT-Pro BNP: 391 pg/mL (ref 0–486)

## 2022-04-07 ENCOUNTER — Telehealth: Payer: Self-pay

## 2022-04-07 DIAGNOSIS — M25511 Pain in right shoulder: Secondary | ICD-10-CM | POA: Insufficient documentation

## 2022-04-07 DIAGNOSIS — M25512 Pain in left shoulder: Secondary | ICD-10-CM | POA: Diagnosis not present

## 2022-04-07 DIAGNOSIS — M7541 Impingement syndrome of right shoulder: Secondary | ICD-10-CM | POA: Insufficient documentation

## 2022-04-07 DIAGNOSIS — Z79899 Other long term (current) drug therapy: Secondary | ICD-10-CM

## 2022-04-07 DIAGNOSIS — E876 Hypokalemia: Secondary | ICD-10-CM

## 2022-04-07 DIAGNOSIS — I5032 Chronic diastolic (congestive) heart failure: Secondary | ICD-10-CM

## 2022-04-07 DIAGNOSIS — I480 Paroxysmal atrial fibrillation: Secondary | ICD-10-CM

## 2022-04-07 MED ORDER — POTASSIUM CHLORIDE CRYS ER 20 MEQ PO TBCR: 20.0000 meq | EXTENDED_RELEASE_TABLET | Freq: Every day | ORAL | 3 refills | Status: DC

## 2022-04-07 NOTE — Telephone Encounter (Signed)
-----   Message from Fay Records, MD sent at 04/06/2022  8:56 AM EDT ----- Fluid is better    Potassium is a little low at 3.3   What is he taking now?  Med list suggests he is not on it daily    Would take 20 mEq daily if that is the case

## 2022-04-07 NOTE — Telephone Encounter (Signed)
Pt to have repeat labs 04/21/22.

## 2022-04-21 ENCOUNTER — Other Ambulatory Visit: Payer: Medicare Other

## 2022-04-21 DIAGNOSIS — I5032 Chronic diastolic (congestive) heart failure: Secondary | ICD-10-CM

## 2022-04-21 DIAGNOSIS — E876 Hypokalemia: Secondary | ICD-10-CM | POA: Diagnosis not present

## 2022-04-21 DIAGNOSIS — Z79899 Other long term (current) drug therapy: Secondary | ICD-10-CM | POA: Diagnosis not present

## 2022-04-21 DIAGNOSIS — I480 Paroxysmal atrial fibrillation: Secondary | ICD-10-CM | POA: Diagnosis not present

## 2022-04-22 ENCOUNTER — Ambulatory Visit (INDEPENDENT_AMBULATORY_CARE_PROVIDER_SITE_OTHER): Payer: Medicare Other | Admitting: Ophthalmology

## 2022-04-22 ENCOUNTER — Encounter (INDEPENDENT_AMBULATORY_CARE_PROVIDER_SITE_OTHER): Payer: Self-pay | Admitting: Ophthalmology

## 2022-04-22 DIAGNOSIS — H2511 Age-related nuclear cataract, right eye: Secondary | ICD-10-CM | POA: Diagnosis not present

## 2022-04-22 DIAGNOSIS — H353132 Nonexudative age-related macular degeneration, bilateral, intermediate dry stage: Secondary | ICD-10-CM | POA: Diagnosis not present

## 2022-04-22 DIAGNOSIS — H35372 Puckering of macula, left eye: Secondary | ICD-10-CM

## 2022-04-22 DIAGNOSIS — H353211 Exudative age-related macular degeneration, right eye, with active choroidal neovascularization: Secondary | ICD-10-CM

## 2022-04-22 LAB — BASIC METABOLIC PANEL
BUN/Creatinine Ratio: 20 (ref 10–24)
BUN: 35 mg/dL — ABNORMAL HIGH (ref 8–27)
CO2: 24 mmol/L (ref 20–29)
Calcium: 9.7 mg/dL (ref 8.6–10.2)
Chloride: 98 mmol/L (ref 96–106)
Creatinine, Ser: 1.75 mg/dL — ABNORMAL HIGH (ref 0.76–1.27)
Glucose: 135 mg/dL — ABNORMAL HIGH (ref 70–99)
Potassium: 3.6 mmol/L (ref 3.5–5.2)
Sodium: 140 mmol/L (ref 134–144)
eGFR: 38 mL/min/{1.73_m2} — ABNORMAL LOW (ref >59)

## 2022-04-22 MED ORDER — BEVACIZUMAB 2.5 MG/0.1ML IZ SOSY
2.5000 mg | PREFILLED_SYRINGE | INTRAVITREAL | Status: AC | PRN
  Administered 2022-04-22: 2.5 mg via INTRAVITREAL

## 2022-04-22 NOTE — Progress Notes (Signed)
04/22/2022     CHIEF COMPLAINT Patient presents for  Chief Complaint  Patient presents with   Macular Degeneration      HISTORY OF PRESENT ILLNESS: [name] is a 87 y.o. male who presents to the clinic today for:   HPI   History of wet AMD OD.  Currently At 8 weeks post most recent evaluation and injection of Avastin.   Last edited by Hurman Horn, MD on 04/22/2022  9:16 AM.      Referring physician: Lisabeth Pick, MD [address] [place],  [place] [address]  HISTORICAL INFORMATION:   Selected notes from the MEDICAL RECORD NUMBER    Lab Results  Component Value Date   HGBA1C 6.9 (H) 03/04/2022     CURRENT MEDICATIONS: Current Outpatient Medications (Ophthalmic Drugs)  Medication Sig   latanoprost (XALATAN) 0.005 % ophthalmic solution Place 1 drop into both eyes daily.   Propylene Glycol 0.6 % SOLN Place 1 drop into the left eye 2 (two) times daily as needed (for dry eyes).   No current facility-administered medications for this visit. (Ophthalmic Drugs)   Current Outpatient Medications (Other)  Medication Sig   Accu-Chek Softclix Lancets lancets Use to check blood sugar daily DX:E11.59   albuterol (VENTOLIN HFA) 108 (90 Base) MCG/ACT inhaler TAKE 2 PUFFS BY MOUTH EVERY 6 HOURS AS NEEDED FOR WHEEZE OR SHORTNESS OF BREATH   amiodarone (PACERONE) 200 MG tablet Take 1 tablet (200 mg total) by mouth daily. Except Saturday and Sunday.   apixaban (ELIQUIS) 5 MG TABS tablet TAKE 1 TABLET BY MOUTH TWICE A DAY   bisacodyl (DULCOLAX) 5 MG EC tablet Take 5 mg by mouth daily as needed for moderate constipation.   Blood Glucose Monitoring Suppl (ACCU-CHEK AVIVA PLUS) w/Device KIT Use to check blood sugar daily DX:E11.59   cholecalciferol (VITAMIN D) 1000 units tablet Take 2,000 Units by mouth daily.   docusate sodium (COLACE) 100 MG capsule Take 200 mg by mouth at bedtime.   ferrous sulfate 325 (65 FE) MG tablet Take 1 tablet (325 mg total) by mouth 2 (two)  times daily with a meal.   furosemide (LASIX) 40 MG tablet Take 2 tablets (80 mg total) by mouth daily. Can take an extra 1/2 tablet by mouth once a week.   gabapentin (NEURONTIN) 100 MG capsule Take 1 capsule (100 mg total) by mouth 3 (three) times daily.   glucose blood (ACCU-CHEK AVIVA PLUS) test strip Use to check blood sugar daily DX:E11.59   metFORMIN (GLUCOPHAGE) 500 MG tablet TAKE 1 TABLET BY MOUTH THREE TIMES A DAY   metolazone (ZAROXOLYN) 2.5 MG tablet Take one tablet by mouth 30 minutes prior to taking your lasix, once every two weeks.   pantoprazole (PROTONIX) 40 MG tablet TAKE 1 TABLET BY MOUTH EVERY DAY   potassium chloride SA (KLOR-CON M) 20 MEQ tablet Take 1 tablet (20 mEq total) by mouth daily.   repaglinide (PRANDIN) 2 MG tablet TAKE 1 TABLET BY MOUTH 3 TIMES DAILY BEFORE MEALS.   simvastatin (ZOCOR) 10 MG tablet TAKE 1 TABLET BY MOUTH EVERY DAY   Tiotropium Bromide-Olodaterol (STIOLTO RESPIMAT) 2.5-2.5 MCG/ACT AERS Inhale 2 puffs into the lungs daily.   vitamin B-12 (CYANOCOBALAMIN) 250 MCG tablet Take 2 tablets (500 mcg total) by mouth daily.   No current facility-administered medications for this visit. (Other)      REVIEW OF SYSTEMS: ROS   Negative for: Constitutional, Gastrointestinal, Neurological, Skin, Genitourinary, Musculoskeletal, HENT, Endocrine, Cardiovascular, Eyes, Respiratory, Psychiatric,  Allergic/Imm, Heme/Lymph Last edited by Hurman Horn, MD on 04/22/2022  9:13 AM.       ALLERGIES No Known Allergies  PAST MEDICAL HISTORY Past Medical History:  Diagnosis Date   Acute diastolic CHF (congestive heart failure) ([place]) 08/04/2020   Arthritis    Atrial fibrillation ([place])    post op; amiodarone and coumadin continued for 3 mos post op   CARCINOMA, SKIN, SQUAMOUS CELL 01/07/2010   Cataract    beginning of cataracts   COLONIC POLYPS, HX OF 04/26/2007   Coronary artery disease    s/p CABG 3/12: L-LAD, S-CFX (Dr. Roxy Manns); EF 45% at cath prior to CABG    DIABETES MELLITUS, TYPE II 04/26/2007   Hyperlipidemia    HYPERTENSION 04/26/2007   NEPHROLITHIASIS, HX OF 04/26/2007   SBO (small bowel obstruction) ([place]) 10/2015   Partial    Shortness of breath dyspnea    with exertion   Unspecified hearing loss 12/18/2008   Past Surgical History:  Procedure Laterality Date   BACK SURGERY     disectomy   CARDIOVERSION N/A 06/08/2018   Procedure: CARDIOVERSION;  Surgeon: Buford Dresser, MD;  Location[facility] END[facility]COPY;  Service: Cardiovascular;  Laterality: N/A;   CARDIOVERSION N/A 07/25/2018   Procedure: CARDIOVERSION;  Surgeon: Fay Records, MD;  Location: [place];  Service: Cardiovascular;  Laterality: N/A;   COLONOSCOPY     CORONARY ARTERY BYPASS GRAFT  01/12/2011   LUMBAR LAMINECTOMY/DECOMPRESSION MICRODISCECTOMY N/A 05/08/2015   Procedure: LUMBAR LAMINECTOMY/DECOMPRESSION MICRODISCECTOMY 1 LEVEL Lumbar four five;  Surgeon: Melina Schools, MD;  Location: [facility];  Service: Orthopedics;  Laterality: N/A;   TOTAL HIP ARTHROPLASTY Left    left on 04/15/07   TYMPANIC MEMBRANE REPAIR  1981    FAMILY HISTORY Family History  Problem Relation Age of Onset   Heart failure Mother    Heart disease Father    Cancer Sister    Cancer Brother    Heart disease Brother    Breast cancer Sister    Colon cancer Neg Hx    Esophageal cancer Neg Hx    Rectal cancer Neg Hx    Stomach cancer Neg Hx     SOCIAL HISTORY Social History   Tobacco Use   Smoking status: Former    Packs/day: 2.00    Years: 30.00    Pack years: 60.00    Types: Cigarettes    Quit date: 01/02/1981    Years since quitting: 41.3   Smokeless tobacco: Never  Vaping Use   Vaping Use: Never used  Substance Use Topics   Alcohol use: No   Drug use: No         OPHTHALMIC EXAM:  Base Eye Exam     Pupils       Pupils   Right PERRL   Left PERRL         Visual Fields       Left Right    Full    Restrictions  Partial inner superior temporal, inferior temporal,  superior nasal, inferior nasal deficiencies         Extraocular Movement       Right Left    Full, Ortho Full, Ortho         Neuro/Psych     Oriented x3: Yes   Mood/Affect: Normal         Dilation     Right eye: 1.0% Mydriacyl, 2.5% Phenylephrine @ 9:16 AM           Slit Lamp  and Fundus Exam     External Exam       Right Left   External Normal Normal         Slit Lamp Exam       Right Left   Lids/Lashes Normal Normal   Conjunctiva/Sclera White and quiet White and quiet   Cornea Clear Clear   Anterior Chamber Deep and quiet Deep and quiet   Iris Round and reactive Round and reactive   Lens 3+ Nuclear sclerosis Posterior chamber intraocular lens   Anterior Vitreous Normal Normal         Fundus Exam       Right Left   Posterior Vitreous Normal    Disc Normal    C/D Ratio 0.5    Macula with no new hemorrhage, large RPED, may have a small tear in the RPE, subfoveal pigmented CNVM present no adjacent hemorrhage    Vessels Normal    Periphery Normal             IMAGING AND PROCEDURES  Imaging and Procedures for 04/22/22  OCT, Retina - OU - Both Eyes       Right Eye Quality was good. Scan locations included subfoveal. Central Foveal Thickness: 296. Progression has improved. Findings include abnormal foveal contour, subretinal fluid, pigment epithelial detachment.   Left Eye Quality was good. Scan locations included subfoveal. Central Foveal Thickness: 289. Progression has been stable. Findings include abnormal foveal contour, retinal drusen , epiretinal membrane.   Notes Significant hemorrhagic PED OD.  Subfoveal location.  Wet AMD OD.  Overall much smaller pigment epithelial detachment since onset of therapy June 2022..  With less thickening, we will repeat injection today, and improved overall since onset of therapy June 2022, no residual subretinal fluid currently.  Vision limited by subretinal fibrosis  OS with minor epiretinal  membrane.  Intermediate ARMD also present with drusen deposits, and old chronic solitary intraretinal cyst, not active over many months thus not an active disease process OS     Intravitreal Injection, Pharmacologic Agent - OD - Right Eye       Time Out 04/22/2022. 9:33 AM. Confirmed correct patient, procedure, site, and patient consented.   Anesthesia Topical anesthesia was used. Anesthetic medications included Lidocaine 4%.   Procedure Preparation included 5% betadine to ocular surface, 10% betadine to eyelids, Ofloxacin , Tobramycin 0.3%. A 30 gauge needle was used.   Injection: 2.5 mg bevacizumab 2.5 MG/0.1ML   Route: Intravitreal, Site: Right Eye   NDC: [phone], Lot: [id number], Expiration date: 06/14/2022   Post-op Post injection exam found visual acuity of at least counting fingers. The patient tolerated the procedure well. There were no complications. The patient received written and verbal post procedure care education. Post injection medications included ocuflox.              ASSESSMENT/PLAN:  Nuclear sclerotic cataract of right eye Agree with proceeding with cataract surgery and track lens implantation right eye to maximize acuity and goal eyes binocular peripheral vision.  Follow-up with Dr. Lucianne Lei as scheduled  Exudative age-related macular degeneration of right eye with active choroidal neovascularization ([place]) Chronic active subfoveal CNVM no enlargement successfully controlled currently today in interval follow-up in interval of 8 weeks  Left epiretinal membrane Minor by OCT no impact on acuity  Intermediate stage nonexudative age-related macular degeneration of both eyes No sign of CNVM OS     ICD-10-CM   1. Exudative age-related macular degeneration of right eye with active choroidal neovascularization ([place])  H35.3211 OCT, Retina - OU - Both Eyes    Intravitreal Injection, Pharmacologic Agent - OD - Right Eye    bevacizumab (AVASTIN) SOSY 2.5 mg    2.  Nuclear sclerotic cataract of right eye  H25.11     3. Left epiretinal membrane  H35.372     4. Intermediate stage nonexudative age-related macular degeneration of both eyes  H35.3132       1.  OD with chronic subfoveal CNVM.  Controlled currently at 8-week interval post Avastin.  2.  Dense cataract in the right eye.  Does hamper adequate visualization medically to follow the CNVM OD.  Moreover affects peripheral vision in this right eye.  3.  Agree with Dr. Lucianne Lei proceed with cataract surgery in the right eye.  4.  We will continue to monitor OU.  OS with a minor epiretinal membrane no interval change  Ophthalmic Meds Ordered this visit:  Meds ordered this encounter  Medications   bevacizumab (AVASTIN) SOSY 2.5 mg       Return in about 8 weeks (around 06/17/2022) for DILATE OU, AVASTIN OCT, OD.  There are no Patient Instructions on file for this visit.   Explained the diagnoses, plan, and follow up with the patient and they expressed understanding.  Patient expressed understanding of the importance of proper follow up care.   Clent Demark Langdon Crosson M.D. Diseases & Surgery of the Retina and Vitreous [facility] 04/22/22     Abbreviations: M myopia (nearsighted); A astigmatism; H hyperopia (farsighted); P presbyopia; Mrx spectacle prescription;  CTL contact lenses; OD right eye; OS left eye; OU both eyes  XT exotropia; ET esotropia; PEK punctate epithelial keratitis; PEE punctate epithelial erosions; DES dry eye syndrome; MGD meibomian gland dysfunction; ATs artificial tears; PFAT's preservative free artificial tears; [place] nuclear sclerotic cataract; PSC posterior subcapsular cataract; ERM epi-retinal membrane; PVD posterior vitreous detachment; RD retinal detachment; DM diabetes mellitus; DR diabetic retinopathy; NPDR non-proliferative diabetic retinopathy; PDR proliferative diabetic retinopathy; CSME clinically significant macular edema; DME diabetic macular edema; dbh  dot blot hemorrhages; CWS cotton wool spot; POAG primary open angle glaucoma; C/D cup-to-disc ratio; HVF humphrey visual field; GVF goldmann visual field; OCT optical coherence tomography; IOP intraocular pressure; BRVO Branch retinal vein occlusion; CRVO central retinal vein occlusion; CRAO central retinal artery occlusion; BRAO branch retinal artery occlusion; RT retinal tear; SB scleral buckle; PPV pars plana vitrectomy; VH Vitreous hemorrhage; PRP panretinal laser photocoagulation; IVK intravitreal kenalog; VMT vitreomacular traction; MH Macular hole;  NVD neovascularization of the disc; NVE neovascularization elsewhere; AREDS age related eye disease study; ARMD age related macular degeneration; POAG primary open angle glaucoma; EBMD epithelial/anterior basement membrane dystrophy; ACIOL anterior chamber intraocular lens; IOL intraocular lens; PCIOL posterior chamber intraocular lens; Phaco/IOL phacoemulsification with intraocular lens placement; [place] photorefractive keratectomy; LASIK laser assisted in situ keratomileusis; HTN hypertension; DM diabetes mellitus; COPD chronic obstructive pulmonary disease

## 2022-04-22 NOTE — Assessment & Plan Note (Signed)
Minor by OCT no impact on acuity

## 2022-04-22 NOTE — Assessment & Plan Note (Signed)
No sign of CNVM OS 

## 2022-04-22 NOTE — Assessment & Plan Note (Signed)
Agree with proceeding with cataract surgery and track lens implantation right eye to maximize acuity and goal eyes binocular peripheral vision.  Follow-up with Dr. Lucianne Lei as scheduled

## 2022-04-22 NOTE — Assessment & Plan Note (Signed)
Chronic active subfoveal CNVM no enlargement successfully controlled currently today in interval follow-up in interval of 8 weeks

## 2022-04-23 ENCOUNTER — Telehealth: Payer: Self-pay | Admitting: *Deleted

## 2022-04-23 NOTE — Telephone Encounter (Signed)
Attempted to contact pt regarding results BMP results including Crea 1.75 BUN 35.  Need to verify medications/dosages and any changes in his recent health.

## 2022-04-24 ENCOUNTER — Telehealth: Payer: Self-pay

## 2022-04-24 DIAGNOSIS — E8770 Fluid overload, unspecified: Secondary | ICD-10-CM

## 2022-04-24 DIAGNOSIS — R0602 Shortness of breath: Secondary | ICD-10-CM

## 2022-04-24 DIAGNOSIS — I480 Paroxysmal atrial fibrillation: Secondary | ICD-10-CM

## 2022-04-24 DIAGNOSIS — I10 Essential (primary) hypertension: Secondary | ICD-10-CM

## 2022-04-24 DIAGNOSIS — I5032 Chronic diastolic (congestive) heart failure: Secondary | ICD-10-CM

## 2022-04-24 DIAGNOSIS — Z79899 Other long term (current) drug therapy: Secondary | ICD-10-CM

## 2022-04-24 MED ORDER — FUROSEMIDE 40 MG PO TABS: ORAL_TABLET | ORAL | 3 refills | Status: DC

## 2022-04-24 NOTE — Telephone Encounter (Signed)
Per Dr Harrington Challenger result note:   Potassium is better BUT Cr is higher   1.75 (was 1.21 3 wks ago)   Up and down  Take 80 then 60 (1 1/2) alternating  BMET, BNP in 3 wks    Left a message for the pt to call back.

## 2022-04-24 NOTE — Telephone Encounter (Signed)
Spoke  with the pt... noted in another phone note.. will close this encounter. See lab result.

## 2022-04-24 NOTE — Telephone Encounter (Signed)
Potassium is better BUT Cr is higher   1.75  (was 1.21 3 wks ago)   Up and down Take 80 then 60 (1 1/2) alternating  BMET, BNP in 3 wks    Pt advised and will have labs 05/21/22.

## 2022-04-28 ENCOUNTER — Other Ambulatory Visit: Payer: Self-pay | Admitting: Internal Medicine

## 2022-05-08 ENCOUNTER — Telehealth: Payer: Medicare Other

## 2022-05-21 ENCOUNTER — Other Ambulatory Visit: Payer: Medicare Other | Admitting: *Deleted

## 2022-05-21 DIAGNOSIS — I5032 Chronic diastolic (congestive) heart failure: Secondary | ICD-10-CM

## 2022-05-21 DIAGNOSIS — R0602 Shortness of breath: Secondary | ICD-10-CM | POA: Diagnosis not present

## 2022-05-21 DIAGNOSIS — I10 Essential (primary) hypertension: Secondary | ICD-10-CM

## 2022-05-21 DIAGNOSIS — I480 Paroxysmal atrial fibrillation: Secondary | ICD-10-CM | POA: Diagnosis not present

## 2022-05-21 DIAGNOSIS — E8770 Fluid overload, unspecified: Secondary | ICD-10-CM | POA: Diagnosis not present

## 2022-05-21 DIAGNOSIS — Z79899 Other long term (current) drug therapy: Secondary | ICD-10-CM

## 2022-05-22 LAB — BASIC METABOLIC PANEL
BUN/Creatinine Ratio: 16 (ref 10–24)
BUN: 22 mg/dL (ref 8–27)
CO2: 23 mmol/L (ref 20–29)
Calcium: 9.2 mg/dL (ref 8.6–10.2)
Chloride: 105 mmol/L (ref 96–106)
Creatinine, Ser: 1.34 mg/dL — ABNORMAL HIGH (ref 0.76–1.27)
Glucose: 146 mg/dL — ABNORMAL HIGH (ref 70–99)
Potassium: 4 mmol/L (ref 3.5–5.2)
Sodium: 144 mmol/L (ref 134–144)
eGFR: 52 mL/min/{1.73_m2} — ABNORMAL LOW (ref >59)

## 2022-05-22 LAB — PRO B NATRIURETIC PEPTIDE: NT-Pro BNP: 641 pg/mL — ABNORMAL HIGH (ref 0–486)

## 2022-06-01 ENCOUNTER — Ambulatory Visit (INDEPENDENT_AMBULATORY_CARE_PROVIDER_SITE_OTHER): Payer: Medicare Other | Admitting: Internal Medicine

## 2022-06-01 ENCOUNTER — Encounter: Payer: Self-pay | Admitting: Internal Medicine

## 2022-06-01 DIAGNOSIS — E1159 Type 2 diabetes mellitus with other circulatory complications: Secondary | ICD-10-CM

## 2022-06-01 DIAGNOSIS — E538 Deficiency of other specified B group vitamins: Secondary | ICD-10-CM | POA: Diagnosis not present

## 2022-06-01 DIAGNOSIS — Z6835 Body mass index (BMI) 35.0-35.9, adult: Secondary | ICD-10-CM

## 2022-06-01 DIAGNOSIS — G8929 Other chronic pain: Secondary | ICD-10-CM | POA: Diagnosis not present

## 2022-06-01 DIAGNOSIS — M545 Low back pain, unspecified: Secondary | ICD-10-CM

## 2022-06-01 DIAGNOSIS — I152 Hypertension secondary to endocrine disorders: Secondary | ICD-10-CM | POA: Diagnosis not present

## 2022-06-01 NOTE — Assessment & Plan Note (Signed)
Cont on Metformin, Prandin

## 2022-06-01 NOTE — Progress Notes (Signed)
Subjective:  Patient ID: [name], male    DOB: 08/30/1935  Age: 87 y.o. MRN: [id number]  CC: No chief complaint on file.   HPI [name] presents for DM, OA, CAD  Outpatient Medications Prior to Visit  Medication Sig Dispense Refill   Accu-Chek Softclix Lancets lancets Use to check blood sugar daily DX:E11.59 100 each 3   albuterol (VENTOLIN HFA) 108 (90 Base) MCG/ACT inhaler TAKE 2 PUFFS BY MOUTH EVERY 6 HOURS AS NEEDED FOR WHEEZE OR SHORTNESS OF BREATH 6.7 each 2   amiodarone (PACERONE) 200 MG tablet Take 1 tablet (200 mg total) by mouth daily. Except Saturday and Sunday. 90 tablet 0   apixaban (ELIQUIS) 5 MG TABS tablet TAKE 1 TABLET BY MOUTH TWICE A DAY 60 tablet 10   bisacodyl (DULCOLAX) 5 MG EC tablet Take 5 mg by mouth daily as needed for moderate constipation.     Blood Glucose Monitoring Suppl (ACCU-CHEK AVIVA PLUS) w/Device KIT Use to check blood sugar daily DX:E11.59 1 kit 0   cholecalciferol (VITAMIN D) 1000 units tablet Take 2,000 Units by mouth daily.     docusate sodium (COLACE) 100 MG capsule Take 200 mg by mouth at bedtime.     ferrous sulfate 325 (65 FE) MG tablet Take 1 tablet (325 mg total) by mouth 2 (two) times daily with a meal. 60 tablet 6   furosemide (LASIX) 40 MG tablet Take 2 tablets (80 mg) by mouth every other day alternating with 1 1/ tablet (60 mg) on the opposite days. 180 tablet 3   gabapentin (NEURONTIN) 100 MG capsule TAKE 1 CAPSULE (100 MG TOTAL) BY MOUTH THREE TIMES DAILY. 270 capsule 3   glucose blood (ACCU-CHEK AVIVA PLUS) test strip Use to check blood sugar daily DX:E11.59 100 each 3   latanoprost (XALATAN) 0.005 % ophthalmic solution Place 1 drop into both eyes daily.     metFORMIN (GLUCOPHAGE) 500 MG tablet TAKE 1 TABLET BY MOUTH THREE TIMES A DAY 270 tablet 3   metolazone (ZAROXOLYN) 2.5 MG tablet Take one tablet by mouth 30 minutes prior to taking your lasix, once every two weeks. 30 tablet 3   pantoprazole (PROTONIX) 40  MG tablet TAKE 1 TABLET BY MOUTH EVERY DAY 90 tablet 3   potassium chloride SA (KLOR-CON M) 20 MEQ tablet Take 1 tablet (20 mEq total) by mouth daily. 90 tablet 3   Propylene Glycol 0.6 % SOLN Place 1 drop into the left eye 2 (two) times daily as needed (for dry eyes).     repaglinide (PRANDIN) 2 MG tablet TAKE 1 TABLET BY MOUTH 3 TIMES DAILY BEFORE MEALS. 270 tablet 3   simvastatin (ZOCOR) 10 MG tablet TAKE 1 TABLET BY MOUTH EVERY DAY 90 tablet 2   Tiotropium Bromide-Olodaterol (STIOLTO RESPIMAT) 2.5-2.5 MCG/ACT AERS Inhale 2 puffs into the lungs daily. 4 g 0   vitamin B-12 (CYANOCOBALAMIN) 250 MCG tablet Take 2 tablets (500 mcg total) by mouth daily.     No facility-administered medications prior to visit.    ROS: Review of Systems  Constitutional:  Positive for fatigue. Negative for appetite change and unexpected weight change.  HENT:  Negative for congestion, nosebleeds, sneezing, sore throat and trouble swallowing.   Eyes:  Negative for itching and visual disturbance.  Respiratory:  Negative for cough.   Cardiovascular:  Negative for chest pain, palpitations and leg swelling.  Gastrointestinal:  Negative for abdominal distention, blood in stool, diarrhea and nausea.  Genitourinary:  Negative for frequency  and hematuria.  Musculoskeletal:  Positive for arthralgias, back pain and gait problem. Negative for joint swelling and neck pain.  Skin:  Negative for rash.  Neurological:  Positive for weakness. Negative for dizziness, tremors and speech difficulty.  Psychiatric/Behavioral:  Positive for sleep disturbance. Negative for agitation, dysphoric mood and suicidal ideas. The patient is not nervous/anxious.     Objective:  BP 140/78 (BP Location: Left Arm, Patient Position: Sitting, Cuff Size: Large)   Pulse 74   Temp 98.4 F (36.9 C) (Oral)   Ht '5\' 8"'  (1.727 m)   Wt 218 lb (98.9 kg)   SpO2 90%   BMI 33.15 kg/m   BP Readings from Last 3 Encounters:  06/01/22 140/78  03/04/22  120/70  01/14/22 140/80    Wt Readings from Last 3 Encounters:  06/01/22 218 lb (98.9 kg)  03/04/22 216 lb (98 kg)  01/14/22 227 lb 9.6 oz (103.2 kg)    Physical Exam Constitutional:      General: He is not in acute distress.    Appearance: He is well-developed. He is obese.     Comments: NAD  Eyes:     Conjunctiva/sclera: Conjunctivae normal.     Pupils: Pupils are equal, round, and reactive to light.  Neck:     Thyroid: No thyromegaly.     Vascular: No JVD.  Cardiovascular:     Rate and Rhythm: Normal rate and regular rhythm.     Heart sounds: Normal heart sounds. No murmur heard.    No friction rub. No gallop.  Pulmonary:     Effort: Pulmonary effort is normal. No respiratory distress.     Breath sounds: Normal breath sounds. No wheezing or rales.  Chest:     Chest wall: No tenderness.  Abdominal:     General: Bowel sounds are normal. There is no distension.     Palpations: Abdomen is soft. There is no mass.     Tenderness: There is no abdominal tenderness. There is no guarding or rebound.  Musculoskeletal:        General: Tenderness present. Normal range of motion.     Cervical back: Normal range of motion.  Lymphadenopathy:     Cervical: No cervical adenopathy.  Skin:    General: Skin is warm and dry.     Findings: No rash.  Neurological:     Mental Status: He is alert and oriented to person, place, and time.     Cranial Nerves: No cranial nerve deficit.     Motor: No abnormal muscle tone.     Coordination: Coordination normal.     Gait: Gait abnormal.     Deep Tendon Reflexes: Reflexes are normal and symmetric.  Psychiatric:        Behavior: Behavior normal.        Thought Content: Thought content normal.        Judgment: Judgment normal.   Antalgic gait  Lab Results  Component Value Date   WBC 6.1 01/14/2022   HGB 13.5 01/14/2022   HCT 38.5 01/14/2022   PLT 227 01/14/2022   GLUCOSE 146 (H) 05/21/2022   CHOL 114 01/14/2022   TRIG 120 01/14/2022    HDL 38 (L) 01/14/2022   LDLCALC 54 01/14/2022   ALT 23 03/04/2022   AST 26 03/04/2022   NA 144 05/21/2022   K 4.0 05/21/2022   CL 105 05/21/2022   CREATININE 1.34 (H) 05/21/2022   BUN 22 05/21/2022   CO2 23 05/21/2022   TSH 12.300 (  H) 01/14/2022   PSA 1.18 07/02/2017   INR 2.3 03/03/2012   HGBA1C 6.9 (H) 03/04/2022   MICROALBUR 0.7 04/04/2018    DG Chest 2 View  Result Date: 01/16/2022 CLINICAL DATA:  Dyspnea. Shortness of breath. Paroxysmal atrial fibrillation. Coronary artery disease involving native coronary artery of native heart without angina. Shortness of breath. Anemia. Chronic diastolic heart failure. Medication management. Mixed hyperlipidemia. EXAM: CHEST - 2 VIEW COMPARISON:  08/04/2020, chest CT 06/06/2020 FINDINGS: Patient is post median sternotomy. Stable cardiomegaly. Chronic blunting of the right costophrenic angle. No large pleural effusion. Vascular congestion. No convincing pulmonary edema. No confluent consolidation. Biapical pleuroparenchymal scarring. No pneumothorax. No acute osseous findings. IMPRESSION: Cardiomegaly with vascular congestion. No convincing pulmonary edema. Electronically Signed   By: Keith Rake M.D.   On: 01/16/2022 21:23    Assessment & Plan:   Problem List Items Addressed This Visit     B12 deficiency    On B12      Back pain    Pt lost wt Blue-Emu cream was recommended to use 2-3 times a day      Diabetes mellitus, type 2 (HCC) (Chronic)    Cont on Metformin, Prandin      Hypertension associated with diabetes ([place]) (Chronic)    Cont on Metoprolol      Obesity    Wt Readings from Last 3 Encounters:  06/01/22 218 lb (98.9 kg)  03/04/22 216 lb (98 kg)  01/14/22 227 lb 9.6 oz (103.2 kg)  cont w/wt loss          No orders of the defined types were placed in this encounter.     Follow-up: Return in about 3 months (around 09/01/2022) for a follow-up visit.  Walker Kehr, MD

## 2022-06-01 NOTE — Assessment & Plan Note (Signed)
On B12 

## 2022-06-01 NOTE — Assessment & Plan Note (Addendum)
Wt Readings from Last 3 Encounters:  06/01/22 218 lb (98.9 kg)  03/04/22 216 lb (98 kg)  01/14/22 227 lb 9.6 oz (103.2 kg)  cont w/wt loss

## 2022-06-01 NOTE — Assessment & Plan Note (Signed)
Cont on Metoprolol

## 2022-06-01 NOTE — Assessment & Plan Note (Signed)
Pt lost wt Blue-Emu cream was recommended to use 2-3 times a day

## 2022-06-03 ENCOUNTER — Ambulatory Visit: Payer: Medicare Other | Admitting: Internal Medicine

## 2022-06-03 DIAGNOSIS — H25811 Combined forms of age-related cataract, right eye: Secondary | ICD-10-CM | POA: Diagnosis not present

## 2022-06-03 DIAGNOSIS — H268 Other specified cataract: Secondary | ICD-10-CM | POA: Diagnosis not present

## 2022-06-05 ENCOUNTER — Ambulatory Visit: Payer: Medicare Other

## 2022-06-05 ENCOUNTER — Telehealth: Payer: Self-pay

## 2022-06-05 NOTE — Telephone Encounter (Signed)
Called patient x 4 with no answer, patient may reschedule for next available appointment.  L.Wilson,LPN

## 2022-06-08 ENCOUNTER — Encounter (INDEPENDENT_AMBULATORY_CARE_PROVIDER_SITE_OTHER): Payer: Self-pay

## 2022-06-08 ENCOUNTER — Ambulatory Visit (INDEPENDENT_AMBULATORY_CARE_PROVIDER_SITE_OTHER): Payer: Medicare Other | Admitting: Nurse Practitioner

## 2022-06-08 ENCOUNTER — Telehealth: Payer: Self-pay | Admitting: *Deleted

## 2022-06-08 DIAGNOSIS — Z01810 Encounter for preprocedural cardiovascular examination: Secondary | ICD-10-CM

## 2022-06-08 DIAGNOSIS — H353211 Exudative age-related macular degeneration, right eye, with active choroidal neovascularization: Secondary | ICD-10-CM | POA: Diagnosis not present

## 2022-06-08 DIAGNOSIS — H59021 Cataract (lens) fragments in eye following cataract surgery, right eye: Secondary | ICD-10-CM | POA: Diagnosis not present

## 2022-06-08 DIAGNOSIS — H35372 Puckering of macula, left eye: Secondary | ICD-10-CM | POA: Diagnosis not present

## 2022-06-08 DIAGNOSIS — T8522XA Displacement of intraocular lens, initial encounter: Secondary | ICD-10-CM | POA: Diagnosis not present

## 2022-06-08 DIAGNOSIS — H43393 Other vitreous opacities, bilateral: Secondary | ICD-10-CM | POA: Diagnosis not present

## 2022-06-08 DIAGNOSIS — H353122 Nonexudative age-related macular degeneration, left eye, intermediate dry stage: Secondary | ICD-10-CM | POA: Diagnosis not present

## 2022-06-08 NOTE — Telephone Encounter (Signed)
   Pre-operative Risk Assessment    Patient Name: [name]  DOB: [birth year] MRN: [id number]      Request for Surgical Clearance    Procedure:   RETINAL SURGERY  Date of Surgery:  Clearance 06/10/22 URGENT                               Surgeon:  DR. Ernst Breach Surgeon's Group or Practice Name:  [facility] Phone number:  [phone]  Fax number:  [phone]   Type of Clearance Requested:   - Medical  - Pharmacy:  Hold Apixaban (Eliquis) BEGIN HOLDING AS OF TODAY 06/08/22   Type of Anesthesia:  MAC   Additional requests/questions:    Jiles Prows   06/08/2022, 12:37 PM

## 2022-06-08 NOTE — Telephone Encounter (Signed)
Pt agreeable to plan of care for tele pre op add -on today at 3:40, procedure is set fot 06/10/22. Pt did take his Eliquis this morning. I have instructed the pt to not take anymore Eliquis at this point. Pt agreeable to plan of care. Med rec and consent are done.

## 2022-06-08 NOTE — Telephone Encounter (Signed)
   Name: [name]  DOB: June 26, 1935  MRN: [id number]  Primary Cardiologist: [name], MD   Preoperative team, please contact this patient and set up a phone call appointment for further preoperative risk assessment. Please obtain consent and complete medication review. Thank you for your help.  I confirm that guidance regarding antiplatelet and oral anticoagulation therapy has been completed and, if necessary, noted below.  Procedure: RETINAL SURGERY Date of procedure: 06/10/22     CHA2DS2-VASc Score = 6   This indicates a 9.7% annual risk of stroke. The patient's score is based upon: CHF History: 1 HTN History: 1 Diabetes History: 1 Stroke History: 0 Vascular Disease History: 1 Age Score: 2 Gender Score: 0       CrCl 45 ml/min   Per office protocol, patient can hold Eliquis for 2 days prior to procedure.   Lenna Sciara, NP 06/08/2022, 2:15 PM [facility] [address] [place], [place] [address]

## 2022-06-08 NOTE — Telephone Encounter (Signed)
Pt agreeable to plan of care for tele pre op add -on today at 3:40, procedure is set fot 06/10/22. Pt did take his Eliquis this morning. I have instructed the pt to not take anymore Eliquis at this point. Pt agreeable to plan of care. Med rec and consent are done.      Patient Consent for Virtual Visit        [name] has provided verbal consent on 06/08/2022 for a virtual visit (video or telephone).   CONSENT FOR VIRTUAL VISIT FOR:  [name]  By participating in this virtual visit I agree to the following:  I hereby voluntarily request, consent and authorize [facility] and its employed or contracted physicians, physician assistants, nurse practitioners or other licensed health care professionals (the Practitioner), to provide me with telemedicine health care services (the "Services") as deemed necessary by the treating Practitioner. I acknowledge and consent to receive the Services by the Practitioner via telemedicine. I understand that the telemedicine visit will involve communicating with the Practitioner through live audiovisual communication technology and the disclosure of certain medical information by electronic transmission. I acknowledge that I have been given the opportunity to request an in-person assessment or other available alternative prior to the telemedicine visit and am voluntarily participating in the telemedicine visit.  I understand that I have the right to withhold or withdraw my consent to the use of telemedicine in the course of my care at any time, without affecting my right to future care or treatment, and that the Practitioner or I may terminate the telemedicine visit at any time. I understand that I have the right to inspect all information obtained and/or recorded in the course of the telemedicine visit and may receive copies of available information for a reasonable fee.  I understand that some of the potential risks of receiving the Services  via telemedicine include:  Delay or interruption in medical evaluation due to technological equipment failure or disruption; Information transmitted may not be sufficient (e.g. poor resolution of images) to allow for appropriate medical decision making by the Practitioner; and/or  In rare instances, security protocols could fail, causing a breach of personal health information.  Furthermore, I acknowledge that it is my responsibility to provide information about my medical history, conditions and care that is complete and accurate to the best of my ability. I acknowledge that Practitioner's advice, recommendations, and/or decision may be based on factors not within their control, such as incomplete or inaccurate data provided by me or distortions of diagnostic images or specimens that may result from electronic transmissions. I understand that the practice of medicine is not an exact science and that Practitioner makes no warranties or guarantees regarding treatment outcomes. I acknowledge that a copy of this consent can be made available to me via my patient portal ([facility]), or I can request a printed copy by calling the office of [facility].    I understand that my insurance will be billed for this visit.   I have read or had this consent read to me. I understand the contents of this consent, which adequately explains the benefits and risks of the Services being provided via telemedicine.  I have been provided ample opportunity to ask questions regarding this consent and the Services and have had my questions answered to my satisfaction. I give my informed consent for the services to be provided through the use of telemedicine in my medical care

## 2022-06-08 NOTE — Telephone Encounter (Signed)
Patient with diagnosis of afib on Eliquis for anticoagulation.    Procedure: RETINAL SURGERY Date of procedure: 06/10/22   CHA2DS2-VASc Score = 6   This indicates a 9.7% annual risk of stroke. The patient's score is based upon: CHF History: 1 HTN History: 1 Diabetes History: 1 Stroke History: 0 Vascular Disease History: 1 Age Score: 2 Gender Score: 0      CrCl 45 ml/min  Per office protocol, patient can hold Eliquis for 2 days prior to procedure.    **This guidance is not considered finalized until pre-operative APP has relayed final recommendations.**

## 2022-06-08 NOTE — Progress Notes (Signed)
Virtual Visit via Telephone Note   Because of [name]'s co-morbid illnesses, he is at least at moderate risk for complications without adequate follow up.  This format is felt to be most appropriate for this patient at this time.  The patient did not have access to video technology/had technical difficulties with video requiring transitioning to audio format only (telephone).  All issues noted in this document were discussed and addressed.  No physical exam could be performed with this format.  Please refer to the patient's chart for his consent to telehealth for [facility].  Evaluation Performed:  Preoperative cardiovascular risk assessment _____________   Date:  06/08/2022   Patient ID:  MORT [name], DOB [birth year], MRN [id number] Patient Location:  Home Provider location:   Office  Primary Care Provider:  Cassandria Anger, MD Primary Cardiologist:  [name], MD  Chief Complaint / Patient Profile   87 y.o. y/o male with a h/o CAD s/p CABG x2 in 2012, paroxysmal atrial fibrillation, hypertension, hyperlipidemia, type 2 diabetes, and nephrolithiasis who is pending retinal surgery on 06/10/2022 with Dr. Ernst Breach of [facility] and presents today for telephonic preoperative cardiovascular risk assessment.  Past Medical History    Past Medical History:  Diagnosis Date   Acute diastolic CHF (congestive heart failure) ([place]) 08/04/2020   Arthritis    Atrial fibrillation ([place])    post op; amiodarone and coumadin continued for 3 mos post op   CARCINOMA, SKIN, SQUAMOUS CELL 01/07/2010   Cataract    beginning of cataracts   COLONIC POLYPS, HX OF 04/26/2007   Coronary artery disease    s/p CABG 3/12: L-LAD, S-CFX (Dr. Roxy Manns); EF 45% at cath prior to CABG   DIABETES MELLITUS, TYPE II 04/26/2007   Hyperlipidemia    HYPERTENSION 04/26/2007   NEPHROLITHIASIS, HX OF 04/26/2007   SBO (small bowel obstruction) ([place]) 10/2015   Partial    Shortness  of breath dyspnea    with exertion   Unspecified hearing loss 12/18/2008   Past Surgical History:  Procedure Laterality Date   BACK SURGERY     disectomy   CARDIOVERSION N/A 06/08/2018   Procedure: CARDIOVERSION;  Surgeon: Buford Dresser, MD;  [facility] ENDOSCOPY;  Service: Cardiovascular;  Laterality: N/A;   CARDIOVERSION N/A 07/25/2018   Procedure: CARDIOVERSION;  Surgeon: Fay Records, MD;  Location: [facility];  Service: Cardiovascular;  Laterality: N/A;   COLONOSCOPY     CORONARY ARTERY BYPASS GRAFT  01/12/2011   LUMBAR LAMINECTOMY/DECOMPRESSION MICRODISCECTOMY N/A 05/08/2015   Procedure: LUMBAR LAMINECTOMY/DECOMPRESSION MICRODISCECTOMY 1 LEVEL Lumbar four five;  Surgeon: Melina Schools, MD;  Location: [facility];  Service: Orthopedics;  Laterality: N/A;   TOTAL HIP ARTHROPLASTY Left    left on 04/15/07   TYMPANIC MEMBRANE REPAIR  1981    Allergies  No Known Allergies  History of Present Illness    [name] is a 87 y.o. male who presents via audio/video conferencing for a telehealth visit today.  Pt was last seen in cardiology clinic on 01/14/2022 by Dr. Harrington Challenger.  At that time [name] was doing well. The patient is now pending procedure as outlined above. Since his last visit, he has been stable from a cardiac standpoint. He denies chest pain, palpitations, dyspnea, pnd, orthopnea, n, v, dizziness, syncope, edema, weight gain, or early satiety. All other systems reviewed and are otherwise negative except as noted above.   Home Medications    Prior to Admission medications  Medication Sig Start Date End Date Taking? Authorizing Provider  Accu-Chek Softclix Lancets lancets Use to check blood sugar daily DX:E11.59 11/01/19   [name], Evie Lacks, MD  albuterol (VENTOLIN HFA) 108 (90 Base) MCG/ACT inhaler TAKE 2 PUFFS BY MOUTH EVERY 6 HOURS AS NEEDED FOR WHEEZE OR SHORTNESS OF BREATH 10/21/20   Collene Gobble, MD  amiodarone (PACERONE) 200 MG tablet Take 1  tablet (200 mg total) by mouth daily. Except Saturday and Sunday. 04/29/22   Fay Records, MD  apixaban (ELIQUIS) 5 MG TABS tablet TAKE 1 TABLET BY MOUTH TWICE A DAY 03/04/22   Fay Records, MD  bisacodyl (DULCOLAX) 5 MG EC tablet Take 5 mg by mouth daily as needed for moderate constipation.    [provider]  Blood Glucose Monitoring Suppl (ACCU-CHEK AVIVA PLUS) w/Device KIT Use to check blood sugar daily DX:E11.59 11/01/19   [name], Evie Lacks, MD  cholecalciferol (VITAMIN D) 1000 units tablet Take 2,000 Units by mouth daily.    [provider]  docusate sodium (COLACE) 100 MG capsule Take 200 mg by mouth at bedtime.    [provider]  ferrous sulfate 325 (65 FE) MG tablet Take 1 tablet (325 mg total) by mouth 2 (two) times daily with a meal. 04/04/21   Fay Records, MD  furosemide (LASIX) 40 MG tablet Take 2 tablets (80 mg) by mouth every other day alternating with 1 1/ tablet (60 mg) on the opposite days. 04/24/22   Fay Records, MD  gabapentin (NEURONTIN) 100 MG capsule TAKE 1 CAPSULE (100 MG TOTAL) BY MOUTH THREE TIMES DAILY. 04/28/22   [name], Evie Lacks, MD  glucose blood (ACCU-CHEK AVIVA PLUS) test strip Use to check blood sugar daily DX:E11.59 10/27/21   [name], Evie Lacks, MD  latanoprost (XALATAN) 0.005 % ophthalmic solution Place 1 drop into both eyes daily. 05/13/20   [provider]  metFORMIN (GLUCOPHAGE) 500 MG tablet TAKE 1 TABLET BY MOUTH THREE TIMES A DAY 02/26/22   [name], Evie Lacks, MD  metolazone (ZAROXOLYN) 2.5 MG tablet Take one tablet by mouth 30 minutes prior to taking your lasix, once every two weeks. 02/13/22   Deboraha Sprang, MD  pantoprazole (PROTONIX) 40 MG tablet TAKE 1 TABLET BY MOUTH EVERY DAY 01/30/22   [name], Evie Lacks, MD  potassium chloride SA (KLOR-CON M) 20 MEQ tablet Take 1 tablet (20 mEq total) by mouth daily. 04/07/22   Fay Records, MD  Propylene Glycol 0.6 % SOLN Place 1 drop into the left eye 2 (two) times  daily as needed (for dry eyes).    [provider]  repaglinide (PRANDIN) 2 MG tablet TAKE 1 TABLET BY MOUTH 3 TIMES DAILY BEFORE MEALS. 10/27/21   [name], Evie Lacks, MD  simvastatin (ZOCOR) 10 MG tablet TAKE 1 TABLET BY MOUTH EVERY DAY 01/02/22   Fay Records, MD  Tiotropium Bromide-Olodaterol (STIOLTO RESPIMAT) 2.5-2.5 MCG/ACT AERS Inhale 2 puffs into the lungs daily. 07/02/20   Martyn Ehrich, NP  vitamin B-12 (CYANOCOBALAMIN) 250 MCG tablet Take 2 tablets (500 mcg total) by mouth daily. 04/04/21   Fay Records, MD    Physical Exam    Vital Signs:  ISAHI GODWIN does not have vital signs available for review today.  Given telephonic nature of communication, physical exam is limited. AAOx3. NAD. Normal affect.  Speech and respirations are unlabored.  Accessory Clinical Findings    None  Assessment & Plan    1.  Preoperative  Cardiovascular Risk Assessment: According to the Revised Cardiac Risk Index (RCRI), his Perioperative Risk of Major Cardiac Event is (%): 0.9. His Functional Capacity in METs is: 5.07 according to the [facility] Activity Status Index (DASI).Therefore, based on ACC/AHA guidelines, patient would be at acceptable risk for the planned procedure without further cardiovascular testing.  Patient with diagnosis of afib on Eliquis for anticoagulation.     Procedure: RETINAL SURGERY Date of procedure: 06/10/22     CHA2DS2-VASc Score = 6   This indicates a 9.7% annual risk of stroke. The patient's score is based upon: CHF History: 1 HTN History: 1 Diabetes History: 1 Stroke History: 0 Vascular Disease History: 1 Age Score: 2 Gender Score: 0       CrCl 45 ml/min   Per office protocol, patient can hold Eliquis for 2 days prior to procedure.  Please resume Eliquis as soon as possible postprocedure, the discretion of the surgeon.  A copy of this note will be routed to requesting surgeon.  Time:   Today, I have spent 4 minutes with the patient with  telehealth technology discussing medical history, symptoms, and management plan.     Lenna Sciara, NP  06/08/2022, 3:47 PM

## 2022-06-09 ENCOUNTER — Ambulatory Visit (INDEPENDENT_AMBULATORY_CARE_PROVIDER_SITE_OTHER): Payer: Medicare Other | Admitting: Ophthalmology

## 2022-06-09 ENCOUNTER — Encounter (INDEPENDENT_AMBULATORY_CARE_PROVIDER_SITE_OTHER): Payer: Self-pay | Admitting: Ophthalmology

## 2022-06-09 DIAGNOSIS — H2511 Age-related nuclear cataract, right eye: Secondary | ICD-10-CM | POA: Diagnosis not present

## 2022-06-09 DIAGNOSIS — Q130 Coloboma of iris: Secondary | ICD-10-CM | POA: Diagnosis not present

## 2022-06-09 DIAGNOSIS — H31411 Hemorrhagic choroidal detachment, right eye: Secondary | ICD-10-CM

## 2022-06-09 DIAGNOSIS — T8522XA Displacement of intraocular lens, initial encounter: Secondary | ICD-10-CM

## 2022-06-09 NOTE — Patient Instructions (Signed)
Patient to continue the topical eye medications as instructed by Dr. Lucianne Lei.  Stop Eliquis after his dose on Sunday do not use Eliquis on Monday or Tuesday next week  Patient scheduled for 7:30 AM surgical repair at [place]

## 2022-06-09 NOTE — Progress Notes (Signed)
06/09/2022     CHIEF COMPLAINT Patient presents for  Chief Complaint  Patient presents with   Retina Evaluation      HISTORY OF PRESENT ILLNESS: [name] is a 87 y.o. male who presents to the clinic today for:   HPI     Retina Evaluation           Laterality: right eye   Associated Symptoms: Floaters         Comments   This with Dr. Patient is status post cataract surgery some 7 days previous with Dr. Lucianne Lei suffering at that time intraoperative disruption of the posterior capsule of the right eye , With  posterior dislocation of three-piece intraocular lens into the vitreous cavity initial evaluation intraoperatively found to be "too hot" to proceed with a surgical vitrectomy under the initial evaluation Dr. Richrd Humbles.  he presents today as patient would like to resume and maintain total retinal care in this practice.      Last edited by Hurman Horn, MD on 06/09/2022  4:43 PM.      Referring physician: Lisabeth Pick, MD [place],  [place] [address]  HISTORICAL INFORMATION:   Selected notes from the MEDICAL RECORD NUMBER    Lab Results  Component Value Date   HGBA1C 6.9 (H) 03/04/2022     CURRENT MEDICATIONS: Current Outpatient Medications (Ophthalmic Drugs)  Medication Sig   latanoprost (XALATAN) 0.005 % ophthalmic solution Place 1 drop into both eyes daily.   Propylene Glycol 0.6 % SOLN Place 1 drop into the left eye 2 (two) times daily as needed (for dry eyes).   No current facility-administered medications for this visit. (Ophthalmic Drugs)   Current Outpatient Medications (Other)  Medication Sig   Accu-Chek Softclix Lancets lancets Use to check blood sugar daily DX:E11.59   albuterol (VENTOLIN HFA) 108 (90 Base) MCG/ACT inhaler TAKE 2 PUFFS BY MOUTH EVERY 6 HOURS AS NEEDED FOR WHEEZE OR SHORTNESS OF BREATH   amiodarone (PACERONE) 200 MG tablet TAKE 1 TABLET (200 MG TOTAL) BY MOUTH DAILY. EXCEPT SATURDAY AND SUNDAY.    apixaban (ELIQUIS) 5 MG TABS tablet TAKE 1 TABLET BY MOUTH TWICE A DAY   bisacodyl (DULCOLAX) 5 MG EC tablet Take 5 mg by mouth daily as needed for moderate constipation.   Blood Glucose Monitoring Suppl (ACCU-CHEK AVIVA PLUS) w/Device KIT Use to check blood sugar daily DX:E11.59   cholecalciferol (VITAMIN D) 1000 units tablet Take 2,000 Units by mouth daily.   docusate sodium (COLACE) 100 MG capsule Take 200 mg by mouth at bedtime.   ferrous sulfate 325 (65 FE) MG tablet Take 1 tablet (325 mg total) by mouth 2 (two) times daily with a meal.   furosemide (LASIX) 40 MG tablet Take 2 tablets (80 mg) by mouth every other day alternating with 1 1/ tablet (60 mg) on the opposite days.   gabapentin (NEURONTIN) 100 MG capsule TAKE 1 CAPSULE (100 MG TOTAL) BY MOUTH THREE TIMES DAILY.   glucose blood (ACCU-CHEK AVIVA PLUS) test strip Use to check blood sugar daily DX:E11.59   metFORMIN (GLUCOPHAGE) 500 MG tablet TAKE 1 TABLET BY MOUTH THREE TIMES A DAY   metolazone (ZAROXOLYN) 2.5 MG tablet Take one tablet by mouth 30 minutes prior to taking your lasix, once every two weeks.   pantoprazole (PROTONIX) 40 MG tablet TAKE 1 TABLET BY MOUTH EVERY DAY   potassium chloride SA (KLOR-CON M) 20 MEQ tablet Take 1 tablet (20 mEq total) by mouth  daily.   repaglinide (PRANDIN) 2 MG tablet TAKE 1 TABLET BY MOUTH 3 TIMES DAILY BEFORE MEALS.   simvastatin (ZOCOR) 10 MG tablet TAKE 1 TABLET BY MOUTH EVERY DAY   Tiotropium Bromide-Olodaterol (STIOLTO RESPIMAT) 2.5-2.5 MCG/ACT AERS Inhale 2 puffs into the lungs daily.   vitamin B-12 (CYANOCOBALAMIN) 250 MCG tablet Take 2 tablets (500 mcg total) by mouth daily.   No current facility-administered medications for this visit. (Other)      REVIEW OF SYSTEMS: ROS   Negative for: Constitutional, Gastrointestinal, Neurological, Skin, Genitourinary, Musculoskeletal, HENT, Endocrine, Cardiovascular, Eyes, Respiratory, Psychiatric, Allergic/Imm, Heme/Lymph Last edited by Hurman Horn, MD on 06/09/2022  4:06 PM.       ALLERGIES No Known Allergies  PAST MEDICAL HISTORY Past Medical History:  Diagnosis Date   Acute diastolic CHF (congestive heart failure) ([place]) 08/04/2020   Arthritis    Atrial fibrillation ([place])    post op; amiodarone and coumadin continued for 3 mos post op   CARCINOMA, SKIN, SQUAMOUS CELL 01/07/2010   Cataract    beginning of cataracts   COLONIC POLYPS, HX OF 04/26/2007   Coronary artery disease    s/p CABG 3/12: L-LAD, S-CFX (Dr. Roxy Manns); EF 45% at cath prior to CABG   DIABETES MELLITUS, TYPE II 04/26/2007   Hyperlipidemia    HYPERTENSION 04/26/2007   NEPHROLITHIASIS, HX OF 04/26/2007   Nuclear sclerotic cataract of right eye 10/02/2021   Surgery Dr. Lucianne Lei, 06-03-2022 with posterior capsule disruption posterior dislocation lens, disruption of pupil margin superiorly   SBO (small bowel obstruction) ([place]) 10/2015   Partial    Shortness of breath dyspnea    with exertion   Unspecified hearing loss 12/18/2008   Past Surgical History:  Procedure Laterality Date   BACK SURGERY     disectomy   CARDIOVERSION N/A 06/08/2018   Procedure: CARDIOVERSION;  Surgeon: Buford Dresser, MD;  Location: [facility];  Service: Cardiovascular;  Laterality: N/A;   CARDIOVERSION N/A 07/25/2018   Procedure: CARDIOVERSION;  Surgeon: Fay Records, MD;  Location: [facility];  Service: Cardiovascular;  Laterality: N/A;   COLONOSCOPY     CORONARY ARTERY BYPASS GRAFT  01/12/2011   LUMBAR LAMINECTOMY/DECOMPRESSION MICRODISCECTOMY N/A 05/08/2015   Procedure: LUMBAR LAMINECTOMY/DECOMPRESSION MICRODISCECTOMY 1 LEVEL Lumbar four five;  Surgeon: Melina Schools, MD;  Location: [facility];  Service: Orthopedics;  Laterality: N/A;   TOTAL HIP ARTHROPLASTY Left    left on 04/15/07   TYMPANIC MEMBRANE REPAIR  1981    FAMILY HISTORY Family History  Problem Relation Age of Onset   Heart failure Mother    Heart disease Father    Cancer Sister    Cancer Brother    Heart  disease Brother    Breast cancer Sister    Colon cancer Neg Hx    Esophageal cancer Neg Hx    Rectal cancer Neg Hx    Stomach cancer Neg Hx     SOCIAL HISTORY Social History   Tobacco Use   Smoking status: Former    Packs/day: 2.00    Years: 30.00    Total pack years: 60.00    Types: Cigarettes    Quit date: 01/02/1981    Years since quitting: 41.6   Smokeless tobacco: Never  Vaping Use   Vaping Use: Never used  Substance Use Topics   Alcohol use: No   Drug use: No         OPHTHALMIC EXAM:  Base Eye Exam     Visual Acuity (ETDRS)  Right Left   Dist [place] CF at 2'    Dist cc  20/20    Correction: Glasses         Tonometry (Tonopen, 4:09 PM)       Right Left   Pressure 20 16           Slit Lamp and Fundus Exam     External Exam       Right Left   External Normal Normal         Slit Lamp Exam       Right Left   Lids/Lashes Normal    Conjunctiva/Sclera 1+ Injection    Cornea Clear, wound secure    Anterior Chamber Vitstrand from inferior portion of pupil to corneal wound 7 o'clock position, with cortical remnant inferior AC angle    Iris Keyhole large meant superiorly of pupil margin    Lens Three-piece IOL haptic seen inferior on the edge of remnant of posterior capsule    Anterior Vitreous Small cells anterior visit          Fundus Exam       Right Left   Posterior Vitreous Central vitreous floaters, Posterior vitreous detachment    Disc Normal    C/D Ratio 0.4    Macula Normal    Vessels Normal    Periphery Hemorrhagic choroidal detachment superonasal extending almost posteriorly to the nerve.             IMAGING AND PROCEDURES  Imaging and Procedures for 08/10/22  B-Scan Ultrasound - OD - Right Eye       Quality was good. Findings included choroidal detachment, vitreous opacities.   Notes Local hemorrhagic choroidal detachment temporally and 1 nasally             ASSESSMENT/PLAN:  Keyhole pupil of  right eye Will likely need suture iridoplasty to round the pupil and prevent glare  Dislocated intraocular lens, initial encounter OD, with aphakic condition with intraocular lens displaced.  Surgical undertaking will be vitrectomy, reposition intraocular lens likely into the sulcus.  Unlikely to need Yamane scleral tunnel externalization of Prolene haptic's but will be ready for this.  Irregular pupil will likely need suture aortoplasty with Prolene suture to around the pupil and to minimize risk of ongoing glare  Choroidal detachment, hemorrhagic, right eye Superonasal hemorrhagic cortical detachment in a patient who requires ongoing therapy with Eliquis.  At the time of surgical intervention likely that this low-lying hemorrhagic choroidal detachment will not expand however will be ready to perform cutdown sclerotomy if concerns for progression occur.  Superonasal quadrant.     ICD-10-CM   1. Nuclear sclerotic cataract of right eye  H25.11     2. Keyhole pupil of right eye  Q13.0     3. Dislocated intraocular lens, initial encounter  T85.22XA B-Scan Ultrasound - OD - Right Eye    4. Choroidal detachment, hemorrhagic, right eye  H31.411       1.  Surgical intervention right eye will be vitrectomy, repositioning of intraocular lens and careful monitoring of the superonasal hemorrhagic choroidal detachment posteriorly.  Prompt surgical cutdown with sclerotomy will be undertaken if required at the time of surgery.  OD  2.  OD also has surgical induced keyhole iridectomy which will likely require suture ureteroplasty to prevent permanent glare, at the close of the procedure  3.  Surgical repair is scheduled for 06-16-2022, Tuesday morning 7:30 AM  Ophthalmic Meds Ordered this visit:  No orders of the  defined types were placed in this encounter.      Return ,, [facility], for Schedule vitrectomy, repositioning of intraocular lens into the sulcus right eye ,  OD.  Patient Instructions  Patient to continue the topical eye medications as instructed by Dr. Lucianne Lei.  Stop Eliquis after his dose on Sunday do not use Eliquis on Monday or Tuesday next week  Patient scheduled for 7:30 AM surgical repair at [facility] the diagnoses, plan, and follow up with the patient and they expressed understanding.  Patient expressed understanding of the importance of proper follow up care.   Clent Demark Elisia Stepp M.D. Diseases & Surgery of the Retina and Vitreous Retina & Diabetic [place] 08/10/22     Abbreviations: M myopia (nearsighted); A astigmatism; H hyperopia (farsighted); P presbyopia; Mrx spectacle prescription;  CTL contact lenses; OD right eye; OS left eye; OU both eyes  XT exotropia; ET esotropia; PEK punctate epithelial keratitis; PEE punctate epithelial erosions; DES dry eye syndrome; MGD meibomian gland dysfunction; ATs artificial tears; PFAT's preservative free artificial tears; [place] nuclear sclerotic cataract; PSC posterior subcapsular cataract; ERM epi-retinal membrane; PVD posterior vitreous detachment; RD retinal detachment; DM diabetes mellitus; DR diabetic retinopathy; NPDR non-proliferative diabetic retinopathy; PDR proliferative diabetic retinopathy; CSME clinically significant macular edema; DME diabetic macular edema; dbh dot blot hemorrhages; CWS cotton wool spot; POAG primary open angle glaucoma; C/D cup-to-disc ratio; HVF humphrey visual field; GVF goldmann visual field; OCT optical coherence tomography; IOP intraocular pressure; BRVO Branch retinal vein occlusion; CRVO central retinal vein occlusion; CRAO central retinal artery occlusion; BRAO branch retinal artery occlusion; RT retinal tear; SB scleral buckle; PPV pars plana vitrectomy; VH Vitreous hemorrhage; PRP panretinal laser photocoagulation; IVK intravitreal kenalog; VMT vitreomacular traction; MH Macular hole;  NVD neovascularization of the disc; NVE  neovascularization elsewhere; AREDS age related eye disease study; ARMD age related macular degeneration; POAG primary open angle glaucoma; EBMD epithelial/anterior basement membrane dystrophy; ACIOL anterior chamber intraocular lens; IOL intraocular lens; PCIOL posterior chamber intraocular lens; Phaco/IOL phacoemulsification with intraocular lens placement; [place] photorefractive keratectomy; LASIK laser assisted in situ keratomileusis; HTN hypertension; DM diabetes mellitus; COPD chronic obstructive pulmonary disease

## 2022-06-09 NOTE — Assessment & Plan Note (Signed)
OD, with aphakic condition with intraocular lens displaced.  Surgical undertaking will be vitrectomy, reposition intraocular lens likely into the sulcus.  Unlikely to need Yamane scleral tunnel externalization of Prolene haptic's but will be ready for this.  Irregular pupil will likely need suture aortoplasty with Prolene suture to around the pupil and to minimize risk of ongoing glare

## 2022-06-09 NOTE — Assessment & Plan Note (Signed)
Will likely need suture iridoplasty to round the pupil and prevent glare

## 2022-06-09 NOTE — Assessment & Plan Note (Signed)
Superonasal hemorrhagic cortical detachment in a patient who requires ongoing therapy with Eliquis.  At the time of surgical intervention likely that this low-lying hemorrhagic choroidal detachment will not expand however will be ready to perform cutdown sclerotomy if concerns for progression occur.  Superonasal quadrant.

## 2022-06-16 ENCOUNTER — Encounter (AMBULATORY_SURGERY_CENTER): Payer: Medicare Other | Admitting: Ophthalmology

## 2022-06-16 DIAGNOSIS — H59021 Cataract (lens) fragments in eye following cataract surgery, right eye: Secondary | ICD-10-CM | POA: Diagnosis not present

## 2022-06-16 DIAGNOSIS — H2189 Other specified disorders of iris and ciliary body: Secondary | ICD-10-CM | POA: Diagnosis not present

## 2022-06-16 DIAGNOSIS — T8522XA Displacement of intraocular lens, initial encounter: Secondary | ICD-10-CM

## 2022-06-17 ENCOUNTER — Encounter (INDEPENDENT_AMBULATORY_CARE_PROVIDER_SITE_OTHER): Payer: Medicare Other | Admitting: Ophthalmology

## 2022-06-17 ENCOUNTER — Encounter (INDEPENDENT_AMBULATORY_CARE_PROVIDER_SITE_OTHER): Payer: Self-pay | Admitting: Ophthalmology

## 2022-06-17 ENCOUNTER — Ambulatory Visit (INDEPENDENT_AMBULATORY_CARE_PROVIDER_SITE_OTHER): Payer: Medicare Other | Admitting: Ophthalmology

## 2022-06-17 DIAGNOSIS — H31411 Hemorrhagic choroidal detachment, right eye: Secondary | ICD-10-CM

## 2022-06-17 DIAGNOSIS — Q130 Coloboma of iris: Secondary | ICD-10-CM

## 2022-06-17 DIAGNOSIS — T8522XA Displacement of intraocular lens, initial encounter: Secondary | ICD-10-CM

## 2022-06-17 DIAGNOSIS — H353211 Exudative age-related macular degeneration, right eye, with active choroidal neovascularization: Secondary | ICD-10-CM

## 2022-06-17 MED ORDER — TIMOLOL MALEATE 0.5 % OP SOLN: 1.0000 [drp] | Freq: Two times a day (BID) | OPHTHALMIC | 1 refills | Status: DC

## 2022-06-17 NOTE — Progress Notes (Signed)
06/17/2022     CHIEF COMPLAINT Patient presents for  Chief Complaint  Patient presents with   Post-op Follow-up      HISTORY OF PRESENT ILLNESS: [name] is a 87 y.o. male who presents to the clinic today for:   HPI     Post-op Follow-up           Laterality: right eye   Discomfort: pain.  Negative for itching, foreign body sensation, tearing, discharge and floaters   Vision: is improved         Comments   1 day post op OD SX 06/16/22,  post vit, iol repositioning into sulcus and MCCANNEL suture repair of iris defect  Pt stated vision is still blurry but can notice some improvements.       Last edited by Hurman Horn, MD on 06/17/2022  8:47 AM.      Referring physician: Lisabeth Pick, MD [place],  [place] [address]  HISTORICAL INFORMATION:   Selected notes from the MEDICAL RECORD NUMBER    Lab Results  Component Value Date   HGBA1C 6.9 (H) 03/04/2022     CURRENT MEDICATIONS: Current Outpatient Medications (Ophthalmic Drugs)  Medication Sig   timolol (TIMOPTIC) 0.5 % ophthalmic solution Place 1 drop into the right eye 2 (two) times daily.   latanoprost (XALATAN) 0.005 % ophthalmic solution Place 1 drop into both eyes daily.   Propylene Glycol 0.6 % SOLN Place 1 drop into the left eye 2 (two) times daily as needed (for dry eyes).   No current facility-administered medications for this visit. (Ophthalmic Drugs)   Current Outpatient Medications (Other)  Medication Sig   Accu-Chek Softclix Lancets lancets Use to check blood sugar daily DX:E11.59   albuterol (VENTOLIN HFA) 108 (90 Base) MCG/ACT inhaler TAKE 2 PUFFS BY MOUTH EVERY 6 HOURS AS NEEDED FOR WHEEZE OR SHORTNESS OF BREATH   amiodarone (PACERONE) 200 MG tablet Take 1 tablet (200 mg total) by mouth daily. Except Saturday and Sunday.   apixaban (ELIQUIS) 5 MG TABS tablet TAKE 1 TABLET BY MOUTH TWICE A DAY   bisacodyl (DULCOLAX) 5 MG EC tablet Take 5 mg by mouth daily as  needed for moderate constipation.   Blood Glucose Monitoring Suppl (ACCU-CHEK AVIVA PLUS) w/Device KIT Use to check blood sugar daily DX:E11.59   cholecalciferol (VITAMIN D) 1000 units tablet Take 2,000 Units by mouth daily.   docusate sodium (COLACE) 100 MG capsule Take 200 mg by mouth at bedtime.   ferrous sulfate 325 (65 FE) MG tablet Take 1 tablet (325 mg total) by mouth 2 (two) times daily with a meal.   furosemide (LASIX) 40 MG tablet Take 2 tablets (80 mg) by mouth every other day alternating with 1 1/ tablet (60 mg) on the opposite days.   gabapentin (NEURONTIN) 100 MG capsule TAKE 1 CAPSULE (100 MG TOTAL) BY MOUTH THREE TIMES DAILY.   glucose blood (ACCU-CHEK AVIVA PLUS) test strip Use to check blood sugar daily DX:E11.59   metFORMIN (GLUCOPHAGE) 500 MG tablet TAKE 1 TABLET BY MOUTH THREE TIMES A DAY   metolazone (ZAROXOLYN) 2.5 MG tablet Take one tablet by mouth 30 minutes prior to taking your lasix, once every two weeks.   pantoprazole (PROTONIX) 40 MG tablet TAKE 1 TABLET BY MOUTH EVERY DAY   potassium chloride SA (KLOR-CON M) 20 MEQ tablet Take 1 tablet (20 mEq total) by mouth daily.   repaglinide (PRANDIN) 2 MG tablet TAKE 1 TABLET BY MOUTH 3  TIMES DAILY BEFORE MEALS.   simvastatin (ZOCOR) 10 MG tablet TAKE 1 TABLET BY MOUTH EVERY DAY   Tiotropium Bromide-Olodaterol (STIOLTO RESPIMAT) 2.5-2.5 MCG/ACT AERS Inhale 2 puffs into the lungs daily.   vitamin B-12 (CYANOCOBALAMIN) 250 MCG tablet Take 2 tablets (500 mcg total) by mouth daily.   No current facility-administered medications for this visit. (Other)      REVIEW OF SYSTEMS: ROS   Negative for: Constitutional, Gastrointestinal, Neurological, Skin, Genitourinary, Musculoskeletal, HENT, Endocrine, Cardiovascular, Eyes, Respiratory, Psychiatric, Allergic/Imm, Heme/Lymph Last edited by Silvestre Moment on 06/17/2022  8:04 AM.       ALLERGIES No Known Allergies  PAST MEDICAL HISTORY Past Medical History:  Diagnosis Date   Acute  diastolic CHF (congestive heart failure) ([facility]) 08/04/2020   Arthritis    Atrial fibrillation ([place])    post op; amiodarone and coumadin continued for 3 mos post op   CARCINOMA, SKIN, SQUAMOUS CELL 01/07/2010   Cataract    beginning of cataracts   COLONIC POLYPS, HX OF 04/26/2007   Coronary artery disease    s/p CABG 3/12: L-LAD, S-CFX (Dr. Roxy Manns); EF 45% at cath prior to CABG   DIABETES MELLITUS, TYPE II 04/26/2007   Hyperlipidemia    HYPERTENSION 04/26/2007   NEPHROLITHIASIS, HX OF 04/26/2007   Nuclear sclerotic cataract of right eye 10/02/2021   Surgery Dr. Lucianne Lei, 06-03-2022 with posterior capsule disruption posterior dislocation lens, disruption of pupil margin superiorly   SBO (small bowel obstruction) ([place]) 10/2015   Partial    Shortness of breath dyspnea    with exertion   Unspecified hearing loss 12/18/2008   Past Surgical History:  Procedure Laterality Date   BACK SURGERY     disectomy   CARDIOVERSION N/A 06/08/2018   Procedure: CARDIOVERSION;  Surgeon: Buford Dresser, MD;  Location: [facility];  Service: Cardiovascular;  Laterality: N/A;   CARDIOVERSION N/A 07/25/2018   Procedure: CARDIOVERSION;  Surgeon: Fay Records, MD;  Location: [facility];  Service: Cardiovascular;  Laterality: N/A;   COLONOSCOPY     CORONARY ARTERY BYPASS GRAFT  01/12/2011   LUMBAR LAMINECTOMY/DECOMPRESSION MICRODISCECTOMY N/A 05/08/2015   Procedure: LUMBAR LAMINECTOMY/DECOMPRESSION MICRODISCECTOMY 1 LEVEL Lumbar four five;  Surgeon: Melina Schools, MD;  Location: [facility];  Service: Orthopedics;  Laterality: N/A;   TOTAL HIP ARTHROPLASTY Left    left on 04/15/07   TYMPANIC MEMBRANE REPAIR  1981    FAMILY HISTORY Family History  Problem Relation Age of Onset   Heart failure Mother    Heart disease Father    Cancer Sister    Cancer Brother    Heart disease Brother    Breast cancer Sister    Colon cancer Neg Hx    Esophageal cancer Neg Hx    Rectal cancer Neg Hx    Stomach cancer Neg Hx      SOCIAL HISTORY Social History   Tobacco Use   Smoking status: Former    Packs/day: 2.00    Years: 30.00    Total pack years: 60.00    Types: Cigarettes    Quit date: 01/02/1981    Years since quitting: 41.4   Smokeless tobacco: Never  Vaping Use   Vaping Use: Never used  Substance Use Topics   Alcohol use: No   Drug use: No         OPHTHALMIC EXAM:  Base Eye Exam     Visual Acuity (ETDRS)       Right Left   Dist [place] CF at face 20/70 -2  Dist ph [place]  20/30 -2         Tonometry (Tonopen, 8:11 AM)       Right Left   Pressure 38 17         Pupils       Pupils APD   Right PERRL None   Left PERRL None         Visual Fields       Left Right    Full          Extraocular Movement       Right Left    Full Full         Neuro/Psych     Oriented x3: Yes         Dilation     Right eye: 2.5% Phenylephrine, 1.0% Mydriacyl @ 8:10 AM           Slit Lamp and Fundus Exam     External Exam       Right Left   External Normal Normal         Slit Lamp Exam       Right Left   Lids/Lashes Normal    Conjunctiva/Sclera 1+ Injection    Cornea wound secure, 1+ Edema    Anterior Chamber Deep, Trace Cell, no retained lens fragments remain anterior chamber    Iris Intact Prolene sutures superiorly at 12 margin of pupil, small keyhole small irregularity remains, pupil margin    Lens Well centered IOL, no remnants of cataract fragments remain    Anterior Vitreous Clear and avitric          Fundus Exam       Right Left   Posterior Vitreous Clear and avitric    Disc Normal    C/D Ratio 0.4    Macula Normal    Vessels Normal    Periphery Hemorrhagic choroidal detachment superonasal extending almost posteriorly to the nerve. Good view, no changes             IMAGING AND PROCEDURES  Imaging and Procedures for 06/17/22           ASSESSMENT/PLAN:  Choroidal detachment, hemorrhagic, right eye No change postop day #1  vitrectomy removal of retained lens fragments and repositioning of IOL into the sulcus  Exudative age-related macular degeneration of right eye with active choroidal neovascularization (HCC) No change, will schedule injection in the coming weeks     ICD-10-CM   1. Choroidal detachment, hemorrhagic, right eye  H31.411     2. Dislocated intraocular lens, initial encounter  T85.22XA     3. Exudative age-related macular degeneration of right eye with active choroidal neovascularization ([place])  H35.3211     4. Keyhole pupil of right eye  Q13.0       1.  2.  3.  Ophthalmic Meds Ordered this visit:  Meds ordered this encounter  Medications   timolol (TIMOPTIC) 0.5 % ophthalmic solution    Sig: Place 1 drop into the right eye 2 (two) times daily.    Dispense:  5 mL    Refill:  1       Return in about 5 days (around 06/22/2022) for dilate, OD, COLOR FP.  Patient Instructions  Patient using combo drop, likely ofloxacin, prednisolone phosphate, NSAID that he acquired via mail from Dr. Lorie Apley prescription  Use this drop 1 drop right eye 4 times daily  Use the remaining drops of Dr. Lucianne Lei as currently using one of them twice a day and  the other 1 is once a day   Today, I will add to the patient's medications timolol (yellow top) 1 drop right eye twice daily  Explained the diagnoses, plan, and follow up with the patient and they expressed understanding.  Patient expressed understanding of the importance of proper follow up care.   Clent Demark Caron Tardif M.D. Diseases & Surgery of the Retina and Vitreous Retina & Diabetic [place] 06/17/22     Abbreviations: M myopia (nearsighted); A astigmatism; H hyperopia (farsighted); P presbyopia; Mrx spectacle prescription;  CTL contact lenses; OD right eye; OS left eye; OU both eyes  XT exotropia; ET esotropia; PEK punctate epithelial keratitis; PEE punctate epithelial erosions; DES dry eye syndrome; MGD meibomian gland dysfunction; ATs  artificial tears; PFAT's preservative free artificial tears; [place] nuclear sclerotic cataract; PSC posterior subcapsular cataract; ERM epi-retinal membrane; PVD posterior vitreous detachment; RD retinal detachment; DM diabetes mellitus; DR diabetic retinopathy; NPDR non-proliferative diabetic retinopathy; PDR proliferative diabetic retinopathy; CSME clinically significant macular edema; DME diabetic macular edema; dbh dot blot hemorrhages; CWS cotton wool spot; POAG primary open angle glaucoma; C/D cup-to-disc ratio; HVF humphrey visual field; GVF goldmann visual field; OCT optical coherence tomography; IOP intraocular pressure; BRVO Branch retinal vein occlusion; CRVO central retinal vein occlusion; CRAO central retinal artery occlusion; BRAO branch retinal artery occlusion; RT retinal tear; SB scleral buckle; PPV pars plana vitrectomy; VH Vitreous hemorrhage; PRP panretinal laser photocoagulation; IVK intravitreal kenalog; VMT vitreomacular traction; MH Macular hole;  NVD neovascularization of the disc; NVE neovascularization elsewhere; AREDS age related eye disease study; ARMD age related macular degeneration; POAG primary open angle glaucoma; EBMD epithelial/anterior basement membrane dystrophy; ACIOL anterior chamber intraocular lens; IOL intraocular lens; PCIOL posterior chamber intraocular lens; Phaco/IOL phacoemulsification with intraocular lens placement; [place] photorefractive keratectomy; LASIK laser assisted in situ keratomileusis; HTN hypertension; DM diabetes mellitus; COPD chronic obstructive pulmonary disease

## 2022-06-17 NOTE — Patient Instructions (Signed)
Patient using combo drop, likely ofloxacin, prednisolone phosphate, NSAID that he acquired via mail from Dr. Lorie Apley prescription  Use this drop 1 drop right eye 4 times daily  Use the remaining drops of Dr. Lucianne Lei as currently using one of them twice a day and the other 1 is once a day   Today, I will add to the patient's medications timolol (yellow top) 1 drop right eye twice daily

## 2022-06-17 NOTE — Assessment & Plan Note (Signed)
No change, will schedule injection in the coming weeks

## 2022-06-17 NOTE — Assessment & Plan Note (Signed)
No change postop day #1 vitrectomy removal of retained lens fragments and repositioning of IOL into the sulcus

## 2022-06-18 ENCOUNTER — Other Ambulatory Visit (INDEPENDENT_AMBULATORY_CARE_PROVIDER_SITE_OTHER): Payer: Self-pay | Admitting: Ophthalmology

## 2022-06-18 NOTE — Progress Notes (Unsigned)
timolo

## 2022-06-23 ENCOUNTER — Encounter (INDEPENDENT_AMBULATORY_CARE_PROVIDER_SITE_OTHER): Payer: Self-pay | Admitting: Ophthalmology

## 2022-06-23 ENCOUNTER — Ambulatory Visit (INDEPENDENT_AMBULATORY_CARE_PROVIDER_SITE_OTHER): Payer: Medicare Other | Admitting: Ophthalmology

## 2022-06-23 DIAGNOSIS — Q130 Coloboma of iris: Secondary | ICD-10-CM

## 2022-06-23 DIAGNOSIS — H31411 Hemorrhagic choroidal detachment, right eye: Secondary | ICD-10-CM | POA: Diagnosis not present

## 2022-06-23 DIAGNOSIS — H353132 Nonexudative age-related macular degeneration, bilateral, intermediate dry stage: Secondary | ICD-10-CM

## 2022-06-23 DIAGNOSIS — H353211 Exudative age-related macular degeneration, right eye, with active choroidal neovascularization: Secondary | ICD-10-CM | POA: Diagnosis not present

## 2022-06-23 DIAGNOSIS — T8522XA Displacement of intraocular lens, initial encounter: Secondary | ICD-10-CM

## 2022-06-23 NOTE — Assessment & Plan Note (Signed)
Condition improved now resolved post repositioning of intraocular lens into the sulcus, in combination with suture repair of iris defect superiorly

## 2022-06-23 NOTE — Assessment & Plan Note (Signed)
No sign of CNVM by her fundus photography

## 2022-06-23 NOTE — Assessment & Plan Note (Signed)
Disciform scar largely inactive yet when injection today we will delay 1 week.  And then thereafter monitor

## 2022-06-23 NOTE — Assessment & Plan Note (Addendum)
Improved post surgical repair,Mc Cannell suture superiorly

## 2022-06-23 NOTE — Assessment & Plan Note (Signed)
Stable in size no progression

## 2022-06-23 NOTE — Progress Notes (Signed)
06/23/2022     CHIEF COMPLAINT Patient presents for  Chief Complaint  Patient presents with   Macular Degeneration   Post-op Follow-up      HISTORY OF PRESENT ILLNESS: [name] is a 87 y.o. male who presents to the clinic today for:   HPI     Post-op Follow-up           Laterality: right eye   Vision: is improved         Comments   History of wet AMD in disciform scar centrally OD some 5 days post vitrectomy, reposition intraocular ends, suture repair via mechanical suture of iris defect OD.  Movable of retained lens fragments.  Visual acuity is better and "now back to what it was previous surgery.  "  History of wet AMD OD, scheduled for injection today.  We will delay this 1 week Pt states his vision has been stable Pt states his vision is about the same before he has surgery Pt denies any new floaters or FOL       Last edited by Hurman Horn, MD on 06/23/2022  9:45 AM.      Referring physician: Cassandria Anger, MD [place],  [place] [id number]  HISTORICAL INFORMATION:   Selected notes from the MEDICAL RECORD NUMBER    Lab Results  Component Value Date   HGBA1C 6.9 (H) 03/04/2022     CURRENT MEDICATIONS: Current Outpatient Medications (Ophthalmic Drugs)  Medication Sig   latanoprost (XALATAN) 0.005 % ophthalmic solution Place 1 drop into both eyes daily.   Propylene Glycol 0.6 % SOLN Place 1 drop into the left eye 2 (two) times daily as needed (for dry eyes).   timolol (TIMOPTIC) 0.5 % ophthalmic solution Place 1 drop into the right eye 2 (two) times daily.   No current facility-administered medications for this visit. (Ophthalmic Drugs)   Current Outpatient Medications (Other)  Medication Sig   Accu-Chek Softclix Lancets lancets Use to check blood sugar daily DX:E11.59   albuterol (VENTOLIN HFA) 108 (90 Base) MCG/ACT inhaler TAKE 2 PUFFS BY MOUTH EVERY 6 HOURS AS NEEDED FOR WHEEZE OR SHORTNESS OF BREATH   amiodarone  (PACERONE) 200 MG tablet Take 1 tablet (200 mg total) by mouth daily. Except Saturday and Sunday.   apixaban (ELIQUIS) 5 MG TABS tablet TAKE 1 TABLET BY MOUTH TWICE A DAY   bisacodyl (DULCOLAX) 5 MG EC tablet Take 5 mg by mouth daily as needed for moderate constipation.   Blood Glucose Monitoring Suppl (ACCU-CHEK AVIVA PLUS) w/Device KIT Use to check blood sugar daily DX:E11.59   cholecalciferol (VITAMIN D) 1000 units tablet Take 2,000 Units by mouth daily.   docusate sodium (COLACE) 100 MG capsule Take 200 mg by mouth at bedtime.   ferrous sulfate 325 (65 FE) MG tablet Take 1 tablet (325 mg total) by mouth 2 (two) times daily with a meal.   furosemide (LASIX) 40 MG tablet Take 2 tablets (80 mg) by mouth every other day alternating with 1 1/ tablet (60 mg) on the opposite days.   gabapentin (NEURONTIN) 100 MG capsule TAKE 1 CAPSULE (100 MG TOTAL) BY MOUTH THREE TIMES DAILY.   glucose blood (ACCU-CHEK AVIVA PLUS) test strip Use to check blood sugar daily DX:E11.59   metFORMIN (GLUCOPHAGE) 500 MG tablet TAKE 1 TABLET BY MOUTH THREE TIMES A DAY   metolazone (ZAROXOLYN) 2.5 MG tablet Take one tablet by mouth 30 minutes prior to taking your lasix, once every  two weeks.   pantoprazole (PROTONIX) 40 MG tablet TAKE 1 TABLET BY MOUTH EVERY DAY   potassium chloride SA (KLOR-CON M) 20 MEQ tablet Take 1 tablet (20 mEq total) by mouth daily.   repaglinide (PRANDIN) 2 MG tablet TAKE 1 TABLET BY MOUTH 3 TIMES DAILY BEFORE MEALS.   simvastatin (ZOCOR) 10 MG tablet TAKE 1 TABLET BY MOUTH EVERY DAY   Tiotropium Bromide-Olodaterol (STIOLTO RESPIMAT) 2.5-2.5 MCG/ACT AERS Inhale 2 puffs into the lungs daily.   vitamin B-12 (CYANOCOBALAMIN) 250 MCG tablet Take 2 tablets (500 mcg total) by mouth daily.   No current facility-administered medications for this visit. (Other)      REVIEW OF SYSTEMS: ROS   Negative for: Constitutional, Gastrointestinal, Neurological, Skin, Genitourinary, Musculoskeletal, HENT,  Endocrine, Cardiovascular, Eyes, Respiratory, Psychiatric, Allergic/Imm, Heme/Lymph Last edited by Orene Desanctis D, CMA on 06/23/2022  8:53 AM.       ALLERGIES No Known Allergies  PAST MEDICAL HISTORY Past Medical History:  Diagnosis Date   Acute diastolic CHF (congestive heart failure) ([place]) 08/04/2020   Arthritis    Atrial fibrillation ([place])    post op; amiodarone and coumadin continued for 3 mos post op   CARCINOMA, SKIN, SQUAMOUS CELL 01/07/2010   Cataract    beginning of cataracts   COLONIC POLYPS, HX OF 04/26/2007   Coronary artery disease    s/p CABG 3/12: L-LAD, S-CFX (Dr. Roxy Manns); EF 45% at cath prior to CABG   DIABETES MELLITUS, TYPE II 04/26/2007   Hyperlipidemia    HYPERTENSION 04/26/2007   NEPHROLITHIASIS, HX OF 04/26/2007   Nuclear sclerotic cataract of right eye 10/02/2021   Surgery Dr. Lucianne Lei, 06-03-2022 with posterior capsule disruption posterior dislocation lens, disruption of pupil margin superiorly   SBO (small bowel obstruction) ([place]) 10/2015   Partial    Shortness of breath dyspnea    with exertion   Unspecified hearing loss 12/18/2008   Past Surgical History:  Procedure Laterality Date   BACK SURGERY     disectomy   CARDIOVERSION N/A 06/08/2018   Procedure: CARDIOVERSION;  Surgeon: Buford Dresser, MD;  Location: [facility];  Service: Cardiovascular;  Laterality: N/A;   CARDIOVERSION N/A 07/25/2018   Procedure: CARDIOVERSION;  Surgeon: Fay Records, MD;  Location: [facility];  Service: Cardiovascular;  Laterality: N/A;   COLONOSCOPY     CORONARY ARTERY BYPASS GRAFT  01/12/2011   LUMBAR LAMINECTOMY/DECOMPRESSION MICRODISCECTOMY N/A 05/08/2015   Procedure: LUMBAR LAMINECTOMY/DECOMPRESSION MICRODISCECTOMY 1 LEVEL Lumbar four five;  Surgeon: Melina Schools, MD;  Location: [facility];  Service: Orthopedics;  Laterality: N/A;   TOTAL HIP ARTHROPLASTY Left    left on 04/15/07   TYMPANIC MEMBRANE REPAIR  1981    FAMILY HISTORY Family History  Problem Relation Age  of Onset   Heart failure Mother    Heart disease Father    Cancer Sister    Cancer Brother    Heart disease Brother    Breast cancer Sister    Colon cancer Neg Hx    Esophageal cancer Neg Hx    Rectal cancer Neg Hx    Stomach cancer Neg Hx     SOCIAL HISTORY Social History   Tobacco Use   Smoking status: Former    Packs/day: 2.00    Years: 30.00    Total pack years: 60.00    Types: Cigarettes    Quit date: 01/02/1981    Years since quitting: 41.4   Smokeless tobacco: Never  Vaping Use   Vaping Use: Never used  Substance Use  Topics   Alcohol use: No   Drug use: No         OPHTHALMIC EXAM:  Base Eye Exam     Visual Acuity (Snellen - Linear)       Right Left   Dist cc CF at 3' 20/25 +3         Tonometry (Tonopen, 8:58 AM)       Right Left   Pressure 19 21         Pupils       Pupils Shape   Right PERRL Round   Left PERRL          Neuro/Psych     Oriented x3: Yes         Dilation     Right eye: 2.5% Phenylephrine, 1.0% Mydriacyl @ 8:56 AM           Slit Lamp and Fundus Exam     External Exam       Right Left   External Normal Normal         Slit Lamp Exam       Right Left   Lids/Lashes Normal    Conjunctiva/Sclera 1+ Injection    Cornea wound secure,     Anterior Chamber Deep, Trace Cell, no retained lens fragments remain anterior chamber    Iris Intact Prolene sutures superiorly at 12 margin of pupil, small keyhole small irregularity remains, pupil margin improved contour    Lens Well centered IOL, small cortical remnant in peripheral capsule inf.    Anterior Vitreous Clear and avitric          Fundus Exam       Right Left   Posterior Vitreous Clear and avitric    Disc Normal    C/D Ratio 0.4    Macula Disciform scar 2.5 da size    Vessels Normal    Periphery Hemorrhagic choroidal detachment superonasal extending almost posteriorly to the nerve. Good view, no changes             IMAGING AND PROCEDURES   Imaging and Procedures for 06/23/22  Color Fundus Photography Optos - OU - Both Eyes       Right Eye Progression has no prior data. Disc findings include normal observations. Macula : drusen, retinal pigment epithelium abnormalities. Vessels : normal observations. Periphery : normal observations.   Left Eye Progression has no prior data. Disc findings include normal observations. Macula : drusen, epiretinal membrane. Vessels : normal observations. Periphery : normal observations.   Notes OD with a 2.25 disc area size subfoveal disciform scar accounting for acuity with associated atrophy. This region is now well pigmented circumferentially to this lesion.  This does signifying likely that it is undergoing regression, thus we will treat again today to maintain and prevent scotoma enlargement and thereafter extend interval examination             ASSESSMENT/PLAN:  Choroidal detachment, hemorrhagic, right eye Stable in size no progression  Dislocated intraocular lens, initial encounter Condition improved now resolved post repositioning of intraocular lens into the sulcus, in combination with suture repair of iris defect superiorly  Exudative age-related macular degeneration of right eye with active choroidal neovascularization ([place]) Disciform scar largely inactive yet when injection today we will delay 1 week.  And then thereafter monitor  Intermediate stage nonexudative age-related macular degeneration of both eyes No sign of CNVM by her fundus photography  Keyhole pupil of right eye Improved post surgical repair,Mc Cannell suture superiorly  ICD-10-CM   1. Choroidal detachment, hemorrhagic, right eye  H31.411 Color Fundus Photography Optos - OU - Both Eyes    2. Dislocated intraocular lens, initial encounter  T85.22XA     3. Exudative age-related macular degeneration of right eye with active choroidal neovascularization ([place])  H35.3211     4. Intermediate stage  nonexudative age-related macular degeneration of both eyes  H35.3132     5. Keyhole pupil of right eye  Q13.0       1.  Continue on topical medications right eye  2.  Follow-up 1 week likely injection Avastin OD next visit  3.  Ophthalmic Meds Ordered this visit:  No orders of the defined types were placed in this encounter.      Return in about 8 days (around 07/01/2022) for dilate, OD, AVASTIN OCT.  There are no Patient Instructions on file for this visit.   Explained the diagnoses, plan, and follow up with the patient and they expressed understanding.  Patient expressed understanding of the importance of proper follow up care.   Clent Demark Darien Kading M.D. Diseases & Surgery of the Retina and Vitreous Retina & Diabetic [place] 06/23/22     Abbreviations: M myopia (nearsighted); A astigmatism; H hyperopia (farsighted); P presbyopia; Mrx spectacle prescription;  CTL contact lenses; OD right eye; OS left eye; OU both eyes  XT exotropia; ET esotropia; PEK punctate epithelial keratitis; PEE punctate epithelial erosions; DES dry eye syndrome; MGD meibomian gland dysfunction; ATs artificial tears; PFAT's preservative free artificial tears; [place] nuclear sclerotic cataract; PSC posterior subcapsular cataract; ERM epi-retinal membrane; PVD posterior vitreous detachment; RD retinal detachment; DM diabetes mellitus; DR diabetic retinopathy; NPDR non-proliferative diabetic retinopathy; PDR proliferative diabetic retinopathy; CSME clinically significant macular edema; DME diabetic macular edema; dbh dot blot hemorrhages; CWS cotton wool spot; POAG primary open angle glaucoma; C/D cup-to-disc ratio; HVF humphrey visual field; GVF goldmann visual field; OCT optical coherence tomography; IOP intraocular pressure; BRVO Branch retinal vein occlusion; CRVO central retinal vein occlusion; CRAO central retinal artery occlusion; BRAO branch retinal artery occlusion; RT retinal tear; SB scleral buckle; PPV  pars plana vitrectomy; VH Vitreous hemorrhage; PRP panretinal laser photocoagulation; IVK intravitreal kenalog; VMT vitreomacular traction; MH Macular hole;  NVD neovascularization of the disc; NVE neovascularization elsewhere; AREDS age related eye disease study; ARMD age related macular degeneration; POAG primary open angle glaucoma; EBMD epithelial/anterior basement membrane dystrophy; ACIOL anterior chamber intraocular lens; IOL intraocular lens; PCIOL posterior chamber intraocular lens; Phaco/IOL phacoemulsification with intraocular lens placement; [place] photorefractive keratectomy; LASIK laser assisted in situ keratomileusis; HTN hypertension; DM diabetes mellitus; COPD chronic obstructive pulmonary disease

## 2022-07-01 ENCOUNTER — Encounter (INDEPENDENT_AMBULATORY_CARE_PROVIDER_SITE_OTHER): Payer: Self-pay | Admitting: Ophthalmology

## 2022-07-01 ENCOUNTER — Ambulatory Visit (INDEPENDENT_AMBULATORY_CARE_PROVIDER_SITE_OTHER): Payer: Medicare Other | Admitting: Ophthalmology

## 2022-07-01 ENCOUNTER — Encounter (INDEPENDENT_AMBULATORY_CARE_PROVIDER_SITE_OTHER): Payer: Medicare Other | Admitting: Ophthalmology

## 2022-07-01 DIAGNOSIS — T8522XA Displacement of intraocular lens, initial encounter: Secondary | ICD-10-CM

## 2022-07-01 DIAGNOSIS — Q130 Coloboma of iris: Secondary | ICD-10-CM

## 2022-07-01 DIAGNOSIS — H353211 Exudative age-related macular degeneration, right eye, with active choroidal neovascularization: Secondary | ICD-10-CM | POA: Diagnosis not present

## 2022-07-01 DIAGNOSIS — H31411 Hemorrhagic choroidal detachment, right eye: Secondary | ICD-10-CM

## 2022-07-01 NOTE — Assessment & Plan Note (Signed)
Subfoveal, not active today we will continue to observe no need for injection today injection canceled

## 2022-07-01 NOTE — Assessment & Plan Note (Signed)
Shallow detachment is persistent but not enlarging.  We will continue to monitor and observe

## 2022-07-01 NOTE — Progress Notes (Signed)
07/01/2022     CHIEF COMPLAINT Patient presents for  Chief Complaint  Patient presents with   Post-op Follow-up      HISTORY OF PRESENT ILLNESS: [name] is a 87 y.o. male who presents to the clinic today for:   HPI      1week dilate od avastin oct Pt states his vision has been stable  Pt denies any new floaters but admits to seeing FOL 10 days ago Last edited by Hurman Horn, MD on 07/01/2022  8:52 AM.      Referring physician: Cassandria Anger, MD [place],  [place] [address]  HISTORICAL INFORMATION:   Selected notes from the MEDICAL RECORD NUMBER    Lab Results  Component Value Date   HGBA1C 6.9 (H) 03/04/2022     CURRENT MEDICATIONS: Current Outpatient Medications (Ophthalmic Drugs)  Medication Sig   latanoprost (XALATAN) 0.005 % ophthalmic solution Place 1 drop into both eyes daily.   Propylene Glycol 0.6 % SOLN Place 1 drop into the left eye 2 (two) times daily as needed (for dry eyes).   No current facility-administered medications for this visit. (Ophthalmic Drugs)   Current Outpatient Medications (Other)  Medication Sig   Accu-Chek Softclix Lancets lancets Use to check blood sugar daily DX:E11.59   albuterol (VENTOLIN HFA) 108 (90 Base) MCG/ACT inhaler TAKE 2 PUFFS BY MOUTH EVERY 6 HOURS AS NEEDED FOR WHEEZE OR SHORTNESS OF BREATH   amiodarone (PACERONE) 200 MG tablet Take 1 tablet (200 mg total) by mouth daily. Except Saturday and Sunday.   apixaban (ELIQUIS) 5 MG TABS tablet TAKE 1 TABLET BY MOUTH TWICE A DAY   bisacodyl (DULCOLAX) 5 MG EC tablet Take 5 mg by mouth daily as needed for moderate constipation.   Blood Glucose Monitoring Suppl (ACCU-CHEK AVIVA PLUS) w/Device KIT Use to check blood sugar daily DX:E11.59   cholecalciferol (VITAMIN D) 1000 units tablet Take 2,000 Units by mouth daily.   docusate sodium (COLACE) 100 MG capsule Take 200 mg by mouth at bedtime.   ferrous sulfate 325 (65 FE) MG tablet Take 1  tablet (325 mg total) by mouth 2 (two) times daily with a meal.   furosemide (LASIX) 40 MG tablet Take 2 tablets (80 mg) by mouth every other day alternating with 1 1/ tablet (60 mg) on the opposite days.   gabapentin (NEURONTIN) 100 MG capsule TAKE 1 CAPSULE (100 MG TOTAL) BY MOUTH THREE TIMES DAILY.   glucose blood (ACCU-CHEK AVIVA PLUS) test strip Use to check blood sugar daily DX:E11.59   metFORMIN (GLUCOPHAGE) 500 MG tablet TAKE 1 TABLET BY MOUTH THREE TIMES A DAY   metolazone (ZAROXOLYN) 2.5 MG tablet Take one tablet by mouth 30 minutes prior to taking your lasix, once every two weeks.   pantoprazole (PROTONIX) 40 MG tablet TAKE 1 TABLET BY MOUTH EVERY DAY   potassium chloride SA (KLOR-CON M) 20 MEQ tablet Take 1 tablet (20 mEq total) by mouth daily.   repaglinide (PRANDIN) 2 MG tablet TAKE 1 TABLET BY MOUTH 3 TIMES DAILY BEFORE MEALS.   simvastatin (ZOCOR) 10 MG tablet TAKE 1 TABLET BY MOUTH EVERY DAY   Tiotropium Bromide-Olodaterol (STIOLTO RESPIMAT) 2.5-2.5 MCG/ACT AERS Inhale 2 puffs into the lungs daily.   vitamin B-12 (CYANOCOBALAMIN) 250 MCG tablet Take 2 tablets (500 mcg total) by mouth daily.   No current facility-administered medications for this visit. (Other)      REVIEW OF SYSTEMS: ROS   Negative for: Constitutional, Gastrointestinal,  Neurological, Skin, Genitourinary, Musculoskeletal, HENT, Endocrine, Cardiovascular, Eyes, Respiratory, Psychiatric, Allergic/Imm, Heme/Lymph Last edited by Orene Desanctis D, CMA on 07/01/2022  8:23 AM.       ALLERGIES No Known Allergies  PAST MEDICAL HISTORY Past Medical History:  Diagnosis Date   Acute diastolic CHF (congestive heart failure) ([place]) 08/04/2020   Arthritis    Atrial fibrillation ([place])    post op; amiodarone and coumadin continued for 3 mos post op   CARCINOMA, SKIN, SQUAMOUS CELL 01/07/2010   Cataract    beginning of cataracts   COLONIC POLYPS, HX OF 04/26/2007   Coronary artery disease    s/p CABG 3/12: L-LAD,  S-CFX (Dr. Roxy Manns); EF 45% at cath prior to CABG   DIABETES MELLITUS, TYPE II 04/26/2007   Hyperlipidemia    HYPERTENSION 04/26/2007   NEPHROLITHIASIS, HX OF 04/26/2007   Nuclear sclerotic cataract of right eye 10/02/2021   Surgery Dr. Lucianne Lei, 06-03-2022 with posterior capsule disruption posterior dislocation lens, disruption of pupil margin superiorly   SBO (small bowel obstruction) ([place]) 10/2015   Partial    Shortness of breath dyspnea    with exertion   Unspecified hearing loss 12/18/2008   Past Surgical History:  Procedure Laterality Date   BACK SURGERY     disectomy   CARDIOVERSION N/A 06/08/2018   Procedure: CARDIOVERSION;  Surgeon: Buford Dresser, MD;  Location: [facility];  Service: Cardiovascular;  Laterality: N/A;   CARDIOVERSION N/A 07/25/2018   Procedure: CARDIOVERSION;  Surgeon: Fay Records, MD;  Location: [facility];  Service: Cardiovascular;  Laterality: N/A;   COLONOSCOPY     CORONARY ARTERY BYPASS GRAFT  01/12/2011   LUMBAR LAMINECTOMY/DECOMPRESSION MICRODISCECTOMY N/A 05/08/2015   Procedure: LUMBAR LAMINECTOMY/DECOMPRESSION MICRODISCECTOMY 1 LEVEL Lumbar four five;  Surgeon: Melina Schools, MD;  Location: [facility];  Service: Orthopedics;  Laterality: N/A;   TOTAL HIP ARTHROPLASTY Left    left on 04/15/07   TYMPANIC MEMBRANE REPAIR  1981    FAMILY HISTORY Family History  Problem Relation Age of Onset   Heart failure Mother    Heart disease Father    Cancer Sister    Cancer Brother    Heart disease Brother    Breast cancer Sister    Colon cancer Neg Hx    Esophageal cancer Neg Hx    Rectal cancer Neg Hx    Stomach cancer Neg Hx     SOCIAL HISTORY Social History   Tobacco Use   Smoking status: Former    Packs/day: 2.00    Years: 30.00    Total pack years: 60.00    Types: Cigarettes    Quit date: 01/02/1981    Years since quitting: 41.5   Smokeless tobacco: Never  Vaping Use   Vaping Use: Never used  Substance Use Topics   Alcohol use: No   Drug use:  No         OPHTHALMIC EXAM:  Base Eye Exam     Visual Acuity (Snellen - Linear)       Right Left   Dist [place] CF at 3' 20/20         Tonometry (Tonopen, 8:27 AM)       Right Left   Pressure 20 22         Neuro/Psych     Oriented x3: Yes         Dilation     Right eye: 2.5% Phenylephrine, 1.0% Mydriacyl @ 8:25 AM           Slit  Lamp and Fundus Exam     External Exam       Right Left   External Normal Normal         Slit Lamp Exam       Right Left   Lids/Lashes Normal    Conjunctiva/Sclera 1+ Injection    Cornea wound secure,     Anterior Chamber Deep, Trace Cell, no retained lens fragments remain anterior chamber    Iris Intact Prolene sutures superiorly at 12 margin of pupil, small keyhole small irregularity remains, pupil margin improved contour    Lens Well centered IOL, small cortical remnant in peripheral capsule inf.    Anterior Vitreous Clear and avitric          Fundus Exam       Right Left   Posterior Vitreous Clear and avitric    Disc Normal    C/D Ratio 0.4    Macula Disciform scar 2.5 da size no active disease or fluid    Vessels Normal    Periphery Hemorrhagic choroidal detachment superonasal extending almost posteriorly to the nerve.  Shallow retinal detachment also inferonasal.  Good view, no changes             IMAGING AND PROCEDURES  Imaging and Procedures for 07/01/22  OCT, Retina - OU - Both Eyes       Right Eye Quality was good. Scan locations included subfoveal. Central Foveal Thickness: 306. Progression has improved. Findings include abnormal foveal contour, pigment epithelial detachment, subretinal fluid.   Left Eye Quality was good. Scan locations included subfoveal. Central Foveal Thickness: 289. Progression has been stable. Findings include abnormal foveal contour, retinal drusen , epiretinal membrane.   Notes Significant hemorrhagic PED OD.  Subfoveal location.  Wet AMD OD.  Overall much smaller  pigment epithelial detachment since onset of therapy June 2022..  With less thickening, we will repeat injection today, and improved overall since onset of therapy June 2022, no residual subretinal fluid currently.  Vision limited by subretinal fibrosis residual fluid around the distal active subfoveal disciform scar in this view we will not treat with antivegF today  OS with minor epiretinal membrane.  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  We will continue to monitor and observe  Dislocated intraocular lens, initial encounter IOL well centered.  Keyhole pupil of right eye Improved status post McCannel suture fixation     ICD-10-CM   1. Choroidal detachment, hemorrhagic, right eye  H31.411 OCT, Retina - OU - Both Eyes    2. Exudative age-related macular degeneration of right eye with active choroidal neovascularization ([facility])  H35.3211     3. Dislocated intraocular lens, initial encounter  T85.22XA     4. Keyhole pupil of right eye  Q13.0       1.  OD choroidal hemorrhage remains stable  2.  OD exudative macular degeneration not active, will not treat with antivegF today  3.  Get intraocular lens, 2 weeks post repair via vitrectomy reposition IOL in suture fixation to iris, it looks great with return of peripheral vision  Ophthalmic Meds Ordered this visit:  No orders of the defined types were placed in this encounter.      Return in about 3 weeks (around 07/22/2022) for dilate, OD, POST OP.  There are no Patient  Instructions on file for this visit.   Explained the diagnoses, plan, and follow up with the patient and they expressed understanding.  Patient expressed understanding of the importance of proper follow up care.   Clent Demark Jacey Eckerson M.D. Diseases & Surgery of the Retina and Vitreous Retina & Diabetic [place] 07/01/22     Abbreviations: M myopia (nearsighted); A astigmatism; H hyperopia (farsighted); P presbyopia; Mrx spectacle prescription;  CTL contact lenses; OD right eye; OS left eye; OU both eyes  XT exotropia; ET esotropia; PEK punctate epithelial keratitis; PEE punctate epithelial erosions; DES dry eye syndrome; MGD meibomian gland dysfunction; ATs artificial tears; PFAT's preservative free artificial tears; [place] nuclear sclerotic cataract; PSC posterior subcapsular cataract; ERM epi-retinal membrane; PVD posterior vitreous detachment; RD retinal detachment; DM diabetes mellitus; DR diabetic retinopathy; NPDR non-proliferative diabetic retinopathy; PDR proliferative diabetic retinopathy; CSME clinically significant macular edema; DME diabetic macular edema; dbh dot blot hemorrhages; CWS cotton wool spot; POAG primary open angle glaucoma; C/D cup-to-disc ratio; HVF humphrey visual field; GVF goldmann visual field; OCT optical coherence tomography; IOP intraocular pressure; BRVO Branch retinal vein occlusion; CRVO central retinal vein occlusion; CRAO central retinal artery occlusion; BRAO branch retinal artery occlusion; RT retinal tear; SB scleral buckle; PPV pars plana vitrectomy; VH Vitreous hemorrhage; PRP panretinal laser photocoagulation; IVK intravitreal kenalog; VMT vitreomacular traction; MH Macular hole;  NVD neovascularization of the disc; NVE neovascularization elsewhere; AREDS age related eye disease study; ARMD age related macular degeneration; POAG primary open angle glaucoma; EBMD epithelial/anterior basement membrane dystrophy; ACIOL anterior chamber intraocular lens; IOL intraocular  lens; PCIOL posterior chamber intraocular lens; Phaco/IOL phacoemulsification with intraocular lens placement; [place] photorefractive keratectomy; LASIK laser assisted in situ keratomileusis; HTN hypertension; DM diabetes mellitus; COPD chronic obstructive pulmonary disease

## 2022-07-01 NOTE — Assessment & Plan Note (Signed)
Improved status post McCannel suture fixation

## 2022-07-01 NOTE — Assessment & Plan Note (Signed)
IOL well centered.

## 2022-07-03 DIAGNOSIS — H35372 Puckering of macula, left eye: Secondary | ICD-10-CM | POA: Diagnosis not present

## 2022-07-03 DIAGNOSIS — H353122 Nonexudative age-related macular degeneration, left eye, intermediate dry stage: Secondary | ICD-10-CM | POA: Diagnosis not present

## 2022-07-03 DIAGNOSIS — H353211 Exudative age-related macular degeneration, right eye, with active choroidal neovascularization: Secondary | ICD-10-CM | POA: Diagnosis not present

## 2022-07-03 DIAGNOSIS — H33011 Retinal detachment with single break, right eye: Secondary | ICD-10-CM | POA: Diagnosis not present

## 2022-07-06 DIAGNOSIS — H33011 Retinal detachment with single break, right eye: Secondary | ICD-10-CM | POA: Diagnosis not present

## 2022-07-06 DIAGNOSIS — H353211 Exudative age-related macular degeneration, right eye, with active choroidal neovascularization: Secondary | ICD-10-CM | POA: Diagnosis not present

## 2022-07-06 DIAGNOSIS — H33021 Retinal detachment with multiple breaks, right eye: Secondary | ICD-10-CM | POA: Diagnosis not present

## 2022-07-07 DIAGNOSIS — H59811 Chorioretinal scars after surgery for detachment, right eye: Secondary | ICD-10-CM | POA: Diagnosis not present

## 2022-07-07 DIAGNOSIS — H33021 Retinal detachment with multiple breaks, right eye: Secondary | ICD-10-CM | POA: Diagnosis not present

## 2022-07-15 DIAGNOSIS — S61215A Laceration without foreign body of left ring finger without damage to nail, initial encounter: Secondary | ICD-10-CM | POA: Diagnosis not present

## 2022-07-20 DIAGNOSIS — Z5189 Encounter for other specified aftercare: Secondary | ICD-10-CM | POA: Diagnosis not present

## 2022-07-20 DIAGNOSIS — L03114 Cellulitis of left upper limb: Secondary | ICD-10-CM | POA: Diagnosis not present

## 2022-07-22 ENCOUNTER — Ambulatory Visit (INDEPENDENT_AMBULATORY_CARE_PROVIDER_SITE_OTHER): Payer: Medicare Other | Admitting: Ophthalmology

## 2022-07-22 DIAGNOSIS — H31411 Hemorrhagic choroidal detachment, right eye: Secondary | ICD-10-CM

## 2022-07-24 DIAGNOSIS — S61215D Laceration without foreign body of left ring finger without damage to nail, subsequent encounter: Secondary | ICD-10-CM | POA: Diagnosis not present

## 2022-07-24 DIAGNOSIS — Z4802 Encounter for removal of sutures: Secondary | ICD-10-CM | POA: Diagnosis not present

## 2022-07-24 DIAGNOSIS — L03114 Cellulitis of left upper limb: Secondary | ICD-10-CM | POA: Diagnosis not present

## 2022-07-24 DIAGNOSIS — S61213D Laceration without foreign body of left middle finger without damage to nail, subsequent encounter: Secondary | ICD-10-CM | POA: Diagnosis not present

## 2022-07-27 ENCOUNTER — Other Ambulatory Visit: Payer: Self-pay | Admitting: Internal Medicine

## 2022-08-10 ENCOUNTER — Encounter (INDEPENDENT_AMBULATORY_CARE_PROVIDER_SITE_OTHER): Payer: Self-pay | Admitting: Ophthalmology

## 2022-08-10 NOTE — Progress Notes (Signed)
07/22/2022     CHIEF COMPLAINT Patient presents for  Chief Complaint  Patient presents with   Post-op Follow-up    Patient presented for appointment but I canceled appointment upon his arrival as patient is in the postop care for retinal detachment repair of the right eye under Dr.Nathan Yolanda Bonine.  HISTORY OF PRESENT ILLNESS: [name] is a 87 y.o. male who presents to the clinic today for:   HPI     Post-op Follow-up           Laterality: right eye   Discomfort: Negative for pain, itching, foreign body sensation, tearing, discharge, floaters and none   Vision: is worse         Comments   Pt's appoint was canceled per Dr. Radene Ou request. Pt was advised to follow up with Dr. Clovia Cuff with POST OP SX done in OD.       Last edited by Silvestre Moment on 07/22/2022  9:17 AM.      Referring physician: Cassandria Anger, MD [place],  [place] [address]  HISTORICAL INFORMATION:   Selected notes from the MEDICAL RECORD NUMBER    Lab Results  Component Value Date   HGBA1C 6.9 (H) 03/04/2022     CURRENT MEDICATIONS: Current Outpatient Medications (Ophthalmic Drugs)  Medication Sig   latanoprost (XALATAN) 0.005 % ophthalmic solution Place 1 drop into both eyes daily.   Propylene Glycol 0.6 % SOLN Place 1 drop into the left eye 2 (two) times daily as needed (for dry eyes).   No current facility-administered medications for this visit. (Ophthalmic Drugs)   Current Outpatient Medications (Other)  Medication Sig   Accu-Chek Softclix Lancets lancets Use to check blood sugar daily DX:E11.59   albuterol (VENTOLIN HFA) 108 (90 Base) MCG/ACT inhaler TAKE 2 PUFFS BY MOUTH EVERY 6 HOURS AS NEEDED FOR WHEEZE OR SHORTNESS OF BREATH   amiodarone (PACERONE) 200 MG tablet TAKE 1 TABLET (200 MG TOTAL) BY MOUTH DAILY. EXCEPT SATURDAY AND SUNDAY.   apixaban (ELIQUIS) 5 MG TABS tablet TAKE 1 TABLET BY MOUTH TWICE A DAY   bisacodyl (DULCOLAX) 5 MG EC tablet Take 5 mg by  mouth daily as needed for moderate constipation.   Blood Glucose Monitoring Suppl (ACCU-CHEK AVIVA PLUS) w/Device KIT Use to check blood sugar daily DX:E11.59   cholecalciferol (VITAMIN D) 1000 units tablet Take 2,000 Units by mouth daily.   docusate sodium (COLACE) 100 MG capsule Take 200 mg by mouth at bedtime.   ferrous sulfate 325 (65 FE) MG tablet Take 1 tablet (325 mg total) by mouth 2 (two) times daily with a meal.   furosemide (LASIX) 40 MG tablet Take 2 tablets (80 mg) by mouth every other day alternating with 1 1/ tablet (60 mg) on the opposite days.   gabapentin (NEURONTIN) 100 MG capsule TAKE 1 CAPSULE (100 MG TOTAL) BY MOUTH THREE TIMES DAILY.   glucose blood (ACCU-CHEK AVIVA PLUS) test strip Use to check blood sugar daily DX:E11.59   metFORMIN (GLUCOPHAGE) 500 MG tablet TAKE 1 TABLET BY MOUTH THREE TIMES A DAY   metolazone (ZAROXOLYN) 2.5 MG tablet Take one tablet by mouth 30 minutes prior to taking your lasix, once every two weeks.   pantoprazole (PROTONIX) 40 MG tablet TAKE 1 TABLET BY MOUTH EVERY DAY   potassium chloride SA (KLOR-CON M) 20 MEQ tablet Take 1 tablet (20 mEq total) by mouth daily.   repaglinide (PRANDIN) 2 MG tablet TAKE 1 TABLET BY MOUTH 3 TIMES  DAILY BEFORE MEALS.   simvastatin (ZOCOR) 10 MG tablet TAKE 1 TABLET BY MOUTH EVERY DAY   Tiotropium Bromide-Olodaterol (STIOLTO RESPIMAT) 2.5-2.5 MCG/ACT AERS Inhale 2 puffs into the lungs daily.   vitamin B-12 (CYANOCOBALAMIN) 250 MCG tablet Take 2 tablets (500 mcg total) by mouth daily.   No current facility-administered medications for this visit. (Other)      REVIEW OF SYSTEMS: ROS   Negative for: Constitutional, Gastrointestinal, Neurological, Skin, Genitourinary, Musculoskeletal, HENT, Endocrine, Cardiovascular, Eyes, Respiratory, Psychiatric, Allergic/Imm, Heme/Lymph Last edited by Silvestre Moment on 07/22/2022  9:06 AM.       ALLERGIES No Known Allergies  PAST MEDICAL HISTORY Past Medical History:  Diagnosis  Date   Acute diastolic CHF (congestive heart failure) ([facility]) 08/04/2020   Arthritis    Atrial fibrillation ([place])    post op; amiodarone and coumadin continued for 3 mos post op   CARCINOMA, SKIN, SQUAMOUS CELL 01/07/2010   Cataract    beginning of cataracts   COLONIC POLYPS, HX OF 04/26/2007   Coronary artery disease    s/p CABG 3/12: L-LAD, S-CFX (Dr. Roxy Manns); EF 45% at cath prior to CABG   DIABETES MELLITUS, TYPE II 04/26/2007   Hyperlipidemia    HYPERTENSION 04/26/2007   NEPHROLITHIASIS, HX OF 04/26/2007   Nuclear sclerotic cataract of right eye 10/02/2021   Surgery Dr. Lucianne Lei, 06-03-2022 with posterior capsule disruption posterior dislocation lens, disruption of pupil margin superiorly   SBO (small bowel obstruction) ([facility]) 10/2015   Partial    Shortness of breath dyspnea    with exertion   Unspecified hearing loss 12/18/2008   Past Surgical History:  Procedure Laterality Date   BACK SURGERY     disectomy   CARDIOVERSION N/A 06/08/2018   Procedure: CARDIOVERSION;  Surgeon: Buford Dresser, MD;  Location: [facility];  Service: Cardiovascular;  Laterality: N/A;   CARDIOVERSION N/A 07/25/2018   Procedure: CARDIOVERSION;  Surgeon: Fay Records, MD;  Location: [facility];  Service: Cardiovascular;  Laterality: N/A;   COLONOSCOPY     CORONARY ARTERY BYPASS GRAFT  01/12/2011   LUMBAR LAMINECTOMY/DECOMPRESSION MICRODISCECTOMY N/A 05/08/2015   Procedure: LUMBAR LAMINECTOMY/DECOMPRESSION MICRODISCECTOMY 1 LEVEL Lumbar four five;  Surgeon: Melina Schools, MD;  Location: [facility];  Service: Orthopedics;  Laterality: N/A;   TOTAL HIP ARTHROPLASTY Left    left on 04/15/07   TYMPANIC MEMBRANE REPAIR  1981    FAMILY HISTORY Family History  Problem Relation Age of Onset   Heart failure Mother    Heart disease Father    Cancer Sister    Cancer Brother    Heart disease Brother    Breast cancer Sister    Colon cancer Neg Hx    Esophageal cancer Neg Hx    Rectal cancer Neg Hx    Stomach cancer  Neg Hx     SOCIAL HISTORY Social History   Tobacco Use   Smoking status: Former    Packs/day: 2.00    Years: 30.00    Total pack years: 60.00    Types: Cigarettes    Quit date: 01/02/1981    Years since quitting: 41.6   Smokeless tobacco: Never  Vaping Use   Vaping Use: Never used  Substance Use Topics   Alcohol use: No   Drug use: No         OPHTHALMIC EXAM:  Base Eye Exam     Visual Acuity (ETDRS)       Right Left   Dist [place] HM    Dist cc  20/25 -1    Correction: Glasses  Pt has right lens on glasses covered.        Tonometry (Tonopen, 9:12 AM)       Right Left   Pressure 20 11         Pupils       Pupils APD   Right PERRL None   Left PERRL None         Visual Fields       Left Right   Restrictions  Partial inner superior temporal, inferior temporal, superior nasal, inferior nasal deficiencies         Extraocular Movement       Right Left    Full, Ortho Full, Ortho         Neuro/Psych     Oriented x3: Yes   Mood/Affect: Normal         Dilation     Right eye: 2.5% Phenylephrine, 1.0% Mydriacyl @ 9:12 AM           Slit Lamp and Fundus Exam     External Exam       Right Left   External Normal Normal         Slit Lamp Exam       Right Left   Lids/Lashes Normal    Conjunctiva/Sclera 1+ Injection    Cornea wound secure,     Anterior Chamber Deep, Trace Cell, no retained lens fragments remain anterior chamber    Iris Intact Prolene sutures superiorly at 12 margin of pupil, small keyhole small irregularity remains, pupil margin improved contour    Lens Well centered IOL, small cortical remnant in peripheral capsule inf.    Anterior Vitreous Clear and avitric          Fundus Exam       Right Left   Posterior Vitreous Clear and avitric    Disc Normal    C/D Ratio 0.4    Macula Disciform scar 2.5 da size no active disease or fluid    Vessels Normal    Periphery Hemorrhagic choroidal detachment superonasal  extending almost posteriorly to the nerve.  Shallow retinal detachment also inferonasal.  Good view, no changes             IMAGING AND PROCEDURES  Imaging and Procedures for 08/10/22           ASSESSMENT/PLAN:  Choroidal detachment, hemorrhagic, right eye Under the care of Dr. Richrd Humbles who found a disc repair to retinal detachment right eye      ICD-10-CM   1. Choroidal detachment, hemorrhagic, right eye  H31.411       1.Patient presented for appointment but I canceled appointment upon his arrival as patient is in the postop care for retinal detachment repair of the right eye under Dr.Nathan Yolanda Bonine.  2.  3.  Ophthalmic Meds Ordered this visit:  No orders of the defined types were placed in this encounter.      No follow-ups on file.  There are no Patient Instructions on file for this visit.   Explained the diagnoses, plan, and follow up with the patient and they expressed understanding.  Patient expressed understanding of the importance of proper follow up care.   Clent Demark Alister Staver M.D. Diseases & Surgery of the Retina and Vitreous Retina & Diabetic [place] 08/10/22     Abbreviations: M myopia (nearsighted); A astigmatism; H hyperopia (farsighted); P presbyopia; Mrx spectacle prescription;  CTL contact lenses; OD right eye; OS left  eye; OU both eyes  XT exotropia; ET esotropia; PEK punctate epithelial keratitis; PEE punctate epithelial erosions; DES dry eye syndrome; MGD meibomian gland dysfunction; ATs artificial tears; PFAT's preservative free artificial tears; [place] nuclear sclerotic cataract; PSC posterior subcapsular cataract; ERM epi-retinal membrane; PVD posterior vitreous detachment; RD retinal detachment; DM diabetes mellitus; DR diabetic retinopathy; NPDR non-proliferative diabetic retinopathy; PDR proliferative diabetic retinopathy; CSME clinically significant macular edema; DME diabetic macular edema; dbh dot blot hemorrhages; CWS cotton wool  spot; POAG primary open angle glaucoma; C/D cup-to-disc ratio; HVF humphrey visual field; GVF goldmann visual field; OCT optical coherence tomography; IOP intraocular pressure; BRVO Branch retinal vein occlusion; CRVO central retinal vein occlusion; CRAO central retinal artery occlusion; BRAO branch retinal artery occlusion; RT retinal tear; SB scleral buckle; PPV pars plana vitrectomy; VH Vitreous hemorrhage; PRP panretinal laser photocoagulation; IVK intravitreal kenalog; VMT vitreomacular traction; MH Macular hole;  NVD neovascularization of the disc; NVE neovascularization elsewhere; AREDS age related eye disease study; ARMD age related macular degeneration; POAG primary open angle glaucoma; EBMD epithelial/anterior basement membrane dystrophy; ACIOL anterior chamber intraocular lens; IOL intraocular lens; PCIOL posterior chamber intraocular lens; Phaco/IOL phacoemulsification with intraocular lens placement; [place] photorefractive keratectomy; LASIK laser assisted in situ keratomileusis; HTN hypertension; DM diabetes mellitus; COPD chronic obstructive pulmonary disease

## 2022-08-10 NOTE — Assessment & Plan Note (Signed)
Under the care of Dr. Richrd Humbles who found a disc repair to retinal detachment right eye

## 2022-08-12 NOTE — Patient Instructions (Signed)
Health Maintenance, Male Adopting a healthy lifestyle and getting preventive care are important in promoting health and wellness. Ask your health care provider about: The right schedule for you to have regular tests and exams. Things you can do on your own to prevent diseases and keep yourself healthy. What should I know about diet, weight, and exercise? Eat a healthy diet  Eat a diet that includes plenty of vegetables, fruits, low-fat dairy products, and lean protein. Do not eat a lot of foods that are high in solid fats, added sugars, or sodium. Maintain a healthy weight Body mass index (BMI) is a measurement that can be used to identify possible weight problems. It estimates body fat based on height and weight. Your health care provider can help determine your BMI and help you achieve or maintain a healthy weight. Get regular exercise Get regular exercise. This is one of the most important things you can do for your health. Most adults should: Exercise for at least 150 minutes each week. The exercise should increase your heart rate and make you sweat (moderate-intensity exercise). Do strengthening exercises at least twice a week. This is in addition to the moderate-intensity exercise. Spend less time sitting. Even light physical activity can be beneficial. Watch cholesterol and blood lipids Have your blood tested for lipids and cholesterol at 85 years of age, then have this test every 5 years. You may need to have your cholesterol levels checked more often if: Your lipid or cholesterol levels are high. You are older than 85 years of age. You are at high risk for heart disease. What should I know about cancer screening? Many types of cancers can be detected early and may often be prevented. Depending on your health history and family history, you may need to have cancer screening at various ages. This may include screening for: Colorectal cancer. Prostate cancer. Skin cancer. Lung  cancer. What should I know about heart disease, diabetes, and high blood pressure? Blood pressure and heart disease High blood pressure causes heart disease and increases the risk of stroke. This is more likely to develop in people who have high blood pressure readings or are overweight. Talk with your health care provider about your target blood pressure readings. Have your blood pressure checked: Every 3-5 years if you are 18-39 years of age. Every year if you are 40 years old or older. If you are between the ages of 65 and 75 and are a current or former smoker, ask your health care provider if you should have a one-time screening for abdominal aortic aneurysm (AAA). Diabetes Have regular diabetes screenings. This checks your fasting blood sugar level. Have the screening done: Once every three years after age 45 if you are at a normal weight and have a low risk for diabetes. More often and at a younger age if you are overweight or have a high risk for diabetes. What should I know about preventing infection? Hepatitis B If you have a higher risk for hepatitis B, you should be screened for this virus. Talk with your health care provider to find out if you are at risk for hepatitis B infection. Hepatitis C Blood testing is recommended for: Everyone born from 1945 through 1965. Anyone with known risk factors for hepatitis C. Sexually transmitted infections (STIs) You should be screened each year for STIs, including gonorrhea and chlamydia, if: You are sexually active and are younger than 85 years of age. You are older than 85 years of age and your   health care provider tells you that you are at risk for this type of infection. Your sexual activity has changed since you were last screened, and you are at increased risk for chlamydia or gonorrhea. Ask your health care provider if you are at risk. Ask your health care provider about whether you are at high risk for HIV. Your health care provider  may recommend a prescription medicine to help prevent HIV infection. If you choose to take medicine to prevent HIV, you should first get tested for HIV. You should then be tested every 3 months for as long as you are taking the medicine. Follow these instructions at home: Alcohol use Do not drink alcohol if your health care provider tells you not to drink. If you drink alcohol: Limit how much you have to 0-2 drinks a day. Know how much alcohol is in your drink. In the [place] one drink equals one 12 oz bottle of beer (355 mL), one 5 oz glass of [place] (148 mL), or one 1 oz glass of hard liquor (44 mL). Lifestyle Do not use any products that contain nicotine or tobacco. These products include cigarettes, chewing tobacco, and vaping devices, such as e-cigarettes. If you need help quitting, ask your health care provider. Do not use street drugs. Do not share needles. Ask your health care provider for help if you need support or information about quitting drugs. General instructions Schedule regular health, dental, and eye exams. Stay current with your vaccines. Tell your health care provider if: You often feel depressed. You have ever been abused or do not feel safe at home. Summary Adopting a healthy lifestyle and getting preventive care are important in promoting health and wellness. Follow your health care provider's instructions about healthy diet, exercising, and getting tested or screened for diseases. Follow your health care provider's instructions on monitoring your cholesterol and blood pressure. This information is not intended to replace advice given to you by your health care provider. Make sure you discuss any questions you have with your health care provider. Document Revised: 03/24/2021 Document Reviewed: 03/24/2021 [employer] Patient Education  2023 [employer]  

## 2022-08-12 NOTE — Progress Notes (Addendum)
Subjective:   [name] is a 87 y.o. male who presents for Medicare Annual/Subsequent preventive examination. I connected with  [name] on 08/13/22 by a audio enabled telemedicine application and verified that I am speaking with the correct person using two identifiers.  Patient Location: Home  Provider Location: Home Office  I discussed the limitations of evaluation and management by telemedicine. The patient expressed understanding and agreed to proceed.  Review of Systems    Deferred to PCP       Objective:    Today's Vitals   08/13/22 1010  PainSc: 5    There is no height or weight on file to calculate BMI.     08/13/2022   10:30 AM 12/02/2021   11:31 PM 05/29/2021    1:29 PM 08/04/2020   11:20 PM 08/04/2020    7:24 AM 10/25/2019    5:36 AM 10/24/2019    8:17 PM  Advanced Directives  Does Patient Have a Medical Advance Directive? No No No  No  No  Would patient like information on creating a medical advance directive? No - Patient declined No - Patient declined No - Patient declined No - Patient declined  No - Patient declined     Current Medications (verified) Outpatient Encounter Medications as of 08/13/2022  Medication Sig   Accu-Chek Softclix Lancets lancets Use to check blood sugar daily DX:E11.59   amiodarone (PACERONE) 200 MG tablet TAKE 1 TABLET (200 MG TOTAL) BY MOUTH DAILY. EXCEPT SATURDAY AND SUNDAY.   apixaban (ELIQUIS) 5 MG TABS tablet TAKE 1 TABLET BY MOUTH TWICE A DAY   bisacodyl (DULCOLAX) 5 MG EC tablet Take 5 mg by mouth daily as needed for moderate constipation.   Blood Glucose Monitoring Suppl (ACCU-CHEK AVIVA PLUS) w/Device KIT Use to check blood sugar daily DX:E11.59   cholecalciferol (VITAMIN D) 1000 units tablet Take 2,000 Units by mouth daily.   docusate sodium (COLACE) 100 MG capsule Take 200 mg by mouth at bedtime.   ferrous sulfate 325 (65 FE) MG tablet Take 1 tablet (325 mg total) by mouth 2 (two) times daily with a  meal.   furosemide (LASIX) 40 MG tablet Take 2 tablets (80 mg) by mouth every other day alternating with 1 1/ tablet (60 mg) on the opposite days.   gabapentin (NEURONTIN) 100 MG capsule TAKE 1 CAPSULE (100 MG TOTAL) BY MOUTH THREE TIMES DAILY.   glucose blood (ACCU-CHEK AVIVA PLUS) test strip Use to check blood sugar daily DX:E11.59   latanoprost (XALATAN) 0.005 % ophthalmic solution Place 1 drop into both eyes daily.   metFORMIN (GLUCOPHAGE) 500 MG tablet TAKE 1 TABLET BY MOUTH THREE TIMES A DAY   metolazone (ZAROXOLYN) 2.5 MG tablet Take one tablet by mouth 30 minutes prior to taking your lasix, once every two weeks.   pantoprazole (PROTONIX) 40 MG tablet TAKE 1 TABLET BY MOUTH EVERY DAY   potassium chloride SA (KLOR-CON M) 20 MEQ tablet Take 1 tablet (20 mEq total) by mouth daily.   Propylene Glycol 0.6 % SOLN Place 1 drop into the left eye 2 (two) times daily as needed (for dry eyes).   repaglinide (PRANDIN) 2 MG tablet TAKE 1 TABLET BY MOUTH 3 TIMES DAILY BEFORE MEALS.   simvastatin (ZOCOR) 10 MG tablet TAKE 1 TABLET BY MOUTH EVERY DAY   vitamin B-12 (CYANOCOBALAMIN) 250 MCG tablet Take 2 tablets (500 mcg total) by mouth daily.   albuterol (VENTOLIN HFA) 108 (90 Base) MCG/ACT inhaler TAKE 2 PUFFS  BY MOUTH EVERY 6 HOURS AS NEEDED FOR WHEEZE OR SHORTNESS OF BREATH (Patient not taking: Reported on 08/13/2022)   Tiotropium Bromide-Olodaterol (STIOLTO RESPIMAT) 2.5-2.5 MCG/ACT AERS Inhale 2 puffs into the lungs daily. (Patient not taking: Reported on 08/13/2022)   No facility-administered encounter medications on file as of 08/13/2022.    Allergies (verified) Patient has no known allergies.   History: Past Medical History:  Diagnosis Date   Acute diastolic CHF (congestive heart failure) ([place]) 08/04/2020   Arthritis    Atrial fibrillation ([place])    post op; amiodarone and coumadin continued for 3 mos post op   CARCINOMA, SKIN, SQUAMOUS CELL 01/07/2010   Cataract    beginning of cataracts    COLONIC POLYPS, HX OF 04/26/2007   Coronary artery disease    s/p CABG 3/12: L-LAD, S-CFX (Dr. Roxy Manns); EF 45% at cath prior to CABG   DIABETES MELLITUS, TYPE II 04/26/2007   Hyperlipidemia    HYPERTENSION 04/26/2007   NEPHROLITHIASIS, HX OF 04/26/2007   Nuclear sclerotic cataract of right eye 10/02/2021   Surgery Dr. Lucianne Lei, 06-03-2022 with posterior capsule disruption posterior dislocation lens, disruption of pupil margin superiorly   SBO (small bowel obstruction) ([place]) 10/2015   Partial    Shortness of breath dyspnea    with exertion   Unspecified hearing loss 12/18/2008   Past Surgical History:  Procedure Laterality Date   BACK SURGERY     disectomy   CARDIOVERSION N/A 06/08/2018   Procedure: CARDIOVERSION;  Surgeon: Buford Dresser, MD;  Location: [facility];  Service: Cardiovascular;  Laterality: N/A;   CARDIOVERSION N/A 07/25/2018   Procedure: CARDIOVERSION;  Surgeon: Fay Records, MD;  Location: [facility];  Service: Cardiovascular;  Laterality: N/A;   COLONOSCOPY     CORONARY ARTERY BYPASS GRAFT  01/12/2011   LUMBAR LAMINECTOMY/DECOMPRESSION MICRODISCECTOMY N/A 05/08/2015   Procedure: LUMBAR LAMINECTOMY/DECOMPRESSION MICRODISCECTOMY 1 LEVEL Lumbar four five;  Surgeon: Melina Schools, MD;  Location: [facility];  Service: Orthopedics;  Laterality: N/A;   TOTAL HIP ARTHROPLASTY Left    left on 04/15/07   TYMPANIC MEMBRANE REPAIR  1981   Family History  Problem Relation Age of Onset   Heart failure Mother    Heart disease Father    Cancer Sister    Cancer Brother    Heart disease Brother    Breast cancer Sister    Colon cancer Neg Hx    Esophageal cancer Neg Hx    Rectal cancer Neg Hx    Stomach cancer Neg Hx    Social History   Socioeconomic History   Marital status: Married    Spouse name: Not on file   Number of children: 2   Years of education: 9th   Highest education level: 9th grade  Occupational History   Not on file  Tobacco Use   Smoking status: Former     Packs/day: 2.00    Years: 30.00    Total pack years: 60.00    Types: Cigarettes    Quit date: 01/02/1981    Years since quitting: 41.6   Smokeless tobacco: Never  Vaping Use   Vaping Use: Never used  Substance and Sexual Activity   Alcohol use: No   Drug use: No   Sexual activity: Not Currently  Other Topics Concern   Not on file  Social History Narrative   Not on file   Social Determinants of Health   Financial Resource Strain: Low Risk  (08/13/2022)   Overall Financial Resource Strain (CARDIA)  Difficulty of Paying Living Expenses: Not very hard  Food Insecurity: No Food Insecurity (08/13/2022)   Hunger Vital Sign    Worried About Running Out of Food in the Last Year: Never true    Ran Out of Food in the Last Year: Never true  Transportation Needs: No Transportation Needs (08/13/2022)   PRAPARE - Hydrologist (Medical): No    Lack of Transportation (Non-Medical): No  Physical Activity: Inactive (08/13/2022)   Exercise Vital Sign    Days of Exercise per Week: 0 days    Minutes of Exercise per Session: 0 min  Stress: No Stress Concern Present (08/13/2022)   [place]    Feeling of Stress : Only a little  Social Connections: Socially Integrated (08/13/2022)   Social Connection and Isolation Panel [NHANES]    Frequency of Communication with Friends and Family: More than three times a week    Frequency of Social Gatherings with Friends and Family: Once a week    Attends Religious Services: More than 4 times per year    Active Member of [employer] or Organizations: Yes    Attends Music therapist: More than 4 times per year    Marital Status: Married    Tobacco Counseling Counseling given: Not Answered   Clinical Intake:  Pre-visit preparation completed: Yes  Pain : 0-10 Pain Score: 5  Pain Type: Chronic pain Pain Location: Generalized (bilateral shoulders) Pain  Descriptors / Indicators: Aching, Dull, Discomfort     Nutritional Status: BMI > 30  Obese Nutritional Risks: None Diabetes: Yes CBG done?: No (phone visit) Did pt. bring in CBG monitor from home?: No  How often do you need to have someone help you when you read instructions, pamphlets, or other written materials from your doctor or pharmacy?: 1 - Never What is the last grade level you completed in school?: 9th  Diabetic?Yes Nutrition Risk Assessment:  Has the patient had any N/V/D within the last 2 months?  No  Does the patient have any non-healing wounds?  No  Has the patient had any unintentional weight loss or weight gain?  No   Diabetes:  Is the patient diabetic?  Yes  If diabetic, was a CBG obtained today?  No  Did the patient bring in their glucometer from home?  No , phone visit How often do you monitor your CBG's? Every other day.   Financial Strains and Diabetes Management:  Are you having any financial strains with the device, your supplies or your medication? No .  Does the patient want to be seen by Chronic Care Management for management of their diabetes?  No  Would the patient like to be referred to a Nutritionist or for Diabetic Management?  No   Diabetic Exams:  Diabetic Eye Exam: Completed 03/26/22 Diabetic Foot Exam: Overdue, Pt has been advised about the importance in completing this exam. Pt is scheduled for diabetic foot exam on deferred to PCP.   Interpreter Needed?: No  Information entered by :: Emelia Loron RN   Activities of Daily Living     No data to display          Patient Care Team: [name], Evie Lacks, MD as PCP - General (Internal Medicine) Fay Records, MD as PCP - Cardiology (Cardiology) Melina Schools, MD as Consulting Physician (Orthopedic Surgery) Suella Broad, MD as Consulting Physician (Physical Medicine and Rehabilitation) Zadie Rhine Clent Demark, MD as Consulting Physician (Ophthalmology)  Melina Schools, MD as Consulting  Physician (Orthopedic Surgery) Szabat, Darnelle Maffucci, [facility] (Inactive) as Pharmacist (Pharmacist)  Indicate any recent Medical Services you may have received from other than [facility] providers in the past year (date may be approximate).     Assessment:   This is a routine wellness examination for [place].  Hearing/Vision screen No results found.  Dietary issues and exercise activities discussed:     Goals Addressed             This Visit's Progress    Patient Stated       Maintain current health and wellness. Stay as independent as possible.      Depression Screen    08/13/2022   10:18 AM 06/01/2022    2:53 PM 05/29/2021    1:34 PM 09/10/2020    4:23 PM 07/02/2017    2:27 PM 05/26/2016    4:03 PM 05/21/2015    1:25 PM  PHQ 2/9 Scores  PHQ - 2 Score 1 0 0 0 2 0 0  PHQ- 9 Score     5      Fall Risk    08/13/2022   10:28 AM 06/01/2022    2:53 PM 05/29/2021    1:33 PM 09/10/2020    4:23 PM 07/05/2018    2:55 PM  Fall Risk   Falls in the past year? 1 0 1 0 No  Number falls in past yr: 1 0 0 0   Injury with Fall? 1 0 1 0   Comment   hurt tailbone    Risk for fall due to : History of fall(s);Impaired balance/gait;Impaired mobility;Impaired vision No Fall Risks  No Fall Risks   Follow up Falls evaluation completed;Education provided Falls evaluation completed  Falls evaluation completed     [place]:  Any stairs in or around the home? No  If so, are there any without handrails?  N/A Home free of loose throw rugs in walkways, pet beds, electrical cords, etc? Yes  Adequate lighting in your home to reduce risk of falls? Yes   ASSISTIVE DEVICES UTILIZED TO PREVENT FALLS:  Life alert? No  Use of a cane, walker or w/c? Yes  Grab bars in the bathroom? Yes  Shower chair or bench in shower? No  Elevated toilet seat or a handicapped toilet? Yes   Cognitive Function:    07/02/2017    2:33 PM  MMSE - Mini Mental State Exam  Orientation to time 5   Orientation to Place 5  Registration 3  Attention/ Calculation 4  Recall 2  Language- name 2 objects 2  Language- repeat 1  Language- follow 3 step command 3  Language- read & follow direction 1  Write a sentence 1  Copy design 1  Total score 28        08/13/2022   10:26 AM  6CIT Screen  What Year? 0 points  What month? 0 points  What time? 0 points  Count back from 20 0 points  Months in reverse 0 points  Repeat phrase 2 points  Total Score 2 points    Immunizations Immunization History  Administered Date(s) Administered   Fluad Quad(high Dose 65+) 08/18/2019, 11/29/2020, 09/01/2021   Influenza Nasal 08/28/2016   Influenza Whole 09/14/2007, 09/01/2010   Influenza, High Dose Seasonal PF 09/13/2009, 08/01/2012, 08/29/2014, 09/06/2017, 08/16/2018   Influenza,inj,Quad PF,6+ Mos 08/20/2015   Influenza-Unspecified 08/16/2008, 09/16/2008, 08/28/2010, 08/17/2011, 08/25/2013, 08/16/2014   PFIZER(Purple Top)SARS-COV-2 Vaccination 02/23/2020, 03/19/2020  Pneumococcal Conjugate-13 08/16/2014, 08/29/2014   Pneumococcal Polysaccharide-23 10/16/2002, 02/18/2016   Pneumococcal-Unspecified 08/16/2005, 08/28/2010   Td 11/16/1993, 12/18/2008   Tdap 11/16/2009   Zoster, Live 07/24/2011    TDAP status: Due, Education has been provided regarding the importance of this vaccine. Advised may receive this vaccine at local pharmacy or Health Dept. Aware to provide a copy of the vaccination record if obtained from local pharmacy or Health Dept. Verbalized acceptance and understanding.  Flu Vaccine status: Due, Education has been provided regarding the importance of this vaccine. Advised may receive this vaccine at local pharmacy or Health Dept. Aware to provide a copy of the vaccination record if obtained from local pharmacy or Health Dept. Verbalized acceptance and understanding.  Pneumococcal vaccine status: Up to date  Covid-19 vaccine status: Information provided on how to obtain  vaccines.   Qualifies for Shingles Vaccine? Yes   Zostavax completed No   Shingrix Completed?: No.    Education has been provided regarding the importance of this vaccine. Patient has been advised to call insurance company to determine out of pocket expense if they have not yet received this vaccine. Advised may also receive vaccine at local pharmacy or Health Dept. Verbalized acceptance and understanding.  Screening Tests Health Maintenance  Topic Date Due   Zoster Vaccines- Shingrix (1 of 2) Never done   Diabetic kidney evaluation - Urine ACR  04/05/2019   FOOT EXAM  07/06/2019   TETANUS/TDAP  11/17/2019   COVID-19 Vaccine (3 - Pfizer risk series) 04/16/2020   OPHTHALMOLOGY EXAM  04/30/2022   INFLUENZA VACCINE  02/14/2023 (Originally 06/16/2022)   Diabetic kidney evaluation - GFR measurement  05/22/2023   Pneumonia Vaccine 60+ Years old  Completed   HPV VACCINES  Aged Out    Health Maintenance  Health Maintenance Due  Topic Date Due   Zoster Vaccines- Shingrix (1 of 2) Never done   Diabetic kidney evaluation - Urine ACR  04/05/2019   FOOT EXAM  07/06/2019   TETANUS/TDAP  11/17/2019   COVID-19 Vaccine (3 - Pfizer risk series) 04/16/2020   OPHTHALMOLOGY EXAM  04/30/2022    Colorectal cancer screening: No longer required.   Lung Cancer Screening: (Low Dose CT Chest recommended if Age 43-80 years, 30 pack-year currently smoking OR have quit w/in 15years.) does not qualify.   Additional Screening:  Hepatitis C Screening: does not qualify  Vision Screening: Recommended annual ophthalmology exams for early detection of glaucoma and other disorders of the eye. Is the patient up to date with their annual eye exam?  Yes  Who is the provider or what is the name of the office in which the patient attends annual eye exams? Dr. Lucianne Lei: 03/26/22 If pt is not established with a provider, would they like to be referred to a provider to establish care?  N/A .   Dental Screening: Recommended  annual dental exams for proper oral hygiene  Community Resource Referral / Chronic Care Management: CRR required this visit?  No   CCM required this visit?  No      Plan:     I have personally reviewed and noted the following in the patient's chart:   Medical and social history Use of alcohol, tobacco or illicit drugs  Current medications and supplements including opioid prescriptions. Patient is not currently taking opioid prescriptions. Functional ability and status Nutritional status Physical activity Advanced directives List of other physicians Hospitalizations, surgeries, and ER visits in previous 12 months Vitals Screenings to include cognitive, depression, and falls  Referrals and appointments  In addition, I have reviewed and discussed with patient certain preventive protocols, quality metrics, and best practice recommendations. A written personalized care plan for preventive services as well as general preventive health recommendations were provided to patient.     Michiel Cowboy, RN   08/13/2022   Nurse Notes:  Mr. Mickelson , Thank you for taking time to come for your Medicare Wellness Visit. I appreciate your ongoing commitment to your health goals. Please review the following plan we discussed and let me know if I can assist you in the future.   These are the goals we discussed:  Goals       Patient Stated     Patient Stated (pt-stated)      My goal is to stay alive and get better vision, because right now I am dealing with macular degeneration in my left eye.      Other     Increase the amount that I walk      I will get up go to the [place] and walk once a week      Monitor and Manage My Blood Sugar-Diabetes Type 2      Timeframe:  Long-Range Goal Priority:  High Start Date: 07/15/2021                            Expected End Date:  10/16/2022                     Follow Up Date 05/06/2022   - check blood sugar at prescribed times - check blood sugar  before and after exercise - check blood sugar if I feel it is too high or too low - take the blood sugar log to all doctor visits - take the blood sugar meter to all doctor visits    Why is this important?   Checking your blood sugar at home helps to keep it from getting very high or very low.  Writing the results in a diary or log helps the doctor know how to care for you.  Your blood sugar log should have the time, date and the results.  Also, write down the amount of insulin or other medicine that you take.  Other information, like what you ate, exercise done and how you were feeling, will also be helpful.        Patient Stated      Maintain current health and wellness. Stay as independent as possible.      Track and Manage My Blood Pressure and Heart Rate -Hypertension/ Afib      Timeframe:  Long-Range Goal Priority:  High Start Date:   07/15/2021                          Expected End Date: 10/16/2022                      Follow Up Date 05/06/2022   - check blood pressure 3 times per week - choose a place to take my blood pressure (home, clinic or office, retail store) - write blood pressure results in a log or diary    Why is this important?   You won't feel high blood pressure, but it can still hurt your blood vessels.  High blood pressure can cause heart or kidney problems. It can also cause a stroke.  Making lifestyle  changes like losing a little weight or eating less salt will help.  Checking your blood pressure at home and at different times of the day can help to control blood pressure.  If the doctor prescribes medicine remember to take it the way the doctor ordered.  Call the office if you cannot afford the medicine or if there are questions about it.           This is a list of the screening recommended for you and due dates:  Health Maintenance  Topic Date Due   Zoster (Shingles) Vaccine (1 of 2) Never done   Yearly kidney health urinalysis for diabetes   04/05/2019   Complete foot exam   07/06/2019   Tetanus Vaccine  11/17/2019   COVID-19 Vaccine (3 - Pfizer risk series) 04/16/2020   Eye exam for diabetics  04/30/2022   Flu Shot  02/14/2023*   Yearly kidney function blood test for diabetes  05/22/2023   Pneumonia Vaccine  Completed   HPV Vaccine  Aged Out  *Topic was postponed. The date shown is not the original due date.     Medical screening examination/treatment/procedure(s) were performed by non-physician practitioner and as supervising physician I was immediately available for consultation/collaboration.  I agree with above. Lew Dawes, MD

## 2022-08-13 ENCOUNTER — Ambulatory Visit (INDEPENDENT_AMBULATORY_CARE_PROVIDER_SITE_OTHER): Payer: Medicare Other | Admitting: *Deleted

## 2022-08-13 DIAGNOSIS — Z0000 Encounter for general adult medical examination without abnormal findings: Secondary | ICD-10-CM

## 2022-08-18 DIAGNOSIS — M7541 Impingement syndrome of right shoulder: Secondary | ICD-10-CM | POA: Diagnosis not present

## 2022-08-18 DIAGNOSIS — M25512 Pain in left shoulder: Secondary | ICD-10-CM | POA: Diagnosis not present

## 2022-08-18 DIAGNOSIS — R200 Anesthesia of skin: Secondary | ICD-10-CM | POA: Diagnosis not present

## 2022-08-18 DIAGNOSIS — M25511 Pain in right shoulder: Secondary | ICD-10-CM | POA: Diagnosis not present

## 2022-08-20 DIAGNOSIS — H33021 Retinal detachment with multiple breaks, right eye: Secondary | ICD-10-CM | POA: Diagnosis not present

## 2022-08-20 DIAGNOSIS — Z8669 Personal history of other diseases of the nervous system and sense organs: Secondary | ICD-10-CM | POA: Diagnosis not present

## 2022-08-20 DIAGNOSIS — H353131 Nonexudative age-related macular degeneration, bilateral, early dry stage: Secondary | ICD-10-CM | POA: Diagnosis not present

## 2022-09-01 ENCOUNTER — Ambulatory Visit: Payer: Medicare Other | Admitting: Internal Medicine

## 2022-09-07 ENCOUNTER — Emergency Department (HOSPITAL_COMMUNITY)
Admission: EM | Admit: 2022-09-07 | Discharge: 2022-09-07 | Disposition: A | Discharge status: Home / Self Care | Payer: Medicare Other | Source: Home / Self Care

## 2022-09-18 DIAGNOSIS — T85398A Other mechanical complication of other ocular prosthetic devices, implants and grafts, initial encounter: Secondary | ICD-10-CM | POA: Diagnosis not present

## 2022-09-18 DIAGNOSIS — H59811 Chorioretinal scars after surgery for detachment, right eye: Secondary | ICD-10-CM | POA: Diagnosis not present

## 2022-09-27 ENCOUNTER — Other Ambulatory Visit: Payer: Self-pay | Admitting: Internal Medicine

## 2022-10-13 ENCOUNTER — Encounter: Payer: Self-pay | Admitting: Internal Medicine

## 2022-10-13 ENCOUNTER — Ambulatory Visit (INDEPENDENT_AMBULATORY_CARE_PROVIDER_SITE_OTHER): Payer: Medicare Other | Admitting: Internal Medicine

## 2022-10-13 VITALS — BP 122/70 | HR 78 | Temp 97.6°F | Ht 68.0 in | Wt 221.0 lb

## 2022-10-13 DIAGNOSIS — G8929 Other chronic pain: Secondary | ICD-10-CM | POA: Diagnosis not present

## 2022-10-13 DIAGNOSIS — E538 Deficiency of other specified B group vitamins: Secondary | ICD-10-CM

## 2022-10-13 DIAGNOSIS — I2510 Atherosclerotic heart disease of native coronary artery without angina pectoris: Secondary | ICD-10-CM

## 2022-10-13 DIAGNOSIS — Z23 Encounter for immunization: Secondary | ICD-10-CM | POA: Diagnosis not present

## 2022-10-13 DIAGNOSIS — M545 Low back pain, unspecified: Secondary | ICD-10-CM

## 2022-10-13 DIAGNOSIS — E1159 Type 2 diabetes mellitus with other circulatory complications: Secondary | ICD-10-CM

## 2022-10-13 LAB — HEMOGLOBIN A1C: Hgb A1c MFr Bld: 6.7 % — ABNORMAL HIGH (ref 4.6–6.5)

## 2022-10-13 LAB — TSH: TSH: 10.85 u[IU]/mL — ABNORMAL HIGH (ref 0.35–5.50)

## 2022-10-13 LAB — COMPREHENSIVE METABOLIC PANEL
ALT: 19 U/L (ref 0–53)
AST: 24 U/L (ref 0–37)
Albumin: 4.1 g/dL (ref 3.5–5.2)
Alkaline Phosphatase: 46 U/L (ref 39–117)
BUN: 26 mg/dL — ABNORMAL HIGH (ref 6–23)
CO2: 27 mEq/L (ref 19–32)
Calcium: 9.1 mg/dL (ref 8.4–10.5)
Chloride: 103 mEq/L (ref 96–112)
Creatinine, Ser: 1.49 mg/dL (ref 0.40–1.50)
GFR: 42.47 mL/min — ABNORMAL LOW (ref >60.00)
Glucose, Bld: 170 mg/dL — ABNORMAL HIGH (ref 70–99)
Potassium: 3.8 mEq/L (ref 3.5–5.1)
Sodium: 141 mEq/L (ref 135–145)
Total Bilirubin: 0.5 mg/dL (ref 0.2–1.2)
Total Protein: 7.2 g/dL (ref 6.0–8.3)

## 2022-10-13 NOTE — Assessment & Plan Note (Signed)
Blue-Emu cream was recommended to use 2-3 times a day Start using a rollator walker if worse

## 2022-10-13 NOTE — Assessment & Plan Note (Signed)
Cont on Metformin, Prandin Metformin 500 mg tid  BMET, A1c q 3 mo

## 2022-10-13 NOTE — Addendum Note (Signed)
Addended by: Basil Dess on: 10/13/2022 03:46 PM   Modules accepted: Orders

## 2022-10-13 NOTE — Progress Notes (Signed)
Subjective:  Patient ID: [name], male    DOB: 1935-03-19  Age: 87 y.o. MRN: [id number]  CC: Follow-up   HPI [name] presents for LBP, A fib, HTN C/o insomnia - pt declined rx  Outpatient Medications Prior to Visit  Medication Sig Dispense Refill   Accu-Chek Softclix Lancets lancets Use to check blood sugar daily DX:E11.59 100 each 3   amiodarone (PACERONE) 200 MG tablet TAKE 1 TABLET (200 MG TOTAL) BY MOUTH DAILY. EXCEPT SATURDAY AND SUNDAY. 90 tablet 3   apixaban (ELIQUIS) 5 MG TABS tablet TAKE 1 TABLET BY MOUTH TWICE A DAY 60 tablet 10   bisacodyl (DULCOLAX) 5 MG EC tablet Take 5 mg by mouth daily as needed for moderate constipation.     Blood Glucose Monitoring Suppl (ACCU-CHEK AVIVA PLUS) w/Device KIT Use to check blood sugar daily DX:E11.59 1 kit 0   cholecalciferol (VITAMIN D) 1000 units tablet Take 2,000 Units by mouth daily.     docusate sodium (COLACE) 100 MG capsule Take 200 mg by mouth at bedtime.     ferrous sulfate 325 (65 FE) MG tablet Take 1 tablet (325 mg total) by mouth 2 (two) times daily with a meal. 60 tablet 6   furosemide (LASIX) 40 MG tablet Take 2 tablets (80 mg) by mouth every other day alternating with 1 1/ tablet (60 mg) on the opposite days. 180 tablet 3   gabapentin (NEURONTIN) 100 MG capsule TAKE 1 CAPSULE (100 MG TOTAL) BY MOUTH THREE TIMES DAILY. 270 capsule 3   glucose blood (ACCU-CHEK AVIVA PLUS) test strip Use to check blood sugar daily DX:E11.59 100 each 3   metFORMIN (GLUCOPHAGE) 500 MG tablet TAKE 1 TABLET BY MOUTH THREE TIMES A DAY 270 tablet 3   metolazone (ZAROXOLYN) 2.5 MG tablet Take one tablet by mouth 30 minutes prior to taking your lasix, once every two weeks. 30 tablet 3   pantoprazole (PROTONIX) 40 MG tablet TAKE 1 TABLET BY MOUTH EVERY DAY 90 tablet 3   potassium chloride SA (KLOR-CON M) 20 MEQ tablet Take 1 tablet (20 mEq total) by mouth daily. 90 tablet 3   repaglinide (PRANDIN) 2 MG tablet TAKE 1 TABLET BY MOUTH  3 TIMES DAILY BEFORE MEALS. 270 tablet 3   simvastatin (ZOCOR) 10 MG tablet TAKE 1 TABLET BY MOUTH EVERY DAY 90 tablet 3   vitamin B-12 (CYANOCOBALAMIN) 250 MCG tablet Take 2 tablets (500 mcg total) by mouth daily.     albuterol (VENTOLIN HFA) 108 (90 Base) MCG/ACT inhaler TAKE 2 PUFFS BY MOUTH EVERY 6 HOURS AS NEEDED FOR WHEEZE OR SHORTNESS OF BREATH (Patient not taking: Reported on 08/13/2022) 6.7 each 2   latanoprost (XALATAN) 0.005 % ophthalmic solution Place 1 drop into both eyes daily. (Patient not taking: Reported on 10/13/2022)     Propylene Glycol 0.6 % SOLN Place 1 drop into the left eye 2 (two) times daily as needed (for dry eyes). (Patient not taking: Reported on 10/13/2022)     Tiotropium Bromide-Olodaterol (STIOLTO RESPIMAT) 2.5-2.5 MCG/ACT AERS Inhale 2 puffs into the lungs daily. (Patient not taking: Reported on 08/13/2022) 4 g 0   No facility-administered medications prior to visit.    ROS: Review of Systems  Constitutional:  Positive for fatigue. Negative for appetite change and unexpected weight change.  HENT:  Negative for congestion, nosebleeds, sneezing, sore throat and trouble swallowing.   Eyes:  Positive for visual disturbance. Negative for itching.  Respiratory:  Negative for cough.   Cardiovascular:  Negative for chest pain, palpitations and leg swelling.  Gastrointestinal:  Negative for abdominal distention, blood in stool, diarrhea and nausea.  Genitourinary:  Negative for frequency and hematuria.  Musculoskeletal:  Positive for arthralgias, back pain, gait problem and neck pain. Negative for joint swelling.  Skin:  Negative for rash.  Neurological:  Negative for dizziness, tremors, speech difficulty and weakness.  Psychiatric/Behavioral:  Positive for sleep disturbance. Negative for agitation, dysphoric mood and suicidal ideas. The patient is not nervous/anxious.     Objective:  BP 122/70 (BP Location: Left Arm, Patient Position: Sitting, Cuff Size: Normal)    Pulse 78   Temp 97.6 F (36.4 C) (Oral)   Ht _0  (1.727 m)   Wt 221 lb (100.2 kg)   SpO2 96%   BMI 33.60 kg/m   BP Readings from Last 3 Encounters:  10/13/22 122/70  06/01/22 140/78  03/04/22 120/70    Wt Readings from Last 3 Encounters:  10/13/22 221 lb (100.2 kg)  06/01/22 218 lb (98.9 kg)  03/04/22 216 lb (98 kg)    Physical Exam Constitutional:      General: He is not in acute distress.    Appearance: He is well-developed. He is obese.     Comments: NAD  Eyes:     Conjunctiva/sclera: Conjunctivae normal.     Pupils: Pupils are equal, round, and reactive to light.  Neck:     Thyroid: No thyromegaly.     Vascular: No JVD.  Cardiovascular:     Rate and Rhythm: Normal rate and regular rhythm.     Heart sounds: Normal heart sounds. No murmur heard.    No friction rub. No gallop.  Pulmonary:     Effort: Pulmonary effort is normal. No respiratory distress.     Breath sounds: Normal breath sounds. No wheezing or rales.  Chest:     Chest wall: No tenderness.  Abdominal:     General: Bowel sounds are normal. There is no distension.     Palpations: Abdomen is soft. There is no mass.     Tenderness: There is no abdominal tenderness. There is no guarding or rebound.  Musculoskeletal:        General: Tenderness present. Normal range of motion.     Cervical back: Normal range of motion.     Right lower leg: No edema.     Left lower leg: No edema.  Lymphadenopathy:     Cervical: No cervical adenopathy.  Skin:    General: Skin is warm and dry.     Findings: No rash.  Neurological:     Mental Status: He is alert and oriented to person, place, and time.     Cranial Nerves: No cranial nerve deficit.     Motor: No abnormal muscle tone.     Coordination: Coordination abnormal.     Gait: Gait normal.     Deep Tendon Reflexes: Reflexes are normal and symmetric.  Psychiatric:        Behavior: Behavior normal.        Thought Content: Thought content normal.         Judgment: Judgment normal.    Antalgic gait   Lab Results  Component Value Date   WBC 6.1 01/14/2022   HGB 13.5 01/14/2022   HCT 38.5 01/14/2022   PLT 227 01/14/2022   GLUCOSE 146 (H) 05/21/2022   CHOL 114 01/14/2022   TRIG 120 01/14/2022   HDL 38 (L) 01/14/2022   LDLCALC 54 01/14/2022   ALT  23 03/04/2022   AST 26 03/04/2022   NA 144 05/21/2022   K 4.0 05/21/2022   CL 105 05/21/2022   CREATININE 1.34 (H) 05/21/2022   BUN 22 05/21/2022   CO2 23 05/21/2022   TSH 12.300 (H) 01/14/2022   PSA 1.18 07/02/2017   INR 2.3 03/03/2012   HGBA1C 6.9 (H) 03/04/2022   MICROALBUR 0.7 04/04/2018    No results found.  Assessment & Plan:   Problem List Items Addressed This Visit     B12 deficiency    On B12      Back pain    Blue-Emu cream was recommended to use 2-3 times a day Start using a rollator walker if worse      CAD (coronary artery disease), native coronary artery (Chronic)    Cont w/Eliquis, Amlodipine, Lopressor, Zocor      Relevant Orders   Comprehensive metabolic panel   Diabetes mellitus, type 2 (HCC) - Primary (Chronic)    Cont on Metformin, Prandin Metformin 500 mg tid  BMET, A1c q 3 mo      Relevant Orders   Comprehensive metabolic panel   Hemoglobin A1c   TSH      No orders of the defined types were placed in this encounter.     Follow-up: Return in about 3 months (around 01/13/2023) for a follow-up visit.  Walker Kehr, MD

## 2022-10-13 NOTE — Assessment & Plan Note (Signed)
Cont w/Eliquis, Amlodipine, Lopressor, Zocor

## 2022-10-13 NOTE — Assessment & Plan Note (Signed)
On B12 

## 2022-10-14 ENCOUNTER — Other Ambulatory Visit (INDEPENDENT_AMBULATORY_CARE_PROVIDER_SITE_OTHER): Payer: Medicare Other

## 2022-10-14 DIAGNOSIS — E1159 Type 2 diabetes mellitus with other circulatory complications: Secondary | ICD-10-CM | POA: Diagnosis not present

## 2022-10-14 LAB — T4, FREE: Free T4: 0.62 ng/dL (ref 0.60–1.60)

## 2022-10-19 ENCOUNTER — Encounter: Payer: Self-pay | Admitting: Internal Medicine

## 2022-10-19 DIAGNOSIS — E782 Mixed hyperlipidemia: Secondary | ICD-10-CM

## 2022-10-19 DIAGNOSIS — I2510 Atherosclerotic heart disease of native coronary artery without angina pectoris: Secondary | ICD-10-CM

## 2022-10-20 NOTE — Telephone Encounter (Signed)
Lipomed

## 2022-10-31 ENCOUNTER — Other Ambulatory Visit: Payer: Self-pay | Admitting: Internal Medicine

## 2022-11-05 DIAGNOSIS — M25512 Pain in left shoulder: Secondary | ICD-10-CM | POA: Diagnosis not present

## 2022-11-05 DIAGNOSIS — M25511 Pain in right shoulder: Secondary | ICD-10-CM | POA: Diagnosis not present

## 2022-11-17 ENCOUNTER — Ambulatory Visit: Payer: Medicare Other | Attending: Internal Medicine

## 2022-11-17 DIAGNOSIS — E782 Mixed hyperlipidemia: Secondary | ICD-10-CM | POA: Diagnosis not present

## 2022-11-17 DIAGNOSIS — I2510 Atherosclerotic heart disease of native coronary artery without angina pectoris: Secondary | ICD-10-CM

## 2022-11-18 LAB — NMR, LIPOPROFILE
Cholesterol, Total: 134 mg/dL (ref 100–199)
HDL Particle Number: 32.8 umol/L (ref >=30.5)
HDL-C: 48 mg/dL (ref >39)
LDL Particle Number: 720 nmol/L (ref <1000)
LDL Size: 20.6 nm (ref >20.5)
LDL-C (NIH Calc): 63 mg/dL (ref 0–99)
LP-IR Score: 49 — ABNORMAL HIGH (ref <=45)
Small LDL Particle Number: 382 nmol/L (ref <=527)
Triglycerides: 130 mg/dL (ref 0–149)

## 2022-11-20 ENCOUNTER — Ambulatory Visit: Payer: Medicare Other | Attending: Internal Medicine | Admitting: Internal Medicine

## 2022-11-20 VITALS — BP 124/76 | HR 86 | Ht 68.0 in | Wt 213.4 lb

## 2022-11-20 DIAGNOSIS — E782 Mixed hyperlipidemia: Secondary | ICD-10-CM | POA: Diagnosis not present

## 2022-11-20 DIAGNOSIS — Z79899 Other long term (current) drug therapy: Secondary | ICD-10-CM

## 2022-11-20 DIAGNOSIS — I2510 Atherosclerotic heart disease of native coronary artery without angina pectoris: Secondary | ICD-10-CM | POA: Diagnosis not present

## 2022-11-20 NOTE — Progress Notes (Signed)
Cardiology Office Note   Date:  11/20/2022   ID:  GEARALD STONEBRAKER, DOB 1935/04/20, MRN [id number]  PCP:  Cassandria Anger, MD  Cardiologist:   [name], MD   F/u of CAD and atrial fibrillaiton        History of Present Illness: [name] is a 88 y.o. male with a history ofCAD, (s/p CABG 01/2011 LIMA to LAD; SVG to Cx),diabetes, hypertension, nephrolithiasis, hyperlipidemia, and atrial fibrillaiton  He underwent cardioversion in past that [place] and then had  amiodarone load.   Converted   Went back into atrial fib   .. .  Echo in Sept 2019 LVEF est at 40 to 45%   By my review it appeared greater   Myovue in November 2019 was normal   The pt had near syncope in June 2020  Monitor showed no arrhythmias  Symptoms improved/resolved   Myoview in May 2022 showed inferior defect consistent with scar and mild periinfarct ischemia   LVEF 40%   Echo though showed LVEF 60 to 65% with normal wall motion   COnsistent with soft tissue attenuation and possible subendocardial scar  I saw the pt in March 2023   Backed down on amiodarone to 5 days per week  Since seen he denies CP   Breathing is stable   Activity is very limited by DJD    No palpitations     Current Meds  Medication Sig   Accu-Chek Softclix Lancets lancets Use to check blood sugar daily DX:E11.59   amiodarone (PACERONE) 200 MG tablet TAKE 1 TABLET (200 MG TOTAL) BY MOUTH DAILY. EXCEPT SATURDAY AND SUNDAY.   apixaban (ELIQUIS) 5 MG TABS tablet TAKE 1 TABLET BY MOUTH TWICE A DAY   bisacodyl (DULCOLAX) 5 MG EC tablet Take 5 mg by mouth daily as needed for moderate constipation.   Blood Glucose Monitoring Suppl (ACCU-CHEK AVIVA PLUS) w/Device KIT Use to check blood sugar daily DX:E11.59   cholecalciferol (VITAMIN D) 1000 units tablet Take 2,000 Units by mouth daily.   docusate sodium (COLACE) 100 MG capsule Take 200 mg by mouth at bedtime.   ferrous sulfate 325 (65 FE) MG tablet Take 1 tablet (325 mg total) by mouth 2  (two) times daily with a meal.   furosemide (LASIX) 40 MG tablet Take 2 tablets (80 mg) by mouth every other day alternating with 1 1/ tablet (60 mg) on the opposite days.   gabapentin (NEURONTIN) 100 MG capsule TAKE 1 CAPSULE (100 MG TOTAL) BY MOUTH THREE TIMES DAILY.   glucose blood (ACCU-CHEK AVIVA PLUS) test strip Use to check blood sugar daily DX:E11.59   metFORMIN (GLUCOPHAGE) 500 MG tablet TAKE 1 TABLET BY MOUTH THREE TIMES A DAY   metolazone (ZAROXOLYN) 2.5 MG tablet Take one tablet by mouth 30 minutes prior to taking your lasix, once every two weeks.   pantoprazole (PROTONIX) 40 MG tablet TAKE 1 TABLET BY MOUTH EVERY DAY   potassium chloride SA (KLOR-CON M) 20 MEQ tablet Take 1 tablet (20 mEq total) by mouth daily.   repaglinide (PRANDIN) 2 MG tablet TAKE 1 TABLET BY MOUTH 3 TIMES DAILY BEFORE MEALS   simvastatin (ZOCOR) 10 MG tablet TAKE 1 TABLET BY MOUTH EVERY DAY   Tiotropium Bromide-Olodaterol (STIOLTO RESPIMAT) 2.5-2.5 MCG/ACT AERS Inhale 2 puffs into the lungs daily.   vitamin B-12 (CYANOCOBALAMIN) 250 MCG tablet Take 2 tablets (500 mcg total) by mouth daily.     Allergies:   Patient has no known allergies.  Past Medical History:  Diagnosis Date   Acute diastolic CHF (congestive heart failure) ([place]) 08/04/2020   Arthritis    Atrial fibrillation ([place])    post op; amiodarone and coumadin continued for 3 mos post op   CARCINOMA, SKIN, SQUAMOUS CELL 01/07/2010   Cataract    beginning of cataracts   COLONIC POLYPS, HX OF 04/26/2007   Coronary artery disease    s/p CABG 3/12: L-LAD, S-CFX (Dr. Roxy Manns); EF 45% at cath prior to CABG   DIABETES MELLITUS, TYPE II 04/26/2007   Hyperlipidemia    HYPERTENSION 04/26/2007   NEPHROLITHIASIS, HX OF 04/26/2007   Nuclear sclerotic cataract of right eye 10/02/2021   Surgery Dr. Lucianne Lei, 06-03-2022 with posterior capsule disruption posterior dislocation lens, disruption of pupil margin superiorly   SBO (small bowel obstruction) ([place]) 10/2015    Partial    Shortness of breath dyspnea    with exertion   Unspecified hearing loss 12/18/2008    Past Surgical History:  Procedure Laterality Date   BACK SURGERY     disectomy   CARDIOVERSION N/A 06/08/2018   Procedure: CARDIOVERSION;  Surgeon: Buford Dresser, MD;  Location: [facility];  Service: Cardiovascular;  Laterality: N/A;   CARDIOVERSION N/A 07/25/2018   Procedure: CARDIOVERSION;  Surgeon: Fay Records, MD;  Location: [facility];  Service: Cardiovascular;  Laterality: N/A;   COLONOSCOPY     CORONARY ARTERY BYPASS GRAFT  01/12/2011   LUMBAR LAMINECTOMY/DECOMPRESSION MICRODISCECTOMY N/A 05/08/2015   Procedure: LUMBAR LAMINECTOMY/DECOMPRESSION MICRODISCECTOMY 1 LEVEL Lumbar four five;  Surgeon: Melina Schools, MD;  Location: [facility];  Service: Orthopedics;  Laterality: N/A;   TOTAL HIP ARTHROPLASTY Left    left on 04/15/07   TYMPANIC MEMBRANE REPAIR  1981     Social History:  The patient  reports that he quit smoking about 41 years ago. His smoking use included cigarettes. He has a 60.00 pack-year smoking history. He has never used smokeless tobacco. He reports that he does not drink alcohol and does not use drugs.   Family History:  The patient's family history includes Breast cancer in his sister; Cancer in his brother and sister; Heart disease in his brother and father; Heart failure in his mother.    ROS:  Please see the history of present illness. All other systems are reviewed and  Negative to the above problem except as noted.    PHYSICAL EXAM: VS:  BP 124/76   Pulse 86   Ht _0  (1.727 m)   Wt 213 lb 6.4 oz (96.8 kg)   SpO2 98%   BMI 32.45 kg/m   GEN:  obese 88 yo in no acute distress  HEENT: normal  Neck: JVP is normal   Cardiac:  Regular rate and rhytm; no murmur  No LE edema   Respiratory:  clear to auscultation  GI: soft, nontender, obese    Skin: warm and dry   Neuro:  Strength and sensation are intact Psych: euthymic mood, full affect  Echo    04/07/21  E/LatE' elevated but in setting of severe MAC diastolic parameters indeterminate . Left ventricular ejection fraction, by estimation, is 60 to 65%. The left ventricle has normal function. The left ventricle has no regional wall motion abnormalities. Left ventricular diastolic parameters are indeterminate. 2. Right ventricular systolic function is normal. The right ventricular size is normal. 3. Left atrial size was moderately dilated. 4. Right atrial size was mildly dilated. 5. The mitral valve is degenerative. Mild to moderate mitral valve regurgitation. No evidence  of mitral stenosis. Severe mitral annular calcification. 6. The aortic valve is tricuspid. There is mild calcification of the aortic valve. Aortic valve regurgitation is not visualized. Mild aortic valve sclerosis is present, with no evidence of aortic valve stenosis. 7. The inferior vena cava is normal in size with greater than 50% respiratory variability, suggesting right atrial pressure of 3 mmHg.  Myoview  04/03/21  Nuclear stress EF: 40%. The left ventricular ejection fraction is moderately decreased (30-44%). There was no ST segment deviation noted during stress. There is a small defect of severe severity present in the basal inferior, mid inferior and apical inferior location. The defect is partially reversible and is consistent with inferior infarct with peri infarct ischemia. This is a high risk study. Compared to study of 2019, there is a now an inferior defect worse on stress imaging and LVF appears down with focal inferoseptal and inferior wall motion abnormalities Consider 2D echo   Echo 9/21  Left ventricular ejection fraction, by estimation, is 55 to 60%. The left ventricle has normal function. The left ventricle has no regional wall motion abnormalities. Left ventricular diastolic parameters are consistent with Grade II diastolic dysfunction (pseudonormalization). Elevated left ventricular  end-diastolic pressure. 2. Right ventricular systolic function is normal. The right ventricular size is normal. Tricuspid regurgitation signal is inadequate for assessing PA pressure. 3. Left atrial size was mildly dilated. 4. The mitral valve is normal in structure. Trivial mitral valve regurgitation. No evidence of mitral stenosis. Moderate mitral annular calcification. 5. The aortic valve was not well visualized. Aortic valve regurgitation is not visualized. Mild aortic valve sclerosis is present, with no evidence of aortic valve stenosis. 6. The inferior vena cava is normal in size with greater than 50% respiratory variability, suggesting right atrial pressure of 3 mmHg.  EKG:  EKG Is not ordered today  Lipid Panel    Component Value Date/Time   CHOL 114 01/14/2022 1641   TRIG 120 01/14/2022 1641   TRIG 78 11/05/2006 1123   HDL 38 (L) 01/14/2022 1641   CHOLHDL 3.0 01/14/2022 1641   CHOLHDL 2.5 01/17/2016 1209   VLDL 20 01/17/2016 1209   LDLCALC 54 01/14/2022 1641      Wt Readings from Last 3 Encounters:  11/20/22 213 lb 6.4 oz (96.8 kg)  10/13/22 221 lb (100.2 kg)  06/01/22 218 lb (98.9 kg)      ASSESSMENT AND PLAN:  1  CAD   CABG in 2012   Last myoview in 2022 showed scar with mild periinfarct ischemia   Echo shows LVEF normal    Pt denies CP   Follow     2   PAF     Remains in SR clinically  On Eliquis and amiodarone    Check labs    3   HTN   BP is controlled   5  HL Continue simvastatin  LDL 54      6 Hx of d izziness  Pt denies   7  Morbid obesity  I have reviewed diet    Follow-up later this year   Current medicines are reviewed at length with the patient today.  The patient does not have concerns regarding medicines.  Signed, [name], MD  11/20/2022 10:20 AM    [facility] [address], [place], [place]  [address] Phone: [phone]; Fax: [phone]

## 2022-11-20 NOTE — Patient Instructions (Signed)
Medication Instructions:   *If you need a refill on your cardiac medications before your next appointment, please call your pharmacy*   Lab Work: LIPID AND VIT D If you have labs (blood work) drawn today and your tests are completely normal, you will receive your results only by: [place] (if you have MyChart) OR A paper copy in the mail If you have any lab test that is abnormal or we need to change your treatment, we will call you to review the results.   Testing/Procedures:    Follow-Up: At [facility], you and your health needs are our priority.  As part of our continuing mission to provide you with exceptional heart care, we have created designated Provider Care Teams.  These Care Teams include your primary Cardiologist (physician) and Advanced Practice Providers (APPs -  Physician Assistants and Nurse Practitioners) who all work together to provide you with the care you need, when you need it.  We recommend signing up for the patient portal called "MyChart".  Sign up information is provided on this After Visit Summary.  MyChart is used to connect with patients for Virtual Visits (Telemedicine).  Patients are able to view lab/test results, encounter notes, upcoming appointments, etc.  Non-urgent messages can be sent to your provider as well.   To learn more about what you can do with MyChart, go to [email or web address].    Your next appointment:   8 month(s)  The format for your next appointment:   In Person  Provider:   [name], MD     Other Instructions   Important Information About Sugar

## 2022-11-21 LAB — LIPID PANEL
Chol/HDL Ratio: 2.1 ratio (ref 0.0–5.0)
Cholesterol, Total: 105 mg/dL (ref 100–199)
HDL: 49 mg/dL (ref >39)
LDL Chol Calc (NIH): 36 mg/dL (ref 0–99)
Triglycerides: 108 mg/dL (ref 0–149)
VLDL Cholesterol Cal: 20 mg/dL (ref 5–40)

## 2022-11-21 LAB — VITAMIN D 25 HYDROXY (VIT D DEFICIENCY, FRACTURES): Vit D, 25-Hydroxy: 21.6 ng/mL — ABNORMAL LOW (ref 30.0–100.0)

## 2022-11-23 ENCOUNTER — Telehealth: Payer: Self-pay

## 2022-11-23 MED ORDER — VITAMIN D (ERGOCALCIFEROL) 1.25 MG (50000 UNIT) PO CAPS: 50000.0000 [IU] | ORAL_CAPSULE | ORAL | 12 refills | Status: DC

## 2022-11-23 NOTE — Telephone Encounter (Signed)
The patient has been notified of the result and verbalized understanding.  All questions (if any) were answered.     

## 2022-11-23 NOTE — Telephone Encounter (Signed)
-----   Message from [place], MD sent at 11/21/2022 11:04 PM EST ----- Lipids are excellent    Vit D is very low   REcomm 50,000 U weekly   Follow up Vit D in 4 months

## 2022-11-24 ENCOUNTER — Other Ambulatory Visit: Payer: Self-pay

## 2022-11-24 DIAGNOSIS — E559 Vitamin D deficiency, unspecified: Secondary | ICD-10-CM

## 2023-01-01 DIAGNOSIS — H43812 Vitreous degeneration, left eye: Secondary | ICD-10-CM | POA: Diagnosis not present

## 2023-01-01 DIAGNOSIS — H353211 Exudative age-related macular degeneration, right eye, with active choroidal neovascularization: Secondary | ICD-10-CM | POA: Diagnosis not present

## 2023-01-01 DIAGNOSIS — H353122 Nonexudative age-related macular degeneration, left eye, intermediate dry stage: Secondary | ICD-10-CM | POA: Diagnosis not present

## 2023-01-01 DIAGNOSIS — H35372 Puckering of macula, left eye: Secondary | ICD-10-CM | POA: Diagnosis not present

## 2023-01-01 DIAGNOSIS — H59811 Chorioretinal scars after surgery for detachment, right eye: Secondary | ICD-10-CM | POA: Diagnosis not present

## 2023-01-13 ENCOUNTER — Ambulatory Visit (INDEPENDENT_AMBULATORY_CARE_PROVIDER_SITE_OTHER): Payer: Medicare Other | Admitting: Internal Medicine

## 2023-01-13 ENCOUNTER — Encounter: Payer: Self-pay | Admitting: Internal Medicine

## 2023-01-13 VITALS — BP 124/78 | HR 66 | Temp 98.2°F | Ht 68.0 in | Wt 213.0 lb

## 2023-01-13 DIAGNOSIS — I480 Paroxysmal atrial fibrillation: Secondary | ICD-10-CM | POA: Diagnosis not present

## 2023-01-13 DIAGNOSIS — N183 Chronic kidney disease, stage 3 unspecified: Secondary | ICD-10-CM

## 2023-01-13 DIAGNOSIS — I10 Essential (primary) hypertension: Secondary | ICD-10-CM | POA: Diagnosis not present

## 2023-01-13 DIAGNOSIS — E559 Vitamin D deficiency, unspecified: Secondary | ICD-10-CM | POA: Diagnosis not present

## 2023-01-13 DIAGNOSIS — E538 Deficiency of other specified B group vitamins: Secondary | ICD-10-CM | POA: Diagnosis not present

## 2023-01-13 DIAGNOSIS — M47816 Spondylosis without myelopathy or radiculopathy, lumbar region: Secondary | ICD-10-CM | POA: Diagnosis not present

## 2023-01-13 DIAGNOSIS — M545 Low back pain, unspecified: Secondary | ICD-10-CM | POA: Diagnosis not present

## 2023-01-13 DIAGNOSIS — E1159 Type 2 diabetes mellitus with other circulatory complications: Secondary | ICD-10-CM

## 2023-01-13 DIAGNOSIS — G8929 Other chronic pain: Secondary | ICD-10-CM

## 2023-01-13 LAB — COMPREHENSIVE METABOLIC PANEL
ALT: 18 U/L (ref 0–53)
AST: 24 U/L (ref 0–37)
Albumin: 4 g/dL (ref 3.5–5.2)
Alkaline Phosphatase: 51 U/L (ref 39–117)
BUN: 31 mg/dL — ABNORMAL HIGH (ref 6–23)
CO2: 29 mEq/L (ref 19–32)
Calcium: 9.6 mg/dL (ref 8.4–10.5)
Chloride: 102 mEq/L (ref 96–112)
Creatinine, Ser: 1.46 mg/dL (ref 0.40–1.50)
GFR: 43.44 mL/min — ABNORMAL LOW (ref >60.00)
Glucose, Bld: 119 mg/dL — ABNORMAL HIGH (ref 70–99)
Potassium: 4 mEq/L (ref 3.5–5.1)
Sodium: 140 mEq/L (ref 135–145)
Total Bilirubin: 0.4 mg/dL (ref 0.2–1.2)
Total Protein: 7.2 g/dL (ref 6.0–8.3)

## 2023-01-13 LAB — HEMOGLOBIN A1C: Hgb A1c MFr Bld: 6.4 % (ref 4.6–6.5)

## 2023-01-13 NOTE — Assessment & Plan Note (Signed)
Using 600 mg of Ibuprofen prn bid. Pt was advised Tylenol instead. Monitor GFR. GFR is 42

## 2023-01-13 NOTE — Assessment & Plan Note (Signed)
Start using a walker, cane

## 2023-01-13 NOTE — Assessment & Plan Note (Signed)
Re-start Vit D Rx

## 2023-01-13 NOTE — Progress Notes (Signed)
Subjective:  Patient ID: [name], male    DOB: December 15, 1934  Age: 88 y.o. MRN: [id number]  CC: Follow-up (Trouble with not getting some sleep some nights )   HPI [name] presents for CAD, anticoagulation, CAD f/u  Outpatient Medications Prior to Visit  Medication Sig Dispense Refill   Accu-Chek Softclix Lancets lancets Use to check blood sugar daily DX:E11.59 100 each 3   albuterol (VENTOLIN HFA) 108 (90 Base) MCG/ACT inhaler TAKE 2 PUFFS BY MOUTH EVERY 6 HOURS AS NEEDED FOR WHEEZE OR SHORTNESS OF BREATH 6.7 each 2   amiodarone (PACERONE) 200 MG tablet TAKE 1 TABLET (200 MG TOTAL) BY MOUTH DAILY. EXCEPT SATURDAY AND SUNDAY. 90 tablet 3   apixaban (ELIQUIS) 5 MG TABS tablet TAKE 1 TABLET BY MOUTH TWICE A DAY 60 tablet 10   bisacodyl (DULCOLAX) 5 MG EC tablet Take 5 mg by mouth daily as needed for moderate constipation.     Blood Glucose Monitoring Suppl (ACCU-CHEK AVIVA PLUS) w/Device KIT Use to check blood sugar daily DX:E11.59 1 kit 0   cholecalciferol (VITAMIN D) 1000 units tablet Take 2,000 Units by mouth daily.     docusate sodium (COLACE) 100 MG capsule Take 200 mg by mouth at bedtime.     ferrous sulfate 325 (65 FE) MG tablet Take 1 tablet (325 mg total) by mouth 2 (two) times daily with a meal. 60 tablet 6   furosemide (LASIX) 40 MG tablet Take 2 tablets (80 mg) by mouth every other day alternating with 1 1/ tablet (60 mg) on the opposite days. 180 tablet 3   gabapentin (NEURONTIN) 100 MG capsule TAKE 1 CAPSULE (100 MG TOTAL) BY MOUTH THREE TIMES DAILY. 270 capsule 3   glucose blood (ACCU-CHEK AVIVA PLUS) test strip Use to check blood sugar daily DX:E11.59 100 each 3   latanoprost (XALATAN) 0.005 % ophthalmic solution Place 1 drop into both eyes daily.     metFORMIN (GLUCOPHAGE) 500 MG tablet TAKE 1 TABLET BY MOUTH THREE TIMES A DAY 270 tablet 3   metolazone (ZAROXOLYN) 2.5 MG tablet Take one tablet by mouth 30 minutes prior to taking your lasix, once every two  weeks. 30 tablet 3   pantoprazole (PROTONIX) 40 MG tablet TAKE 1 TABLET BY MOUTH EVERY DAY 90 tablet 3   potassium chloride SA (KLOR-CON M) 20 MEQ tablet Take 1 tablet (20 mEq total) by mouth daily. 90 tablet 3   Propylene Glycol 0.6 % SOLN Place 1 drop into the left eye 2 (two) times daily as needed (for dry eyes).     repaglinide (PRANDIN) 2 MG tablet TAKE 1 TABLET BY MOUTH 3 TIMES DAILY BEFORE MEALS 270 tablet 3   simvastatin (ZOCOR) 10 MG tablet TAKE 1 TABLET BY MOUTH EVERY DAY 90 tablet 3   Tiotropium Bromide-Olodaterol (STIOLTO RESPIMAT) 2.5-2.5 MCG/ACT AERS Inhale 2 puffs into the lungs daily. 4 g 0   vitamin B-12 (CYANOCOBALAMIN) 250 MCG tablet Take 2 tablets (500 mcg total) by mouth daily.     Vitamin D, Ergocalciferol, (DRISDOL) 1.25 MG (50000 UNIT) CAPS capsule Take 1 capsule (50,000 Units total) by mouth every 7 (seven) days. 5 capsule 12   No facility-administered medications prior to visit.    ROS: Review of Systems  Constitutional:  Negative for appetite change, fatigue and unexpected weight change.  HENT:  Negative for congestion, nosebleeds, sneezing, sore throat and trouble swallowing.   Eyes:  Negative for itching and visual disturbance.  Respiratory:  Negative for cough.  Cardiovascular:  Negative for chest pain, palpitations and leg swelling.  Gastrointestinal:  Negative for abdominal distention, blood in stool, diarrhea and nausea.  Genitourinary:  Negative for frequency and hematuria.  Musculoskeletal:  Positive for arthralgias, back pain and gait problem. Negative for joint swelling and neck pain.  Skin:  Negative for rash.  Neurological:  Positive for weakness. Negative for dizziness, tremors and speech difficulty.  Psychiatric/Behavioral:  Negative for agitation, dysphoric mood, sleep disturbance and suicidal ideas. The patient is not nervous/anxious.     Objective:  BP 124/78 (BP Location: Left Arm, Patient Position: Sitting, Cuff Size: Normal)   Pulse 66    Temp 98.2 F (36.8 C) (Oral)   Ht '5\' 8"'$  (1.727 m)   Wt 213 lb (96.6 kg)   SpO2 96%   BMI 32.39 kg/m   BP Readings from Last 3 Encounters:  01/13/23 124/78  11/20/22 124/76  10/13/22 122/70    Wt Readings from Last 3 Encounters:  01/13/23 213 lb (96.6 kg)  11/20/22 213 lb 6.4 oz (96.8 kg)  10/13/22 221 lb (100.2 kg)    Physical Exam Constitutional:      General: He is not in acute distress.    Appearance: He is well-developed. He is obese.     Comments: NAD  Eyes:     Conjunctiva/sclera: Conjunctivae normal.     Pupils: Pupils are equal, round, and reactive to light.  Neck:     Thyroid: No thyromegaly.     Vascular: No JVD.  Cardiovascular:     Rate and Rhythm: Normal rate and regular rhythm.     Heart sounds: Normal heart sounds. No murmur heard.    No friction rub. No gallop.  Pulmonary:     Effort: Pulmonary effort is normal. No respiratory distress.     Breath sounds: Normal breath sounds. No wheezing or rales.  Chest:     Chest wall: No tenderness.  Abdominal:     General: Bowel sounds are normal. There is no distension.     Palpations: Abdomen is soft. There is no mass.     Tenderness: There is no abdominal tenderness. There is no guarding or rebound.  Musculoskeletal:        General: Tenderness present. Normal range of motion.     Cervical back: Normal range of motion.     Right lower leg: No edema.     Left lower leg: No edema.  Lymphadenopathy:     Cervical: No cervical adenopathy.  Skin:    General: Skin is warm and dry.     Findings: No rash.  Neurological:     Mental Status: He is alert and oriented to person, place, and time.     Cranial Nerves: No cranial nerve deficit.     Motor: Weakness present. No abnormal muscle tone.     Coordination: Coordination abnormal.     Gait: Gait abnormal.     Deep Tendon Reflexes: Reflexes are normal and symmetric.  Psychiatric:        Behavior: Behavior normal.        Thought Content: Thought content normal.         Judgment: Judgment normal.   Very stiff ataxic  Lab Results  Component Value Date   WBC 6.1 01/14/2022   HGB 13.5 01/14/2022   HCT 38.5 01/14/2022   PLT 227 01/14/2022   GLUCOSE 170 (H) 10/13/2022   CHOL 105 11/20/2022   TRIG 108 11/20/2022   HDL 49 11/20/2022   [place]  36 11/20/2022   ALT 19 10/13/2022   AST 24 10/13/2022   NA 141 10/13/2022   K 3.8 10/13/2022   CL 103 10/13/2022   CREATININE 1.49 10/13/2022   BUN 26 (H) 10/13/2022   CO2 27 10/13/2022   TSH 10.85 (H) 10/13/2022   PSA 1.18 07/02/2017   INR 2.3 03/03/2012   HGBA1C 6.7 (H) 10/13/2022   MICROALBUR 0.7 04/04/2018    No results found.  Assessment & Plan:   Problem List Items Addressed This Visit       Cardiovascular and Mediastinum   Paroxysmal atrial fibrillation (HCC) - Primary (Chronic)    F/u w/Dr. Harrington Challenger Cont on amiodarone, Eliquis, Paceron, Toprol      Relevant Orders   Comprehensive metabolic panel   Hemoglobin A1c   Essential hypertension    Continue on Toprol, Furosemide        Endocrine   Diabetes mellitus, type 2 (HCC) (Chronic)   Relevant Orders   Comprehensive metabolic panel   Hemoglobin A1c     Genitourinary   CRI (chronic renal insufficiency), stage 3 (moderate) (HCC)    Using 600 mg of Ibuprofen prn bid. Pt was advised Tylenol instead. Monitor GFR. GFR is 42        Other   Vitamin D deficiency    Re-start Vit D Rx      Back pain    Using 600 mg of Ibuprofen prn bid. Pt was advised Tylenol instead. Monitor GFR. GFR is 42      B12 deficiency    On B12         No orders of the defined types were placed in this encounter.     Follow-up: Return in about 3 months (around 04/13/2023) for a follow-up visit.  Walker Kehr, MD

## 2023-01-13 NOTE — Assessment & Plan Note (Signed)
On B12 

## 2023-01-13 NOTE — Assessment & Plan Note (Signed)
F/u w/Dr. Harrington Challenger Cont on amiodarone, Eliquis, Paceron, Toprol

## 2023-01-13 NOTE — Assessment & Plan Note (Signed)
Continue on Toprol, Furosemide

## 2023-01-16 ENCOUNTER — Other Ambulatory Visit: Payer: Self-pay | Admitting: Internal Medicine

## 2023-01-20 DIAGNOSIS — M25511 Pain in right shoulder: Secondary | ICD-10-CM | POA: Diagnosis not present

## 2023-01-20 DIAGNOSIS — M25512 Pain in left shoulder: Secondary | ICD-10-CM | POA: Diagnosis not present

## 2023-01-25 ENCOUNTER — Other Ambulatory Visit: Payer: Self-pay | Admitting: Internal Medicine

## 2023-03-12 ENCOUNTER — Ambulatory Visit: Payer: Medicare Other | Attending: Internal Medicine

## 2023-03-12 DIAGNOSIS — E559 Vitamin D deficiency, unspecified: Secondary | ICD-10-CM | POA: Diagnosis not present

## 2023-03-13 LAB — VITAMIN D 25 HYDROXY (VIT D DEFICIENCY, FRACTURES): Vit D, 25-Hydroxy: 61.6 ng/mL (ref 30.0–100.0)

## 2023-03-19 ENCOUNTER — Other Ambulatory Visit: Payer: Self-pay | Admitting: Internal Medicine

## 2023-03-29 ENCOUNTER — Other Ambulatory Visit (HOSPITAL_COMMUNITY): Payer: Self-pay | Admitting: Internal Medicine

## 2023-03-29 DIAGNOSIS — I480 Paroxysmal atrial fibrillation: Secondary | ICD-10-CM

## 2023-03-29 NOTE — Telephone Encounter (Signed)
Eliquis 5mg  refill request received. Patient is 88 years old, weight-96.6kg, Crea-1.46 on 01/13/23, Diagnosis-Afib, and last seen by Dr. Tenny Craw on 11/20/22. Dose is appropriate based on dosing criteria. Will send in refill to requested pharmacy.

## 2023-04-21 ENCOUNTER — Encounter: Payer: Self-pay | Admitting: Internal Medicine

## 2023-04-21 ENCOUNTER — Ambulatory Visit (INDEPENDENT_AMBULATORY_CARE_PROVIDER_SITE_OTHER): Payer: Medicare Other | Admitting: Internal Medicine

## 2023-04-21 VITALS — BP 118/70 | HR 53 | Temp 98.4°F | Ht 68.0 in | Wt 213.0 lb

## 2023-04-21 DIAGNOSIS — R269 Unspecified abnormalities of gait and mobility: Secondary | ICD-10-CM | POA: Diagnosis not present

## 2023-04-21 DIAGNOSIS — I152 Hypertension secondary to endocrine disorders: Secondary | ICD-10-CM | POA: Diagnosis not present

## 2023-04-21 DIAGNOSIS — Z7984 Long term (current) use of oral hypoglycemic drugs: Secondary | ICD-10-CM | POA: Diagnosis not present

## 2023-04-21 DIAGNOSIS — I480 Paroxysmal atrial fibrillation: Secondary | ICD-10-CM | POA: Diagnosis not present

## 2023-04-21 DIAGNOSIS — E1159 Type 2 diabetes mellitus with other circulatory complications: Secondary | ICD-10-CM | POA: Diagnosis not present

## 2023-04-21 DIAGNOSIS — E538 Deficiency of other specified B group vitamins: Secondary | ICD-10-CM | POA: Diagnosis not present

## 2023-04-21 LAB — CBC WITH DIFFERENTIAL/PLATELET
Basophils Absolute: 0.1 10*3/uL (ref 0.0–0.1)
Basophils Relative: 0.9 % (ref 0.0–3.0)
Eosinophils Absolute: 0.2 10*3/uL (ref 0.0–0.7)
Eosinophils Relative: 3.5 % (ref 0.0–5.0)
HCT: 40.1 % (ref 39.0–52.0)
Hemoglobin: 13.5 g/dL (ref 13.0–17.0)
Lymphocytes Relative: 24.1 % (ref 12.0–46.0)
Lymphs Abs: 1.7 10*3/uL (ref 0.7–4.0)
MCHC: 33.7 g/dL (ref 30.0–36.0)
MCV: 94.7 fl (ref 78.0–100.0)
Monocytes Absolute: 0.7 10*3/uL (ref 0.1–1.0)
Monocytes Relative: 9.6 % (ref 3.0–12.0)
Neutro Abs: 4.3 10*3/uL (ref 1.4–7.7)
Neutrophils Relative %: 61.9 % (ref 43.0–77.0)
Platelets: 273 10*3/uL (ref 150.0–400.0)
RBC: 4.24 Mil/uL (ref 4.22–5.81)
RDW: 13.4 % (ref 11.5–15.5)
WBC: 6.9 10*3/uL (ref 4.0–10.5)

## 2023-04-21 LAB — HEMOGLOBIN A1C: Hgb A1c MFr Bld: 6.2 % (ref 4.6–6.5)

## 2023-04-21 NOTE — Progress Notes (Signed)
Subjective:  Patient ID: [name], male    DOB: 23-Mar-1935  Age: 88 y.o. MRN: [id number]  CC: Follow-up (3 MNTH F/U)   HPI [name] presents for PAF, HTN, DM, gait disorder  Outpatient Medications Prior to Visit  Medication Sig Dispense Refill   Accu-Chek Softclix Lancets lancets Use to check blood sugar daily DX:E11.59 100 each 3   albuterol (VENTOLIN HFA) 108 (90 Base) MCG/ACT inhaler TAKE 2 PUFFS BY MOUTH EVERY 6 HOURS AS NEEDED FOR WHEEZE OR SHORTNESS OF BREATH 6.7 each 2   amiodarone (PACERONE) 200 MG tablet TAKE 1 TABLET (200 MG TOTAL) BY MOUTH DAILY. EXCEPT SATURDAY AND SUNDAY. 90 tablet 3   apixaban (ELIQUIS) 5 MG TABS tablet TAKE 1 TABLET BY MOUTH TWICE A DAY 60 tablet 6   bisacodyl (DULCOLAX) 5 MG EC tablet Take 5 mg by mouth daily as needed for moderate constipation.     Blood Glucose Monitoring Suppl (ACCU-CHEK AVIVA PLUS) w/Device KIT Use to check blood sugar daily DX:E11.59 1 kit 0   cholecalciferol (VITAMIN D) 1000 units tablet Take 2,000 Units by mouth daily.     docusate sodium (COLACE) 100 MG capsule Take 200 mg by mouth at bedtime.     ferrous sulfate 325 (65 FE) MG tablet Take 1 tablet (325 mg total) by mouth 2 (two) times daily with a meal. 60 tablet 6   furosemide (LASIX) 40 MG tablet Take 2 tablets (80 mg) by mouth every other day alternating with 1 1/ tablet (60 mg) on the opposite days. 180 tablet 3   gabapentin (NEURONTIN) 100 MG capsule TAKE 1 CAPSULE (100 MG TOTAL) BY MOUTH THREE TIMES DAILY. 270 capsule 3   glucose blood (ACCU-CHEK AVIVA PLUS) test strip Use to check blood sugar daily DX:E11.59 100 each 3   KLOR-CON M20 20 MEQ tablet TAKE 1 TABLET BY MOUTH EVERY DAY 90 tablet 3   latanoprost (XALATAN) 0.005 % ophthalmic solution Place 1 drop into both eyes daily.     metFORMIN (GLUCOPHAGE) 500 MG tablet TAKE 1 TABLET BY MOUTH THREE TIMES A DAY 270 tablet 3   metolazone (ZAROXOLYN) 2.5 MG tablet TAKE ONE TABLET 30 MINUTES PRIOR TO TAKING  YOUR LASIX, once every two weeks 30 tablet 3   pantoprazole (PROTONIX) 40 MG tablet TAKE 1 TABLET BY MOUTH EVERY DAY 90 tablet 3   Propylene Glycol 0.6 % SOLN Place 1 drop into the left eye 2 (two) times daily as needed (for dry eyes).     repaglinide (PRANDIN) 2 MG tablet TAKE 1 TABLET BY MOUTH 3 TIMES DAILY BEFORE MEALS 270 tablet 3   simvastatin (ZOCOR) 10 MG tablet TAKE 1 TABLET BY MOUTH EVERY DAY 90 tablet 3   Tiotropium Bromide-Olodaterol (STIOLTO RESPIMAT) 2.5-2.5 MCG/ACT AERS Inhale 2 puffs into the lungs daily. 4 g 0   vitamin B-12 (CYANOCOBALAMIN) 250 MCG tablet Take 2 tablets (500 mcg total) by mouth daily.     Vitamin D, Ergocalciferol, (DRISDOL) 1.25 MG (50000 UNIT) CAPS capsule Take 1 capsule (50,000 Units total) by mouth every 7 (seven) days. 5 capsule 12   No facility-administered medications prior to visit.    ROS: Review of Systems  Constitutional:  Positive for fatigue. Negative for appetite change and unexpected weight change.  HENT:  Negative for congestion, nosebleeds, sneezing, sore throat and trouble swallowing.   Eyes:  Negative for itching and visual disturbance.  Respiratory:  Negative for cough.   Cardiovascular:  Negative for chest pain, palpitations and  leg swelling.  Gastrointestinal:  Negative for abdominal distention, blood in stool, diarrhea and nausea.  Genitourinary:  Negative for frequency and hematuria.  Musculoskeletal:  Positive for arthralgias, back pain and gait problem. Negative for joint swelling and neck pain.  Skin:  Negative for rash.  Neurological:  Negative for dizziness, tremors, speech difficulty and weakness.  Psychiatric/Behavioral:  Negative for agitation, dysphoric mood, sleep disturbance and suicidal ideas. The patient is not nervous/anxious.     Objective:  BP 118/70 (BP Location: Left Arm, Patient Position: Sitting, Cuff Size: Normal)   Pulse (!) 53   Temp 98.4 F (36.9 C) (Oral)   Ht 5\' 8"  (1.727 m)   Wt 213 lb (96.6 kg)    SpO2 93%   BMI 32.39 kg/m   BP Readings from Last 3 Encounters:  04/21/23 118/70  01/13/23 124/78  11/20/22 124/76    Wt Readings from Last 3 Encounters:  04/21/23 213 lb (96.6 kg)  01/13/23 213 lb (96.6 kg)  11/20/22 213 lb 6.4 oz (96.8 kg)    Physical Exam Constitutional:      General: He is not in acute distress.    Appearance: He is well-developed. He is obese.     Comments: NAD  Eyes:     Conjunctiva/sclera: Conjunctivae normal.     Pupils: Pupils are equal, round, and reactive to light.  Neck:     Thyroid: No thyromegaly.     Vascular: No JVD.  Cardiovascular:     Rate and Rhythm: Normal rate and regular rhythm.     Heart sounds: Normal heart sounds. No murmur heard.    No friction rub. No gallop.  Pulmonary:     Effort: Pulmonary effort is normal. No respiratory distress.     Breath sounds: Normal breath sounds. No wheezing or rales.  Chest:     Chest wall: No tenderness.  Abdominal:     General: Bowel sounds are normal. There is no distension.     Palpations: Abdomen is soft. There is no mass.     Tenderness: There is no abdominal tenderness. There is no guarding or rebound.  Musculoskeletal:        General: Tenderness present. Normal range of motion.     Cervical back: Normal range of motion.  Lymphadenopathy:     Cervical: No cervical adenopathy.  Skin:    General: Skin is warm and dry.     Findings: No rash.  Neurological:     Mental Status: He is alert and oriented to person, place, and time.     Cranial Nerves: No cranial nerve deficit.     Motor: No abnormal muscle tone.     Coordination: Coordination abnormal.     Gait: Gait abnormal.     Deep Tendon Reflexes: Reflexes are normal and symmetric.  Psychiatric:        Behavior: Behavior normal.        Thought Content: Thought content normal.        Judgment: Judgment normal.   Antalgic gait  Lab Results  Component Value Date   WBC 6.1 01/14/2022   HGB 13.5 01/14/2022   HCT 38.5 01/14/2022    PLT 227 01/14/2022   GLUCOSE 119 (H) 01/13/2023   CHOL 105 11/20/2022   TRIG 108 11/20/2022   HDL 49 11/20/2022   LDLCALC 36 11/20/2022   ALT 18 01/13/2023   AST 24 01/13/2023   NA 140 01/13/2023   K 4.0 01/13/2023   CL 102 01/13/2023   CREATININE  1.46 01/13/2023   BUN 31 (H) 01/13/2023   CO2 29 01/13/2023   TSH 10.85 (H) 10/13/2022   PSA 1.18 07/02/2017   INR 2.3 03/03/2012   HGBA1C 6.4 01/13/2023   MICROALBUR 0.7 04/04/2018    No results found.  Assessment & Plan:   Problem List Items Addressed This Visit     Diabetes mellitus, type 2 (HCC) - Primary (Chronic)    Cont on Metformin, Prandin Metformin 500 mg tid  BMET, A1c q 3 mo      Relevant Orders   CBC with Differential/Platelet   Comprehensive metabolic panel   Hemoglobin A1c   Hypertension associated with diabetes (HCC) (Chronic)    Cont on Metoprolol      Relevant Orders   Comprehensive metabolic panel   Paroxysmal atrial fibrillation (HCC) (Chronic)    F/u w/Dr. Tenny Craw Cont on amiodarone, Eliquis, Paceron, Toprol      B12 deficiency    On B12      Relevant Orders   CBC with Differential/Platelet   Gait disorder    Start using a rollator walker Fall prevention      Relevant Orders   Hemoglobin A1c      No orders of the defined types were placed in this encounter.     Follow-up: Return in about 3 months (around 07/22/2023) for a follow-up visit.  Sonda Primes, MD

## 2023-04-21 NOTE — Assessment & Plan Note (Signed)
Start using a rollator walker Fall prevention

## 2023-04-21 NOTE — Assessment & Plan Note (Signed)
Cont on Metoprolol 

## 2023-04-21 NOTE — Assessment & Plan Note (Signed)
On B12 

## 2023-04-21 NOTE — Assessment & Plan Note (Signed)
Cont on Metformin, Prandin Metformin 500 mg tid  BMET, A1c q 3 mo 

## 2023-04-21 NOTE — Assessment & Plan Note (Signed)
F/u w/Dr. Ross Cont on amiodarone, Eliquis, Paceron, Toprol 

## 2023-04-22 ENCOUNTER — Other Ambulatory Visit: Payer: Self-pay | Admitting: Internal Medicine

## 2023-04-22 LAB — COMPREHENSIVE METABOLIC PANEL
ALT: 13 U/L (ref 0–53)
AST: 21 U/L (ref 0–37)
Albumin: 4 g/dL (ref 3.5–5.2)
Alkaline Phosphatase: 49 U/L (ref 39–117)
BUN: 17 mg/dL (ref 6–23)
CO2: 18 mEq/L — ABNORMAL LOW (ref 19–32)
Calcium: 9.4 mg/dL (ref 8.4–10.5)
Chloride: 101 mEq/L (ref 96–112)
Creatinine, Ser: 1.6 mg/dL — ABNORMAL HIGH (ref 0.40–1.50)
GFR: 38.85 mL/min — ABNORMAL LOW (ref >60.00)
Glucose, Bld: 118 mg/dL — ABNORMAL HIGH (ref 70–99)
Potassium: 4 mEq/L (ref 3.5–5.1)
Sodium: 138 mEq/L (ref 135–145)
Total Bilirubin: 0.5 mg/dL (ref 0.2–1.2)
Total Protein: 7.2 g/dL (ref 6.0–8.3)

## 2023-06-02 ENCOUNTER — Other Ambulatory Visit: Payer: Self-pay | Admitting: Internal Medicine

## 2023-06-07 DIAGNOSIS — M25511 Pain in right shoulder: Secondary | ICD-10-CM | POA: Diagnosis not present

## 2023-06-07 DIAGNOSIS — M25512 Pain in left shoulder: Secondary | ICD-10-CM | POA: Diagnosis not present

## 2023-06-14 ENCOUNTER — Ambulatory Visit: Payer: Medicare Other | Admitting: Internal Medicine

## 2023-06-14 ENCOUNTER — Encounter: Payer: Self-pay | Admitting: Internal Medicine

## 2023-06-14 VITALS — BP 130/84 | HR 75 | Temp 98.0°F | Ht 68.0 in | Wt 211.0 lb

## 2023-06-14 DIAGNOSIS — I480 Paroxysmal atrial fibrillation: Secondary | ICD-10-CM

## 2023-06-14 DIAGNOSIS — N183 Chronic kidney disease, stage 3 unspecified: Secondary | ICD-10-CM

## 2023-06-14 DIAGNOSIS — I152 Hypertension secondary to endocrine disorders: Secondary | ICD-10-CM

## 2023-06-14 DIAGNOSIS — Z7984 Long term (current) use of oral hypoglycemic drugs: Secondary | ICD-10-CM | POA: Diagnosis not present

## 2023-06-14 DIAGNOSIS — R600 Localized edema: Secondary | ICD-10-CM

## 2023-06-14 DIAGNOSIS — E1159 Type 2 diabetes mellitus with other circulatory complications: Secondary | ICD-10-CM

## 2023-06-14 MED ORDER — METOLAZONE 2.5 MG PO TABS: ORAL_TABLET | ORAL | 3 refills | Status: DC

## 2023-06-14 NOTE — Assessment & Plan Note (Signed)
  Monitor GFR. GFR is 42

## 2023-06-14 NOTE — Assessment & Plan Note (Addendum)
Cont on Metoprolol  Cont w/Furosemide. Zaroxolyn - TAKE ONE TABLET 30 MINUTES PRIOR TO TAKING YOUR LASIX, once every week.

## 2023-06-14 NOTE — Assessment & Plan Note (Addendum)
Acute on chronic Worse, now better L>R Cont w/Furosemide. Zaroxolyn - TAKE ONE TABLET 30 MINUTES PRIOR TO TAKING YOUR LASIX, once every week. On Eliquis - doubt DVT Coban wrap Venous Doppler if not better Will not get D dimer due to low GFR No CHF symptoms now

## 2023-06-14 NOTE — Assessment & Plan Note (Signed)
F/u w/Dr. Tenny Craw Cont on amiodarone, Eliquis, Toprol

## 2023-06-14 NOTE — Assessment & Plan Note (Signed)
Cont on Metformin, Prandin Metformin 500 mg tid  BMET, A1c q 3 mo

## 2023-06-14 NOTE — Progress Notes (Signed)
Subjective:  Patient ID: [name], male    DOB: 1935/08/13  Age: 88 y.o. MRN: [id number]  CC: Edema (B/L LEG EDEMA and soreness)   HPI [name] presents for LLE edema x 6 weeks, tight and warm, no redness. Better now by 30% On Eliquis  Outpatient Medications Prior to Visit  Medication Sig Dispense Refill   Accu-Chek Softclix Lancets lancets Use to check blood sugar daily DX:E11.59 100 each 3   albuterol (VENTOLIN HFA) 108 (90 Base) MCG/ACT inhaler TAKE 2 PUFFS BY MOUTH EVERY 6 HOURS AS NEEDED FOR WHEEZE OR SHORTNESS OF BREATH 6.7 each 2   amiodarone (PACERONE) 200 MG tablet TAKE 1 TABLET (200 MG TOTAL) BY MOUTH DAILY. EXCEPT SATURDAY AND SUNDAY. 90 tablet 3   apixaban (ELIQUIS) 5 MG TABS tablet TAKE 1 TABLET BY MOUTH TWICE A DAY 60 tablet 6   bisacodyl (DULCOLAX) 5 MG EC tablet Take 5 mg by mouth daily as needed for moderate constipation.     Blood Glucose Monitoring Suppl (ACCU-CHEK AVIVA PLUS) w/Device KIT Use to check blood sugar daily DX:E11.59 1 kit 0   cholecalciferol (VITAMIN D) 1000 units tablet Take 2,000 Units by mouth daily.     docusate sodium (COLACE) 100 MG capsule Take 200 mg by mouth at bedtime.     ferrous sulfate 325 (65 FE) MG tablet Take 1 tablet (325 mg total) by mouth 2 (two) times daily with a meal. 60 tablet 6   furosemide (LASIX) 40 MG tablet TAKE 2 TABLETS (80 MG TOTAL) BY MOUTH DAILY. CAN TAKE AN EXTRA 1/2 TABLET BY MOUTH ONCE A WEEK. 180 tablet 1   gabapentin (NEURONTIN) 100 MG capsule TAKE 1 CAPSULE (100 MG TOTAL) BY MOUTH 3 TIMES A DAY 270 capsule 3   glucose blood (ACCU-CHEK AVIVA PLUS) test strip Use to check blood sugar daily DX:E11.59 100 each 3   KLOR-CON M20 20 MEQ tablet TAKE 1 TABLET BY MOUTH EVERY DAY 90 tablet 3   latanoprost (XALATAN) 0.005 % ophthalmic solution Place 1 drop into both eyes daily.     metFORMIN (GLUCOPHAGE) 500 MG tablet TAKE 1 TABLET BY MOUTH THREE TIMES A DAY 270 tablet 3   pantoprazole (PROTONIX) 40 MG  tablet TAKE 1 TABLET BY MOUTH EVERY DAY 90 tablet 3   Propylene Glycol 0.6 % SOLN Place 1 drop into the left eye 2 (two) times daily as needed (for dry eyes).     repaglinide (PRANDIN) 2 MG tablet TAKE 1 TABLET BY MOUTH 3 TIMES DAILY BEFORE MEALS 270 tablet 3   simvastatin (ZOCOR) 10 MG tablet TAKE 1 TABLET BY MOUTH EVERY DAY 90 tablet 3   Tiotropium Bromide-Olodaterol (STIOLTO RESPIMAT) 2.5-2.5 MCG/ACT AERS Inhale 2 puffs into the lungs daily. 4 g 0   vitamin B-12 (CYANOCOBALAMIN) 250 MCG tablet Take 2 tablets (500 mcg total) by mouth daily.     metolazone (ZAROXOLYN) 2.5 MG tablet TAKE ONE TABLET 30 MINUTES PRIOR TO TAKING YOUR LASIX, once every two weeks 30 tablet 3   Vitamin D, Ergocalciferol, (DRISDOL) 1.25 MG (50000 UNIT) CAPS capsule Take 1 capsule (50,000 Units total) by mouth every 7 (seven) days. 5 capsule 12   No facility-administered medications prior to visit.    ROS: Review of Systems  Constitutional:  Negative for appetite change, fatigue and unexpected weight change.  HENT:  Negative for congestion, nosebleeds, sneezing, sore throat and trouble swallowing.   Eyes:  Negative for itching and visual disturbance.  Respiratory:  Negative for  cough.   Cardiovascular:  Positive for leg swelling. Negative for chest pain and palpitations.  Gastrointestinal:  Negative for abdominal distention, blood in stool, diarrhea and nausea.  Genitourinary:  Negative for frequency and hematuria.  Musculoskeletal:  Positive for arthralgias, back pain and gait problem. Negative for joint swelling and neck pain.  Skin:  Negative for rash.  Neurological:  Negative for dizziness, tremors, speech difficulty and weakness.  Psychiatric/Behavioral:  Negative for agitation, dysphoric mood and sleep disturbance. The patient is not nervous/anxious.     Objective:  BP 130/84 (BP Location: Left Arm, Patient Position: Sitting, Cuff Size: Large)   Pulse 75   Temp 98 F (36.7 C) (Oral)   Ht 5\' 8"  (1.727 m)    Wt 211 lb (95.7 kg)   SpO2 95%   BMI 32.08 kg/m   BP Readings from Last 3 Encounters:  06/14/23 130/84  04/21/23 118/70  01/13/23 124/78    Wt Readings from Last 3 Encounters:  06/14/23 211 lb (95.7 kg)  04/21/23 213 lb (96.6 kg)  01/13/23 213 lb (96.6 kg)    Physical Exam Constitutional:      General: He is not in acute distress.    Appearance: He is well-developed. He is obese.     Comments: NAD  Eyes:     Conjunctiva/sclera: Conjunctivae normal.     Pupils: Pupils are equal, round, and reactive to light.  Neck:     Thyroid: No thyromegaly.     Vascular: No JVD.  Cardiovascular:     Rate and Rhythm: Normal rate and regular rhythm.     Heart sounds: Normal heart sounds. No murmur heard.    No friction rub. No gallop.  Pulmonary:     Effort: Pulmonary effort is normal. No respiratory distress.     Breath sounds: Normal breath sounds. No wheezing or rales.  Chest:     Chest wall: No tenderness.  Abdominal:     General: Bowel sounds are normal. There is no distension.     Palpations: Abdomen is soft. There is no mass.     Tenderness: There is no abdominal tenderness. There is no guarding or rebound.  Musculoskeletal:        General: No tenderness. Normal range of motion.     Cervical back: Normal range of motion.     Right lower leg: Edema present.     Left lower leg: Edema present.  Lymphadenopathy:     Cervical: No cervical adenopathy.  Skin:    General: Skin is warm and dry.     Findings: No rash.  Neurological:     Mental Status: He is alert and oriented to person, place, and time.     Cranial Nerves: No cranial nerve deficit.     Motor: No abnormal muscle tone.     Coordination: Coordination normal.     Gait: Gait normal.     Deep Tendon Reflexes: Reflexes are normal and symmetric.  Psychiatric:        Behavior: Behavior normal.        Thought Content: Thought content normal.        Judgment: Judgment normal.   LLE 1+, NT RLE trace edema   Lab  Results  Component Value Date   WBC 6.9 04/21/2023   HGB 13.5 04/21/2023   HCT 40.1 04/21/2023   PLT 273.0 04/21/2023   GLUCOSE 118 (H) 04/21/2023   CHOL 105 11/20/2022   TRIG 108 11/20/2022   HDL 49 11/20/2022  LDLCALC 36 11/20/2022   ALT 13 04/21/2023   AST 21 04/21/2023   NA 138 04/21/2023   K 4.0 04/21/2023   CL 101 04/21/2023   CREATININE 1.60 (H) 04/21/2023   BUN 17 04/21/2023   CO2 18 (L) 04/21/2023   TSH 10.85 (H) 10/13/2022   PSA 1.18 07/02/2017   INR 2.3 03/03/2012   HGBA1C 6.2 04/21/2023   MICROALBUR 0.7 04/04/2018    No results found.  Assessment & Plan:   Problem List Items Addressed This Visit     Diabetes mellitus, type 2 (HCC) (Chronic)    Cont on Metformin, Prandin Metformin 500 mg tid  BMET, A1c q 3 mo      Hypertension associated with diabetes (HCC) (Chronic)    Cont on Metoprolol  Cont w/Furosemide. Zaroxolyn - TAKE ONE TABLET 30 MINUTES PRIOR TO TAKING YOUR LASIX, once every week.       Relevant Medications   metolazone (ZAROXOLYN) 2.5 MG tablet   Paroxysmal atrial fibrillation (HCC) (Chronic)    F/u w/Dr. Tenny Craw Cont on amiodarone, Eliquis, Toprol      Relevant Medications   metolazone (ZAROXOLYN) 2.5 MG tablet   CRI (chronic renal insufficiency), stage 3 (moderate) (HCC)     Monitor GFR. GFR is 42      Bilateral leg edema - Primary    Acute on chronic Worse, now better L>R Cont w/Furosemide. Zaroxolyn - TAKE ONE TABLET 30 MINUTES PRIOR TO TAKING YOUR LASIX, once every week. On Eliquis - doubt DVT Coban wrap Venous Doppler if not better Will not get D dimer due to low GFR No CHF symptoms now          Meds ordered this encounter  Medications   metolazone (ZAROXOLYN) 2.5 MG tablet    Sig: TAKE ONE TABLET 30 MINUTES PRIOR TO TAKING YOUR LASIX, once every week    Dispense:  30 tablet    Refill:  3      Follow-up: Return in about 4 weeks (around 07/12/2023) for a follow-up visit.  Sonda Primes, MD

## 2023-06-16 ENCOUNTER — Encounter (INDEPENDENT_AMBULATORY_CARE_PROVIDER_SITE_OTHER): Payer: Self-pay

## 2023-06-29 ENCOUNTER — Other Ambulatory Visit: Payer: Self-pay | Admitting: Internal Medicine

## 2023-07-01 ENCOUNTER — Encounter (INDEPENDENT_AMBULATORY_CARE_PROVIDER_SITE_OTHER): Payer: Self-pay

## 2023-07-02 DIAGNOSIS — H43812 Vitreous degeneration, left eye: Secondary | ICD-10-CM | POA: Diagnosis not present

## 2023-07-02 DIAGNOSIS — H353211 Exudative age-related macular degeneration, right eye, with active choroidal neovascularization: Secondary | ICD-10-CM | POA: Diagnosis not present

## 2023-07-02 DIAGNOSIS — H59811 Chorioretinal scars after surgery for detachment, right eye: Secondary | ICD-10-CM | POA: Diagnosis not present

## 2023-07-02 DIAGNOSIS — H35372 Puckering of macula, left eye: Secondary | ICD-10-CM | POA: Diagnosis not present

## 2023-07-02 DIAGNOSIS — H353122 Nonexudative age-related macular degeneration, left eye, intermediate dry stage: Secondary | ICD-10-CM | POA: Diagnosis not present

## 2023-07-14 ENCOUNTER — Ambulatory Visit: Payer: Medicare Other | Admitting: Internal Medicine

## 2023-07-26 ENCOUNTER — Ambulatory Visit (INDEPENDENT_AMBULATORY_CARE_PROVIDER_SITE_OTHER): Payer: Medicare Other | Admitting: Internal Medicine

## 2023-07-26 ENCOUNTER — Encounter: Payer: Self-pay | Admitting: Internal Medicine

## 2023-07-26 VITALS — BP 114/70 | HR 83 | Temp 98.4°F | Ht 68.0 in | Wt 204.0 lb

## 2023-07-26 DIAGNOSIS — K5901 Slow transit constipation: Secondary | ICD-10-CM | POA: Diagnosis not present

## 2023-07-26 DIAGNOSIS — I152 Hypertension secondary to endocrine disorders: Secondary | ICD-10-CM | POA: Diagnosis not present

## 2023-07-26 DIAGNOSIS — E538 Deficiency of other specified B group vitamins: Secondary | ICD-10-CM | POA: Diagnosis not present

## 2023-07-26 DIAGNOSIS — I480 Paroxysmal atrial fibrillation: Secondary | ICD-10-CM | POA: Diagnosis not present

## 2023-07-26 DIAGNOSIS — Z7984 Long term (current) use of oral hypoglycemic drugs: Secondary | ICD-10-CM

## 2023-07-26 DIAGNOSIS — E1159 Type 2 diabetes mellitus with other circulatory complications: Secondary | ICD-10-CM

## 2023-07-26 MED ORDER — LUBIPROSTONE 24 MCG PO CAPS: 24.0000 ug | ORAL_CAPSULE | Freq: Two times a day (BID) | ORAL | 5 refills | Status: DC

## 2023-07-26 NOTE — Assessment & Plan Note (Addendum)
Worse  - use LOC Try [place]

## 2023-07-26 NOTE — Assessment & Plan Note (Signed)
On B12 

## 2023-07-26 NOTE — Assessment & Plan Note (Signed)
Cont on Metformin, Prandin Metformin 500 mg tid  BMET, A1c q 3 mo

## 2023-07-26 NOTE — Assessment & Plan Note (Signed)
Cont on Metoprolol  Cont w/Furosemide. Zaroxolyn - TAKE ONE TABLET 30 MINUTES PRIOR TO TAKING YOUR LASIX, once every week.

## 2023-07-26 NOTE — Progress Notes (Signed)
Subjective:  Patient ID: [name], male    DOB: 05-27-1935  Age: 88 y.o. MRN: [id number]  CC: Follow-up (3 mnth f/u, constipation )   HPI [name] presents for CAD, HTN, DM C/o constipation: taking WM stool stoftner and a laxative: BMs are hard, not regular   Outpatient Medications Prior to Visit  Medication Sig Dispense Refill   Accu-Chek Softclix Lancets lancets Use to check blood sugar daily DX:E11.59 100 each 3   albuterol (VENTOLIN HFA) 108 (90 Base) MCG/ACT inhaler TAKE 2 PUFFS BY MOUTH EVERY 6 HOURS AS NEEDED FOR WHEEZE OR SHORTNESS OF BREATH 6.7 each 2   amiodarone (PACERONE) 200 MG tablet TAKE 1 TABLET (200 MG TOTAL) BY MOUTH DAILY. EXCEPT SATURDAY AND SUNDAY. 90 tablet 1   apixaban (ELIQUIS) 5 MG TABS tablet TAKE 1 TABLET BY MOUTH TWICE A DAY 60 tablet 6   bisacodyl (DULCOLAX) 5 MG EC tablet Take 5 mg by mouth daily as needed for moderate constipation.     Blood Glucose Monitoring Suppl (ACCU-CHEK AVIVA PLUS) w/Device KIT Use to check blood sugar daily DX:E11.59 1 kit 0   cholecalciferol (VITAMIN D) 1000 units tablet Take 2,000 Units by mouth daily.     docusate sodium (COLACE) 100 MG capsule Take 200 mg by mouth at bedtime.     ferrous sulfate 325 (65 FE) MG tablet Take 1 tablet (325 mg total) by mouth 2 (two) times daily with a meal. 60 tablet 6   furosemide (LASIX) 40 MG tablet TAKE 2 TABLETS (80 MG TOTAL) BY MOUTH DAILY. CAN TAKE AN EXTRA 1/2 TABLET BY MOUTH ONCE A WEEK. 180 tablet 1   gabapentin (NEURONTIN) 100 MG capsule TAKE 1 CAPSULE (100 MG TOTAL) BY MOUTH 3 TIMES A DAY 270 capsule 3   glucose blood (ACCU-CHEK AVIVA PLUS) test strip Use to check blood sugar daily DX:E11.59 100 each 3   KLOR-CON M20 20 MEQ tablet TAKE 1 TABLET BY MOUTH EVERY DAY 90 tablet 3   latanoprost (XALATAN) 0.005 % ophthalmic solution Place 1 drop into both eyes daily.     metFORMIN (GLUCOPHAGE) 500 MG tablet TAKE 1 TABLET BY MOUTH THREE TIMES A DAY 270 tablet 3   metolazone  (ZAROXOLYN) 2.5 MG tablet TAKE ONE TABLET 30 MINUTES PRIOR TO TAKING YOUR LASIX, once every week 30 tablet 3   pantoprazole (PROTONIX) 40 MG tablet TAKE 1 TABLET BY MOUTH EVERY DAY 90 tablet 3   Propylene Glycol 0.6 % SOLN Place 1 drop into the left eye 2 (two) times daily as needed (for dry eyes).     repaglinide (PRANDIN) 2 MG tablet TAKE 1 TABLET BY MOUTH 3 TIMES DAILY BEFORE MEALS 270 tablet 3   simvastatin (ZOCOR) 10 MG tablet TAKE 1 TABLET BY MOUTH EVERY DAY 90 tablet 3   Tiotropium Bromide-Olodaterol (STIOLTO RESPIMAT) 2.5-2.5 MCG/ACT AERS Inhale 2 puffs into the lungs daily. 4 g 0   vitamin B-12 (CYANOCOBALAMIN) 250 MCG tablet Take 2 tablets (500 mcg total) by mouth daily.     No facility-administered medications prior to visit.    ROS: Review of Systems  Constitutional:  Positive for fatigue. Negative for appetite change and unexpected weight change.  HENT:  Negative for congestion, nosebleeds, sneezing, sore throat and trouble swallowing.   Eyes:  Negative for itching and visual disturbance.  Respiratory:  Negative for cough.   Cardiovascular:  Negative for chest pain, palpitations and leg swelling.  Gastrointestinal:  Positive for constipation. Negative for abdominal distention, blood  in stool, diarrhea and nausea.  Genitourinary:  Negative for frequency and hematuria.  Musculoskeletal:  Positive for back pain and gait problem. Negative for joint swelling and neck pain.  Skin:  Negative for color change and rash.  Neurological:  Negative for dizziness, tremors, speech difficulty and weakness.  Psychiatric/Behavioral:  Negative for agitation, dysphoric mood and sleep disturbance. The patient is not nervous/anxious.     Objective:  BP 114/70 (BP Location: Left Arm, Patient Position: Sitting, Cuff Size: Normal)   Pulse 83   Temp 98.4 F (36.9 C) (Oral)   Ht 5\' 8"  (1.727 m)   Wt 204 lb (92.5 kg)   SpO2 90%   BMI 31.02 kg/m   BP Readings from Last 3 Encounters:  07/26/23  114/70  06/14/23 130/84  04/21/23 118/70    Wt Readings from Last 3 Encounters:  07/26/23 204 lb (92.5 kg)  06/14/23 211 lb (95.7 kg)  04/21/23 213 lb (96.6 kg)    Physical Exam Constitutional:      General: He is not in acute distress.    Appearance: He is well-developed. He is obese.     Comments: NAD  Eyes:     Conjunctiva/sclera: Conjunctivae normal.     Pupils: Pupils are equal, round, and reactive to light.  Neck:     Thyroid: No thyromegaly.     Vascular: No JVD.  Cardiovascular:     Rate and Rhythm: Normal rate and regular rhythm.     Heart sounds: Normal heart sounds. No murmur heard.    No friction rub. No gallop.  Pulmonary:     Effort: Pulmonary effort is normal. No respiratory distress.     Breath sounds: Normal breath sounds. No wheezing or rales.  Chest:     Chest wall: No tenderness.  Abdominal:     General: Bowel sounds are normal. There is no distension.     Palpations: Abdomen is soft. There is no mass.     Tenderness: There is no abdominal tenderness. There is no guarding or rebound.  Musculoskeletal:        General: No tenderness. Normal range of motion.     Cervical back: Normal range of motion.  Lymphadenopathy:     Cervical: No cervical adenopathy.  Skin:    General: Skin is warm and dry.     Findings: No rash.  Neurological:     Mental Status: He is alert and oriented to person, place, and time.     Cranial Nerves: No cranial nerve deficit.     Motor: Weakness present. No abnormal muscle tone.     Coordination: Coordination abnormal.     Gait: Gait abnormal.     Deep Tendon Reflexes: Reflexes are normal and symmetric.  Psychiatric:        Behavior: Behavior normal.        Thought Content: Thought content normal.        Judgment: Judgment normal.     Lab Results  Component Value Date   WBC 6.9 04/21/2023   HGB 13.5 04/21/2023   HCT 40.1 04/21/2023   PLT 273.0 04/21/2023   GLUCOSE 118 (H) 04/21/2023   CHOL 105 11/20/2022   TRIG  108 11/20/2022   HDL 49 11/20/2022   LDLCALC 36 11/20/2022   ALT 13 04/21/2023   AST 21 04/21/2023   NA 138 04/21/2023   K 4.0 04/21/2023   CL 101 04/21/2023   CREATININE 1.60 (H) 04/21/2023   BUN 17 04/21/2023   CO2 18 (  L) 04/21/2023   TSH 10.85 (H) 10/13/2022   PSA 1.18 07/02/2017   INR 2.3 03/03/2012   HGBA1C 6.2 04/21/2023   MICROALBUR 0.7 04/04/2018    No results found.  Assessment & Plan:   Problem List Items Addressed This Visit     Diabetes mellitus, type 2 (HCC) - Primary (Chronic)    Cont on Metformin, Prandin Metformin 500 mg tid  BMET, A1c q 3 mo      Hypertension associated with diabetes (HCC) (Chronic)    Cont on Metoprolol  Cont w/Furosemide. Zaroxolyn - TAKE ONE TABLET 30 MINUTES PRIOR TO TAKING YOUR LASIX, once every week.       Paroxysmal atrial fibrillation (HCC) (Chronic)    F/u w/Dr. Tenny Craw Cont on amiodarone, Eliquis, Toprol      Constipation    Worse  - use LOC Try Amitiza      B12 deficiency    On B12         Meds ordered this encounter  Medications   lubiprostone (AMITIZA) 24 MCG capsule    Sig: Take 1 capsule (24 mcg total) by mouth 2 (two) times daily with a meal.    Dispense:  60 capsule    Refill:  5      Follow-up: Return in about 2 months (around 09/25/2023) for a follow-up visit.  Sonda Primes, MD

## 2023-07-26 NOTE — Assessment & Plan Note (Signed)
F/u w/Dr. Tenny Craw Cont on amiodarone, Eliquis, Toprol

## 2023-07-27 ENCOUNTER — Encounter: Payer: Self-pay | Admitting: Internal Medicine

## 2023-07-27 ENCOUNTER — Telehealth: Payer: Self-pay | Admitting: Internal Medicine

## 2023-07-27 NOTE — Telephone Encounter (Signed)
Patient called and said that the lubiprostone is too expensive can you please send in a cheaper medication.  Please send to [facility] on [address], [place] and call and let patient know.  [phone]

## 2023-07-28 MED ORDER — LACTULOSE 20 GM/30ML PO SOLN: 30.0000 mL | Freq: Two times a day (BID) | ORAL | 3 refills | Status: AC | PRN

## 2023-07-28 NOTE — Telephone Encounter (Signed)
Okay.  Noted.  Lactulose prescription sent in instead.  Thank you

## 2023-09-12 ENCOUNTER — Other Ambulatory Visit: Payer: Self-pay | Admitting: Internal Medicine

## 2023-09-24 ENCOUNTER — Ambulatory Visit (INDEPENDENT_AMBULATORY_CARE_PROVIDER_SITE_OTHER): Payer: Medicare Other

## 2023-09-24 VITALS — Ht 68.0 in | Wt 204.0 lb

## 2023-09-24 DIAGNOSIS — Z0000 Encounter for general adult medical examination without abnormal findings: Secondary | ICD-10-CM

## 2023-09-24 NOTE — Patient Instructions (Signed)
[name] , Thank you for taking time to come for your Medicare Wellness Visit. I appreciate your ongoing commitment to your health goals. Please review the following plan we discussed and let me know if I can assist you in the future.   Referrals/Orders/Follow-Ups/Clinician Recommendations: You are due for a Flu vaccine.  Keep up the good work.  This is a list of the screening recommended for you and due dates:  Health Maintenance  Topic Date Due   Complete foot exam   07/06/2019   Eye exam for diabetics  04/30/2022   COVID-19 Vaccine (3 - Pfizer risk series) 10/10/2023*   Flu Shot  02/14/2024*   Medicare Annual Wellness Visit  09/23/2024   DTaP/Tdap/Td vaccine (5 - Td or Tdap) 07/15/2032   Pneumonia Vaccine  Completed   HPV Vaccine  Aged Out   Zoster (Shingles) Vaccine  Discontinued  *Topic was postponed. The date shown is not the original due date.    Advanced directives: (Copy Requested) Please bring a copy of your health care power of attorney and living will to the office to be added to your chart at your convenience.  Next Medicare Annual Wellness Visit scheduled for next year: No

## 2023-09-24 NOTE — Progress Notes (Signed)
Subjective:   [name] is a 88 y.o. male who presents for Medicare Annual/Subsequent preventive examination.  Visit Complete: Virtual I connected with  Richrd Humbles on 09/24/23 by a video and audio enabled telemedicine application and verified that I am speaking with the correct person using two identifiers.  Patient Location: Home  Provider Location: Home Office  I discussed the limitations of evaluation and management by telemedicine. The patient expressed understanding and agreed to proceed.  Vital Signs: Because this visit was a virtual/telehealth visit, some criteria may be missing or patient reported. Any vitals not documented were not able to be obtained and vitals that have been documented are patient reported.  Cardiac Risk Factors include: advanced age (>76men, >46 women);hypertension;Other (see comment);diabetes mellitus;male gender, Risk factor comments: CAD, CHF, A-Fib     Objective:    Today's Vitals   09/24/23 1032  Weight: 204 lb (92.5 kg)  Height: 5\' 8"  (1.727 m)   Body mass index is 31.02 kg/m.     09/24/2023   10:51 AM 08/13/2022   10:30 AM 12/02/2021   11:31 PM 05/29/2021    1:29 PM 08/04/2020   11:20 PM 08/04/2020    7:24 AM 10/25/2019    5:36 AM  Advanced Directives  Does Patient Have a Medical Advance Directive? No No No No  No   Would patient like information on creating a medical advance directive?  No - Patient declined No - Patient declined No - Patient declined No - Patient declined  No - Patient declined    Current Medications (verified) Outpatient Encounter Medications as of 09/24/2023  Medication Sig   Accu-Chek Softclix Lancets lancets Use to check blood sugar daily DX:E11.59   albuterol (VENTOLIN HFA) 108 (90 Base) MCG/ACT inhaler TAKE 2 PUFFS BY MOUTH EVERY 6 HOURS AS NEEDED FOR WHEEZE OR SHORTNESS OF BREATH   amiodarone (PACERONE) 200 MG tablet TAKE 1 TABLET (200 MG TOTAL) BY MOUTH DAILY. EXCEPT SATURDAY AND SUNDAY.    apixaban (ELIQUIS) 5 MG TABS tablet TAKE 1 TABLET BY MOUTH TWICE A DAY   bisacodyl (DULCOLAX) 5 MG EC tablet Take 5 mg by mouth daily as needed for moderate constipation.   Blood Glucose Monitoring Suppl (ACCU-CHEK AVIVA PLUS) w/Device KIT Use to check blood sugar daily DX:E11.59   cholecalciferol (VITAMIN D) 1000 units tablet Take 2,000 Units by mouth daily.   docusate sodium (COLACE) 100 MG capsule Take 200 mg by mouth at bedtime.   ferrous sulfate 325 (65 FE) MG tablet Take 1 tablet (325 mg total) by mouth 2 (two) times daily with a meal.   furosemide (LASIX) 40 MG tablet TAKE 2 TABLETS (80 MG TOTAL) BY MOUTH DAILY. CAN TAKE AN EXTRA 1/2 TABLET BY MOUTH ONCE A WEEK.   gabapentin (NEURONTIN) 100 MG capsule TAKE 1 CAPSULE (100 MG TOTAL) BY MOUTH 3 TIMES A DAY   glucose blood (ACCU-CHEK AVIVA PLUS) test strip Use to check blood sugar daily DX:E11.59   KLOR-CON M20 20 MEQ tablet TAKE 1 TABLET BY MOUTH EVERY DAY   Lactulose 20 GM/30ML SOLN Take 30-60 mLs (20-40 g total) by mouth 2 (two) times daily as needed (severe constipation).   latanoprost (XALATAN) 0.005 % ophthalmic solution Place 1 drop into both eyes daily.   metFORMIN (GLUCOPHAGE) 500 MG tablet TAKE 1 TABLET BY MOUTH THREE TIMES A DAY   metolazone (ZAROXOLYN) 2.5 MG tablet TAKE ONE TABLET 30 MINUTES PRIOR TO TAKING YOUR LASIX, ONCE EVERY WEEK   pantoprazole (PROTONIX) 40  MG tablet TAKE 1 TABLET BY MOUTH EVERY DAY   Propylene Glycol 0.6 % SOLN Place 1 drop into the left eye 2 (two) times daily as needed (for dry eyes).   repaglinide (PRANDIN) 2 MG tablet TAKE 1 TABLET BY MOUTH 3 TIMES DAILY BEFORE MEALS   simvastatin (ZOCOR) 10 MG tablet TAKE 1 TABLET BY MOUTH EVERY DAY   Tiotropium Bromide-Olodaterol (STIOLTO RESPIMAT) 2.5-2.5 MCG/ACT AERS Inhale 2 puffs into the lungs daily.   vitamin B-12 (CYANOCOBALAMIN) 250 MCG tablet Take 2 tablets (500 mcg total) by mouth daily.   No facility-administered encounter medications on file as of  09/24/2023.    Allergies (verified) Patient has no known allergies.   History: Past Medical History:  Diagnosis Date   Acute diastolic CHF (congestive heart failure) (HCC) 08/04/2020   Arthritis    Atrial fibrillation (HCC)    post op; amiodarone and coumadin continued for 3 mos post op   CARCINOMA, SKIN, SQUAMOUS CELL 01/07/2010   Cataract    beginning of cataracts   COLONIC POLYPS, HX OF 04/26/2007   Coronary artery disease    s/p CABG 3/12: L-LAD, S-CFX (Dr. Cornelius Moras); EF 45% at cath prior to CABG   DIABETES MELLITUS, TYPE II 04/26/2007   Hyperlipidemia    HYPERTENSION 04/26/2007   NEPHROLITHIASIS, HX OF 04/26/2007   Nuclear sclerotic cataract of right eye 10/02/2021   Surgery Dr. Zenaida Niece, 06-03-2022 with posterior capsule disruption posterior dislocation lens, disruption of pupil margin superiorly   SBO (small bowel obstruction) (HCC) 10/2015   Partial    Shortness of breath dyspnea    with exertion   Unspecified hearing loss 12/18/2008   Past Surgical History:  Procedure Laterality Date   BACK SURGERY     disectomy   CARDIOVERSION N/A 06/08/2018   Procedure: CARDIOVERSION;  Surgeon: Jodelle Red, MD;  Location[facility] ENDOSCOPY;  Service: Cardiovascular;  Laterality: N/A;   CARDIOVERSION N/A 07/25/2018   Procedure: CARDIOVERSION;  Surgeon: Pricilla Riffle, MD;  Location: [facility] ENDOSCOPY;  Service: Cardiovascular;  Laterality: N/A;   COLONOSCOPY     CORONARY ARTERY BYPASS GRAFT  01/12/2011   LUMBAR LAMINECTOMY/DECOMPRESSION MICRODISCECTOMY N/A 05/08/2015   Procedure: LUMBAR LAMINECTOMY/DECOMPRESSION MICRODISCECTOMY 1 LEVEL Lumbar four five;  Surgeon: Venita Lick, MD;  Location: [facility] OR;  Service: Orthopedics;  Laterality: N/A;   TOTAL HIP ARTHROPLASTY Left    left on 04/15/07   TYMPANIC MEMBRANE REPAIR  1981   Family History  Problem Relation Age of Onset   Heart failure Mother    Heart disease Father    Cancer Sister    Cancer Brother    Heart disease Brother    Breast cancer  Sister    Colon cancer Neg Hx    Esophageal cancer Neg Hx    Rectal cancer Neg Hx    Stomach cancer Neg Hx    Social History   Socioeconomic History   Marital status: Married    Spouse name: [place]   Number of children: 2   Years of education: 9th   Highest education level: 9th grade  Occupational History   Not on file  Tobacco Use   Smoking status: Former    Current packs/day: 0.00    Average packs/day: 2.0 packs/day for 30.0 years (60.0 ttl pk-yrs)    Types: Cigarettes    Start date: 01/02/1951    Quit date: 01/02/1981    Years since quitting: 42.7   Smokeless tobacco: Never  Vaping Use   Vaping status: Never Used  Substance  and Sexual Activity   Alcohol use: No   Drug use: No   Sexual activity: Not Currently  Other Topics Concern   Not on file  Social History Narrative   Lives with wife and 1 daughter.  Has one dog   Social Determinants of Health   Financial Resource Strain: Low Risk  (09/24/2023)   Overall Financial Resource Strain (CARDIA)    Difficulty of Paying Living Expenses: Not hard at all  Food Insecurity: No Food Insecurity (09/24/2023)   Hunger Vital Sign    Worried About Running Out of Food in the Last Year: Never true    Ran Out of Food in the Last Year: Never true  Transportation Needs: No Transportation Needs (09/24/2023)   PRAPARE - Administrator, Civil Service (Medical): No    Lack of Transportation (Non-Medical): No  Physical Activity: Inactive (09/24/2023)   Exercise Vital Sign    Days of Exercise per Week: 0 days    Minutes of Exercise per Session: 0 min  Stress: No Stress Concern Present (09/24/2023)   [employer] of Occupational Health - Occupational Stress Questionnaire    Feeling of Stress : Not at all  Social Connections: Moderately Integrated (09/24/2023)   Social Connection and Isolation Panel [NHANES]    Frequency of Communication with Friends and Family: Three times a week    Frequency of Social Gatherings with  Friends and Family: Twice a week    Attends Religious Services: More than 4 times per year    Active Member of [employer] or Organizations: No    Attends Engineer, structural: Never    Marital Status: Married    Tobacco Counseling Counseling given: Not Answered   Clinical Intake:  Pre-visit preparation completed: Yes  Pain : No/denies pain     BMI - recorded: 31.02 Nutritional Status: BMI > 30  Obese Nutritional Risks: None Diabetes: Yes CBG done?: No Did pt. bring in CBG monitor from home?: No  How often do you need to have someone help you when you read instructions, pamphlets, or other written materials from your doctor or pharmacy?: 1 - Never  Interpreter Needed?: No  Information entered by :: Dezaree Tracey, RMA   Activities of Daily Living    09/24/2023   10:47 AM  In your present state of health, do you have any difficulty performing the following activities:  Hearing? 0  Vision? 0  Difficulty concentrating or making decisions? 0  Walking or climbing stairs? 0  Dressing or bathing? 0  Doing errands, shopping? 0  Preparing Food and eating ? N  Using the Toilet? N  In the past six months, have you accidently leaked urine? N  Do you have problems with loss of bowel control? N  Managing your Medications? N  Managing your Finances? N  Housekeeping or managing your Housekeeping? N    Patient Care Team: [name], Georgina Quint, MD as PCP - General (Internal Medicine) Pricilla Riffle, MD as PCP - Cardiology (Cardiology) Venita Lick, MD as Consulting Physician (Orthopedic Surgery) Sheran Luz, MD as Consulting Physician (Physical Medicine and Rehabilitation) Venita Lick, MD as Consulting Physician (Orthopedic Surgery) Szabat, Vinnie Level, [facility] (Inactive) as Pharmacist (Pharmacist) Stephannie Li, MD as Consulting Physician (Ophthalmology)  Indicate any recent Medical Services you may have received from other than [facility] providers in the past year (date may  be approximate).     Assessment:   This is a routine wellness examination for [place].  Hearing/Vision screen Hearing Screening -  Comments:: Denies hearing difficulties   Vision Screening - Comments:: Wears eyeglasses   Goals Addressed               This Visit's Progress     Increase the amount that I walk   Not on track     I will get up go to the [facility] and walk once a week      Patient Stated (pt-stated)        Stay healthy.      Depression Screen    09/24/2023   10:57 AM 04/21/2023    2:30 PM 01/13/2023    2:43 PM 10/13/2022    2:49 PM 08/13/2022   10:18 AM 06/01/2022    2:53 PM 05/29/2021    1:34 PM  PHQ 2/9 Scores  PHQ - 2 Score 0 0 0 0 1 0 0  PHQ- 9 Score 2          Fall Risk    09/24/2023   10:51 AM 04/21/2023    2:51 PM 04/21/2023    2:30 PM 01/13/2023    2:43 PM 10/13/2022    2:49 PM  Fall Risk   Falls in the past year? 1  0 1 1  Number falls in past yr: 1  0 1 0  Injury with Fall? 0  0 0 0  Risk for fall due to :  History of fall(s) No Fall Risks History of fall(s) History of fall(s)  Follow up Falls prevention discussed;Falls evaluation completed  Falls evaluation completed Falls evaluation completed Falls evaluation completed    MEDICARE RISK AT HOME: Medicare Risk at Home Any stairs in or around the home?: No Home free of loose throw rugs in walkways, pet beds, electrical cords, etc?: Yes Adequate lighting in your home to reduce risk of falls?: Yes Life alert?: No Use of a cane, walker or w/c?: No Grab bars in the bathroom?: Yes Shower chair or bench in shower?: Yes Elevated toilet seat or a handicapped toilet?: Yes  TIMED UP AND GO:  Was the test performed?  No    Cognitive Function:    07/02/2017    2:33 PM  MMSE - Mini Mental State Exam  Orientation to time 5  Orientation to Place 5  Registration 3  Attention/ Calculation 4  Recall 2  Language- name 2 objects 2  Language- repeat 1  Language- follow 3 step command 3  Language- read &  follow direction 1  Write a sentence 1  Copy design 1  Total score 28        09/24/2023   10:53 AM 08/13/2022   10:26 AM  6CIT Screen  What Year? 0 points 0 points  What month? 0 points 0 points  What time? 0 points 0 points  Count back from 20 0 points 0 points  Months in reverse 0 points 0 points  Repeat phrase 0 points 2 points  Total Score 0 points 2 points    Immunizations Immunization History  Administered Date(s) Administered   Fluad Quad(high Dose 65+) 08/18/2019, 11/29/2020, 09/01/2021, 10/13/2022   Influenza Nasal 08/28/2016   Influenza Whole 09/14/2007, 09/01/2010   Influenza, High Dose Seasonal PF 09/13/2009, 08/01/2012, 08/29/2014, 09/06/2017, 08/16/2018   Influenza,inj,Quad PF,6+ Mos 08/20/2015   Influenza-Unspecified 08/16/2008, 09/16/2008, 08/28/2010, 08/17/2011, 08/25/2013, 08/16/2014   PFIZER(Purple Top)SARS-COV-2 Vaccination 02/23/2020, 03/19/2020   Pneumococcal Conjugate-13 08/16/2014, 08/29/2014   Pneumococcal Polysaccharide-23 10/16/2002, 02/18/2016   Pneumococcal-Unspecified 08/16/2005, 08/28/2010   Td 11/16/1993, 12/18/2008   Td (Adult),5 Lf  Tetanus Toxid, Preservative Free 07/15/2022   Tdap 11/16/2009   Zoster, Live 07/24/2011    TDAP status: Up to date  Flu Vaccine status: Due, Education has been provided regarding the importance of this vaccine. Advised may receive this vaccine at local pharmacy or Health Dept. Aware to provide a copy of the vaccination record if obtained from local pharmacy or Health Dept. Verbalized acceptance and understanding.  Pneumococcal vaccine status: Up to date  Covid-19 vaccine status: Completed vaccines  Qualifies for Shingles Vaccine? Yes   Zostavax completed Yes   Shingrix Completed?: No.    Education has been provided regarding the importance of this vaccine. Patient has been advised to call insurance company to determine out of pocket expense if they have not yet received this vaccine. Advised may also receive  vaccine at local pharmacy or Health Dept. Verbalized acceptance and understanding.  Screening Tests Health Maintenance  Topic Date Due   FOOT EXAM  07/06/2019   OPHTHALMOLOGY EXAM  04/30/2022   COVID-19 Vaccine (3 - Pfizer risk series) 10/10/2023 (Originally 04/16/2020)   INFLUENZA VACCINE  02/14/2024 (Originally 06/17/2023)   Medicare Annual Wellness (AWV)  09/23/2024   DTaP/Tdap/Td (5 - Td or Tdap) 07/15/2032   Pneumonia Vaccine 73+ Years old  Completed   HPV VACCINES  Aged Out   Zoster Vaccines- Shingrix  Discontinued    Health Maintenance  Health Maintenance Due  Topic Date Due   FOOT EXAM  07/06/2019   OPHTHALMOLOGY EXAM  04/30/2022    Colorectal cancer screening: No longer required.   Lung Cancer Screening: (Low Dose CT Chest recommended if Age 60-80 years, 20 pack-year currently smoking OR have quit w/in 15years.) does not qualify.   Lung Cancer Screening Referral: N/A  Additional Screening:  Hepatitis C Screening: does not qualify; Completed N/A  Vision Screening: Recommended annual ophthalmology exams for early detection of glaucoma and other disorders of the eye. Is the patient up to date with their annual eye exam?  Yes  Who is the provider or what is the name of the office in which the patient attends annual eye exams? [facility] If pt is not established with a provider, would they like to be referred to a provider to establish care? Yes .   Dental Screening: Recommended annual dental exams for proper oral hygiene  Diabetic Foot Exam: Diabetic Foot Exam: Overdue, Pt has been advised about the importance in completing this exam. Pt is scheduled for diabetic foot exam on 09/27/2023.  Community Resource Referral / Chronic Care Management: CRR required this visit?  No   CCM required this visit?  No     Plan:     I have personally reviewed and noted the following in the patient's chart:   Medical and social history Use of alcohol, tobacco or illicit drugs   Current medications and supplements including opioid prescriptions. Patient is not currently taking opioid prescriptions. Functional ability and status Nutritional status Physical activity Advanced directives List of other physicians Hospitalizations, surgeries, and ER visits in previous 12 months Vitals Screenings to include cognitive, depression, and falls Referrals and appointments  In addition, I have reviewed and discussed with patient certain preventive protocols, quality metrics, and best practice recommendations. A written personalized care plan for preventive services as well as general preventive health recommendations were provided to patient.     Oceana Walthall L Tonyia Marschall, CMA   09/24/2023   After Visit Summary: (MyChart) Due to this being a telephonic visit, the after visit summary with patients personalized plan  was offered to patient via MyChart   Nurse Notes: Patient is due for a foot exam and a Flu vaccine, which he will get Flu vaccine during his up coming office visit.  He is also due for a yearly eye exam.  I have sent letter for record request form D. Sanders office today.  Patient would like a refill on test strips today.  Patient had no other concerns to address today.  He would like to wait for a while to schedule for next year's AWV.

## 2023-09-27 ENCOUNTER — Ambulatory Visit: Payer: Medicare Other | Admitting: Internal Medicine

## 2023-09-27 ENCOUNTER — Encounter: Payer: Self-pay | Admitting: Internal Medicine

## 2023-09-27 VITALS — BP 118/70 | HR 69 | Temp 98.3°F | Ht 68.0 in | Wt 212.0 lb

## 2023-09-27 DIAGNOSIS — I480 Paroxysmal atrial fibrillation: Secondary | ICD-10-CM

## 2023-09-27 DIAGNOSIS — Z7984 Long term (current) use of oral hypoglycemic drugs: Secondary | ICD-10-CM | POA: Diagnosis not present

## 2023-09-27 DIAGNOSIS — Z23 Encounter for immunization: Secondary | ICD-10-CM

## 2023-09-27 DIAGNOSIS — E1159 Type 2 diabetes mellitus with other circulatory complications: Secondary | ICD-10-CM

## 2023-09-27 DIAGNOSIS — E538 Deficiency of other specified B group vitamins: Secondary | ICD-10-CM | POA: Diagnosis not present

## 2023-09-27 DIAGNOSIS — I5031 Acute diastolic (congestive) heart failure: Secondary | ICD-10-CM

## 2023-09-27 NOTE — Progress Notes (Signed)
Subjective:  Patient ID: [name], male    DOB: 16-Apr-1935  Age: 88 y.o. MRN: [id number]  CC: Medical Management of Chronic Issues (2 mnth f/u)   HPI [name] presents for DM, HTN, CAD  Outpatient Medications Prior to Visit  Medication Sig Dispense Refill   Accu-Chek Softclix Lancets lancets Use to check blood sugar daily DX:E11.59 100 each 3   albuterol (VENTOLIN HFA) 108 (90 Base) MCG/ACT inhaler TAKE 2 PUFFS BY MOUTH EVERY 6 HOURS AS NEEDED FOR WHEEZE OR SHORTNESS OF BREATH 6.7 each 2   amiodarone (PACERONE) 200 MG tablet TAKE 1 TABLET (200 MG TOTAL) BY MOUTH DAILY. EXCEPT SATURDAY AND SUNDAY. 90 tablet 1   apixaban (ELIQUIS) 5 MG TABS tablet TAKE 1 TABLET BY MOUTH TWICE A DAY 60 tablet 6   bisacodyl (DULCOLAX) 5 MG EC tablet Take 5 mg by mouth daily as needed for moderate constipation.     Blood Glucose Monitoring Suppl (ACCU-CHEK AVIVA PLUS) w/Device KIT Use to check blood sugar daily DX:E11.59 1 kit 0   cholecalciferol (VITAMIN D) 1000 units tablet Take 2,000 Units by mouth daily.     docusate sodium (COLACE) 100 MG capsule Take 200 mg by mouth at bedtime.     ferrous sulfate 325 (65 FE) MG tablet Take 1 tablet (325 mg total) by mouth 2 (two) times daily with a meal. 60 tablet 6   furosemide (LASIX) 40 MG tablet TAKE 2 TABLETS (80 MG TOTAL) BY MOUTH DAILY. CAN TAKE AN EXTRA 1/2 TABLET BY MOUTH ONCE A WEEK. 180 tablet 1   gabapentin (NEURONTIN) 100 MG capsule TAKE 1 CAPSULE (100 MG TOTAL) BY MOUTH 3 TIMES A DAY 270 capsule 3   glucose blood (ACCU-CHEK AVIVA PLUS) test strip Use to check blood sugar daily DX:E11.59 100 each 3   KLOR-CON M20 20 MEQ tablet TAKE 1 TABLET BY MOUTH EVERY DAY 90 tablet 3   Lactulose 20 GM/30ML SOLN Take 30-60 mLs (20-40 g total) by mouth 2 (two) times daily as needed (severe constipation). 946 mL 3   latanoprost (XALATAN) 0.005 % ophthalmic solution Place 1 drop into both eyes daily.     metFORMIN (GLUCOPHAGE) 500 MG tablet TAKE 1  TABLET BY MOUTH THREE TIMES A DAY 270 tablet 3   metolazone (ZAROXOLYN) 2.5 MG tablet TAKE ONE TABLET 30 MINUTES PRIOR TO TAKING YOUR LASIX, ONCE EVERY WEEK 90 tablet 1   pantoprazole (PROTONIX) 40 MG tablet TAKE 1 TABLET BY MOUTH EVERY DAY 90 tablet 3   Propylene Glycol 0.6 % SOLN Place 1 drop into the left eye 2 (two) times daily as needed (for dry eyes).     repaglinide (PRANDIN) 2 MG tablet TAKE 1 TABLET BY MOUTH 3 TIMES DAILY BEFORE MEALS 270 tablet 3   simvastatin (ZOCOR) 10 MG tablet TAKE 1 TABLET BY MOUTH EVERY DAY 90 tablet 3   Tiotropium Bromide-Olodaterol (STIOLTO RESPIMAT) 2.5-2.5 MCG/ACT AERS Inhale 2 puffs into the lungs daily. 4 g 0   vitamin B-12 (CYANOCOBALAMIN) 250 MCG tablet Take 2 tablets (500 mcg total) by mouth daily.     No facility-administered medications prior to visit.    ROS: Review of Systems  Constitutional:  Negative for appetite change, fatigue and unexpected weight change.  HENT:  Negative for congestion, nosebleeds, sneezing, sore throat and trouble swallowing.   Eyes:  Negative for itching and visual disturbance.  Respiratory:  Negative for cough.   Cardiovascular:  Negative for chest pain, palpitations and leg swelling.  Gastrointestinal:  Negative for abdominal distention, blood in stool, diarrhea and nausea.  Genitourinary:  Negative for frequency and hematuria.  Musculoskeletal:  Positive for back pain and gait problem. Negative for joint swelling and neck pain.  Skin:  Negative for rash.  Neurological:  Negative for dizziness, tremors, speech difficulty and weakness.  Psychiatric/Behavioral:  Negative for agitation, dysphoric mood, sleep disturbance and suicidal ideas. The patient is not nervous/anxious.     Objective:  BP 118/70 (BP Location: Left Arm, Patient Position: Sitting, Cuff Size: Normal)   Pulse 69   Temp 98.3 F (36.8 C) (Oral)   Ht 5\' 8"  (1.727 m)   Wt 212 lb (96.2 kg)   SpO2 90%   BMI 32.23 kg/m   BP Readings from Last 3  Encounters:  09/27/23 118/70  07/26/23 114/70  06/14/23 130/84    Wt Readings from Last 3 Encounters:  09/27/23 212 lb (96.2 kg)  09/24/23 204 lb (92.5 kg)  07/26/23 204 lb (92.5 kg)    Physical Exam Constitutional:      General: He is not in acute distress.    Appearance: He is well-developed.     Comments: NAD  Eyes:     Conjunctiva/sclera: Conjunctivae normal.     Pupils: Pupils are equal, round, and reactive to light.  Neck:     Thyroid: No thyromegaly.     Vascular: No JVD.  Cardiovascular:     Rate and Rhythm: Normal rate and regular rhythm.     Heart sounds: Normal heart sounds. No murmur heard.    No friction rub. No gallop.  Pulmonary:     Effort: Pulmonary effort is normal. No respiratory distress.     Breath sounds: Normal breath sounds. No wheezing or rales.  Chest:     Chest wall: No tenderness.  Abdominal:     General: Bowel sounds are normal. There is no distension.     Palpations: Abdomen is soft. There is no mass.     Tenderness: There is no abdominal tenderness. There is no guarding or rebound.  Musculoskeletal:        General: Tenderness present. Normal range of motion.     Cervical back: Normal range of motion.     Right lower leg: Edema present.     Left lower leg: Edema present.  Lymphadenopathy:     Cervical: No cervical adenopathy.  Skin:    General: Skin is warm and dry.     Findings: No rash.  Neurological:     Mental Status: He is alert and oriented to person, place, and time. Mental status is at baseline.     Cranial Nerves: No cranial nerve deficit.     Motor: No abnormal muscle tone.     Coordination: Coordination normal.     Gait: Gait abnormal.     Deep Tendon Reflexes: Reflexes are normal and symmetric.  Psychiatric:        Behavior: Behavior normal.        Thought Content: Thought content normal.        Judgment: Judgment normal.   Antalgic gait Trace edema L>R  Lab Results  Component Value Date   WBC 6.9 04/21/2023    HGB 13.5 04/21/2023   HCT 40.1 04/21/2023   PLT 273.0 04/21/2023   GLUCOSE 118 (H) 04/21/2023   CHOL 105 11/20/2022   TRIG 108 11/20/2022   HDL 49 11/20/2022   LDLCALC 36 11/20/2022   ALT 13 04/21/2023   AST 21 04/21/2023  NA 138 04/21/2023   K 4.0 04/21/2023   CL 101 04/21/2023   CREATININE 1.60 (H) 04/21/2023   BUN 17 04/21/2023   CO2 18 (L) 04/21/2023   TSH 10.85 (H) 10/13/2022   PSA 1.18 07/02/2017   INR 2.3 03/03/2012   HGBA1C 6.2 04/21/2023   MICROALBUR 0.7 04/04/2018    No results found.  Assessment & Plan:   Problem List Items Addressed This Visit     Diabetes mellitus, type 2 (HCC) - Primary (Chronic)    Cont on Metformin, Prandin Metformin 500 mg tid  BMET, A1c q 3 mo      Relevant Orders   Hemoglobin A1c   Comprehensive metabolic panel   Paroxysmal atrial fibrillation (HCC) (Chronic)    F/u w/Dr. Tenny Craw Cont on amiodarone, Eliquis, Toprol      Acute diastolic CHF (congestive heart failure) (HCC)    Doing well Flu shot      B12 deficiency    On B12         No orders of the defined types were placed in this encounter.     Follow-up: Return in about 3 months (around 12/28/2023) for a follow-up visit.  Sonda Primes, MD

## 2023-09-27 NOTE — Assessment & Plan Note (Signed)
F/u w/Dr. Tenny Craw Cont on amiodarone, Eliquis, Toprol

## 2023-09-27 NOTE — Assessment & Plan Note (Signed)
Doing well Flu shot

## 2023-09-27 NOTE — Addendum Note (Signed)
Addended by: Delsa Grana R on: 09/27/2023 05:05 PM   Modules accepted: Orders

## 2023-09-27 NOTE — Assessment & Plan Note (Signed)
Cont on Metformin, Prandin Metformin 500 mg tid  BMET, A1c q 3 mo

## 2023-09-27 NOTE — Assessment & Plan Note (Signed)
On B12 

## 2023-10-26 ENCOUNTER — Other Ambulatory Visit: Payer: Self-pay | Admitting: Internal Medicine

## 2023-10-30 ENCOUNTER — Other Ambulatory Visit: Payer: Self-pay | Admitting: Internal Medicine

## 2023-11-03 DIAGNOSIS — H353112 Nonexudative age-related macular degeneration, right eye, intermediate dry stage: Secondary | ICD-10-CM | POA: Diagnosis not present

## 2023-11-24 ENCOUNTER — Other Ambulatory Visit: Payer: Self-pay | Admitting: Internal Medicine

## 2023-12-03 DIAGNOSIS — H353112 Nonexudative age-related macular degeneration, right eye, intermediate dry stage: Secondary | ICD-10-CM | POA: Diagnosis not present

## 2023-12-06 ENCOUNTER — Telehealth: Payer: Self-pay | Admitting: Internal Medicine

## 2023-12-06 NOTE — Telephone Encounter (Signed)
Pt c/o medication issue:  1. Name of Medication:   apixaban (ELIQUIS) 5 MG TABS tablet    2. How are you currently taking this medication (dosage and times per day)? TAKE 1 TABLET BY MOUTH TWICE A DAY   3. Are you having a reaction (difficulty breathing--STAT)? No  4. What is your medication issue? Pt states that his price for Eliquis has gone up to $302 from $47 and he cannot afford that

## 2023-12-09 NOTE — Telephone Encounter (Signed)
Patient is calling back for update. Please advise  

## 2023-12-09 NOTE — Telephone Encounter (Signed)
Left voicemail to return call to office.

## 2023-12-11 ENCOUNTER — Other Ambulatory Visit: Payer: Self-pay | Admitting: Internal Medicine

## 2023-12-16 ENCOUNTER — Other Ambulatory Visit: Payer: Self-pay | Admitting: Internal Medicine

## 2023-12-16 NOTE — Telephone Encounter (Signed)
Copied from CRM [id number]. Topic: Clinical - Medication Refill >> Dec 16, 2023  2:23 PM Elizebeth Brooking wrote: Most Recent Primary Care Visit:  Provider: Tresa Garter  Department: [facility]  Visit Type: OFFICE VISIT  Date: 09/27/2023  Medication: glucose blood (ACCU-CHEK AVIVA PLUS) test strip  Has the patient contacted their pharmacy? No (Agent: If no, request that the patient contact the pharmacy for the refill. If patient does not wish to contact the pharmacy document the reason why and proceed with request.) (Agent: If yes, when and what did the pharmacy advise?)  Is this the correct pharmacy for this prescription? Yes If no, delete pharmacy and type the correct one.  This is the patient's preferred pharmacy:  [id number] [place], [place] - [address] AT [facility] [address] [place] [address] Phone: [phone] Fax: [phone]     Has the prescription been filled recently? No  Is the patient out of the medication? Yes  Has the patient been seen for an appointment in the last year OR does the patient have an upcoming appointment? Yes  Can we respond through MyChart? Yes  Agent: Please be advised that Rx refills may take up to 3 business days. We ask that you follow-up with your pharmacy.

## 2023-12-17 MED ORDER — ACCU-CHEK AVIVA PLUS VI STRP: ORAL_STRIP | 3 refills | Status: AC

## 2023-12-24 ENCOUNTER — Other Ambulatory Visit: Payer: Self-pay | Admitting: Internal Medicine

## 2023-12-28 ENCOUNTER — Encounter: Payer: Self-pay | Admitting: Internal Medicine

## 2023-12-28 ENCOUNTER — Ambulatory Visit (INDEPENDENT_AMBULATORY_CARE_PROVIDER_SITE_OTHER): Payer: Medicare Other | Admitting: Internal Medicine

## 2023-12-28 VITALS — BP 118/70 | HR 67 | Temp 98.0°F | Ht 68.0 in | Wt 206.0 lb

## 2023-12-28 DIAGNOSIS — Z7984 Long term (current) use of oral hypoglycemic drugs: Secondary | ICD-10-CM

## 2023-12-28 DIAGNOSIS — E1159 Type 2 diabetes mellitus with other circulatory complications: Secondary | ICD-10-CM

## 2023-12-28 DIAGNOSIS — E1122 Type 2 diabetes mellitus with diabetic chronic kidney disease: Secondary | ICD-10-CM

## 2023-12-28 DIAGNOSIS — I5031 Acute diastolic (congestive) heart failure: Secondary | ICD-10-CM | POA: Diagnosis not present

## 2023-12-28 DIAGNOSIS — I10 Essential (primary) hypertension: Secondary | ICD-10-CM | POA: Diagnosis not present

## 2023-12-28 DIAGNOSIS — I480 Paroxysmal atrial fibrillation: Secondary | ICD-10-CM | POA: Diagnosis not present

## 2023-12-28 DIAGNOSIS — I2510 Atherosclerotic heart disease of native coronary artery without angina pectoris: Secondary | ICD-10-CM

## 2023-12-28 DIAGNOSIS — I152 Hypertension secondary to endocrine disorders: Secondary | ICD-10-CM

## 2023-12-28 DIAGNOSIS — J849 Interstitial pulmonary disease, unspecified: Secondary | ICD-10-CM

## 2023-12-28 DIAGNOSIS — E1142 Type 2 diabetes mellitus with diabetic polyneuropathy: Secondary | ICD-10-CM

## 2023-12-28 LAB — COMPREHENSIVE METABOLIC PANEL
ALT: 11 U/L (ref 0–53)
AST: 20 U/L (ref 0–37)
Albumin: 4 g/dL (ref 3.5–5.2)
Alkaline Phosphatase: 57 U/L (ref 39–117)
BUN: 21 mg/dL (ref 6–23)
CO2: 28 meq/L (ref 19–32)
Calcium: 9.3 mg/dL (ref 8.4–10.5)
Chloride: 102 meq/L (ref 96–112)
Creatinine, Ser: 1.72 mg/dL — ABNORMAL HIGH (ref 0.40–1.50)
GFR: 35.45 mL/min — ABNORMAL LOW (ref >60.00)
Glucose, Bld: 102 mg/dL — ABNORMAL HIGH (ref 70–99)
Potassium: 4.3 meq/L (ref 3.5–5.1)
Sodium: 139 meq/L (ref 135–145)
Total Bilirubin: 0.6 mg/dL (ref 0.2–1.2)
Total Protein: 7.4 g/dL (ref 6.0–8.3)

## 2023-12-28 LAB — BASIC METABOLIC PANEL
BUN: 21 mg/dL (ref 6–23)
CO2: 28 meq/L (ref 19–32)
Calcium: 9.3 mg/dL (ref 8.4–10.5)
Chloride: 102 meq/L (ref 96–112)
Creatinine, Ser: 1.72 mg/dL — ABNORMAL HIGH (ref 0.40–1.50)
GFR: 35.45 mL/min — ABNORMAL LOW (ref >60.00)
Glucose, Bld: 102 mg/dL — ABNORMAL HIGH (ref 70–99)
Potassium: 4.3 meq/L (ref 3.5–5.1)
Sodium: 139 meq/L (ref 135–145)

## 2023-12-28 NOTE — Assessment & Plan Note (Signed)
Cont on Metoprolol  Cont w/Furosemide. Zaroxolyn - TAKE ONE TABLET 30 MINUTES PRIOR TO TAKING YOUR LASIX, once every week.

## 2023-12-28 NOTE — Assessment & Plan Note (Signed)
Cont w/Eliquis, Amlodipine, Lopressor, Zocor

## 2023-12-28 NOTE — Assessment & Plan Note (Signed)
F/u w/Dr. Tenny Craw Cont on amiodarone, Eliquis, Toprol

## 2023-12-28 NOTE — Addendum Note (Signed)
Addended by: Delsa Grana R on: 12/28/2023 03:48 PM   Modules accepted: Orders

## 2023-12-28 NOTE — Assessment & Plan Note (Addendum)
Cont on Metformin, Prandin Metformin 500 mg tid  CMET, A1c q 3 mo

## 2023-12-28 NOTE — Progress Notes (Signed)
Subjective:  Patient ID: [name], male    DOB: September 29, 1935  Age: 89 y.o. MRN: [id number]  CC: Medical Management of Chronic Issues (3 mnth f/u)   HPI BRAYDN CARNEIRO presents for OA, neuropathy, CHF, weak legs  Outpatient Medications Prior to Visit  Medication Sig Dispense Refill   Accu-Chek Softclix Lancets lancets Use to check blood sugar daily DX:E11.59 100 each 3   albuterol (VENTOLIN HFA) 108 (90 Base) MCG/ACT inhaler TAKE 2 PUFFS BY MOUTH EVERY 6 HOURS AS NEEDED FOR WHEEZE OR SHORTNESS OF BREATH 6.7 each 2   amiodarone (PACERONE) 200 MG tablet TAKE 1 TABLET (200 MG TOTAL) BY MOUTH DAILY. EXCEPT SATURDAY AND SUNDAY. 90 tablet 0   apixaban (ELIQUIS) 5 MG TABS tablet TAKE 1 TABLET BY MOUTH TWICE A DAY 60 tablet 6   bisacodyl (DULCOLAX) 5 MG EC tablet Take 5 mg by mouth daily as needed for moderate constipation.     Blood Glucose Monitoring Suppl (ACCU-CHEK AVIVA PLUS) w/Device KIT Use to check blood sugar daily DX:E11.59 1 kit 0   cholecalciferol (VITAMIN D) 1000 units tablet Take 2,000 Units by mouth daily.     docusate sodium (COLACE) 100 MG capsule Take 200 mg by mouth at bedtime.     ferrous sulfate 325 (65 FE) MG tablet Take 1 tablet (325 mg total) by mouth 2 (two) times daily with a meal. 60 tablet 6   furosemide (LASIX) 40 MG tablet TAKE 2 TABLETS (80 MG TOTAL) BY MOUTH DAILY. CAN TAKE AN EXTRA 1/2 TABLET BY MOUTH ONCE A WEEK. 62 tablet 1   gabapentin (NEURONTIN) 100 MG capsule TAKE 1 CAPSULE (100 MG TOTAL) BY MOUTH 3 TIMES A DAY 270 capsule 3   glucose blood (ACCU-CHEK AVIVA PLUS) test strip Use to check blood sugar daily DX:E11.59 100 each 3   KLOR-CON M20 20 MEQ tablet TAKE 1 TABLET BY MOUTH EVERY DAY 90 tablet 3   Lactulose 20 GM/30ML SOLN Take 30-60 mLs (20-40 g total) by mouth 2 (two) times daily as needed (severe constipation). 946 mL 3   latanoprost (XALATAN) 0.005 % ophthalmic solution Place 1 drop into both eyes daily.     metFORMIN (GLUCOPHAGE) 500 MG  tablet TAKE 1 TABLET BY MOUTH THREE TIMES A DAY 270 tablet 3   metolazone (ZAROXOLYN) 2.5 MG tablet TAKE ONE TABLET 30 MINUTES PRIOR TO TAKING YOUR LASIX, ONCE EVERY WEEK 90 tablet 1   pantoprazole (PROTONIX) 40 MG tablet TAKE 1 TABLET BY MOUTH EVERY DAY 90 tablet 3   Propylene Glycol 0.6 % SOLN Place 1 drop into the left eye 2 (two) times daily as needed (for dry eyes).     repaglinide (PRANDIN) 2 MG tablet TAKE 1 TABLET BY MOUTH 3 TIMES DAILY BEFORE MEALS 270 tablet 3   simvastatin (ZOCOR) 10 MG tablet TAKE 1 TABLET BY MOUTH EVERY DAY 90 tablet 0   Tiotropium Bromide-Olodaterol (STIOLTO RESPIMAT) 2.5-2.5 MCG/ACT AERS Inhale 2 puffs into the lungs daily. 4 g 0   vitamin B-12 (CYANOCOBALAMIN) 250 MCG tablet Take 2 tablets (500 mcg total) by mouth daily.     Vitamin D, Ergocalciferol, (DRISDOL) 1.25 MG (50000 UNIT) CAPS capsule TAKE 1 CAPSULE (50,000 UNITS TOTAL) BY MOUTH EVERY 7 (SEVEN) DAYS 5 capsule 12   No facility-administered medications prior to visit.    ROS: Review of Systems  Constitutional:  Positive for fatigue. Negative for appetite change and unexpected weight change.  HENT:  Negative for congestion, nosebleeds, sneezing, sore throat and  trouble swallowing.   Eyes:  Negative for itching and visual disturbance.  Respiratory:  Positive for shortness of breath. Negative for cough and chest tightness.   Cardiovascular:  Negative for chest pain, palpitations and leg swelling.  Gastrointestinal:  Negative for abdominal distention, blood in stool, diarrhea and nausea.  Genitourinary:  Negative for frequency and hematuria.  Musculoskeletal:  Positive for back pain and gait problem. Negative for joint swelling and neck pain.  Skin:  Negative for rash.  Neurological:  Positive for weakness. Negative for dizziness, tremors and speech difficulty.  Psychiatric/Behavioral:  Positive for decreased concentration. Negative for agitation, confusion, dysphoric mood, sleep disturbance and suicidal  ideas. The patient is nervous/anxious.     Objective:  BP 118/70 (BP Location: Left Arm, Patient Position: Sitting, Cuff Size: Normal)   Pulse 67   Temp 98 F (36.7 C) (Oral)   Ht 5\' 8"  (1.727 m)   Wt 206 lb (93.4 kg)   SpO2 95%   BMI 31.32 kg/m   BP Readings from Last 3 Encounters:  12/28/23 118/70  09/27/23 118/70  07/26/23 114/70    Wt Readings from Last 3 Encounters:  12/28/23 206 lb (93.4 kg)  09/27/23 212 lb (96.2 kg)  09/24/23 204 lb (92.5 kg)    Physical Exam Constitutional:      General: He is not in acute distress.    Appearance: He is well-developed. He is obese.     Comments: NAD  Eyes:     Conjunctiva/sclera: Conjunctivae normal.     Pupils: Pupils are equal, round, and reactive to light.  Neck:     Thyroid: No thyromegaly.     Vascular: No JVD.  Cardiovascular:     Rate and Rhythm: Normal rate and regular rhythm.     Heart sounds: Normal heart sounds. No murmur heard.    No friction rub. No gallop.  Pulmonary:     Effort: Pulmonary effort is normal. No respiratory distress.     Breath sounds: Normal breath sounds. No wheezing or rales.  Chest:     Chest wall: No tenderness.  Abdominal:     General: Bowel sounds are normal. There is no distension.     Palpations: Abdomen is soft. There is no mass.     Tenderness: There is no abdominal tenderness. There is no guarding or rebound.  Musculoskeletal:        General: Tenderness present. Normal range of motion.     Cervical back: Normal range of motion.     Right lower leg: No edema.     Left lower leg: No edema.  Lymphadenopathy:     Cervical: No cervical adenopathy.  Skin:    General: Skin is warm and dry.     Findings: No rash.  Neurological:     Mental Status: He is alert and oriented to person, place, and time.     Cranial Nerves: No cranial nerve deficit.     Motor: No abnormal muscle tone.     Coordination: Coordination abnormal.     Gait: Gait abnormal.     Deep Tendon Reflexes:  Reflexes are normal and symmetric. Reflexes normal.  Psychiatric:        Behavior: Behavior normal.        Thought Content: Thought content normal.        Judgment: Judgment normal.   Not using any walking aids B trace edema Lungs w/o wheezes  Lab Results  Component Value Date   WBC 6.9 04/21/2023  HGB 13.5 04/21/2023   HCT 40.1 04/21/2023   PLT 273.0 04/21/2023   GLUCOSE 118 (H) 04/21/2023   CHOL 105 11/20/2022   TRIG 108 11/20/2022   HDL 49 11/20/2022   LDLCALC 36 11/20/2022   ALT 13 04/21/2023   AST 21 04/21/2023   NA 138 04/21/2023   K 4.0 04/21/2023   CL 101 04/21/2023   CREATININE 1.60 (H) 04/21/2023   BUN 17 04/21/2023   CO2 18 (L) 04/21/2023   TSH 10.85 (H) 10/13/2022   PSA 1.18 07/02/2017   INR 2.3 03/03/2012   HGBA1C 6.2 04/21/2023   MICROALBUR 0.7 04/04/2018    No results found.  Assessment & Plan:   Problem List Items Addressed This Visit     Diabetes mellitus, type 2 (HCC) - Primary (Chronic)   Cont on Metformin, Prandin Metformin 500 mg tid  CMET, A1c q 3 mo      Hypertension associated with diabetes (HCC) (Chronic)   Cont on Metoprolol  Cont w/Furosemide. Zaroxolyn - TAKE ONE TABLET 30 MINUTES PRIOR TO TAKING YOUR LASIX, once every week.       Paroxysmal atrial fibrillation (HCC) (Chronic)   F/u w/Dr. Tenny Craw Cont on amiodarone, Eliquis, Toprol      CAD (coronary artery disease), native coronary artery (Chronic)   Cont w/Eliquis, Amlodipine, Lopressor, Zocor      Diabetic neuropathy (HCC)   Will try Gabapentin 100 mg bid Treat DM, edema      ILD (interstitial lung disease) (HCC)   Chronic SOB/DOE Discussed Cont w/wt loss      Acute diastolic CHF (congestive heart failure) (HCC)   Chronic SOB/DOE/ILD Discussed Cont w/wt loss         No orders of the defined types were placed in this encounter.     Follow-up: Return in about 3 months (around 03/26/2024) for a follow-up visit.  Sonda Primes, MD

## 2023-12-28 NOTE — Assessment & Plan Note (Signed)
Chronic SOB/DOE/ILD Discussed Cont w/wt loss

## 2023-12-28 NOTE — Assessment & Plan Note (Signed)
Chronic SOB/DOE Discussed Cont w/wt loss

## 2023-12-28 NOTE — Assessment & Plan Note (Signed)
Will try Gabapentin 100 mg bid Treat DM, edema

## 2023-12-29 ENCOUNTER — Encounter: Payer: Self-pay | Admitting: Internal Medicine

## 2023-12-29 LAB — HEMOGLOBIN A1C: Hgb A1c MFr Bld: 6.3 % (ref 4.6–6.5)

## 2024-01-02 DIAGNOSIS — H353112 Nonexudative age-related macular degeneration, right eye, intermediate dry stage: Secondary | ICD-10-CM | POA: Diagnosis not present

## 2024-01-18 ENCOUNTER — Other Ambulatory Visit: Payer: Self-pay | Admitting: Internal Medicine

## 2024-01-18 DIAGNOSIS — M25511 Pain in right shoulder: Secondary | ICD-10-CM | POA: Diagnosis not present

## 2024-01-25 ENCOUNTER — Other Ambulatory Visit: Payer: Self-pay | Admitting: Internal Medicine

## 2024-01-31 DIAGNOSIS — H353122 Nonexudative age-related macular degeneration, left eye, intermediate dry stage: Secondary | ICD-10-CM | POA: Diagnosis not present

## 2024-01-31 DIAGNOSIS — H35372 Puckering of macula, left eye: Secondary | ICD-10-CM | POA: Diagnosis not present

## 2024-01-31 DIAGNOSIS — T1511XA Foreign body in conjunctival sac, right eye, initial encounter: Secondary | ICD-10-CM | POA: Diagnosis not present

## 2024-01-31 DIAGNOSIS — H43812 Vitreous degeneration, left eye: Secondary | ICD-10-CM | POA: Diagnosis not present

## 2024-01-31 DIAGNOSIS — H353211 Exudative age-related macular degeneration, right eye, with active choroidal neovascularization: Secondary | ICD-10-CM | POA: Diagnosis not present

## 2024-01-31 DIAGNOSIS — H31091 Other chorioretinal scars, right eye: Secondary | ICD-10-CM | POA: Diagnosis not present

## 2024-01-31 NOTE — Progress Notes (Unsigned)
 Cardiology Office Note    Date:  02/01/2024  ID:  [name], DOB [birth year], MRN [id number] PCP:  Tresa Garter, MD  Cardiologist:  Dietrich Pates, MD  Electrophysiologist:  None   Chief Complaint: overdue f/u CAD, afib  History of Present Illness: [name] is a 89 y.o. male with visit-pertinent history of CAD (s/p CABG 2012 with LIMA-LAD, SVG-Cx, DM, HTN, nephrolithiasis, HLD, persistent atrial fib (s/p DCCV 2019 with ERAF treated with amiodarone and repeat DCCV), mild-moderate MR, CKD 3b, dizziness seen for follow-up. Last monitor 05/2019 for syncope showed NSR, first degree AVB, rare PVC. Last nuc 03/2021 showed small defect of severe severity present in the basal inferior, mid inferior and apical inferior location, partially reversible and is consistent with inferior infarct with peri infarct ischemia, EF 40%.  F/u echo 03/2021 to clarify EF showed LVEF 60-65%, moderate LAE, mild RAE, mild-moderate MR, mild aortic sclerosis without stenosis. (The patient had had a prior echo in 2019 noting EF 40-45% but Dr. Tenny Craw had reviewed this herself and felt visually it appeared higher. Subsequent LVEF have been normal.) Given issues with anemia at the time of stress test, medical therapy was recommended. PFTs in the past showed some obstruction; Dr. Tenny Craw decreased amiodarone to 5 days a week in 2023.  He is seen for follow-up today overall feeling well without new cardiac complaints. No CP, SOB, palpitations, edema, orthopnea. He is only taking Eliquis 5mg  once a day due to perceived excess bleeding if he cuts himself. Denies pathologic bleeding. He appears to be in coarse afib/flutter today with controlled rate, reviewed with Dr. Tenny Craw. He is unaware of this. He reports adherence to his other medications except metolazone because it was so infrequent he would not remember to take it.   Labwork independently reviewed: 12/2023 K 4.3, Cr 1.72 (prev 1.6), LFTs ok 04/2023 CBC  wnl 11/2022 LDL 36, trig 108 09/2022 TSH 10.85, FT4 ok - per PCP  ROS: .    Please see the history of present illness.  All other systems are reviewed and otherwise negative.  Studies Reviewed: Marland Kitchen    EKG:  EKG is ordered today, personally reviewed, demonstrating:  EKG Interpretation Date/Time:  Tuesday February 01 2024 16:02:08 EDT Ventricular Rate:  82 PR Interval:    QRS Duration:  104 QT Interval:  386 QTC Calculation: 450 R Axis:   -48  Text Interpretation: Suspected coarse atrial fibrillation versus flutter Left axis deviation Inferior infarct , age undetermined Anteroseptal infarct , age undetermined Confirmed by Ronie Spies [phone]) on 02/01/2024 4:48:34 PM   CV Studies: Cardiac studies reviewed are outlined and summarized above. Otherwise please see EMR for full report.   Current Reported Medications:.    Current Meds  Medication Sig   Accu-Chek Softclix Lancets lancets Use to check blood sugar daily DX:E11.59   albuterol (VENTOLIN HFA) 108 (90 Base) MCG/ACT inhaler TAKE 2 PUFFS BY MOUTH EVERY 6 HOURS AS NEEDED FOR WHEEZE OR SHORTNESS OF BREATH   amiodarone (PACERONE) 200 MG tablet TAKE 1 TABLET (200 MG TOTAL) BY MOUTH DAILY. EXCEPT SATURDAY AND SUNDAY.   apixaban (ELIQUIS) 5 MG TABS tablet Take 1 tablet (5 mg total) by mouth 2 (two) times daily.   bisacodyl (DULCOLAX) 5 MG EC tablet Take 5 mg by mouth daily as needed for moderate constipation.   Blood Glucose Monitoring Suppl (ACCU-CHEK AVIVA PLUS) w/Device KIT Use to check blood sugar daily DX:E11.59   cholecalciferol (VITAMIN D) 1000 units  tablet Take 2,000 Units by mouth daily.   docusate sodium (COLACE) 100 MG capsule Take 200 mg by mouth at bedtime.   ferrous sulfate 325 (65 FE) MG tablet Take 1 tablet (325 mg total) by mouth 2 (two) times daily with a meal.   furosemide (LASIX) 40 MG tablet TAKE 2 TABLETS (80 MG TOTAL) BY MOUTH DAILY. CAN TAKE AN EXTRA 1/2 TABLET BY MOUTH ONCE A WEEK.   gabapentin (NEURONTIN) 100 MG  capsule TAKE 1 CAPSULE (100 MG TOTAL) BY MOUTH 3 TIMES A DAY   glucose blood (ACCU-CHEK AVIVA PLUS) test strip Use to check blood sugar daily DX:E11.59   Lactulose 20 GM/30ML SOLN Take 30-60 mLs (20-40 g total) by mouth 2 (two) times daily as needed (severe constipation).   latanoprost (XALATAN) 0.005 % ophthalmic solution Place 1 drop into both eyes daily.   metFORMIN (GLUCOPHAGE) 500 MG tablet TAKE 1 TABLET BY MOUTH THREE TIMES A DAY   pantoprazole (PROTONIX) 40 MG tablet TAKE 1 TABLET BY MOUTH EVERY DAY   potassium chloride SA (KLOR-CON M20) 20 MEQ tablet TAKE 1 TABLET BY MOUTH EVERY DAY   Propylene Glycol 0.6 % SOLN Place 1 drop into the left eye 2 (two) times daily as needed (for dry eyes).   repaglinide (PRANDIN) 2 MG tablet TAKE 1 TABLET BY MOUTH 3 TIMES DAILY BEFORE MEALS   simvastatin (ZOCOR) 10 MG tablet TAKE 1 TABLET BY MOUTH EVERY DAY   Tiotropium Bromide-Olodaterol (STIOLTO RESPIMAT) 2.5-2.5 MCG/ACT AERS Inhale 2 puffs into the lungs daily.   vitamin B-12 (CYANOCOBALAMIN) 250 MCG tablet Take 2 tablets (500 mcg total) by mouth daily.   Vitamin D, Ergocalciferol, (DRISDOL) 1.25 MG (50000 UNIT) CAPS capsule TAKE 1 CAPSULE (50,000 UNITS TOTAL) BY MOUTH EVERY 7 (SEVEN) DAYS   metolazone (ZAROXOLYN) 2.5 MG tablet TAKE ONE TABLET 30 MINUTES PRIOR TO TAKING YOUR LASIX, ONCE EVERY WEEK    Physical Exam:    VS:  BP (!) 140/70   Pulse 82   Ht 5\' 8"  (1.727 m)   Wt 201 lb (91.2 kg)   SpO2 96%   BMI 30.56 kg/m    Wt Readings from Last 3 Encounters:  02/01/24 201 lb (91.2 kg)  12/28/23 206 lb (93.4 kg)  09/27/23 212 lb (96.2 kg)    GEN: Well nourished, well developed in no acute distress NECK: No JVD; No carotid bruits CARDIAC: RRR, no murmurs, rubs, gallops RESPIRATORY:  Clear to auscultation without rales, wheezing or rhonchi  ABDOMEN: Soft, non-tender, non-distended EXTREMITIES:  No edema; No acute deformity   Asessement and Plan:.    1. CAD, HLD - denies any interim symptoms.  Continue clinical surveillance for recurrent symptoms. Not on ASA due to concomitant Eliquis. Remains on simvastatin 10mg  daily for now, this is the max dose while on amiodarone. Update CMET, lipid profile, direct LDL today - obtaining basic labs given recurrence of coarse afib/flutter today.   2. Chronic dyspnea - patient denies any acute change in this recently. Pulse ox wnl.  3. Persistent atrial fibrillation - EKG reviewed with Dr. Tenny Craw, appears to show coarse afib versus flutter. He denies any symptomatic change recently. We will update bloodwork today including CBC, CMET, TSH with reflex. Update 2v CXR given amiodarone use. Given paucity of symptoms and current rate control, Dr. Tenny Craw suggests that we keep on same medications for now and reassess repeat EKG with clinic OV in a few weeks. Reduce Eliquis dose to 2.5mg  BID given age and Cr >1.5 recently. Emphasized  BID dosing. Patient is also getting yearly eye exams per his report.  4. Mild-moderate MR - per d/w Dr. Tenny Craw, deferring updated echo for now as he is asymptomatic, follow clinically.  5. Essential HTN, chronic HFpEF - BP suboptimally controlled, recheck 140/70 by me. I have asked him to keep a log at home at least 3 hours after AM BP meds and bring to follow-up. He otherwise appears euvolemic. We will remove metolazone from his medicine list and officially stop it given that he was not using this consistently anyway. Continue furosemide at present dose 80mg  daily with extra 1/2 tablet once a week if needed.    Disposition: F/u with Dr. Tenny Craw in a few weeks - J. Witty sent msg to [facility] per Dr. Charlott Rakes request.  Signed, Laurann Montana, PA-C

## 2024-02-01 ENCOUNTER — Ambulatory Visit: Payer: Medicare Other | Attending: Physician Assistant | Admitting: Physician Assistant

## 2024-02-01 ENCOUNTER — Encounter: Payer: Self-pay | Admitting: Physician Assistant

## 2024-02-01 VITALS — BP 140/70 | HR 82 | Ht 68.0 in | Wt 201.0 lb

## 2024-02-01 DIAGNOSIS — I4819 Other persistent atrial fibrillation: Secondary | ICD-10-CM

## 2024-02-01 DIAGNOSIS — E785 Hyperlipidemia, unspecified: Secondary | ICD-10-CM

## 2024-02-01 DIAGNOSIS — I2510 Atherosclerotic heart disease of native coronary artery without angina pectoris: Secondary | ICD-10-CM

## 2024-02-01 DIAGNOSIS — I10 Essential (primary) hypertension: Secondary | ICD-10-CM

## 2024-02-01 DIAGNOSIS — I5032 Chronic diastolic (congestive) heart failure: Secondary | ICD-10-CM

## 2024-02-01 DIAGNOSIS — I340 Nonrheumatic mitral (valve) insufficiency: Secondary | ICD-10-CM | POA: Diagnosis not present

## 2024-02-01 DIAGNOSIS — H353112 Nonexudative age-related macular degeneration, right eye, intermediate dry stage: Secondary | ICD-10-CM | POA: Diagnosis not present

## 2024-02-01 DIAGNOSIS — R0609 Other forms of dyspnea: Secondary | ICD-10-CM

## 2024-02-01 MED ORDER — APIXABAN 2.5 MG PO TABS: 2.5000 mg | ORAL_TABLET | Freq: Two times a day (BID) | ORAL | 11 refills | Status: AC

## 2024-02-01 NOTE — Patient Instructions (Signed)
 Medication Instructions:  Your physician has recommended you make the following change in your medication:  STOP Metolazaone  REDUCE the Eliquis to 2.5 mg taking 1 twice a day  *If you need a refill on your cardiac medications before your next appointment, please call your pharmacy*   Lab Work: TODAY:  CMET, LIPID, CBC, DIRECT LDL, & TSH RFX ON ABNORMAL TO FREE T4  If you have labs (blood work) drawn today and your tests are completely normal, you will receive your results only by: MyChart Message (if you have MyChart) OR A paper copy in the mail If you have any lab test that is abnormal or we need to change your treatment, we will call you to review the results.   Testing/Procedures: A chest x-ray takes a picture of the organs and structures inside the chest, including the heart, lungs, and blood vessels. This test can show several things, including, whether the heart is enlarges; whether fluid is building up in the lungs; and whether pacemaker / defibrillator leads are still in place.  GO TO [facility] [address] TO HAVE THIS DONE TOMORROW 02/02/24   Follow-Up: At [facility], you and your health needs are our priority.  As part of our continuing mission to provide you with exceptional heart care, we have created designated Provider Care Teams.  These Care Teams include your primary Cardiologist (physician) and Advanced Practice Providers (APPs -  Physician Assistants and Nurse Practitioners) who all work together to provide you with the care you need, when you need it.  We recommend signing up for the patient portal called "MyChart".  Sign up information is provided on this After Visit Summary.  MyChart is used to connect with patients for Virtual Visits (Telemedicine).  Patients are able to view lab/test results, encounter notes, upcoming appointments, etc.  Non-urgent messages can be sent to your provider as well.   To learn more about what you can do with  MyChart, go to [email or web address].    Your next appointment:   2 week(s)  Provider:   Dietrich Pates, MD     Other Instructions

## 2024-02-02 ENCOUNTER — Ambulatory Visit
Admission: RE | Admit: 2024-02-02 | Discharge: 2024-02-02 | Disposition: A | Discharge status: Home / Self Care | Source: Ambulatory Visit | Attending: Physician Assistant | Admitting: Physician Assistant

## 2024-02-02 ENCOUNTER — Other Ambulatory Visit: Payer: Self-pay | Admitting: *Deleted

## 2024-02-02 ENCOUNTER — Telehealth: Payer: Self-pay | Admitting: *Deleted

## 2024-02-02 DIAGNOSIS — E785 Hyperlipidemia, unspecified: Secondary | ICD-10-CM

## 2024-02-02 DIAGNOSIS — R0609 Other forms of dyspnea: Secondary | ICD-10-CM

## 2024-02-02 DIAGNOSIS — R5383 Other fatigue: Secondary | ICD-10-CM | POA: Diagnosis not present

## 2024-02-02 DIAGNOSIS — I4819 Other persistent atrial fibrillation: Secondary | ICD-10-CM

## 2024-02-02 DIAGNOSIS — Z79899 Other long term (current) drug therapy: Secondary | ICD-10-CM

## 2024-02-02 DIAGNOSIS — I340 Nonrheumatic mitral (valve) insufficiency: Secondary | ICD-10-CM

## 2024-02-02 DIAGNOSIS — R0602 Shortness of breath: Secondary | ICD-10-CM | POA: Diagnosis not present

## 2024-02-02 DIAGNOSIS — I2510 Atherosclerotic heart disease of native coronary artery without angina pectoris: Secondary | ICD-10-CM

## 2024-02-02 LAB — CBC
Hematocrit: 44 % (ref 37.5–51.0)
Hemoglobin: 14 g/dL (ref 13.0–17.7)
MCH: 29.9 pg (ref 26.6–33.0)
MCHC: 31.8 g/dL (ref 31.5–35.7)
MCV: 94 fL (ref 79–97)
Platelets: 276 10*3/uL (ref 150–450)
RBC: 4.69 x10E6/uL (ref 4.14–5.80)
RDW: 14.2 % (ref 11.6–15.4)
WBC: 7.8 10*3/uL (ref 3.4–10.8)

## 2024-02-02 LAB — T4F: T4,Free (Direct): 0.93 ng/dL (ref 0.82–1.77)

## 2024-02-02 LAB — COMPREHENSIVE METABOLIC PANEL
ALT: 15 IU/L (ref 0–44)
AST: 21 IU/L (ref 0–40)
Albumin: 4 g/dL (ref 3.7–4.7)
Alkaline Phosphatase: 63 IU/L (ref 44–121)
BUN/Creatinine Ratio: 16 (ref 10–24)
BUN: 25 mg/dL (ref 8–27)
Bilirubin Total: 0.4 mg/dL (ref 0.0–1.2)
CO2: 22 mmol/L (ref 20–29)
Calcium: 9.1 mg/dL (ref 8.6–10.2)
Chloride: 105 mmol/L (ref 96–106)
Creatinine, Ser: 1.56 mg/dL — ABNORMAL HIGH (ref 0.76–1.27)
Globulin, Total: 2.7 g/dL (ref 1.5–4.5)
Glucose: 97 mg/dL (ref 70–99)
Potassium: 5.4 mmol/L — ABNORMAL HIGH (ref 3.5–5.2)
Sodium: 143 mmol/L (ref 134–144)
Total Protein: 6.7 g/dL (ref 6.0–8.5)
eGFR: 43 mL/min/{1.73_m2} — ABNORMAL LOW (ref >59)

## 2024-02-02 LAB — LIPID PANEL
Chol/HDL Ratio: 2.3 ratio (ref 0.0–5.0)
Cholesterol, Total: 109 mg/dL (ref 100–199)
HDL: 47 mg/dL (ref >39)
LDL Chol Calc (NIH): 45 mg/dL (ref 0–99)
Triglycerides: 85 mg/dL (ref 0–149)
VLDL Cholesterol Cal: 17 mg/dL (ref 5–40)

## 2024-02-02 LAB — LDL CHOLESTEROL, DIRECT: LDL Direct: 52 mg/dL (ref 0–99)

## 2024-02-02 LAB — TSH RFX ON ABNORMAL TO FREE T4: TSH: 9.7 u[IU]/mL — ABNORMAL HIGH (ref 0.450–4.500)

## 2024-02-02 NOTE — Telephone Encounter (Signed)
-----   Message from [name] sent at 02/02/2024  8:15 AM EDT ----- Labs are stable except potassium level spuriously high. TSH also abnormal but free T4 is normal so will continue to keep an eye on this. Please have patient return for recheck potassium level at his convenience. May be lab error. Otherwise labs OK, and dose change for Eliquis to 2.5mg  BID remains appropriate as we did yesterday.

## 2024-02-02 NOTE — Addendum Note (Signed)
 Addended by: Burnetta Sabin on: 02/02/2024 10:09 AM   Modules accepted: Orders

## 2024-02-03 LAB — POTASSIUM: Potassium: 5.2 mmol/L (ref 3.5–5.2)

## 2024-02-03 NOTE — Addendum Note (Signed)
 Addended by: Debbe Bales on: 02/03/2024 10:21 AM   Modules accepted: Orders

## 2024-02-03 NOTE — Telephone Encounter (Signed)
 LVM for pt to D/C potassium. Medication list updated. Will call pt's daughter  per (DPR) to [facility].

## 2024-02-03 NOTE — Telephone Encounter (Signed)
 S/w pt's daughter per [facility]) is aware to D/C potassium and that the medication list was updated.

## 2024-02-08 ENCOUNTER — Telehealth: Payer: Self-pay

## 2024-02-08 NOTE — Telephone Encounter (Signed)
-----   Message from RMA [place] W sent at 02/01/2024  4:40 PM EDT ----- Dr. Tenny Craw asked Dayna to send you a message to get this pt and his wife, in to see her in a couple of weeks.  Thanks!

## 2024-02-08 NOTE — Telephone Encounter (Signed)
 Need to make appt with Dr Tenny Craw early April 2025 when Dr Tenny Craw adds in.

## 2024-02-14 ENCOUNTER — Other Ambulatory Visit: Payer: Self-pay | Admitting: Internal Medicine

## 2024-02-15 NOTE — Telephone Encounter (Signed)
 Left a message for the pt to call back.Marland Kitchen trying to get him and his wife worked in... possibly on separate days but if not will try to work in the morning of 02/21/24 her quarter day.

## 2024-02-18 NOTE — Telephone Encounter (Signed)
 OV 02/21/24 with Dr Tenny Craw

## 2024-02-21 ENCOUNTER — Encounter: Payer: Self-pay | Admitting: Internal Medicine

## 2024-02-21 ENCOUNTER — Ambulatory Visit: Attending: Internal Medicine | Admitting: Internal Medicine

## 2024-02-21 VITALS — BP 152/80 | HR 56 | Ht 68.0 in | Wt 199.0 lb

## 2024-02-21 DIAGNOSIS — I480 Paroxysmal atrial fibrillation: Secondary | ICD-10-CM

## 2024-02-21 NOTE — Progress Notes (Unsigned)
 Cardiology Office Note   Date:  02/24/2024   ID:  [name], DOB [birth year], MRN [id number]  PCP:  Tresa Garter, MD  Cardiologist:   Dietrich Pates, MD   F/u of CAD and atrial fibrillaiton        History of Present Illness: [name] is a 89 y.o. male with a history of CAD, (s/p CABG 2012 LIMA to LAD; SVG to Cx),diabetes, hypertension, nephrolithiasis, hyperlipidemia, and atrial fibrillaiton  He underwent cardioversion in past, reverted to afib, placed on amiodarone Converted to SR then back into afib   .. .  Sept 2019  Echo showed LVEF  40 to 45%   By my review it appeared greater   November 2019  Myoview normal   June 2020  Near syncopal spell Monitor showed no arrhythmias  Symptoms improved/resolved   May 2022 Myoview showed inferior defect consistent with scar and mild periinfarct ischemia   LVEF 40%   Echo showed LVEF 60 to 65% with normal wall motion   COnsistent with soft tissue attenuation and possible subendocardial scar  I saw the pt in clinic in Jan 2024  In March 2025 he was seen by Eugenie Norrie  At that visit he admitted to taking Eliquis only one time per day    Eliquis changed to bid dosing     BP a little high at 140/70  REcomm close follow up       Since seen the pt says he has done OK   He denies dizziness  Breathing is stable   No CP        Current Meds  Medication Sig   albuterol (VENTOLIN HFA) 108 (90 Base) MCG/ACT inhaler TAKE 2 PUFFS BY MOUTH EVERY 6 HOURS AS NEEDED FOR WHEEZE OR SHORTNESS OF BREATH   amiodarone (PACERONE) 200 MG tablet TAKE 1 TABLET (200 MG TOTAL) BY MOUTH DAILY. EXCEPT SATURDAY AND SUNDAY.   apixaban (ELIQUIS) 2.5 MG TABS tablet Take 1 tablet (2.5 mg total) by mouth 2 (two) times daily.   bisacodyl (DULCOLAX) 5 MG EC tablet Take 5 mg by mouth daily as needed for moderate constipation.   cholecalciferol (VITAMIN D) 1000 units tablet Take 2,000 Units by mouth daily.   docusate sodium (COLACE) 100 MG capsule Take 200 mg by  mouth at bedtime.   furosemide (LASIX) 40 MG tablet TAKE 2 TABLETS (80 MG TOTAL) BY MOUTH DAILY. CAN TAKE AN EXTRA 1/2 TABLET BY MOUTH ONCE A WEEK.   gabapentin (NEURONTIN) 100 MG capsule TAKE 1 CAPSULE (100 MG TOTAL) BY MOUTH 3 TIMES A DAY   Lactulose 20 GM/30ML SOLN Take 30-60 mLs (20-40 g total) by mouth 2 (two) times daily as needed (severe constipation).   latanoprost (XALATAN) 0.005 % ophthalmic solution Place 1 drop into both eyes daily.   metFORMIN (GLUCOPHAGE) 500 MG tablet TAKE 1 TABLET BY MOUTH THREE TIMES A DAY   pantoprazole (PROTONIX) 40 MG tablet TAKE 1 TABLET BY MOUTH EVERY DAY   Propylene Glycol 0.6 % SOLN Place 1 drop into the left eye 2 (two) times daily as needed (for dry eyes).   repaglinide (PRANDIN) 2 MG tablet TAKE 1 TABLET BY MOUTH 3 TIMES DAILY BEFORE MEALS   simvastatin (ZOCOR) 10 MG tablet TAKE 1 TABLET BY MOUTH EVERY DAY   vitamin B-12 (CYANOCOBALAMIN) 250 MCG tablet Take 2 tablets (500 mcg total) by mouth daily.   Vitamin D, Ergocalciferol, (DRISDOL) 1.25 MG (50000 UNIT) CAPS capsule TAKE 1 CAPSULE (50,000 UNITS  TOTAL) BY MOUTH EVERY 7 (SEVEN) DAYS     Allergies:   Patient has no known allergies.   Past Medical History:  Diagnosis Date   Acute diastolic CHF (congestive heart failure) (HCC) 08/04/2020   Arthritis    Atrial fibrillation (HCC)    post op; amiodarone and coumadin continued for 3 mos post op   CARCINOMA, SKIN, SQUAMOUS CELL 01/07/2010   Cataract    beginning of cataracts   COLONIC POLYPS, HX OF 04/26/2007   Coronary artery disease    s/p CABG 3/12: L-LAD, S-CFX (Dr. Cornelius Moras); EF 45% at cath prior to CABG   DIABETES MELLITUS, TYPE II 04/26/2007   Hyperlipidemia    HYPERTENSION 04/26/2007   NEPHROLITHIASIS, HX OF 04/26/2007   Nuclear sclerotic cataract of right eye 10/02/2021   Surgery Dr. Zenaida Niece, 06-03-2022 with posterior capsule disruption posterior dislocation lens, disruption of pupil margin superiorly   SBO (small bowel obstruction) (HCC) 10/2015    Partial    Shortness of breath dyspnea    with exertion   Unspecified hearing loss 12/18/2008    Past Surgical History:  Procedure Laterality Date   BACK SURGERY     disectomy   CARDIOVERSION N/A 06/08/2018   Procedure: CARDIOVERSION;  Surgeon: Jodelle Red, MD;  Location: [facility] ENDOSCOPY;  Service: Cardiovascular;  Laterality: N/A;   CARDIOVERSION N/A 07/25/2018   Procedure: CARDIOVERSION;  Surgeon: Pricilla Riffle, MD;  Location: [facility] ENDOSCOPY;  Service: Cardiovascular;  Laterality: N/A;   COLONOSCOPY     CORONARY ARTERY BYPASS GRAFT  01/12/2011   LUMBAR LAMINECTOMY/DECOMPRESSION MICRODISCECTOMY N/A 05/08/2015   Procedure: LUMBAR LAMINECTOMY/DECOMPRESSION MICRODISCECTOMY 1 LEVEL Lumbar four five;  Surgeon: Venita Lick, MD;  Location: [facility] OR;  Service: Orthopedics;  Laterality: N/A;   TOTAL HIP ARTHROPLASTY Left    left on 04/15/07   TYMPANIC MEMBRANE REPAIR  1981     Social History:  The patient  reports that he quit smoking about 43 years ago. His smoking use included cigarettes. He started smoking about 73 years ago. He has a 60 pack-year smoking history. He has never used smokeless tobacco. He reports that he does not drink alcohol and does not use drugs.   Family History:  The patient's family history includes Breast cancer in his sister; Cancer in his brother and sister; Heart disease in his brother and father; Heart failure in his mother.    ROS:  Please see the history of present illness. All other systems are reviewed and  Negative to the above problem except as noted.    PHYSICAL EXAM: VS:  BP (!) 152/80   Pulse (!) 56   Ht 5\' 8"  (1.727 m)   Wt 90.3 kg   BMI 30.26 kg/m   BP on recheck   122/68    On recheck BP  GEN:  obese 89 yo in no acute distress  HEENT: normal  Neck: JVP is normal   Cardiac:  Irreg irreg   No S3   No significant murmurs  Tr LE edema   Respiratory:  clear to auscultation  GI: soft, nontender, obese     EKG   Coarse atrial fib vs atypical  atrial flutter   77 bpm   Occasional PVC  Anterior MI  Echo   04/07/21  E/LatE' elevated but in setting of severe MAC diastolic parameters indeterminate . Left ventricular ejection fraction, by estimation, is 60 to 65%. The left ventricle has normal function. The left ventricle has no regional wall motion abnormalities. Left ventricular diastolic  parameters are indeterminate. 2. Right ventricular systolic function is normal. The right ventricular size is normal. 3. Left atrial size was moderately dilated. 4. Right atrial size was mildly dilated. 5. The mitral valve is degenerative. Mild to moderate mitral valve regurgitation. No evidence of mitral stenosis. Severe mitral annular calcification. 6. The aortic valve is tricuspid. There is mild calcification of the aortic valve. Aortic valve regurgitation is not visualized. Mild aortic valve sclerosis is present, with no evidence of aortic valve stenosis. 7. The inferior vena cava is normal in size with greater than 50% respiratory variability, suggesting right atrial pressure of 3 mmHg.  Myoview  04/03/21  Nuclear stress EF: 40%. The left ventricular ejection fraction is moderately decreased (30-44%). There was no ST segment deviation noted during stress. There is a small defect of severe severity present in the basal inferior, mid inferior and apical inferior location. The defect is partially reversible and is consistent with inferior infarct with peri infarct ischemia. This is a high risk study. Compared to study of 2019, there is a now an inferior defect worse on stress imaging and LVF appears down with focal inferoseptal and inferior wall motion abnormalities Consider 2D echo     Lipid Panel    Component Value Date/Time   CHOL 109 02/01/2024 1654   TRIG 85 02/01/2024 1654   TRIG 78 11/05/2006 1123   HDL 47 02/01/2024 1654   CHOLHDL 2.3 02/01/2024 1654   CHOLHDL 2.5 01/17/2016 1209   VLDL 20 01/17/2016 1209   LDLCALC 45  02/01/2024 1654   LDLDIRECT 52 02/01/2024 1654      Wt Readings from Last 3 Encounters:  02/21/24 90.3 kg  02/01/24 91.2 kg  12/28/23 93.4 kg      ASSESSMENT AND PLAN:  1  CAD   CABG in 2012   Last myoview in 2022 showed scar with mild periinfarct ischemia   Echo shows LVEF normal    PT remains CP free  Follow   2   PAF/flutter   PT in rate controlled atrial flutter today   I think amiodarone is probably helping keep regulated (rate)   I would continue   Keep on Eliquis 2.5 bid   Follow renal function and CBC periodically     3   HTN   BP high on arrival   Pt walked in far from parking lot   Better on recheck   Given age, and hx of dizziness in past  I would not push further   5  HL Continue simvastatin  LDL 52 in  March 2025        6 Hx of dizziness   Pt denies        Follow-up later this year   Current medicines are reviewed at length with the patient today.  The patient does not have concerns regarding medicines.  Signed, Dietrich Pates, MD  [facility] [address] [place], [place], [place]  [address] Phone: [phone]; Fax: [phone]

## 2024-03-02 DIAGNOSIS — H353112 Nonexudative age-related macular degeneration, right eye, intermediate dry stage: Secondary | ICD-10-CM | POA: Diagnosis not present

## 2024-03-06 ENCOUNTER — Other Ambulatory Visit: Payer: Self-pay

## 2024-03-06 DIAGNOSIS — R9389 Abnormal findings on diagnostic imaging of other specified body structures: Secondary | ICD-10-CM

## 2024-03-06 NOTE — Progress Notes (Unsigned)
 Hr chest ct

## 2024-03-10 ENCOUNTER — Ambulatory Visit
Admission: RE | Admit: 2024-03-10 | Discharge: 2024-03-10 | Disposition: A | Discharge status: Home / Self Care | Source: Ambulatory Visit | Attending: Internal Medicine | Admitting: Internal Medicine

## 2024-03-10 DIAGNOSIS — J8410 Pulmonary fibrosis, unspecified: Secondary | ICD-10-CM | POA: Diagnosis not present

## 2024-03-10 DIAGNOSIS — R9389 Abnormal findings on diagnostic imaging of other specified body structures: Secondary | ICD-10-CM

## 2024-03-18 ENCOUNTER — Other Ambulatory Visit: Payer: Self-pay | Admitting: Internal Medicine

## 2024-03-24 ENCOUNTER — Encounter: Payer: Self-pay | Admitting: Internal Medicine

## 2024-03-24 NOTE — Telephone Encounter (Signed)
 CT Chest High Resolution: Result Notes   John Haggard, MD 03/20/2024  5:19 PM EDT     CT of chest shows some fibrosis of the lungs I would recomm stopping amidarone. He should have referral to pulmonary clinic He should be set up for lung function tests I would recomm check a [facility] Left a brief VM but I am not sure if mailbox really taking    Attempted to call patient, no answer left message requesting a call back.

## 2024-03-27 ENCOUNTER — Other Ambulatory Visit: Payer: Self-pay

## 2024-03-27 ENCOUNTER — Ambulatory Visit: Payer: Medicare Other | Admitting: Internal Medicine

## 2024-03-27 ENCOUNTER — Encounter (HOSPITAL_BASED_OUTPATIENT_CLINIC_OR_DEPARTMENT_OTHER): Payer: Self-pay

## 2024-03-27 DIAGNOSIS — R9389 Abnormal findings on diagnostic imaging of other specified body structures: Secondary | ICD-10-CM

## 2024-03-27 DIAGNOSIS — J8410 Pulmonary fibrosis, unspecified: Secondary | ICD-10-CM

## 2024-03-27 NOTE — Telephone Encounter (Signed)
 I spoke with the pt and he verbalized understanding and will have labs this week... and will let me know when he hears back from the Pulmonary ofifce.

## 2024-04-01 DIAGNOSIS — H353112 Nonexudative age-related macular degeneration, right eye, intermediate dry stage: Secondary | ICD-10-CM | POA: Diagnosis not present

## 2024-04-14 ENCOUNTER — Encounter: Payer: Self-pay | Admitting: Internal Medicine

## 2024-04-14 ENCOUNTER — Ambulatory Visit: Admitting: Internal Medicine

## 2024-04-14 VITALS — BP 118/70 | HR 62 | Temp 98.1°F | Ht 68.0 in | Wt 197.0 lb

## 2024-04-14 DIAGNOSIS — M47816 Spondylosis without myelopathy or radiculopathy, lumbar region: Secondary | ICD-10-CM

## 2024-04-14 DIAGNOSIS — E538 Deficiency of other specified B group vitamins: Secondary | ICD-10-CM | POA: Diagnosis not present

## 2024-04-14 DIAGNOSIS — R600 Localized edema: Secondary | ICD-10-CM

## 2024-04-14 DIAGNOSIS — E1159 Type 2 diabetes mellitus with other circulatory complications: Secondary | ICD-10-CM | POA: Diagnosis not present

## 2024-04-14 DIAGNOSIS — I10 Essential (primary) hypertension: Secondary | ICD-10-CM | POA: Diagnosis not present

## 2024-04-14 DIAGNOSIS — Z7984 Long term (current) use of oral hypoglycemic drugs: Secondary | ICD-10-CM

## 2024-04-14 LAB — COMPREHENSIVE METABOLIC PANEL WITH GFR
ALT: 15 U/L (ref 0–53)
AST: 20 U/L (ref 0–37)
Albumin: 4.2 g/dL (ref 3.5–5.2)
Alkaline Phosphatase: 55 U/L (ref 39–117)
BUN: 23 mg/dL (ref 6–23)
CO2: 27 meq/L (ref 19–32)
Calcium: 9.6 mg/dL (ref 8.4–10.5)
Chloride: 103 meq/L (ref 96–112)
Creatinine, Ser: 1.51 mg/dL — ABNORMAL HIGH (ref 0.40–1.50)
GFR: 41.35 mL/min — ABNORMAL LOW (ref >60.00)
Glucose, Bld: 112 mg/dL — ABNORMAL HIGH (ref 70–99)
Potassium: 3.5 meq/L (ref 3.5–5.1)
Sodium: 140 meq/L (ref 135–145)
Total Bilirubin: 0.8 mg/dL (ref 0.2–1.2)
Total Protein: 7.3 g/dL (ref 6.0–8.3)

## 2024-04-14 LAB — HEMOGLOBIN A1C: Hgb A1c MFr Bld: 6.4 % (ref 4.6–6.5)

## 2024-04-14 NOTE — Assessment & Plan Note (Signed)
Continue on Toprol, Furosemide

## 2024-04-14 NOTE — Assessment & Plan Note (Signed)
Start using a walker, cane

## 2024-04-14 NOTE — Assessment & Plan Note (Signed)
 On B12

## 2024-04-14 NOTE — Progress Notes (Signed)
 Subjective:  Patient ID: [name], male    DOB: 27-Jun-1935  Age: 89 y.o. MRN: [id number]  CC: Medical Management of Chronic Issues (3 MNTH F/U)   HPI KORREY SCHLEICHER presents for LBP, DM, HTN  Outpatient Medications Prior to Visit  Medication Sig Dispense Refill   Accu-Chek Softclix Lancets lancets Use to check blood sugar daily DX:E11.59 100 each 3   albuterol  (VENTOLIN  HFA) 108 (90 Base) MCG/ACT inhaler TAKE 2 PUFFS BY MOUTH EVERY 6 HOURS AS NEEDED FOR WHEEZE OR SHORTNESS OF BREATH 6.7 each 2   apixaban  (ELIQUIS ) 2.5 MG TABS tablet Take 1 tablet (2.5 mg total) by mouth 2 (two) times daily. 60 tablet 11   bisacodyl  (DULCOLAX) 5 MG EC tablet Take 5 mg by mouth daily as needed for moderate constipation.     Blood Glucose Monitoring Suppl (ACCU-CHEK AVIVA PLUS) w/Device KIT Use to check blood sugar daily DX:E11.59 1 kit 0   cholecalciferol  (VITAMIN D ) 1000 units tablet Take 2,000 Units by mouth daily.     docusate sodium  (COLACE) 100 MG capsule Take 200 mg by mouth at bedtime.     ferrous sulfate  325 (65 FE) MG tablet Take 1 tablet (325 mg total) by mouth 2 (two) times daily with a meal. 60 tablet 6   furosemide  (LASIX ) 40 MG tablet TAKE 2 TABLETS (80 MG TOTAL) BY MOUTH DAILY. CAN TAKE AN EXTRA 1/2 TABLET BY MOUTH ONCE A WEEK. 180 tablet 0   gabapentin  (NEURONTIN ) 100 MG capsule TAKE 1 CAPSULE (100 MG TOTAL) BY MOUTH 3 TIMES A DAY 270 capsule 3   glucose blood (ACCU-CHEK AVIVA PLUS) test strip Use to check blood sugar daily DX:E11.59 100 each 3   Lactulose  20 GM/30ML SOLN Take 30-60 mLs (20-40 g total) by mouth 2 (two) times daily as needed (severe constipation). 946 mL 3   latanoprost  (XALATAN ) 0.005 % ophthalmic solution Place 1 drop into both eyes daily.     metFORMIN  (GLUCOPHAGE ) 500 MG tablet TAKE 1 TABLET BY MOUTH THREE TIMES A DAY 270 tablet 3   pantoprazole  (PROTONIX ) 40 MG tablet TAKE 1 TABLET BY MOUTH EVERY DAY 90 tablet 3   Propylene Glycol 0.6 % SOLN Place 1 drop  into the left eye 2 (two) times daily as needed (for dry eyes).     repaglinide  (PRANDIN ) 2 MG tablet TAKE 1 TABLET BY MOUTH 3 TIMES DAILY BEFORE MEALS 270 tablet 3   simvastatin  (ZOCOR ) 10 MG tablet TAKE 1 TABLET BY MOUTH EVERY DAY 90 tablet 0   Tiotropium Bromide-Olodaterol (STIOLTO RESPIMAT ) 2.5-2.5 MCG/ACT AERS Inhale 2 puffs into the lungs daily. 4 g 0   vitamin B-12 (CYANOCOBALAMIN ) 250 MCG tablet Take 2 tablets (500 mcg total) by mouth daily.     Vitamin D , Ergocalciferol , (DRISDOL ) 1.25 MG (50000 UNIT) CAPS capsule TAKE 1 CAPSULE (50,000 UNITS TOTAL) BY MOUTH EVERY 7 (SEVEN) DAYS 5 capsule 12   No facility-administered medications prior to visit.    ROS: Review of Systems  Constitutional:  Negative for appetite change, fatigue and unexpected weight change.  HENT:  Negative for congestion, nosebleeds, sneezing, sore throat and trouble swallowing.   Eyes:  Negative for itching and visual disturbance.  Respiratory:  Negative for cough.   Cardiovascular:  Negative for chest pain, palpitations and leg swelling.  Gastrointestinal:  Negative for abdominal distention, blood in stool, diarrhea and nausea.  Genitourinary:  Negative for frequency and hematuria.  Musculoskeletal:  Positive for arthralgias and gait problem. Negative for back  pain, joint swelling and neck pain.  Skin:  Negative for rash.  Neurological:  Negative for dizziness, tremors, speech difficulty and weakness.  Psychiatric/Behavioral:  Negative for agitation, dysphoric mood, sleep disturbance and suicidal ideas. The patient is not nervous/anxious.     Objective:  BP 118/70   Pulse 62   Temp 98.1 F (36.7 C) (Oral)   Ht 5\' 8"  (1.727 m)   Wt 197 lb (89.4 kg)   SpO2 92%   BMI 29.95 kg/m   BP Readings from Last 3 Encounters:  04/14/24 118/70  02/21/24 (!) 152/80  02/01/24 (!) 140/70    Wt Readings from Last 3 Encounters:  04/14/24 197 lb (89.4 kg)  02/21/24 199 lb (90.3 kg)  02/01/24 201 lb (91.2 kg)     Physical Exam Constitutional:      General: He is not in acute distress.    Appearance: He is well-developed.     Comments: NAD  Eyes:     Conjunctiva/sclera: Conjunctivae normal.     Pupils: Pupils are equal, round, and reactive to light.  Neck:     Thyroid : No thyromegaly.     Vascular: No JVD.  Cardiovascular:     Rate and Rhythm: Normal rate and regular rhythm.     Heart sounds: Normal heart sounds. No murmur heard.    No friction rub. No gallop.  Pulmonary:     Effort: Pulmonary effort is normal. No respiratory distress.     Breath sounds: Normal breath sounds. No wheezing or rales.  Chest:     Chest wall: No tenderness.  Abdominal:     General: Bowel sounds are normal. There is no distension.     Palpations: Abdomen is soft. There is no mass.     Tenderness: There is no abdominal tenderness. There is no guarding or rebound.  Musculoskeletal:        General: Tenderness present. Normal range of motion.     Cervical back: Normal range of motion.     Right lower leg: No edema.     Left lower leg: No edema.  Lymphadenopathy:     Cervical: No cervical adenopathy.  Skin:    General: Skin is warm and dry.     Findings: No rash.  Neurological:     Mental Status: He is alert and oriented to person, place, and time. Mental status is at baseline.     Cranial Nerves: No cranial nerve deficit.     Motor: No abnormal muscle tone.     Coordination: Coordination normal.     Gait: Gait abnormal.     Deep Tendon Reflexes: Reflexes are normal and symmetric.  Psychiatric:        Behavior: Behavior normal.        Thought Content: Thought content normal.        Judgment: Judgment normal.    LS spine w/pain Antalgic gait  Lab Results  Component Value Date   WBC 7.8 02/01/2024   HGB 14.0 02/01/2024   HCT 44.0 02/01/2024   PLT 276 02/01/2024   GLUCOSE 97 02/01/2024   CHOL 109 02/01/2024   TRIG 85 02/01/2024   HDL 47 02/01/2024   LDLDIRECT 52 02/01/2024   LDLCALC 45  02/01/2024   ALT 15 02/01/2024   AST 21 02/01/2024   NA 143 02/01/2024   K 5.2 02/02/2024   CL 105 02/01/2024   CREATININE 1.56 (H) 02/01/2024   BUN 25 02/01/2024   CO2 22 02/01/2024   TSH 9.700 (H)  02/01/2024   PSA 1.18 07/02/2017   INR 2.3 03/03/2012   HGBA1C 6.3 12/28/2023   MICROALBUR 0.7 04/04/2018    CT Chest High Resolution Result Date: 03/17/2024 CLINICAL DATA:  Chronic shortness of breath with exertion. EXAM: CT CHEST WITHOUT CONTRAST TECHNIQUE: Multidetector CT imaging of the chest was performed following the standard protocol without intravenous contrast. High resolution imaging of the lungs, as well as inspiratory and expiratory imaging, was performed. RADIATION DOSE REDUCTION: This exam was performed according to the departmental dose-optimization program which includes automated exposure control, adjustment of the mA and/or kV according to patient size and/or use of iterative reconstruction technique. COMPARISON:  06/06/2020. FINDINGS: Cardiovascular: Atherosclerotic calcification of the aorta and aortic valve. Enlarged pulmonic trunk and heart. No pericardial effusion. Mediastinum/Nodes: No pathologically enlarged mediastinal or axillary lymph nodes. Hilar regions are difficult to definitively evaluate without IV contrast. Esophagus is grossly unremarkable. Lungs/Pleura: Peripheral and basilar predominant subpleural reticulation, coarsened ground-glass and traction bronchiectasis/bronchiolectasis, progressive from 06/06/2020. Possible single layer honeycombing in the lateral right middle lobe and lateral right lower lobe. No pleural fluid. Minimal adherent debris in the airway. Mild air trapping. Upper Abdomen: There may be a small right renal sinus cyst. No specific follow-up necessary. Visualized portions of the liver, gallbladder, adrenal glands, kidneys, spleen, pancreas, stomach and bowel are otherwise grossly unremarkable. No upper abdominal adenopathy. Musculoskeletal:  Degenerative changes in the spine. IMPRESSION: 1. Pulmonary parenchymal pattern of fibrosis, as detailed above, progressive from 06/06/2020. Findings are categorized as probable UIP per consensus guidelines: Diagnosis of Idiopathic Pulmonary Fibrosis: An Official ATS/ERS/JRS/ALAT Clinical Practice Guideline. Am Annie Barton Crit Care Med Vol 198, Iss 5, [phone], Jul 17 2017. 2. Mild air trapping is indicative of small airways disease. 3.  Aortic atherosclerosis (ICD10-I70.0). 4. Enlarged pulmonic trunk, indicative of pulmonary arterial hypertension. Electronically Signed   By: Shearon Denis M.D.   On: 03/17/2024 13:28    Assessment & Plan:   Problem List Items Addressed This Visit     Diabetes mellitus, type 2 (HCC) - Primary (Chronic)   Cont on Metformin , Prandin  Metformin  500 mg tid  CMET, A1c q 3 mo      Relevant Orders   Hemoglobin A1c   Comprehensive metabolic panel with GFR   Essential hypertension   Continue on Toprol , Furosemide       B12 deficiency   On B12      Relevant Orders   Comprehensive metabolic panel with GFR   Lumbar spondylosis   Start using a walker, cane      Bilateral leg edema   Resolved LLE swells occasionally         No orders of the defined types were placed in this encounter.     Follow-up: Return in about 3 months (around 07/15/2024).  Anitra Barn, MD

## 2024-04-14 NOTE — Assessment & Plan Note (Signed)
 Resolved LLE swells occasionally

## 2024-04-14 NOTE — Assessment & Plan Note (Signed)
 Cont on Metformin, Prandin Metformin 500 mg tid  CMET, A1c q 3 mo

## 2024-04-16 ENCOUNTER — Other Ambulatory Visit: Payer: Self-pay | Admitting: Internal Medicine

## 2024-04-18 ENCOUNTER — Ambulatory Visit: Payer: Self-pay | Admitting: Internal Medicine

## 2024-05-01 DIAGNOSIS — H353122 Nonexudative age-related macular degeneration, left eye, intermediate dry stage: Secondary | ICD-10-CM | POA: Diagnosis not present

## 2024-05-02 ENCOUNTER — Encounter: Payer: Self-pay | Admitting: Internal Medicine

## 2024-05-02 ENCOUNTER — Other Ambulatory Visit: Payer: Self-pay | Admitting: Internal Medicine

## 2024-05-15 ENCOUNTER — Ambulatory Visit (INDEPENDENT_AMBULATORY_CARE_PROVIDER_SITE_OTHER): Admitting: Internal Medicine

## 2024-05-15 ENCOUNTER — Encounter: Payer: Self-pay | Admitting: Internal Medicine

## 2024-05-15 VITALS — BP 128/60 | HR 75 | Temp 97.7°F | Ht 68.0 in | Wt 201.8 lb

## 2024-05-15 DIAGNOSIS — Z7984 Long term (current) use of oral hypoglycemic drugs: Secondary | ICD-10-CM

## 2024-05-15 DIAGNOSIS — I152 Hypertension secondary to endocrine disorders: Secondary | ICD-10-CM | POA: Diagnosis not present

## 2024-05-15 DIAGNOSIS — L97909 Non-pressure chronic ulcer of unspecified part of unspecified lower leg with unspecified severity: Secondary | ICD-10-CM | POA: Insufficient documentation

## 2024-05-15 DIAGNOSIS — I872 Venous insufficiency (chronic) (peripheral): Secondary | ICD-10-CM | POA: Diagnosis not present

## 2024-05-15 DIAGNOSIS — L97821 Non-pressure chronic ulcer of other part of left lower leg limited to breakdown of skin: Secondary | ICD-10-CM | POA: Diagnosis not present

## 2024-05-15 DIAGNOSIS — E1159 Type 2 diabetes mellitus with other circulatory complications: Secondary | ICD-10-CM | POA: Diagnosis not present

## 2024-05-15 MED ORDER — MUPIROCIN 2 % EX OINT: TOPICAL_OINTMENT | CUTANEOUS | 0 refills | Status: AC

## 2024-05-15 NOTE — Assessment & Plan Note (Signed)
 Cont on Metformin, Prandin Metformin 500 mg tid  CMET, A1c q 3 mo

## 2024-05-15 NOTE — Assessment & Plan Note (Signed)
 Both lower extremities are involved.  It is painful for [name] to wear compression socks. He will continue with the diuretic.  Elevate legs

## 2024-05-15 NOTE — Assessment & Plan Note (Signed)
 Cont on Metoprolol  Cont w/Furosemide. Zaroxolyn - TAKE ONE TABLET 30 MINUTES PRIOR TO TAKING YOUR LASIX, once every week.

## 2024-05-15 NOTE — Progress Notes (Signed)
 Subjective:  Patient ID: [name], male    DOB: October 22, 1935  Age: 89 y.o. MRN: [id number]  CC: Medical Management of Chronic Issues (Discuss blister on leg (noted a couple months back it had appeared and then subsided, back again and currently has some edema and small wound))   HPI [name] presents for leg wound and blister on the right leg on several days duration.  It has been leaking fluid, yellow in color. Follow-up on diabetes, leg swelling, hypertension  Outpatient Medications Prior to Visit  Medication Sig Dispense Refill   Accu-Chek Softclix Lancets lancets Use to check blood sugar daily DX:E11.59 100 each 3   albuterol  (VENTOLIN  HFA) 108 (90 Base) MCG/ACT inhaler TAKE 2 PUFFS BY MOUTH EVERY 6 HOURS AS NEEDED FOR WHEEZE OR SHORTNESS OF BREATH 6.7 each 2   apixaban  (ELIQUIS ) 2.5 MG TABS tablet Take 1 tablet (2.5 mg total) by mouth 2 (two) times daily. 60 tablet 11   bisacodyl  (DULCOLAX) 5 MG EC tablet Take 5 mg by mouth daily as needed for moderate constipation.     Blood Glucose Monitoring Suppl (ACCU-CHEK AVIVA PLUS) w/Device KIT Use to check blood sugar daily DX:E11.59 1 kit 0   cholecalciferol  (VITAMIN D ) 1000 units tablet Take 2,000 Units by mouth daily.     docusate sodium  (COLACE) 100 MG capsule Take 200 mg by mouth at bedtime.     ferrous sulfate  325 (65 FE) MG tablet Take 1 tablet (325 mg total) by mouth 2 (two) times daily with a meal. 60 tablet 6   furosemide  (LASIX ) 40 MG tablet TAKE 2 TABLETS (80 MG TOTAL) BY MOUTH DAILY. CAN TAKE AN EXTRA 1/2 TABLET BY MOUTH ONCE A WEEK. 180 tablet 2   gabapentin  (NEURONTIN ) 100 MG capsule TAKE 1 CAPSULE (100 MG TOTAL) BY MOUTH 3 TIMES A DAY 270 capsule 3   glucose blood (ACCU-CHEK AVIVA PLUS) test strip Use to check blood sugar daily DX:E11.59 100 each 3   Lactulose  20 GM/30ML SOLN Take 30-60 mLs (20-40 g total) by mouth 2 (two) times daily as needed (severe constipation). 946 mL 3   latanoprost  (XALATAN ) 0.005 %  ophthalmic solution Place 1 drop into both eyes daily.     metFORMIN  (GLUCOPHAGE ) 500 MG tablet TAKE 1 TABLET BY MOUTH THREE TIMES A DAY 270 tablet 3   pantoprazole  (PROTONIX ) 40 MG tablet TAKE 1 TABLET BY MOUTH EVERY DAY 90 tablet 3   Propylene Glycol 0.6 % SOLN Place 1 drop into the left eye 2 (two) times daily as needed (for dry eyes).     repaglinide  (PRANDIN ) 2 MG tablet TAKE 1 TABLET BY MOUTH 3 TIMES DAILY BEFORE MEALS 270 tablet 3   simvastatin  (ZOCOR ) 10 MG tablet Take 1 tablet (10 mg total) by mouth daily. 90 tablet 3   Tiotropium Bromide-Olodaterol (STIOLTO RESPIMAT ) 2.5-2.5 MCG/ACT AERS Inhale 2 puffs into the lungs daily. 4 g 0   vitamin B-12 (CYANOCOBALAMIN ) 250 MCG tablet Take 2 tablets (500 mcg total) by mouth daily.     Vitamin D , Ergocalciferol , (DRISDOL ) 1.25 MG (50000 UNIT) CAPS capsule TAKE 1 CAPSULE (50,000 UNITS TOTAL) BY MOUTH EVERY 7 (SEVEN) DAYS 5 capsule 12   No facility-administered medications prior to visit.    ROS: Review of Systems  Constitutional:  Positive for fatigue. Negative for appetite change and unexpected weight change.  HENT:  Negative for congestion, nosebleeds, sneezing, sore throat and trouble swallowing.   Eyes:  Negative for itching and visual disturbance.  Respiratory:  Positive for shortness of breath. Negative for cough and choking.   Cardiovascular:  Negative for chest pain, palpitations and leg swelling.  Gastrointestinal:  Negative for abdominal distention, blood in stool, diarrhea and nausea.  Genitourinary:  Negative for frequency and hematuria.  Musculoskeletal:  Positive for arthralgias, back pain and gait problem. Negative for joint swelling and neck pain.  Skin:  Positive for color change and wound. Negative for rash.  Neurological:  Positive for weakness. Negative for dizziness, tremors and speech difficulty.  Psychiatric/Behavioral:  Negative for agitation, dysphoric mood, sleep disturbance and suicidal ideas. The patient is not  nervous/anxious.     Objective:  BP 128/60   Pulse 75   Temp 97.7 F (36.5 C) (Oral)   Ht 5' 8 (1.727 m)   Wt 201 lb 12.8 oz (91.5 kg)   SpO2 95%   BMI 30.68 kg/m   BP Readings from Last 3 Encounters:  05/15/24 128/60  04/14/24 118/70  02/21/24 (!) 152/80    Wt Readings from Last 3 Encounters:  05/15/24 201 lb 12.8 oz (91.5 kg)  04/14/24 197 lb (89.4 kg)  02/21/24 199 lb (90.3 kg)    Physical Exam Constitutional:      General: He is not in acute distress.    Appearance: He is well-developed. He is obese. He is not diaphoretic.     Comments: NAD   Eyes:     Conjunctiva/sclera: Conjunctivae normal.     Pupils: Pupils are equal, round, and reactive to light.   Neck:     Thyroid : No thyromegaly.     Vascular: No JVD.   Cardiovascular:     Rate and Rhythm: Normal rate and regular rhythm.     Heart sounds: Normal heart sounds. No murmur heard.    No friction rub. No gallop.  Pulmonary:     Effort: Pulmonary effort is normal. No respiratory distress.     Breath sounds: Normal breath sounds. No wheezing or rales.  Chest:     Chest wall: No tenderness.  Abdominal:     General: Bowel sounds are normal. There is no distension.     Palpations: Abdomen is soft. There is no mass.     Tenderness: There is no abdominal tenderness. There is no guarding or rebound.   Musculoskeletal:        General: No tenderness. Normal range of motion.     Cervical back: Normal range of motion.     Right lower leg: Edema present.     Left lower leg: Edema present.  Lymphadenopathy:     Cervical: No cervical adenopathy.   Skin:    General: Skin is warm and dry.     Findings: Rash present.   Neurological:     Mental Status: He is alert and oriented to person, place, and time.     Cranial Nerves: No cranial nerve deficit.     Motor: No abnormal muscle tone.     Coordination: Coordination normal.     Gait: Gait normal.     Deep Tendon Reflexes: Reflexes are normal and symmetric.    Psychiatric:        Behavior: Behavior normal.        Thought Content: Thought content normal.        Judgment: Judgment normal.   Right leg wound without signs of infection Slight erythema over both lower (distal) shins Trace to 1+ lower extremity edema bilaterally   A total time of 45 minutes was spent preparing  to see the patient, reviewing tests, x-rays, operative reports and other medical records.  Also, obtaining history and performing comprehensive physical exam.  Additionally, counseling the patient regarding the above listed issues -chronic venous insufficiency, stasis ulcers, edema treatment.   Finally, documenting clinical information in the health records, coordination of care, educating the patient.  And debriding/dressing wound ulcer on the leg.     Lab Results  Component Value Date   WBC 7.8 02/01/2024   HGB 14.0 02/01/2024   HCT 44.0 02/01/2024   PLT 276 02/01/2024   GLUCOSE 112 (H) 04/14/2024   CHOL 109 02/01/2024   TRIG 85 02/01/2024   HDL 47 02/01/2024   LDLDIRECT 52 02/01/2024   LDLCALC 45 02/01/2024   ALT 15 04/14/2024   AST 20 04/14/2024   NA 140 04/14/2024   K 3.5 04/14/2024   CL 103 04/14/2024   CREATININE 1.51 (H) 04/14/2024   BUN 23 04/14/2024   CO2 27 04/14/2024   TSH 9.700 (H) 02/01/2024   PSA 1.18 07/02/2017   INR 2.3 03/03/2012   HGBA1C 6.4 04/14/2024    CT Chest High Resolution Result Date: 03/17/2024 CLINICAL DATA:  Chronic shortness of breath with exertion. EXAM: CT CHEST WITHOUT CONTRAST TECHNIQUE: Multidetector CT imaging of the chest was performed following the standard protocol without intravenous contrast. High resolution imaging of the lungs, as well as inspiratory and expiratory imaging, was performed. RADIATION DOSE REDUCTION: This exam was performed according to the departmental dose-optimization program which includes automated exposure control, adjustment of the mA and/or kV according to patient size and/or use of iterative  reconstruction technique. COMPARISON:  06/06/2020. FINDINGS: Cardiovascular: Atherosclerotic calcification of the aorta and aortic valve. Enlarged pulmonic trunk and heart. No pericardial effusion. Mediastinum/Nodes: No pathologically enlarged mediastinal or axillary lymph nodes. Hilar regions are difficult to definitively evaluate without IV contrast. Esophagus is grossly unremarkable. Lungs/Pleura: Peripheral and basilar predominant subpleural reticulation, coarsened ground-glass and traction bronchiectasis/bronchiolectasis, progressive from 06/06/2020. Possible single layer honeycombing in the lateral right middle lobe and lateral right lower lobe. No pleural fluid. Minimal adherent debris in the airway. Mild air trapping. Upper Abdomen: There may be a small right renal sinus cyst. No specific follow-up necessary. Visualized portions of the liver, gallbladder, adrenal glands, kidneys, spleen, pancreas, stomach and bowel are otherwise grossly unremarkable. No upper abdominal adenopathy. Musculoskeletal: Degenerative changes in the spine. IMPRESSION: 1. Pulmonary parenchymal pattern of fibrosis, as detailed above, progressive from 06/06/2020. Findings are categorized as probable UIP per consensus guidelines: Diagnosis of Idiopathic Pulmonary Fibrosis: An Official ATS/ERS/JRS/ALAT Clinical Practice Guideline. Am JINNY [employer] Crit Care Med Vol 198, Iss 5, [phone], Jul 17 2017. 2. Mild air trapping is indicative of small airways disease. 3.  Aortic atherosclerosis (ICD10-I70.0). 4. Enlarged pulmonic trunk, indicative of pulmonary arterial hypertension. Electronically Signed   By: Newell Eke M.D.   On: 03/17/2024 13:28    Assessment & Plan:   Problem List Items Addressed This Visit     Diabetes mellitus, type 2 (HCC) (Chronic)   Cont on Metformin , Prandin  Metformin  500 mg tid  CMET, A1c q 3 mo      Hypertension associated with diabetes (HCC) (Chronic)   Cont on Metoprolol   Cont w/Furosemide .  Zaroxolyn  - TAKE ONE TABLET 30 MINUTES PRIOR TO TAKING YOUR LASIX , once every week.       Stasis ulcer (HCC) - Primary   Pt declined wound clinic Stasis ulcer on the right lower leg- see picture Blister roof was debrided with sterile scissors and forceps.  The wound was treated with Bactroban  ointment,Telfa pad, gauze Coban wrap Elevate legs, dressing changes      Chronic venous insufficiency   Both lower extremities are involved.  It is painful for Carmon to wear compression socks. He will continue with the diuretic.  Elevate legs          Meds ordered this encounter  Medications   mupirocin  ointment (BACTROBAN ) 2 %    Sig: On leg wound w/dressing change qd or bid    Dispense:  30 g    Refill:  0      Follow-up: Return in about 4 weeks (around 06/12/2024) for a follow-up visit.  Marolyn Noel, MD

## 2024-05-15 NOTE — Assessment & Plan Note (Addendum)
 Pt declined wound clinic Stasis ulcer on the right lower leg- see picture Blister roof was debrided with sterile scissors and forceps.  The wound was treated with Bactroban  ointment,Telfa pad, gauze Coban wrap Elevate legs, dressing changes

## 2024-05-30 DIAGNOSIS — M25511 Pain in right shoulder: Secondary | ICD-10-CM | POA: Diagnosis not present

## 2024-05-30 DIAGNOSIS — M25512 Pain in left shoulder: Secondary | ICD-10-CM | POA: Diagnosis not present

## 2024-05-31 DIAGNOSIS — H353122 Nonexudative age-related macular degeneration, left eye, intermediate dry stage: Secondary | ICD-10-CM | POA: Diagnosis not present

## 2024-06-01 DIAGNOSIS — H353122 Nonexudative age-related macular degeneration, left eye, intermediate dry stage: Secondary | ICD-10-CM | POA: Diagnosis not present

## 2024-06-01 DIAGNOSIS — H43812 Vitreous degeneration, left eye: Secondary | ICD-10-CM | POA: Diagnosis not present

## 2024-06-01 DIAGNOSIS — H35372 Puckering of macula, left eye: Secondary | ICD-10-CM | POA: Diagnosis not present

## 2024-06-01 DIAGNOSIS — H59811 Chorioretinal scars after surgery for detachment, right eye: Secondary | ICD-10-CM | POA: Diagnosis not present

## 2024-06-01 DIAGNOSIS — H353211 Exudative age-related macular degeneration, right eye, with active choroidal neovascularization: Secondary | ICD-10-CM | POA: Diagnosis not present

## 2024-06-30 DIAGNOSIS — H353122 Nonexudative age-related macular degeneration, left eye, intermediate dry stage: Secondary | ICD-10-CM | POA: Diagnosis not present

## 2024-07-18 ENCOUNTER — Ambulatory Visit: Admitting: Internal Medicine

## 2024-07-18 ENCOUNTER — Ambulatory Visit: Admitting: Pulmonary Disease

## 2024-07-18 ENCOUNTER — Encounter: Payer: Self-pay | Admitting: Internal Medicine

## 2024-07-18 VITALS — BP 118/70 | HR 63 | Temp 98.0°F | Ht 68.0 in | Wt 204.0 lb

## 2024-07-18 DIAGNOSIS — E538 Deficiency of other specified B group vitamins: Secondary | ICD-10-CM | POA: Diagnosis not present

## 2024-07-18 DIAGNOSIS — E1142 Type 2 diabetes mellitus with diabetic polyneuropathy: Secondary | ICD-10-CM

## 2024-07-18 DIAGNOSIS — N183 Chronic kidney disease, stage 3 unspecified: Secondary | ICD-10-CM

## 2024-07-18 DIAGNOSIS — I10 Essential (primary) hypertension: Secondary | ICD-10-CM

## 2024-07-18 DIAGNOSIS — J9601 Acute respiratory failure with hypoxia: Secondary | ICD-10-CM | POA: Diagnosis not present

## 2024-07-18 DIAGNOSIS — E66812 Obesity, class 2: Secondary | ICD-10-CM

## 2024-07-18 DIAGNOSIS — M545 Low back pain, unspecified: Secondary | ICD-10-CM

## 2024-07-18 DIAGNOSIS — H353211 Exudative age-related macular degeneration, right eye, with active choroidal neovascularization: Secondary | ICD-10-CM

## 2024-07-18 DIAGNOSIS — G8929 Other chronic pain: Secondary | ICD-10-CM | POA: Diagnosis not present

## 2024-07-18 DIAGNOSIS — Z6835 Body mass index (BMI) 35.0-35.9, adult: Secondary | ICD-10-CM

## 2024-07-18 LAB — COMPREHENSIVE METABOLIC PANEL WITH GFR
ALT: 17 U/L (ref 0–53)
AST: 19 U/L (ref 0–37)
Albumin: 4 g/dL (ref 3.5–5.2)
Alkaline Phosphatase: 48 U/L (ref 39–117)
BUN: 20 mg/dL (ref 6–23)
CO2: 28 meq/L (ref 19–32)
Calcium: 9.1 mg/dL (ref 8.4–10.5)
Chloride: 103 meq/L (ref 96–112)
Creatinine, Ser: 1.64 mg/dL — ABNORMAL HIGH (ref 0.40–1.50)
GFR: 37.38 mL/min — ABNORMAL LOW (ref >60.00)
Glucose, Bld: 109 mg/dL — ABNORMAL HIGH (ref 70–99)
Potassium: 4.3 meq/L (ref 3.5–5.1)
Sodium: 141 meq/L (ref 135–145)
Total Bilirubin: 0.8 mg/dL (ref 0.2–1.2)
Total Protein: 7.1 g/dL (ref 6.0–8.3)

## 2024-07-18 LAB — VITAMIN B12: Vitamin B-12: 868 pg/mL (ref 211–911)

## 2024-07-18 LAB — HEMOGLOBIN A1C: Hgb A1c MFr Bld: 7.1 % — ABNORMAL HIGH (ref 4.6–6.5)

## 2024-07-18 MED ORDER — GABAPENTIN 300 MG PO CAPS
300.0000 mg | ORAL_CAPSULE | Freq: Two times a day (BID) | ORAL | 1 refills | Status: AC
Start: 2024-07-18 — End: ?

## 2024-07-18 NOTE — Assessment & Plan Note (Signed)
 Increase Gabapentin to 300 mg bid.

## 2024-07-18 NOTE — Assessment & Plan Note (Signed)
 Monitor GFR

## 2024-07-18 NOTE — Assessment & Plan Note (Signed)
 On B12

## 2024-07-18 NOTE — Progress Notes (Signed)
 Subjective:  Patient ID: [name], male    DOB: [birth year]  Age: 89 y.o. MRN: [id number]  CC: Follow-up (22mo; no concerns)   HPI MAKANA ROSTAD presents for DM, HTN, CAD, BP F/u neuropathy  Outpatient Medications Prior to Visit  Medication Sig Dispense Refill   Accu-Chek Softclix Lancets lancets Use to check blood sugar daily DX:E11.59 100 each 3   albuterol  (VENTOLIN  HFA) 108 (90 Base) MCG/ACT inhaler TAKE 2 PUFFS BY MOUTH EVERY 6 HOURS AS NEEDED FOR WHEEZE OR SHORTNESS OF BREATH 6.7 each 2   apixaban  (ELIQUIS ) 2.5 MG TABS tablet Take 1 tablet (2.5 mg total) by mouth 2 (two) times daily. 60 tablet 11   bisacodyl  (DULCOLAX) 5 MG EC tablet Take 5 mg by mouth daily as needed for moderate constipation.     Blood Glucose Monitoring Suppl (ACCU-CHEK AVIVA PLUS) w/Device KIT Use to check blood sugar daily DX:E11.59 1 kit 0   cholecalciferol  (VITAMIN D ) 1000 units tablet Take 2,000 Units by mouth daily.     docusate sodium  (COLACE) 100 MG capsule Take 200 mg by mouth at bedtime.     ferrous sulfate  325 (65 FE) MG tablet Take 1 tablet (325 mg total) by mouth 2 (two) times daily with a meal. 60 tablet 6   furosemide  (LASIX ) 40 MG tablet TAKE 2 TABLETS (80 MG TOTAL) BY MOUTH DAILY. CAN TAKE AN EXTRA 1/2 TABLET BY MOUTH ONCE A WEEK. 180 tablet 2   glucose blood (ACCU-CHEK AVIVA PLUS) test strip Use to check blood sugar daily DX:E11.59 100 each 3   Lactulose  20 GM/30ML SOLN Take 30-60 mLs (20-40 g total) by mouth 2 (two) times daily as needed (severe constipation). 946 mL 3   latanoprost  (XALATAN ) 0.005 % ophthalmic solution Place 1 drop into both eyes daily.     metFORMIN  (GLUCOPHAGE ) 500 MG tablet TAKE 1 TABLET BY MOUTH THREE TIMES A DAY 270 tablet 3   mupirocin  ointment (BACTROBAN ) 2 % On leg wound w/dressing change qd or bid 30 g 0   pantoprazole  (PROTONIX ) 40 MG tablet TAKE 1 TABLET BY MOUTH EVERY DAY 90 tablet 3   Propylene Glycol 0.6 % SOLN Place 1 drop into the left eye 2  (two) times daily as needed (for dry eyes).     repaglinide  (PRANDIN ) 2 MG tablet TAKE 1 TABLET BY MOUTH 3 TIMES DAILY BEFORE MEALS 270 tablet 3   simvastatin  (ZOCOR ) 10 MG tablet Take 1 tablet (10 mg total) by mouth daily. 90 tablet 3   Tiotropium Bromide-Olodaterol (STIOLTO RESPIMAT ) 2.5-2.5 MCG/ACT AERS Inhale 2 puffs into the lungs daily. 4 g 0   vitamin B-12 (CYANOCOBALAMIN ) 250 MCG tablet Take 2 tablets (500 mcg total) by mouth daily.     Vitamin D , Ergocalciferol , (DRISDOL ) 1.25 MG (50000 UNIT) CAPS capsule TAKE 1 CAPSULE (50,000 UNITS TOTAL) BY MOUTH EVERY 7 (SEVEN) DAYS 5 capsule 12   gabapentin  (NEURONTIN ) 100 MG capsule TAKE 1 CAPSULE (100 MG TOTAL) BY MOUTH 3 TIMES A DAY 270 capsule 3   No facility-administered medications prior to visit.    ROS: Review of Systems  Objective:  BP 118/70   Pulse 63   Temp 98 F (36.7 C)   Ht 5' 8 (1.727 m)   Wt 204 lb (92.5 kg)   SpO2 97%   BMI 31.02 kg/m   BP Readings from Last 3 Encounters:  07/18/24 118/70  05/15/24 128/60  04/14/24 118/70    Wt Readings from Last 3 Encounters:  07/18/24  204 lb (92.5 kg)  05/15/24 201 lb 12.8 oz (91.5 kg)  04/14/24 197 lb (89.4 kg)    Physical Exam  Lab Results  Component Value Date   WBC 7.8 02/01/2024   HGB 14.0 02/01/2024   HCT 44.0 02/01/2024   PLT 276 02/01/2024   GLUCOSE 112 (H) 04/14/2024   CHOL 109 02/01/2024   TRIG 85 02/01/2024   HDL 47 02/01/2024   LDLDIRECT 52 02/01/2024   LDLCALC 45 02/01/2024   ALT 15 04/14/2024   AST 20 04/14/2024   NA 140 04/14/2024   K 3.5 04/14/2024   CL 103 04/14/2024   CREATININE 1.51 (H) 04/14/2024   BUN 23 04/14/2024   CO2 27 04/14/2024   TSH 9.700 (H) 02/01/2024   PSA 1.18 07/02/2017   INR 2.3 03/03/2012   HGBA1C 6.4 04/14/2024    CT Chest High Resolution Result Date: 03/17/2024 CLINICAL DATA:  Chronic shortness of breath with exertion. EXAM: CT CHEST WITHOUT CONTRAST TECHNIQUE: Multidetector CT imaging of the chest was performed  following the standard protocol without intravenous contrast. High resolution imaging of the lungs, as well as inspiratory and expiratory imaging, was performed. RADIATION DOSE REDUCTION: This exam was performed according to the departmental dose-optimization program which includes automated exposure control, adjustment of the mA and/or kV according to patient size and/or use of iterative reconstruction technique. COMPARISON:  06/06/2020. FINDINGS: Cardiovascular: Atherosclerotic calcification of the aorta and aortic valve. Enlarged pulmonic trunk and heart. No pericardial effusion. Mediastinum/Nodes: No pathologically enlarged mediastinal or axillary lymph nodes. Hilar regions are difficult to definitively evaluate without IV contrast. Esophagus is grossly unremarkable. Lungs/Pleura: Peripheral and basilar predominant subpleural reticulation, coarsened ground-glass and traction bronchiectasis/bronchiolectasis, progressive from 06/06/2020. Possible single layer honeycombing in the lateral right middle lobe and lateral right lower lobe. No pleural fluid. Minimal adherent debris in the airway. Mild air trapping. Upper Abdomen: There may be a small right renal sinus cyst. No specific follow-up necessary. Visualized portions of the liver, gallbladder, adrenal glands, kidneys, spleen, pancreas, stomach and bowel are otherwise grossly unremarkable. No upper abdominal adenopathy. Musculoskeletal: Degenerative changes in the spine. IMPRESSION: 1. Pulmonary parenchymal pattern of fibrosis, as detailed above, progressive from 06/06/2020. Findings are categorized as probable UIP per consensus guidelines: Diagnosis of Idiopathic Pulmonary Fibrosis: An Official ATS/ERS/JRS/ALAT Clinical Practice Guideline. Am JINNY [employer] Crit Care Med Vol 198, Iss 5, [phone], Jul 17 2017. 2. Mild air trapping is indicative of small airways disease. 3.  Aortic atherosclerosis (ICD10-I70.0). 4. Enlarged pulmonic trunk, indicative of pulmonary  arterial hypertension. Electronically Signed   By: Newell Eke M.D.   On: 03/17/2024 13:28    Assessment & Plan:   Problem List Items Addressed This Visit     Acute respiratory failure with hypoxia (HCC)   B12 deficiency   On B12      Relevant Orders   Vitamin B12   Back pain   Increase Gabapentin  to 300 mg bid      Class 2 severe obesity due to excess calories with serious comorbidity and body mass index (BMI) of 35.0 to 35.9 in adult [facility]) - Primary   CRI (chronic renal insufficiency), stage 3 (moderate) (HCC)    Monitor GFR.       Relevant Orders   Comprehensive metabolic panel with GFR   Essential hypertension   Continue on Toprol , Furosemide       Exudative age-related macular degeneration of right eye with active choroidal neovascularization (HCC)   Neuropathy, diabetic (HCC)   Increase Gabapentin  to  300 mg bid      Relevant Orders   Hemoglobin A1c      Meds ordered this encounter  Medications   gabapentin  (NEURONTIN ) 300 MG capsule    Sig: Take 1 capsule (300 mg total) by mouth 2 (two) times daily.    Dispense:  180 capsule    Refill:  1      Follow-up: Return in about 3 months (around 10/17/2024) for a follow-up visit.  Marolyn Noel, MD

## 2024-07-18 NOTE — Assessment & Plan Note (Signed)
Continue on Toprol, Furosemide

## 2024-07-19 ENCOUNTER — Encounter: Payer: Self-pay | Admitting: Pulmonary Disease

## 2024-07-19 ENCOUNTER — Ambulatory Visit: Admitting: Pulmonary Disease

## 2024-07-19 VITALS — BP 118/80 | HR 65 | Temp 98.2°F | Ht 68.0 in | Wt 206.0 lb

## 2024-07-19 DIAGNOSIS — J849 Interstitial pulmonary disease, unspecified: Secondary | ICD-10-CM | POA: Diagnosis not present

## 2024-07-19 NOTE — Patient Instructions (Signed)
  VISIT SUMMARY: Today, we discussed your worsening shortness of breath and reviewed your history of pulmonary fibrosis. We also talked about your cardiac history, sleep patterns, and past infections. The main focus was on managing your pulmonary fibrosis and planning the next steps for your care.  YOUR PLAN: -IDIOPATHIC PULMONARY FIBROSIS WITH PROGRESSIVE DYSPNEA: Idiopathic Pulmonary Fibrosis (IPF) is a condition where the lungs become scarred over time, leading to difficulty breathing. Your recent CT scan shows that the scarring has progressed. We will order blood tests to rule out autoimmune conditions and lung function tests to compare with your 2021 results. Depending on these results, we may discuss starting antifibrotic medications, which can slow the progression of the disease but are not a cure.  INSTRUCTIONS: Please complete the blood tests and lung function tests as soon as possible. We will review the results and discuss the next steps, including the possibility of starting antifibrotic medications, at your follow-up appointment.

## 2024-07-19 NOTE — Progress Notes (Signed)
 [name]    [id number]    1934/11/19  Primary Care Physician:[name], Karlynn GAILS, MD  Referring Physician: Okey Vina GAILS, MD [address] [place],  [place] [address]  Chief complaint: Consult for interstitial lung disease  HPI: 89 y.o. who  has a past medical history of Acute diastolic CHF (congestive heart failure) (HCC) (08/04/2020), Arthritis, Atrial fibrillation (HCC), CARCINOMA, SKIN, SQUAMOUS CELL (01/07/2010), Cataract, COLONIC POLYPS, HX OF (04/26/2007), Coronary artery disease, DIABETES MELLITUS, TYPE II (04/26/2007), Hyperlipidemia, HYPERTENSION (04/26/2007), NEPHROLITHIASIS, HX OF (04/26/2007), Nuclear sclerotic cataract of right eye (10/02/2021), SBO (small bowel obstruction) (HCC) (10/2015), Shortness of breath dyspnea, and Unspecified hearing loss (12/18/2008).  Discussed the use of AI scribe software for clinical note transcription with the patient, who gave verbal consent to proceed.  History of Present Illness [name] is an 89 year old male with pulmonary fibrosis who presents with worsening shortness of breath.  Dyspnea and functional limitation - Progressive shortness of breath over several years, worsening gradually - Significant difficulty walking, especially on inclines - Quickly fatigues and 'gives out' with exertion - Shortness of breath present for most of his life, exacerbated by atrial fibrillation in the past - Feels capable of doing activities but quickly realizes his limitations  Pulmonary fibrosis - Diagnosed approximately three to four years ago - Scarring noted in the lower part of the right lung on imaging - CT scan in May 2025 showed lung scarring with some progression compared to before  Cardiac history and symptoms - History of atrial fibrillation, previously treated with amiodarone  (discontinued earlier this year) - Currently on Eliquis  for anticoagulation - History of coronary artery disease with prior bypass  surgery - Congestive heart failure contributing to fluid around the lungs and worsening dyspnea  Sleep and fatigue - Daytime fatigue present - Sleeps approximately five hours at night, which he considers satisfactory  Infectious disease history - Hospitalized for COVID-19 infection in December 2020 - Treated with IV steroids and remdesivir  during hospitalization  Relevant Pulmonary history: Pets: Dog 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  No feather pillows or comforter No h/o chemo/XRT/amiodarone /macrodantin/MTX  No exposure to asbestos, silica or other organic allergens  Smoking history: 60-pack-year smoker.  Quit in 1982 Travel history: No significant travel history Family history: No family history of lung disease  Outpatient Encounter Medications as of 07/19/2024  Medication Sig   Accu-Chek Softclix Lancets lancets Use to check blood sugar daily DX:E11.59   albuterol  (VENTOLIN  HFA) 108 (90 Base) MCG/ACT inhaler TAKE 2 PUFFS BY MOUTH EVERY 6 HOURS AS NEEDED FOR WHEEZE OR SHORTNESS OF BREATH   apixaban  (ELIQUIS ) 2.5 MG TABS tablet Take 1 tablet (2.5 mg total) by mouth 2 (two) times daily.   bisacodyl  (DULCOLAX) 5 MG EC tablet Take 5 mg by mouth daily as needed for moderate constipation.   Blood Glucose Monitoring Suppl (ACCU-CHEK AVIVA PLUS) w/Device KIT Use to check blood sugar daily DX:E11.59   cholecalciferol  (VITAMIN D ) 1000 units tablet Take 2,000 Units by mouth daily.   docusate sodium  (COLACE) 100 MG capsule Take 200 mg by mouth at bedtime.   ferrous sulfate  325 (65 FE) MG tablet Take 1 tablet (325 mg total) by mouth 2 (two) times daily with a meal.   furosemide  (LASIX ) 40 MG tablet TAKE 2 TABLETS (80 MG TOTAL) BY MOUTH DAILY. CAN TAKE AN EXTRA 1/2 TABLET BY MOUTH ONCE A WEEK.   gabapentin  (NEURONTIN ) 300 MG capsule  Take 1 capsule (300 mg total) by mouth 2 (two) times daily.   glucose blood (ACCU-CHEK AVIVA PLUS) test strip Use to check blood  sugar daily DX:E11.59   Lactulose  20 GM/30ML SOLN Take 30-60 mLs (20-40 g total) by mouth 2 (two) times daily as needed (severe constipation).   latanoprost  (XALATAN ) 0.005 % ophthalmic solution Place 1 drop into both eyes daily.   metFORMIN  (GLUCOPHAGE ) 500 MG tablet TAKE 1 TABLET BY MOUTH THREE TIMES A DAY   mupirocin  ointment (BACTROBAN ) 2 % On leg wound w/dressing change qd or bid   pantoprazole  (PROTONIX ) 40 MG tablet TAKE 1 TABLET BY MOUTH EVERY DAY   Propylene Glycol 0.6 % SOLN Place 1 drop into the left eye 2 (two) times daily as needed (for dry eyes).   repaglinide  (PRANDIN ) 2 MG tablet TAKE 1 TABLET BY MOUTH 3 TIMES DAILY BEFORE MEALS   simvastatin  (ZOCOR ) 10 MG tablet Take 1 tablet (10 mg total) by mouth daily.   Tiotropium Bromide-Olodaterol (STIOLTO RESPIMAT ) 2.5-2.5 MCG/ACT AERS Inhale 2 puffs into the lungs daily.   vitamin B-12 (CYANOCOBALAMIN ) 250 MCG tablet Take 2 tablets (500 mcg total) by mouth daily.   Vitamin D , Ergocalciferol , (DRISDOL ) 1.25 MG (50000 UNIT) CAPS capsule TAKE 1 CAPSULE (50,000 UNITS TOTAL) BY MOUTH EVERY 7 (SEVEN) DAYS   No facility-administered encounter medications on file as of 07/19/2024.     Physical Exam: Today's Vitals   07/19/24 1525  BP: 118/80  Pulse: 65  Temp: 98.2 F (36.8 C)  TempSrc: Oral  SpO2: 95%  Weight: 206 lb (93.4 kg)  Height: 5' 8 (1.727 m)   Body mass index is 31.32 kg/m.  Physical Exam GEN: No acute distress CV: Regular rate and rhythm no murmurs LUNGS: Clear to auscultation bilaterally normal respiratory effort SKIN JOINTS: Warm and dry no rash    Data Reviewed: Imaging: High resolution CT 03/10/2024-progressive pattern of pulmonary fibrosis and probable UIP pattern.  Worse compared to 2021 I have reviewed the images personally  PFTs: 07/31/2020 FVC 2.42 [69%], FEV1 2.07 [82%], F/F86, TLC 4.70 [70%], DLCO 11.68 [51%] Mild restriction, moderate diffusion defect  Labs:  Assessment & Plan Idiopathic  pulmonary fibrosis with progressive dyspnea Idiopathic pulmonary fibrosis (IPF) with progressive dyspnea, initially considered secondary to COVID-19 infection in December 2020. May 2025 CT scan indicates lung scarring and progression. Differential diagnosis includes autoimmune conditions such as lupus or rheumatoid arthritis, but no clinical evidence was found. The condition is slowly progressing with significant exertional dyspnea. Discussed the unpredictable nature of IPF progression and potential future need for oxygen therapy. Antifibrotic medications can slow progression but are not curative and have side effects. At 89 years old, the benefit of these medications may be limited. The goal of antifibrotic therapy is to delay the need for oxygen therapy. - Order blood tests to rule out autoimmune conditions such as connective tissue disease. - Order lung function tests to compare with 2021 results. - Discuss antifibrotic medications if tests rule out other conditions on return visit  Recommendations: CTD serologies, PFTs  Lonna Coder MD [facility] 07/19/2024, 4:02 PM  CC: Okey Vina GAILS, MD

## 2024-07-20 ENCOUNTER — Other Ambulatory Visit

## 2024-07-20 DIAGNOSIS — J849 Interstitial pulmonary disease, unspecified: Secondary | ICD-10-CM | POA: Diagnosis not present

## 2024-07-24 LAB — ANTI-NUCLEAR AB-TITER (ANA TITER): ANA Titer 1: 1:40 {titer} — ABNORMAL HIGH

## 2024-07-24 LAB — ANA,IFA RA DIAG PNL W/RFLX TIT/PATN
Anti Nuclear Antibody (ANA): POSITIVE — AB
Cyclic Citrullin Peptide Ab: <16 U
Rheumatoid fact SerPl-aCnc: <16 U (ref <14)

## 2024-07-25 ENCOUNTER — Ambulatory Visit: Payer: Self-pay | Admitting: Pulmonary Disease

## 2024-07-26 ENCOUNTER — Ambulatory Visit: Payer: Self-pay | Admitting: Internal Medicine

## 2024-07-30 DIAGNOSIS — H353122 Nonexudative age-related macular degeneration, left eye, intermediate dry stage: Secondary | ICD-10-CM | POA: Diagnosis not present

## 2024-08-29 DIAGNOSIS — H353122 Nonexudative age-related macular degeneration, left eye, intermediate dry stage: Secondary | ICD-10-CM | POA: Diagnosis not present

## 2024-09-04 ENCOUNTER — Emergency Department (HOSPITAL_COMMUNITY): Admission: EM | Admit: 2024-09-04 | Discharge: 2024-09-04 | Discharge status: Against Medical Advice

## 2024-09-04 ENCOUNTER — Other Ambulatory Visit: Payer: Self-pay

## 2024-09-04 ENCOUNTER — Encounter (HOSPITAL_COMMUNITY): Payer: Self-pay | Admitting: Emergency Medicine

## 2024-09-04 ENCOUNTER — Ambulatory Visit: Payer: Self-pay

## 2024-09-04 DIAGNOSIS — R509 Fever, unspecified: Secondary | ICD-10-CM | POA: Insufficient documentation

## 2024-09-04 DIAGNOSIS — M549 Dorsalgia, unspecified: Secondary | ICD-10-CM | POA: Diagnosis not present

## 2024-09-04 DIAGNOSIS — Z5321 Procedure and treatment not carried out due to patient leaving prior to being seen by health care provider: Secondary | ICD-10-CM | POA: Insufficient documentation

## 2024-09-04 DIAGNOSIS — R531 Weakness: Secondary | ICD-10-CM | POA: Insufficient documentation

## 2024-09-04 DIAGNOSIS — R300 Dysuria: Secondary | ICD-10-CM | POA: Diagnosis not present

## 2024-09-04 DIAGNOSIS — R319 Hematuria, unspecified: Secondary | ICD-10-CM | POA: Diagnosis not present

## 2024-09-04 LAB — CBC
HCT: 44.3 % (ref 39.0–52.0)
Hemoglobin: 14.4 g/dL (ref 13.0–17.0)
MCH: 31.2 pg (ref 26.0–34.0)
MCHC: 32.5 g/dL (ref 30.0–36.0)
MCV: 96.1 fL (ref 80.0–100.0)
Platelets: 249 K/uL (ref 150–400)
RBC: 4.61 MIL/uL (ref 4.22–5.81)
RDW: 13.7 % (ref 11.5–15.5)
WBC: 19.7 K/uL — ABNORMAL HIGH (ref 4.0–10.5)
nRBC: 0 % (ref 0.0–0.2)

## 2024-09-04 LAB — BASIC METABOLIC PANEL WITH GFR
Anion gap: 15 (ref 5–15)
BUN: 23 mg/dL (ref 8–23)
CO2: 23 mmol/L (ref 22–32)
Calcium: 9.7 mg/dL (ref 8.9–10.3)
Chloride: 100 mmol/L (ref 98–111)
Creatinine, Ser: 1.62 mg/dL — ABNORMAL HIGH (ref 0.61–1.24)
GFR, Estimated: 41 mL/min — ABNORMAL LOW (ref >60)
Glucose, Bld: 177 mg/dL — ABNORMAL HIGH (ref 70–99)
Potassium: 3.9 mmol/L (ref 3.5–5.1)
Sodium: 137 mmol/L (ref 135–145)

## 2024-09-04 LAB — URINALYSIS, ROUTINE W REFLEX MICROSCOPIC
Bilirubin Urine: NEGATIVE
Glucose, UA: NEGATIVE mg/dL
Ketones, ur: NEGATIVE mg/dL
Nitrite: NEGATIVE
Protein, ur: 100 mg/dL — AB
RBC / HPF: >50 RBC/hpf (ref 0–5)
Specific Gravity, Urine: 1.016 (ref 1.005–1.030)
WBC, UA: >50 WBC/hpf (ref 0–5)
pH: 5 (ref 5.0–8.0)

## 2024-09-04 NOTE — Telephone Encounter (Signed)
 FYI Only or Action Required?: FYI only for provider.  Patient was last seen in primary care on 07/18/2024 by [name], Karlynn GAILS, MD.  Called Nurse Triage reporting Urinary Frequency.  Interventions attempted: Nothing.  Symptoms are: unchanged.  Triage Disposition: Go to ED Now (or PCP Triage)  Patient/caregiver understands and will follow disposition?: Unsure  Copied from CRM #[id number]. Topic: Clinical - Red Word Triage >> Sep 04, 2024 11:15 AM [name] wrote: Red Word that prompted transfer to Nurse Triage: Patient states he has a UTI-he has a hard time urinating, pain with urination, burning and stinging.  Patient is shaking in pain Reason for Disposition  Patient sounds very sick or weak to the triager  Answer Assessment - Initial Assessment Questions 1. SEVERITY: How bad is the pain?  (e.g., Scale 1-10; mild, moderate, or severe)     Painful urination, shaking d/t pain. Pt states that he is urinating often and that at times he cannot urinate. Pt advised to go to ED, states he will not call ambulance and pay $350. Pt states that he may try calling his daughter to give him a ride.  Protocols used: Urination Pain - Male-A-AH

## 2024-09-04 NOTE — ED Triage Notes (Signed)
 Patient presents due to dysuria today. He also developed a fever and weakness. Later today,  he also noticed hematuria. He also complains of back pain. He has had kidney stones before, but reports this feels different.

## 2024-09-04 NOTE — ED Notes (Signed)
 Pt has decided to leave due to long wait.

## 2024-09-05 ENCOUNTER — Ambulatory Visit: Payer: Self-pay

## 2024-09-05 ENCOUNTER — Encounter: Payer: Self-pay | Admitting: Internal Medicine

## 2024-09-05 NOTE — Telephone Encounter (Signed)
 FYI Only or Action Required?: Action required by provider: request for appointment.  Patient was last seen in primary care on 07/18/2024 by [name], Karlynn GAILS, MD.  Called Nurse Triage reporting Fatigue.  Symptoms began yesterday.  Interventions attempted: Nothing.  Symptoms are: gradually worsening.  Triage Disposition: Go to ED Now (or PCP Triage)  Patient/caregiver understands and will follow disposition?: No, refuses disposition  PT left ER AMA yesterday. Please advise pt on next steps.      Copied from CRM [id number]. Topic: Clinical - Red Word Triage >> Sep 05, 2024 11:24 AM Macario HERO wrote: Red Word that prompted transfer to Nurse Triage: Patient daughter Donny called, said her father went to the ER per instructions from nurse triage. Patient was able to get labs done but not seen by a doctor. Patient went home after 5 hours of waiting and his Donny is calling to schedule an appointment. Patient is currently extremely weak and has a fever. Reason for Disposition  Patient sounds very sick or weak to the triager  Answer Assessment - Initial Assessment Questions Pt was triaged yesterday and was advised to go to the ER. He was there for 5 hours and they did labs but pt was never evaluated by a provider. Daughter states he was getting weaker and weaker and almost fell out of his chair. Pt told her to take him home so he could rest. She states he is still having the same symptoms at yesterday, pain with urination, frequency, weakness, low grade fever (99.5). She states when she went over there this am she had to pick him up off the floor because he was so weak he couldn't stand out of bed and slipped out when trying to get out of bed. RN advised the current recommendations would be to return to the ER but understand with unpleasant visit yesterday he likely wouldn't do that. She stated he would not. Rn advised would send this message to Dr. Garald to see what he would like for patient to  do. She stated understanding and appreciation.  Protocols used: Urination Pain - Male-A-AH

## 2024-09-10 ENCOUNTER — Other Ambulatory Visit: Payer: Self-pay | Admitting: Internal Medicine

## 2024-09-10 MED ORDER — HYDROCODONE-ACETAMINOPHEN 5-325 MG PO TABS: 0.5000 | ORAL_TABLET | Freq: Four times a day (QID) | ORAL | 0 refills | Status: DC | PRN

## 2024-09-10 MED ORDER — CEFUROXIME AXETIL 500 MG PO TABS
500.0000 mg | ORAL_TABLET | Freq: Two times a day (BID) | ORAL | 0 refills | Status: DC
Start: 2024-09-10 — End: 2024-09-19

## 2024-09-11 ENCOUNTER — Ambulatory Visit: Admitting: Internal Medicine

## 2024-09-11 NOTE — Telephone Encounter (Signed)
 Addressed.  Please see another message.  Thanks

## 2024-09-14 ENCOUNTER — Ambulatory Visit: Admitting: Internal Medicine

## 2024-09-19 ENCOUNTER — Ambulatory Visit (INDEPENDENT_AMBULATORY_CARE_PROVIDER_SITE_OTHER): Admitting: Internal Medicine

## 2024-09-19 ENCOUNTER — Encounter: Payer: Self-pay | Admitting: Internal Medicine

## 2024-09-19 VITALS — BP 112/62 | HR 83 | Temp 97.8°F | Ht 68.0 in | Wt 205.6 lb

## 2024-09-19 DIAGNOSIS — N390 Urinary tract infection, site not specified: Secondary | ICD-10-CM | POA: Insufficient documentation

## 2024-09-19 DIAGNOSIS — E538 Deficiency of other specified B group vitamins: Secondary | ICD-10-CM

## 2024-09-19 DIAGNOSIS — N10 Acute tubulo-interstitial nephritis: Secondary | ICD-10-CM | POA: Diagnosis not present

## 2024-09-19 DIAGNOSIS — M545 Low back pain, unspecified: Secondary | ICD-10-CM | POA: Diagnosis not present

## 2024-09-19 DIAGNOSIS — Z7984 Long term (current) use of oral hypoglycemic drugs: Secondary | ICD-10-CM

## 2024-09-19 DIAGNOSIS — R300 Dysuria: Secondary | ICD-10-CM

## 2024-09-19 DIAGNOSIS — G8929 Other chronic pain: Secondary | ICD-10-CM

## 2024-09-19 DIAGNOSIS — E119 Type 2 diabetes mellitus without complications: Secondary | ICD-10-CM

## 2024-09-19 DIAGNOSIS — I2510 Atherosclerotic heart disease of native coronary artery without angina pectoris: Secondary | ICD-10-CM

## 2024-09-19 DIAGNOSIS — E1159 Type 2 diabetes mellitus with other circulatory complications: Secondary | ICD-10-CM

## 2024-09-19 MED ORDER — CEFUROXIME AXETIL 500 MG PO TABS: 500.0000 mg | ORAL_TABLET | Freq: Two times a day (BID) | ORAL | 0 refills | Status: DC

## 2024-09-19 NOTE — Assessment & Plan Note (Addendum)
 UTI vs prostatitis Taking AZO prn On Ceftin  - better, Given Rx for another 10 d Obtained UA and Cx Kidneys were OK on 2023 abd CT Renal US  if not better

## 2024-09-19 NOTE — Assessment & Plan Note (Signed)
 On B12

## 2024-09-19 NOTE — Assessment & Plan Note (Signed)
 Using a walker On Gabapentin 

## 2024-09-19 NOTE — Assessment & Plan Note (Signed)
Cont on Metformin, Prandin 

## 2024-09-19 NOTE — Progress Notes (Addendum)
 Subjective:  John ID: [name], male    DOB: Jan 03, 1935  Age: 89 y.o. MRN: [id number]  CC: Medical Management of Chronic Issues (Follow up regarding medications that were placed for John, antibiotic and pain medicine. John still having some burning when urinating and is treating with anitboitic and [name]. Notes they have not had to take the pain medication prescribed as much)   HPI [name] presents for a Follow up regarding medications that were placed for John, antibiotic x 1 week and pain medicine. [name]  250 mg bid x 10 d and [name]. Notes they have not had to take the pain medication prescribed as much  Outpatient Medications Prior to Visit  Medication Sig Dispense Refill   Accu-Chek Softclix Lancets lancets Use to check blood sugar daily DX:E11.59 100 each 3   albuterol  (VENTOLIN  HFA) 108 (90 Base) MCG/ACT inhaler TAKE 2 PUFFS BY MOUTH EVERY 6 HOURS AS NEEDED FOR WHEEZE OR SHORTNESS OF BREATH 6.7 each 2   apixaban  (ELIQUIS ) 2.5 MG TABS tablet Take 1 tablet (2.5 mg total) by mouth 2 (two) times daily. 60 tablet 11   bisacodyl  (DULCOLAX) 5 MG EC tablet Take 5 mg by mouth daily as needed for moderate constipation.     Blood Glucose Monitoring Suppl (ACCU-CHEK AVIVA PLUS) w/Device KIT Use to check blood sugar daily DX:E11.59 1 kit 0   cholecalciferol  (VITAMIN D ) 1000 units tablet Take 2,000 Units by mouth daily.     docusate sodium  (COLACE) 100 MG capsule Take 200 mg by mouth at bedtime.     ferrous sulfate  325 (65 FE) MG tablet Take 1 tablet (325 mg total) by mouth 2 (two) times daily with a meal. 60 tablet 6   furosemide  (LASIX ) 40 MG tablet TAKE 2 TABLETS (80 MG TOTAL) BY MOUTH DAILY. CAN TAKE John EXTRA 1/2 TABLET BY MOUTH ONCE A WEEK. 180 tablet 2   gabapentin  (NEURONTIN ) 300 MG capsule Take 1 capsule (300 mg total) by mouth 2 (two) times daily. 180 capsule 1   glucose blood  (ACCU-CHEK AVIVA PLUS) test strip Use to check blood sugar daily DX:E11.59 100 each 3   Lactulose  20 GM/30ML SOLN Take 30-60 mLs (20-40 g total) by mouth 2 (two) times daily as needed (severe constipation). 946 mL 3   latanoprost  (XALATAN ) 0.005 % ophthalmic solution Place 1 drop into both eyes daily.     metFORMIN  (GLUCOPHAGE ) 500 MG tablet TAKE 1 TABLET BY MOUTH THREE TIMES A DAY 270 tablet 3   mupirocin  ointment (BACTROBAN ) 2 % On leg wound w/dressing change qd or bid 30 g 0   pantoprazole  (PROTONIX ) 40 MG tablet TAKE 1 TABLET BY MOUTH EVERY DAY 90 tablet 3   Propylene Glycol 0.6 % SOLN Place 1 drop into the left eye 2 (two) times daily as needed (for dry eyes).     repaglinide  (PRANDIN ) 2 MG tablet TAKE 1 TABLET BY MOUTH 3 TIMES DAILY BEFORE MEALS 270 tablet 3   simvastatin  (ZOCOR ) 10 MG tablet Take 1 tablet (10 mg total) by mouth daily. 90 tablet 3   Tiotropium Bromide-Olodaterol (STIOLTO RESPIMAT ) 2.5-2.5 MCG/ACT AERS Inhale 2 puffs into the lungs daily. 4 g 0   vitamin B-12 (CYANOCOBALAMIN ) 250 MCG tablet Take 2 tablets (500 mcg total) by mouth daily.     Vitamin D , Ergocalciferol , (DRISDOL ) 1.25 MG (50000 UNIT) CAPS capsule TAKE 1 CAPSULE (50,000 UNITS TOTAL) BY MOUTH EVERY 7 (SEVEN)  DAYS 5 capsule 12   [name]  ([name] ) 500 MG tablet Take 1 tablet (500 mg total) by mouth 2 (two) times daily with a meal for 10 days. 20 tablet 0   [name]  (NORCO/VICODIN) 5-325 MG tablet Take 0.5-1 tablets by mouth every 6 (six) hours as needed. 15 tablet 0   No facility-administered medications prior to visit.    ROS: Review of Systems  Constitutional:  Positive for fatigue. Negative for appetite change and unexpected weight change.  HENT:  Negative for congestion, nosebleeds, sneezing, sore throat and trouble swallowing.   Eyes:  Negative for itching and visual disturbance.  Respiratory:  Negative for cough.   Cardiovascular:  Negative for chest pain, palpitations and leg swelling.   Gastrointestinal:  Negative for abdominal distention, blood in stool, diarrhea and nausea.  Genitourinary:  Positive for dysuria, frequency and urgency. Negative for flank pain and hematuria.  Musculoskeletal:  Positive for arthralgias, back pain and gait problem. Negative for joint swelling and neck pain.  Skin:  Negative for rash.  Neurological:  Positive for weakness. Negative for dizziness, tremors and speech difficulty.  Psychiatric/Behavioral:  Negative for agitation, dysphoric mood and sleep disturbance. The John is not nervous/anxious.     Objective:  BP 112/62   Pulse 83   Temp 97.8 F (36.6 C)   Ht 5' 8 (1.727 m)   Wt 205 lb 9.6 oz (93.3 kg)   SpO2 97%   BMI 31.26 kg/m   BP Readings from Last 3 Encounters:  09/19/24 112/62  09/04/24 124/64  07/19/24 118/80    Wt Readings from Last 3 Encounters:  09/28/24 205 lb (93 kg)  09/19/24 205 lb 9.6 oz (93.3 kg)  07/19/24 206 lb (93.4 kg)    Physical Exam Constitutional:      General: He is not in acute distress.    Appearance: He is well-developed. He is obese. He is not toxic-appearing.     Comments: NAD  Eyes:     Conjunctiva/sclera: Conjunctivae normal.     Pupils: Pupils are equal, round, and reactive to light.  Neck:     Thyroid : No thyromegaly.     Vascular: No JVD.  Cardiovascular:     Rate and Rhythm: Normal rate and regular rhythm.     Heart sounds: Normal heart sounds. No murmur heard.    No friction rub. No gallop.  Pulmonary:     Effort: Pulmonary effort is normal. No respiratory distress.     Breath sounds: Normal breath sounds. No wheezing or rales.  Chest:     Chest wall: No tenderness.  Abdominal:     General: Bowel sounds are normal. There is no distension.     Palpations: Abdomen is soft. There is no mass.     Tenderness: There is no abdominal tenderness. There is no guarding or rebound.  Musculoskeletal:        General: Tenderness present. Normal range of motion.     Cervical back:  Normal range of motion.     Right lower leg: Edema present.     Left lower leg: Edema present.  Lymphadenopathy:     Cervical: No cervical adenopathy.  Skin:    General: Skin is warm and dry.     Findings: No rash.  Neurological:     Mental Status: He is alert and oriented to person, place, and time.     Cranial Nerves: No cranial nerve deficit.     Motor: Weakness present. No abnormal muscle tone.  Coordination: Coordination normal.     Gait: Gait abnormal.     Deep Tendon Reflexes: Reflexes are normal and symmetric.  Psychiatric:        Behavior: Behavior normal.        Thought Content: Thought content normal.        Judgment: Judgment normal.   Using a walker LS spine - pain w/ROM  Lab Results  Component Value Date   WBC 19.7 (H) 09/04/2024   HGB 14.4 09/04/2024   HCT 44.3 09/04/2024   PLT 249 09/04/2024   GLUCOSE 177 (H) 09/04/2024   CHOL 109 02/01/2024   TRIG 85 02/01/2024   HDL 47 02/01/2024   LDLDIRECT 52 02/01/2024   LDLCALC 45 02/01/2024   ALT 17 07/18/2024   AST 19 07/18/2024   NA 137 09/04/2024   K 3.9 09/04/2024   CL 100 09/04/2024   CREATININE 1.62 (H) 09/04/2024   BUN 23 09/04/2024   CO2 23 09/04/2024   TSH 9.700 (H) 02/01/2024   PSA 1.18 07/02/2017   INR 2.3 03/03/2012   HGBA1C 7.1 (H) 07/18/2024    No results found.  Assessment & Plan:   Problem List Items Addressed This Visit     B12 deficiency   On B12      Back pain   Using a walker On Gabapentin       CAD (coronary artery disease), native coronary artery (Chronic)   Cont w/Eliquis , Amlodipine , Lopressor , Zocor       Diabetes mellitus, type 2 (HCC) (Chronic)   Cont on Metformin , Prandin        Dysuria - Primary   UTI vs prostatitis Taking [name] prn On [name]  - better, Given Rx for another 10 d Obtained UA and Cx Kidneys were OK on 2023 abd CT Renal US  if not better      Relevant Orders   CULTURE, URINE COMPREHENSIVE (Completed)   Urinalysis   UTI (urinary tract  infection)   UTI vs prostatitis Taking [name] prn On [name]  - better, Given Rx for another 10 d Obtained UA and Cx Kidneys were OK on 2023 abd CT Renal US  if not better         Meds ordered this encounter  Medications   [name]  ([name] ) 500 MG tablet    Sig: Take 1 tablet (500 mg total) by mouth 2 (two) times daily with a meal for 10 days.    Dispense:  20 tablet    Refill:  0      Follow-up: Return in about 2 months (around 11/19/2024) for a follow-up visit.  Marolyn Noel, MD

## 2024-09-20 ENCOUNTER — Encounter

## 2024-09-20 ENCOUNTER — Ambulatory Visit: Admitting: Pulmonary Disease

## 2024-09-20 LAB — URINALYSIS, ROUTINE W REFLEX MICROSCOPIC
Bilirubin Urine: NEGATIVE
Ketones, ur: NEGATIVE
Nitrite: POSITIVE — AB
Specific Gravity, Urine: 1.015 (ref 1.000–1.030)
Total Protein, Urine: 100 — AB
Urine Glucose: NEGATIVE
Urobilinogen, UA: 0.2 (ref 0.0–1.0)
pH: 6 (ref 5.0–8.0)

## 2024-09-21 ENCOUNTER — Ambulatory Visit: Payer: Self-pay | Admitting: Internal Medicine

## 2024-09-21 DIAGNOSIS — N390 Urinary tract infection, site not specified: Secondary | ICD-10-CM

## 2024-09-22 LAB — CULTURE, URINE COMPREHENSIVE

## 2024-09-28 ENCOUNTER — Encounter: Admitting: Internal Medicine

## 2024-09-28 ENCOUNTER — Ambulatory Visit (INDEPENDENT_AMBULATORY_CARE_PROVIDER_SITE_OTHER)

## 2024-09-28 VITALS — Ht 68.0 in | Wt 205.0 lb

## 2024-09-28 DIAGNOSIS — Z0000 Encounter for general adult medical examination without abnormal findings: Secondary | ICD-10-CM

## 2024-09-28 NOTE — Patient Instructions (Signed)
 [name],  Thank you for taking the time for your Medicare Wellness Visit. I appreciate your continued commitment to your health goals. Please review the care plan we discussed, and feel free to reach out if I can assist you further.  Please note that Annual Wellness Visits do not include a physical exam. Some assessments may be limited, especially if the visit was conducted virtually. If needed, we may recommend an in-person follow-up with your provider.  Ongoing Care Seeing your primary care provider every 3 to 6 months helps us  monitor your health and provide consistent, personalized care. Next office visit on 12/02/20205.  You are due for a foot exam and a Flu vaccine, which can be done during your next office visit.    Referrals If a referral was made during today's visit and you haven't received any updates within two weeks, please contact the referred provider directly to check on the status.  Recommended Screenings:  Health Maintenance  Topic Date Due   Complete foot exam   07/06/2019   COVID-19 Vaccine (3 - Pfizer risk series) 04/16/2020   Flu Shot  06/16/2024   Eye exam for diabetics  06/01/2025   Medicare Annual Wellness Visit  09/28/2025   DTaP/Tdap/Td vaccine (5 - Td or Tdap) 07/15/2032   Pneumococcal Vaccine for age over 86  Completed   Meningitis B Vaccine  Aged Out   Zoster (Shingles) Vaccine  Discontinued       09/28/2024    1:59 PM  Advanced Directives  Does Patient Have a Medical Advance Directive? No  Would patient like information on creating a medical advance directive? No - Patient declined    Vision: Annual vision screenings are recommended for early detection of glaucoma, cataracts, and diabetic retinopathy. These exams can also reveal signs of chronic conditions such as diabetes and high blood pressure.  Dental: Annual dental screenings help detect early signs of oral cancer, gum disease, and other conditions linked to overall health, including  heart disease and diabetes.  Please see the attached documents for additional preventive care recommendations.

## 2024-09-28 NOTE — Progress Notes (Cosign Needed Addendum)
 Chief Complaint  Patient presents with   Medicare Wellness     Subjective:   [name] is a 89 y.o. male who presents for a Medicare Annual Wellness Visit.  Allergies (verified) Patient has no known allergies.   History: Past Medical History:  Diagnosis Date   Acute diastolic CHF (congestive heart failure) (HCC) 08/04/2020   Arthritis    Atrial fibrillation (HCC)    post op; amiodarone  and coumadin  continued for 3 mos post op   CARCINOMA, SKIN, SQUAMOUS CELL 01/07/2010   Cataract    beginning of cataracts   COLONIC POLYPS, HX OF 04/26/2007   Coronary artery disease    s/p CABG 3/12: L-LAD, S-CFX (Dr. Dusty); EF 45% at cath prior to CABG   DIABETES MELLITUS, TYPE II 04/26/2007   Hyperlipidemia    HYPERTENSION 04/26/2007   NEPHROLITHIASIS, HX OF 04/26/2007   Nuclear sclerotic cataract of right eye 10/02/2021   Surgery Dr. Fleeta, 06-03-2022 with posterior capsule disruption posterior dislocation lens, disruption of pupil margin superiorly   SBO (small bowel obstruction) (HCC) 10/2015   Partial    Shortness of breath dyspnea    with exertion   Unspecified hearing loss 12/18/2008   Past Surgical History:  Procedure Laterality Date   BACK SURGERY     disectomy   CARDIOVERSION N/A 06/08/2018   Procedure: CARDIOVERSION;  Surgeon: Lonni Slain, MD;  Location: [facility] ENDOSCOPY;  Service: Cardiovascular;  Laterality: N/A;   CARDIOVERSION N/A 07/25/2018   Procedure: CARDIOVERSION;  Surgeon: Okey Vina GAILS, MD;  Location: [facility] ENDOSCOPY;  Service: Cardiovascular;  Laterality: N/A;   COLONOSCOPY     CORONARY ARTERY BYPASS GRAFT  01/12/2011   LUMBAR LAMINECTOMY/DECOMPRESSION MICRODISCECTOMY N/A 05/08/2015   Procedure: LUMBAR LAMINECTOMY/DECOMPRESSION MICRODISCECTOMY 1 LEVEL Lumbar four five;  Surgeon: Donaciano Sprang, MD;  Location: [facility] OR;  Service: Orthopedics;  Laterality: N/A;   TOTAL HIP ARTHROPLASTY Left    left on 04/15/07   TYMPANIC MEMBRANE REPAIR  1981   Family History   Problem Relation Age of Onset   Heart failure Mother    Heart disease Father    Cancer Sister    Cancer Brother    Heart disease Brother    Breast cancer Sister    Colon cancer Neg Hx    Esophageal cancer Neg Hx    Rectal cancer Neg Hx    Stomach cancer Neg Hx    Social History   Occupational History   Occupation: RETIRED  Tobacco Use   Smoking status: Former    Current packs/day: 0.00    Average packs/day: 2.0 packs/day for 30.0 years (60.0 ttl pk-yrs)    Types: Cigarettes    Start date: 01/02/1951    Quit date: 01/02/1981    Years since quitting: 43.7   Smokeless tobacco: Never  Vaping Use   Vaping status: Never Used  Substance and Sexual Activity   Alcohol use: No   Drug use: No   Sexual activity: Not Currently   Tobacco Counseling Counseling given: Not Answered  SDOH Screenings   Food Insecurity: No Food Insecurity (09/28/2024)  Housing: Unknown (09/28/2024)  Transportation Needs: No Transportation Needs (09/28/2024)  Utilities: Not At Risk (09/28/2024)  Alcohol Screen: Low Risk  (09/24/2023)  Depression (PHQ2-9): Low Risk  (09/28/2024)  Recent Concern: Depression (PHQ2-9) - High Risk (07/18/2024)  Financial Resource Strain: Low Risk  (09/24/2023)  Physical Activity: Inactive (09/28/2024)  Social Connections: Socially Integrated (09/28/2024)  Stress: No Stress Concern Present (09/28/2024)  Tobacco Use: Medium Risk (  09/28/2024)  Health Literacy: Adequate Health Literacy (09/28/2024)   See flowsheets for full screening details  Depression Screen PHQ 2 & 9 Depression Scale- Over the past 2 weeks, how often have you been bothered by any of the following problems? Little interest or pleasure in doing things: 0 Feeling down, depressed, or hopeless (PHQ Adolescent also includes...irritable): 0 PHQ-2 Total Score: 0 Trouble falling or staying asleep, or sleeping too much: 0 Feeling tired or having little energy: 0 Poor appetite or overeating (PHQ Adolescent also  includes...weight loss): 0 Feeling bad about yourself - or that you are a failure or have let yourself or your family down: 0 Trouble concentrating on things, such as reading the newspaper or watching television (PHQ Adolescent also includes...like school work): 0 Moving or speaking so slowly that other people could have noticed. Or the opposite - being so fidgety or restless that you have been moving around a lot more than usual: 0 Thoughts that you would be better off dead, or of hurting yourself in some way: 0 PHQ-9 Total Score: 0 If you checked off any problems, how difficult have these problems made it for you to do your work, take care of things at home, or get along with other people?: Not difficult at all  Depression Treatment Depression Interventions/Treatment : EYV7-0 Score <4 Follow-up Not Indicated     Goals Addressed   None    Visit info / Clinical Intake: Medicare Wellness Visit Type:: Subsequent Annual Wellness Visit Persons participating in visit:: patient Medicare Wellness Visit Mode:: Telephone If telephone:: video declined Because this visit was a virtual/telehealth visit:: vitals recorded from last visit If Telephone or Video please confirm:: I connected with the patient using audio enabled telemedicine application and verified that I am speaking with the correct person using two identifiers; I discussed the limitations of evaluation and management by telemedicine; The patient expressed understanding and agreed to proceed Patient Location:: Home Provider Location:: Home Information given by:: patient Interpreter Needed?: No Pre-visit prep was completed: no AWV questionnaire completed by patient prior to visit?: no Living arrangements:: lives with spouse/significant other Patient's Overall Health Status Rating: good Typical amount of pain: none Does pain affect daily life?: no Are you currently prescribed opioids?: no  Dietary Habits and Nutritional Risks How  many meals a day?: (!) 1 Eats fruit and vegetables daily?: (!) no Most meals are obtained by: preparing own meals In the last 2 weeks, have you had any of the following?: none Diabetic:: (!) yes Any non-healing wounds?: no How often do you check your BS?: as needed Would you like to be referred to a Nutritionist or for Diabetic Management? : no  Functional Status Activities of Daily Living (to include ambulation/medication): Independent Ambulation: Independent with device- listed below Home Assistive Devices/Equipment: Walker (specify Type); Eyeglasses Medication Administration: Independent Home Management: Independent Manage your own finances?: yes Primary transportation is: driving Concerns about vision?: (!) yes (Exudative age-related macular degeneration of right eye) Concerns about hearing?: no  Fall Screening Falls in the past year?: 1 Number of falls in past year: 1 Was there an injury with Fall?: 0 Fall Risk Category Calculator: 2 Patient Fall Risk Level: Moderate Fall Risk  Fall Risk Patient at Risk for Falls Due to: Impaired balance/gait Fall risk Follow up: Falls evaluation completed; Falls prevention discussed  Home and Transportation Safety: All rugs have non-skid backing?: N/A, no rugs All stairs or steps have railings?: N/A, no stairs Grab bars in the bathtub or shower?: yes Have  non-skid surface in bathtub or shower?: yes Good home lighting?: yes Regular seat belt use?: yes Hospital stays in the last year:: no  Cognitive Assessment Difficulty concentrating, remembering, or making decisions? : yes Will 6CIT or Mini Cog be Completed: yes What year is it?: 0 points What month is it?: 0 points Give patient an address phrase to remember (5 components): [address], [place] About what time is it?: 0 points Count backwards from 20 to 1: 0 points Say the months of the year in reverse: 0 points Repeat the address phrase from earlier: 0 points 6 CIT Score: 0  points  Advance Directives (For Healthcare) Does Patient Have a Medical Advance Directive?: No Would patient like information on creating a medical advance directive?: No - Patient declined  Reviewed/Updated  Reviewed/Updated: Reviewed All (Medical, Surgical, Family, Medications, Allergies, Care Teams, Patient Goals)        Objective:    Today's Vitals   09/28/24 1353  Weight: 205 lb (93 kg)  Height: 5' 8 (1.727 m)   Body mass index is 31.17 kg/m.  Current Medications (verified) Outpatient Encounter Medications as of 09/28/2024  Medication Sig   Accu-Chek Softclix Lancets lancets Use to check blood sugar daily DX:E11.59   albuterol  (VENTOLIN  HFA) 108 (90 Base) MCG/ACT inhaler TAKE 2 PUFFS BY MOUTH EVERY 6 HOURS AS NEEDED FOR WHEEZE OR SHORTNESS OF BREATH   apixaban  (ELIQUIS ) 2.5 MG TABS tablet Take 1 tablet (2.5 mg total) by mouth 2 (two) times daily.   bisacodyl  (DULCOLAX) 5 MG EC tablet Take 5 mg by mouth daily as needed for moderate constipation.   Blood Glucose Monitoring Suppl (ACCU-CHEK AVIVA PLUS) w/Device KIT Use to check blood sugar daily DX:E11.59   cefUROXime  (CEFTIN ) 500 MG tablet Take 1 tablet (500 mg total) by mouth 2 (two) times daily with a meal for 10 days.   cholecalciferol  (VITAMIN D ) 1000 units tablet Take 2,000 Units by mouth daily.   docusate sodium  (COLACE) 100 MG capsule Take 200 mg by mouth at bedtime.   ferrous sulfate  325 (65 FE) MG tablet Take 1 tablet (325 mg total) by mouth 2 (two) times daily with a meal.   furosemide  (LASIX ) 40 MG tablet TAKE 2 TABLETS (80 MG TOTAL) BY MOUTH DAILY. CAN TAKE AN EXTRA 1/2 TABLET BY MOUTH ONCE A WEEK.   gabapentin  (NEURONTIN ) 300 MG capsule Take 1 capsule (300 mg total) by mouth 2 (two) times daily.   glucose blood (ACCU-CHEK AVIVA PLUS) test strip Use to check blood sugar daily DX:E11.59   Lactulose  20 GM/30ML SOLN Take 30-60 mLs (20-40 g total) by mouth 2 (two) times daily as needed (severe constipation).    latanoprost  (XALATAN ) 0.005 % ophthalmic solution Place 1 drop into both eyes daily.   metFORMIN  (GLUCOPHAGE ) 500 MG tablet TAKE 1 TABLET BY MOUTH THREE TIMES A DAY   mupirocin  ointment (BACTROBAN ) 2 % On leg wound w/dressing change qd or bid   pantoprazole  (PROTONIX ) 40 MG tablet TAKE 1 TABLET BY MOUTH EVERY DAY   Propylene Glycol 0.6 % SOLN Place 1 drop into the left eye 2 (two) times daily as needed (for dry eyes).   repaglinide  (PRANDIN ) 2 MG tablet TAKE 1 TABLET BY MOUTH 3 TIMES DAILY BEFORE MEALS   simvastatin  (ZOCOR ) 10 MG tablet Take 1 tablet (10 mg total) by mouth daily.   Tiotropium Bromide-Olodaterol (STIOLTO RESPIMAT ) 2.5-2.5 MCG/ACT AERS Inhale 2 puffs into the lungs daily.   vitamin B-12 (CYANOCOBALAMIN ) 250 MCG tablet Take 2  tablets (500 mcg total) by mouth daily.   Vitamin D , Ergocalciferol , (DRISDOL ) 1.25 MG (50000 UNIT) CAPS capsule TAKE 1 CAPSULE (50,000 UNITS TOTAL) BY MOUTH EVERY 7 (SEVEN) DAYS   No facility-administered encounter medications on file as of 09/28/2024.   Hearing/Vision screen Hearing Screening - Comments:: Denies hearing difficulties   Vision Screening - Comments:: Wears eyeglasses/Dr. Mel Immunizations and Health Maintenance Health Maintenance  Topic Date Due   FOOT EXAM  07/06/2019   COVID-19 Vaccine (3 - Pfizer risk series) 04/16/2020   Influenza Vaccine  06/16/2024   Medicare Annual Wellness (AWV)  09/23/2024   OPHTHALMOLOGY EXAM  06/01/2025   DTaP/Tdap/Td (5 - Td or Tdap) 07/15/2032   Pneumococcal Vaccine: 50+ Years  Completed   Meningococcal B Vaccine  Aged Out   Zoster Vaccines- Shingrix  Discontinued        Assessment/Plan:  This is a routine wellness examination for [name].  Patient Care Team: [name], Karlynn GAILS, MD as PCP - General (Internal Medicine) Okey Vina GAILS, MD as PCP - Cardiology (Cardiology) Burnetta Aures, MD as Consulting Physician (Orthopedic Surgery) Bonner Ade, MD as Consulting Physician (Physical  Medicine and Rehabilitation) Burnetta Aures, MD as Consulting Physician (Orthopedic Surgery) Szabat, Toribio BROCKS, [facility] (Inactive) as Pharmacist (Pharmacist) Jarold Mayo, MD as Consulting Physician (Ophthalmology)  I have personally reviewed and noted the following in the patient's chart:   Medical and social history Use of alcohol, tobacco or illicit drugs  Current medications and supplements including opioid prescriptions. Functional ability and status Nutritional status Physical activity Advanced directives List of other physicians Hospitalizations, surgeries, and ER visits in previous 12 months Vitals Screenings to include cognitive, depression, and falls Referrals and appointments  No orders of the defined types were placed in this encounter.  In addition, I have reviewed and discussed with patient certain preventive protocols, quality metrics, and best practice recommendations. A written personalized care plan for preventive services as well as general preventive health recommendations were provided to patient.   Debbra Digiulio L Mikalah Skyles, CMA   09/28/2024   No follow-ups on file.  After Visit Summary: (MyChart) Due to this being a telephonic visit, the after visit summary with patients personalized plan was offered to patient via MyChart   Nurse Notes: Patient is due for a foot exam and a Flu vaccine, which can be done during his next office visit.  He has had his 2025 diabetic eye exam and records are in patient's chart. Patient is up to date on all other health maintenance with no concerns to address today.  Medical screening examination/treatment/procedure(s) were performed by non-physician practitioner and as supervising physician I was immediately available for consultation/collaboration.  I agree with above. Karlynn Noel, MD

## 2024-10-02 NOTE — Assessment & Plan Note (Signed)
 Cont w/Eliquis, Amlodipine, Lopressor, Zocor

## 2024-10-04 ENCOUNTER — Telehealth: Payer: Self-pay | Admitting: Internal Medicine

## 2024-10-04 NOTE — Telephone Encounter (Signed)
 Left message to call back.

## 2024-10-04 NOTE — Telephone Encounter (Signed)
 Pt c/o medication issue:  1. Name of Medication:   apixaban  (ELIQUIS ) 2.5 MG TABS tablet    2. How are you currently taking this medication (dosage and times per day)?  Take 1 tablet (2.5 mg total) by mouth 2 (two) times daily.      3. Are you having a reaction (difficulty breathing--STAT)? No  4. What is your medication issue? Pt is requesting a callback regarding him wanting to discuss getting a generic brand of this medication since he stated he can't afford this Tier 3 medication. Please advise.

## 2024-10-05 ENCOUNTER — Other Ambulatory Visit (HOSPITAL_COMMUNITY): Payer: Self-pay

## 2024-10-05 ENCOUNTER — Telehealth: Payer: Self-pay | Admitting: Pharmacy Technician

## 2024-10-05 ENCOUNTER — Telehealth: Payer: Self-pay | Admitting: Internal Medicine

## 2024-10-05 NOTE — Telephone Encounter (Signed)
 Grant approved. Given to pharmacy and pt

## 2024-10-05 NOTE — Telephone Encounter (Signed)
 Pt c/o medication issue:   1. Name of Medication: apixaban  (ELIQUIS ) 2.5 MG TABS tablet    2. How are you currently taking this medication (dosage and times per day) as written   3. Are you having a reaction (difficulty breathing--STAT)? no   4. What is your medication issue?  Pt calling requesting cheaper alternative of this med.

## 2024-10-05 NOTE — Telephone Encounter (Signed)
 Patient Advocate Encounter   The patient was approved for a [employer] grant that will help cover the cost of eliquis  Total amount awarded, [id number].  Effective: 09/05/24 - 09/04/25   APW:[id number] ERW:[id number] Hmnle:[id number] PI:[id number] [employer] ID: [id number]   Pharmacy provided with approval and processing information. Patient informed via mychart

## 2024-10-08 MED ORDER — CIPROFLOXACIN HCL 250 MG PO TABS: 250.0000 mg | ORAL_TABLET | Freq: Two times a day (BID) | ORAL | 0 refills | Status: AC

## 2024-10-17 ENCOUNTER — Encounter: Payer: Self-pay | Admitting: Internal Medicine

## 2024-10-17 ENCOUNTER — Ambulatory Visit: Admitting: Internal Medicine

## 2024-10-17 VITALS — BP 126/60 | HR 75 | Ht 68.0 in | Wt 202.6 lb

## 2024-10-17 DIAGNOSIS — Z6835 Body mass index (BMI) 35.0-35.9, adult: Secondary | ICD-10-CM

## 2024-10-17 DIAGNOSIS — E66812 Obesity, class 2: Secondary | ICD-10-CM | POA: Diagnosis not present

## 2024-10-17 DIAGNOSIS — N10 Acute tubulo-interstitial nephritis: Secondary | ICD-10-CM

## 2024-10-17 DIAGNOSIS — R300 Dysuria: Secondary | ICD-10-CM

## 2024-10-17 DIAGNOSIS — I10 Essential (primary) hypertension: Secondary | ICD-10-CM

## 2024-10-17 DIAGNOSIS — I5031 Acute diastolic (congestive) heart failure: Secondary | ICD-10-CM

## 2024-10-17 NOTE — Progress Notes (Signed)
 Subjective:  Patient ID: [name], male    DOB: Oct 25, 1935  Age: 89 y.o. MRN: [id number]  CC: Medical Management of Chronic Issues (3 Month follow up)   HPI [name] presents for A fib, UTI on abx 20 d UTI vs prostatitis w/Pseudomonas aeruginosa on 10/19/24 Was on Ceftin  plus another 10 d Kidneys were OK on 2023 abd CT       Outpatient Medications Prior to Visit  Medication Sig Dispense Refill   Accu-Chek Softclix Lancets lancets Use to check blood sugar daily DX:E11.59 100 each 3   albuterol  (VENTOLIN  HFA) 108 (90 Base) MCG/ACT inhaler TAKE 2 PUFFS BY MOUTH EVERY 6 HOURS AS NEEDED FOR WHEEZE OR SHORTNESS OF BREATH 6.7 each 2   apixaban  (ELIQUIS ) 2.5 MG TABS tablet Take 1 tablet (2.5 mg total) by mouth 2 (two) times daily. 60 tablet 11   bisacodyl  (DULCOLAX) 5 MG EC tablet Take 5 mg by mouth daily as needed for moderate constipation.     Blood Glucose Monitoring Suppl (ACCU-CHEK AVIVA PLUS) w/Device KIT Use to check blood sugar daily DX:E11.59 1 kit 0   cholecalciferol  (VITAMIN D ) 1000 units tablet Take 2,000 Units by mouth daily.     ciprofloxacin  (CIPRO ) 250 MG tablet Take 1 tablet (250 mg total) by mouth 2 (two) times daily. 28 tablet 0   docusate sodium  (COLACE) 100 MG capsule Take 200 mg by mouth at bedtime.     ferrous sulfate  325 (65 FE) MG tablet Take 1 tablet (325 mg total) by mouth 2 (two) times daily with a meal. 60 tablet 6   furosemide  (LASIX ) 40 MG tablet TAKE 2 TABLETS (80 MG TOTAL) BY MOUTH DAILY. CAN TAKE AN EXTRA 1/2 TABLET BY MOUTH ONCE A WEEK. 180 tablet 2   gabapentin  (NEURONTIN ) 300 MG capsule Take 1 capsule (300 mg total) by mouth 2 (two) times daily. 180 capsule 1   glucose blood (ACCU-CHEK AVIVA PLUS) test strip Use to check blood sugar daily DX:E11.59 100 each 3   Lactulose  20 GM/30ML SOLN Take 30-60 mLs (20-40 g total) by mouth 2 (two) times daily as needed (severe constipation). 946 mL 3   latanoprost  (XALATAN ) 0.005 % ophthalmic  solution Place 1 drop into both eyes daily.     metFORMIN  (GLUCOPHAGE ) 500 MG tablet TAKE 1 TABLET BY MOUTH THREE TIMES A DAY 270 tablet 3   mupirocin  ointment (BACTROBAN ) 2 % On leg wound w/dressing change qd or bid 30 g 0   pantoprazole  (PROTONIX ) 40 MG tablet TAKE 1 TABLET BY MOUTH EVERY DAY 90 tablet 3   Propylene Glycol 0.6 % SOLN Place 1 drop into the left eye 2 (two) times daily as needed (for dry eyes).     repaglinide  (PRANDIN ) 2 MG tablet TAKE 1 TABLET BY MOUTH 3 TIMES DAILY BEFORE MEALS 270 tablet 3   simvastatin  (ZOCOR ) 10 MG tablet Take 1 tablet (10 mg total) by mouth daily. 90 tablet 3   Tiotropium Bromide-Olodaterol (STIOLTO RESPIMAT ) 2.5-2.5 MCG/ACT AERS Inhale 2 puffs into the lungs daily. 4 g 0   vitamin B-12 (CYANOCOBALAMIN ) 250 MCG tablet Take 2 tablets (500 mcg total) by mouth daily.     Vitamin D , Ergocalciferol , (DRISDOL ) 1.25 MG (50000 UNIT) CAPS capsule TAKE 1 CAPSULE (50,000 UNITS TOTAL) BY MOUTH EVERY 7 (SEVEN) DAYS 5 capsule 12   No facility-administered medications prior to visit.    ROS: Review of Systems  Constitutional:  Positive for fatigue. Negative for appetite change and unexpected weight  change.  HENT:  Negative for congestion, nosebleeds, sneezing, sore throat and trouble swallowing.   Eyes:  Negative for itching and visual disturbance.  Respiratory:  Positive for shortness of breath. Negative for cough and chest tightness.   Cardiovascular:  Positive for leg swelling. Negative for chest pain and palpitations.  Gastrointestinal:  Negative for abdominal distention, blood in stool, diarrhea and nausea.  Genitourinary:  Negative for frequency and hematuria.  Musculoskeletal:  Positive for back pain and gait problem. Negative for joint swelling and neck pain.  Skin:  Negative for rash.  Neurological:  Negative for dizziness, tremors, speech difficulty and weakness.  Psychiatric/Behavioral:  Negative for agitation, dysphoric mood and sleep disturbance. The  patient is not nervous/anxious.     Objective:  BP 126/60   Pulse 75   Ht 5' 8 (1.727 m)   Wt 202 lb 9.6 oz (91.9 kg)   SpO2 99%   BMI 30.81 kg/m   BP Readings from Last 3 Encounters:  10/17/24 126/60  09/19/24 112/62  09/04/24 124/64    Wt Readings from Last 3 Encounters:  10/17/24 202 lb 9.6 oz (91.9 kg)  09/28/24 205 lb (93 kg)  09/19/24 205 lb 9.6 oz (93.3 kg)    Physical Exam Constitutional:      General: He is not in acute distress.    Appearance: He is well-developed. He is obese.     Comments: NAD  Eyes:     Conjunctiva/sclera: Conjunctivae normal.     Pupils: Pupils are equal, round, and reactive to light.  Neck:     Thyroid : No thyromegaly.     Vascular: No JVD.  Cardiovascular:     Rate and Rhythm: Normal rate and regular rhythm.     Heart sounds: Normal heart sounds. No murmur heard.    No friction rub. No gallop.  Pulmonary:     Effort: Pulmonary effort is normal. No respiratory distress.     Breath sounds: Normal breath sounds. No wheezing or rales.  Chest:     Chest wall: No tenderness.  Abdominal:     General: Bowel sounds are normal. There is no distension.     Palpations: Abdomen is soft. There is no mass.     Tenderness: There is no abdominal tenderness. There is no guarding or rebound.  Musculoskeletal:        General: No tenderness. Normal range of motion.     Cervical back: Normal range of motion.  Lymphadenopathy:     Cervical: No cervical adenopathy.  Skin:    General: Skin is warm and dry.     Findings: Bruising present. No rash.  Neurological:     Mental Status: He is alert and oriented to person, place, and time.     Cranial Nerves: No cranial nerve deficit.     Motor: Weakness present. No abnormal muscle tone.     Coordination: Coordination abnormal.     Gait: Gait abnormal.     Deep Tendon Reflexes: Reflexes are normal and symmetric.  Psychiatric:        Behavior: Behavior normal.        Thought Content: Thought content  normal.        Judgment: Judgment normal.    LLE 1+ edema - not new, R - trace Arthritic gait  Lab Results  Component Value Date   WBC 19.7 (H) 09/04/2024   HGB 14.4 09/04/2024   HCT 44.3 09/04/2024   PLT 249 09/04/2024   GLUCOSE 177 (H) 09/04/2024  CHOL 109 02/01/2024   TRIG 85 02/01/2024   HDL 47 02/01/2024   LDLDIRECT 52 02/01/2024   LDLCALC 45 02/01/2024   ALT 17 07/18/2024   AST 19 07/18/2024   NA 137 09/04/2024   K 3.9 09/04/2024   CL 100 09/04/2024   CREATININE 1.62 (H) 09/04/2024   BUN 23 09/04/2024   CO2 23 09/04/2024   TSH 9.700 (H) 02/01/2024   PSA 1.18 07/02/2017   INR 2.3 03/03/2012   HGBA1C 7.1 (H) 07/18/2024    No results found.  Assessment & Plan:   Problem List Items Addressed This Visit     Acute diastolic CHF (congestive heart failure) (HCC)   Better after we treated UTI vs prostatitis w/Pseudomonas aeruginosa on 10/19/24 Was on Ceftin  plus another 10 d SOB is better On Lasix       Class 2 severe obesity due to excess calories with serious comorbidity and body mass index (BMI) of 35.0 to 35.9 in adult   Wt Readings from Last 3 Encounters:  10/17/24 202 lb 9.6 oz (91.9 kg)  09/28/24 205 lb (93 kg)  09/19/24 205 lb 9.6 oz (93.3 kg)  cont w/wt loss       Dysuria   UTI vs prostatitis w/Pseudomonas aeruginosa on 10/19/24 Was on Ceftin  plus another 10 d Kidneys were OK on 2023 abd CT      Essential hypertension   Continue on Toprol , Furosemide       UTI (urinary tract infection) - Primary   UTI vs prostatitis w/Pseudomonas aeruginosa on 10/19/24 Was on Ceftin  plus another 10 d Kidneys were OK on 2023 abd CT      Relevant Orders   Comprehensive metabolic panel with GFR   Urinalysis   CULTURE, URINE COMPREHENSIVE      No orders of the defined types were placed in this encounter.     Follow-up: No follow-ups on file.  Marolyn Noel, MD

## 2024-10-17 NOTE — Assessment & Plan Note (Signed)
 UTI vs prostatitis w/Pseudomonas aeruginosa on 10/19/24 Was on Ceftin  plus another 10 d Kidneys were OK on 2023 abd CT

## 2024-10-17 NOTE — Assessment & Plan Note (Signed)
 Better after we treated UTI vs prostatitis w/Pseudomonas aeruginosa on 10/19/24 Was on Ceftin  plus another 10 d SOB is better On Lasix 

## 2024-10-17 NOTE — Assessment & Plan Note (Signed)
 Wt Readings from Last 3 Encounters:  10/17/24 202 lb 9.6 oz (91.9 kg)  09/28/24 205 lb (93 kg)  09/19/24 205 lb 9.6 oz (93.3 kg)  cont w/wt loss

## 2024-10-17 NOTE — Assessment & Plan Note (Signed)
Continue on Toprol, Furosemide

## 2024-10-19 ENCOUNTER — Ambulatory Visit
Admission: RE | Admit: 2024-10-19 | Discharge: 2024-10-19 | Disposition: A | Source: Ambulatory Visit | Attending: Internal Medicine | Admitting: Internal Medicine

## 2024-10-19 DIAGNOSIS — N390 Urinary tract infection, site not specified: Secondary | ICD-10-CM

## 2024-10-19 NOTE — Telephone Encounter (Signed)
 See 10/05/24 telephone note.

## 2024-10-25 ENCOUNTER — Other Ambulatory Visit: Payer: Self-pay | Admitting: Internal Medicine

## 2024-10-27 ENCOUNTER — Ambulatory Visit: Payer: Self-pay | Admitting: Internal Medicine

## 2024-10-31 ENCOUNTER — Telehealth: Payer: Self-pay | Admitting: Pharmacy Technician

## 2024-10-31 NOTE — Telephone Encounter (Signed)
 I called [facility] and re-gave them the hw grant again   Effective: 09/05/24 - 09/04/25   APW:[id number] ERW:[id number] Hmnle:[id number] PI:[id number]    Eliquis  is now free -pt aware

## 2024-11-07 ENCOUNTER — Ambulatory Visit (INDEPENDENT_AMBULATORY_CARE_PROVIDER_SITE_OTHER)

## 2024-11-07 DIAGNOSIS — J849 Interstitial pulmonary disease, unspecified: Secondary | ICD-10-CM | POA: Diagnosis not present

## 2024-11-07 LAB — PULMONARY FUNCTION TEST
DL/VA % pred: 52 %
DL/VA: 1.99 ml/min/mmHg/L
DLCO unc % pred: 40 %
DLCO unc: 8.94 ml/min/mmHg
FEF 25-75 Post: 2.68 L/s
FEF 25-75 Pre: 1.9 L/s
FEF2575-%Change-Post: 41 %
FEF2575-%Pred-Post: 186 %
FEF2575-%Pred-Pre: 131 %
FEV1-%Change-Post: 5 %
FEV1-%Pred-Post: 96 %
FEV1-%Pred-Pre: 90 %
FEV1-Post: 2.24 L
FEV1-Pre: 2.11 L
FEV1FVC-%Change-Post: 9 %
FEV1FVC-%Pred-Pre: 112 %
FEV6-%Change-Post: -1 %
FEV6-%Pred-Post: 83 %
FEV6-%Pred-Pre: 84 %
FEV6-Post: 2.61 L
FEV6-Pre: 2.65 L
FEV6FVC-%Change-Post: 1 %
FEV6FVC-%Pred-Post: 108 %
FEV6FVC-%Pred-Pre: 107 %
FVC-%Change-Post: -2 %
FVC-%Pred-Post: 77 %
FVC-%Pred-Pre: 79 %
FVC-Post: 2.61 L
FVC-Pre: 2.69 L
Post FEV1/FVC ratio: 86 %
Post FEV6/FVC ratio: 100 %
Pre FEV1/FVC ratio: 79 %
Pre FEV6/FVC Ratio: 99 %
RV % pred: 80 %
RV: 2.17 L
TLC % pred: 70 %
TLC: 4.73 L

## 2024-11-07 NOTE — Patient Instructions (Signed)
 Full pft performed today

## 2024-11-07 NOTE — Progress Notes (Signed)
 Full pft performed today

## 2024-11-23 ENCOUNTER — Ambulatory Visit: Admitting: Internal Medicine

## 2024-11-27 ENCOUNTER — Other Ambulatory Visit: Payer: Self-pay

## 2024-11-27 ENCOUNTER — Other Ambulatory Visit: Payer: Self-pay | Admitting: Internal Medicine

## 2024-11-27 ENCOUNTER — Encounter: Payer: Self-pay | Admitting: Internal Medicine

## 2024-11-27 MED ORDER — METFORMIN HCL 500 MG PO TABS: 500.0000 mg | ORAL_TABLET | Freq: Three times a day (TID) | ORAL | 3 refills | Status: AC

## 2024-11-27 NOTE — Telephone Encounter (Unsigned)
 Copied from CRM #[id number]. Topic: Clinical - Medication Refill >> Nov 27, 2024  3:47 PM Deaijah H wrote: Medication: metFORMIN  (GLUCOPHAGE ) 500 MG tablet  Has the patient contacted their pharmacy? Yes (Agent: If no, request that the patient contact the pharmacy for the refill. If patient does not wish to contact the pharmacy document the reason why and proceed with request.) Pharmacy haven't received (Agent: If yes, when and what did the pharmacy advise?)  This is the patient's preferred pharmacy:  [id number] [place], [place] - [address] [facility] [address] [place] [address] Phone: [phone] Fax: [phone]   Is this the correct pharmacy for this prescription? Yes If no, delete pharmacy and type the correct one.   Has the prescription been filled recently? No  Is the patient out of the medication? Yes  Has the patient been seen for an appointment in the last year OR does the patient have an upcoming appointment? Yes  Can we respond through MyChart? Yes  Agent: Please be advised that Rx refills may take up to 3 business days. We ask that you follow-up with your pharmacy.

## 2024-11-29 ENCOUNTER — Encounter: Payer: Self-pay | Admitting: Primary Care

## 2024-11-29 ENCOUNTER — Ambulatory Visit: Admitting: Primary Care

## 2024-11-29 VITALS — BP 120/62 | HR 80 | Temp 97.4°F | Ht 68.0 in | Wt 203.8 lb

## 2024-11-29 DIAGNOSIS — J84112 Idiopathic pulmonary fibrosis: Secondary | ICD-10-CM

## 2024-11-29 DIAGNOSIS — Z87891 Personal history of nicotine dependence: Secondary | ICD-10-CM

## 2024-11-29 DIAGNOSIS — J849 Interstitial pulmonary disease, unspecified: Secondary | ICD-10-CM

## 2024-11-29 LAB — HEPATIC FUNCTION PANEL
ALT: 11 U/L (ref 3–53)
AST: 15 U/L (ref 5–37)
Albumin: 4.1 g/dL (ref 3.5–5.2)
Alkaline Phosphatase: 65 U/L (ref 39–117)
Bilirubin, Direct: 0.2 mg/dL (ref 0.1–0.3)
Total Bilirubin: 0.8 mg/dL (ref 0.2–1.2)
Total Protein: 7.7 g/dL (ref 6.0–8.3)
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
## 2024-11-29 NOTE — Patient Instructions (Addendum)
" ° °  VISIT SUMMARY: Today, we discussed your condition of idiopathic pulmonary fibrosis, which has shown some progression over the past few years. We reviewed your recent CT scan and breathing tests, and talked about starting a new medication to help slow down the progression of the disease.  YOUR PLAN: -IDIOPATHIC PULMONARY FIBROSIS: Idiopathic pulmonary fibrosis is a lung disease that causes scarring of the lung tissue, leading to a gradual loss of lung function. We have decided to start you on an antifibrotic medication (Esbriet) to help slow the progression of the disease. You will need to have liver function tests done today and then monthly for the next six months to monitor for any potential side effects. We will also repeat your pulmonary function test in three months to see how your lungs are doing. Please report any side effects such as diarrhea, nausea, vomiting, or weight loss. If you experience diarrhea, you can take over-the-counter Imodium to help manage it.  INSTRUCTIONS: Please arrange a phone call with the specialty pharmacy to initiate your new medication. Make sure to get your hepatic function panel done today and then monthly for the next six months. We will see you again in three months for a follow-up pulmonary function test. Report any side effects you experience, and take over-the-counter Imodium if you have diarrhea.  Orders: Pharmacy consult Labs today Recheck BP  Follow-up 3 months with 30 min PFT prior with Dr. Theophilus     "

## 2024-12-07 ENCOUNTER — Telehealth: Payer: Self-pay

## 2024-12-07 NOTE — Telephone Encounter (Signed)
 Received referral for new start Esbriet for progressive IPF. Opening benefits investigation in this thread.

## 2024-12-08 ENCOUNTER — Other Ambulatory Visit (HOSPITAL_COMMUNITY): Payer: Self-pay

## 2024-12-08 NOTE — Telephone Encounter (Signed)
 Submitted a Prior Authorization request to [facility] for PIRFENIDONE via CoverMyMeds. Authorization has been CANCELLED due to no pa required     Test Claim revealed that a 30 day supply has a copay of $1238.15. Patient can fill through [facility]: [phone]   Authorization#: [id number]   Pt enrolled in Pulmonary Fibrosis grant through PAF:  Amount: $5000 Award Period: 727/25 - 12/08/25 BIN: [id number] PCN: [id number] Group: [id number] ID: [id number]  For pharmacy inquiries, contact PDMI at [phone]. For patient inquiries, contact PAF at [phone].

## 2024-12-11 NOTE — Telephone Encounter (Signed)
 ATC patient at [phone]. Left detailed, HIPAA compliant VM requesting return call. I made note that he can leave me the best days/times of day to reach him if he gest my voicemail to avoid further phone tag.

## 2024-12-13 NOTE — Telephone Encounter (Signed)
 Second attempt to reach patient at [phone]. Left detailed, HIPAA compliant VM requesting return call.

## 2025-01-16 ENCOUNTER — Ambulatory Visit: Admitting: Internal Medicine

## 2025-02-27 ENCOUNTER — Ambulatory Visit: Admitting: Pulmonary Disease

## 2025-02-27 ENCOUNTER — Encounter
